# Patient Record
Sex: Female | Born: 1940 | Race: Black or African American | Hispanic: No | Marital: Married | Smoking: Never smoker
Health system: Southern US, Community
[De-identification: ages and names within clinical notes are randomized; demographics above are authoritative.]

## PROBLEM LIST (undated history)

## (undated) DIAGNOSIS — E782 Mixed hyperlipidemia: Secondary | ICD-10-CM

## (undated) DIAGNOSIS — I10 Essential (primary) hypertension: Secondary | ICD-10-CM

## (undated) DIAGNOSIS — I2510 Atherosclerotic heart disease of native coronary artery without angina pectoris: Secondary | ICD-10-CM

## (undated) DIAGNOSIS — I4719 Other supraventricular tachycardia: Secondary | ICD-10-CM

## (undated) DIAGNOSIS — I071 Rheumatic tricuspid insufficiency: Secondary | ICD-10-CM

## (undated) DIAGNOSIS — N184 Chronic kidney disease, stage 4 (severe): Secondary | ICD-10-CM

## (undated) DIAGNOSIS — M899 Disorder of bone, unspecified: Secondary | ICD-10-CM

## (undated) DIAGNOSIS — H409 Unspecified glaucoma: Secondary | ICD-10-CM

## (undated) DIAGNOSIS — M48061 Spinal stenosis, lumbar region without neurogenic claudication: Secondary | ICD-10-CM

## (undated) DIAGNOSIS — D649 Anemia, unspecified: Secondary | ICD-10-CM

## (undated) DIAGNOSIS — I340 Nonrheumatic mitral (valve) insufficiency: Secondary | ICD-10-CM

## (undated) DIAGNOSIS — I480 Paroxysmal atrial fibrillation: Secondary | ICD-10-CM

## (undated) DIAGNOSIS — D631 Anemia in chronic kidney disease: Secondary | ICD-10-CM

## (undated) DIAGNOSIS — J189 Pneumonia, unspecified organism: Secondary | ICD-10-CM

## (undated) DIAGNOSIS — K635 Polyp of colon: Secondary | ICD-10-CM

## (undated) DIAGNOSIS — M949 Disorder of cartilage, unspecified: Secondary | ICD-10-CM

## (undated) DIAGNOSIS — C50311 Malignant neoplasm of lower-inner quadrant of right female breast: Secondary | ICD-10-CM

## (undated) DIAGNOSIS — I5032 Chronic diastolic (congestive) heart failure: Secondary | ICD-10-CM

## (undated) DIAGNOSIS — I38 Endocarditis, valve unspecified: Secondary | ICD-10-CM

## (undated) DIAGNOSIS — I351 Nonrheumatic aortic (valve) insufficiency: Secondary | ICD-10-CM

## (undated) DIAGNOSIS — M069 Rheumatoid arthritis, unspecified: Secondary | ICD-10-CM

## (undated) DIAGNOSIS — I495 Sick sinus syndrome: Secondary | ICD-10-CM

## (undated) DIAGNOSIS — I471 Supraventricular tachycardia: Secondary | ICD-10-CM

## (undated) DIAGNOSIS — N2581 Secondary hyperparathyroidism of renal origin: Secondary | ICD-10-CM

## (undated) DIAGNOSIS — N9489 Other specified conditions associated with female genital organs and menstrual cycle: Secondary | ICD-10-CM

## (undated) DIAGNOSIS — Z7901 Long term (current) use of anticoagulants: Secondary | ICD-10-CM

## (undated) DIAGNOSIS — Z8679 Personal history of other diseases of the circulatory system: Secondary | ICD-10-CM

## (undated) DIAGNOSIS — D696 Thrombocytopenia, unspecified: Secondary | ICD-10-CM

## (undated) DIAGNOSIS — M5416 Radiculopathy, lumbar region: Secondary | ICD-10-CM

## (undated) DIAGNOSIS — I6523 Occlusion and stenosis of bilateral carotid arteries: Secondary | ICD-10-CM

## (undated) HISTORY — DX: Rheumatic tricuspid insufficiency: I07.1

## (undated) HISTORY — PX: CARDIAC CATHETERIZATION: SHX172

## (undated) HISTORY — DX: Unspecified glaucoma: H40.9

## (undated) HISTORY — DX: Other supraventricular tachycardia: I47.19

## (undated) HISTORY — DX: Endocarditis, valve unspecified: I38

## (undated) HISTORY — DX: Nonrheumatic mitral (valve) insufficiency: I34.0

## (undated) HISTORY — DX: Pneumonia, unspecified organism: J18.9

## (undated) HISTORY — DX: Mixed hyperlipidemia: E78.2

## (undated) HISTORY — DX: Sick sinus syndrome: I49.5

## (undated) HISTORY — DX: Disorder of bone, unspecified: M89.9

## (undated) HISTORY — DX: Rheumatoid arthritis, unspecified: M06.9

## (undated) HISTORY — DX: Supraventricular tachycardia: I47.1

## (undated) HISTORY — DX: Atherosclerotic heart disease of native coronary artery without angina pectoris: I25.10

## (undated) HISTORY — PX: OTHER SURGICAL HISTORY: SHX169

## (undated) HISTORY — DX: Nonrheumatic aortic (valve) insufficiency: I35.1

## (undated) HISTORY — DX: Paroxysmal atrial fibrillation: I48.0

## (undated) HISTORY — DX: Essential (primary) hypertension: I10

## (undated) HISTORY — DX: Anemia, unspecified: D64.9

## (undated) HISTORY — DX: Disorder of cartilage, unspecified: M94.9

## (undated) HISTORY — PX: ABLATION OF DYSRHYTHMIC FOCUS: SHX254

## (undated) MED FILL — Ferumoxytol Inj 510 MG/17ML (30 MG/ML) (Elemental Fe): INTRAVENOUS | Qty: 17 | Status: AC

---

## 1991-11-27 ENCOUNTER — Encounter (INDEPENDENT_AMBULATORY_CARE_PROVIDER_SITE_OTHER): Payer: Self-pay | Admitting: Internal Medicine

## 2005-01-28 ENCOUNTER — Ambulatory Visit: Payer: Self-pay | Admitting: Family Medicine

## 2005-02-03 ENCOUNTER — Ambulatory Visit: Payer: Self-pay | Admitting: Family Medicine

## 2005-02-10 ENCOUNTER — Ambulatory Visit: Payer: Self-pay | Admitting: Family Medicine

## 2005-02-10 ENCOUNTER — Other Ambulatory Visit: Admission: RE | Admit: 2005-02-10 | Discharge: 2005-02-10 | Payer: Self-pay | Admitting: Internal Medicine

## 2005-03-04 ENCOUNTER — Ambulatory Visit: Payer: Self-pay | Admitting: Family Medicine

## 2005-03-09 ENCOUNTER — Ambulatory Visit: Payer: Self-pay | Admitting: Family Medicine

## 2005-04-08 ENCOUNTER — Ambulatory Visit: Payer: Self-pay | Admitting: Family Medicine

## 2005-07-11 ENCOUNTER — Ambulatory Visit: Payer: Self-pay | Admitting: Family Medicine

## 2005-08-10 ENCOUNTER — Ambulatory Visit: Payer: Self-pay | Admitting: Family Medicine

## 2005-08-12 ENCOUNTER — Encounter (INDEPENDENT_AMBULATORY_CARE_PROVIDER_SITE_OTHER): Payer: Self-pay | Admitting: Internal Medicine

## 2005-11-24 ENCOUNTER — Ambulatory Visit: Payer: Self-pay | Admitting: Family Medicine

## 2005-12-08 ENCOUNTER — Ambulatory Visit: Payer: Self-pay | Admitting: Family Medicine

## 2006-02-16 ENCOUNTER — Ambulatory Visit: Payer: Self-pay | Admitting: Family Medicine

## 2006-06-30 ENCOUNTER — Ambulatory Visit: Payer: Self-pay | Admitting: Family Medicine

## 2006-06-30 ENCOUNTER — Other Ambulatory Visit: Admission: RE | Admit: 2006-06-30 | Discharge: 2006-06-30 | Payer: Self-pay | Admitting: Family Medicine

## 2006-07-05 ENCOUNTER — Ambulatory Visit: Payer: Self-pay | Admitting: Family Medicine

## 2006-09-12 ENCOUNTER — Ambulatory Visit: Payer: Self-pay

## 2006-10-10 DIAGNOSIS — H409 Unspecified glaucoma: Secondary | ICD-10-CM | POA: Insufficient documentation

## 2007-01-08 ENCOUNTER — Ambulatory Visit: Payer: Self-pay | Admitting: Family Medicine

## 2007-08-23 ENCOUNTER — Ambulatory Visit: Payer: Self-pay | Admitting: Family Medicine

## 2007-08-23 ENCOUNTER — Encounter (INDEPENDENT_AMBULATORY_CARE_PROVIDER_SITE_OTHER): Payer: Self-pay | Admitting: Internal Medicine

## 2007-08-23 DIAGNOSIS — E782 Mixed hyperlipidemia: Secondary | ICD-10-CM | POA: Insufficient documentation

## 2007-08-28 LAB — CONVERTED CEMR LAB
ALT: 15 units/L (ref 0–35)
BUN: 16 mg/dL (ref 6–23)
Bilirubin, Direct: 0.1 mg/dL (ref 0.0–0.3)
CO2: 28 meq/L (ref 19–32)
Calcium: 10 mg/dL (ref 8.4–10.5)
Direct LDL: 147.5 mg/dL
Eosinophils Absolute: 0.1 10*3/uL (ref 0.0–0.6)
Eosinophils Relative: 2.3 % (ref 0.0–5.0)
GFR calc Af Amer: 93 mL/min
GFR calc non Af Amer: 77 mL/min
Glucose, Bld: 96 mg/dL (ref 70–99)
Hemoglobin: 11.5 g/dL — ABNORMAL LOW (ref 12.0–15.0)
Lymphocytes Relative: 35.6 % (ref 12.0–46.0)
MCV: 91.2 fL (ref 78.0–100.0)
Monocytes Absolute: 0.4 10*3/uL (ref 0.2–0.7)
Monocytes Relative: 6.5 % (ref 3.0–11.0)
Neutro Abs: 3.6 10*3/uL (ref 1.4–7.7)
Platelets: 177 10*3/uL (ref 150–400)
Potassium: 4 meq/L (ref 3.5–5.1)
TSH: 1.32 microintl units/mL (ref 0.35–5.50)
Total CHOL/HDL Ratio: 3.5
Total Protein: 7.8 g/dL (ref 6.0–8.3)
Triglycerides: 70 mg/dL (ref 0–149)
VLDL: 14 mg/dL (ref 0–40)
Vit D, 1,25-Dihydroxy: 27 — ABNORMAL LOW (ref 30–89)
WBC: 6.3 10*3/uL (ref 4.5–10.5)

## 2007-09-14 ENCOUNTER — Ambulatory Visit: Payer: Self-pay | Admitting: Family Medicine

## 2007-10-11 DIAGNOSIS — J189 Pneumonia, unspecified organism: Secondary | ICD-10-CM

## 2007-10-11 HISTORY — DX: Pneumonia, unspecified organism: J18.9

## 2007-10-25 ENCOUNTER — Ambulatory Visit: Payer: Self-pay | Admitting: Family Medicine

## 2007-10-25 ENCOUNTER — Encounter (INDEPENDENT_AMBULATORY_CARE_PROVIDER_SITE_OTHER): Payer: Self-pay | Admitting: Internal Medicine

## 2007-10-29 ENCOUNTER — Encounter (INDEPENDENT_AMBULATORY_CARE_PROVIDER_SITE_OTHER): Payer: Self-pay | Admitting: Internal Medicine

## 2007-10-29 ENCOUNTER — Ambulatory Visit: Payer: Self-pay | Admitting: Family Medicine

## 2007-10-30 ENCOUNTER — Encounter (INDEPENDENT_AMBULATORY_CARE_PROVIDER_SITE_OTHER): Payer: Self-pay | Admitting: *Deleted

## 2007-11-01 ENCOUNTER — Encounter (INDEPENDENT_AMBULATORY_CARE_PROVIDER_SITE_OTHER): Payer: Self-pay | Admitting: *Deleted

## 2007-11-16 ENCOUNTER — Telehealth (INDEPENDENT_AMBULATORY_CARE_PROVIDER_SITE_OTHER): Payer: Self-pay | Admitting: Internal Medicine

## 2007-11-20 ENCOUNTER — Ambulatory Visit: Payer: Self-pay | Admitting: Cardiovascular Disease

## 2007-11-20 ENCOUNTER — Inpatient Hospital Stay (HOSPITAL_COMMUNITY): Admission: EM | Admit: 2007-11-20 | Discharge: 2007-11-23 | Payer: Self-pay | Admitting: Emergency Medicine

## 2007-11-20 IMAGING — CR DG CERVICAL SPINE FLEX&EXT ONLY
3 series · 3 of 3 positions shown · non-contrast
Comparison: none

CLINICAL DATA: Neck pain. Evaluate for subluxation at C1-2. 
 CERVICAL SPINE FLEXION AND EXTENSION ? 3 VIEW:

[w c-spine oblique (1 of 2)]
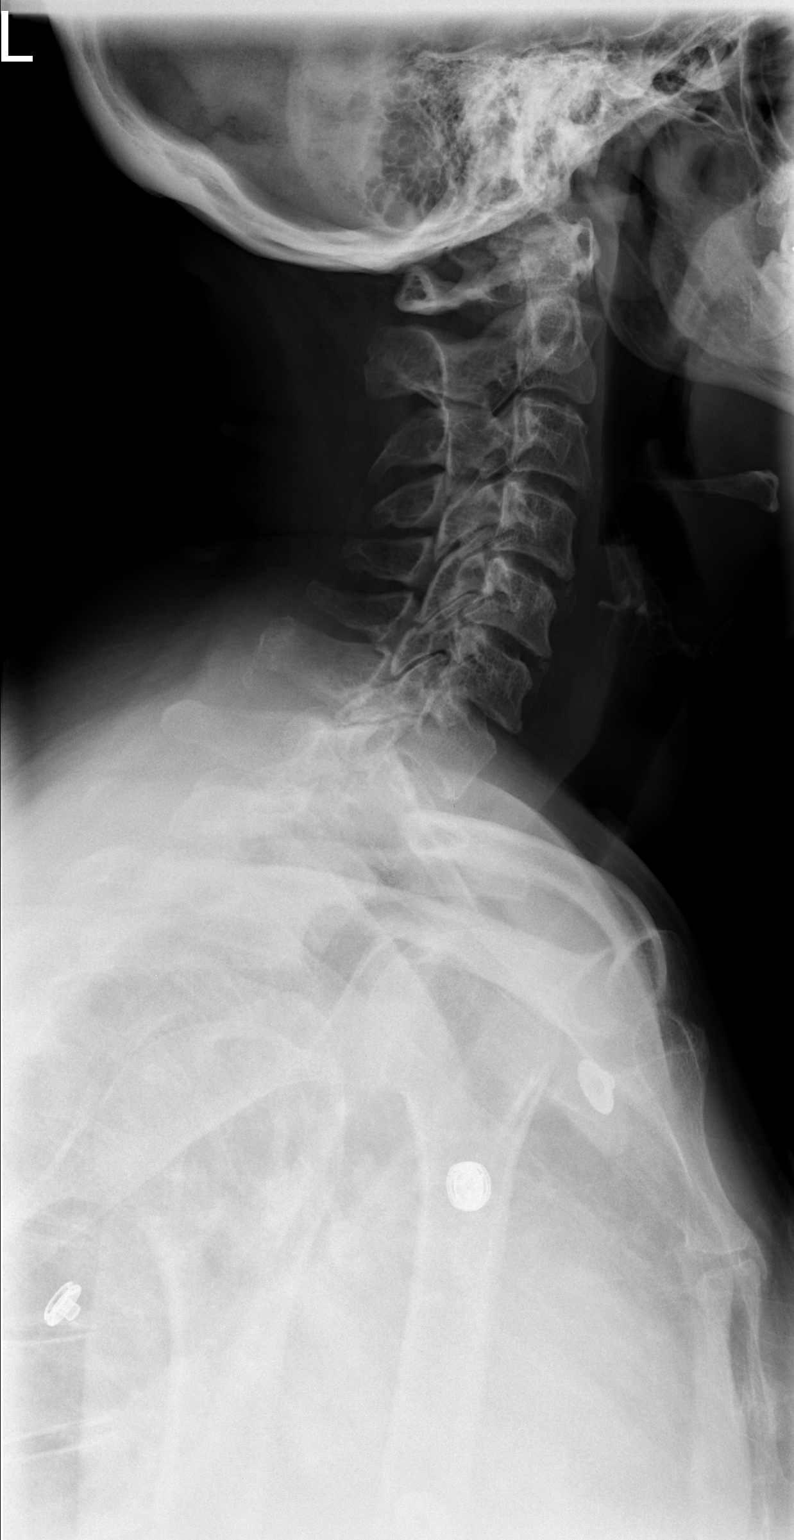

[w c-spine oblique (2 of 2)]
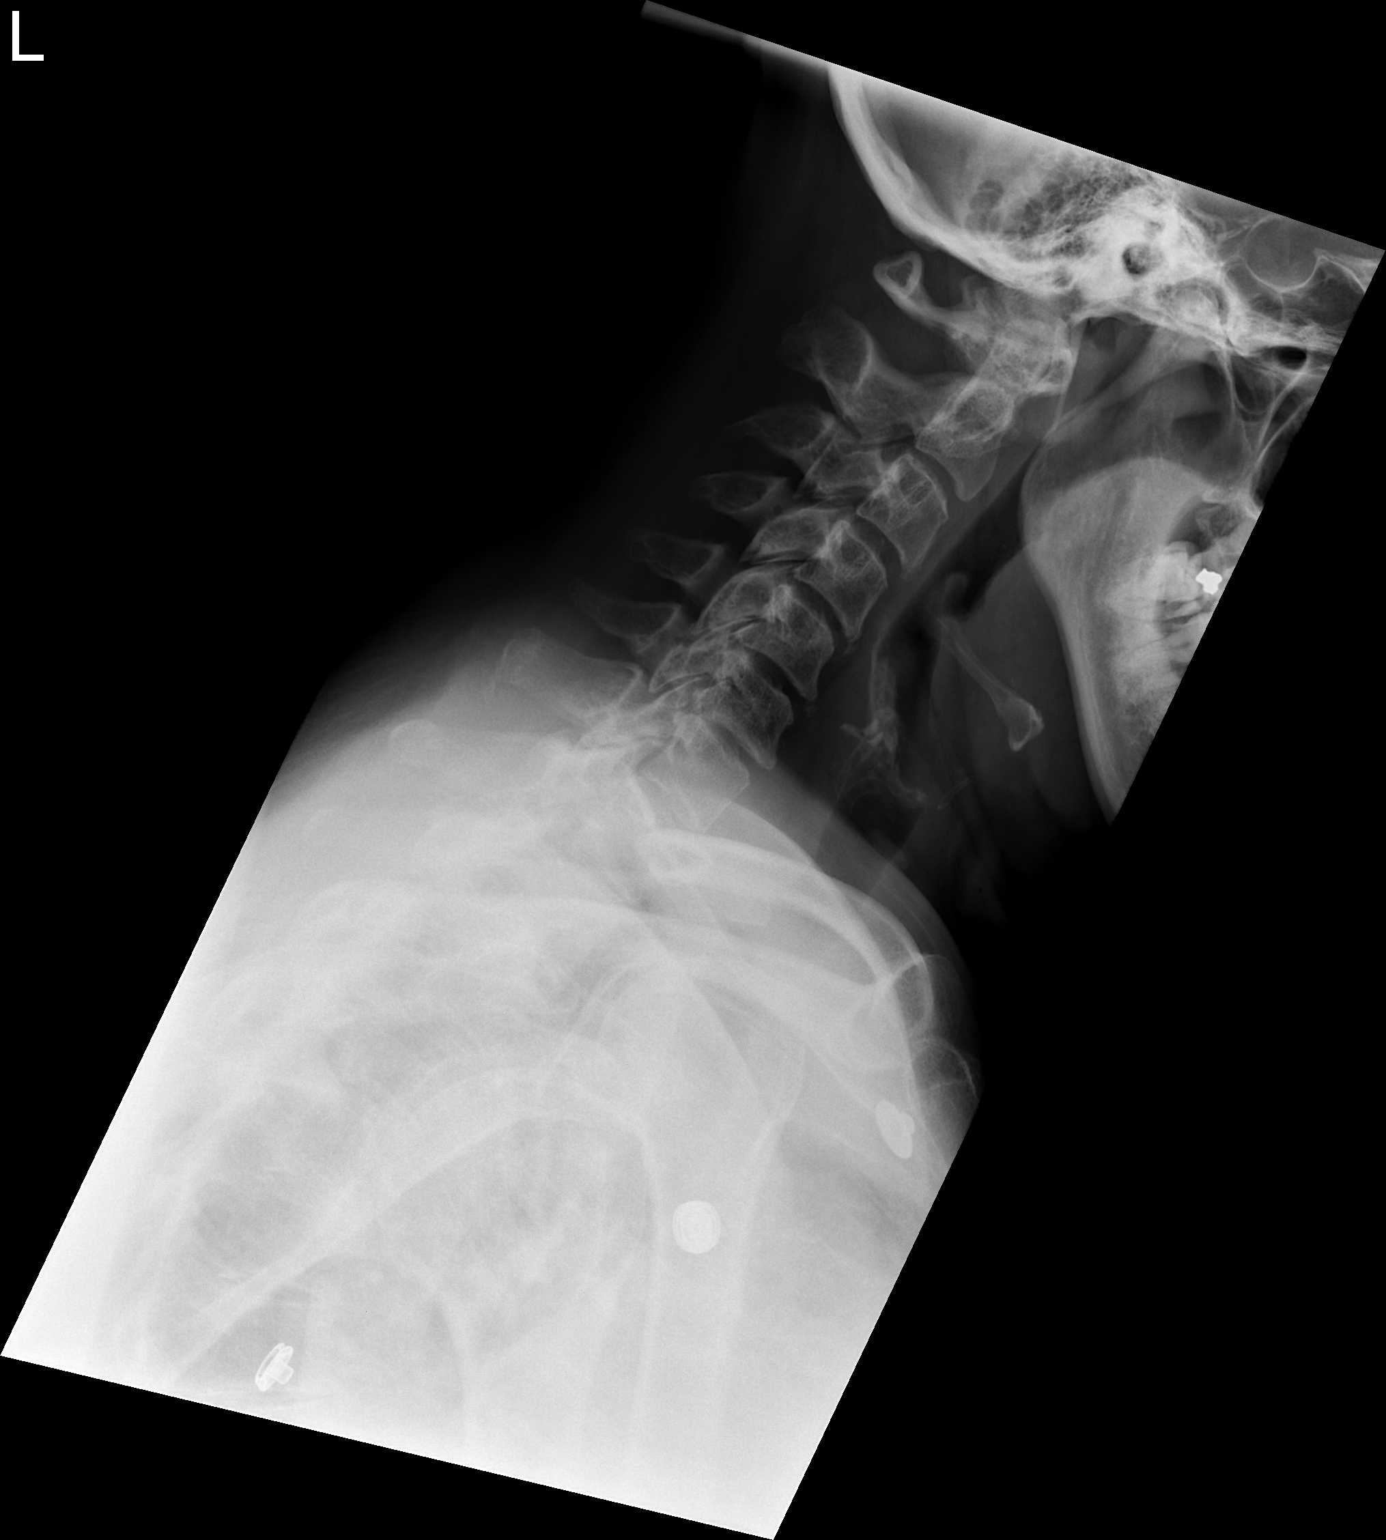

[w c-spine a.p.]
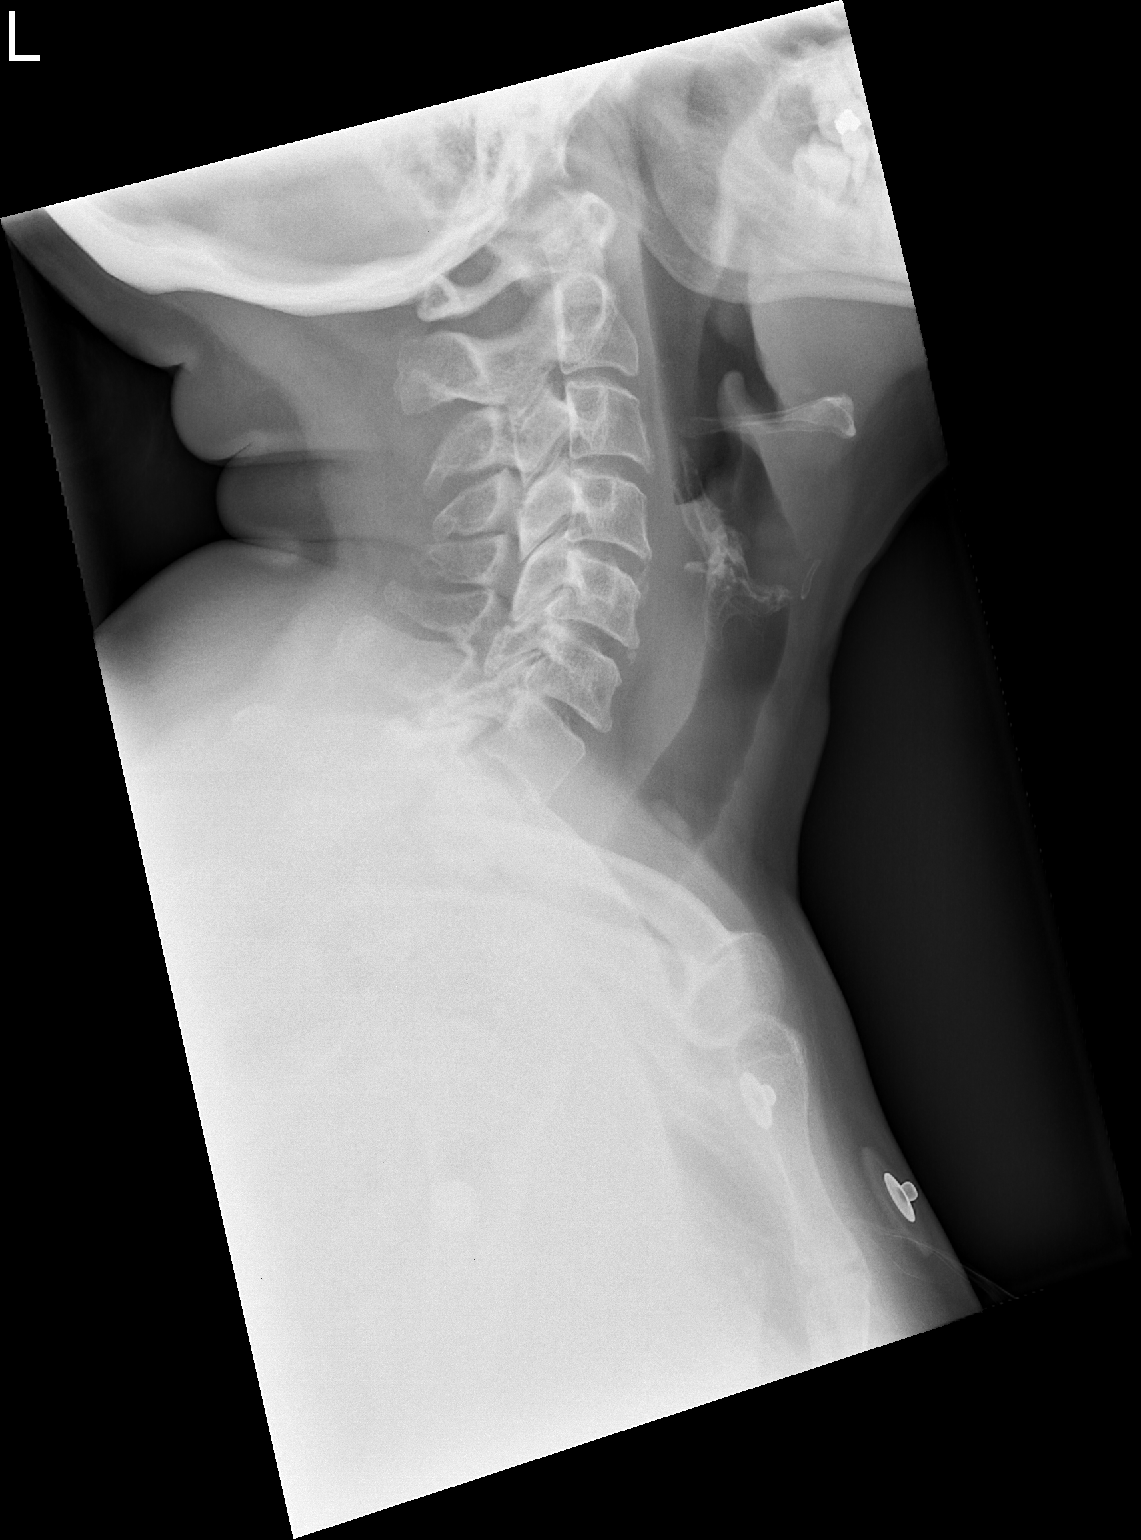

[3 of 3 positions shown; findings below may reference images not displayed]

FINDINGS: There is no subluxation at any level.  There is decreased range of motion. Mild degenerative osteophytic formation.
IMPRESSION: No subluxation. Decreased range of motion.

## 2007-11-20 IMAGING — CR DG CHEST 1V PORT
1 series · 1 of 1 positions shown · non-contrast
Comparison: None

CLINICAL DATA: Chest pain. Dyspnea.

CHEST - 1 VIEW

[AP]
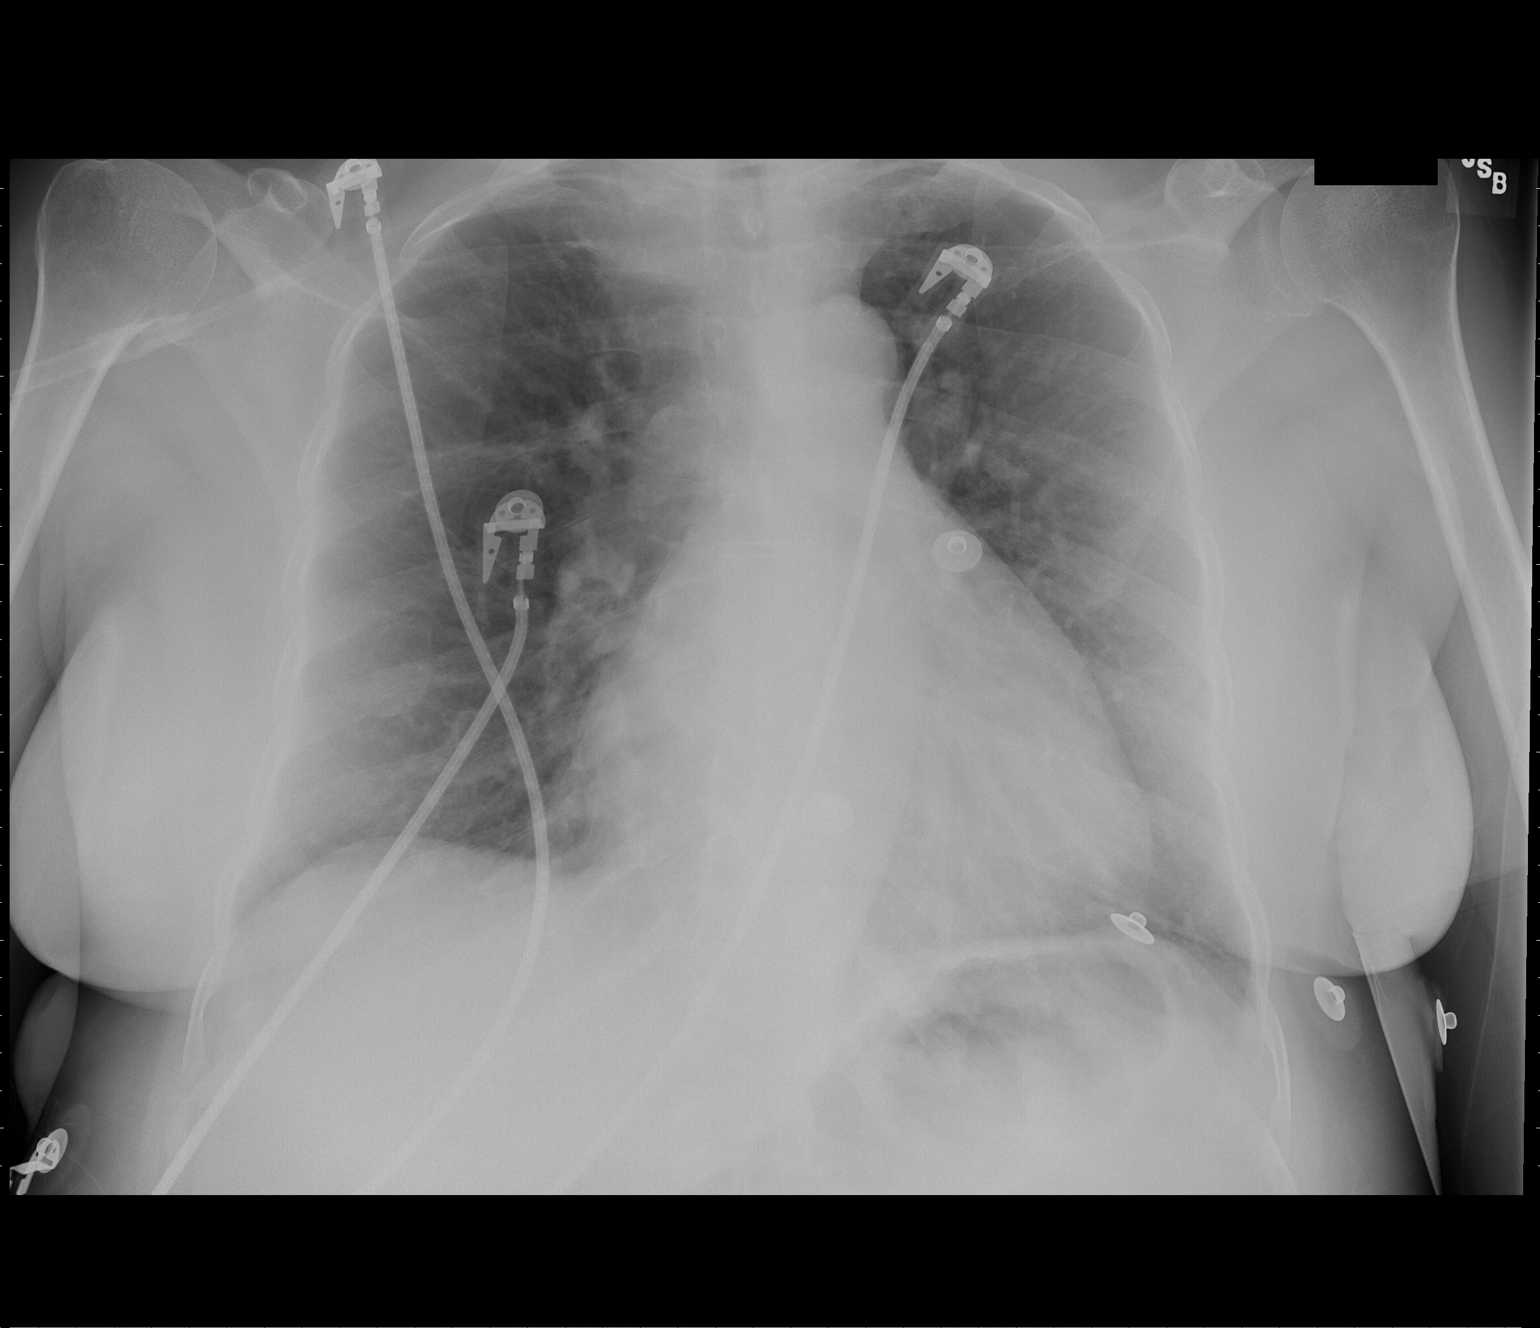

[1 of 1 positions shown; findings below may reference images not displayed]

FINDINGS: Midline trachea. Mild cardiomegaly. No pleural effusion or
pneumothorax. Pulmonary interstitial prominence felt to be due to AP portable
technique. Linear opacity radiates from the right hilum. Left lung clear.

IMPRESSION

1. Mild cardiomegaly but no acute cardiopulmonary disease. 
2. linear opacity in the right upper lobe. Likely scar or atelectasis. If there
are prior radiographs for comparison, these would be useful. If not, consider
further evaluation with PA and lateral radiographs.

## 2007-11-20 IMAGING — CR DG CHEST 2V
2 series · 2 of 2 positions shown · non-contrast
Comparison: none

CLINICAL DATA: Followup linear density noted on portable exam today.
CHEST - 2 VIEW:

[w chest pa]
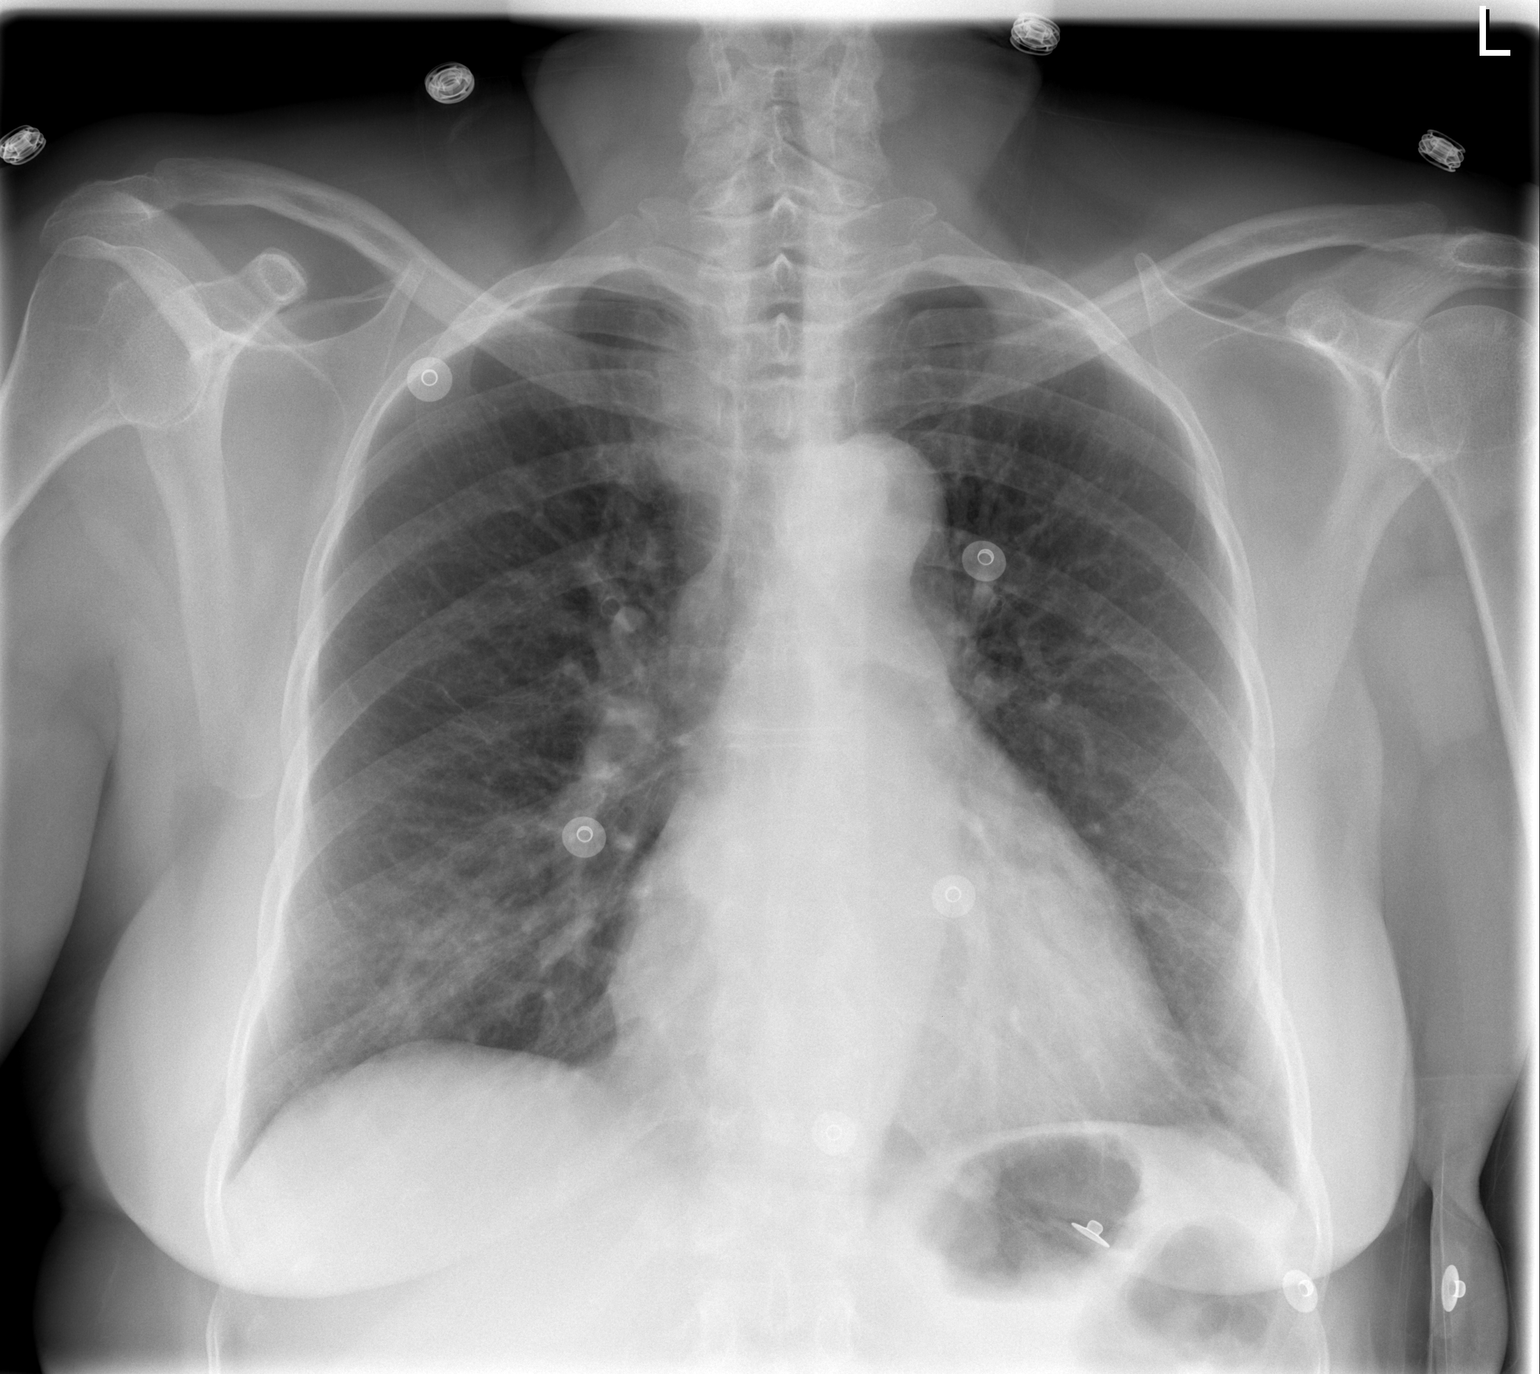

[w chest lat]
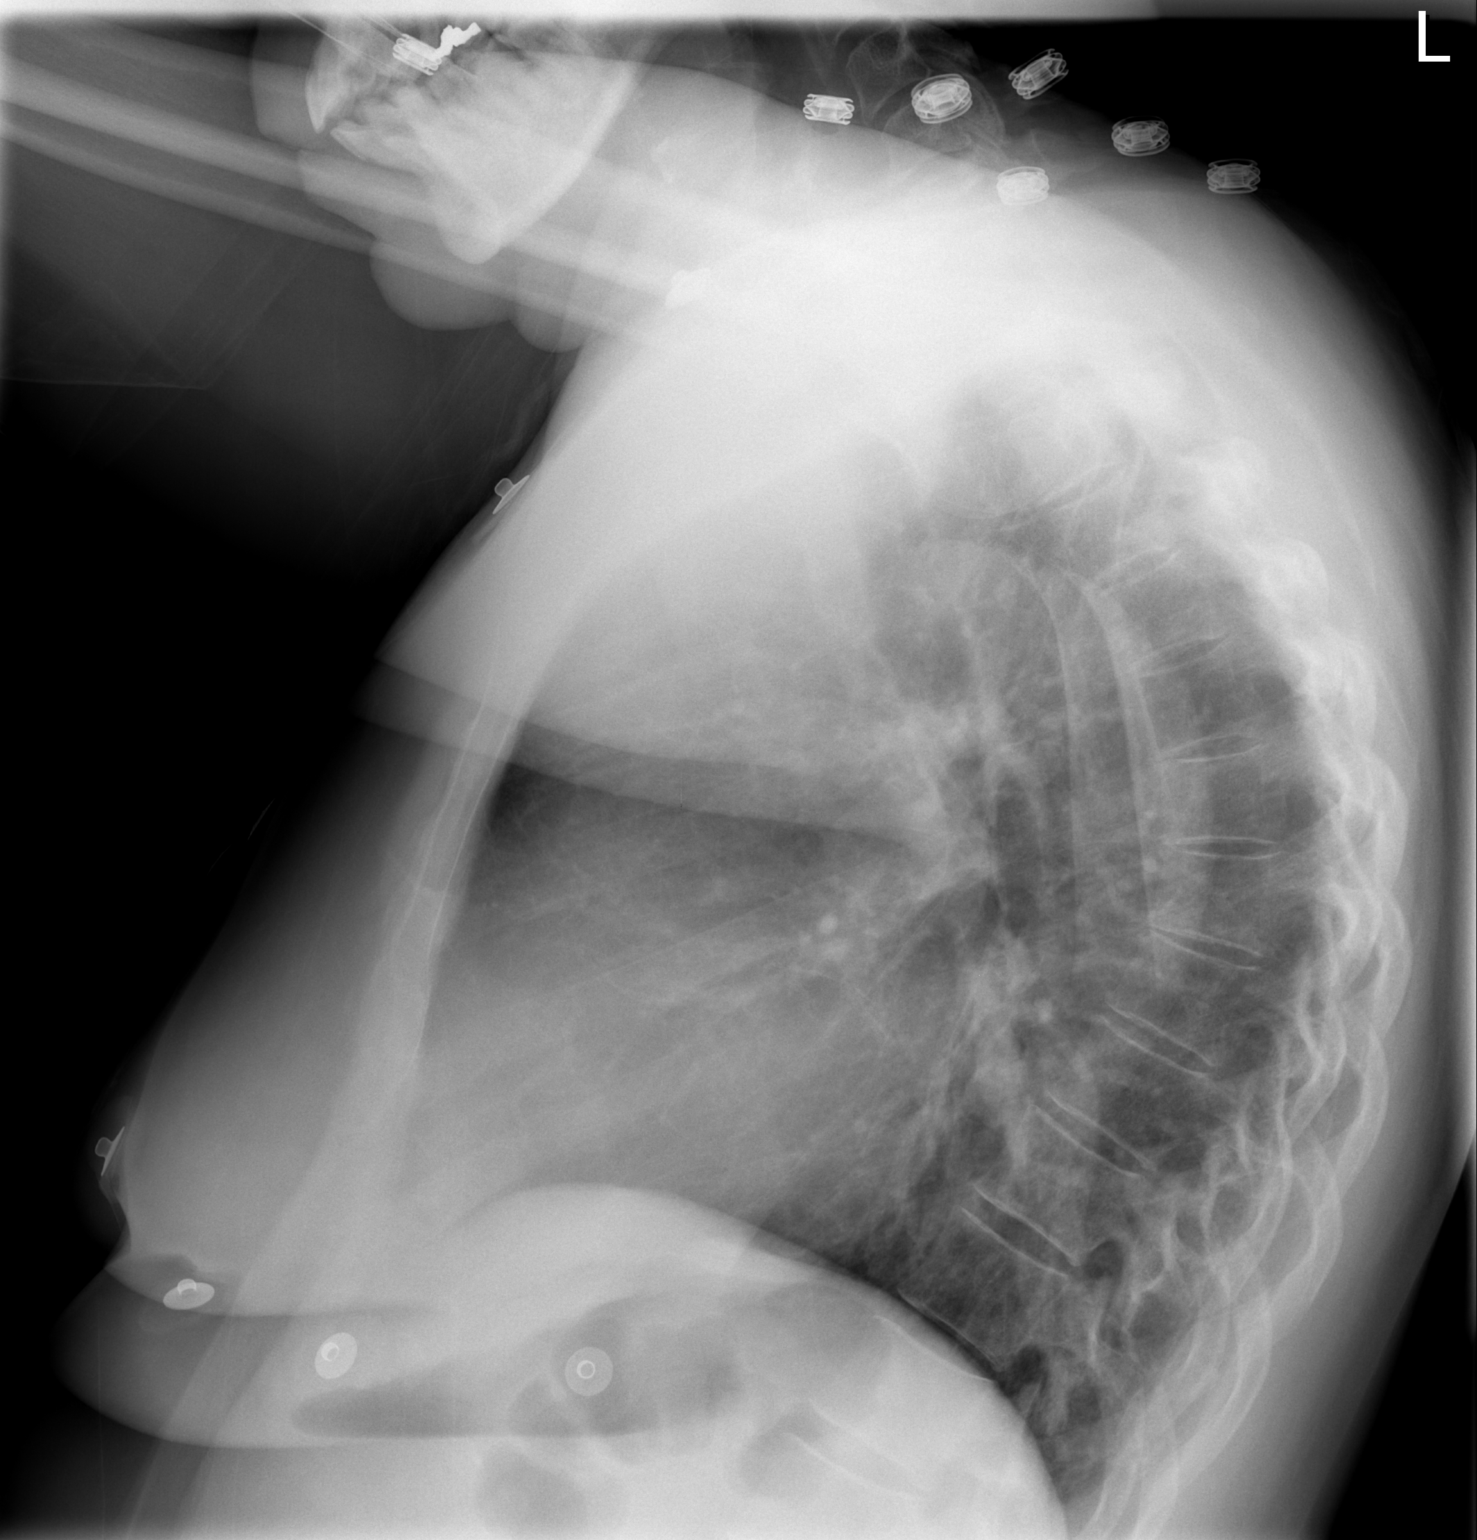

[2 of 2 positions shown; findings below may reference images not displayed]

FINDINGS: Specifically, there is no evidence of a linear opacity in the right upper lung zone as questioned on earlier portable view.  There is cardiomegaly and pulmonary vascular congestion without frank edema.  No pleural effusions.
IMPRESSION: Cardiomegaly and moderate vascular congestion/pulmonary venous hypertension.  No evidence of an abnormal linear opacity in the right upper lung zone.

## 2007-11-22 ENCOUNTER — Encounter: Payer: Self-pay | Admitting: Cardiovascular Disease

## 2007-11-23 ENCOUNTER — Encounter (INDEPENDENT_AMBULATORY_CARE_PROVIDER_SITE_OTHER): Payer: Self-pay | Admitting: Internal Medicine

## 2007-11-30 ENCOUNTER — Ambulatory Visit: Payer: Self-pay | Admitting: Internal Medicine

## 2007-11-30 LAB — CONVERTED CEMR LAB
Chloride: 109 meq/L (ref 96–112)
GFR calc Af Amer: 64 mL/min
GFR calc non Af Amer: 53 mL/min
Glucose, Bld: 97 mg/dL (ref 70–99)
Potassium: 4.2 meq/L (ref 3.5–5.1)
Sodium: 144 meq/L (ref 135–145)

## 2007-12-03 ENCOUNTER — Ambulatory Visit: Payer: Self-pay

## 2007-12-06 ENCOUNTER — Ambulatory Visit: Payer: Self-pay | Admitting: Internal Medicine

## 2007-12-09 ENCOUNTER — Ambulatory Visit: Payer: Self-pay | Admitting: Cardiology

## 2007-12-09 ENCOUNTER — Inpatient Hospital Stay (HOSPITAL_COMMUNITY): Admission: EM | Admit: 2007-12-09 | Discharge: 2007-12-12 | Payer: Self-pay | Admitting: Emergency Medicine

## 2007-12-09 IMAGING — CR DG CHEST 1V PORT
1 series · 1 of 1 positions shown · non-contrast
Comparison: [DATE].

CLINICAL DATA: Chest pain.
 PORTABLE CHEST - 1 VIEW:

[AP]
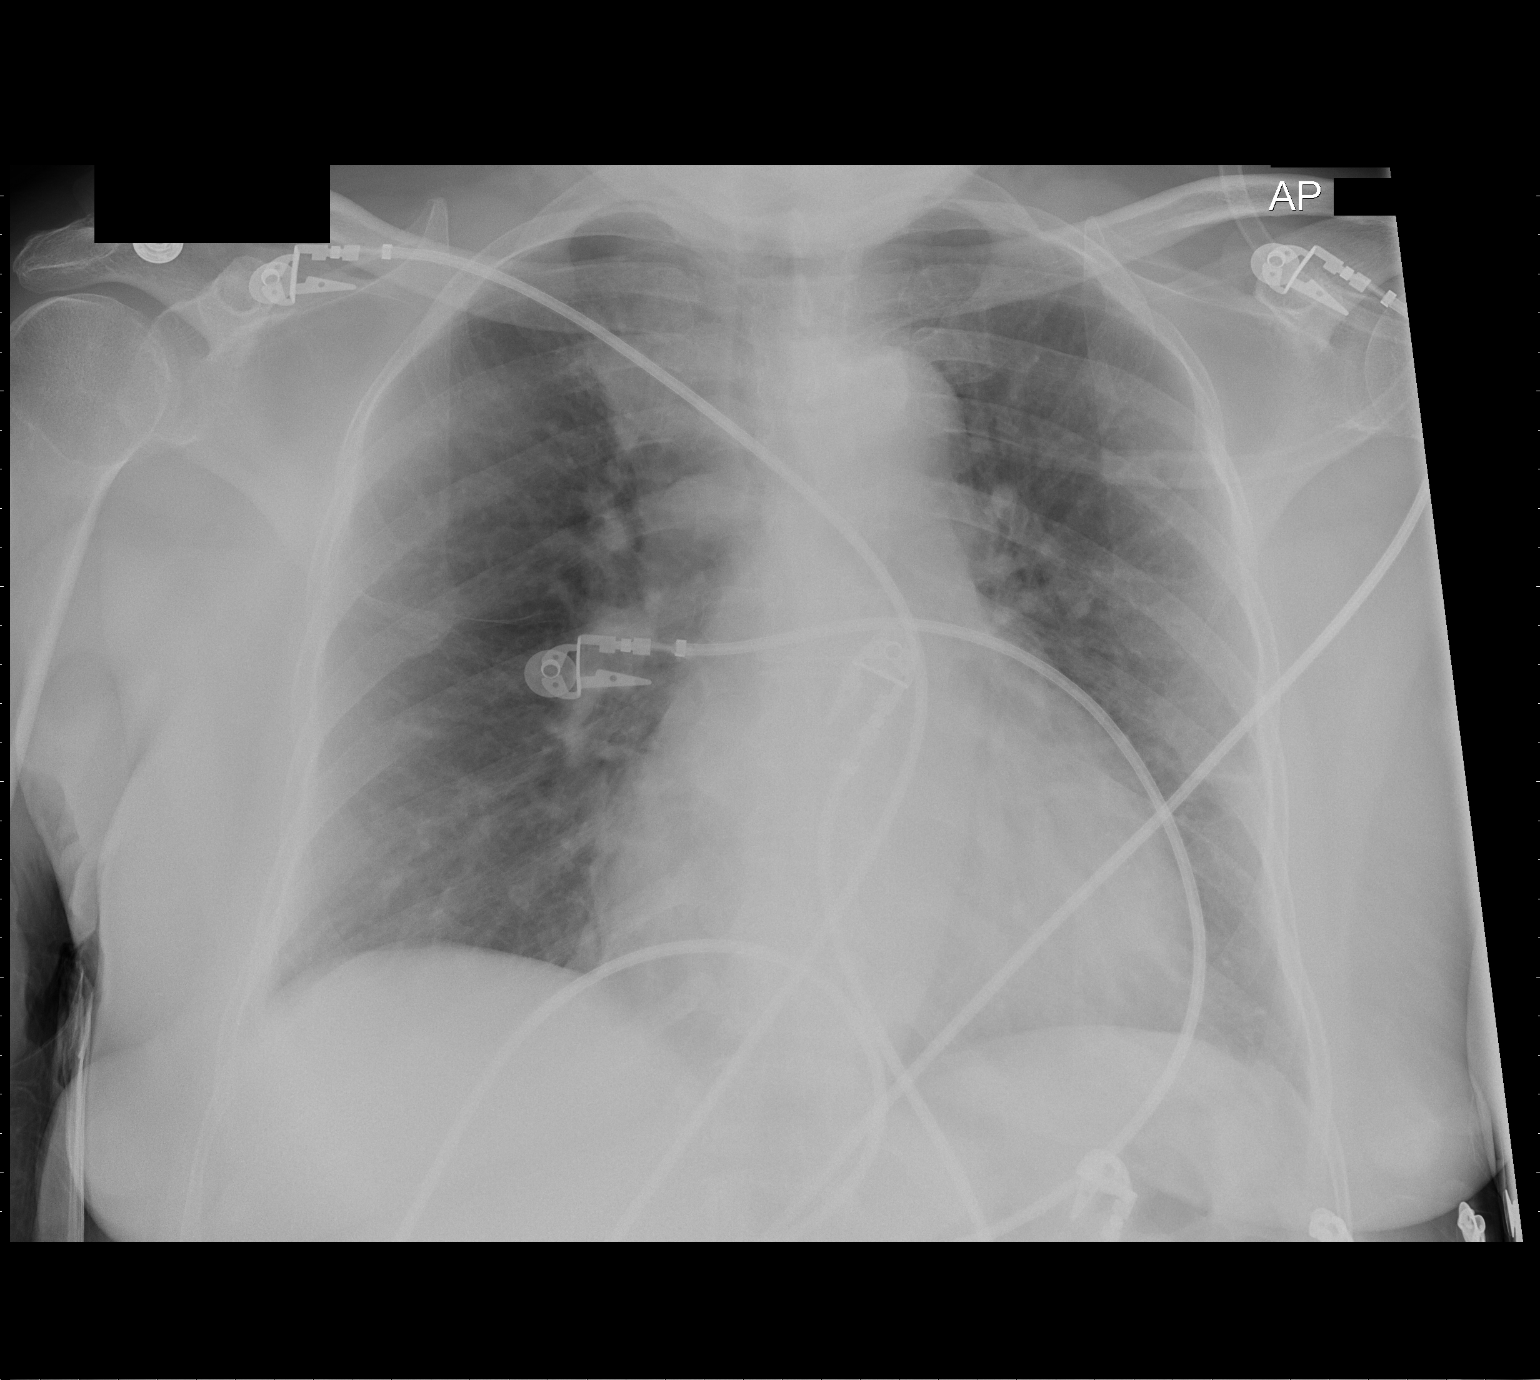

[1 of 1 positions shown; findings below may reference images not displayed]

FINDINGS: Portable upright view of the chest demonstrates mild prominence of the interstitial markings, probably related to chronic changes.  No focal disease.  Soft tissue along the right paratracheal region appears stable and probably vascular in nature.  Heart size is stable.
IMPRESSION: Minimal change from the prior exams.  No significant pulmonary edema.

## 2007-12-13 ENCOUNTER — Ambulatory Visit: Payer: Self-pay | Admitting: Family Medicine

## 2007-12-13 ENCOUNTER — Ambulatory Visit: Payer: Self-pay | Admitting: Internal Medicine

## 2007-12-13 ENCOUNTER — Inpatient Hospital Stay (HOSPITAL_COMMUNITY): Admission: EM | Admit: 2007-12-13 | Discharge: 2007-12-21 | Payer: Self-pay | Admitting: Emergency Medicine

## 2007-12-13 ENCOUNTER — Encounter: Payer: Self-pay | Admitting: Family Medicine

## 2007-12-13 ENCOUNTER — Ambulatory Visit: Payer: Self-pay | Admitting: Cardiology

## 2007-12-13 DIAGNOSIS — D638 Anemia in other chronic diseases classified elsewhere: Secondary | ICD-10-CM | POA: Insufficient documentation

## 2007-12-13 DIAGNOSIS — D509 Iron deficiency anemia, unspecified: Secondary | ICD-10-CM

## 2007-12-13 IMAGING — CT CT ANGIO CHEST
2 of 6 series · 19 of 36 positions shown · IV contrast (APPLIED)
Comparison: none

CLINICAL DATA: Chest pain and dyspnea.  
CT ANGIOGRAPHY OF CHEST WITH CONTRAST ? [DATE]:
TECHNIQUE: Multidetector CT imaging of the chest was performed during bolus injection of intravenous contrast.  Multiplanar CT angiographic image reconstructions were generated to evaluate the vascular anatomy. 
Contrast:  100 cc Omnipaque 300 IV. 
No comparison films available.

[Series 5: pulm embolism 2.0 b31f st · axial · 0.64mm/px · z∈[-212,+22]mm · 18 of 127 slices shown]
[im 5/127  lung]
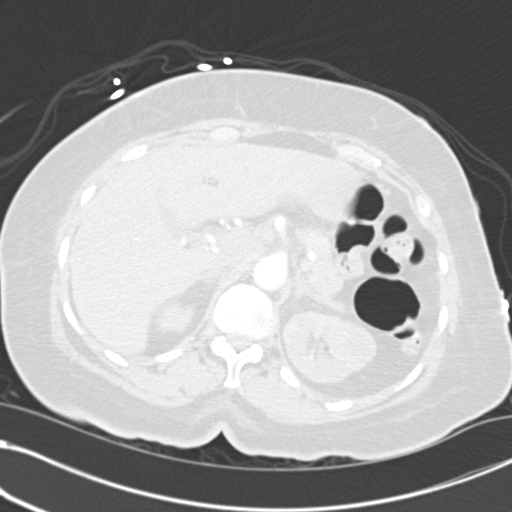
[im 15/127  mediastinal]
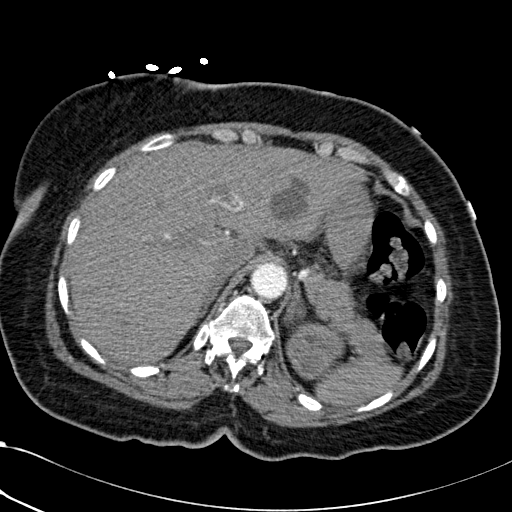
[im 20/127  lung]
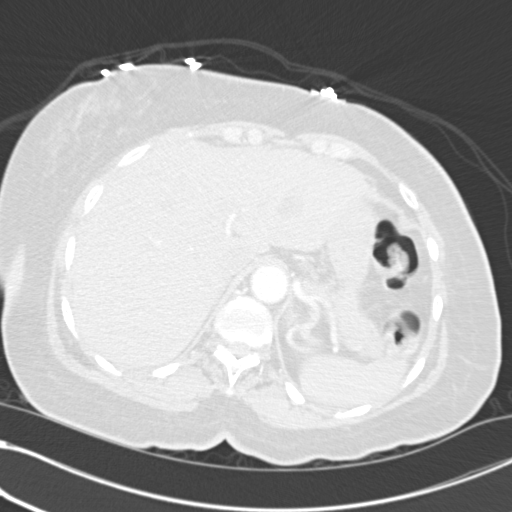
[im 25/127  mediastinal]
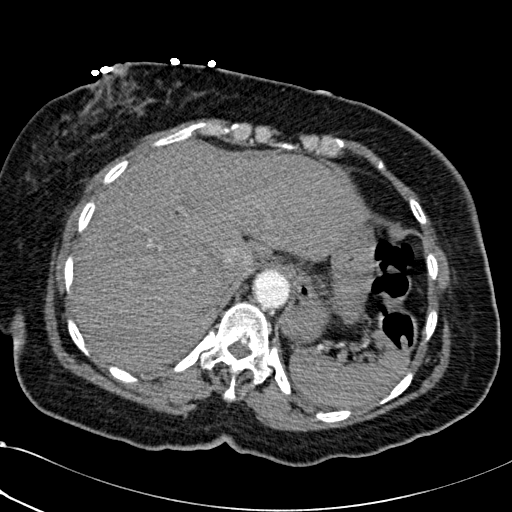
[im 34/127  lung]
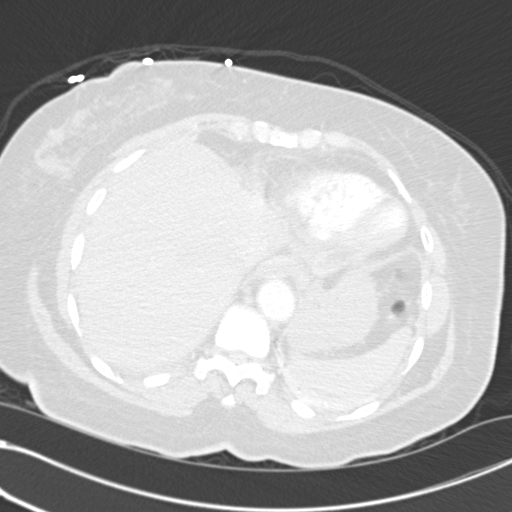
[im 39/127  mediastinal]
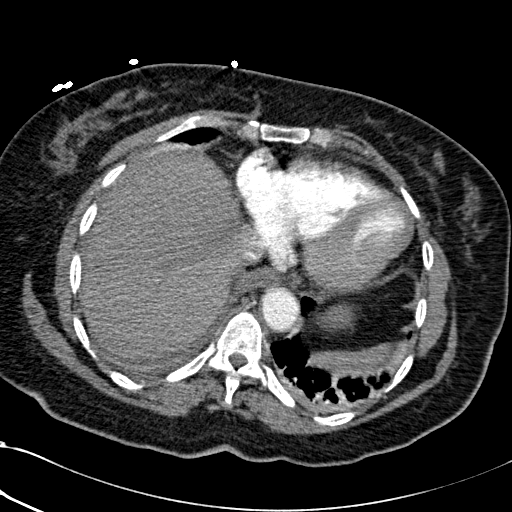
[im 49/127  lung]
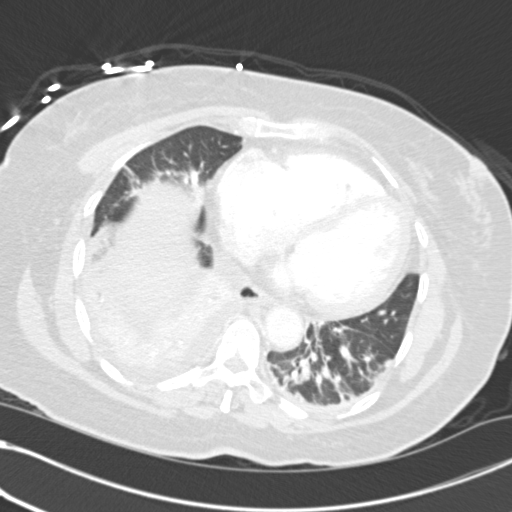
[im 54/127  mediastinal]
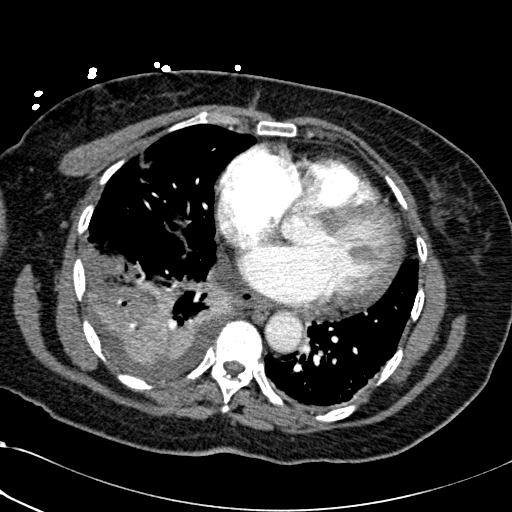
[im 59/127  lung]
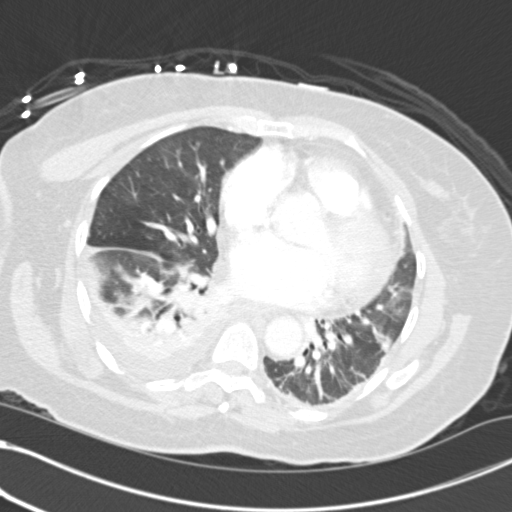
[im 68/127  mediastinal]
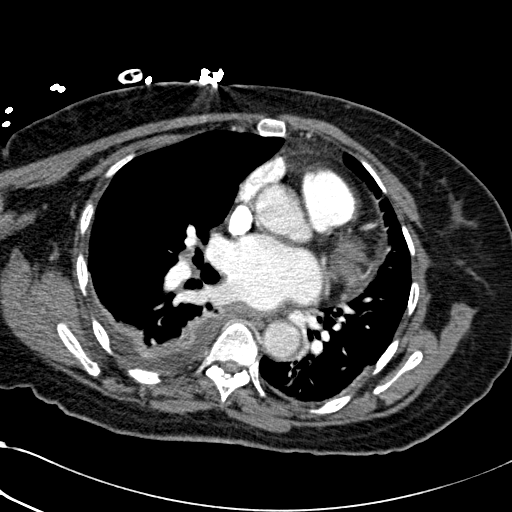
[im 73/127  lung]
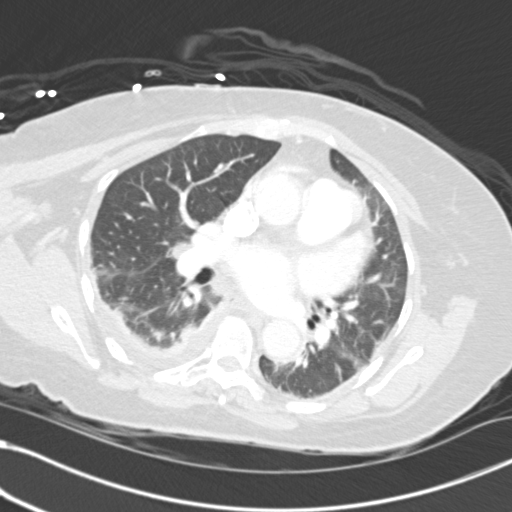
[im 78/127  mediastinal]
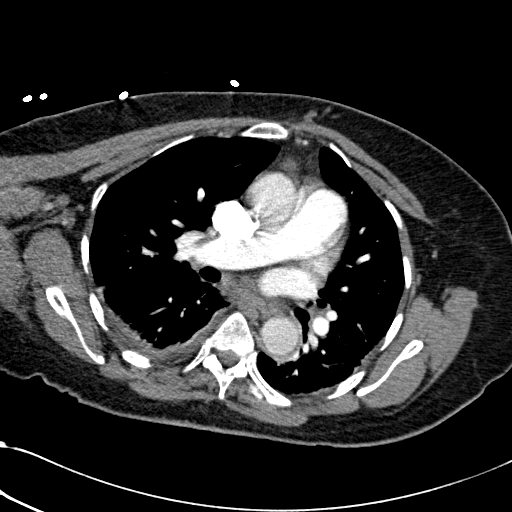
[im 88/127  lung]
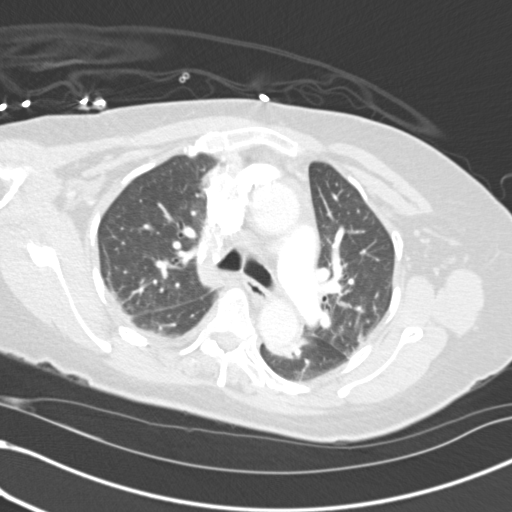
[im 93/127  mediastinal]
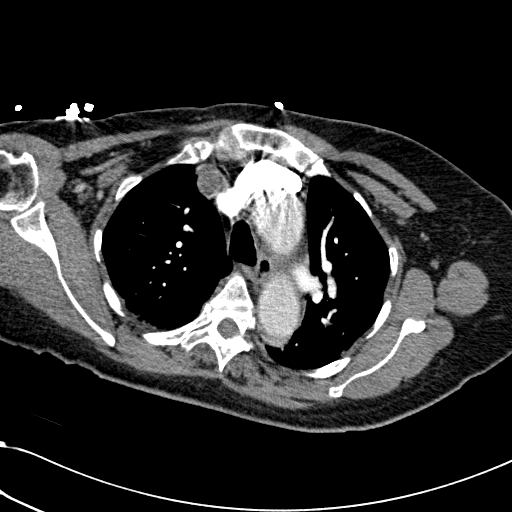
[im 102/127  lung]
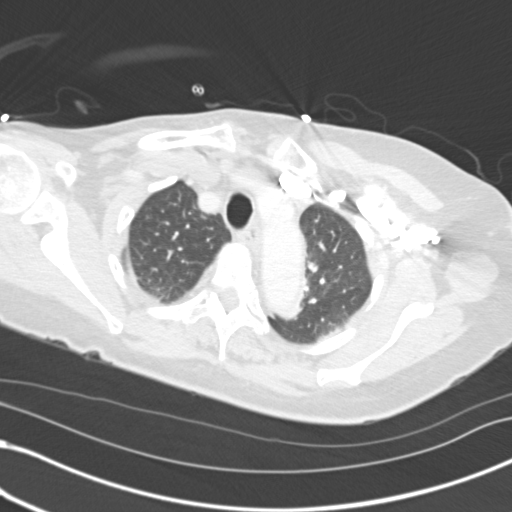
[im 107/127  mediastinal]
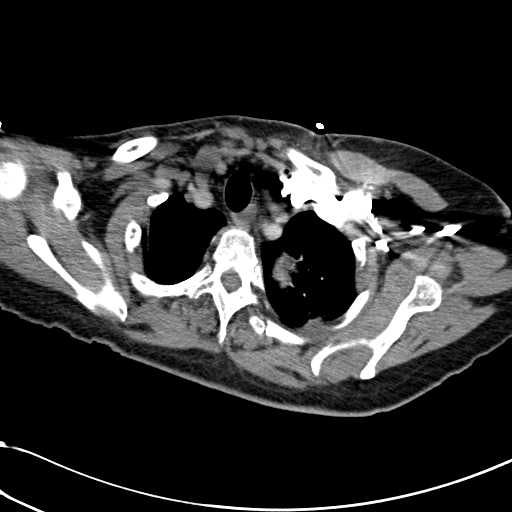
[im 112/127  lung]
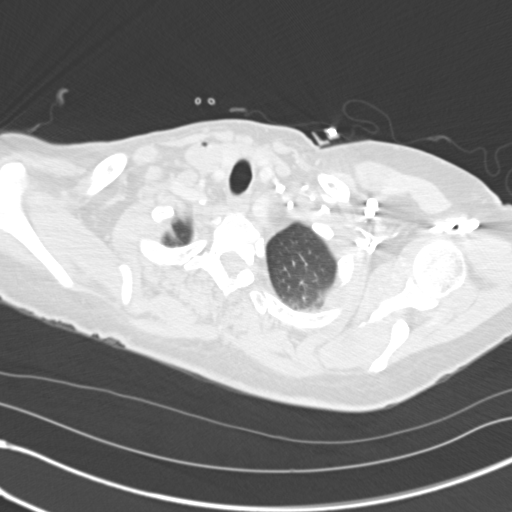
[im 122/127  mediastinal]
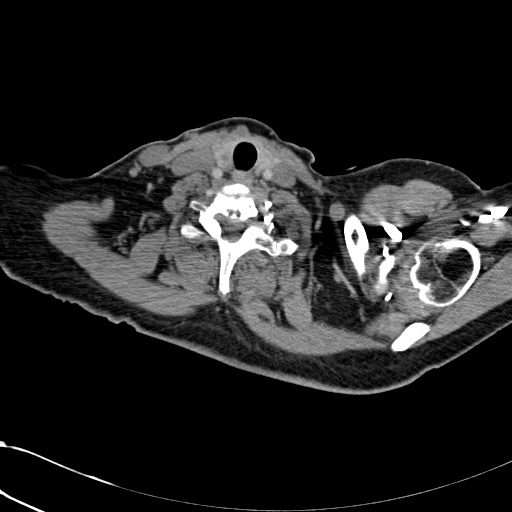

[Series 602: coronals · coronal · 0.64mm/px · 1 of 53 slices shown]
[im 27/53  mediastinal]
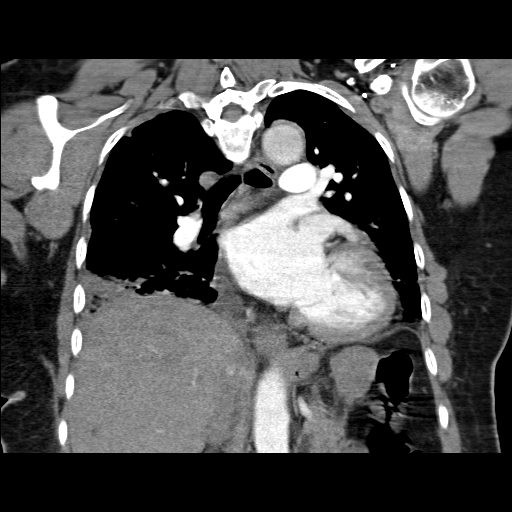

[19 of 36 positions shown; findings below may reference images not displayed]

FINDINGS: This is a technically satisfactory study.
There are no filling defects identified in the pulmonary arterial system to suggest pulmonary emboli.  Cardiomegaly is identified without thoracic aortic aneurysm.  Moderate right lower lobe atelectasis and possibly consolidation are noted.  Mild to moderate left basilar atelectasis is present.  
Probable cysts within the kidneys are noted.  Renal cortical thinning is identified.
IMPRESSION: 1.  No evidence of pulmonary emboli or thoracic aortic aneurysm.
2.  Moderate right lower lobe atelectasis/consolidation and mild to moderate left basilar atelectasis.

## 2007-12-16 IMAGING — CR DG CHEST 2V
2 series · 2 of 2 positions shown · non-contrast
Comparison: [DATE].

CLINICAL DATA: 66 year old female; pneumonia, chest pain.
CHEST - 2 VIEW - [DATE]:

[w chest lat]
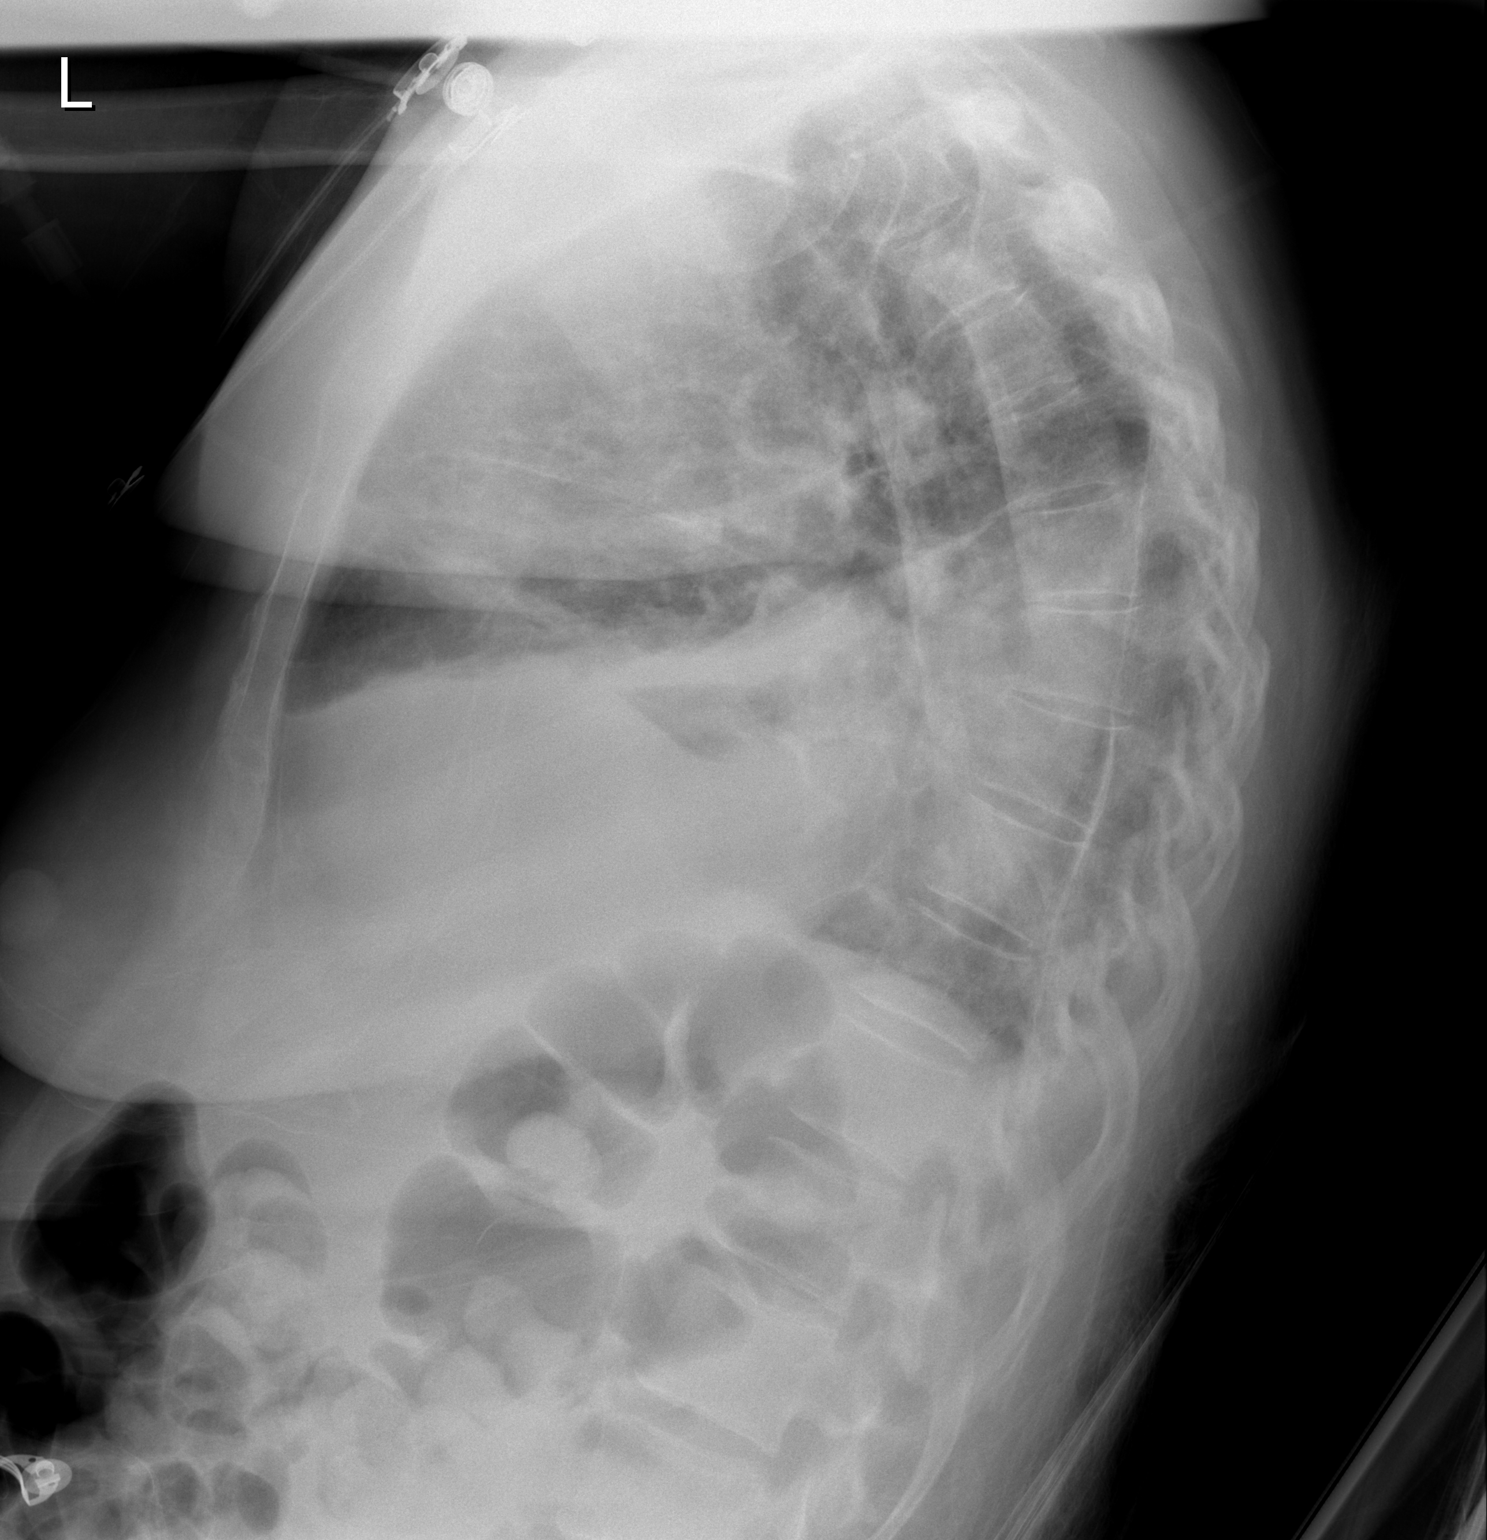

[view not recorded]
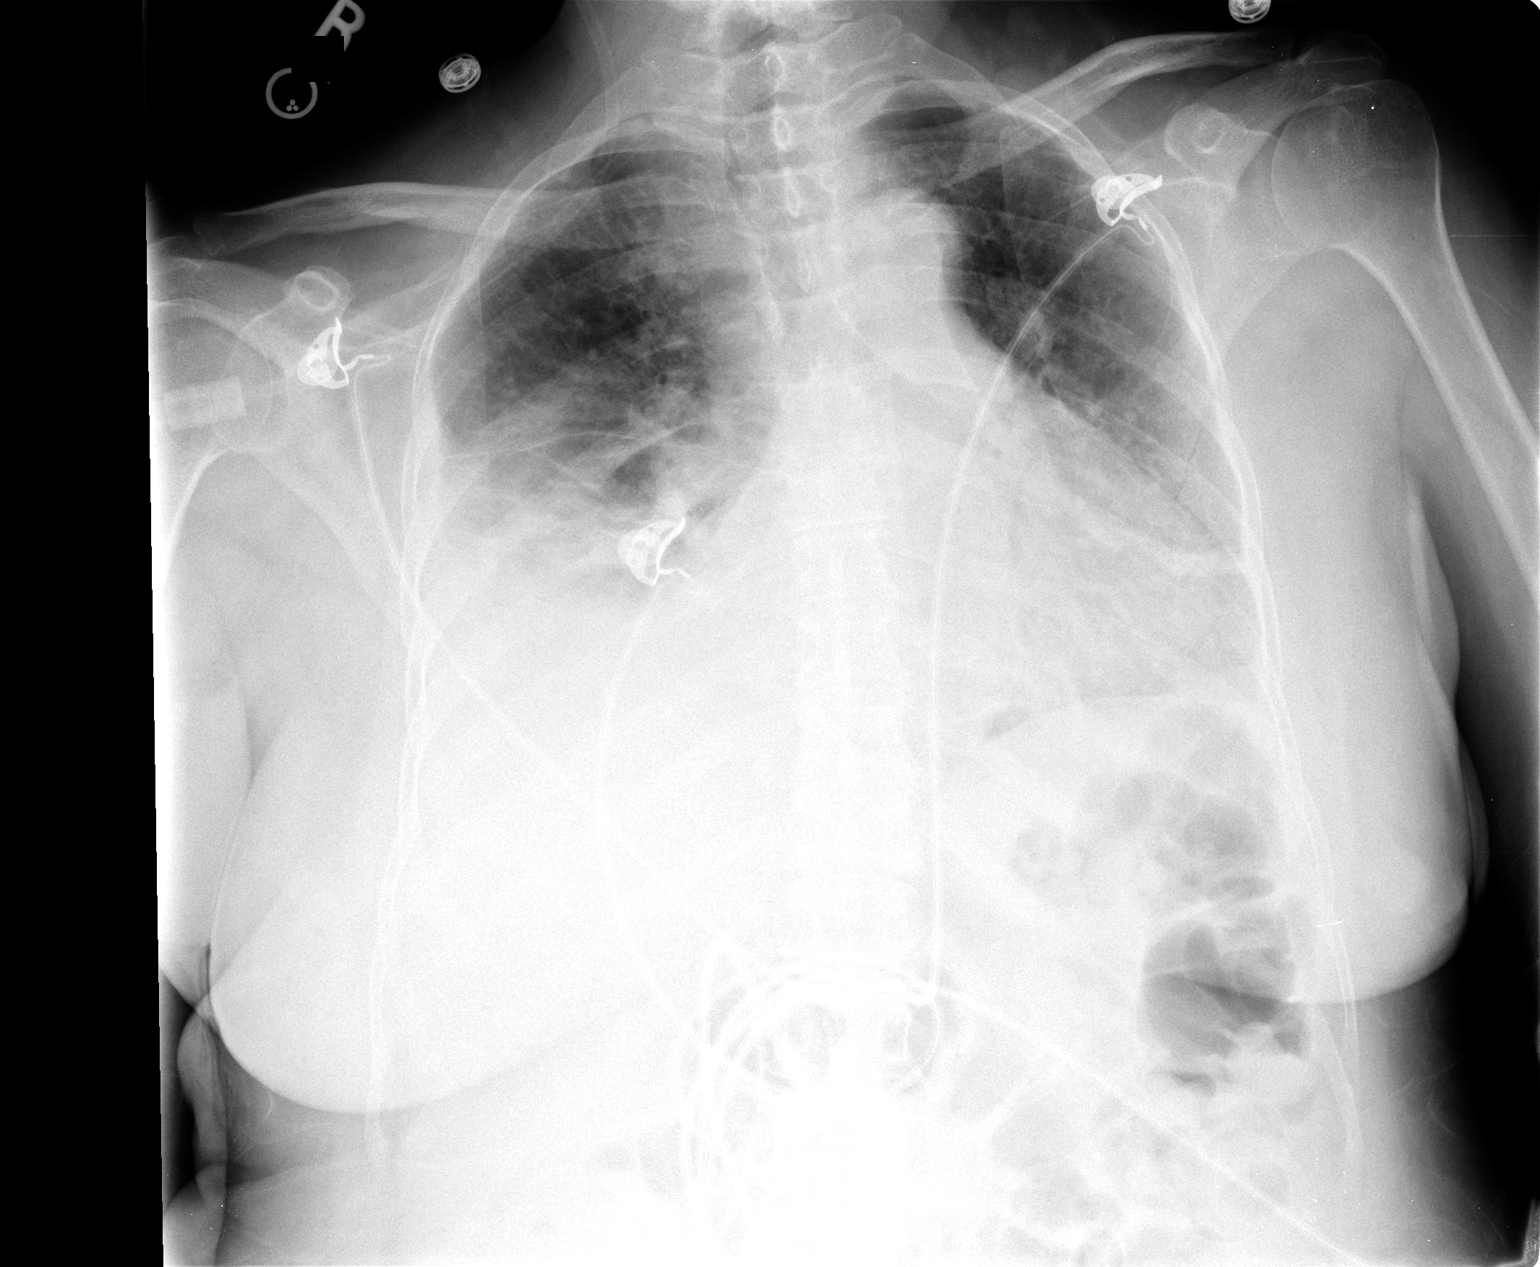

[2 of 2 positions shown; findings below may reference images not displayed]

FINDINGS: There is slight worsening in bibasilar atelectasis versus air space disease.  The right effusion is also slightly enlarged.  No pneumothorax.  The trachea is midline.   Stable cardiomegaly.
IMPRESSION: Slight worsening in bibasilar atelectasis versus air space disease and increase in right effusion.  Basilar pneumonia not excluded.

## 2007-12-17 ENCOUNTER — Encounter: Payer: Self-pay | Admitting: Family Medicine

## 2007-12-17 IMAGING — CR DG CHEST 2V
2 series · 2 of 2 positions shown · non-contrast
Comparison: [DATE].

CLINICAL DATA: Follow-up pneumonia. 
 CHEST - 2 VIEW:

[view not recorded (1 of 2)]
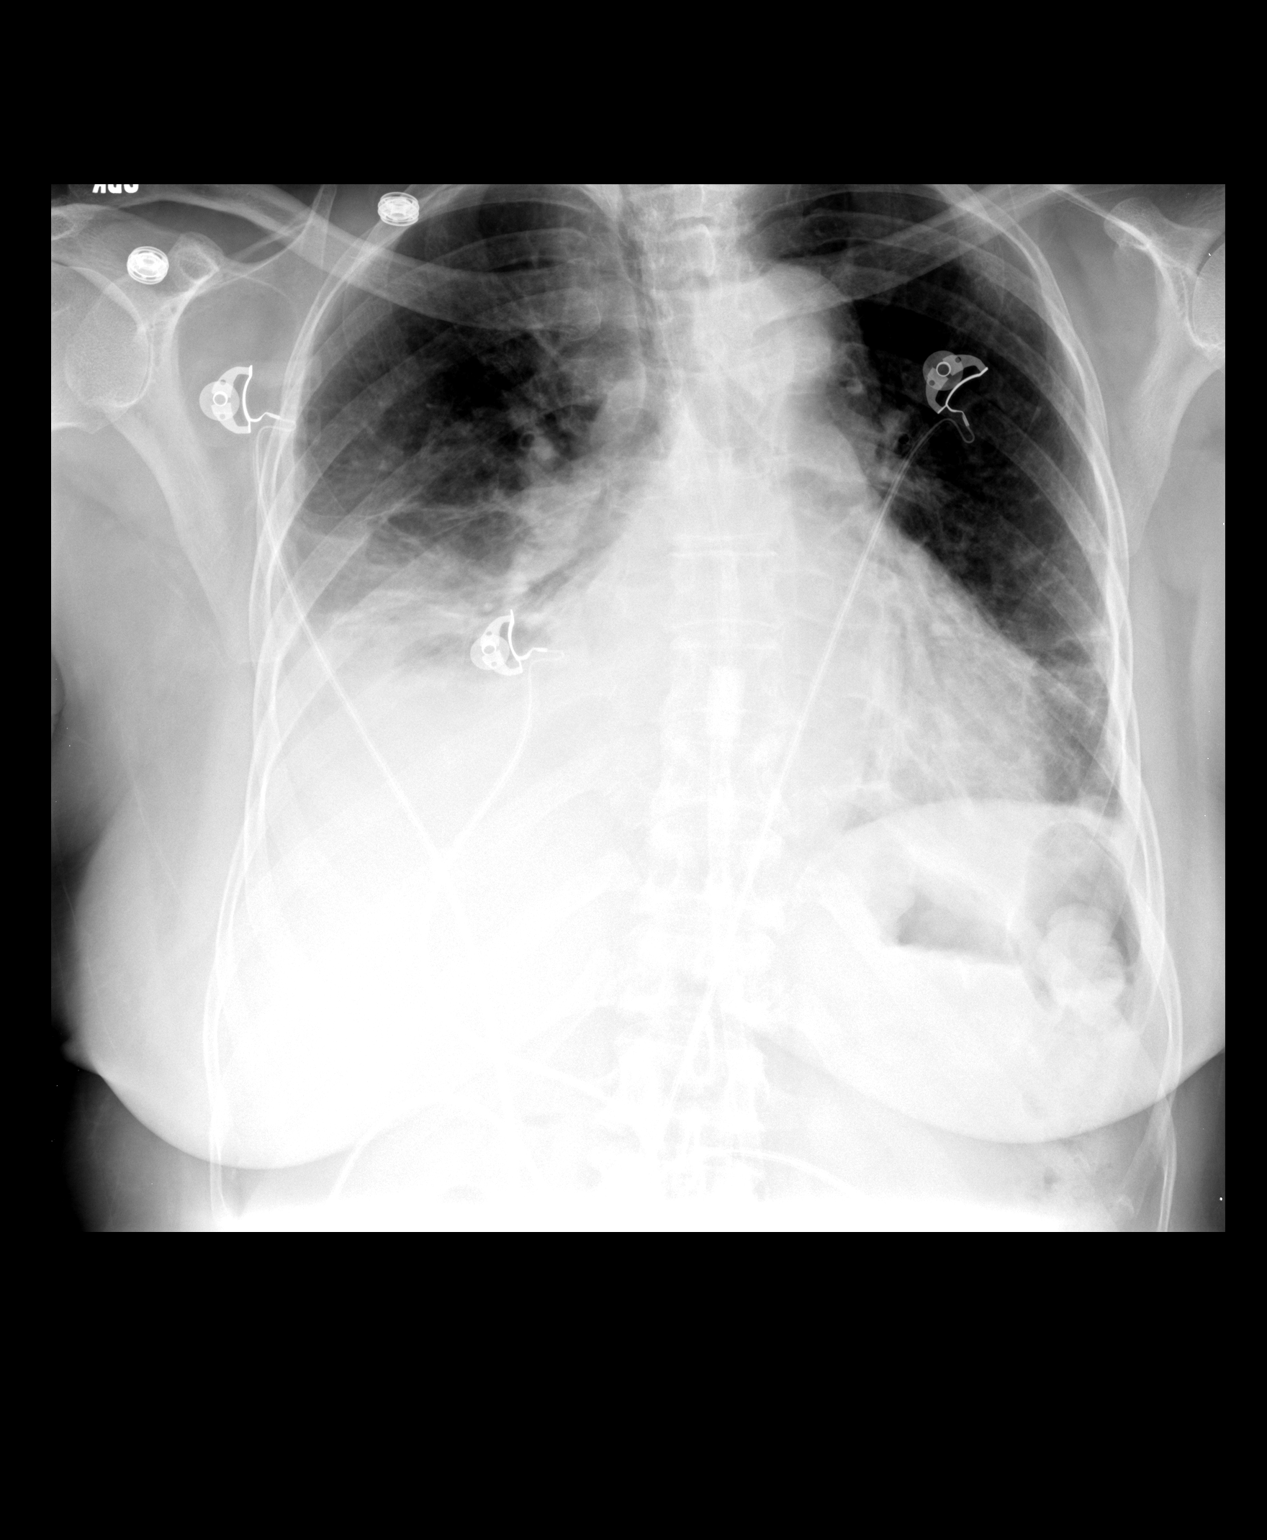

[view not recorded (2 of 2)]
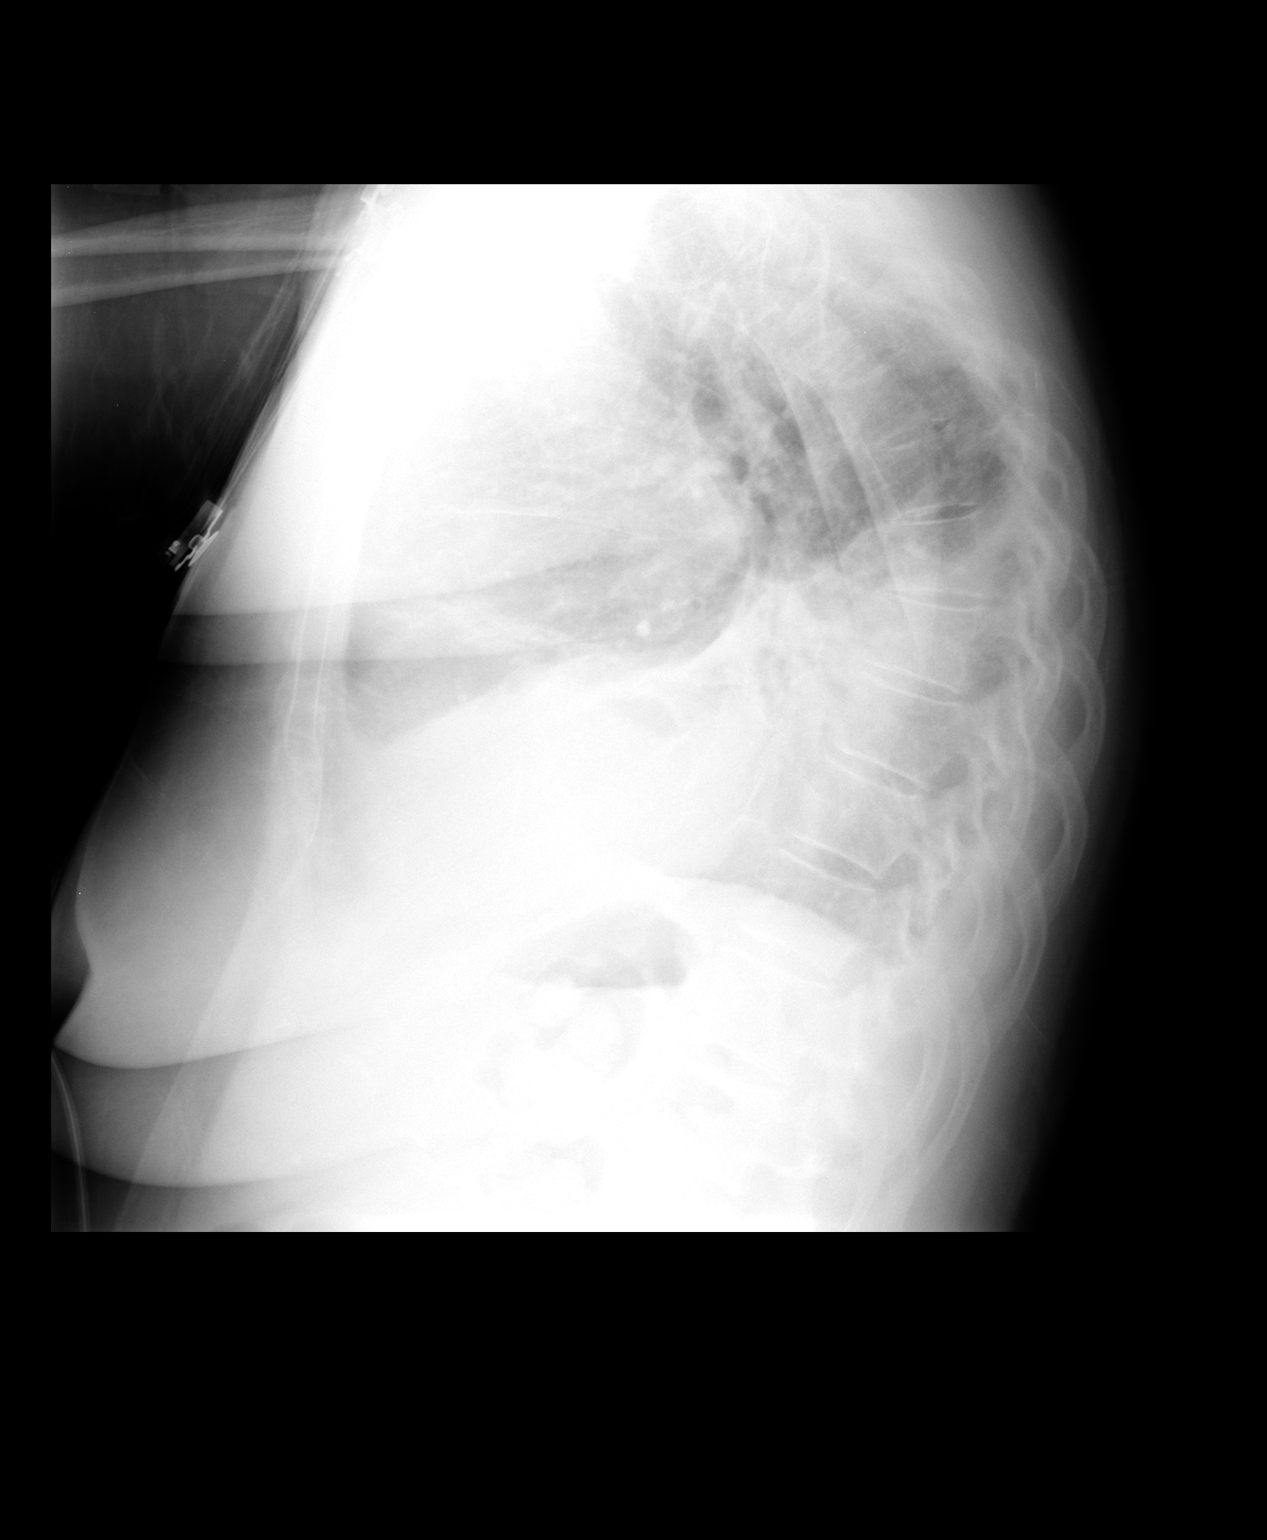

[2 of 2 positions shown; findings below may reference images not displayed]

FINDINGS: There is improved aeration in the right lung base.   There remains right lower lobe atelectasis or infiltrate and a right effusion.  There is mild left lower lobe airspace disease which is unchanged. There is cardiac enlargement and vascular congestion which appears slightly improved.
IMPRESSION: 1.  Improved aeration in the right lung base.  There is persistent right lower lobe consolidation and right effusion.  Mild left lower lobe airspace disease is unchanged.  
 2.  Cardiac enlargement and vascular congestion have improved in the interval.

## 2007-12-19 ENCOUNTER — Encounter: Payer: Self-pay | Admitting: Family Medicine

## 2007-12-19 ENCOUNTER — Encounter: Payer: Self-pay | Admitting: Internal Medicine

## 2007-12-19 ENCOUNTER — Encounter (INDEPENDENT_AMBULATORY_CARE_PROVIDER_SITE_OTHER): Payer: Self-pay | Admitting: Internal Medicine

## 2007-12-19 IMAGING — CR DG CHEST 2V
2 series · 2 of 2 positions shown · non-contrast
Comparison: [DATE].

CLINICAL DATA: Pneumonia, cough.  
CHEST - 2 VIEW:

[w chest pa]
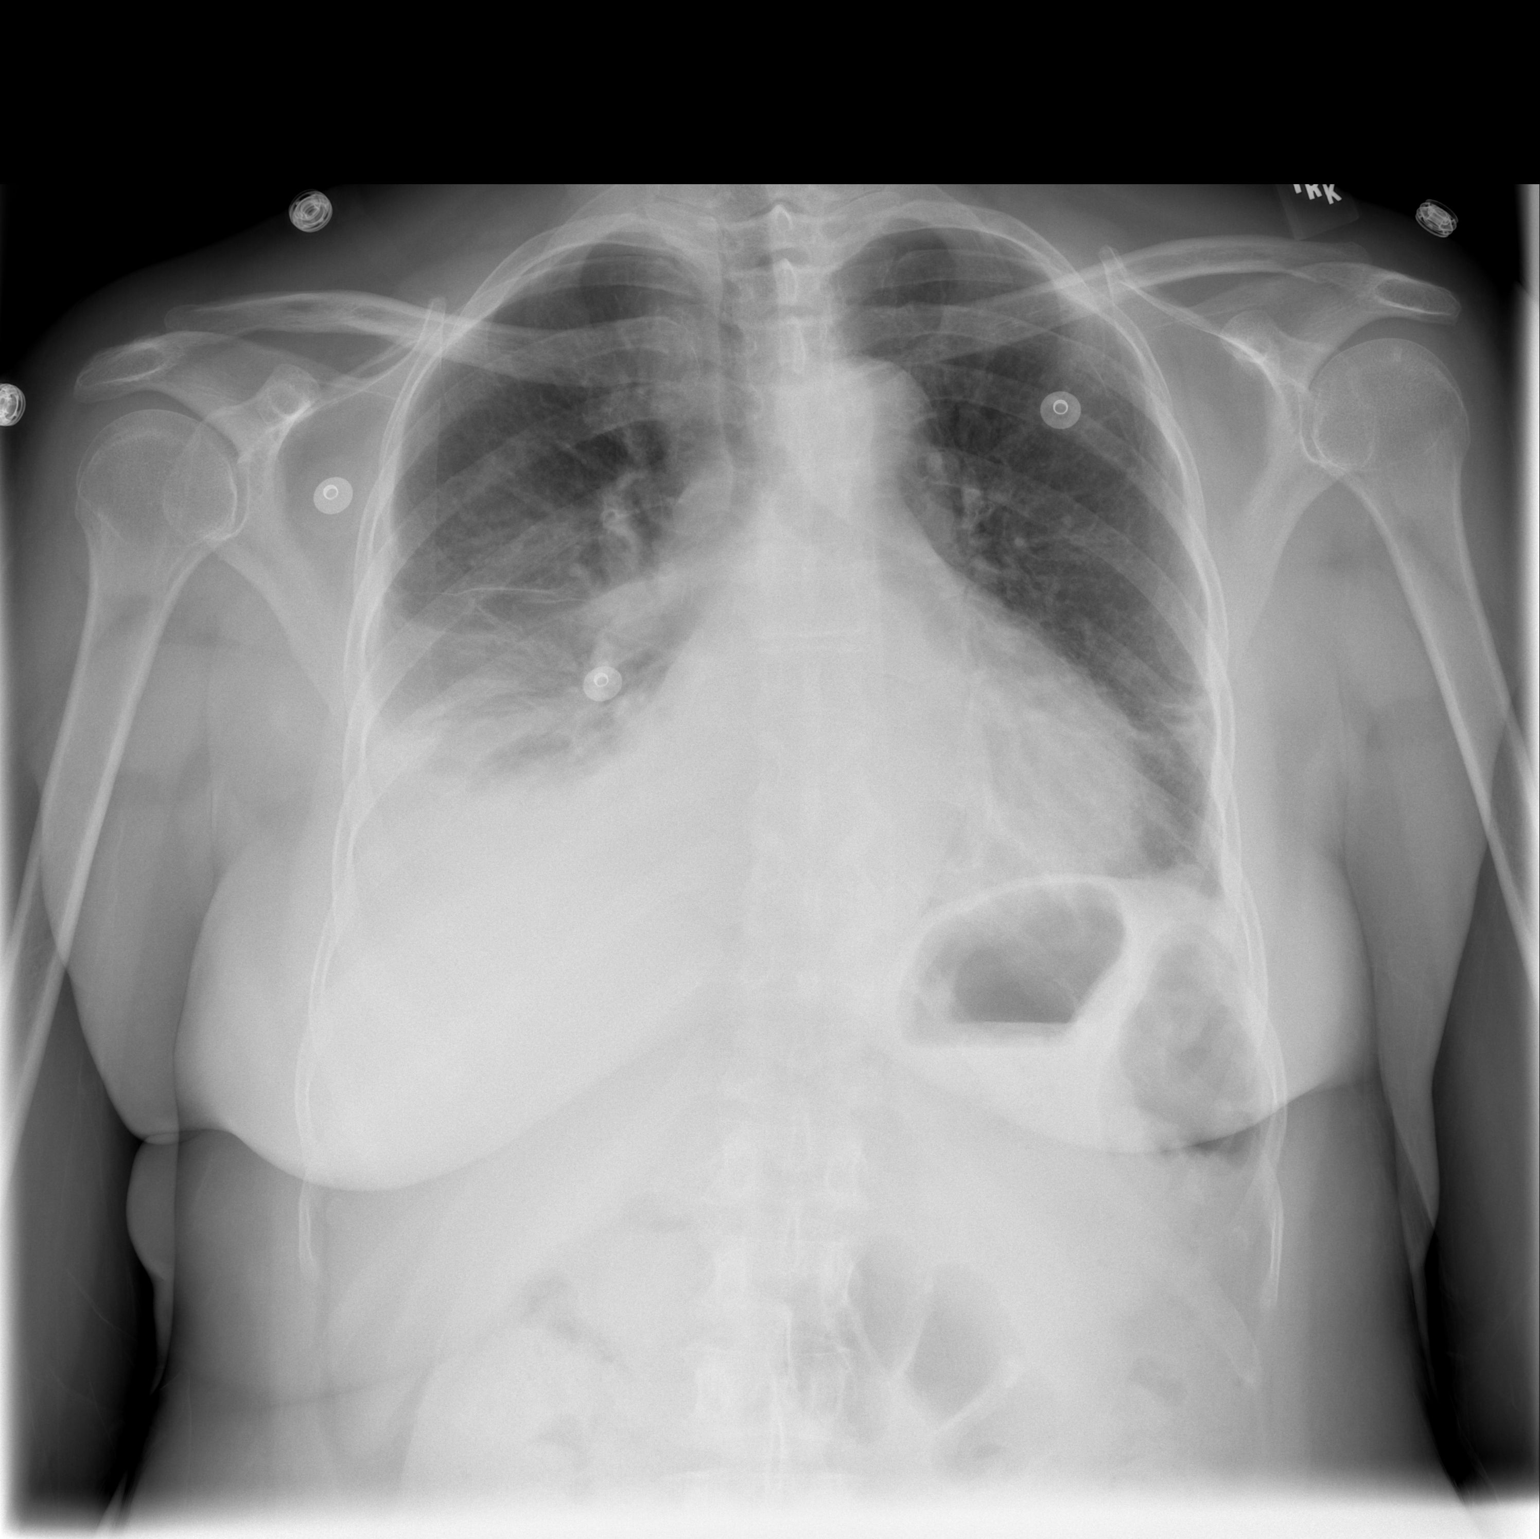

[w chest lat]
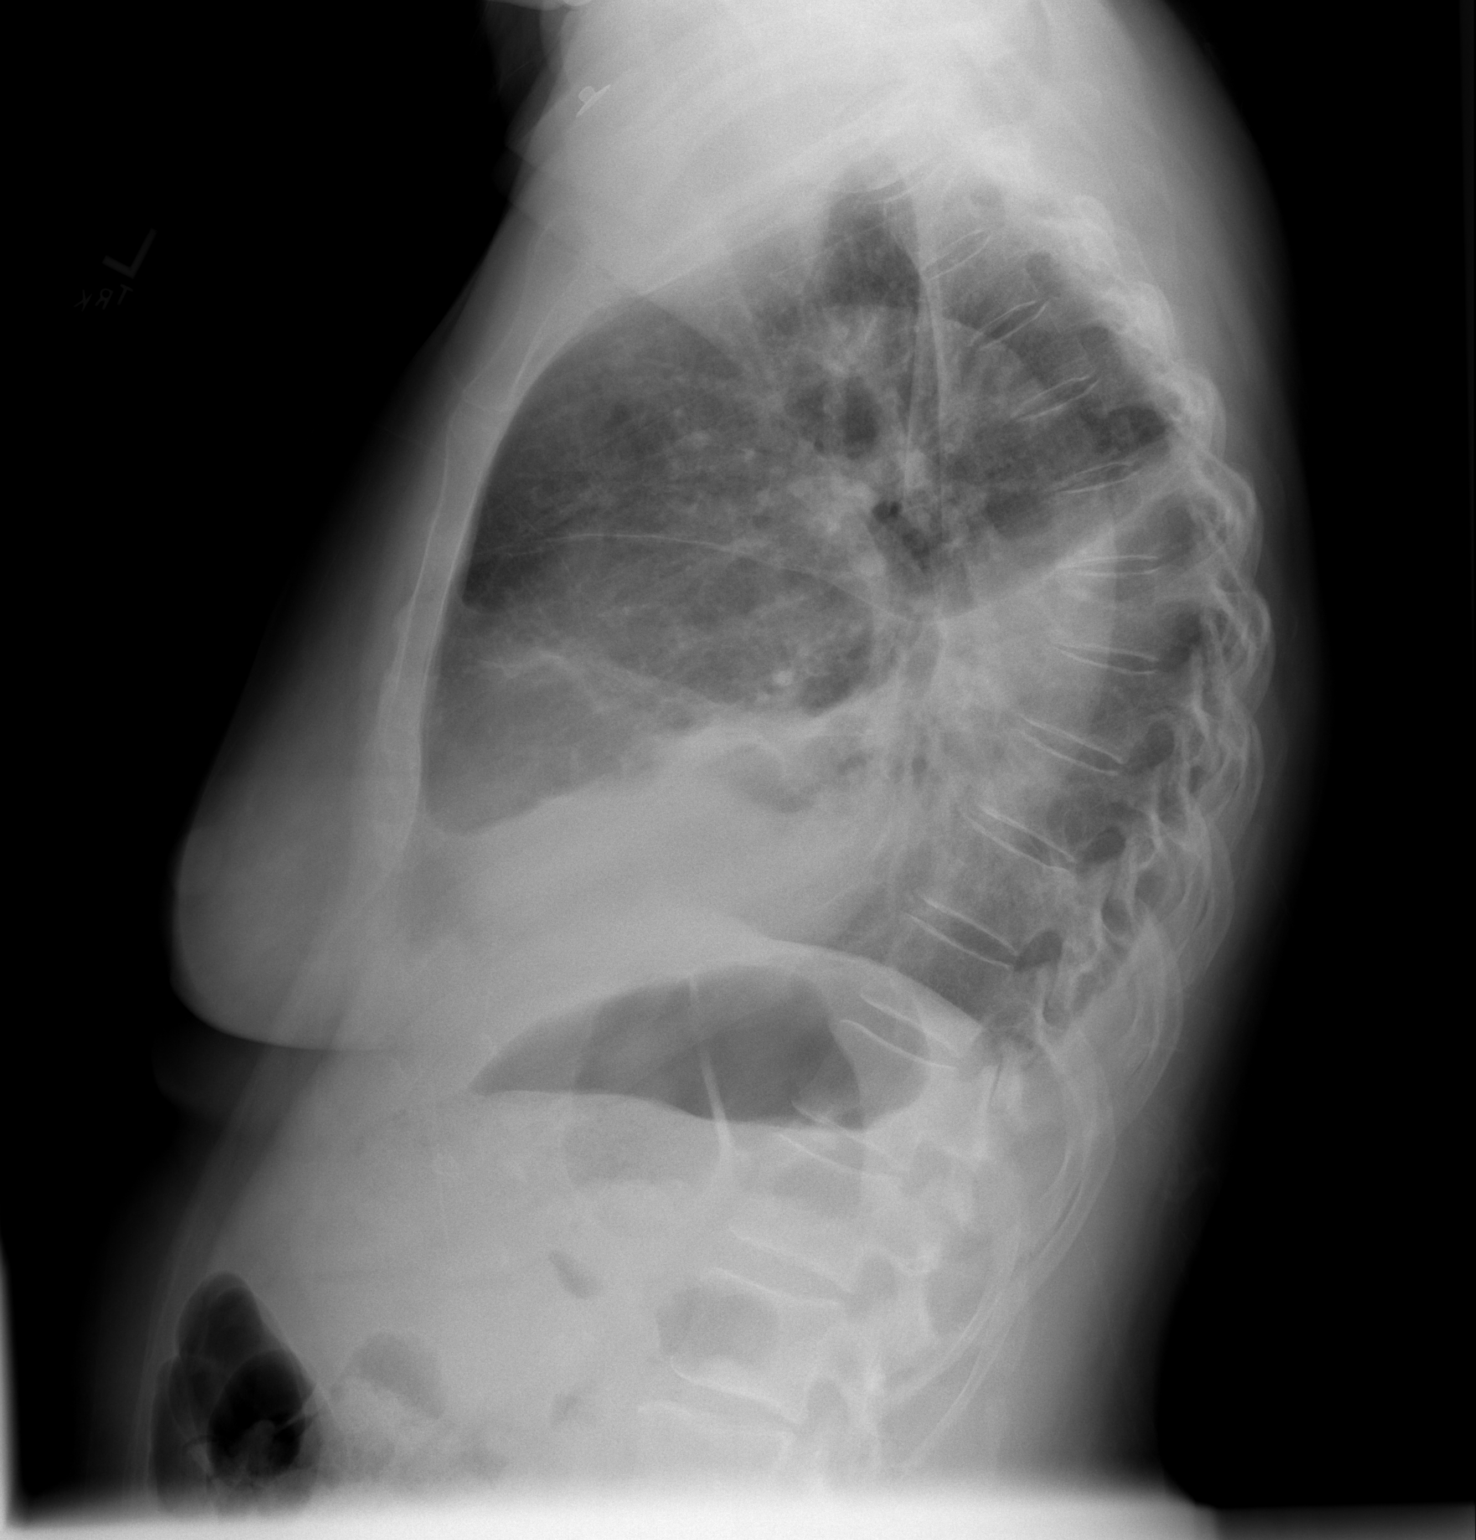

[2 of 2 positions shown; findings below may reference images not displayed]

FINDINGS: Trachea is midline.  Heart size stable.  Right pleural effusion and right middle and lower lobe airspace disease persist.  Minimal lingular and left lower lobe airspace disease.  Tiny left pleural effusion.
IMPRESSION: Bilateral pleural effusions and bibasilar airspace disease, right greater than left.

## 2007-12-20 ENCOUNTER — Encounter (INDEPENDENT_AMBULATORY_CARE_PROVIDER_SITE_OTHER): Payer: Self-pay | Admitting: Internal Medicine

## 2007-12-20 ENCOUNTER — Encounter: Payer: Self-pay | Admitting: Family Medicine

## 2007-12-20 ENCOUNTER — Encounter (INDEPENDENT_AMBULATORY_CARE_PROVIDER_SITE_OTHER): Payer: Self-pay | Admitting: Interventional Radiology

## 2007-12-20 IMAGING — CR DG CHEST 1V
1 series · 1 of 1 positions shown · non-contrast
Comparison: [DATE].

CLINICAL DATA: Pneumonia status post right thoracentesis. 
 CHEST ? 1 VIEW:

[w chest pa]
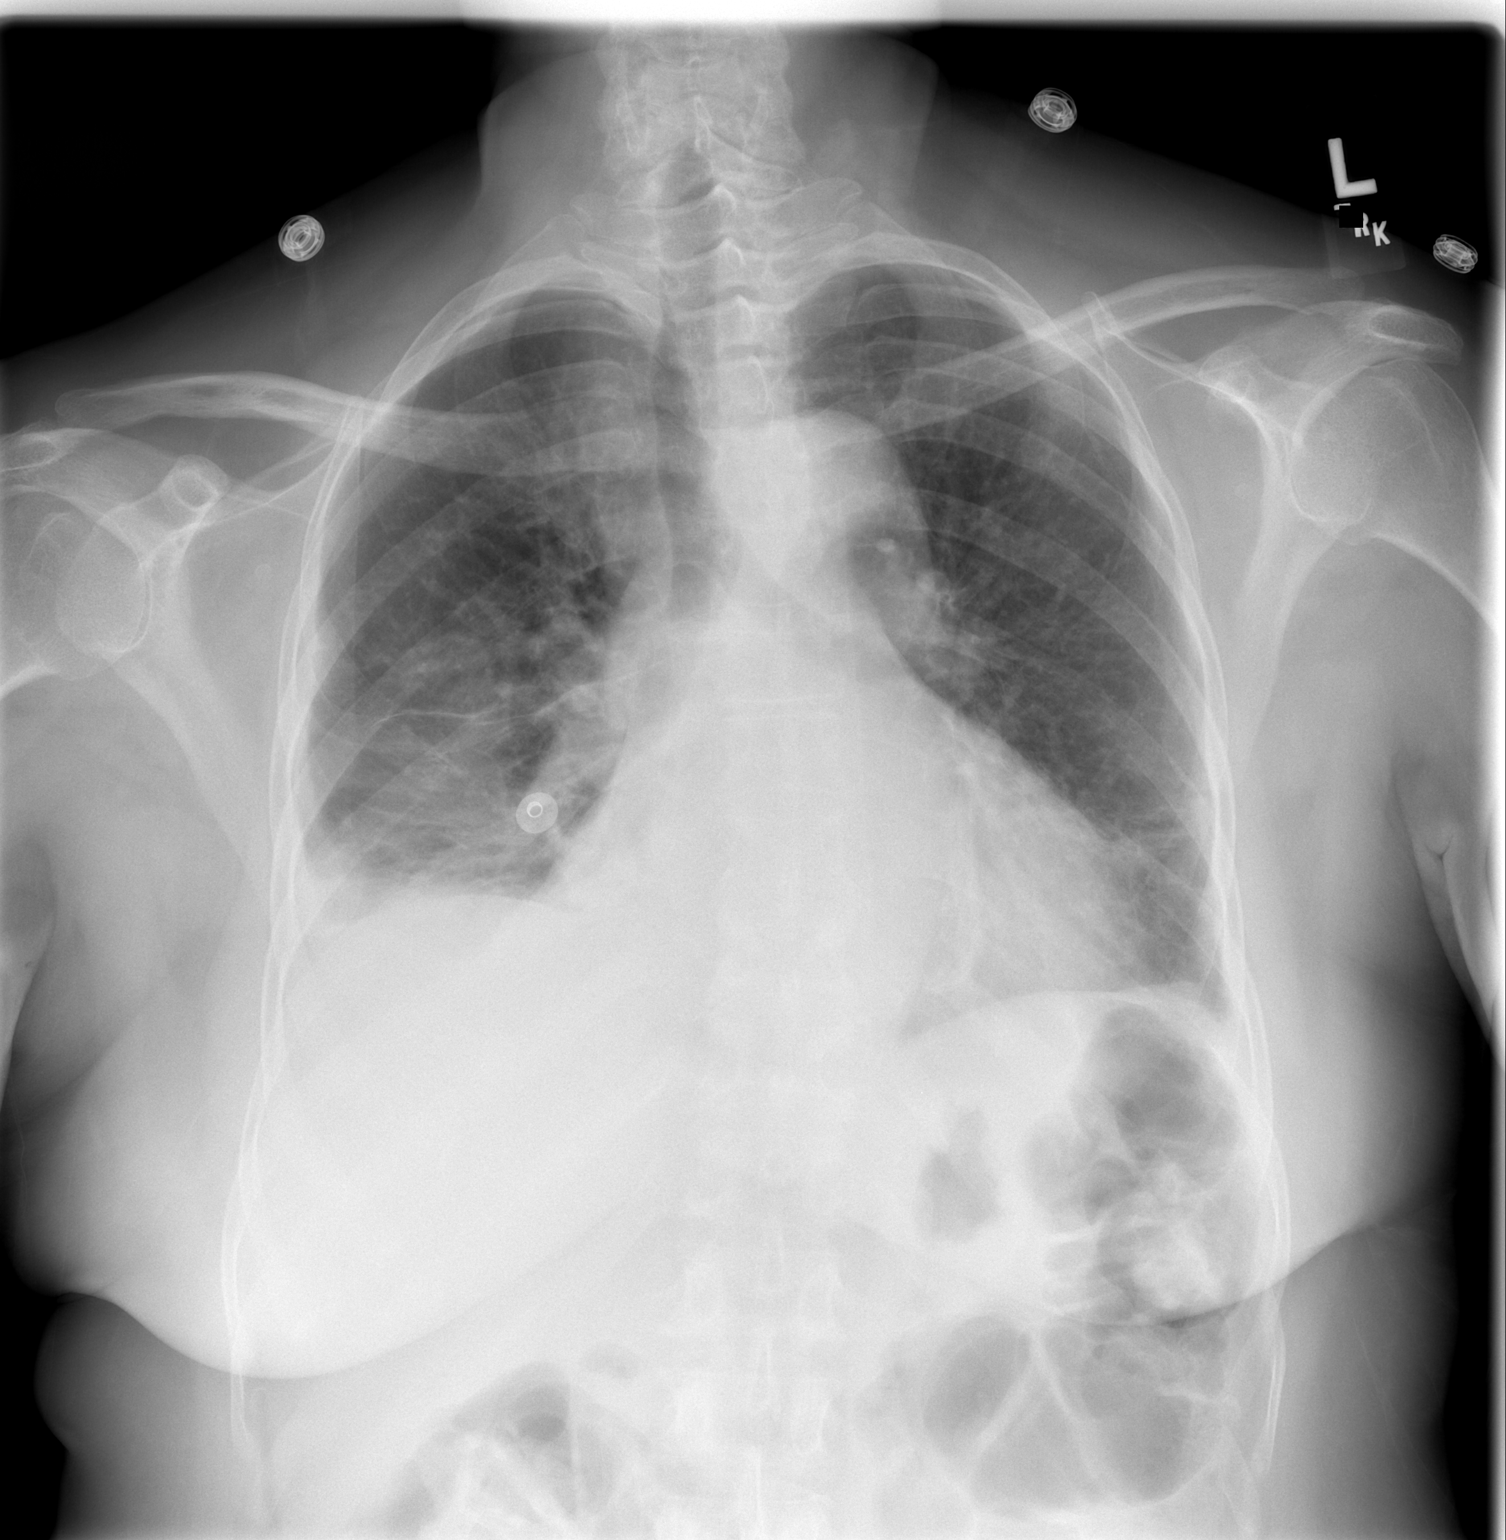

[1 of 1 positions shown; findings below may reference images not displayed]

FINDINGS: Some decrease in right pleural effusion post right thoracentesis.  No pneumothorax.  Right base atelectasis.
IMPRESSION: Specifically no right pneumothorax post thoracentesis.

## 2007-12-20 IMAGING — US US PARACENTESIS
1 series · 10 of 10 positions shown · non-contrast
Comparison: none

CLINICAL DATA: Pneumonia and right pleural effusion.  Request to performed diagnostic and therapeutic right thoracentesis.
 ULTRASOUND-GUIDED DIAGNOSTIC AND THERAPEUTIC THORACENTESIS:

[Series 1: unknown · 0.30mm/px · 10 of 10 slices shown]
[im 1/10]
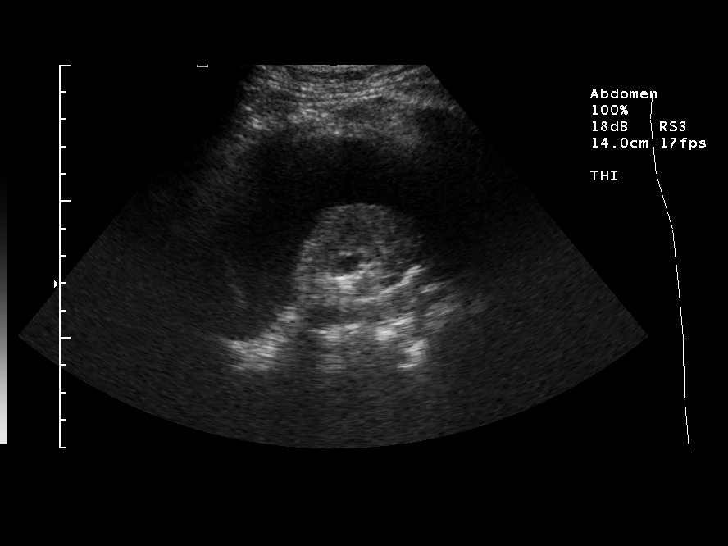
[im 2/10]
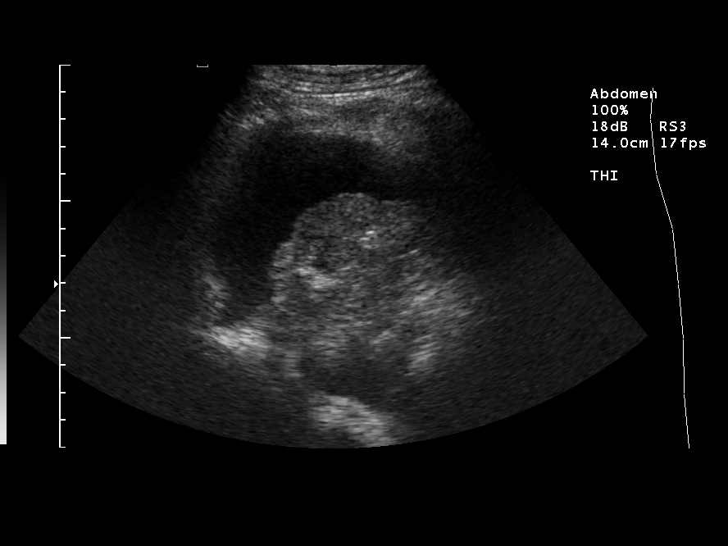
[im 3/10]
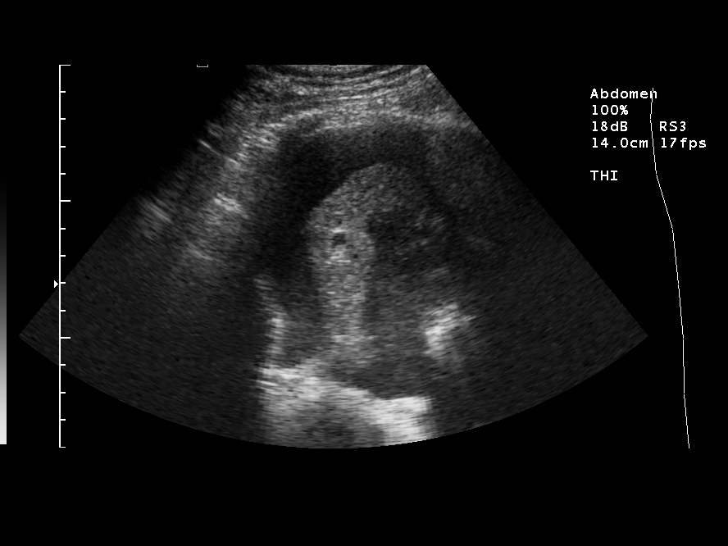
[im 4/10]
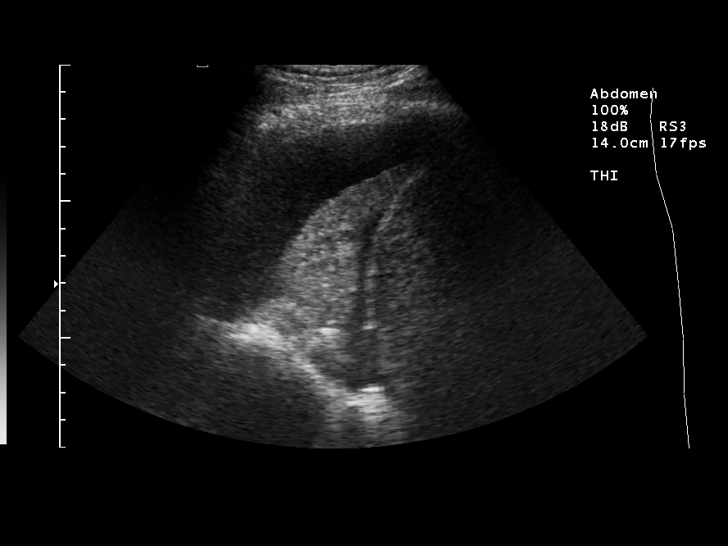
[im 5/10]
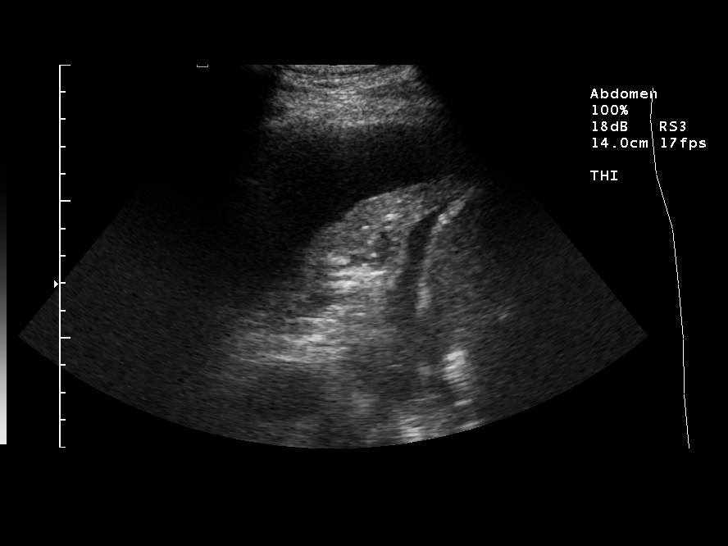
[im 6/10]
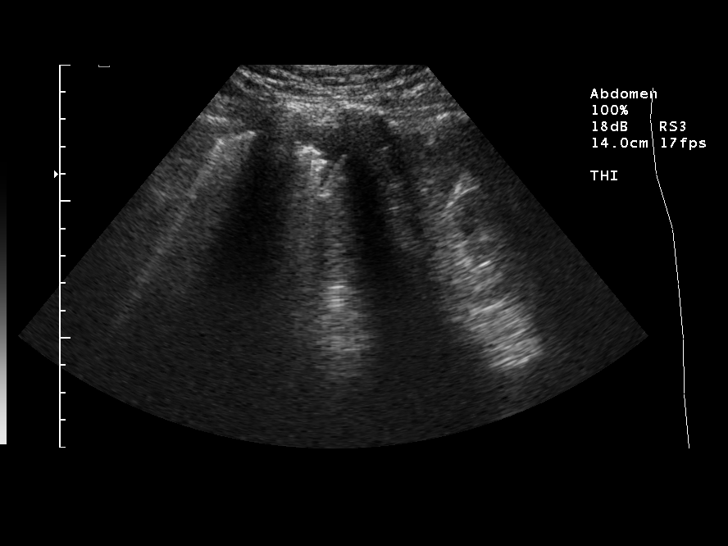
[im 7/10]
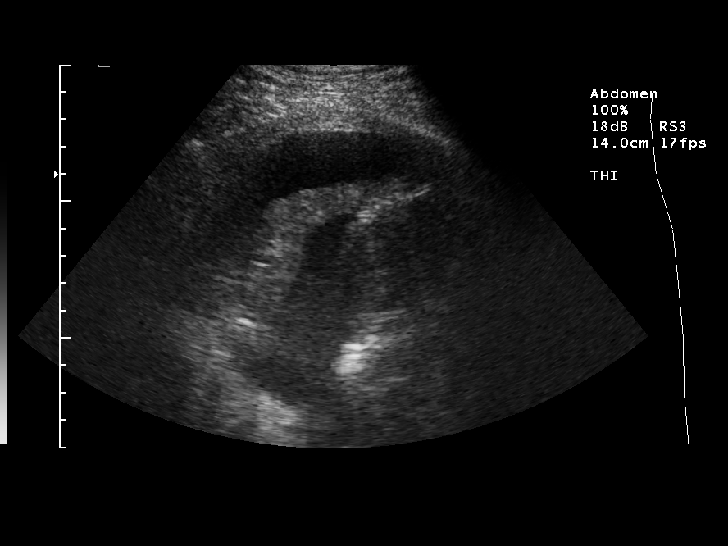
[im 8/10]
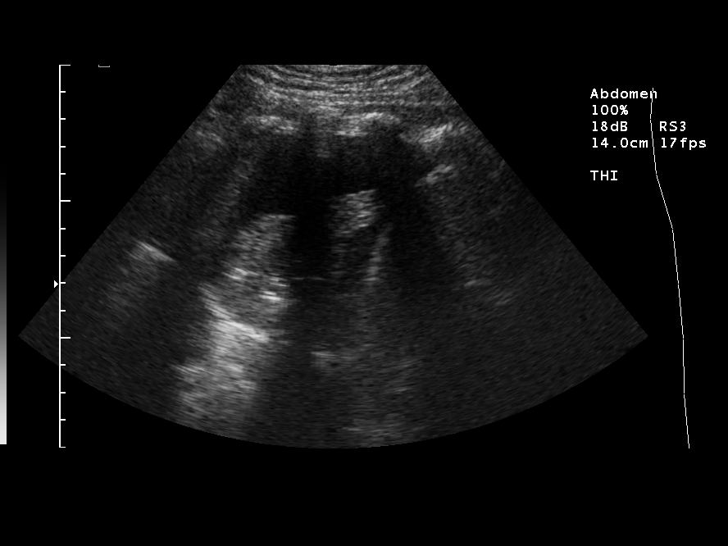
[im 9/10]
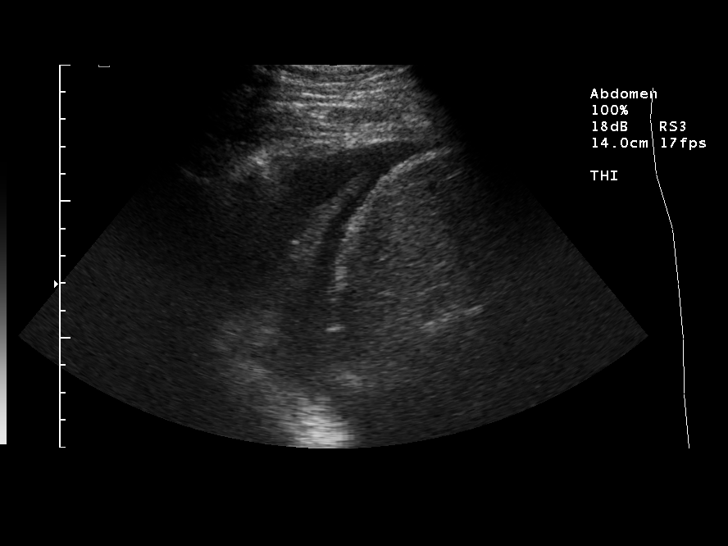
[im 10/10]
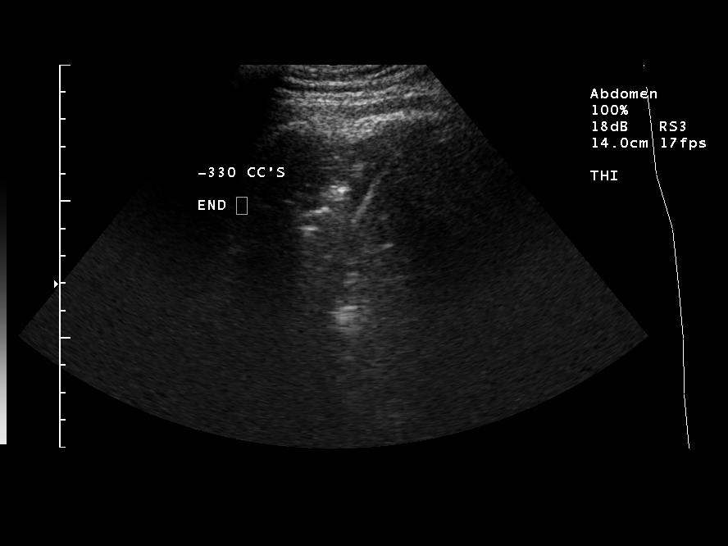

[10 of 10 positions shown; findings below may reference images not displayed]

FINDINGS: An ultrasound-guided thoracentesis was thoroughly discussed with the patient and questions answered.  The benefits, risks, alternatives, and complications were also discussed.  The patient understands and wishes to proceed with the procedure.  A verbal as well as written consent was obtained.
 Ultrasound was performed to localize and mark an adequate pocket of fluid for thoracentesis.  The right posterior chest   was prepped and draped in the normal sterile fashion.  1% Lidocaine was used for local anesthesia.  Under ultrasound guidance, a 19-gauge Yueh catheter was introduced yielding approximately 330 cc of serosanguineous type fluid.  An appropriate amount of fluid was sent to the laboratory for further analysis.  The patient tolerated the procedure well, and there were no immediate complications.
 Post procedure chest x-ray is pending.
 IMPRESSION 
 Successful ultrasound-guided diagnostic and therapeutic right thoracentesis yielding 330 cc of serosanguineous type fluid.

## 2007-12-21 ENCOUNTER — Encounter: Payer: Self-pay | Admitting: Family Medicine

## 2007-12-25 ENCOUNTER — Ambulatory Visit: Payer: Self-pay | Admitting: Cardiology

## 2007-12-25 ENCOUNTER — Inpatient Hospital Stay (HOSPITAL_COMMUNITY): Admission: EM | Admit: 2007-12-25 | Discharge: 2007-12-27 | Payer: Self-pay | Admitting: Emergency Medicine

## 2007-12-25 ENCOUNTER — Encounter (INDEPENDENT_AMBULATORY_CARE_PROVIDER_SITE_OTHER): Payer: Self-pay | Admitting: Internal Medicine

## 2007-12-25 IMAGING — CR DG CHEST 2V
2 series · 2 of 2 positions shown · non-contrast
Comparison: [DATE]

CLINICAL DATA: Irregular heart beat.
 CHEST ? 2 VIEW:

[w chest pa]
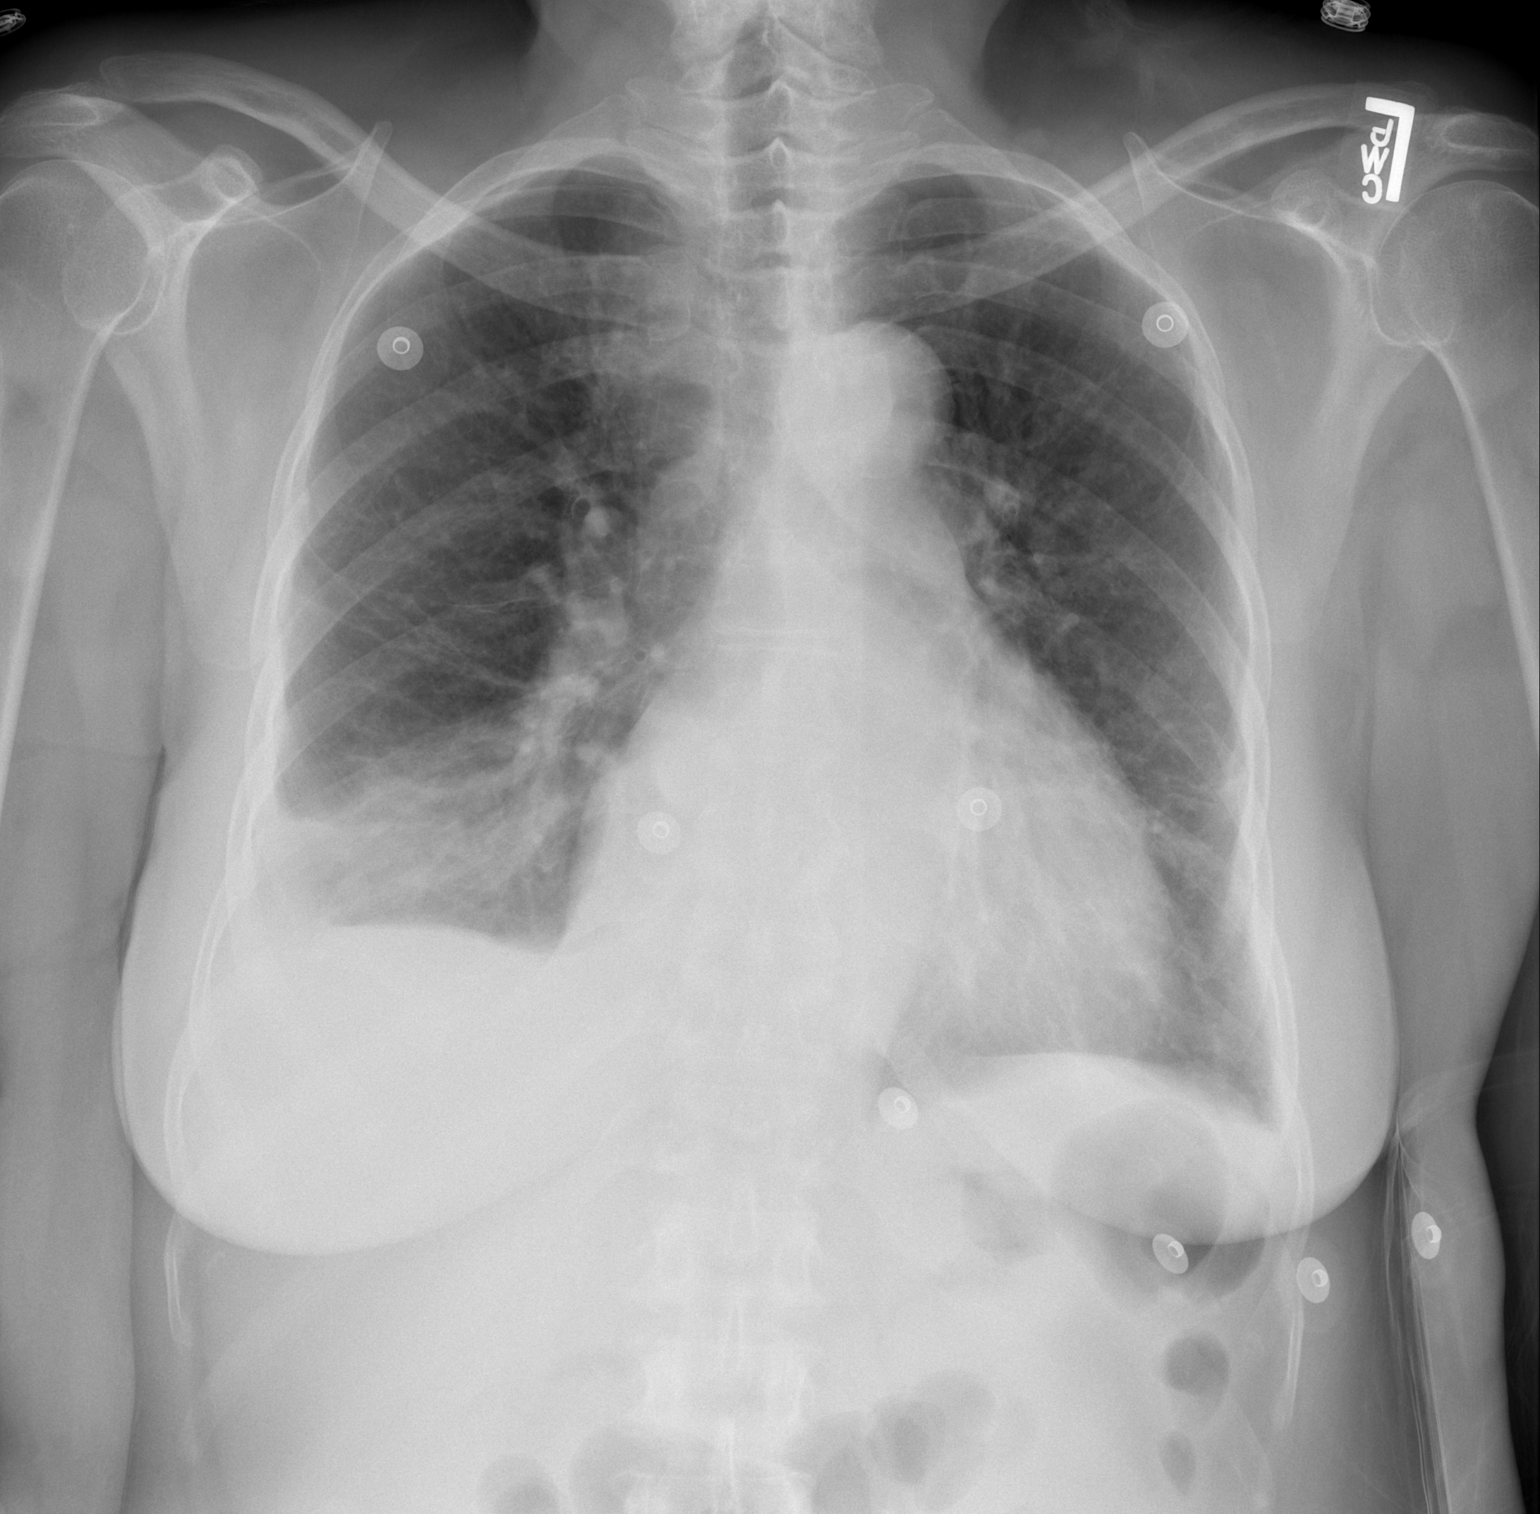

[w chest lat]
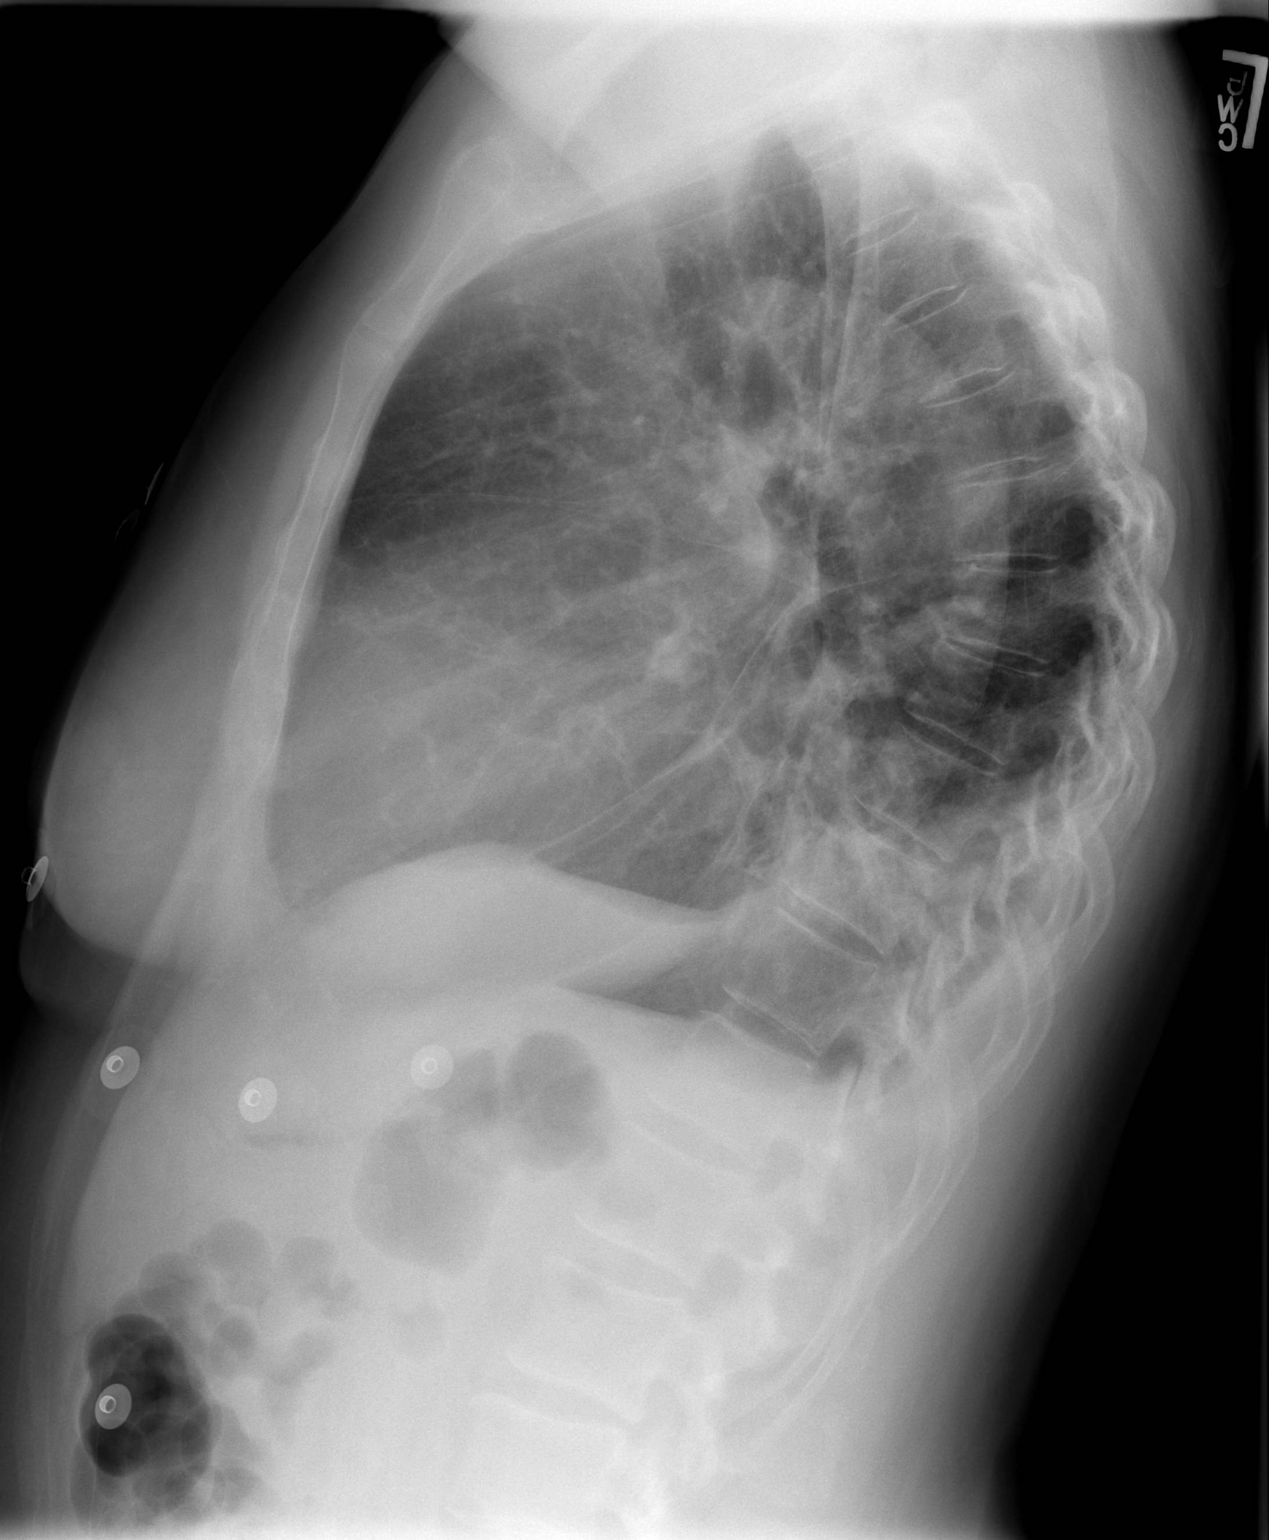

[2 of 2 positions shown; findings below may reference images not displayed]

FINDINGS: There is persistent borderline cardiomegaly.  There is no pulmonary edema.  There is small right pleural effusion with right basilar atelectasis or infiltrate.
IMPRESSION: Borderline cardiomegaly.  No pulmonary edema.  Small right pleural effusion with right basilar atelectasis or infiltrate.

## 2007-12-26 ENCOUNTER — Encounter: Payer: Self-pay | Admitting: Family Medicine

## 2007-12-27 ENCOUNTER — Encounter (INDEPENDENT_AMBULATORY_CARE_PROVIDER_SITE_OTHER): Payer: Self-pay | Admitting: Internal Medicine

## 2007-12-27 ENCOUNTER — Ambulatory Visit: Payer: Self-pay | Admitting: Internal Medicine

## 2007-12-28 ENCOUNTER — Ambulatory Visit: Payer: Self-pay | Admitting: Family Medicine

## 2008-01-01 ENCOUNTER — Encounter (INDEPENDENT_AMBULATORY_CARE_PROVIDER_SITE_OTHER): Payer: Self-pay | Admitting: Internal Medicine

## 2008-01-09 ENCOUNTER — Ambulatory Visit: Payer: Self-pay | Admitting: Internal Medicine

## 2008-01-09 LAB — CONVERTED CEMR LAB
Calcium: 9.7 mg/dL (ref 8.4–10.5)
Creatinine, Ser: 1.1 mg/dL (ref 0.4–1.2)
GFR calc Af Amer: 64 mL/min
GFR calc non Af Amer: 53 mL/min
Sodium: 142 meq/L (ref 135–145)

## 2008-01-31 ENCOUNTER — Ambulatory Visit: Payer: Self-pay | Admitting: Family Medicine

## 2008-02-07 ENCOUNTER — Ambulatory Visit: Payer: Self-pay | Admitting: Internal Medicine

## 2008-02-07 IMAGING — CR DG CHEST 2V
2 series · 2 of 2 positions shown · non-contrast
Comparison: [DATE]

CLINICAL DATA: Cough/follow-up pleural effusion

CHEST - 2 VIEW

[view not recorded (1 of 2)]
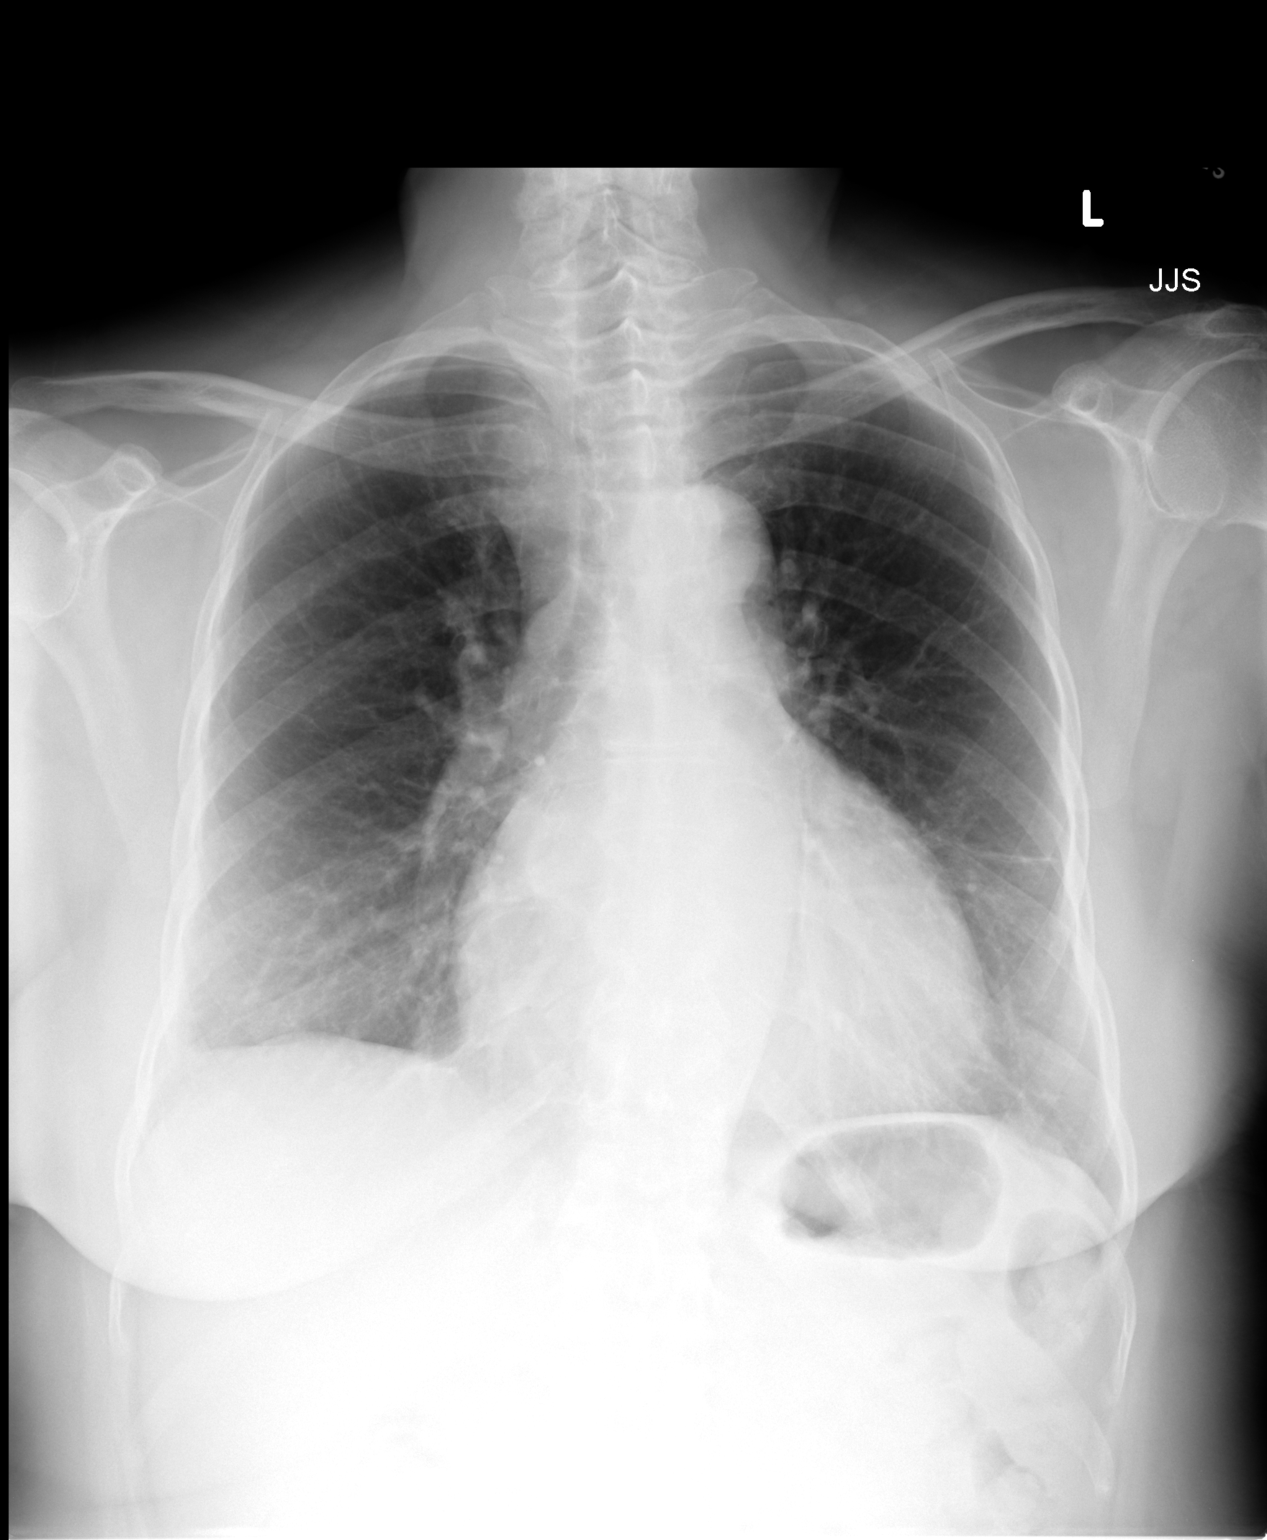

[view not recorded (2 of 2)]
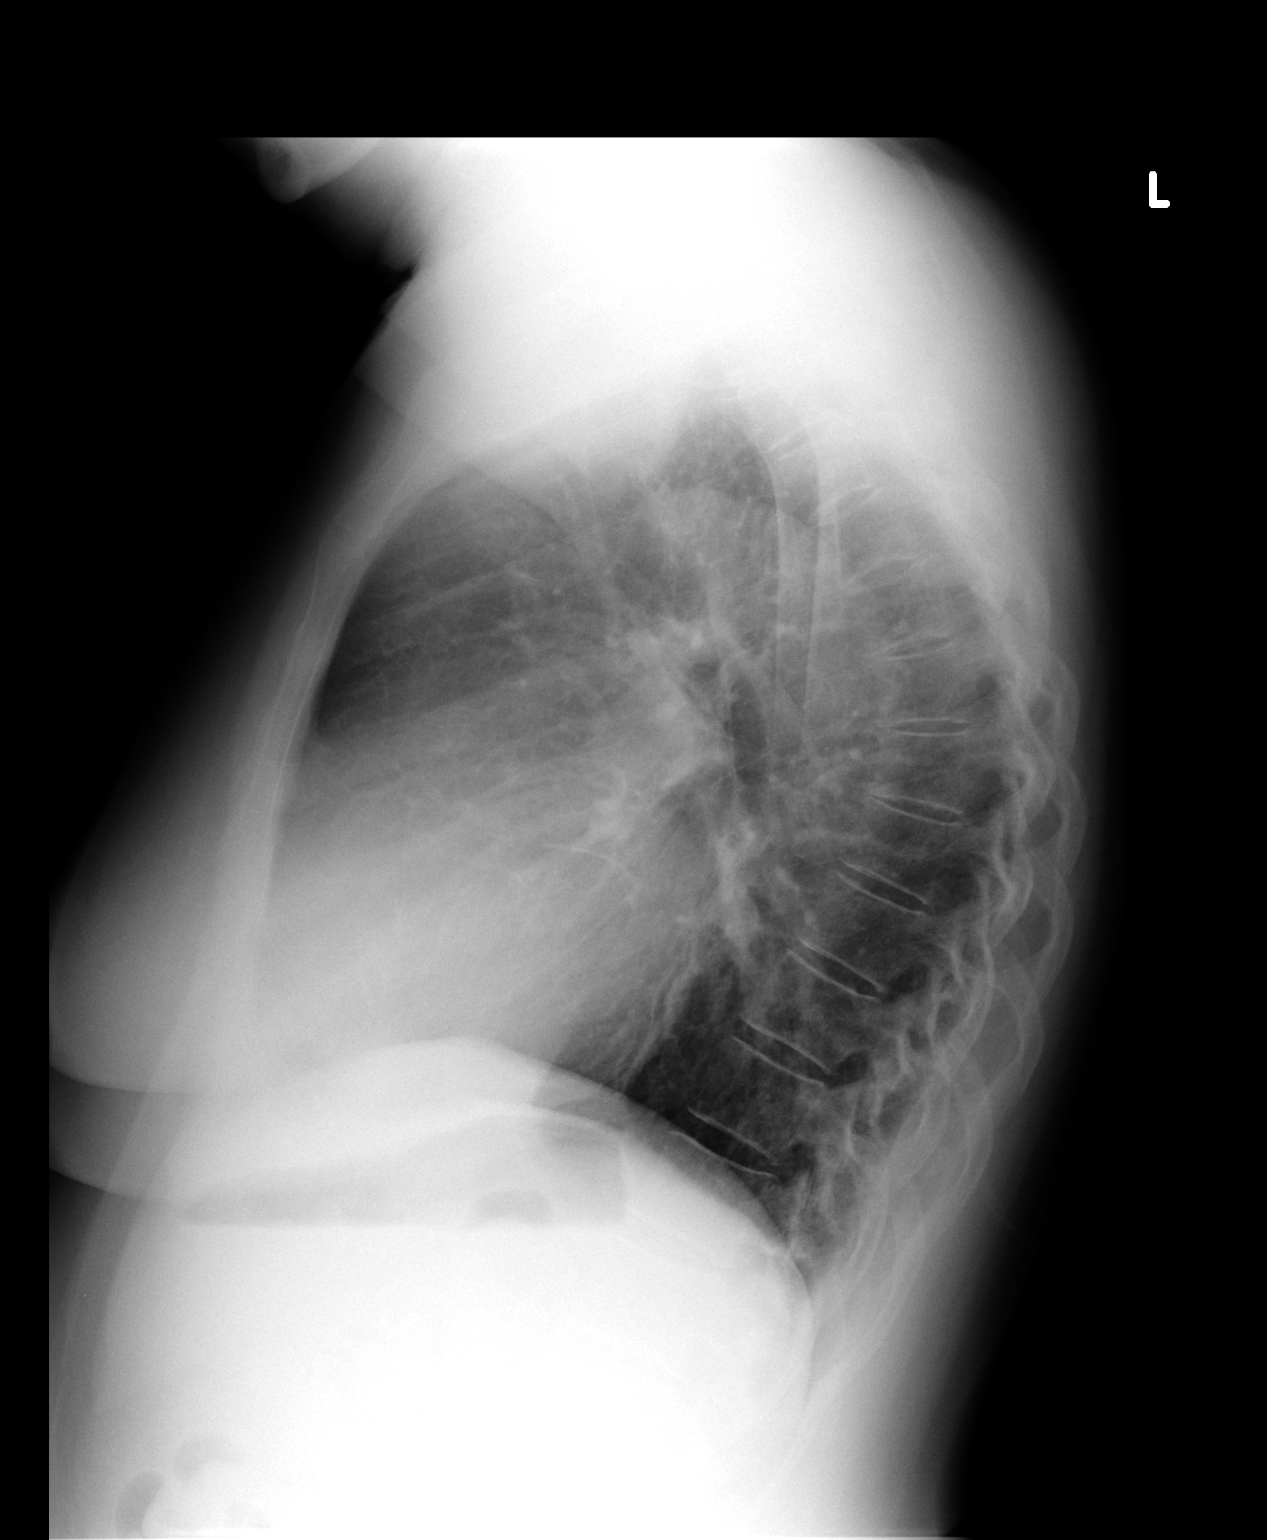

[2 of 2 positions shown; findings below may reference images not displayed]

FINDINGS: Heart mildly enlarged.  No congestive heart failure.  The
right pleural effusion and right lower lobe airspace process have
essentially resolved. There is some minimal residual pleural
reaction.  No new findings.
IMPRESSION: 1.  Right lower lobe airspace density and right effusion
essentially resolved.
2.  Mild cardiomegaly without failure.
3.  No new findings.

## 2008-02-15 ENCOUNTER — Encounter: Payer: Self-pay | Admitting: Gastroenterology

## 2008-02-15 ENCOUNTER — Ambulatory Visit: Payer: Self-pay | Admitting: Gastroenterology

## 2008-03-17 ENCOUNTER — Telehealth: Payer: Self-pay | Admitting: Gastroenterology

## 2008-03-18 ENCOUNTER — Encounter: Payer: Self-pay | Admitting: Gastroenterology

## 2008-03-25 ENCOUNTER — Ambulatory Visit: Payer: Self-pay | Admitting: Internal Medicine

## 2008-04-18 ENCOUNTER — Ambulatory Visit: Payer: Self-pay | Admitting: Gastroenterology

## 2008-04-18 HISTORY — PX: COLONOSCOPY: SHX174

## 2008-04-20 ENCOUNTER — Encounter (INDEPENDENT_AMBULATORY_CARE_PROVIDER_SITE_OTHER): Payer: Self-pay | Admitting: Internal Medicine

## 2008-08-22 ENCOUNTER — Ambulatory Visit: Payer: Self-pay | Admitting: Internal Medicine

## 2008-12-02 ENCOUNTER — Ambulatory Visit: Payer: Self-pay | Admitting: Internal Medicine

## 2008-12-24 ENCOUNTER — Ambulatory Visit: Payer: Self-pay | Admitting: Family Medicine

## 2008-12-30 ENCOUNTER — Ambulatory Visit: Payer: Self-pay | Admitting: Family Medicine

## 2008-12-31 LAB — CONVERTED CEMR LAB
Basophils Absolute: 0.1 10*3/uL (ref 0.0–0.1)
Calcium: 9.6 mg/dL (ref 8.4–10.5)
Cholesterol: 224 mg/dL — ABNORMAL HIGH (ref 0–200)
Eosinophils Relative: 3.5 % (ref 0.0–5.0)
GFR calc non Af Amer: 52.62 mL/min (ref >60)
Glucose, Bld: 88 mg/dL (ref 70–99)
HCT: 35.1 % — ABNORMAL LOW (ref 36.0–46.0)
HDL: 76.6 mg/dL (ref >39.00)
Hemoglobin: 11.9 g/dL — ABNORMAL LOW (ref 12.0–15.0)
Lymphocytes Relative: 29.6 % (ref 12.0–46.0)
Monocytes Relative: 8 % (ref 3.0–12.0)
Neutro Abs: 5 10*3/uL (ref 1.4–7.7)
Potassium: 3.8 meq/L (ref 3.5–5.1)
RBC: 3.82 M/uL — ABNORMAL LOW (ref 3.87–5.11)
RDW: 13.3 % (ref 11.5–14.6)
Sodium: 146 meq/L — ABNORMAL HIGH (ref 135–145)
Total CHOL/HDL Ratio: 3
VLDL: 14.8 mg/dL (ref 0.0–40.0)
WBC: 8.6 10*3/uL (ref 4.5–10.5)

## 2009-01-07 ENCOUNTER — Encounter: Payer: Self-pay | Admitting: Family Medicine

## 2009-01-07 ENCOUNTER — Ambulatory Visit: Payer: Self-pay | Admitting: Family Medicine

## 2009-01-12 ENCOUNTER — Encounter (INDEPENDENT_AMBULATORY_CARE_PROVIDER_SITE_OTHER): Payer: Self-pay | Admitting: *Deleted

## 2009-01-12 ENCOUNTER — Ambulatory Visit: Payer: Self-pay | Admitting: Family Medicine

## 2009-01-14 ENCOUNTER — Encounter (INDEPENDENT_AMBULATORY_CARE_PROVIDER_SITE_OTHER): Payer: Self-pay | Admitting: *Deleted

## 2009-05-18 IMAGING — CR DG CHEST 1V PORT
1 series · 1 of 1 positions shown · non-contrast
Comparison: [DATE].

CLINICAL DATA: Short of breath/chest pain.  
 PORTABLE CHEST - 1 VIEW [DATE]:

[view not recorded]
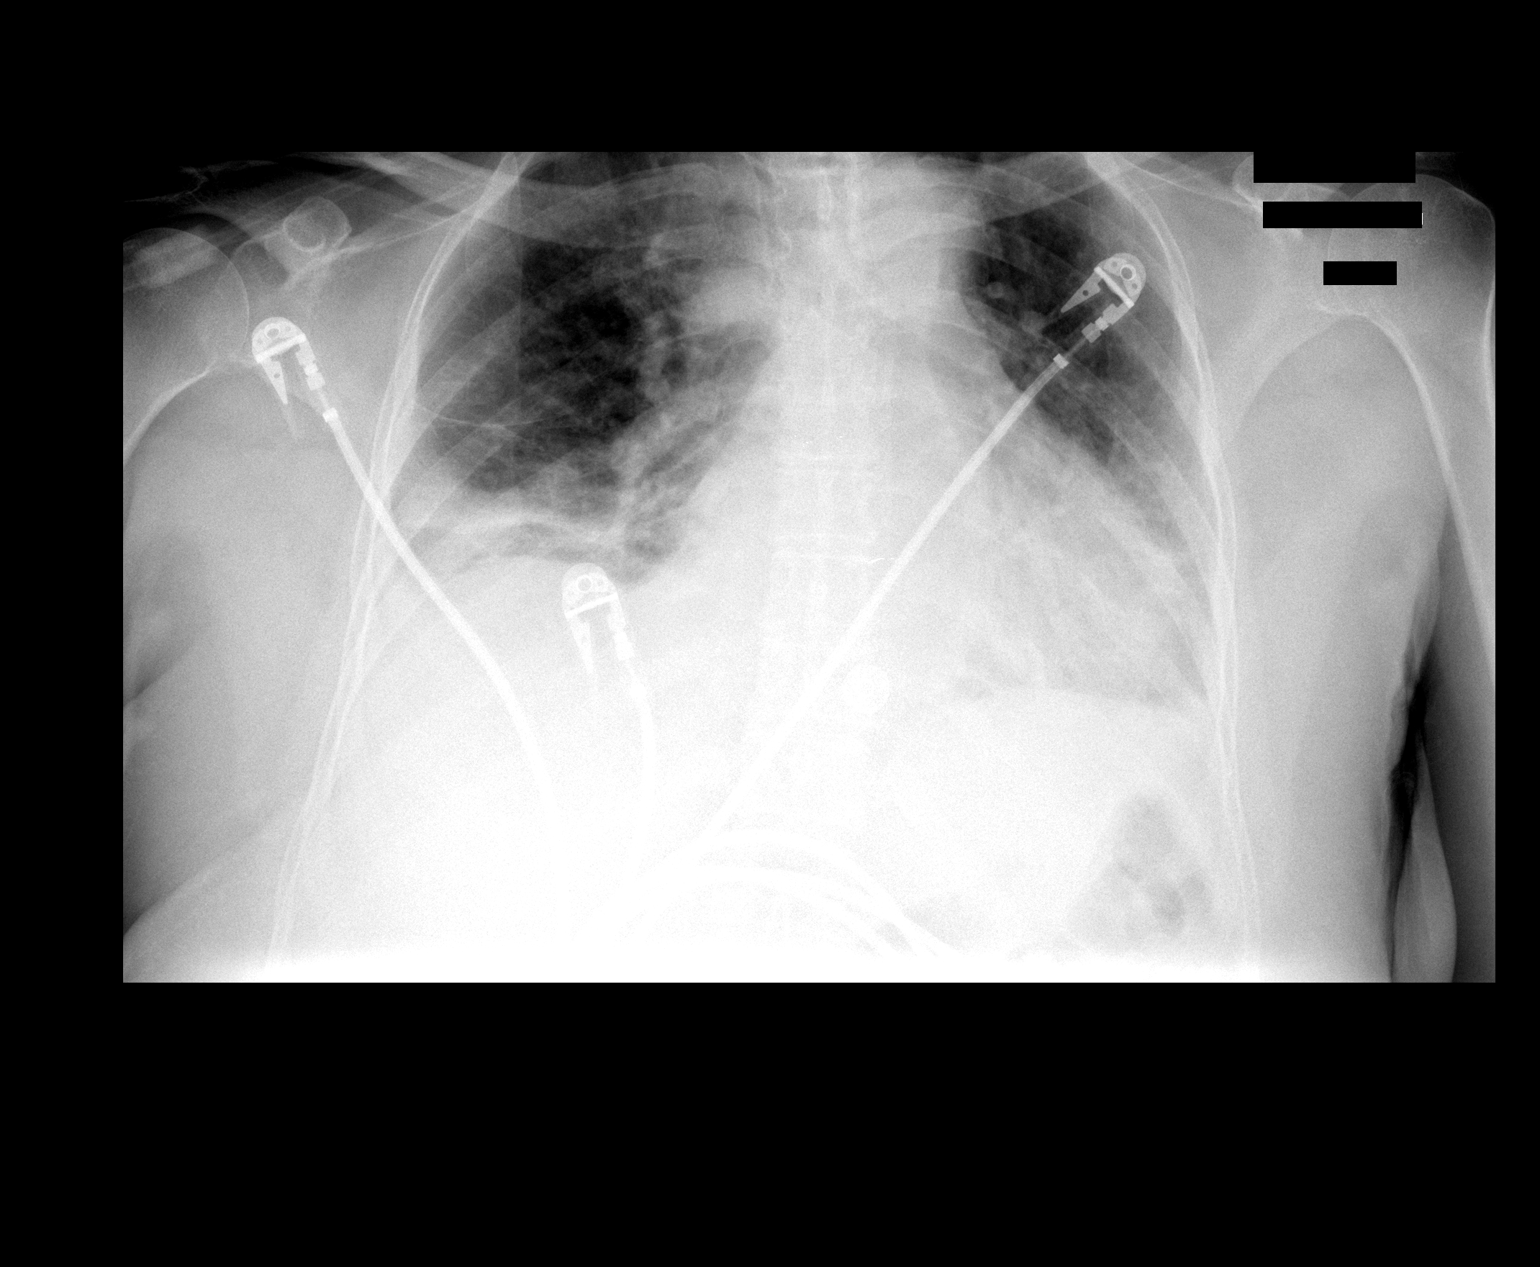

[1 of 1 positions shown; findings below may reference images not displayed]

FINDINGS: There is new right lower lobe subsegmental atelectasis or atelectatic pneumonia.  There is no definite heart failure.  
 Left lung clear.
IMPRESSION: Right lower lobe subsegmental atelectasis or atelectatic pneumonia ? new finding.

## 2009-08-05 ENCOUNTER — Ambulatory Visit: Payer: Self-pay | Admitting: Family Medicine

## 2009-08-14 ENCOUNTER — Ambulatory Visit: Payer: Self-pay | Admitting: Family Medicine

## 2009-08-17 LAB — CONVERTED CEMR LAB
Basophils Relative: 0.6 % (ref 0.0–3.0)
CO2: 29 meq/L (ref 19–32)
Chloride: 110 meq/L (ref 96–112)
Cholesterol: 215 mg/dL — ABNORMAL HIGH (ref 0–200)
Eosinophils Absolute: 0.2 10*3/uL (ref 0.0–0.7)
Glucose, Bld: 92 mg/dL (ref 70–99)
Hemoglobin: 11.9 g/dL — ABNORMAL LOW (ref 12.0–15.0)
Lymphs Abs: 2.3 10*3/uL (ref 0.7–4.0)
MCHC: 33.7 g/dL (ref 30.0–36.0)
MCV: 94.7 fL (ref 78.0–100.0)
Monocytes Absolute: 0.5 10*3/uL (ref 0.1–1.0)
Neutro Abs: 3 10*3/uL (ref 1.4–7.7)
RBC: 3.74 M/uL — ABNORMAL LOW (ref 3.87–5.11)
RDW: 12.8 % (ref 11.5–14.6)
Sodium: 147 meq/L — ABNORMAL HIGH (ref 135–145)
Total CHOL/HDL Ratio: 3

## 2009-08-19 ENCOUNTER — Ambulatory Visit: Payer: Self-pay | Admitting: Internal Medicine

## 2009-08-19 DIAGNOSIS — I495 Sick sinus syndrome: Secondary | ICD-10-CM | POA: Insufficient documentation

## 2009-08-19 DIAGNOSIS — E876 Hypokalemia: Secondary | ICD-10-CM | POA: Insufficient documentation

## 2009-09-09 ENCOUNTER — Telehealth: Payer: Self-pay | Admitting: Internal Medicine

## 2009-09-09 ENCOUNTER — Telehealth (INDEPENDENT_AMBULATORY_CARE_PROVIDER_SITE_OTHER): Payer: Self-pay | Admitting: Physician Assistant

## 2009-11-18 ENCOUNTER — Ambulatory Visit: Payer: Self-pay | Admitting: Family Medicine

## 2009-12-16 ENCOUNTER — Telehealth: Payer: Self-pay | Admitting: Family Medicine

## 2009-12-30 ENCOUNTER — Ambulatory Visit: Payer: Self-pay | Admitting: Family Medicine

## 2010-01-01 ENCOUNTER — Encounter (INDEPENDENT_AMBULATORY_CARE_PROVIDER_SITE_OTHER): Payer: Self-pay | Admitting: *Deleted

## 2010-02-05 ENCOUNTER — Ambulatory Visit: Payer: Self-pay | Admitting: Family Medicine

## 2010-02-05 ENCOUNTER — Encounter (INDEPENDENT_AMBULATORY_CARE_PROVIDER_SITE_OTHER): Payer: Self-pay | Admitting: *Deleted

## 2010-02-05 DIAGNOSIS — L989 Disorder of the skin and subcutaneous tissue, unspecified: Secondary | ICD-10-CM | POA: Insufficient documentation

## 2010-04-22 ENCOUNTER — Telehealth (INDEPENDENT_AMBULATORY_CARE_PROVIDER_SITE_OTHER): Payer: Self-pay | Admitting: *Deleted

## 2010-04-26 ENCOUNTER — Ambulatory Visit: Payer: Self-pay | Admitting: Family Medicine

## 2010-04-26 LAB — CONVERTED CEMR LAB
ALT: 17 units/L (ref 0–35)
AST: 21 units/L (ref 0–37)
Alkaline Phosphatase: 68 units/L (ref 39–117)
Basophils Absolute: 0.1 10*3/uL (ref 0.0–0.1)
Bilirubin, Direct: 0.1 mg/dL (ref 0.0–0.3)
CO2: 27 meq/L (ref 19–32)
Calcium: 9.7 mg/dL (ref 8.4–10.5)
Chloride: 113 meq/L — ABNORMAL HIGH (ref 96–112)
Eosinophils Absolute: 0.2 10*3/uL (ref 0.0–0.7)
Hemoglobin: 11.6 g/dL — ABNORMAL LOW (ref 12.0–15.0)
Lymphocytes Relative: 37.7 % (ref 12.0–46.0)
MCHC: 33.8 g/dL (ref 30.0–36.0)
Neutro Abs: 2.7 10*3/uL (ref 1.4–7.7)
Potassium: 4.6 meq/L (ref 3.5–5.1)
RDW: 13.3 % (ref 11.5–14.6)
Sodium: 144 meq/L (ref 135–145)
Total CHOL/HDL Ratio: 3
Total Protein: 7.1 g/dL (ref 6.0–8.3)

## 2010-05-11 ENCOUNTER — Ambulatory Visit: Payer: Self-pay | Admitting: Family Medicine

## 2010-05-11 DIAGNOSIS — R142 Eructation: Secondary | ICD-10-CM

## 2010-05-11 DIAGNOSIS — R141 Gas pain: Secondary | ICD-10-CM | POA: Insufficient documentation

## 2010-05-11 DIAGNOSIS — M8580 Other specified disorders of bone density and structure, unspecified site: Secondary | ICD-10-CM | POA: Insufficient documentation

## 2010-05-11 DIAGNOSIS — R143 Flatulence: Secondary | ICD-10-CM

## 2010-06-09 ENCOUNTER — Encounter: Payer: Self-pay | Admitting: Family Medicine

## 2010-06-09 ENCOUNTER — Ambulatory Visit: Payer: Self-pay | Admitting: Family Medicine

## 2010-06-15 ENCOUNTER — Encounter (INDEPENDENT_AMBULATORY_CARE_PROVIDER_SITE_OTHER): Payer: Self-pay | Admitting: *Deleted

## 2010-06-22 ENCOUNTER — Ambulatory Visit: Payer: Self-pay | Admitting: Family Medicine

## 2010-06-23 LAB — CONVERTED CEMR LAB: Vit D, 25-Hydroxy: 17 ng/mL — ABNORMAL LOW (ref 30–89)

## 2010-08-19 ENCOUNTER — Encounter: Payer: Self-pay | Admitting: Physician Assistant

## 2010-08-19 ENCOUNTER — Ambulatory Visit: Payer: Self-pay | Admitting: Internal Medicine

## 2010-08-19 DIAGNOSIS — I358 Other nonrheumatic aortic valve disorders: Secondary | ICD-10-CM

## 2010-08-19 DIAGNOSIS — I350 Nonrheumatic aortic (valve) stenosis: Secondary | ICD-10-CM

## 2010-08-19 HISTORY — DX: Other nonrheumatic aortic valve disorders: I35.8

## 2010-08-23 LAB — CONVERTED CEMR LAB
CO2: 25 meq/L (ref 19–32)
Calcium: 10 mg/dL (ref 8.4–10.5)
Creatinine, Ser: 1.3 mg/dL — ABNORMAL HIGH (ref 0.4–1.2)
Glucose, Bld: 84 mg/dL (ref 70–99)

## 2010-08-30 ENCOUNTER — Ambulatory Visit: Payer: Self-pay | Admitting: Cardiovascular Disease

## 2010-08-30 ENCOUNTER — Encounter: Payer: Self-pay | Admitting: Internal Medicine

## 2010-08-30 ENCOUNTER — Ambulatory Visit: Payer: Self-pay

## 2010-08-30 ENCOUNTER — Ambulatory Visit (HOSPITAL_COMMUNITY): Admission: RE | Admit: 2010-08-30 | Discharge: 2010-08-30 | Payer: Self-pay | Admitting: Internal Medicine

## 2010-11-11 NOTE — Letter (Signed)
Summary: Out of Work  [employer] [facility]  [address] [place], [place] [address]   Phone: [phone]  Fax: [phone]    February 05, 2010   Employee:  [name] [facility]    To Whom It May Concern:   For Medical reasons, please excuse the above named employee from work for the following dates:  Start:  February 05, 2010 3:47 PM   End:   Until Next Appointment appoximately in 1 week  If you need additional information, please feel free to contact our office.         Sincerely,  Eliezer Lofts MD  Appended Document: Out of Work This letter was wrote in error wrong patient.[name]

## 2010-11-11 NOTE — Consult Note (Signed)
Summary: [facility]/Dr.Rowe  [facility]/Dr.Rowe   Imported By: Lanice Shirts 11/05/2007 14:15:04  _____________________________________________________________________  External Attachment:    Type:   Image     Comment:   External Document

## 2010-11-11 NOTE — Progress Notes (Signed)
Summary: rash on feet and arms   Phone Note Call from Patient Call back at Home Phone [phone]   Caller: Patient Call For: Eliezer Lofts MD Summary of Call: Patient has a rash all over arm and on feet. She says that it started out about 3 days ago with just a blister on her foot, and she thought she was bitten by something. She says that now it has been 3 days and she has a rash that is all over her arms and feet. She wants to know if you would do a referral to a dermatologist. Prefers [place]. Patient is aware that you are out until tomorrow morning.   Please advise.  Initial call taken by: Lacretia Nicks,  April 22, 2010 11:42 AM  Follow-up for Phone Call        This patient should be seen in our office. I do not think any dermatologist would seen her for an acute visit in short order. The majority have several week waiting lists.  This type of call should be put on an available MD schedule. Follow-up by: Owens Loffler MD,  April 22, 2010 4:42 PM  Additional Follow-up for Phone Call Additional follow up Details #1::        Tried calling patient. LMOM for patient to return call.  Lacretia Nicks  April 23, 2010 8:39 AM   [facility] for patient to return my call.  Lacretia Nicks  April 23, 2010 10:45 AM   Spoke with patient, she says that she does not need an appt now. The rash is doing much better.   Additional Follow-up by: Lacretia Nicks,  April 23, 2010 1:45 PM

## 2010-11-11 NOTE — Letter (Signed)
Summary: Out of Work  [employer] [facility]  [address] [place], [place] [address]   Phone: [phone]  Fax: [phone]    November 18, 2009   Employee:  KERIANNE STOKE [facility]    To Whom It May Concern:   For Medical reasons, please excuse the above named employee from work for the following dates:  Start:   11/18/2009  End:   11/20/2009  If you need additional information, please feel free to contact our office.         Sincerely,    Arnette Norris MD

## 2010-11-11 NOTE — Miscellaneous (Signed)
Summary: Consent to Special Procedure/[facility]  Consent to Special Procedure/[place] By: Edmonia James 02/10/2010 12:42:14  _____________________________________________________________________  External Attachment:    Type:   Image     Comment:   External Document

## 2010-11-11 NOTE — Progress Notes (Signed)
Summary: notice of possible drug interaction  Phone Note From Pharmacy   Caller: [facility] Summary of Call: Notice of possible drug interaction between potassium and triamterene/ hctz.  Form is on your desk. Initial call taken by: Marty Heck CMA,  December 16, 2009 4:48 PM

## 2010-11-11 NOTE — Assessment & Plan Note (Signed)
Summary: TB SKIN TEST  CYD   Nurse Visit   Allergies: 1)  ! Celebrex  Immunizations Administered:  PPD Skin Test:    Vaccine Type: PPD    Site: left forearm    Mfr: Sanofi Pasteur    Dose: 0.1 ml    Route: ID    Given by: Marty Heck CMA    Exp. Date: 03/06/2012    Lot #: [id number]  Orders Added: 1)  TB Skin Test [86580] 2)  Admin 1st Vaccine [phone]

## 2010-11-11 NOTE — Assessment & Plan Note (Signed)
Summary: per check out/saf  Medications Added TIMOPTIC-XE 0.5 % SOLG (TIMOLOL MALEATE) use as directed      Allergies Added:   Visit Type:  Follow-up Primary Kaytee Taliercio:  schaller, MD  CC:  no complaints.  History of Present Illness: [name] is a 70 yo female with a h/o parox AFib maintaining NSR on Tikosyn therapy.  She was last seen in 08/2009.  Her cardizem was stopped and she was placed on maxzide for blood pressure.  She had a h/o bradycardia.  Since last being seen, she denies chest pain, dyspnea, orhtopnea, PND, edema, palpitations or syncope.  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 of Rheumatoid Arthritis  (ICD-714.0) 13)  Hyperlipidemia  (ICD-272.2) 14)  Health Screening  (ICD-V70.0)  Current Medications (verified): 1)  Tikosyn 500 Mcg  Caps (Dofetilide) .... Take 1 Tablet By Mouth Two Times A Day 2)  Pindolol 5 Mg Tabs (Pindolol) .... Take 1 Tab Two Times A Day 3)  Bayer Aspirin 325 Mg  Tabs (Aspirin) .... Take 1 Tablet By Mouth Once A Day 4)  Klor-Con M20 20 Meq  Tbcr (Potassium Chloride Crys Cr) .... 2 Tablets Daily By Mouth 5)  Vitamin B Complex-C   Caps (B Complex-C) .... Otc As Directed. 6)  Timoptic-Xe 0.5 % Solg (Timolol Maleate) .... Use As Directed 7)  Maxzide-25 37.5-25 Mg Tabs (Triamterene-Hctz) .... Take 1/2 Once Daily 8)  Vitamin D (Ergocalciferol) 50000 Unit Caps (Ergocalciferol) .Marland Kitchen.. 1 Cap By Mouth Weekly X 12 Weeks  Allergies (verified): 1)  ! 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 Caryl Comes Pneumonia 2009 Propensity towards bradycardia Thromboembolic risk factors notable for,       a.     Age.       b.     Hypertension.       c.     Gender for CHADS-VAS score of 3 Anemia  Past Surgical History: Last updated: 04/20/2008 Partial hysterectomy--1979 CATH nml 2/09 Thoracentesis   12/20/07 HOSP R Pneumonia/Pleural Effusion w/ pain Afib w/ rapid response Anemia 3/5-3/13/09 HOSP Afib, recurr 3/17-3/19/09 colonoscopy--04/18/08--jacobs--neg  Family History: Last updated: 02/15/2008 No FH of Colon Cancer: Family History of Pancreatic Cancer: Family History of Diabetes:  Family History of Heart Disease:   Social History: Last updated: 08/05/2009 Marital Status: widow x 1 yr Children: 70, grandchildren 92, numerous great grand children 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 [place] nonsmoker, nondrinker --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 [employer]--working 5d/wk--1-2 visits qd  Risk Factors: Alcohol Use: 0 (12/24/2008) Caffeine Use: 0 (12/24/2008) Diet: low fat, low salt (12/24/2008) Exercise: yes (12/24/2008)  Risk Factors: Smoking Status: never (12/24/2008) Passive Smoke Exposure: no (12/24/2008)  Vital Signs:  Patient profile:   70 year old female Height:      63.25 inches Weight:  187.50 pounds BMI:     33.07 Pulse rate:   58 / minute BP sitting:   144 / 74  (left arm) Cuff size:   regular  Vitals Entered By: Hansel Feinstein CMA (August 19, 2010 2:17 PM)  Physical Exam  General:  Well nourished, well developed, in no acute distress HEENT: normal Neck: no JVD Cardiac:  normal S1, S2; RRR; 2/6 systolic murmur RUSB Lungs:   clear to auscultation bilaterally, no wheezing, rhonchi or rales Abd: soft, nontender, no hepatomegaly Ext: no edema Skin: warm and dry Neuro:  CNs 2-12 intact, no focal abnormalities noted    EKG  Procedure date:  08/19/2010  Findings:      Normal sinus rhythm with rate of:  57 normal axis NSSTTW changes QTc 397  Impression & Recommendations:  Problem # 1:  ATRIAL FIBRILLATION (ICD-427.31)  Maintaining NSR on Tikosyn. Check elytes. QTc is stable.   Orders: EKG w/ Interpretation (93000) TLB-BMP (Basic Metabolic Panel-BMET) (99991111) TLB-Magnesium (Mg) (83735-MG) Echocardiogram (Echo)  Problem # 2:  SINUS BRADYCARDIA (ICD-427.81)  Asymptomatic.  Problem # 3:  HYPERTENSION (ICD-401.9)  Optimal when last seen at PCP for physical exam. Continue present medicines.  Problem # 4:  AORTIC STENOSIS (ICD-424.1)  Mean gradient in 11/2007 was 9 mmHg. Repeat echo to f/u on mild AS.  Patient Instructions: 1)  Your physician recommends that you have TMP and Mag checked today.  2)  Your physician recommends that you continue on your current medications as directed. Please refer to the Current Medication list given to you today. 3)  Your physician has requested that you have an echocardiogram.  Echocardiography is a painless test that uses sound waves to create images of your heart. It provides your doctor with information about the size and shape of your heart and how well your heart's chambers and valves are working.  This procedure takes approximately one hour. There are no restrictions for this procedure. 4)  Your physician wants you to follow-up in: 1 year   You will receive a reminder letter in the mail two months in advance. If you don't receive a letter, please call our office to schedule the follow-up appointment.

## 2010-11-11 NOTE — Assessment & Plan Note (Signed)
Summary: [name] FOLLOW UP / LFW   Vital Signs:  Patient profile:   70 year old female Height:      63.25 inches Weight:      190.2 pounds BMI:     33.55 Temp:     98.7 degrees F oral Pulse rate:   88 / minute Pulse rhythm:   regular BP sitting:   130 / 88  (left arm) Cuff size:   large  Vitals Entered By: Zenda Alpers CMA Deborra Medina) (February 05, 2010 3:27 PM)  History of Present Illness: Chief complaint 6 month follow up Feeling well overall  Afib well controlled on tikosyn. Recently stopped cardiazem and change to maxide to be potassium sparing.   ? RA..taken off prednisone buy rheumatologist. Doing well off meds. Minimal joint pain.   6 years ago had mass removed left foot..benign.  In last 2 months, grown back. No path report in EMR or old chart per review.   Hypertension History:      She denies headache, chest pain, palpitations, dyspnea with exertion, orthopnea, peripheral edema, neurologic problems, syncope, and side effects from treatment.  well controlled 130/80.        Positive major cardiovascular risk factors include female age 8 years old or older, hyperlipidemia, and hypertension.  Negative major cardiovascular risk factors include negative family history for ischemic heart disease and non-tobacco-user status.     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 Take 1 Tablet By Mouth Two Times A Day 2)  Pindolol 5 Mg Tabs (Pindolol) .... Take 1 Tab Two Times A Day 3)  Bayer Aspirin 325 Mg  Tabs (Aspirin) .... Take 1 Tablet By Mouth Once A Day 4)  Klor-Con M20 20 Meq  Tbcr (Potassium Chloride Crys Cr) .... 2 Tablets Daily By Mouth 5)  Vitamin B Complex-C   Caps (B Complex-C) .... Otc As Directed. 6)  Timolol Maleate 0.25 % Soln (Timolol Maleate) .... Use 1 Gtt Ou Once Daily 7)  Maxzide-25 37.5-25 Mg Tabs (Triamterene-Hctz) .... Take 1/2 Once Daily  Allergies: 1)  ! Celebrex  Past History:  Past medical, surgical, family and social histories (including risk factors) reviewed, and no changes noted (except as noted below).  Past Medical History: Reviewed history from 12/01/2008 and no changes required. 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 Caryl Comes Pneumonia 2009 Propensity towards bradycardia Thromboembolic risk factors notable for,       a.     Age.       b.     Hypertension.       c.     Gender for CHADS-VAS score of 3 Anemia  Past Surgical History: Reviewed history from 04/20/2008 and no changes required. Partial hysterectomy--1979 CATH nml 2/09 Thoracentesis   12/20/07 HOSP R Pneumonia/Pleural Effusion w/ pain Afib  w/ rapid response Anemia 3/5-3/13/09 HOSP Afib, recurr 3/17-3/19/09 colonoscopy--04/18/08--jacobs--neg  Family History: Reviewed history from 02/15/2008 and no changes required. No FH of Colon Cancer: Family History of Pancreatic Cancer: Family History of Diabetes:  Family History of Heart Disease:   Social History: Reviewed history from 08/05/2009 and no changes required. 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 CV:  Denies chest pain or discomfort. Resp:  Denies shortness of breath, sputum productive, and wheezing. GI:  Denies abdominal pain. GU:  Denies dysuria.  Physical Exam  General:  overweight female in NAD Nose:  External nasal examination shows no deformity or inflammation. Nasal mucosa are pink and moist without lesions or exudates. Mouth:  Oral mucosa and oropharynx without lesions or exudates.  Teeth in good repair. Neck:  no carotid bruit or thyromegaly no cervical or supraclavicular lymphadenopathy  Lungs:  Normal respiratory effort, chest expands symmetrically. Lungs are clear to auscultation, no crackles or wheezes. Heart:  Normal rate and regular rhythm. S1 and S2 normal without gallop, murmur, click, rub or other extra sounds. Abdomen:  Bowel sounds positive,abdomen soft and non-tender without masses, organomegaly or hernias noted. Pulses:  R and L posterior tibial pulses are full and equal bilaterally  Extremities:  trace left pedal edema and trace right pedal edema.  B varicosities  Skin:  left medial foot..irritaed nevu vs skin tag, pstalk patrially lacerated scabbed, uniform color and borders.    Impression & Recommendations:  Problem # 1:  HYPERTENSION (ICD-401.9)  Well controlled. Continue current medication.  Her updated medication list for this problem includes:    Pindolol 5 Mg Tabs (Pindolol) .Marland Kitchen... Take 1 tab two times a day    Maxzide-25 37.5-25 Mg Tabs (Triamterene-hctz) .Marland Kitchen... Take 1/2 once daily  BP today: 130/88 Prior BP: 146/80 (11/18/2009)  10 Yr Risk Heart Disease: Not enough information  Labs Reviewed: K+: 3.7 (08/14/2009) Creat: : 1.3 (08/14/2009)   Chol: 215  (08/14/2009)   HDL: 81.20 (08/14/2009)   LDL: DEL (08/23/2007)   TG: 51.0 (08/14/2009)  Problem # 2:  ATRIAL FIBRILLATION (ICD-427.31) Stable per cards.  Her updated medication list for this problem includes:    Tikosyn 500 Mcg Caps (Dofetilide) .Marland Kitchen... Take 1 tablet by mouth two times a day    Pindolol 5 Mg Tabs (Pindolol) .Marland Kitchen... Take 1 tab two times a day    Bayer Aspirin 325 Mg Tabs (Aspirin) .Marland Kitchen... Take 1 tablet by mouth once a day  Problem # 3:  HYPOKALEMIA, MILD (ICD-276.8) Due for reeval on maxide.   Problem # 4:  HYPERLIPIDEMIA (P102836.2)  Due for reeval.   Labs Reviewed: SGOT: 19 (08/23/2007)   SGPT: 15 (08/23/2007)  10 Yr Risk Heart Disease: Not enough information   HDL:81.20 (08/14/2009), 76.60 (12/30/2008)  LDL:DEL (08/23/2007)  Chol:215 (08/14/2009), 224 (12/30/2008)  Trig:51.0 (08/14/2009), 74.0 (12/30/2008)  Complete Medication List: 1)  Tikosyn 500 Mcg Caps (Dofetilide) .... Take 1 tablet by mouth two times a day 2)  Pindolol 5 Mg Tabs (Pindolol) .... Take 1 tab two times a day 3)  Bayer Aspirin 325 Mg Tabs (Aspirin) .... Take 1 tablet by mouth once  a day 4)  Klor-con M20 20 Meq Tbcr (Potassium chloride crys cr) .... 2 tablets daily by mouth 5)  Vitamin B Complex-c Caps (B complex-c) .... Otc as directed. 6)  Timolol Maleate 0.25 % Soln (Timolol maleate) .... Use 1 gtt ou once daily 7)  Maxzide-25 37.5-25 Mg Tabs (Triamterene-hctz) .... Take 1/2 once daily  Other Orders: Shave Skin Lesion <0.5cm Scalp/neck/hands/feet/genitalia (11305)  Hypertension Assessment/Plan:      The patient's hypertensive risk group is category B: At least one risk factor (excluding diabetes) with no target organ damage.  Today's blood pressure is 130/88.  Her blood pressure goal is < 140/90.  Patient Instructions: 1)  Fasting lipids, CMET, cbc Dx 272.0, 401.1 2)  Scheduled CPX, DVE no pap.  in next few months.  3)  Antibiotic ointment and bandaid. Wash with warm water daily.   Current  Allergies (reviewed today): ! CELEBREX

## 2010-11-11 NOTE — Assessment & Plan Note (Signed)
Summary: CPX DVE NO PAP/RBH   Vital Signs:  Patient profile:   70 year old female Height:      63.25 inches Weight:      189.4 pounds BMI:     33.41 Temp:     98.7 degrees F oral Pulse rate:   80 / minute Pulse rhythm:   regular BP sitting:   116 / 70  (left arm) Cuff size:   regular  Vitals Entered By: Zenda Alpers CMA Deborra Medina) (May 11, 2010 3:26 PM)  History of Present Illness: Chief complaint cpx with out pap  Afib stable per cards...on tikosyn.  ? RA, stable off prednisone... not seeing rheumatologist.She was told only to return if having issues.   Hypertension History:      She denies headache, chest pain, palpitations, dyspnea with exertion, peripheral edema, neurologic problems, syncope, and side effects from treatment.  Well controlled. Marland Kitchen        Positive major cardiovascular risk factors include female age 69 years old or older or older, hyperlipidemia, and hypertension.  Negative major cardiovascular risk factors include negative family history for ischemic heart disease and non-tobacco-user status.     Problems Prior to Update: 1)  Skin Lesion, Benign  (ICD-709.9) 2)  Sinus Bradycardia  (ICD-427.81) 3)  Hypokalemia, Mild  (ICD-276.8) 4)  Hypertension  (ICD-401.9) 5)  Atrial Fibrillation  (ICD-427.31) 6)  Other Screening Mammogram  (ICD-V76.12) 7)  Anemia, Iron Deficiency  (ICD-280.9) 8)  Special Screening Malig Neoplasms Other Sites  (ICD-V76.49) 9)  Unspecified Glaucoma  (ICD-365.9) 10)  ? of Rheumatoid Arthritis  (ICD-714.0) 11)  Hyperlipidemia  (ICD-272.2) 12)  Health Screening  (ICD-V70.0)  Current Medications (verified): 1)  Tikosyn 500 Mcg  Caps (Dofetilide) .... Take 1 Tablet By Mouth Two Times A Day 2)  Pindolol 5 Mg Tabs (Pindolol) .... Take 1 Tab Two Times A Day 3)  Bayer Aspirin 325 Mg  Tabs (Aspirin) .... Take 1 Tablet By Mouth Once A Day 4)  Klor-Con M20 20 Meq  Tbcr (Potassium Chloride Crys Cr) .... 2 Tablets Daily By Mouth 5)  Vitamin B Complex-C    Caps (B Complex-C) .... Otc As Directed. 6)  Timolol Maleate 0.25 % Soln (Timolol Maleate) .... Use 1 Gtt Ou Once Daily 7)  Maxzide-25 37.5-25 Mg Tabs (Triamterene-Hctz) .... Take 1/2 Once Daily  Allergies: 1)  ! Celebrex  Past History:  Past medical, surgical, family and social histories (including risk factors) reviewed, and no changes noted (except as noted below).  Past Medical History: Reviewed history from 12/01/2008 and no changes required. 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 Caryl Comes Pneumonia 2009 Propensity towards bradycardia Thromboembolic risk factors notable for,       a.     Age.       b.     Hypertension.       c.     Gender for CHADS-VAS score of 3 Anemia  Past Surgical History: Reviewed history from 04/20/2008 and no changes required. Partial hysterectomy--1979 CATH nml 2/09 Thoracentesis   12/20/07 HOSP R Pneumonia/Pleural Effusion w/ pain Afib w/ rapid response Anemia 3/5-3/13/09 HOSP Afib, recurr 3/17-3/19/09 colonoscopy--04/18/08--jacobs--neg  Family History: Reviewed history from 02/15/2008 and no changes required. No FH of Colon Cancer: Family History of Pancreatic Cancer: Family History of Diabetes:  Family History of Heart Disease:   Social History: Reviewed history from 08/05/2009 and no changes required. 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 CV:  Denies chest pain or discomfort. Resp:  Denies shortness of breath. GI:   Complains of diarrhea; denies abdominal pain, bloody stools, constipation, nausea, and vomiting; Looser bowel movements in last 2 months.. GU:  Denies abnormal vaginal bleeding and dysuria.  Physical Exam  General:  overweight appearing female in NAD  Eyes:  No corneal or conjunctival inflammation noted. EOMI. Perrla. Funduscopic exam benign, without hemorrhages, exudates or papilledema. Vision grossly normal. Ears:  External ear exam shows no significant lesions or deformities.  Otoscopic examination reveals clear canals, tympanic membranes are intact bilaterally without bulging, retraction, inflammation or discharge. Hearing is grossly normal bilaterally. Nose:  External nasal examination shows no deformity or inflammation. Nasal mucosa are pink and moist without lesions or exudates. Mouth:  Oral mucosa and oropharynx without lesions or exudates.  Teeth in good repair. Neck:  no carotid bruit or thyromegaly no cervical or supraclavicular lymphadenopathy  Lungs:  Normal respiratory effort, chest expands symmetrically. Lungs are clear to auscultation, no crackles or wheezes. Heart:  Normal rate and regular rhythm. S1 and S2 normal without gallop, murmur, click, rub or other extra sounds. Abdomen:  Bowel sounds positive,abdomen soft and non-tender without masses, organomegaly or hernias noted. Msk:  No deformity or scoliosis noted of thoracic or lumbar spine.   Pulses:  R and L posterior tibial pulses are full and equal bilaterally  Extremities:  no edema Skin:  Intact without suspicious lesions or rashes   Impression & Recommendations:  Problem # 1:  HYPERTENSION (ICD-401.9)  Well controlled. Continue current medication.  Her updated medication list for this problem includes:    Pindolol 5 Mg Tabs (Pindolol) .Marland Kitchen... Take 1 tab two times a day    Maxzide-25 37.5-25 Mg Tabs (Triamterene-hctz) .Marland Kitchen... Take 1/2 once daily  BP today: 116/70 Prior BP: 130/88 (02/05/2010)  Prior 10 Yr Risk Heart  Disease: Not enough information (02/05/2010)  Labs Reviewed: K+: 4.6 (04/26/2010) Creat: : 1.4 (04/26/2010)   Chol: 226 (04/26/2010)   HDL: 70.10 (04/26/2010)   LDL: DEL (08/23/2007)   TG: 70.0 (04/26/2010)  Problem # 2:  ATRIAL FIBRILLATION (ICD-427.31) Stable per cards.  Her updated medication list for this problem includes:    Tikosyn 500 Mcg Caps (Dofetilide) .Marland Kitchen... Take 1 tablet by mouth two times a day    Pindolol 5 Mg Tabs (Pindolol) .Marland Kitchen... Take 1 tab two times a day    Bayer Aspirin 325 Mg Tabs (Aspirin) .Marland Kitchen... Take 1 tablet by mouth once a day  Problem # 3:  HYPERLIPIDEMIA (ICD-272.2) Reviewed labs.Well controlled. Continue current medication. tri and HDl, LDL trending back up. Work on lifestyle change.   Problem # 4:  ABDOMINAL BLOATING (ICD-787.3) Keep food diary to determine food assocaitions. Avoid greasy foods. Start align.   Problem # 5:  Preventive Health Care (ICD-V70.0) Assessment: Comment Only The patient's preventative maintenance and recommended screening tests for an annual wellness exam were reviewed in full today. Brought up to date unless services declined.  Counselled on the importance of diet, exercise, and its role in overall health and mortality. The patient's FH and SH was reviewed, including their home life, tobacco  status, and drug and alcohol status.     Complete Medication List: 1)  Tikosyn 500 Mcg Caps (Dofetilide) .... Take 1 tablet by mouth two times a day 2)  Pindolol 5 Mg Tabs (Pindolol) .... Take 1 tab two times a day 3)  Bayer Aspirin 325 Mg Tabs (Aspirin) .... Take 1 tablet by mouth once a day 4)  Klor-con M20 20 Meq Tbcr (Potassium chloride crys cr) .... 2 tablets daily by mouth 5)  Vitamin B Complex-c Caps (B complex-c) .... Otc as directed. 6)  Timolol Maleate 0.25 % Soln (Timolol maleate) .... Use 1 gtt ou once daily 7)  Maxzide-25 37.5-25 Mg Tabs (Triamterene-hctz) .... Take 1/2 once daily  Other Orders: Radiology Referral  (Radiology)  Hypertension Assessment/Plan:      The patient's hypertensive risk group is category B: At least one risk factor (excluding diabetes) with no target organ damage.  Today's blood pressure is 116/70.  Her blood pressure goal is < 140/90.  Patient Instructions: 1)  Referral Appointment Information 2)  Day/Date: 3)  Time: 4)  Place/MD: 5)  Address: 6)  Phone/Fax: 7)  Patient given appointment information. Information/Orders faxed/mailed.  8)  Call insurance if interested in shingles vaccine: Zostavax. 9)  Work on low cholesterol diet and exercsie, weight loss. Recheck chol in 1 year. 10)  May try align for irritable bowel symptoms. 11)   Keep food diary. 12)  Follow up if not improving.   Current Allergies (reviewed today): ! 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 Caryl Comes       Pneumonia 2009       Propensity towards bradycardia       Thromboembolic risk factors notable for,             a.     Age.             b.     Hypertension.             c.     Gender for CHADS-VAS score of 3       Anemia  Past Surgical History:    Reviewed history from 04/20/2008 and no changes required:       Partial hysterectomy--1979       CATH nml 2/09       Thoracentesis   12/20/07       HOSP R Pneumonia/Pleural Effusion w/ pain Afib w/ rapid response Anemia 3/5-3/13/09       HOSP Afib, recurr 3/17-3/19/09       colonoscopy--04/18/08--jacobs--neg

## 2010-11-11 NOTE — Letter (Signed)
Summary: Results Follow up Letter  [place] [facility]  [address] [place], [place] [address]   Phone: [phone]  Fax: [phone]    06/15/2010 MRN: [id number]  [facility] [address] [place], [place]  [address]  Dear Ms. [facility],  The following are the results of your recent test(s):  Test         Result    Pap Smear:        Normal _____  Not Normal _____ Comments: ______________________________________________________ Cholesterol: LDL(Bad cholesterol):         Your goal is less than:         HDL (Good cholesterol):       Your goal is more than: Comments:  ______________________________________________________ Mammogram:        Normal __X___  Not Normal _____ Comments: Repeat in 1 year  ___________________________________________________________________ Hemoccult:        Normal _____  Not normal _______ Comments:    _____________________________________________________________________ Other Tests:    We routinely do not discuss normal results over the telephone.  If you desire a copy of the results, or you have any questions about this information we can discuss them at your next office visit.   Sincerely,       Arnetha Courser, [facility]) for Dr. Eliezer Lofts

## 2010-11-11 NOTE — Assessment & Plan Note (Signed)
Summary: has not been feeling well/alc   Vital Signs:  Patient profile:   70 year old female Height:      63.25 inches Weight:      191.38 pounds BMI:     33.76 Temp:     98.4 degrees F oral Pulse rate:   88 / minute Pulse rhythm:   regular BP sitting:   146 / 80  (left arm) Cuff size:   large  Vitals Entered By: Christena Deem CMA Deborra Medina) (November 18, 2009 3:34 PM) CC: Has not been feeling well   History of Present Illness: 70 yo female with 2 days of not feeling well.  Started with sinus congestion, sneezing, sinus headache. When first started, she had an episode of afib with RVR, no CP or SOB. Resolved on its own a few hours later.  No fevers, chills, cough, sore throat, or ear pain. No n/v/d. No abdominal pain. No body aches. Did have her flu shot this year. Not taking anything OTC because she is afraid it will worsen her afib.  Current Medications (verified): 1)  Tikosyn 500 Mcg  Caps (Dofetilide) .... Take 1 Tablet By Mouth Two Times A Day 2)  Pindolol 5 Mg Tabs (Pindolol) .... Take 1 Tab Two Times A Day 3)  Bayer Aspirin 325 Mg  Tabs (Aspirin) .... Take 1 Tablet By Mouth Once A Day 4)  Klor-Con M20 20 Meq  Tbcr (Potassium Chloride Crys Cr) .... 2 Tablets Daily By Mouth 5)  Prednisone 1 Mg Tabs (Prednisone) .... Take 3  Tablets By Mouth Once Daily 6)  Vitamin B Complex-C   Caps (B Complex-C) .... Otc As Directed. 7)  Vitamin C 500 Mg  Tabs (Ascorbic Acid) .... Otc As Directed. 8)  Timolol Maleate 0.25 % Soln (Timolol Maleate) .... Use 1 Gtt Ou Once Daily 9)  Maxzide-25 37.5-25 Mg Tabs (Triamterene-Hctz) .... Take 1/2 Once Daily  Allergies: 1)  ! Celebrex  Review of Systems      See HPI General:  Denies chills and fever. ENT:  Complains of nasal congestion, postnasal drainage, and sinus pressure; denies earache and sore throat. CV:  Denies chest pain or discomfort, palpitations, shortness of breath with exertion, swelling of feet, and swelling of hands. Resp:   Denies cough, shortness of breath, sputum productive, and wheezing. GI:  Denies abdominal pain, diarrhea, nausea, and vomiting.  Physical Exam  General:  alert, well-developed, well-nourished, and well-hydrated. non toxic, afebrile, normotensive Ears:  TMs retracted bilaterally. Nose:  mucosal erythema Mouth:  Oral mucosa and oropharynx without lesions or exudates.  Teeth in good repair. Lungs:  Normal respiratory effort, chest expands symmetrically. Lungs are clear to auscultation, no crackles or wheezes. Heart:  Normal rate and regular rhythm. S1 and S2 normal without gallop, murmur, click, rub or other extra sounds. Extremities:  no edema Psych:  normally interactive and good eye contact.     Impression & Recommendations:  Problem # 1:  UPPER RESPIRATORY INFECTION, ACUTE (ICD-465.9) Assessment New Advised supportive care with Tylenol for frontal headache. If symptoms worsen or change, advised to call me. If she feels herself going into afib with RVR, which lasts for longer than an hour, advised to call me or cardiolgoy. Her updated medication list for this problem includes:    Bayer Aspirin 325 Mg Tabs (Aspirin) .Marland Kitchen... Take 1 tablet by mouth once a day  Complete Medication List: 1)  Tikosyn 500 Mcg Caps (Dofetilide) .... Take 1 tablet by mouth two times a day  2)  Pindolol 5 Mg Tabs (Pindolol) .... Take 1 tab two times a day 3)  Bayer Aspirin 325 Mg Tabs (Aspirin) .... Take 1 tablet by mouth once a day 4)  Klor-con M20 20 Meq Tbcr (Potassium chloride crys cr) .... 2 tablets daily by mouth 5)  Prednisone 1 Mg Tabs (Prednisone) .... Take 3  tablets by mouth once daily 6)  Vitamin B Complex-c Caps (B complex-c) .... Otc as directed. 7)  Vitamin C 500 Mg Tabs (Ascorbic acid) .... Otc as directed. 8)  Timolol Maleate 0.25 % Soln (Timolol maleate) .... Use 1 gtt ou once daily 9)  Maxzide-25 37.5-25 Mg Tabs (Triamterene-hctz) .... Take 1/2 once daily  Current Allergies (reviewed  today): ! CELEBREX

## 2010-11-11 NOTE — Miscellaneous (Signed)
   Clinical Lists Changes  Observations: Added new observation of TB PPDRESULT: negative (01/01/2010 16:12) Added new observation of PPD RESULT: < 64mm (01/01/2010 16:12) Added new observation of TB-PPD RDDTE: 01/01/2010 (01/01/2010 16:12)      PPD Results    Date of reading: 01/01/2010    Results: < 50mm    Interpretation: negative

## 2010-11-11 NOTE — Letter (Signed)
Summary: TB Skin Test  All     ,     Phone:   Fax:           TB Skin Test    [facility]    Date TB Test Placed: 12-30-2009  Left forearm  TB Test Placed by:  Marty Heck CMA  Lot #: [id number]    Expiration Date:03-06-2012  Date TB Test Read:  01-01-2010  Result 0 MM  TB Test Read by: Zenda Alpers CMA

## 2010-11-17 ENCOUNTER — Ambulatory Visit (INDEPENDENT_AMBULATORY_CARE_PROVIDER_SITE_OTHER): Payer: MEDICARE | Admitting: Family Medicine

## 2010-11-17 ENCOUNTER — Encounter: Payer: Self-pay | Admitting: Family Medicine

## 2010-11-17 DIAGNOSIS — M6530 Trigger finger, unspecified finger: Secondary | ICD-10-CM

## 2010-11-17 DIAGNOSIS — M79609 Pain in unspecified limb: Secondary | ICD-10-CM

## 2010-11-25 NOTE — Assessment & Plan Note (Signed)
Summary: PAIN IN HANDS/CLE  [facility]   Vital Signs:  Patient profile:   70 year old female Height:      63.25 inches Weight:      189.50 pounds BMI:     33.42 Temp:     97.6 degrees F oral Pulse rate:   62 / minute Pulse rhythm:   regular BP sitting:   130 / 76  (left arm) Cuff size:   large  Vitals Entered By: Zenda Alpers CMA Deborra Medina) (November 17, 2010 3:45 PM)  History of Present Illness: Chief complaint pain in right hand mostly thumb  70 year old female:  in the morning, will pop and feels swollen and pops.   Right trigger finger:  pleasant 70 year old female presents with RIGHT side thumb pain,, with a subjective feeling of swelling, and mechanical locking up  at the RIGHT 1st A1 pulley. She also has a nodule but palpable on the volar side of her hand.  REVIEW OF SYSTEMS  GEN: No systemic complaints, no fevers, chills, sweats, or other acute illnesses MSK: Detailed in the HPI GI: tolerating PO intake without difficulty Neuro: No numbness, parasthesias, or tingling associated. Otherwise the pertinent positives of the ROS are noted above.    GEN: Well-developed,well-nourished,in no acute distress; alert,appropriate and cooperative throughout examination HEENT: Normocephalic and atraumatic without obvious abnormalities. No apparent alopecia or balding. Ears, externally no deformities PULM: Breathing comfortably in no respiratory distress EXT: No clubbing, cyanosis, or edema PSYCH: Normally interactive. Cooperative during the interview. Pleasant. Friendly and conversant. Not anxious or depressed appearing. Normal, full affect.   R hand Ecchymosis or edema: neg ROM wrist/hand/digits: full  Carpals, MCP's, digits: NT Distal Ulna and Radius: NT Ecchymosis or edema: neg No instability Cysts/nodules: r1st Digit triggering: 1st Snuffbox tenderness: neg Scaphoid tubercle: NT Resisted supination: NT Full composite fist, no malrotation Grip, all digits: 5/5 str DIPJT:  NT PIP JT: NT MCP JT: NT  Hand sensation: intact   Allergies: 1)  ! Celebrex  Past History:  Past medical, surgical, family and social histories (including risk factors) reviewed, and no changes noted (except as noted below).  Past Medical History: Reviewed history from 12/01/2008 and no changes required. 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 Caryl Comes Pneumonia 2009 Propensity towards bradycardia Thromboembolic risk factors notable for,       a.     Age.       b.     Hypertension.       c.     Gender for CHADS-VAS score of 3 Anemia  Past Surgical History: Reviewed history from 04/20/2008 and no changes required. Partial hysterectomy--1979 CATH nml 2/09 Thoracentesis   12/20/07 HOSP R Pneumonia/Pleural Effusion w/ pain Afib w/ rapid response Anemia 3/5-3/13/09 HOSP Afib, recurr 3/17-3/19/09 colonoscopy--04/18/08--jacobs--neg  Family History: Reviewed history from 02/15/2008 and no changes required. No FH of Colon Cancer: Family History of Pancreatic Cancer: Family History of Diabetes:  Family History of Heart Disease:   Social History: Reviewed history from 08/05/2009 and no changes required. 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  Trigger Finger Injection, R 1st Verbal consent was obtained. Risks, benefits and alternatives were discussed. Prepped with  Betadine and Ethyl Chloride used for anesthesia. Under sterile conditions, patient injected at palmar crease aiming distally with 45 degree angle towards nodule; injected directly into tendon sheath. Medication flowed freely without resistance.  Needle size: 22 gauge 1 1/2 inch Injection: 1/2 cc of Marcaine 0.5% and 1/2 cc of Kenalog 40 mg   Orders: Injection, Tendon / Ligament (20550) Kenalog 10mg  (up to 2 units) (J3301)  Complete Medication List: 1)  Tikosyn 500 Mcg Caps (Dofetilide) .... Take 1 tablet by mouth two times a day 2)  Pindolol 5 Mg Tabs (Pindolol) .... Take 1 tab two times a day 3)  Bayer Aspirin 325 Mg Tabs (Aspirin) .... Take 1 tablet by mouth once a day 4)  Klor-con M20 20 Meq Tbcr (Potassium chloride crys cr) .... 2 tablets daily by mouth 5)  Vitamin B Complex-c Caps (B complex-c) .... Otc as directed. 6)  Timoptic-xe 0.5 % Solg (Timolol maleate) .... Use as directed 7)  Maxzide-25 37.5-25 Mg Tabs (Triamterene-hctz) .... Take 1/2 once daily 8)  Vitamin D (ergocalciferol) 50000 Unit Caps (Ergocalciferol) .Marland Kitchen.. 1 cap by mouth weekly x 12 weeks   Orders Added: 1)  Est. Patient Level III [id number] 2)  Injection, Tendon / Ligament [20550] 3)  Kenalog 10mg  (up to 2 units) [J3301]    Current Allergies (reviewed today): ! CELEBREX

## 2010-12-14 ENCOUNTER — Ambulatory Visit (INDEPENDENT_AMBULATORY_CARE_PROVIDER_SITE_OTHER): Payer: MEDICARE

## 2010-12-14 ENCOUNTER — Encounter: Payer: Self-pay | Admitting: Family Medicine

## 2010-12-14 DIAGNOSIS — Z111 Encounter for screening for respiratory tuberculosis: Secondary | ICD-10-CM

## 2010-12-16 ENCOUNTER — Encounter: Payer: Self-pay | Admitting: Family Medicine

## 2010-12-16 ENCOUNTER — Ambulatory Visit: Payer: MEDICARE

## 2010-12-21 NOTE — Assessment & Plan Note (Signed)
Summary: PPD for work   Nurse Visit   Allergies: 1)  ! Celebrex  Immunizations Administered:  PPD Skin Test:    Vaccine Type: PPD    Site: left forearm    Mfr: Sanofi Pasteur    Dose: 0.1 ml    Route: ID    Given by: Sherrian Divers CMA ([place])    Exp. Date: 12/22/2012    Lot #: [id number]  Orders Added: 1)  TB Skin Test [86580] 2)  Admin 1st Vaccine [phone]

## 2010-12-21 NOTE — Assessment & Plan Note (Signed)
Summary: ppd reading jrt   Nurse Visit   Allergies: 1)  ! Celebrex  PPD Results    Date of reading: 12/16/2010    Results: < 53mm    Interpretation: negative  The patient presented after 48 hours to check the injection site for positive or negative reaction.  Injection site examination: Nof firm bump forms at the test site. Slightly reddish appearance and diameter was smaller than 68mm.  assessment & Plan: Negative TB skin test. Patient was counseled to call if experience any irritation of site.Ozzie Hoyle LPN  March  8, [id number] PM

## 2011-01-08 ENCOUNTER — Emergency Department (HOSPITAL_COMMUNITY)
Admission: EM | Admit: 2011-01-08 | Discharge: 2011-01-08 | Disposition: A | Discharge status: Home / Self Care | Payer: MEDICARE | Attending: Emergency Medicine | Admitting: Emergency Medicine

## 2011-01-08 DIAGNOSIS — I4891 Unspecified atrial fibrillation: Secondary | ICD-10-CM | POA: Insufficient documentation

## 2011-01-08 DIAGNOSIS — H409 Unspecified glaucoma: Secondary | ICD-10-CM | POA: Insufficient documentation

## 2011-01-08 DIAGNOSIS — N390 Urinary tract infection, site not specified: Secondary | ICD-10-CM | POA: Insufficient documentation

## 2011-01-08 DIAGNOSIS — E785 Hyperlipidemia, unspecified: Secondary | ICD-10-CM | POA: Insufficient documentation

## 2011-01-08 DIAGNOSIS — R350 Frequency of micturition: Secondary | ICD-10-CM | POA: Insufficient documentation

## 2011-01-08 DIAGNOSIS — R509 Fever, unspecified: Secondary | ICD-10-CM | POA: Insufficient documentation

## 2011-01-08 DIAGNOSIS — R110 Nausea: Secondary | ICD-10-CM | POA: Insufficient documentation

## 2011-01-08 DIAGNOSIS — I10 Essential (primary) hypertension: Secondary | ICD-10-CM | POA: Insufficient documentation

## 2011-01-08 DIAGNOSIS — R300 Dysuria: Secondary | ICD-10-CM | POA: Insufficient documentation

## 2011-01-08 DIAGNOSIS — M069 Rheumatoid arthritis, unspecified: Secondary | ICD-10-CM | POA: Insufficient documentation

## 2011-01-08 LAB — URINALYSIS, ROUTINE W REFLEX MICROSCOPIC
Glucose, UA: NEGATIVE mg/dL
Protein, ur: NEGATIVE mg/dL

## 2011-01-08 LAB — URINE MICROSCOPIC-ADD ON

## 2011-01-09 ENCOUNTER — Other Ambulatory Visit: Payer: Self-pay | Admitting: Family Medicine

## 2011-01-10 LAB — URINE CULTURE

## 2011-02-22 NOTE — Consult Note (Signed)
NAMEMACKENSI, IMIG              ACCOUNT NO.:  [id number]   MEDICAL RECORD NO.:  [id number]          PATIENT TYPE:  INP   LOCATION:  4708                         FACILITY:  [facility]   PHYSICIAN:  Legrand Como B. Melvyn Novas, MD, FCCPDATE OF BIRTH:  Aug 15, 1941   DATE OF CONSULTATION:  12/19/2007  DATE OF DISCHARGE:                                 CONSULTATION   REASON FOR CONSULTATION:  Pleural effusion.   HISTORY:  The patient was admitted on December 13, 2007 with severe right  pleuritic pain, low-grade fever, and cough for several days.  She has  been treated empirically for a pneumonia, with a chest x-ray that shows  evolving atelectasis and effusion involving the right lung base.  There  is no evidence of pulmonary embolism by CT scan.   Her most recent other hospitalizations have all related to rapid atrial  fibrillation, with a negative left heart catheterization in February  2009.  She has had mild generalized swelling, but no other evidence of  heart failure clinically.  She denies any flare-up of her chronic  rheumatoid arthritis, asymmetric leg swelling, joint swelling,  stiffness, dysphagia, purulent sputum, or hemoptysis, or significant  dyspnea.  She is feeling much better since admission after having  lidocaine patch placed.   PAST MEDICAL HISTORY:  1. Rapid atrial fibrillation.  2. Anemia.  3. Hypertension.  4. Rheumatoid arthritis, followed by Dr. Justine Null.  5. Glaucoma.   ALLERGIES:  BEXTRA causes palpitations.   MEDICATIONS:  1. Avapro.  2. Avelox.  3. Colace.  4. Ecotrin.  5. K-Dur.  6. Lidoderm.  7. Lovenox.  8. Nu-Iron.  9. Prednisone at 7.5 mg daily.  10.Protonix.  11.Tiazac.  12.Tikosyn.  13.Timoptic.  14.Visken.   SOCIAL HISTORY:  This patient had never smoked.   FAMILY HISTORY:  Negative for respiratory diseases, ectopy, or  rheumatologic disease.   REVIEW OF SYSTEMS:  Taken in detail and negative, except as outlined  above   PHYSICAL EXAMINATION:   GENERAL:  This is a pleasant black female, able  to sit back in a recliner at 45 degrees with no difficulty.  She denies  pain on deep inspiration, has had no fever, and has normal vital signs.  HEENT:  Unremarkable.  Oropharynx clear.  Dentition intact.  NECK:  Supple, without cervical adenopathy or tenderness.  Trachea is  midline.  There no thyromegaly.  LUNG FIELDS:  Completely clear on the left, but on the right she had  decreased breath sounds, with dullness at the right base.  There is no  definite rub.  HEART:  There is a regular rhythm, with a 1/6 systolic ejection murmur.  ABDOMEN:  Soft, benign with no palpable organomegaly, masses, or  tenderness.  EXTREMITIES:  Warm, without calf tenderness, cyanosis, or clubbing.  There was trace edema bilaterally.   LABORATORY DATA:  Urine Streptococcus was negative.  Her original CBC on  December 13, 2007 showed no significant eosinophilia.  Sed rate, repeat BNP  and TSH are pending.   IMPRESSION:  Right pleural effusion of undetermined etiology, with  secondary atelectasis evolving in the setting of  clinical diagnosis of  recent pneumonia.  She did have fever and chills for several days, but  no definite purulent sputum to suggest that this was all a pneumonia.  However, the most likely diagnosis is that this is this is a late  parapneumonic effusion.  Differential includes a rheumatoid effusion,  although these do not usually cause such pain, and a traumatic effusion,  although she denies any injury (I suppose it is possible she coughed so  hard the injury occurred from this, although this is extremely unlikely,  given the clinical scenario).   The first step in the workup would be with diagnostic and therapeutic  thoracenteses.  I did warn the patient that this may result in recurrent  pleurisy, as the two pleural surfaces may come together after the  procedure, but she understands this and is willing to go forward with a  definitive  diagnostic procedure after full discussion of the risks,  benefits, and alternatives with her family present.   I have written for this by IR for am December 20, 2007 , but  I would hold  aspirin and Lovenox preoperatively, and have sent all the studies that  would be appropriate to evaluate the effusion to be obtained at the time  the effusion is removed.      Christena Deem. Melvyn Novas, MD, [facility]  Electronically Signed     MBW/MEDQ  D:  12/19/2007  T:  12/21/2007  Job:  [id number]   cc:   Modesto Charon, MD  Fax: [phone]

## 2011-02-22 NOTE — Discharge Summary (Signed)
[name]              ACCOUNT NO.:  [id number]   MEDICAL RECORD NO.:  [id number]          PATIENT TYPE:  INP   LOCATION:  2029                         FACILITY:  [facility]   PHYSICIAN:  Sueanne Margarita, PA   DATE OF BIRTH:  1941/06/20   DATE OF ADMISSION:  11/20/2007  DATE OF DISCHARGE:  11/23/2007                               DISCHARGE SUMMARY   ALLERGIES:  BEXTRA which causes palpitations.   However, this dictation greater than 45 minutes.   FINAL DIAGNOSIS:  1. Admitted with chest pain and palpitation.      a.     New onset atrial fibrillation this admission.      b.     Conversion to sinus rhythm after IV Cardizem bolus and drip.      c.     Recurrence of Cardizem and before pindolol could be started.      d.     Sinus rhythm on pindolol overnight.  2. Status post left heart catheterization study showed      angiographically normal coronaries, ejection fraction 70%.  3. The patient had elevated troponin studies 0.12, then 0.36 which      combined with chest pain set the patient up for catheterization.  4. Precipitous drop in hemoglobin from 12 to 9 after initiation of      heparin and Integrilin, stable at 10 for the last 36 hours.  5. Gastrointestinal consultation.      a.     Their conclusion was no evidence of gastrointestinal bleed.      b.     The recommendation outpatient colonoscopy as referred by Dr.       Council Mechanic.  6. 2-D echocardiogram November 22, 2007, ejection fraction 60%.  No      left ventricular wall motion abnormalities.   PROCEDURES:  1. November 21, 2007.  Left heart catheterization as dictated above.  2. Echocardiogram November 22, 2007.  Echocardiogram as dictated      above.   BRIEF HISTORY:  [name] is a 70 year old female.  She has a history  of hypertension.  She has a recent diagnosis of a rheumatological  condition, likely rheumatoid arthritis.  The patient developed acute  chest pain and palpitations about 4 o'clock on February  10.  The pain  was 5 out of 10 at its worst.  It is more like of burning pain than an  indigestion.  She felt palpitations especially with the pain.  She  developed some diaphoresis.  She denies nausea, vomiting or  lightheadedness.  Aspirin seemed to resolve some of her symptoms.   She arrived in the emergency room.  She was diagnosed with atrial  fibrillation and rapid ventricular rate.  This resolved with IV Cardizem  bolus and drip.  Her troponin-I studies, however, were elevated.  She  was started on IV heparin, IV Integrilin. She will continue aspirin,  continue ACE inhibitor, placed on oxygen, and because of elevated  cardiac enzymes, scheduled for left heart catheterization.  This was  done February 11 and the coronaries are angiographically normal. In  addition, the patient  exhibited a precipitous drop from 12.5 to 9.8 in  her hemoglobin.  This stabilized at 10 in the last 48 hours and her IV  heparin and Integrilin have been discontinued after the catheterization.  She had a GI consult with regard to her anemia.  They thought that  perhaps the anticoagulants were contributory to her anemia and  recommended no further inpatient workup.  The patient has never had a  colonoscopy so they recommended Dr. Council Mechanic set one up for her.  The  patient had been taken off her Cardizem after the catheterization, but  she developed recurrent atrial fibrillation with rapid ventricular rate.  The Cardizem bolus and drip were reinitiated and she converted to sinus  rhythm.  She was then started on pindolol and has remained in sinus  rhythm for the next 36 hours.  Of note, the patient's Quinapril  hydrochlorothiazide has been changed to Avapro 75 mg daily.   DISCHARGE MEDICATIONS:  1. Pindolol 2.5 mg twice daily.  2. Nu-Iron 150 mg daily for 1 month.  3. Colace 100 mg daily.  4. Avapro 75 mg daily.  5. Timolol 0.5% ophthalmic solution as before this admission.  6. Aspirin 325 mg daily.    She is to stop Quinapril hydrochlorothiazide, to stop Klor-Con.   FOLLOW UP:  1. She follows up at [facility] at [address]      Friday, February 20, 10:30 a.m. for blood work.  2. To see Dr. Caryl Comes Wednesday, March 4 at 9:20.   Her complete blood count on February 13, the day of discharge, white  cells 8.7, hemoglobin 10.3, hematocrit 30.4, platelets of 172.  Her  hemoglobin on the morning of February 12 was 10.2, troponin-I 0.12 and  0.36.  Serum electrolytes day of discharge, February 13:  140 is the  sodium, 3.5 is the potassium.  Chloride 109, bicarbonate 25, BUN is 10,  creatinine 1.14, glucose 94.  TSH this admission 0.899, hemoglobin A1c  5.9.      Sueanne Margarita, PA     GM/MEDQ  D:  11/23/2007  T:  11/25/2007  Job:  [id number]   cc:   Modesto Charon, MD  Deboraha Sprang, MD, Pollyann Kennedy, M.D.

## 2011-02-22 NOTE — Discharge Summary (Signed)
NAMEELLIOTTE, CELAYA              ACCOUNT NO.:  [id number]   MEDICAL RECORD NO.:  [id number]          PATIENT TYPE:  INP   LOCATION:  2029                         FACILITY:  [facility]   PHYSICIAN:  Sueanne Margarita, PA   DATE OF BIRTH:  07-20-1941   DATE OF ADMISSION:  11/20/2007  DATE OF DISCHARGE:  11/23/2007                               DISCHARGE SUMMARY   PRIMARY CARE PHYSICIAN:  Teresa Pelton, M.D.   ALLERGIES:  BEXTRA (causes palpitations.)   DURATION OF DISCHARGE SUMMARY:  Greater than 40 minutes.   FINAL DIAGNOSES:  1. Admitted with chest pain and palpitations.      a.     New onset diagnosis atrial fibrillation, rapid ventricular       rate.      b.     Conversion to sinus rhythm on IV Cardizem.      c.     Recurrence off Cardizem and before Pindolol could be added.      d.     Sinus rhythm, now on Pindolol last 16 hours.  2. Elevated troponin I study 0.12 and then 0.36.  3. Discharging day 2 status post left heart catheterization. The      patient had angiographically normal coronary arteries with ejection      fraction of 70%.  4. Drop in hemoglobin. The patient was on heparin and Integrilin.      a.     Stable hemoglobin of 10.2/10.3 the last 48 hours.  5. Gastrointestinal consult this admission.      a.     No evidence of gastrointestinal bleed.      b.     RECOMMEND OUTPATIENT COLONOSCOPY AS REFERRED BY DR.       Council Mechanic.  6. Echocardiogram November 22, 2007 with ejection fraction of 60%. No      left ventricular wall motion abnormalities.   SECONDARY DIAGNOSES:  1. Hypertension.  2. History of questionable rheumatoid arthritis.  3. Glaucoma.   PROCEDURE:  1. November 21, 2007, left heart catheterization. Study shows      angiographically normal coronaries. Ejection fraction of 70%. The      study was done for chest pain with elevated troponin I studies.  2. Echocardiogram November 22, 2007. Ejection fraction of 60%. No left      ventricular wall motion  abnormalities.   CONSULTATIONS:  Gastroenterology for sudden drop in hemoglobin. no  evidence of gastrointestinal bleed. Recommend outpatient colonoscopy. Is  referred by Dr. Council Mechanic. The patient has never had colonoscopy. The  patient will go home with low dose iron for 1 month.   HISTORY OF PRESENT ILLNESS:  [name] is a 70 year old female. She has  a history of hypertension and a recent diagnosis of a rheumatological  condition. Possibly, it is rheumatoid arthritis. She was recently  initiated with prednisone for 1 month. She developed acute chest pain  and palpitations about 4:00 o'clock on November 20, 2007. Chest pain  occurred after grocery shopping and after she had put her groceries  away. Her pain felt similar to indigestion. It was located at mid chest  with some radiation down the left arm. It was 5/10 at its worst. It was  described as more of a burning pain than like indigestion. She felt  palpitations and developed some diaphoresis. She denies nausea,  vomiting, light headedness. She took an aspirin at the onset of the pain  with some resolution of her symptoms. On arrival to the emergency room,  she was found to be in atrial fibrillation, rapid ventricular rate. She  was started on Cardizem IV bolus and drip and her pain has resolved. As  well, her atrial fibrillation has converted to sinus rhythm. Her atrial  fibrillation is new onset. It is well controlled currently with IV  Diltiazem. Her drip will be decreased and she will be transitioned to an  oral dose. She will continue on her ace inhibitor and on aspirin. In  addition, her prednisone will be continued. She will be started on  heparin, Integrilin and oxygen and be set for cardiac catheterization in  the face of elevated troponin I studies.   HOSPITAL COURSE:  The patient presents with chest pain and palpitations.  She was found to have atrial fibrillation, rapid ventricular rate in the  emergency room. She  converted to sinus rhythm with IV Cardizem bolus and  drip. She was started on IV heparin and Integrilin and set for left  heart catheterization. She had a decline in her hemoglobin from 12.5 to  9.8 after the first 24 hours of IV drip. She did have catheterization  November 21, 2007. The study showed angiographically normal coronaries  with a preserved and even hyperdynamic ejection fraction of 70%. Her  hemoglobin stabilized at 10 for the last 48 hours without transfusion.  She was seen for her anemia by gastroenterology. They determined that it  was probably caused by IV anticoagulation as well as 3B-2A inhibition.  The patient needs no further workup. She will be on low dose iron. She  follows up with Dr. Caryl Comes in 3 weeks. Her Quinapril hydrochlorothiazide  has been changed to Avapro 75 mg daily. She will be taken off Quinapril  hydrochlorothiazide and taken off Klor-Con as well. The patient's  Cardizem was stopped on November 22, 2007 after her catheterization. She  reverted to atrial fibrillation, rapid ventricular rate. Once again, her  IV Cardizem was started bolus and drip and she converted after 3 or 4  hours. Pindolol was started the evening of November 23, 2007 and her IV  Cardizem was disco9ntiued 2 hours after her first dose. The patient has  been in sinus rhythm overnight and through the day today, November 23, 2007. She will be discharged on the following medications.   DISCHARGE MEDICATIONS:  1. Pindolol 2.5 mg twice daily. This is new, to control her fast heart      rate.  2. Nu-Iron 150 mg daily for 1 months. This is also new.  3. Colace 100 mg daily. Stool softener, over-the-counter.  4. Avapro 75 mg daily. This is new for blood pressure control.  5. She is to STOP Quinapril hydrochlorothiazide.  6. She is to STOP Klor-Con.  7. Timolol 0.5% ophthalmic solution as before this admission.  8. Aspirin 325 mg daily.   FOLLOWUP:  With [facility] at [address]  1. On Friday, November 30, 2007 at 10:30 for blood work, to check      potassium and creatinine.  2. To see Dr. Caryl Comes on Wednesday, December 12, 2007 at 9:20.   SPECIAL INSTRUCTIONS:  If patient has recurrent of palpitations and  chest discomfort, she is to call [phone].      Sueanne Margarita, PA     GM/MEDQ  D:  11/23/2007  T:  11/25/2007  Job:  [id number]   cc:   Modesto Charon, MD  Michael Litter, M.D.  Deboraha Sprang, MD, [facility]

## 2011-02-22 NOTE — Discharge Summary (Signed)
NAMEJISELL, ARRUE              ACCOUNT NO.:  [id number]   MEDICAL RECORD NO.:  [id number]          PATIENT TYPE:  INP   LOCATION:  4708                         FACILITY:  [facility]   PHYSICIAN:  Valerie A. Asa Lente, MDDATE OF BIRTH:  11-04-1940   DATE OF ADMISSION:  12/13/2007  DATE OF DISCHARGE:  12/21/2007                               DISCHARGE SUMMARY   DISCHARGE DIAGNOSES:  1. Right-sided pneumonia/pleural effusion with intractable thoracic      pain status post thoracentesis December 20, 2007.  2. Atrial fibrillation with rapid ventricular response status post      cardiology evaluation during this admission, initially by Dr.      Jenell Milliner.  Rate is currently controlled on Tikosyn, Cardizem and      pindolol.  3. Anemia of chronic disease with iron deficiency.  4. Rheumatoid arthritis.  5. Glaucoma.  6. Hypertension.   HISTORY OF PRESENT ILLNESS:  [name] is a 70 year old female who was  admitted on December 13, 2007, with chief complaint of right rib pain x2  days which was worse with inspiration or movement.  She had recently  been hospitalized and discharged 1 day prior to this admission by  cardiology for treatment of her atrial fibrillation and possible  bronchitis.  She reported right rib pain which started on March 4 as  intermittent and sharp and was constant at time of admission.  The  patient's daughters reported recent fever and chills but none in the 24  hours prior.  Admitted for further evaluation and treatment.   PAST MEDICAL HISTORY:  1. Atrial fibrillation.  2. Anemia.  3. Hypertension.  4. Rheumatoid arthritis on chronic prednisone.  5. Glaucoma.  6. Normal cardiac catheterization February 2009.   COURSE OF HOSPITALIZATION:  1. Right-sided pneumonia/effusion with intractable pain.  The patient      was admitted and was placed on Avelox.  Although she continued to      improve clinically, her right pleural effusion slowly enlarged.  A      pulmonary  consult was requested on December 09, 2007, and the patient      was seen by Dr. Christinia Gully.  He recommended that the patient have      an ultrasound-guided thoracentesis and that this be tapped until      dry.  The patient underwent thoracentesis on December 20, 2007, which      yielded 330 mL of serosanguineous fluid.  The fluid was noted to      have increased white blood cell count and culture data is pending      at the time of this dictation.  This will need follow-up at her      visit on March 19 with [facility].  She has currently      received a 10-day treatment of Avelox.  We will continue this      treatment for an additional 4 days to complete a 14-day course.      Her oxygen saturation is stable at 94% on room air and her pain has      improved  post thoracentesis.  We will continue a Lidoderm patch and      p.r.n. Vicodin for pain.  2. Atrial fibrillation with rapid ventricular response.  The patient      was admitted and was seen by [facility].  She was started      on Cardizem during this admission which has been transitioned over      to Cardizem CD.  Her rate is currently stable.  Plan to continue      current medications at time of discharge.   MEDICATIONS AT TIME OF DISCHARGE:  1. Tikosyn 500 mcg p.o. b.i.d.  2. Avapro 75 mg p.o. daily.  3. Pindolol 2.5 mg p.o. daily in the evening and 5 mg p.o. daily in      the morning.  4. Aspirin 325 mg p.o. daily.  5. Prednisone 7.5 mg p.o. daily.  6. Iron 150 mg p.o. daily.  7. Colace 100 mg daily.  8. Protonix 40 mg p.o. daily.  9. K-Dur 20 mEq p.o. daily.  10.Timolol eyedrops one drop each eye daily.  11.Cardizem CD 120 mg p.o. daily.  12.Avelox 400 mg p.o. daily x4 days.  13.Lidocaine patch to be applied to chest wall once daily for 12 hours      per day, one week's supply prescribed.  14.Vicodin 5/325 one tablet p.o. q.4h. as needed.   PERTINENT LABORATORY AT TIME OF DISCHARGE:  TSH 2.847.  Hemoglobin  10.8,  hematocrit 32.1, BUN 14, creatinine 1.2.   DISPOSITION:  The patient will be discharged to home.   FOLLOWUP:  The patient is to follow up with Dr. Council Mechanic on Friday,  March 20, at 11:30 a.m. and with Rexene Edison, nurse practitioner, at  [facility], on December 27, 2007, for followup chest x-ray and  followup visit at 12 p.m.      Debbrah Alar, NP      Jannifer Rodney. Asa Lente, MD  Electronically Signed    MO/MEDQ  D:  12/21/2007  T:  12/22/2007  Job:  [id number]   cc:   Modesto Charon, MD  Rexene Edison, NP

## 2011-02-22 NOTE — Assessment & Plan Note (Signed)
[place] OFFICE NOTE   NAME:[name], [name]                     MRN:          [id number]  DATE:03/25/2008                            DOB:          December 28, 1940    [name] is seen in followup for atrial fibrillation.  We met earlier  this year at the request of Dr. Lia Foyer.  She is on Tikosyn.  She was  hospitalized in March for a 5-hour episode.  She has had 1 episode  since.  This occurred last week.  It lasted about 20 minutes.  It is not  clear from talking to her whether she sees this as a potentially serious  burden or a less than significant burden.  My inclination is that she  sees it as more, rather than less problematic.   MEDICATIONS:  Include Tikosyn 500, Cardizem 120, potassium, prednisone  and pindolol.   EXAMINATION:  On examination today her blood pressure was noted to be  very elevated at 173/80 and on repeat about an hour later, it was still  about 170.  Her last blood pressure in April was 106/86.  The pulse was  57.  LUNGS:  Clear.  NECK:  Veins were flat.  HEART:  Sounds were regular.  EXTREMITIES:  No edema.   Electrocardiogram dated today demonstrated sinus rhythm at 58 with  intervals of 0.11/0.07/0.42.  The axis was 55 degrees.  There was minor  ST-T flattening.   IMPRESSION:  1. Paroxysmal atrial fibrillation.  2. Tikosyn therapy for number 1.  3. Hypertension evident today, question new.  4. Prednisone therapy.  5. Anemia, previously treated with iron supplementation.   [name] has a couple of issues.  The first is going to be the atrial  fibrillation and its frequency, and whether this amount of atrial  fibrillation is going to be problematic.  Will wait and see and will  plan to see her again 3 months.  I have chosen to see her in 3 months  because her blood pressure is also poorly controlled.  I do not know to  what degree it may be aggravated by her prednisone,  but I have asked her  to check a series of outpatient blood pressure measurements and will  review these over the next 3 months or so.  I have suggested that if she  finds that her blood pressure is consistently above 150, she should call  Billie Bean in anticipation, and if therapy needs to be started, I would  recommend that we began with an ACE inhibitor or an ARB, as both these  may have some palliative effect, as it relates to her atrial  fibrillation.   As it relates to her anemia, we will plan to discontinue her iron  supplementation and just keep an eye on this.     Deboraha Sprang, MD, [facility]  Electronically Signed    SCK/MedQ  DD: 03/25/2008  DT: 03/25/2008  Job #: [id number]   cc:   Billie D. Maxie Better, [place]

## 2011-02-22 NOTE — Assessment & Plan Note (Signed)
[place] OFFICE NOTE   NAME:ENOCHHaly, Bunda                     MRN:          [id number]  DATE:08/22/2008                            DOB:          09-03-1941    [name] is fine in followup for atrial fibrillation.  She was started  on Tikosyn for this in the hospital in the spring.  She was seen 3  months ago when she has had 1 intercurrent episode that occurred when  she laid down to go to sleep.  It was gone by morning.  These episodes  were notable for palpitations, shortness of breath, some  lightheadedness, and chest discomfort.   She has also had problems with bradycardia in the past.   Her medications resultantly are pindolol 2.5 b.i.d., prednisone,  potassium, Cardizem 120, and aspirin.   On examination today, her blood pressure was 164/88, her pulse was 57.  Her lungs were clear.  Heart sounds were regular.  There was no neck  vein distention.  Extremities were without edema.   Electrocardiogram today demonstrated sinus rhythm at 57 with intervals  of 0.12/0.08/0.44.  This was measured in leads aVL and lead I.  There  are undulations in the inferolateral leads, which are present back to  April.  I assume that these represent Q-waves, they are clearly distinct  in lead aVR as a separate inscription.   IMPRESSION:  1. Paroxysmal atrial fibrillation on Tikosyn.  2. Propensity towards bradycardia.  3. Thromboembolic risk factors notable for,      a.     Age.      b.     Hypertension.      c.     Gender for CHADS-VAS score of 3.   I had a discussion with [name] that I think based on CHADS-VAS that  it will be appropriate to consider Coumadin therapy.  This will be truly  with a CHADS score now if the age were reduced to 101.  In addition, we  discussed alternatives to Tikosyn therapy, and the potential benefits of  catheter ablation versus alternative drug therapy, which might require  pacer implantation.  As related to the latter, she would like to see how  things go and we plan to sit down again in 3 months' time.   In the interim, she is to let us know over the next week what her  thoughts are about our recommendation as she began Coumadin.  We  discussed the potential benefits as well as potential risks including  but not limited to 0.5% incidence of hemorrhagic stroke.  She  understands this and will let us know next week whether she would like  to continue aspirin, which I have told her is inadequate or begin  Coumadin.       Deboraha Sprang, MD, [facility]  Electronically Signed    SCK/MedQ  DD: 08/22/2008  DT: 08/23/2008  Job #: [id number]

## 2011-02-22 NOTE — Consult Note (Signed)
[name], SENNA              ACCOUNT NO.:  [id number]   MEDICAL RECORD NO.:  [id number]          PATIENT TYPE:  INP   LOCATION:  4708                         FACILITY:  [facility]   PHYSICIAN:  Thomas C. Wall, MD, FACCDATE OF BIRTH:  [birth year]   DATE OF CONSULTATION:  DATE OF DISCHARGE:                                 CONSULTATION   CHIEF COMPLAINT:  My heart is racing.   HISTORY OF PRESENT ILLNESS:  [name] is a 70 year old lady  who was recently discharged from the Hospital on December 12, 2007 after  presenting with paroxysmal atrial fibrillation diagnosed originally on  November 20, 2007.  She converted with IV Cardizem and was started on  Pindolol and then readmitted again with atrial fib with a rapid rate and  spontaneously converted now on Tikosyn with a final QT interval of 430  milliseconds.   She was admitted by Dr. Asa Lente on December 13, 2007 with right rib pain  for 2 days, worse on inspiration.   A CT scan showed no evidence of pulmonary embolus but a question of some  right lower lobe atelectasis, consolidation, possible pneumonia.  She is  being treated for such.   This morning I was making rounds and the nurses asked me to take a look  at an EKG with her having the above complaint.  She is in atrial fib  with a narrow complex and a rapid rate.  EKG on admission showed sinus  rhythm with a QTC of about 480 milliseconds.  Her potassium is normal.   She is very symptomatic.  She is in no acute distress, however.  She  does still have her right-sided chest pain.  CT scan shows some  cardiomegaly but no definite effusion.   Her past medical history is significant for anemia, hypertension,  rheumatoid arthritis on chronic prednisone, glaucoma and a negative  cardiac cath for coronary disease 11/2007.   Her medicines on admission were Tikosyn 500 mcg b.i.d., Avapro 75 mg a  day, Pindolol 5 mg the morning, 2.5 at night, enteric-coated aspirin 325  mg a day,  prednisone 7.5 mg daily, iron 150 mg a day, Colace, Protonix  40 mg, Timolol eye drops, potassium 20 meq a day, Celoxib, azithromycin.  She is currently on Avelox.   ALLERGIES:  Bextra causes palpitations.   Her family history is really not remarkable. Look at the old chart  please.   SOCIAL HISTORY:  Lives with her daughter.  She is retired.  She does not  use tobacco or alcohol.  She regularly exercises.   REVIEW OF SYSTEMS:  Other than the HPI, all 15 points of care are  negative.   PHYSICAL EXAMINATION:  She is sitting very quiet and concerned about  taking a deep breath.  She she looks anxious intensive.  Her skin is  warm and dry.  She is really in no acute distress, otherwise.  Respiratory rate 18.  Blood pressure 121/78, pulse is 180, respiration  rate 20 and unlabored.  Her temperature today is 99.9 which is her T-  max.  O2 sats 96%  on 2 liters nasal cannula.  HEENT:  Normocephalic, atraumatic.  PERRL.  Extraocular movements  intact.  Sclera nonicteric.  Facial symmetry is normal.  Neck is supple.  Carotids were very brisk and fast, no obvious bruit.  Thyroid is not  enlarged.  Trachea is midline.  LUNGS:  Reveal decreased breath sounds because of her difficulty taking  a deep breath with pain.  HEART:  Reveals a rapid rate and rhythm.  There is no obvious rub.  ABDOMEN:  Soft, good bowel sounds, nondistended.  EXTREMITIES:  No cyanosis, clubbing or edema.  Pulses are intact.  NEUROLOGIC:  Exam is intact.  SKIN:  Unremarkable.   LABORATORY DATA:  Potassium 4.2, hemoglobin 11.3.   ASSESSMENT:  1. Recurrent paroxysmal atrial fibrillation with rapid ventricular      rate.  She has converted to IV Diltiazem in the past which we will      initiate.  She is already on Tikosyn with a QTC of at least 460-480      milliseconds.  Will need EP input to see if we can increase this      further.  2. Normal coronary arteries.  I would not check cardiac enzymes for      that  reason.  3. No evidence of pulmonary embolus.  4. Question right lower lobe pneumonia.  5. Aspirin versus Coumadin.   PLAN:  1. IV Diltiazem 20 mg then titrate to a rate less 100.  Hope she will      convert.  2. If she stays in atrial fib for greater than 24 hours we will put      her on systemic heparin.  She is on DVT Lovenox at present.  3. Continue aspirin.   Thank you for the consultation.      Thomas C. Verl Blalock, MD, [facility]  Electronically Signed     TCW/MEDQ  D:  12/15/2007  T:  12/17/2007  Job:  [id number]

## 2011-02-22 NOTE — Assessment & Plan Note (Signed)
[place]                                 ON-CALL NOTE   NAME:[name], [name]                       MRN:          [id number]  DATE:12/09/2007                            DOB:          1941-08-27    PRIMARY CARE PHYSICIAN:  Dr. Caryl Comes.   I was called by her daughter, Livingston Diones.  Ms. Tyler Deis called tonight  stating that her mother's heart rate was up this evening.  She was not  at her mother's house but another daughter was there and said that she  could not get it to go down quickly.  [name] was a history of atrial  fibrillation with RVR.  She was admitted to the hospital in February,  discharged on November 23, 2007, on Pindolol for her rate control.  This  daughter did not have information about the patient's medications nor  her heart rate or blood pressure.  She stated that her mother had  already called 911 and they would be probably going to the hospital once  the EMS team had arrived.  I stated that that was fine and they should  call me if they had any further questions and I would see her when she  came to the emergency room if she came to [facility] or [facility].  This  ended our telephone conversation.    ______________________________  Guerry Minors, MD    CGF/MedQ  DD: 12/09/2007  DT: 12/09/2007  Job #: [id number]

## 2011-02-22 NOTE — Assessment & Plan Note (Signed)
[facility]                                 ON-CALL NOTE   NAME:ENOCHJara, Mohler                       MRN:          [id number]  DATE:12/13/2007                            DOB:          [birth year]    HISTORY:  [name]  a 70 year old female who was recently hospitalized  from December 09, 2007, to December 12, 2007, by Dr. Johnsie Cancel and Dr. Caryl Comes for  atrial fibrillation and initiation of Tikosyn.  According to the  patient, she also had some problems with bronchitis and was having  pleuritic chest discomfort at the time of discharge yesterday.  She  stated that her discomfort improved throughout the day as she got up and  moved around, however, yesterday evening when she went to bed, her  discomfort became much worse.  She feels it is localized to the lower  right rib cage and is worse when she breathes.  She did not call anybody  last night nor did she try to take any Tylenol or talk to her family  about her increase in discomfort.  She was unable to sleep all night due  to the discomfort.  She calls this morning concerned.   While talking to [name], she sounds like she is in a considerable  amount of discomfort.  I had her granddaughter check her blood pressure  which was 0000000 systolically and she had a pulse of 63.  The patient felt  that it was not similar to the discomfort that prompted her admission  and represented atrial fibrillation.  I encouraged [name] to return  to the emergency room for further evaluation of her chest discomfort and  possible bronchitis exacerbation.  However, the patient was very  reluctant to do so.  She wanted to know if she could take some Tylenol  for the discomfort.  I asked her check her temperature before she tried  to take the Tylenol, but gave her permission to take two Tylenol.  I  also advised her if her discomfort was not better within an hour, she  should call us back or return to the emergency room for further  evaluation.  I also suggested that she could contact Dr. Council Mechanic for  evaluation today for exacerbation of possible bronchitis.   She stated that she does not want to come to the emergency room at this  time.  She will try taking the Tylenol and she will either contact Dr.  Melanie Crazier office when they open or call us back.      Jacqueline Nimrod, PA-C  Electronically Signed      [name]. Johnsie Cancel, MD, [facility]  Electronically Signed   EW/MedQ  DD: 12/13/2007  DT: 12/13/2007  Job #: [id number]

## 2011-02-22 NOTE — H&P (Signed)
[name], [name]              ACCOUNT NO.:  [id number]   MEDICAL RECORD NO.:  [id number]          PATIENT TYPE:  INP   LOCATION:  4705                         FACILITY:  [facility]   PHYSICIAN:  Loretha Brasil. Lia Foyer, MD, FACCDATE OF BIRTH:  [birth year]   DATE OF ADMISSION:  12/25/2007  DATE OF DISCHARGE:                              HISTORY & PHYSICAL   PRIMARY CARE PHYSICIAN:  Dr. Council Mechanic.   PRIMARY CARDIOLOGIST:  Dr. Mar Daring and Dr. Virl Axe.   HISTORY OF PRESENT ILLNESS:  This is a 70 year old African American  female with recent admission for pneumonia, A-Fib, RVR, at that time was  placed on Tikosyn, Cardizem, pindolol and has returned to normal sinus  rhythm on last admission.  She was discharged approximately 5 days ago  and noted this morning after eating her breakfast about 8:30 a.m. that  her heart rate begin to race and she felt herself go back in atrial  fibrillation.  This lasted for 2 to 3 hours and she called Dr. Olin Pia  office to report this who advised that she come to the emergency room.  On arrival to the emergency room, the patient's EKG was completed and  she was found to be in atrial fibrillation with a ventricular rate of  141 beats per minute.  The patient subsequently converted to normal  sinus rhythm on her own without any intervention.  The patient's main  complaint now is that she just feels tired and she is a little  frustrated that she has returned to atrial fibrillation despite use of  medications.  The patient states that she took her medications this a.m.  She had been complaining of some mild nausea yesterday and today and she  also had some diarrhea today.  The patient is now in normal sinus rhythm  without any complaints.  The patient does have a history of right-sided  pneumonia with a pleural effusion and intractable pain following a  thoracentesis.  The patient was sent home on antibiotics which she  completed yesterday.   REVIEW OF  SYSTEMS:  Positive for palpitations, racing heart, nausea, and  diarrhea without any further complaints.  No chest pain.  No shortness  of breath.  No dizziness.  No presyncope.  All other systems are  reviewed and found to be negative.   PAST MEDICAL HISTORY:  1. The patient had recent admission for right-sided pneumonia with      pleural effusion and intractable pain following thoracentesis.  2. Atrial fibrillation with RVR, returned to normal sinus rhythm on      that admission on Tikosyn and Cardizem.  3. Iron-deficiency anemia.  4. Rheumatoid arthritis.  5. Glaucoma.  6. Hypertension.   RECENT CARDIAC WORKUP:  The patient did have a cardiac catheterization  in November of 2009 per Dr. Johnsie Cancel which was found to be normal.  The  patient also had an echocardiogram showing 60% EF with no wall motion  abnormalities.   SOCIAL:  The patient lives in [place].  Her daughters live with her.  She is retired.  She does not smoke.  She does  not drink alcohol.   FAMILY HISTORY:  Nonsignificant secondary to age and diagnoses.   CURRENT MEDICATIONS AT HOME:  1. Tikosyn 500 mg b.i.d.  2. Pindolol 2.5 mg daily, 5 mg q.p.m.  3. Aspirin 325 mg daily.  4. Prednisone 7.5 mg daily.  5. Iron 150 mg daily.  6. Colace 100 mg daily.  7. Protonix 40 mg daily.  8. K-Dur 20 mEq daily.  9. Timolol eye drops, both eyes, once a day.  10.Cardizem CD 120 mg once a day.  11.Lidocaine patch for 12 hours during the day and then remove.  12.Vicodin 5/325 mg every 4 hours p.r.n.   ALLERGIES:  BEXTRA CAUSED SOME PALPITATIONS.   CURRENT LABS:  Hemoglobin 10.6, hematocrit 31.6, white blood cells 8.9,  platelets 272, sodium 144, potassium 3.7, chloride 112, CO2 of 34, BUN  10, creatinine 1.2, glucose 103.  EKG revealing normal sinus rhythm with  ventricular rate of 56 beats per minute with concavity in T-waves and  inferolateral leads.  Patient's QT interval was 433, QTC 418.  Chest x-  ray revealed  cardiomegaly with no edema, small right effusion, and right  basilar atelectasis versus infiltrate.   PHYSICAL EXAM:  Blood pressure 150/91.  Pulse 55.  Respirations 16.  O2  sat 97% on room air.  HEENT:  Head is normocephalic and atraumatic.  Eyes, PERRLA.  Mucous  membranes, mouth pink and moist.  Tongue is midline.  NECK:  Supple.  There is no JVD or carotid bruits appreciated.  CARDIOVASCULAR:  Regular rate and rhythm without murmurs, rubs, or  gallops.  Pulses are 1+ and equal bilaterally without bruits.  LUNGS:  Clear to auscultation essentially with some mild crackles in the  lower right base.  ABDOMEN:  Soft and nontender, 2+ bowel sounds.  EXTREMITIES:  Without clubbing, cyanosis, or edema.  NEURO:  Cranial nerves II-XII are grossly intact.   IMPRESSION:  1. Paroxysmal atrial fibrillation.  2. Hypertension.  3. History of pneumonia with right-sided pleural effusion status post      thoracentesis.  4. Rheumatoid arthritis.  5. Iron-deficiency anemia.   PLAN:  The patient is seen and examined by myself and Dr. Bing Quarry  in the emergency room.  The patient arrived with A-Fib RVR but currently  in normal sinus rhythm.  She is on moderate doses of Tikosyn, will need  readmission and reevaluation with EP service to discuss with them.  Patient has yet to have a GI workup or evaluation secondary to iron-  deficiency anemia.  This may need to be addressed during hospitalization  or as an outpatient.  We will follow making further recommendations  throughout hospital course.      Phill Myron. Purcell Nails, NP      Loretha Brasil. Lia Foyer, MD, [facility]  Electronically Signed    KML/MEDQ  D:  12/25/2007  T:  12/26/2007  Job:  [id number]   cc:   Modesto Charon, MD

## 2011-02-22 NOTE — Discharge Summary (Signed)
NAMEBRITTNEE, [name]              ACCOUNT NO.:  [id number]   MEDICAL RECORD NO.:  [id number]          PATIENT TYPE:  INP   LOCATION:  4734                         FACILITY:  [place]   PHYSICIAN:  Loretha Brasil. Lia Foyer, MD, FACCDATE OF BIRTH:  1941/03/06   DATE OF ADMISSION:  12/25/2007  DATE OF DISCHARGE:  12/27/2007                               DISCHARGE SUMMARY   ALLERGIES:  This patient has an allergy to [place] which causes  palpitations.   TIME FOR THIS DISCHARGE:  Greater than 35 minutes.   FINAL DIAGNOSES:  1. Admitted with recurrent atrial fibrillation.      a.     A five-hour episode lasting from 8:30 to 1:30 p.m. on December 25, 2007.      b.     Spontaneously reconversion to sinus rhythm in the emergency       room at 1:30 on December 25, 2007.      c.     Sinus bradycardia on the telemetry floor at [employer]. [facility].      d.     Tikosyn 500 mcg b.i.d. started last hospitalization.      e.     Also on pindolol 5 mg in the evening 2.5 mg in the morning.  2. Potassium was less than 4 on admission, though the patient is on      potassium chloride 20 mEq daily.  Note:  The patient had diarrhea      December 24, 2007.  3. In discussions with Dr. Caryl Comes, there is a question that this      patient may have failed Tikosyn.  The patient thought that maybe      her slowly resolving pneumonia could be a trigger to this of      recurrent atrial fibrillation and so one more month of Tikosyn will      be attempted with increase in pindolol to 5 mg twice daily and      increase in potassium to 40 mEq daily.   The next phase of therapy per Dr. Caryl Comes might involve rhythm or rate  control but most certainly will require backup pacing.   BRIEF HISTORY:  [name] is a 70 year old female.  She had a recent  admission for pneumonia.  At that time she was diagnosed with atrial  fibrillation with rapid ventricular rate.  She was started on Tikosyn,  Cardizem and pindolol.   She was in normal sinus rhythm on discharge.  She also has a history of rheumatoid arthritis, glaucoma, hypertension.  She had an echocardiogram which showed ejection fraction 60% with no  left ventricular wall motion abnormalities.   The patient awoke on the morning of December 25, 2007, ate breakfast and  found that she had palpitations which caused her to come to the  emergency room.  At that time, she had waited until about 1:30.  On  admission to the emergency room, her palpitations had stopped.  Her  atrial fibrillation which had recurred spontaneously reconverted to  sinus bradycardia.  With her atrial fibrillation, she feels palpitation.  She does not have dizziness.  She is not short of breath and she is not  exhibiting chest pain.  She says that the palpitations on this admission  were not as fast as on her previous admission when they were very fast  and caused chest tightness.  The patient will be monitored on the  telemetry floor.  She will be maintained on Tikosyn 500 mcg twice daily.  She will be seen by Dr. Virl Axe, [place]:  The patient presents with breakthrough atrial  fibrillation while on Tikosyn therapy.  She did reconvert after five  hours on her own.  She has been a sinus bradycardia this admission.  She  was seen by Dr. Caryl Comes who discussed the future with possible failure of  Tikosyn.  The discussion considered that the patient might be slowly  resolving right-sided pneumonia which could be perhaps a trigger for her  atrial fibrillation return.  Also the fact that she was having diarrhea  and could be mildly hypokalemic.  Her potassium has been replenished  this admission and on the day of discharge her potassium is 4.2.  Her  magnesium is 2.  The patient and Dr. Caryl Comes have discussed therapy for  atrial fibrillation if Tikosyn does not maintain sinus rhythm.  It might  involve rhythm control, it might involve further rate control  with  another antiarrhythmic, but either way, the patient might be a candidate  for permanent pacemaker.  The patient discharges December 27, 2007, on the  following medications.   DISCHARGE MEDICATIONS:  1. Tikosyn 500 mcg twice daily.  2. Pindolol 5 mg twice daily.  This is a new dose.  3. Potassium chloride 40 mEq daily.  This is a new dose.  4. Enteric-coated aspirin 325 mg daily.  5. Prednisone 7.5 mg daily.  6. Iron tablets 150 mg daily.  7. Colace 100 mg daily.  8. Protonix 40 mg daily.  9. Cardizem 120 mg daily.  10.Vicodin 5/325 one to two tablets every 4-6 hours as needed.   FOLLOW UP:  1. She has office visit with pulmonary at noon on December 27, 2007, we      are trying quickly to get her out for that.  2. She will see Dr. Johnsie Cancel Tuesday, January 01, 2008, at 4 o'clock.  3. Dr. Teresa Pelton on Friday, December 28, 2007, at 11:30.  A basic      anabolic panel will be drawn to check her potassium.  Her potassium      does need above 4.  4. She will see Dr. Caryl Comes Wednesday, January 09, 2008, at 11:45.   LABORATORY DATA:  Hemoglobin 10.6, hematocrit 31.6, white cells 8.9,  platelets of 272.  Sodium is 139, potassium 4.2, chloride 108, carbonate  23, BUN is 12, creatinine 1.35, glucose 103.  Her protime this admission  31, INR is 1.  Troponin I studies is 0.02, then 0.02, the 0.01.  Magnesium is 2.      Sueanne Margarita, PA      Loretha Brasil. Lia Foyer, MD, [facility]  Electronically Signed    GM/MEDQ  D:  12/27/2007  T:  12/27/2007  Job:  [id number]   cc:   Deboraha Sprang, MD, [place]. Johnsie Cancel, MD, [facility]  Modesto Charon, MD

## 2011-02-22 NOTE — Discharge Summary (Signed)
NAMESHEYANNA, [name]              ACCOUNT NO.:  [id number]   MEDICAL RECORD NO.:  [id number]          PATIENT TYPE:  OBV   LOCATION:  2008                         FACILITY:  [place]   PHYSICIAN:  Dyanne Carrel, MD DATE OF BIRTH:  24-Feb-1941   DATE OF ADMISSION:  12/09/2007  DATE OF DISCHARGE:                               DISCHARGE SUMMARY   PRIMARY CARDIOLOGIST:  Deboraha Sprang, MD, [facility].   CHIEF COMPLAINT:  Chest pain and palpitations.   HISTORY OF PRESENT ILLNESS:  The patient is a 70 year old female with  recently diagnosed AFib with RVR.  She was admitted on February 10th  through November 23, 2007 with AFib RVR chest discomfort.  Her troponin  was mildly elevated at 0.38 at that time.  For her AFib her heart rate  went up to 150s.  She converted with Cardizem and then was started on  pindolol.  Due to a positive troponin, she underwent cardiac  catheterization at that time which revealed normal coronaries.  She was  discharged home on the 13th and did well up until this evening around  midnight.  She was awakened with chest pain and palpitations.  The  patient reports that prior to going to bed she felt a slight twinge in  her chest, however, did not pay much attention to it and then when she  woke up this morning at midnight she had severe chest pain, some mild  nausea, and palpitations.  She denied any shortness of breath or syncope  or near syncopal episode.  She took her pindolol last evening at 7 p.m.  The family called 911 as her heart rate remained elevated and she  continued to feel unwell.  She was brought to the emergency room.  In the ER she had an EKG done which showed AFib with RVR with a heart  rate of 143.  She converted to a sinus rhythm shortly thereafter, which  revealed sinus rhythm with a rate of 66 and some nonspecific ST changes.  This is consistent with her prior EKG following conversion during her  last hospitalization.   PAST MEDICAL HISTORY:  1. Atrial fibrillation, diagnosed November 20, 2007.  She had AFib      with RVR.  She converted to sinus rhythm after Cardizem and then      pindolol.      a.     She underwent a cath at that time due to positive troponin       on November 21, 2007, which revealed left main normal, LAD normal,       left circumflex normal, RCA normal.  Her EF was 70%.  2. She had anemia with a hemoglobin of 13.9 when she was admitted      during her last hospitalization which reached a nadir of 9.5, and      then returned to 10.  GI was consulted and recommended an      outpatient colonoscopy as she had not had one done previously.  The      heparin and Integrilin were stopped and her crit remained stable  for 36 hours when she was discharged.  3. Hypertension.  4. Recent diagnosis of rheumatoid arthritis for which she has been on      prednisone 10 mg daily for the past approximately 6 weeks.  She was      just tapered down to 7.5 mg two days ago.  5. Glaucoma.   ALLERGIES:  BEXTRA which caused palpitations.   MEDICATIONS:  1. Pindolol 2.5 mg p.o. b.i.d.  2. Nu-Iron 150 mg p.o. daily.  3. Colace 100 mg p.o. daily.  4. Avapro 75 mg p.o. daily.  5. Oxybutynin 25 mg p.o. daily.  6. Timolol 0.5% to her eyes daily.  7. Prednisone 7.5 mg p.o. daily.   She lives with her granddaughter.  She is retired.  She denies any  tobacco, alcohol, or drugs.  No herbal medication use.  She stopped all  coffee or caffeine intake for which she used to drink a couple of cups  of coffee per day.  She does not take any soda in daily.  [facility] but has not returned since her  discharge from the hospital.   FAMILY HISTORY:  Her mother died of an MI at the age of 38.  She has  hypertension and dyslipidemia.  Father died from pancreatic cancer.  He  also had hypertension and CAD.  She has four sisters, one with angina at  the age of 41, a brother who had heart  problems at the age 70.   REVIEW OF SYSTEMS:  She denies fever but reports chills and some sweats  over the past couple of days.  She denies any headache or visual  changes.  No skin rashes or lesions.  CARDIOPULMONARY:  Remarkable for  the chest pain as stated in the HPI and palpitations.  She denies any  shortness of breath, dyspnea on exertion, orthopnea, no PND, no syncope.  She reports some pedal edema affecting her ankles, mostly associated  with effusions secondary to her rheumatoid arthritis.  No wheezing but  she has had coughing and some sputum production.  GU:  She denies any  dysuria or hematuria. NEURO/PSYCH:  No focal weakness.  No numbness.  She reports ankle arthralgias and some joint swelling.  GI:  She denies  any nausea, vomiting, diarrhea, no bright red blood per rectum, no  melena, no hematemesis.  All other systems are negative.   PHYSICAL EXAMINATION:  VITAL SIGNS:  Temperature 98.3, pulse 64,  respirations 14, blood pressure 136/77.  She is sating 100% on room air.  GENERAL:  She appears younger than stated age female in no acute  distress.  HEENT:  Normocephalic atraumatic.  NECK:  JVP is approximately 6-cm.  She has 2 +upstroke .  No bruits.  LUNGS:  Clear throughout without any wheezes or rhonchi.  CARDIOVASCULAR:  Normal S1 S2.  Regular rhythm.  ABDOMEN:  Soft, nontender.  No organomegaly.  EXTREMITIES:  She has left pedal edema greater than right with evidence  of an effusion around her ankle joint.  NEUROLOGIC:  Grossly nonfocal.   Chest x-ray shows no acute airspace disease.  EKG:  Rate 66, sinus,  arrhythmia, normal axis, PR interval 128, QRS 68, QTc 377 milliseconds.  Initially after converting at 1:56, she had some minimal ST depression  in the inferior leads which on repeat EKG at 2:56 has essentially  resolved.  Her EKG went to sinus rhythm.  Her EKG at 1:40 a.m. revealed  atrial fibrillation with rapid ventricular response.  She has ST   depression in the inferior and lateral leads of approximately 2-mm.  These EKGs were similar to her prior EKGs from November 20, 2007.   LABORATORY DATA:  Hematocrit 32, hemoglobin 10.9.  Potassium is 4.3,  glucose 88.  CK-MB less than 1, troponin I less than 0.05, myoglobin  69.6.   ASSESSMENT/PLAN:  The patient is a 70 year old female with recent onset  of atrial fibrillation with rapid ventricular response with normal  coronaries who now presents with recurrence of her atrial fibrillation  with rapid ventricular response.  She has since converted to sinus  rhythm.  1. Atrial fibrillation with rapid ventricular response.  It is      resolved currently.  I would try to increase her dose of pindolol      to 5 mg in the morning and 2.5 mg in the evening.  We will start      her with her first dose of 5 mg now.  Her atrial fibrillation is      transient, however, she is extremely symptomatic with it.  She has      spontaneously converted without intervention.  We will observe her      overnight on the monitor to watch her rhythm and rate.  We could      consider an antiarrhythmic drug if she continues to have      breakthrough.  Her CHAD-2 score is 1 due to hypertension, so      aspirin therapy is acceptable for now.  She has anemia and a      gastroenterology workup is pending, so she is not a candidate for      Coumadin.  2. Anemia.  Her hematocrit is stable with a hemoglobin of 10.9.  We      will check a ferritin.  We will start her on Protonix for      gastrointestinal protection.  She is scheduled for an outpatient      colonoscopy.  3. Possible bronchitis.  The patient has had cough and chills      recently.  She has no focal infiltrate on her chest x-ray.  She may      have bronchitis which also may have precipitated her atrial      fibrillation with rapid ventricular response.  We will treat her      with a Z-Pak and see how she responds.  She has not had a fever,       however, she is on prednisone which may limit this.  4. Rheumatoid arthritis.  Would highly recommend tapering her      prednisone.  She has begun a slow taper by her rheumatologist at      7.5 mg down fro 10 mg daily.  We will continue her on a 7.5 mg with      hopes of tapering her over the next couple of weeks if this is      approved by rheumatology.      Dyanne Carrel, MD  Electronically Signed     CGF/MEDQ  D:  12/09/2007  T:  12/09/2007  Job:  [id number]

## 2011-02-22 NOTE — Discharge Summary (Signed)
NAMESABEL, [name]              ACCOUNT NO.:  [id number]   MEDICAL RECORD NO.:  [id number]          PATIENT TYPE:  INP   LOCATION:  2008                         FACILITY:  [place]   PHYSICIAN:  [name], MD, FACCDATE OF BIRTH:  [birth year]   DATE OF ADMISSION:  12/09/2007  DATE OF DISCHARGE:  12/12/2007                               DISCHARGE SUMMARY   PRIMARY CARDIOLOGIST:  Deboraha Sprang, MD, [facility]. Johnsie Cancel, MD,  [facility].   PROCEDURE PERFORMED DURING HOSPITALIZATION:  None.   FINAL DISCHARGE DIAGNOSES:  1. Atrial fibrillation diagnosed on November 20, 2007.  Converted to      normal sinus rhythm after Cardizem and started on pindolol in      February.      a.     Readmission with atrial fibrillation rapid ventricular       response.  Spontaneously converted.  Now on Tikosyn with final Q-T       interval of 430.  2. Anemia.  3. Hypertension.  4. Bronchitis.  5. Rheumatoid arthritis.  6. Glaucoma.  7. Right coronary artery normal.   HOSPITAL COURSE:  This is a 70 year old Caucasian female who was  recently diagnosed with A fib with RVR in February, 2009 and did undergo  cardiac catheterization in February revealing normal coronary arteries  with an LVEF of 70%.  The patient was placed on Cardizem during that  hospitalization and sent home on Pindolol 2.5 mg b.i.d.  The patient was  in her usual state of health until experiencing severe chest discomfort,  which awakened her on the evening of December 09, 2007.  She took an extra  dose of pindolol.  She had complaints of mild nausea and palpitations  without associated shortness of breath and syncope.  Secondary to  continuation of symptoms, her family called 911 and was found to have an  elevated heart rate in the field with a rate of 143 beats per minute.  She was seen in the emergency room.  EKG was confirmed atrial  fibrillation with RVR.  She converted back to normal sinus rhythm  shortly thereafter on her own  without any medical intervention.  Her EKG  post conversion revealed no changes since prior EKG in February, 2009  with some nonspecific ST-T wave changes.  The patient was seen and  examined by Dr. Fenton Malling, cardiologist, for Dr. Glori Bickers.  The patient was admitted to evaluate further.  The patient was kept  overnight and monitored for any further recurrence of atrial  fibrillation.   The following morning, the patient was seen by EP, Dr. Caryl Comes and Marcellus [name], physician's assistant, for further evaluation and to be started  on Tikosyn loading in the setting of recurrent atrial fib with RVR.  The  patient was started on this with loading and monitoring of EKG with Q-T  interval.  The patient also had some mild hypokalemia and was given an  initial dose of 40 of potassium once and then started on 20 mEq daily.  Patient remained in normal sinus rhythm throughout the hospitalization.  The patient  had no further episodes of recurrence of A fib with RVR.  Tikosyn loading was completed during hospitalization and to be continued  as an outpatient.  The patient will go home on Tikosyn 500 mcg b.i.d.  with adjustment per Dr. Johnsie Cancel on follow-up appointment within two  weeks.  She will also have her EKG in one week to evaluate Q-T interval  as well.   On the day of discharge, the patient was seen and examined by Dr. Jenkins Rouge and found to be stable.  Blood pressure was 148/78, heart rate  56, respirations 18, temperature 98.3, O2 sat 96% on room air.  The  patient's Q-T interval was 430.  The patient was also found to have some  mild bronchitis on admission and was started on a Z pack, which will  continue for another five days post discharge.  The patient's pindolol  was also increased to 5 mg daily in the a.m. and 2.5 mg in the evening  prior to bedtime.  It was noted that the patient was not a candidate for  Coumadin.  Her CHADS score was 2 but now 1 due to  hypertension.   LABS ON DISCHARGE:  Sodium 140, potassium 3.8, chloride 109, CO2 25,  glucose 88, BUN 17, creatinine 1.16, magnesium 2.1.  Cardiac enzymes are  found to be negative.  Hemoglobin 9.8, hematocrit 29.2, white blood  cells 8.1, platelets 219.   Chest x-ray revealing no significant pulmonary edema.   EKG completed on the day of discharge.  Normal sinus rhythm with an ST-T  wave abnormality with a Q-T interval of 430.   DISCHARGE MEDICATIONS:  1. Tikosyn 500 mcg twice daily.  2. Pindolol 5 mg in the a.m. and 2.5 mg at bedtime.  3. Aspirin 325 mg daily.  4. Prednisone 7.5 mg daily.  5. Avapro 75 mg daily.  6. Iron complex 150 mg daily.  7. Colace 100 mg daily.  8. Protonix 40 mg daily.  9. Timolol eye drops to both eyes daily.  10.Zithromax 250 mg daily x5 days only.  11.Potassium 20 mEq.  12.Celoxib and valdecoxib.   FOLLOW-UP PLANS/APPOINTMENTS:  1. Patient will have an EKG completed on March 10 at 9:00 a.m.  Dr.      Johnsie Cancel will be in the office for him to review this and make      adjustments if necessary.  2. Patient is to follow up with Dr. Jenkins Rouge as an outpatient      appointment on March 24 at 4 p.m.  3. Patient is to continue to follow up with her primary care physician      for continued medical management.   Time spent with the patient, to include physician time, 40 minutes.      Phill Myron. Purcell Nails, NP      [name], MD, [facility]  Electronically Signed    KML/MEDQ  D:  12/12/2007  T:  12/12/2007  Job:  [id number]

## 2011-02-22 NOTE — Cardiovascular Report (Signed)
NAMEAUBREEANA, WAHID              ACCOUNT NO.:  [id number]   MEDICAL RECORD NO.:  [id number]          PATIENT TYPE:  INP   LOCATION:  2029                         FACILITY:  [place]   PHYSICIAN:  [name], MD, FACCDATE OF BIRTH:  04-24-1941   DATE OF PROCEDURE:  11/21/2007  DATE OF DISCHARGE:                            CARDIAC CATHETERIZATION   CORONARY ARTERIOGRAPHY:   INDICATIONS:  Chest pain, positive markers.   Cine catheterization done with 6-French catheter from the right femoral  artery.   Left main coronary was normal.   Left anterior descending artery was normal.  The first diagonal branch  was normal.   The patient had a large, left-dominant circumflex.  It was normal.   The right coronary artery was somewhat small and nondominant.  It had a  fairly high takeoff.  It was normal.   RAO ventriculography:  RAO  ventriculography showed hyperdynamic LV  function.  The EF was 70%.  There was no gradient across the aortic  valve and no MR.  Aortic pressure is 160/73, LV pressure is 160/70.   IMPRESSION:  The patient does not have significant coronary artery  disease.  She has had paroxysmal atrial fibrillation.  She is not a  candidate for Coumadin at this time due to her significant anemia.  We  will keep her in the hospital to work this up and have GI see her.  For  the time being she will not be placed on heparin or Coumadin.      [name], MD, [facility]  Electronically Signed     PCN/MEDQ  D:  11/21/2007  T:  11/22/2007  Job:  [id number]

## 2011-02-22 NOTE — Letter (Signed)
January 09, 2008    Modesto Charon, MD  [address] [place], [place] [address]   RE:  [name], [name]  MRN:  [id number]  /  DOB:  [birth year]   Dear Mikki Santee:   [name] is seen following hospitalization for atrial fibrillation  that was recurring.  She also was found to have pneumonia. She is  feeling much better and has actually held sinus rhythm now for almost 10  days.  She saw a pulmonary after she left the hospital and they felt  that her pneumonia was improving.  They are to see her again one months'  time.   She is on iron and she is supposed to have a colonoscopy, which is  apparently left for you to coordinate.   In the in-between she will be kept on her Ferrex 150 daily.  She is also  on Tikosyn 500 b.i.d., pindolol 5 b.i.d., aspirin, Protonix, and  Timoptic and Cardizem 120.   EXAMINATION:  Her blood pressure today was 106/86 with a pulse of 60.  Her lungs were clear.  Her heart sounds were regular.  EXTREMITIES:  Without edema.  The skin was warm and dry.   Electrocardiogram demonstrated sinus rhythm at 55 with an interval of  0.12/0.77/0.44.  The axis was 55 degrees.   IMPRESSION:  1. Paroxysmal atrial fibrillation on Tikosyn relative bradycardia.  2. GI bleeding.  3. She is supposed to see GI, for which she is also a colonoscopy as      an outpatient.  4. Ongoing pneumonia - improved.   Her followup has been scheduled above.  We will see her in about 2  months time to see how her atrial fibrillation is doing.  Will check a  BMET today to make sure potassium is okay.    Sincerely,      Deboraha Sprang, MD, [facility]  Electronically Signed    SCK/MedQ  DD: 01/09/2008  DT: 01/09/2008  Job #: [id number]

## 2011-02-22 NOTE — Assessment & Plan Note (Signed)
[place] OFFICE NOTE   NAME:ENOCHYoshiko, Zuno                     MRN:          [id number]  DATE:12/02/2008                            DOB:          08-27-1941    [name] is seen in followup for hospitalization recently for atrial  fibrillation for which she was started on Tikosyn.  She is doing pretty  well.  She had some complaints of shortness of breath and flutters,  which occur mostly at night.  When this happens, she takes an extra  Cardizem and goes right to sleep.   MEDICATIONS:  1. Pindolol 2.5 b.i.d.  2. Cardizem 120.  3. Tikosyn 500.  4. Calcium.  5. Prednisone.   PHYSICAL EXAMINATION:  VITAL SIGNS:  Her blood pressure is 130/80 with a  pulse of 56 and her weight is 187.  LUNGS:  Clear.  HEART:  Sounds were regular.  ABDOMEN:  Soft.  EXTREMITIES:  Without edema.   Electrocardiogram dated today demonstrated sinus rhythm at 52 with  intervals of 0.15/0.07/0.42.  The axis was 45 degrees, minor ST-T  changes.   ECG changes were unchanged.   IMPRESSION:  1. Atrial fibrillation, paroxysmal, with nocturnal palpitations      question mechanism.  2. Hypertension.  3. Bradycardia.  4. Abnormal EKG.   [name]'s nocturnal palpitations are particularly bothersome and are  quite responsive to her Cardizem.  I am going to have her take her  Cardizem at night instead of in the morning and see how that does.  Hopefully, this will avoid problems with bradycardia.   We will plan to refill her Tikosyn, pindolol, and Cardizem today.   We will see her again in 6 months' time.     Deboraha Sprang, MD, [name]  Electronically Signed    SCK/MedQ  DD: 12/02/2008  DT: 12/03/2008  Job #: [id number]

## 2011-02-22 NOTE — Consult Note (Signed)
NAMEKARYSS, [name]              ACCOUNT NO.:  [id number]   MEDICAL RECORD NO.:  [id number]          PATIENT TYPE:  INP   LOCATION:  [id number]                         FACILITY:  [facility]   PHYSICIAN:  Jacqueline Sprang, MD, FACCDATE OF BIRTH:  08-26-1941   DATE OF CONSULTATION:  12/26/2007  DATE OF DISCHARGE:                                 CONSULTATION   We were asked to see [name] by Dr. Bing [name] for recurrent  atrial fibrillation.   She is a 70 year old [place] American woman with a history of atrial  fibrillation that initially presented in February in the context of  pneumonia.  She convert spontaneously and developed recurrent atrial  fibrillation with a rapid ventricular response for which Tikosyn was  then initiated because of problems with concomitant bradycardia, the  concern being alternative antiarrhythmic drugs would require more AV  nodal blocking agents.  Her initial hospitalization was also complicated  by an elevated troponin, for which she underwent catheterization which  was normal and anemia, for which GI outpatient referral was scheduled.   She was admitted again yesterday because of another episode of rapid  atrial fibrillation.  This was identified on arrival in the emergency  room.  It is associated with significant fatigue, mild chest discomfort  and a sense of weakness.  It is not accompanied by significant shortness  of breath.   The related medical history includes:  1. This pneumonia for which she underwent pleurodesis, which has now      been followed by problems with pain; chest x-ray yesterday      demonstrated some residual infiltration apparently and she is      scheduled to see pulmonary tomorrow as an outpatient.  2. Anemia.  3. Rheumatoid arthritis.  4. Glaucoma.  5. Hypertension.   PAST SURGICAL HISTORY:  Not related.   SOCIAL HISTORY:  She is retired.  She lives with her daughter.  She has  4 children.   MEDICATIONS:  Currently  include Tikosyn 500 b.i.d., pindolol 2.5/5,  aspirin 325, prednisone 7.5, iron, Colace, Protonix 40, potassium 20,  timolol eye drops, Cardizem 120, lidocaine and Vicodin.   ALLERGIES:  SHE IS ALLERGIC TO BEXTRA.   REVIEW OF SYSTEMS:  As noted on the intake sheet and is notable for  fatigue, nausea which accompanies her atrial fibrillation, as well as  some diarrhea recently occurring in the context of her antibiotic  therapy.   PHYSICAL EXAMINATION:  On examination she is an elderly African American  female appearing her stated age of 16.  Her blood pressure today was  133/72 with a pulse of 53, respirations were 18.  HEENT:  Exam demonstrated no icterus or xanthoma.  The neck veins were  flat.  Carotids are brisk and full bilaterally without bruits.  BACK:  Without kyphosis or scoliosis.  LUNGS:  The lungs were mostly clear on the left.  There are some  crackles still on the right.  CARDIOVASCULAR:  Heart sounds were regular without murmurs or gallops.  ABDOMEN:  Soft with active bowel sounds.  EXTREMITIES:  Femoral pulses not examined.  Distal pulses were intact.  There was no clubbing, cyanosis or edema.  NEUROLOGICAL:  Exam was grossly normal.  SKIN:  Warm and dry.   Electrocardiogram dated December 25, 2007 demonstrated atrial fibrillation,  rate of 141 with interval of -0.08/0.30 with a QTC of 0.45.  Electrocardiogram following conversion to sinus rhythm demonstrated a  rate of 56 with intervals of 0.11/0.08/0.44 with a QTC of 0.42.   Laboratories on arrival were notable for potassium of 3.7, which remains  a little bit low at 3.6 today.  Her troponins were negative.  Review of  her previous labs demonstrates a normal TSH and improving hemoglobin.   IMPRESSION:  1. Atrial fibrillation - recurrent with a rapid ventricular response.  2. Bradycardia, probably close to her limit of AV nodal blocking      agents with a resting heart rate in the low 50s.  3. Pneumonia - resolving  on antibiotics.  4. Hypokalemia.  5. Diarrhea, question related to #3.  6. Anemia with GI evaluation pending.   DISCUSSION:  [name] is having recurrent paroxysms of atrial  fibrillation with a rapid ventricular response that is very symptomatic.  It is clear to me that the Tikosyn is not working; however, the family  raises the question as to whether the resolving pneumonia is not partly  responsible for the failure of the drug.  Because of that they would  like to continue the drug for another month or so, which I think it is  not unreasonable and we will wait and see how the pneumonic process  resolves, with pulmonary consultation anticipated tomorrow as an  outpatient.   Her bradycardia is obviously another part of the problem, which means  that alternative antiarrhythmic drug therapy either with a 1C requiring  significantly more  AV nodal blocking drugs and/or amiodarone will  likely require concomitant backup brady pacing.  In the context of the  AFFIRM trial, augmented rate control is not an unreasonable strategy.   The other issues are being careful about her potassium and magnesium  status given her Tikosyn therapy.  She has persistent problems with  hypokalemia and I wonder whether this may not be related to diarrhea;  any case, it needs adequate repletion.   The related issue is whether her diarrhea is related to antibiotics and  we will send a stool specimen today for Clostridium difficile.   We anticipate discharge in the morning.     Jacqueline Sprang, MD, [facility]  Electronically Signed    SCK/MEDQ  D:  12/26/2007  T:  12/26/2007  Job:  [id number]   cc:   Jacqueline Charon, MD

## 2011-02-22 NOTE — H&P (Signed)
[name], CRAUN              ACCOUNT NO.:  [id number]   MEDICAL RECORD NO.:  [id number]          PATIENT TYPE:  EMS   LOCATION:  MAJO                         FACILITY:  [facility]   PHYSICIAN:  Peter C. Johnsie Cancel, MD, FACCDATE OF BIRTH:  07/06/1941   DATE OF ADMISSION:  11/20/2007  DATE OF DISCHARGE:                              HISTORY & PHYSICAL   CHIEF COMPLAINT:  Chest pain and palpitations.  11/20/07   HISTORY OF PRESENT ILLNESS:  A 70 year old female with a history of  hypertension and recent diagnosis of a rheumatological condition, likely  rheumatoid arthritis, and recent initiation of prednisone one month  prior with development of acute severe chest pain and palpitations at  approximately 1600 on the day of admission.  Her chest pain occurred  following her grocery shopping after she had put her groceries away.  She felt that the pain was similar to indigestion, and it was located in  the middle of her chest with some radiation down her left arm.  The pain  was 5/10 at its worst.  It was described as more of a burning pain than  like an indigestion.  She felt palpitations, especially with the pain  and also developed some diaphoresis.  She denied nausea, vomiting, or  lightheadedness.  She took aspirin at the onset of the pain with some  resolution of the symptoms.  Upon arrival to the emergency room, she was  started on a Cardizem drip with a loading dose of 20 and then 20 mg/hr,  and since then, her pain has resolved.   PAST MEDICAL HISTORY:  She recently had a full physical with a negative  workup for her cholesterol and diabetes but with a positive RF because  of pain that she was complaining about in her ankles, and that is when  she was referred to Dr. Justine Null.  She has a history of hypertension and a  history of low potassium and glaucoma.   MEDICATIONS:  1. Prednisone 10 mg once daily x1 month.  2. Quinapril/HCTZ 20/25 p.o. once daily.  3. Klor-Con 20 mEq once  daily.  4. Timolol 0.5%.  5. She took an aspirin on the day of admission.  She is also on      calcium and vitamin D.   Of note, she ran out of her blood pressure medication the day before  admission.   SOCIAL HISTORY:  Lives in [place] with her daughter and granddaughters.  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  She has no history of smoking or alcohol  use.  No drug use.  She takes cod liver oil pills once daily.  [facility] any [facility] pain, shortness of  breath, lightheadedness, or any other problems while she is  participating in the exercise plan.   FAMILY HISTORY:  Mother with hypertension, high cholesterol.  Died of a  heart attack at 43.  Father deceased of pancreatic cancer.  He also had  hypertension and had angina, but she is unsure at what age the angina  started.  She has four sisters.  One has angina at age 78.  She has one  brother with heart problems that developed at age 64.   REVIEW OF SYSTEMS:  Pertinent for diffuse muscle pain, mostly in her  ankles, some pain in her right shoulder, and one week ago, she developed  neck pain that is worse with movement but has gone away.   ALLERGIES:  She has an allergy to BEXTRA, which caused palpitations in  the past.   PHYSICAL EXAMINATION:  Temperature 97.7, pulse 169, respiratory rate 18,  blood pressure 144/70.  O2 sat 100% on room air.  GENERAL APPEARANCE:  No apparent distress.  HEENT:  Within normal limits.  NECK:  Supple with no JVD or carotid bruits.  She did have a small,  linear left-sided neck mass.  Appeared to be soft tissue, mobile.  No  pain on lateral motion of her neck.  VASCULAR:  S1 and S2.  Irregular.  No carotid bruits.  LUNGS:  Clear to auscultation bilaterally.  No wheezes or crackles.  SKIN:  Benign.  ABDOMEN:  Soft, nontender, nondistended.  Normal bowel sounds.  EXTREMITIES:  She had bilateral nonpitting edema.  MUSCULOSKELETAL:  She had mild ulnar deviation of  some of her fingers.  NEURO:  Alert and oriented.   Chest x-ray showed mild cardiomegaly with a question of linear  atelectasis, with suggestion of a follow-up chest x-ray, PA and lateral.   EKG was in A fib with a heart rate of 169, ST depression in leads V4-6.  No old EKGs to compare it to.  No hypertrophy.   LABS:  Hemoglobin 12.5, hematocrit 37, white blood cell count 7.2,  platelets 189.  Sodium 140, potassium 3.7, chloride 111, bicarb 20, BUN  15, creatinine 1.2, glucose 157.  MB 1.2, troponin I less than 0.05.   ASSESSMENT/PLAN:  A 70 year old female with a history of hypertension,  rheumatoid arthritis with acute onset of severe chest pain with  radiation to her left arm, recent use of aspirin, chronic inflammatory  condition.  Atrial fibrillation is new onset.  Well controlled with  Diltiazem.  We will decrease her drip and transition her to a low dose  oral.  Continue her ACE inhibitor.  Continue her on aspirin.  Continue  her prednisone to prevent the possibility of adrenal insufficiency.  Will start her on heparin, O2, and will obtain a cardiac cath tomorrow  to rule out an myocardial infarction as the underlying etiology of her  new-onset atrial fibrillation and continue with risk stratification.      [name], M.D.  Electronically Signed      [name], MD, [facility]  Electronically Signed    JC/MEDQ  D:  11/20/2007  T:  11/20/2007  Job:  [id number]

## 2011-02-25 ENCOUNTER — Encounter: Payer: Self-pay | Admitting: *Deleted

## 2011-02-25 ENCOUNTER — Encounter: Payer: Self-pay | Admitting: Internal Medicine

## 2011-03-02 ENCOUNTER — Ambulatory Visit (INDEPENDENT_AMBULATORY_CARE_PROVIDER_SITE_OTHER): Payer: Medicare Other | Admitting: Physician Assistant

## 2011-03-02 ENCOUNTER — Encounter: Payer: Self-pay | Admitting: Physician Assistant

## 2011-03-02 VITALS — BP 124/77 | HR 64 | Ht 66.0 in | Wt 185.0 lb

## 2011-03-02 DIAGNOSIS — R079 Chest pain, unspecified: Secondary | ICD-10-CM

## 2011-03-02 DIAGNOSIS — I4891 Unspecified atrial fibrillation: Secondary | ICD-10-CM

## 2011-03-02 DIAGNOSIS — R0789 Other chest pain: Secondary | ICD-10-CM | POA: Insufficient documentation

## 2011-03-02 DIAGNOSIS — R0602 Shortness of breath: Secondary | ICD-10-CM

## 2011-03-02 LAB — BASIC METABOLIC PANEL
GFR: 55.72 mL/min — ABNORMAL LOW (ref >60.00)
Glucose, Bld: 86 mg/dL (ref 70–99)
Potassium: 4.7 mEq/L (ref 3.5–5.1)
Sodium: 142 mEq/L (ref 135–145)

## 2011-03-02 LAB — BRAIN NATRIURETIC PEPTIDE: Pro B Natriuretic peptide (BNP): 92 pg/mL (ref 0.0–100.0)

## 2011-03-02 LAB — MAGNESIUM: Magnesium: 2.1 mg/dL (ref 1.5–2.5)

## 2011-03-02 NOTE — Assessment & Plan Note (Signed)
I believe her chest pain is related to recurrent atrial fibrillation.  Her symptoms seem to be similar to what she's had in the past.  The likelihood that she has developed obstructive coronary disease in 3 years is quite low.  However, she may require adjustments in her antiarrhythmics should she be having recurrent atrial fibrillation.  She may be a candidate for class IC agents.  She does have some difficulty with shortness of breath as well.  Therefore, I will set her up for a Myoview.  I will also check a BNP.

## 2011-03-02 NOTE — Patient Instructions (Signed)
Your physician recommends that you schedule a follow-up appointment in: [place] Caryl Comes AS PER [name] WEAVER, PA-C  Your physician has recommended that you wear an event monitor 786.50, 427.31 CP AND AFIB. Event monitors are medical devices that record the heart's electrical activity. Doctors most often [place] these monitors to diagnose arrhythmias. Arrhythmias are problems with the speed or rhythm of the heartbeat. The monitor is a small, portable device. You can wear one while you do your normal daily activities. This is usually used to diagnose what is causing palpitations/syncope (passing out).  Your physician has requested that you have a lexiscan Myoview 786.50 CP. For further information please visit [email or web address]. Please follow instruction sheet, as given.   Your physician recommends that you return for lab work in: TODAY BMET, MAGNESIUM, TSH, BNP 427.31, 786.50

## 2011-03-02 NOTE — Assessment & Plan Note (Signed)
I believe she is having recurrent atrial fibrillation with rapid ventricular rate.  I will set her up for a 21 day event monitor.  We will also check a basic metabolic panel, magnesium and TSH.  She will return for follow up with Dr. Caryl Comes in the next 4-6 weeks after her stress test and monitor are complete.

## 2011-03-02 NOTE — Progress Notes (Signed)
History of Present Illness: Primary Electrophysiologist:  Dr. Virl Axe  [name] is a 70 y.o. female with a h/o parox AFib maintaining NSR on Tikosyn therapy.  She presents today with complaints of chest pain and shortness of breath occurring about one week ago.  She awoke from sleep.  She felt her heart rate was irregular.  She did take her blood pressure and it was 140s/80s with a pulse in the low 100s.  She feels that her symptoms were similar to what she's had in the past with atrial fibrillation.  However, she previously perceived seeing her heart beat through her chest.  It was a substernal pressure.  She denies radiating symptoms.  She did have associated shortness of breath.  She felt as though her shortness of breath worsened when she performed any type of activity.  She had associated nausea and diaphoresis.  She did not take any antacids but did take some mustard without relief.  Her symptoms lasted about 11 hours and then subsided on their own.  She sleeps on 4 pillows for the last several weeks (2 chronically).  She denies PND or significant weight gain.  She denies edema.  She denies syncope.  She has felt well since this episode.  She does note some dyspnea with exertion.  She describes NYHA class IIb symptoms.  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  She denies exertional chest pain.  Past Medical History  Diagnosis Date  . Unspecified glaucoma   . HYPERLIPIDEMIA   . HYPERTENSION   . AORTIC STENOSIS     a. echo 11/11: EF 55-60%, mod LVH, mild AS (mean 12), mild AI, mild MR, mild LAE;  b. cath 2/09: no CAD, EF 70%  . Atrial fibrillation   . SINUS BRADYCARDIA   . OSTEOPENIA   . RA (rheumatoid arthritis)     Current Outpatient Prescriptions  Medication Sig Dispense Refill  . aspirin 325 MG tablet Take 325 mg by mouth daily.        . B Complex Vitamins (VITAMIN B COMPLEX) TABS 1 tab po qd       . Calcium Carbonate (CALCARB 600) 1500 MG TABS Take by mouth.        .  dofetilide (TIKOSYN) 500 MCG capsule Take 500 mcg by mouth 2 (two) times daily.        Marland Kitchen KLOR-CON M20 20 MEQ tablet TAKE 2 TABLETS BY MOUTH EVERY DAY  60 tablet  5  . pindolol (VISKEN) 5 MG tablet Take 5 mg by mouth 2 (two) times daily.        . timolol (TIMOPTIC) 0.5 % ophthalmic solution 1 drop as directed.        . triamterene-hydrochlorothiazide (MAXZIDE-25) 37.5-25 MG per tablet 1/2 tab po qd       . DISCONTD: aspirin 81 MG tablet Take 81 mg by mouth daily.        Marland Kitchen DISCONTD: ergocalciferol (VITAMIN D2) 50000 UNITS capsule Take 50,000 Units by mouth once a week.          Allergies: Allergies  Allergen Reactions  . Celecoxib    History  Substance Use Topics  . Smoking status: Never Smoker   . Smokeless tobacco: Never Used  . Alcohol Use: No    ROS:  Please see the history of present illness.  She denies fevers, chills, cough, melena, hematochezia.  She did have a urinary tract infection about 2 months ago.  All other systems reviewed and negative.  Vital Signs: BP 124/77  Pulse 64  Ht 5\' 6"  (1.676 m)  Wt 185 lb (83.915 kg)  BMI 29.86 kg/m2  PHYSICAL EXAM: Well nourished, well developed, in no acute distress HEENT: normal Neck: no JVD Endocrine: no thyromegaly Cardiac:  normal S1, S2; RRR; no murmur Lungs:  clear to auscultation bilaterally, no wheezing, rhonchi or rales Abd: soft, nontender, no hepatomegaly Ext: no edema Skin: warm and dry Neuro:  CNs 2-12 intact, no focal abnormalities noted Psych: normal affect  EKG:  Sinus rhythm, heart rate 62, normal axis, nonspecific ST-T wave changes, QTC 440 ms, no significant change since previous tracing  ASSESSMENT AND PLAN:

## 2011-03-08 ENCOUNTER — Encounter (INDEPENDENT_AMBULATORY_CARE_PROVIDER_SITE_OTHER): Payer: Medicare Other

## 2011-03-08 ENCOUNTER — Ambulatory Visit (HOSPITAL_COMMUNITY): Payer: Medicare Other | Attending: Internal Medicine | Admitting: Radiology

## 2011-03-08 VITALS — Ht 66.0 in | Wt 186.0 lb

## 2011-03-08 DIAGNOSIS — R0602 Shortness of breath: Secondary | ICD-10-CM

## 2011-03-08 DIAGNOSIS — R079 Chest pain, unspecified: Secondary | ICD-10-CM

## 2011-03-08 DIAGNOSIS — I4891 Unspecified atrial fibrillation: Secondary | ICD-10-CM

## 2011-03-08 DIAGNOSIS — R0789 Other chest pain: Secondary | ICD-10-CM

## 2011-03-08 MED ORDER — TECHNETIUM TC 99M TETROFOSMIN IV KIT
11.0000 | PACK | Freq: Once | INTRAVENOUS | Status: AC | PRN
  Administered 2011-03-08: 11 via INTRAVENOUS

## 2011-03-08 MED ORDER — TECHNETIUM TC 99M TETROFOSMIN IV KIT
33.0000 | PACK | Freq: Once | INTRAVENOUS | Status: AC | PRN
  Administered 2011-03-08: 33 via INTRAVENOUS

## 2011-03-08 MED ORDER — REGADENOSON 0.4 MG/5ML IV SOLN
0.4000 mg | Freq: Once | INTRAVENOUS | Status: AC
  Administered 2011-03-08: 0.4 mg via INTRAVENOUS

## 2011-03-08 NOTE — Progress Notes (Signed)
[place] [address] [phone]  Cardiology Nuclear Med Study  ANE METHOD is a 70 y.o. female [id number] [birth year]   Nuclear Med Background Indication for Stress Test:  Evaluation for Ischemia History: 11/11 Echo:EF=55-60% with moderate LVH, mild MR and LAE and mild Aortic stenosis,09 Heart Catheterization: no  CAD and EF=70%and 09 Myocardial Infarction per patient  Cardiac Risk Factors: Hypertension and Lipids  Symptoms:  Chest Pressure.  (last date of chest discomfort 2 weeks ago), Diaphoresis, Dizziness, DOE, Fatigue, Light-Headedness, Nausea, Palpitations and Rapid HR   Nuclear Pre-Procedure Caffeine/Decaff Intake:  None NPO After: 7:00pm   Lungs:  clear IV 0.9% NS with Angio Cath:  20g  IV Site: R Antecubital  IV Started by:  Eliezer Lofts, EMT-P  Chest Size (in):  36 Cup Size: B  Height: 5\' 6"  (1.676 m)  Weight:  186 lb (84.369 kg)  BMI:  Body mass index is 30.02 kg/(m^2). Tech Comments:  Pindolol was taken 6 am this am, per patient.    Nuclear Med Study 1 or 2 day study: 1 day  Stress Test Type:  Treadmill/Lexiscan  Reading MD: Loralie Champagne, MD  Order Authorizing Provider:  Berlinda Last  Resting Radionuclide: Technetium 14m Tetrofosmin  Resting Radionuclide Dose: 11 mCi   Stress Radionuclide:  Technetium 63m Tetrofosmin  Stress Radionuclide Dose: 33 mCi           Stress Protocol Rest HR: 52 Stress HR: 83  Rest BP: 138/85 Stress BP: 153/85  Exercise Time (min): 2:00 METS: 1.9   Predicted Max HR: 152 bpm % Max HR: 54.61 bpm Rate Pressure Product: 12699   Dose of Adenosine (mg):  n/a Dose of Lexiscan: 0.4 mg  Dose of Atropine (mg): n/a Dose of Dobutamine: n/a mcg/kg/min (at max HR)  Stress Test Technologist: Matilde Haymaker, RN  Nuclear Technologist:  Charlton Amor, CNMT     Rest Procedure:  Myocardial perfusion imaging was performed at rest 45 minutes following the intravenous  administration of Technetium 28m Tetrofosmin. Rest ECG: Sinus Bradycardia  Stress Procedure:  The patient received IV Lexiscan 0.4 mg over 15-seconds with concurrent low level exercise and then Technetium 37m Tetrofosmin was injected at 30-seconds while the patient continued walking one more minute. Patient had occasional PVC. There were no significant changes with Lexiscan.  Quantitative spect images were obtained after a 45-minute delay. Stress ECG: No significant change from baseline ECG  QPS Raw Data Images:  Normal; no motion artifact; normal heart/lung ratio. Stress Images:  Normal homogeneous uptake in all areas of the myocardium. Rest Images:  Normal homogeneous uptake in all areas of the myocardium. Subtraction (SDS):  There is no evidence of scar or ischemia. Transient Ischemic Dilatation (Normal <1.22):  1.02 Lung/Heart Ratio (Normal <0.45):  .35  Quantitative Gated Spect Images QGS EDV:  77 ml QGS ESV:  23 ml QGS cine images:  Normal Wall Motion QGS EF: 70%  Impression Exercise Capacity:  Lexiscan with low level exercise.  BP Response:  Normal blood pressure response. Clinical Symptoms:  Short of breath.  ECG Impression:  No significant ST segment change suggestive of ischemia. Comparison with Prior Nuclear Study: No images to compare  Overall Impression:  Normal stress nuclear study.  Willona Phariss [employer]

## 2011-03-09 NOTE — Progress Notes (Signed)
COPY ROUTED TO DR. KLEIN.Baxter Flattery

## 2011-03-11 ENCOUNTER — Other Ambulatory Visit: Payer: Self-pay | Admitting: Internal Medicine

## 2011-03-18 ENCOUNTER — Other Ambulatory Visit: Payer: Self-pay

## 2011-03-20 NOTE — Progress Notes (Signed)
Please Inform Patient  Thanks

## 2011-03-29 NOTE — Progress Notes (Signed)
Left message for pt of normal results and to call back if further questions

## 2011-04-10 ENCOUNTER — Telehealth: Payer: Self-pay | Admitting: Internal Medicine

## 2011-04-11 ENCOUNTER — Ambulatory Visit: Payer: Medicare Other | Admitting: Internal Medicine

## 2011-04-15 ENCOUNTER — Telehealth: Payer: Self-pay | Admitting: *Deleted

## 2011-04-15 NOTE — Telephone Encounter (Signed)
[facility] regarding event results.

## 2011-04-18 NOTE — Telephone Encounter (Signed)
LMTC

## 2011-04-19 NOTE — Telephone Encounter (Signed)
LMTC

## 2011-04-19 NOTE — Telephone Encounter (Signed)
Pt is returning your call

## 2011-04-19 NOTE — Telephone Encounter (Signed)
I spoke with the patient about her monitor results.

## 2011-04-22 ENCOUNTER — Other Ambulatory Visit: Payer: Self-pay | Admitting: *Deleted

## 2011-04-22 MED ORDER — DOFETILIDE 500 MCG PO CAPS: 500.0000 ug | ORAL_CAPSULE | Freq: Two times a day (BID) | ORAL | Status: DC

## 2011-04-22 NOTE — Telephone Encounter (Signed)
tikosyn 500 mg. [facility] in [place] [id number].

## 2011-05-05 ENCOUNTER — Encounter: Payer: Self-pay | Admitting: Internal Medicine

## 2011-05-11 ENCOUNTER — Encounter: Payer: Self-pay | Admitting: Internal Medicine

## 2011-05-11 ENCOUNTER — Ambulatory Visit (INDEPENDENT_AMBULATORY_CARE_PROVIDER_SITE_OTHER): Payer: Medicare Other | Admitting: Internal Medicine

## 2011-05-11 DIAGNOSIS — R079 Chest pain, unspecified: Secondary | ICD-10-CM

## 2011-05-11 DIAGNOSIS — I4891 Unspecified atrial fibrillation: Secondary | ICD-10-CM

## 2011-05-11 DIAGNOSIS — I495 Sick sinus syndrome: Secondary | ICD-10-CM

## 2011-05-11 DIAGNOSIS — I359 Nonrheumatic aortic valve disorder, unspecified: Secondary | ICD-10-CM

## 2011-05-11 NOTE — Assessment & Plan Note (Signed)
Stable

## 2011-05-11 NOTE — Assessment & Plan Note (Signed)
Very few symptoms. The event recorder demonstrated no intercurrent atrial fibrillation and will continue her on Tikosyn. Her surveillance laboratories were normal last month.  Her CHADS VASC score is 2. It would be appropriate to put her on oral anticoagulation. With her reflux like symptoms, we will hold on Pradaxa and await the release of apixoban in the fall

## 2011-05-11 NOTE — Assessment & Plan Note (Signed)
Stable. She'll need an echo next year.

## 2011-05-11 NOTE — Assessment & Plan Note (Signed)
This seemed to be the primary component of her symptom complex for which he saw Richardson Dopp. The Myoview scan was negative. The onset of these symptoms are primarily in the morning upon awakening. I suspect that this represents reflux. We will ask her to take an over-the-counter PPI. She is scheduled to see her PCP in early September

## 2011-05-11 NOTE — Patient Instructions (Signed)
Your physician has recommended you make the following change in your medication:  1) Decrease Aspirin to 81mg  once daily. 2) Start Omeprazole OTC as directed on the box.  Your physician recommends that you schedule a follow-up appointment in: 4 months.

## 2011-05-11 NOTE — Progress Notes (Signed)
History of Present Illness:   [name] is a 70 y.o. 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   She saw Richardson Dopp in June because of recurrent palpitations. She is given an event monitor to try to elucidate the cause of her symptoms.  Echo 2011 demonstrated normal left ventricular function and mild aortic stenosis   Past Medical History  Diagnosis Date  . Unspecified glaucoma   . HYPERLIPIDEMIA   . HYPERTENSION   . AORTIC STENOSIS     a. echo 11/11: EF 55-60%, mod LVH, mild AS (mean 12), mild AI, mild MR, mild LAE;  b. cath 2/09: no CAD, EF 70%  . Atrial fibrillation   . SINUS BRADYCARDIA   . OSTEOPENIA   . RA (rheumatoid arthritis)   . Pneumonia 2009  . Anemia     Current Outpatient Prescriptions  Medication Sig Dispense Refill  . aspirin 325 MG tablet Take 325 mg by mouth daily.        . B Complex Vitamins (VITAMIN B COMPLEX) TABS 1 tab po qd       . Calcium Carbonate (CALCARB 600) 1500 MG TABS Take by mouth.        . dofetilide (TIKOSYN) 500 MCG capsule Take 1 capsule (500 mcg total) by mouth 2 (two) times daily.  180 capsule  1  . KLOR-CON M20 20 MEQ tablet TAKE 2 TABLETS BY MOUTH EVERY DAY  60 tablet  5  . pindolol (VISKEN) 5 MG tablet TAKE 1 TAB TWO TIMES A DAY  60 tablet  2  . timolol (TIMOPTIC) 0.5 % ophthalmic solution 1 drop as directed.        . triamterene-hydrochlorothiazide (MAXZIDE-25) 37.5-25 MG per tablet TAKE 1/2 TABLET BY MOUTH ONCE DAILY  15 tablet  5    Allergies: Allergies  Allergen Reactions  . Celebrex (Celecoxib)    History  Substance Use Topics  . Smoking status: Never Smoker   . Smokeless tobacco: Never Used  . Alcohol Use: No    ROS:  Please see the history of present illness.  She denies fevers, chills, cough, melena, hematochezia.  She did have a urinary tract infection about 2 months ago.  All other systems reviewed and negative.  Vital Signs: Ht 5' 5.5" (1.664 m)  Wt 185 lb 6.4 oz  (84.097 kg)  BMI 30.38 kg/m2  PHYSICAL EXAM: Well nourished, well developed, in no acute distress HEENT: normal Neck: no JVD Endocrine: no thyromegaly Cardiac:  normal S1, S2; RRR; no murmur Lungs:  clear to auscultation bilaterally, no wheezing, rhonchi or rales Abd: soft, nontender, no hepatomegaly Ext: no edema Skin: warm and dry Neuro:  CNs 2-12 intact, no focal abnormalities noted Psych: normal affect  EKG:  Sinus rhythm, heart rate 62, normal axis, nonspecific ST-T wave changes, QTC 440 ms, no significant change since previous tracing  ASSESSMENT AND PLAN:

## 2011-06-08 ENCOUNTER — Other Ambulatory Visit: Payer: Self-pay | Admitting: *Deleted

## 2011-06-08 MED ORDER — PINDOLOL 5 MG PO TABS: 5.0000 mg | ORAL_TABLET | Freq: Two times a day (BID) | ORAL | Status: DC

## 2011-06-16 ENCOUNTER — Telehealth: Payer: Self-pay | Admitting: Family Medicine

## 2011-06-16 ENCOUNTER — Other Ambulatory Visit (INDEPENDENT_AMBULATORY_CARE_PROVIDER_SITE_OTHER): Payer: Medicare Other

## 2011-06-16 DIAGNOSIS — I10 Essential (primary) hypertension: Secondary | ICD-10-CM

## 2011-06-16 DIAGNOSIS — D509 Iron deficiency anemia, unspecified: Secondary | ICD-10-CM

## 2011-06-16 DIAGNOSIS — E876 Hypokalemia: Secondary | ICD-10-CM

## 2011-06-16 DIAGNOSIS — E782 Mixed hyperlipidemia: Secondary | ICD-10-CM

## 2011-06-16 LAB — CBC WITH DIFFERENTIAL/PLATELET
Eosinophils Relative: 4 % (ref 0.0–5.0)
MCV: 91.1 fl (ref 78.0–100.0)
Monocytes Absolute: 0.4 10*3/uL (ref 0.1–1.0)
Neutrophils Relative %: 46.4 % (ref 43.0–77.0)
Platelets: 172 10*3/uL (ref 150.0–400.0)
WBC: 5 10*3/uL (ref 4.5–10.5)

## 2011-06-16 LAB — LIPID PANEL
HDL: 68.1 mg/dL (ref >39.00)
LDL Cholesterol: 120 mg/dL — ABNORMAL HIGH (ref 0–99)
Total CHOL/HDL Ratio: 3
VLDL: 12 mg/dL (ref 0.0–40.0)

## 2011-06-16 LAB — COMPREHENSIVE METABOLIC PANEL
ALT: 14 U/L (ref 0–35)
AST: 20 U/L (ref 0–37)
Alkaline Phosphatase: 53 U/L (ref 39–117)
Creatinine, Ser: 1.4 mg/dL — ABNORMAL HIGH (ref 0.4–1.2)
GFR: 46.42 mL/min — ABNORMAL LOW (ref >60.00)
Total Bilirubin: 0.8 mg/dL (ref 0.3–1.2)

## 2011-06-16 NOTE — Telephone Encounter (Signed)
Message copied by [name] on Thu Jun 16, 2011  8:24 AM ------      Message from: Ellamae Sia      Created: Fri Jun 10, 2011  3:50 PM       Patient is scheduled for CPX labs,Thursday,  please order future labs, Thanks , Karna Christmas

## 2011-06-16 NOTE — Telephone Encounter (Signed)
Message copied by [name] on Thu Jun 16, 2011  8:25 AM ------      Message from: Ellamae Sia      Created: Fri Jun 10, 2011  3:50 PM       Patient is scheduled for CPX labs,Thursday,  please order future labs, Thanks , Karna Christmas

## 2011-06-23 ENCOUNTER — Encounter: Payer: Self-pay | Admitting: Family Medicine

## 2011-06-23 ENCOUNTER — Ambulatory Visit (INDEPENDENT_AMBULATORY_CARE_PROVIDER_SITE_OTHER): Payer: Medicare Other | Admitting: Family Medicine

## 2011-06-23 DIAGNOSIS — I10 Essential (primary) hypertension: Secondary | ICD-10-CM

## 2011-06-23 DIAGNOSIS — E782 Mixed hyperlipidemia: Secondary | ICD-10-CM

## 2011-06-23 DIAGNOSIS — Z0000 Encounter for general adult medical examination without abnormal findings: Secondary | ICD-10-CM

## 2011-06-23 DIAGNOSIS — I4891 Unspecified atrial fibrillation: Secondary | ICD-10-CM

## 2011-06-23 DIAGNOSIS — M899 Disorder of bone, unspecified: Secondary | ICD-10-CM

## 2011-06-23 DIAGNOSIS — Z1231 Encounter for screening mammogram for malignant neoplasm of breast: Secondary | ICD-10-CM

## 2011-06-23 DIAGNOSIS — M949 Disorder of cartilage, unspecified: Secondary | ICD-10-CM

## 2011-06-23 NOTE — Progress Notes (Signed)
Subjective:    Patient ID: [name], female    DOB: 1941/08/10, 70 y.o.   MRN: [id number]  HPI I have personally reviewed the Medicare Annual Wellness questionnaire and have noted 1. The patient's medical and social history 2. Their use of alcohol, tobacco or illicit drugs 3. Their current medications and supplements 4. The patient's functional ability including ADL's, fall risks, home safety risks and hearing or visual             impairment. 5. Diet and physical activities 6. Evidence for depression or mood disorders The patients weight, height, BMI and visual acuity have been recorded in the chart I have made referrals, counseling and provided education to the patient based review of the above and I have provided the pt with a written personalized care plan for preventive services.  See the attached quetionairre.  Hypertension:   Well controlled Using medication without problems or lightheadedness:  Chest pain with exertion: none Edema:None Short of breath:None Average home BPs:120/70s Other issues:  Elevated Cholesterol: Improved from 142 to 120. Goal <130. Diet control, no medication. Other complaints: She has been staying more busy, no exercsie, watching what she eats.  Sees Dr. Kathlen Mody and Dr. Caryl Comes. Parox AFib maintaining NSR on Tikosyn therapy,  sinus bradycardia, aortic stenosis She saw Richardson Dopp in June because of recurrent palpitations. She is given an event monitor to try to elucidate the cause of her symptoms.  Echo 2011 demonstrated normal left ventricular function and mild aortic stenosis  Felt recent chest pain due to GERD.. Started on prilosec.Marland Kitchen Resolved symptoms completely , not on now.  ? RA, stable off prednisone... not seeing rheumatologist.She was told only to return if having issues.        Review of Systems  Constitutional: Positive for fatigue. Negative for fever.       Has recently moved.  HENT: Negative for ear pain.   Eyes:  Negative for pain.  Respiratory: Negative for chest tightness and shortness of breath.   Cardiovascular: Negative for chest pain, palpitations and leg swelling.  Gastrointestinal: Negative for abdominal pain.  Genitourinary: Negative for dysuria.  Skin: Negative for rash.  Psychiatric/Behavioral: Negative for dysphoric mood. The patient is not nervous/anxious.        Objective:   Physical Exam  Constitutional: Vital signs are normal. She appears well-developed and well-nourished. She is cooperative.  Non-toxic appearance. She does not appear ill. No distress.  HENT:  Head: Normocephalic.  Right Ear: Hearing, tympanic membrane, external ear and ear canal normal.  Left Ear: Hearing, tympanic membrane, external ear and ear canal normal.  Nose: Nose normal.  Eyes: Conjunctivae, EOM and lids are normal. Pupils are equal, round, and reactive to light. No foreign bodies found.  Neck: Trachea normal and normal range of motion. Neck supple. Carotid bruit is not present. No mass and no thyromegaly present.  Cardiovascular: Normal rate, regular rhythm, S1 normal, S2 normal and intact distal pulses.  Exam reveals no gallop.   Murmur heard.  Systolic murmur is present with a grade of 2/6       No peripheral swelling  B varicosisties in legs.  Pulmonary/Chest: Effort normal and breath sounds normal. No respiratory distress. She has no wheezes. She has no rhonchi. She has no rales.  Abdominal: Soft. Normal appearance and bowel sounds are normal. She exhibits no distension, no fluid wave, no abdominal bruit and no mass. There is no hepatosplenomegaly. There is no tenderness. There is no rebound, no guarding  and no CVA tenderness. No hernia.  Genitourinary: No breast swelling, tenderness, discharge or bleeding. Pelvic exam was performed with patient prone.       Pt deferred DVE, no pap indicated.  Lymphadenopathy:    She has no cervical adenopathy.    She has no axillary adenopathy.  Neurological: She  is alert. She has normal strength. No cranial nerve deficit or sensory deficit.  Skin: Skin is warm, dry and intact. No rash noted.  Psychiatric: Her speech is normal and behavior is normal. Judgment normal. Her mood appears not anxious. Cognition and memory are normal. She does not exhibit a depressed mood.          Assessment & Plan:  Annual Medicare Exam: The patient's preventative maintenance and recommended screening tests for an annual wellness exam were reviewed in full today. Brought up to date unless services declined.  Counselled on the importance of diet, exercise, and its role in overall health and mortality. The patient's FH and SH was reviewed, including their home life, tobacco status, and drug and alcohol status.   Osteopenia: last DEXA 2011, stable Colonoscopy: Partial Hysterectomy: no pap indicated, DVE, pt not interested... No family or personal history of ovarian cancer, asymptomatic. Vaccines: Uptodate, consider zostavax.  Mammogram: to be scheduled.

## 2011-06-23 NOTE — Patient Instructions (Addendum)
Stop at front desk to schedule mammogram with Rosaria Ferries. Consider flu vaccine and shingles vaccine.. Call insurance regarding coverage of shingles vaccine.

## 2011-07-01 LAB — COMPREHENSIVE METABOLIC PANEL
BUN: 13
CO2: 22
Calcium: 9.4
Chloride: 107
Creatinine, Ser: 0.97
GFR calc non Af Amer: 58 — ABNORMAL LOW
Glucose, Bld: 96
Total Bilirubin: 0.9

## 2011-07-01 LAB — CBC
HCT: 28.2 — ABNORMAL LOW
HCT: 30.2 — ABNORMAL LOW
HCT: 30.4 — ABNORMAL LOW
HCT: 31.1 — ABNORMAL LOW
Hemoglobin: 10.2 — ABNORMAL LOW
Hemoglobin: 10.3 — ABNORMAL LOW
Hemoglobin: 11.1 — ABNORMAL LOW
MCHC: 33.9
MCV: 90.2
MCV: 90.8
Platelets: 168
Platelets: 185
Platelets: 190
RBC: 3.11 — ABNORMAL LOW
RBC: 3.32 — ABNORMAL LOW
RBC: 4.11
RDW: 13.6
RDW: 13.7
RDW: 13.8
WBC: 7.2
WBC: 7.4
WBC: 7.7

## 2011-07-01 LAB — BASIC METABOLIC PANEL
BUN: 12
CO2: 25
Chloride: 111
GFR calc non Af Amer: 48 — ABNORMAL LOW
Glucose, Bld: 100 — ABNORMAL HIGH
Potassium: 3.5
Potassium: 3.6
Sodium: 140

## 2011-07-01 LAB — TSH: TSH: 0.899

## 2011-07-01 LAB — PROTIME-INR
INR: 0.9
Prothrombin Time: 12.3

## 2011-07-01 LAB — I-STAT 8, (EC8 V) (CONVERTED LAB)
BUN: 15
Chloride: 111
HCT: 41
Hemoglobin: 13.9
Operator id: 295021
Sodium: 140
pCO2, Ven: 30.1 — ABNORMAL LOW

## 2011-07-01 LAB — HEMOGLOBIN A1C
Hgb A1c MFr Bld: 5.9
Mean Plasma Glucose: 133

## 2011-07-01 LAB — LIPID PANEL
HDL: 75
Total CHOL/HDL Ratio: 2.7
Triglycerides: 25

## 2011-07-01 LAB — POCT CARDIAC MARKERS
CKMB, poc: 1.2
Operator id: 295021
Troponin i, poc: <0.05

## 2011-07-01 LAB — CARDIAC PANEL(CRET KIN+CKTOT+MB+TROPI)
CK, MB: 4.7 — ABNORMAL HIGH
Troponin I: 0.36 — ABNORMAL HIGH

## 2011-07-01 LAB — CK TOTAL AND CKMB (NOT AT ARMC): Relative Index: INVALID

## 2011-07-01 LAB — HEPARIN LEVEL (UNFRACTIONATED): Heparin Unfractionated: 1.22 — ABNORMAL HIGH

## 2011-07-01 LAB — POCT I-STAT CREATININE
Creatinine, Ser: 1.2
Operator id: 295021

## 2011-07-04 LAB — MAGNESIUM: Magnesium: 2

## 2011-07-04 LAB — DIFFERENTIAL
Basophils Absolute: 0.1
Basophils Absolute: 0.1
Basophils Relative: 0
Basophils Relative: 1
Basophils Relative: 1
Eosinophils Absolute: 0.1
Eosinophils Absolute: 0.4
Eosinophils Absolute: 0.4
Eosinophils Absolute: 0.2
Eosinophils Relative: 4
Lymphs Abs: 2.4
Monocytes Absolute: 0.7
Monocytes Absolute: 1.6 — ABNORMAL HIGH
Monocytes Relative: 16 — ABNORMAL HIGH
Monocytes Relative: 7
Monocytes Relative: 9
Monocytes Relative: 6
Neutro Abs: 5.7
Neutrophils Relative %: 65
Neutrophils Relative %: 64

## 2011-07-04 LAB — PROTIME-INR: INR: 1

## 2011-07-04 LAB — BASIC METABOLIC PANEL
BUN: 10
BUN: 12
BUN: 14
BUN: 14
BUN: 16
BUN: 17
BUN: 12
CO2: 22
CO2: 24
Calcium: 9.1
Calcium: 9.1
Calcium: 9.3
Calcium: 9.3
Calcium: 9.5
Calcium: 9.6
Chloride: 106
Chloride: 110
Chloride: 113 — ABNORMAL HIGH
Creatinine, Ser: 1.16
Creatinine, Ser: 1.22 — ABNORMAL HIGH
Creatinine, Ser: 1.22 — ABNORMAL HIGH
Creatinine, Ser: 1.28 — ABNORMAL HIGH
Creatinine, Ser: 1.08
Creatinine, Ser: 1.35 — ABNORMAL HIGH
GFR calc Af Amer: 54 — ABNORMAL LOW
GFR calc Af Amer: 54 — ABNORMAL LOW
GFR calc Af Amer: >60
GFR calc non Af Amer: 47 — ABNORMAL LOW
GFR calc non Af Amer: 50 — ABNORMAL LOW
GFR calc non Af Amer: 53 — ABNORMAL LOW
GFR calc non Af Amer: 39 — ABNORMAL LOW
Glucose, Bld: 121 — ABNORMAL HIGH
Glucose, Bld: 88
Glucose, Bld: 94
Glucose, Bld: 95
Glucose, Bld: 103 — ABNORMAL HIGH
Potassium: 3.9
Potassium: 4.2
Potassium: 4.3
Sodium: 138
Sodium: 140
Sodium: 142

## 2011-07-04 LAB — URINALYSIS, ROUTINE W REFLEX MICROSCOPIC
Glucose, UA: NEGATIVE
Hgb urine dipstick: NEGATIVE
Ketones, ur: NEGATIVE
Protein, ur: NEGATIVE
pH: 5.5

## 2011-07-04 LAB — I-STAT 8, (EC8 V) (CONVERTED LAB)
Acid-Base Excess: 1
BUN: 15
Chloride: 110
HCT: 32 — ABNORMAL LOW
Hemoglobin: 11.6 — ABNORMAL LOW
Operator id: 222501
Potassium: 3.7
Sodium: 144
TCO2: 26
pCO2, Ven: 30.9 — ABNORMAL LOW
pH, Ven: 7.501 — ABNORMAL HIGH

## 2011-07-04 LAB — CBC
HCT: 30.9 — ABNORMAL LOW
HCT: 32.1 — ABNORMAL LOW
Hemoglobin: 10.5 — ABNORMAL LOW
Hemoglobin: 10.6 — ABNORMAL LOW
Hemoglobin: 11.3 — ABNORMAL LOW
Hemoglobin: 10.6 — ABNORMAL LOW
MCHC: 34.1
MCV: 91
MCV: 91
MCV: 92.1
MCV: 91.3
Platelets: 195
Platelets: 200
Platelets: 208
Platelets: 219
Platelets: 255
RBC: 3.46 — ABNORMAL LOW
RDW: 14
RDW: 14.1
RDW: 14.3
RDW: 14.5
WBC: 11.7 — ABNORMAL HIGH
WBC: 8.1
WBC: 9.6
WBC: 8.9

## 2011-07-04 LAB — CK TOTAL AND CKMB (NOT AT ARMC)
CK, MB: 1.4
CK, MB: 1.1
Relative Index: 2.1
Relative Index: INVALID
Relative Index: INVALID
Relative Index: INVALID
Total CK: 55
Total CK: 51

## 2011-07-04 LAB — COMPREHENSIVE METABOLIC PANEL
ALT: 29
ALT: 58 — ABNORMAL HIGH
Albumin: 3 — ABNORMAL LOW
Albumin: 3.8
Alkaline Phosphatase: 50
Alkaline Phosphatase: 93
Chloride: 108
GFR calc Af Amer: 58 — ABNORMAL LOW
Potassium: 3.9
Potassium: 4
Sodium: 138
Sodium: 142
Total Bilirubin: 0.6
Total Protein: 7
Total Protein: 7.6

## 2011-07-04 LAB — LACTATE DEHYDROGENASE, PLEURAL OR PERITONEAL FLUID: LD, Fluid: 162 — ABNORMAL HIGH

## 2011-07-04 LAB — BODY FLUID CELL COUNT WITH DIFFERENTIAL
Eos, Fluid: 1
Lymphs, Fluid: 46
Monocyte-Macrophage-Serous Fluid: 40 — ABNORMAL LOW
Other Cells, Fluid: 1

## 2011-07-04 LAB — BODY FLUID CULTURE: Culture: NO GROWTH

## 2011-07-04 LAB — POCT CARDIAC MARKERS
Myoglobin, poc: 69.6
Operator id: 222501

## 2011-07-04 LAB — RETICULOCYTES
RBC.: 3.47 — ABNORMAL LOW
Retic Ct Pct: 1.3

## 2011-07-04 LAB — FERRITIN
Ferritin: 156 (ref 10–291)
Ferritin: 249 (ref 10–291)

## 2011-07-04 LAB — TROPONIN I
Troponin I: 0.02
Troponin I: 0.02

## 2011-07-04 LAB — CARDIAC PANEL(CRET KIN+CKTOT+MB+TROPI)
Relative Index: INVALID
Relative Index: INVALID
Troponin I: 0.02

## 2011-07-04 LAB — IRON AND TIBC
Saturation Ratios: 4 — ABNORMAL LOW
UIBC: 249

## 2011-07-04 LAB — PROTEIN, BODY FLUID

## 2011-07-04 LAB — FOLATE: Folate: >20

## 2011-07-04 LAB — VITAMIN B12: Vitamin B-12: 695 (ref 211–911)

## 2011-07-04 LAB — B-NATRIURETIC PEPTIDE (CONVERTED LAB): Pro B Natriuretic peptide (BNP): 125 — ABNORMAL HIGH

## 2011-07-04 LAB — APTT: aPTT: 28

## 2011-07-24 ENCOUNTER — Other Ambulatory Visit: Payer: Self-pay | Admitting: Family Medicine

## 2011-08-23 ENCOUNTER — Ambulatory Visit: Payer: Self-pay | Admitting: Family Medicine

## 2011-08-25 ENCOUNTER — Encounter: Payer: Self-pay | Admitting: Family Medicine

## 2011-08-26 ENCOUNTER — Encounter: Payer: Self-pay | Admitting: *Deleted

## 2011-09-20 ENCOUNTER — Encounter: Payer: Self-pay | Admitting: Internal Medicine

## 2011-09-20 ENCOUNTER — Ambulatory Visit (INDEPENDENT_AMBULATORY_CARE_PROVIDER_SITE_OTHER): Payer: Medicare Other | Admitting: Internal Medicine

## 2011-09-20 DIAGNOSIS — I495 Sick sinus syndrome: Secondary | ICD-10-CM

## 2011-09-20 DIAGNOSIS — I359 Nonrheumatic aortic valve disorder, unspecified: Secondary | ICD-10-CM

## 2011-09-20 DIAGNOSIS — I4891 Unspecified atrial fibrillation: Secondary | ICD-10-CM

## 2011-09-20 DIAGNOSIS — I10 Essential (primary) hypertension: Secondary | ICD-10-CM

## 2011-09-20 MED ORDER — SPIRONOLACTONE 25 MG PO TABS: ORAL_TABLET | ORAL | Status: DC

## 2011-09-20 NOTE — Progress Notes (Signed)
  HPI  [name] is a 70 y.o. female seen in followup for parox AFib maintaining NSR on Tikosyn therapy.  She saw Richardson Dopp in June because of recurrent palpitations. Present recorded at that time failed to identify arrhythmia Echo 2011 demonstrated normal left ventricular function and mild aortic stenosis   Past Medical History  Diagnosis Date  . Unspecified glaucoma   . HYPERLIPIDEMIA   . HYPERTENSION   . AORTIC STENOSIS     a. echo 11/11: EF 55-60%, mod LVH, mild AS (mean 12), mild AI, mild MR, mild LAE;  b. cath 2/09: no CAD, EF 70%  . Atrial fibrillation     On Tikosyn  . SINUS BRADYCARDIA   . OSTEOPENIA   . RA (rheumatoid arthritis)   . Pneumonia 2009  . Anemia   . Chest pain     Past Surgical History  Procedure Date  . Partial hysterectomy--1979   . Cardiac catheterization   . Thoracentesis   12/20/07   . Colonoscopy 04/18/08    Current Outpatient Prescriptions  Medication Sig Dispense Refill  . aspirin EC 81 MG tablet Take 1 tablet (81 mg total) by mouth daily.  150 tablet  2  . B Complex Vitamins (VITAMIN B COMPLEX) TABS 1 tab po qd       . Calcium Carbonate (CALCARB 600) 1500 MG TABS Take by mouth.        . dofetilide (TIKOSYN) 500 MCG capsule Take 1 capsule (500 mcg total) by mouth 2 (two) times daily.  180 capsule  1  . KLOR-CON M20 20 MEQ tablet TAKE 2 TABLETS BY MOUTH EVERY DAY  60 tablet  5  . pindolol (VISKEN) 5 MG tablet Take 1 tablet (5 mg total) by mouth 2 (two) times daily.  60 tablet  6  . timolol (TIMOPTIC) 0.5 % ophthalmic solution 1 drop as directed.        . triamterene-hydrochlorothiazide (MAXZIDE-25) 37.5-25 MG per tablet TAKE 1/2 TABLET BY MOUTH ONCE DAILY  15 tablet  5    Allergies  Allergen Reactions  . Celebrex (Celecoxib)     Review of Systems negative except from HPI and PMH  Physical Exam Well developed and well nourished in no acute distress HENT normal E scleral and icterus clear Neck Supple JVP flat; carotids brisk  and full Clear to ausculation Regular rate and rhythm, 2/6 murmur no  gallops or rub Soft with active bowel sounds No clubbing cyanosis none Edema Alert and oriented, grossly normal motor and sensory function Skin Warm and Dry  ECG demonstrated sinus rhythm at 57 Intervals 0.15/0.08/0.40 Axis is 30 Nonspecific ST T changes  Assessment and  Plan

## 2011-09-20 NOTE — Patient Instructions (Addendum)
Your physician has recommended you make the following change in your medication:  1) Stop Maxide 2) Start Aldactone 25mg  1/2 tablet by mouth once daily.  Your physician wants you to follow-up in: 6 months. You will receive a reminder letter in the mail two months in advance. If you don't receive a letter, please call our office to schedule the follow-up appointment.

## 2011-09-20 NOTE — Assessment & Plan Note (Signed)
Aortic stenosis was mild by echo 2011; will follow

## 2011-09-20 NOTE — Assessment & Plan Note (Signed)
Recently well controlled

## 2011-09-20 NOTE — Assessment & Plan Note (Signed)
She is holding sinus rhythm on Tikosyn. We'll need to check her potassium and magnesium levels today. Is also noted to be on Maxzide as a potassium sparing diuretic. This will be discontinued. She'll begun on Aldactone 12.5 mg daily.

## 2011-09-20 NOTE — Assessment & Plan Note (Signed)
As above.

## 2011-09-20 NOTE — Assessment & Plan Note (Signed)
Stable on her isa beta blocker

## 2011-10-07 ENCOUNTER — Other Ambulatory Visit: Payer: Self-pay | Admitting: Internal Medicine

## 2011-11-09 ENCOUNTER — Other Ambulatory Visit: Payer: Self-pay | Admitting: Internal Medicine

## 2011-12-30 ENCOUNTER — Telehealth: Payer: Self-pay | Admitting: Internal Medicine

## 2011-12-30 NOTE — Telephone Encounter (Signed)
Faxed EKG and ECHO to [facility] @ [phone] [facility]

## 2012-01-03 ENCOUNTER — Other Ambulatory Visit: Payer: Self-pay | Admitting: *Deleted

## 2012-01-03 MED ORDER — PINDOLOL 5 MG PO TABS: 5.0000 mg | ORAL_TABLET | Freq: Two times a day (BID) | ORAL | Status: DC

## 2012-02-10 ENCOUNTER — Encounter: Payer: Self-pay | Admitting: Family Medicine

## 2012-02-10 ENCOUNTER — Ambulatory Visit (INDEPENDENT_AMBULATORY_CARE_PROVIDER_SITE_OTHER): Payer: Medicare Other | Admitting: Family Medicine

## 2012-02-10 VITALS — BP 130/70 | HR 60 | Temp 98.7°F | Ht 65.0 in | Wt 175.8 lb

## 2012-02-10 DIAGNOSIS — L0390 Cellulitis, unspecified: Secondary | ICD-10-CM | POA: Insufficient documentation

## 2012-02-10 DIAGNOSIS — L0291 Cutaneous abscess, unspecified: Secondary | ICD-10-CM

## 2012-02-10 DIAGNOSIS — L989 Disorder of the skin and subcutaneous tissue, unspecified: Secondary | ICD-10-CM

## 2012-02-10 MED ORDER — CEPHALEXIN 500 MG PO CAPS: 500.0000 mg | ORAL_CAPSULE | Freq: Three times a day (TID) | ORAL | Status: AC

## 2012-02-10 NOTE — Assessment & Plan Note (Signed)
Possible skin infection in lesion.. Treat with keflex x 7 days. Call if not improving.

## 2012-02-10 NOTE — Progress Notes (Signed)
  Subjective:    Patient ID: [name], female    DOB: 1941-02-27, 71 y.o.   MRN: [id number]  HPI 71 year old female presents with recurrence of skin lesion on left inner foot. Had been taken off by Derm in 2009, then by [place] here in 2011.  Path showed eccrine poroma.. No atypia.  Gradually returned in last few months, this time more firm and , with black spots. No pain, except when rubbing.    Recently had ablation done for aortic stenosis, now on pradaxa.   Review of Systems  Constitutional: Negative for fever and fatigue.  HENT: Negative for ear pain.   Eyes: Negative for pain.  Respiratory: Negative for cough and shortness of breath.   Cardiovascular: Negative for chest pain and leg swelling.  Gastrointestinal: Negative for abdominal pain.       Objective:   Physical Exam  Constitutional: She is oriented to person, place, and time. She appears well-developed and well-nourished.  Cardiovascular: Normal rate and regular rhythm.   Pulmonary/Chest: Effort normal and breath sounds normal.  Abdominal: Soft.  Neurological: She is alert and oriented to person, place, and time.  Skin: Skin is warm and dry.       Large pea size protruding nodule , firm left innner foot on arch. Disolored with dark red and black areas, no surrounding eryhtema.  Pus discharge noted upon pressing.          Assessment & Plan:

## 2012-02-10 NOTE — Patient Instructions (Signed)
Stop at front for Derm referral.  Take Keflex 7 days, call if redness spreading, warmth increasing in area or fever.  We will call with culture results.

## 2012-02-10 NOTE — Assessment & Plan Note (Signed)
Recurrrent benign skin lesion.. Refer To derm for possible larger excision  to prevent recurrence.

## 2012-02-13 ENCOUNTER — Telehealth: Payer: Self-pay | Admitting: Family Medicine

## 2012-02-13 ENCOUNTER — Other Ambulatory Visit: Payer: Self-pay | Admitting: *Deleted

## 2012-02-13 LAB — WOUND CULTURE

## 2012-02-13 MED ORDER — DOXYCYCLINE HYCLATE 100 MG PO TABS: 100.0000 mg | ORAL_TABLET | Freq: Two times a day (BID) | ORAL | Status: AC

## 2012-02-13 MED ORDER — POTASSIUM CHLORIDE CRYS ER 20 MEQ PO TBCR: 20.0000 meq | EXTENDED_RELEASE_TABLET | Freq: Two times a day (BID) | ORAL | Status: DC

## 2012-02-13 NOTE — Telephone Encounter (Signed)
Received faxed refill request from pharmacy. Refill sent to pharmacy electronically. 

## 2012-02-13 NOTE — Telephone Encounter (Signed)
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 Stop keflex... Change to doxycycline x 10 days. Sent rx in to her pharmacy.

## 2012-02-14 NOTE — Telephone Encounter (Signed)
Patient advised.

## 2012-02-15 ENCOUNTER — Telehealth: Payer: Self-pay

## 2012-02-15 NOTE — Telephone Encounter (Signed)
Keep area covered. Complete antibiotic course. Consider contagious until med completed. Given she may have colonization on her skin from bacteria: Wash all clothes in hot water and personal items (razor,etc.) in hot water/bleach if able. Use antibacterial soap like dial or lever 2000 as daily skin cleansant.

## 2012-02-15 NOTE — Telephone Encounter (Signed)
Pt had questions about skin lesion; pt wanted to know if skin lesion with MRSA is contagious and I explained it is and needs to wear gloves with direct contact with skin lesion and use good handwashing technique. Pt wants to know how long will lesion be contagious and should it be covered at all times. Pt can be reached at [phone].

## 2012-02-16 NOTE — Telephone Encounter (Signed)
Patient advised.

## 2012-03-02 ENCOUNTER — Other Ambulatory Visit: Payer: Self-pay | Admitting: Dermatology

## 2012-05-01 ENCOUNTER — Encounter: Payer: Self-pay | Admitting: Family Medicine

## 2012-05-01 ENCOUNTER — Ambulatory Visit (INDEPENDENT_AMBULATORY_CARE_PROVIDER_SITE_OTHER): Payer: Medicare Other | Admitting: Family Medicine

## 2012-05-01 VITALS — BP 122/80 | HR 64 | Temp 98.0°F | Wt 172.0 lb

## 2012-05-01 DIAGNOSIS — R1032 Left lower quadrant pain: Secondary | ICD-10-CM | POA: Insufficient documentation

## 2012-05-01 DIAGNOSIS — Z139 Encounter for screening, unspecified: Secondary | ICD-10-CM

## 2012-05-01 LAB — POCT URINALYSIS DIPSTICK
Bilirubin, UA: NEGATIVE
Blood, UA: NEGATIVE
Glucose, UA: NEGATIVE
Nitrite, UA: NEGATIVE
Spec Grav, UA: 1.01
Urobilinogen, UA: NEGATIVE

## 2012-05-01 NOTE — Assessment & Plan Note (Signed)
No diverticulosis on  colonoscopy in 2009. Urine today clear. Symptoms may be relieved with BM.. ? Constipation/IBS causing pain. ? Irritation following recent antibiotics?  Will treat with probiotic, increase fiber and water , miralax daily prn constipation.  No clear skin rash. Also given adnexal location consider evaluation with US pelvic if not improving with above treatment.

## 2012-05-01 NOTE — Patient Instructions (Signed)
Will treat with probiotic such as Align, increase fiber ( metamucil) and water, miralax daily prn constipation.  Call or follow up if not improving in 1-2 weeks for ultrasound evaluation.

## 2012-05-01 NOTE — Progress Notes (Signed)
  Subjective:    Patient ID: [name], female    DOB: [birth year], 71 y.o.   MRN: [id number]  HPI  71 year old female with history of HTN, Afib presents with 1 month history of left lower abdominal pain. Describes as a stinging sensation, intermittent, notes after eating.  No change with particular foods. Occuring several times during the day (2-3ties a day), last minutes at a time No dysuria, no hematuria.  Some nausea, no vomiting or diarrhea. No bood stool. No vaginal bleeding. Occ mild constipation, has BMs every other day, but strains occasionally. She does have some relief of abdominal pain after BM.  No rash on skin noted.  Colonoscopy: 2009 Dr. Eugenia Pancoast normal. Has ovaries.. No family history of ovarian cancer.  Was on keflex, gentamycin ointment and for MRSA infected benign tumor.   Had heart ablation on 01/02/12 for atrial fibrillation... Has some residual stinging pain ever since in left flank. Not clearly related to issue she is here with. Has been able to come off medication for afib. Had catheter during this procedure.  Review of Systems  Constitutional: Negative for fever and fatigue.  HENT: Negative for ear pain.   Eyes: Negative for pain.  Respiratory: Negative for chest tightness and shortness of breath.   Cardiovascular: Negative for chest pain, palpitations and leg swelling.  Gastrointestinal: Negative for abdominal pain.  Genitourinary: Negative for dysuria.       Objective:   Physical Exam  Constitutional: Vital signs are normal. She appears well-developed and well-nourished. She is cooperative.  Non-toxic appearance. She does not appear ill. No distress.  HENT:  Head: Normocephalic.  Right Ear: Hearing, tympanic membrane, external ear and ear canal normal. Tympanic membrane is not erythematous, not retracted and not bulging.  Left Ear: Hearing, tympanic membrane, external ear and ear canal normal. Tympanic membrane is not erythematous, not retracted  and not bulging.  Nose: No mucosal edema or rhinorrhea. Right sinus exhibits no maxillary sinus tenderness and no frontal sinus tenderness. Left sinus exhibits no maxillary sinus tenderness and no frontal sinus tenderness.  Mouth/Throat: Uvula is midline, oropharynx is clear and moist and mucous membranes are normal.  Eyes: Conjunctivae, EOM and lids are normal. Pupils are equal, round, and reactive to light. No foreign bodies found.  Neck: Trachea normal and normal range of motion. Neck supple. Carotid bruit is not present. No mass and no thyromegaly present.  Cardiovascular: Normal rate, regular rhythm, S1 normal, S2 normal, normal heart sounds, intact distal pulses and normal pulses.  Exam reveals no gallop and no friction rub.   No murmur heard. Pulmonary/Chest: Effort normal and breath sounds normal. Not tachypneic. No respiratory distress. She has no decreased breath sounds. She has no wheezes. She has no rhonchi. She has no rales.  Abdominal: Soft. Normal appearance and bowel sounds are normal. There is no hepatosplenomegaly. There is no tenderness. There is no rebound and no CVA tenderness.  Neurological: She is alert.  Skin: Skin is warm, dry and intact. No rash noted.  Psychiatric: Her speech is normal and behavior is normal. Judgment and thought content normal. Her mood appears not anxious. Cognition and memory are normal. She does not exhibit a depressed mood.          Assessment & Plan:

## 2012-05-01 NOTE — Addendum Note (Signed)
Addended by: Ricki Miller on: 05/01/2012 04:02 PM   Modules accepted: Orders

## 2012-05-04 LAB — TB SKIN TEST: TB Skin Test: NEGATIVE

## 2012-06-18 ENCOUNTER — Other Ambulatory Visit: Payer: Self-pay | Admitting: Family Medicine

## 2012-07-28 ENCOUNTER — Other Ambulatory Visit: Payer: Self-pay | Admitting: Family Medicine

## 2012-08-19 ENCOUNTER — Emergency Department: Payer: Self-pay | Admitting: Emergency Medicine

## 2012-08-19 LAB — URINALYSIS, COMPLETE
Bilirubin,UR: NEGATIVE
Glucose,UR: NEGATIVE mg/dL (ref 0–75)
Ketone: NEGATIVE
Protein: NEGATIVE
RBC,UR: 400 /HPF (ref 0–5)
Specific Gravity: 1.009 (ref 1.003–1.030)
WBC UR: 21 /HPF (ref 0–5)

## 2012-08-19 LAB — CBC
HCT: 36.3 % (ref 35.0–47.0)
HGB: 12.2 g/dL (ref 12.0–16.0)
MCHC: 33.5 g/dL (ref 32.0–36.0)
MCV: 92 fL (ref 80–100)
RDW: 13.2 % (ref 11.5–14.5)

## 2012-08-19 LAB — COMPREHENSIVE METABOLIC PANEL
Alkaline Phosphatase: 73 U/L (ref 50–136)
Bilirubin,Total: 0.4 mg/dL (ref 0.2–1.0)
Calcium, Total: 9 mg/dL (ref 8.5–10.1)
Chloride: 109 mmol/L — ABNORMAL HIGH (ref 98–107)
Co2: 24 mmol/L (ref 21–32)
Creatinine: 1.36 mg/dL — ABNORMAL HIGH (ref 0.60–1.30)
Osmolality: 288 (ref 275–301)
Sodium: 142 mmol/L (ref 136–145)

## 2012-08-19 LAB — APTT: Activated PTT: 58.1 secs — ABNORMAL HIGH (ref 23.6–35.9)

## 2012-08-23 LAB — URINE CULTURE

## 2012-08-28 ENCOUNTER — Ambulatory Visit (INDEPENDENT_AMBULATORY_CARE_PROVIDER_SITE_OTHER): Payer: Medicare Other | Admitting: Family Medicine

## 2012-08-28 ENCOUNTER — Encounter: Payer: Self-pay | Admitting: Family Medicine

## 2012-08-28 VITALS — BP 140/86 | HR 61 | Temp 98.6°F | Ht 66.0 in | Wt 171.5 lb

## 2012-08-28 DIAGNOSIS — G8929 Other chronic pain: Secondary | ICD-10-CM | POA: Insufficient documentation

## 2012-08-28 DIAGNOSIS — N390 Urinary tract infection, site not specified: Secondary | ICD-10-CM | POA: Insufficient documentation

## 2012-08-28 DIAGNOSIS — M549 Dorsalgia, unspecified: Secondary | ICD-10-CM

## 2012-08-28 DIAGNOSIS — M546 Pain in thoracic spine: Secondary | ICD-10-CM

## 2012-08-28 DIAGNOSIS — M5442 Lumbago with sciatica, left side: Secondary | ICD-10-CM | POA: Insufficient documentation

## 2012-08-28 LAB — POCT URINALYSIS DIPSTICK
Bilirubin, UA: NEGATIVE
Blood, UA: NEGATIVE
Glucose, UA: NEGATIVE
Leukocytes, UA: NEGATIVE
Nitrite, UA: NEGATIVE
Urobilinogen, UA: NEGATIVE
pH, UA: 7

## 2012-08-28 NOTE — Assessment & Plan Note (Signed)
Resolved. Hematuria resolved as well.

## 2012-08-28 NOTE — Assessment & Plan Note (Addendum)
Likely trapezius muscle strain. Massage, heat, tylenol, stretch info given.  Let us know if blistering rash appears.. Shingles.

## 2012-08-28 NOTE — Progress Notes (Signed)
  Subjective:    Patient ID: [name], female    DOB: 08-17-1941, 71 y.o.   MRN: [id number]  HPI  71year old female with history of afib presents following ER visit to [facility] 1 week ago for blood in urine, frequency, urgency, no dysuria. No flank pain. No fever. Given IV fluids. Treated with Cipro x 7 days. Finished antibiotics yesterday.   No further blood in uirne, no frequency and urgency resolved. No fever. Only has upper mid back pain. Moderate No known injury. No activity change.  No cough, no SOB.   Review of Systems  Constitutional: Negative for fever and fatigue.  HENT: Negative for ear pain.   Eyes: Negative for pain.  Respiratory: Negative for chest tightness and shortness of breath.   Cardiovascular: Negative for chest pain, palpitations and leg swelling.  Gastrointestinal: Negative for abdominal pain.  Genitourinary: Negative for dysuria.  Musculoskeletal: Positive for back pain. Negative for myalgias and joint swelling.  Skin: Negative for rash.       Objective:   Physical Exam  Constitutional: Vital signs are normal. She appears well-developed and well-nourished. She is cooperative.  Non-toxic appearance. She does not appear ill. No distress.  HENT:  Head: Normocephalic.  Right Ear: Hearing, tympanic membrane, external ear and ear canal normal. Tympanic membrane is not erythematous, not retracted and not bulging.  Left Ear: Hearing, tympanic membrane, external ear and ear canal normal. Tympanic membrane is not erythematous, not retracted and not bulging.  Nose: No mucosal edema or rhinorrhea. Right sinus exhibits no maxillary sinus tenderness and no frontal sinus tenderness. Left sinus exhibits no maxillary sinus tenderness and no frontal sinus tenderness.  Mouth/Throat: Uvula is midline, oropharynx is clear and moist and mucous membranes are normal.  Eyes: Conjunctivae normal, EOM and lids are normal. Pupils are equal, round, and reactive to light. No foreign  bodies found.  Neck: Trachea normal and normal range of motion. Neck supple. Carotid bruit is not present. No mass and no thyromegaly present.  Cardiovascular: Normal rate, regular rhythm, S1 normal, S2 normal, normal heart sounds, intact distal pulses and normal pulses.  Exam reveals no gallop and no friction rub.   No murmur heard. Pulmonary/Chest: Effort normal and breath sounds normal. Not tachypneic. No respiratory distress. She has no decreased breath sounds. She has no wheezes. She has no rhonchi. She has no rales.  Abdominal: Soft. Normal appearance and bowel sounds are normal. There is no tenderness.  Musculoskeletal:       ttp over right trapezius under scapula, increase pain with movement  Neurological: She is alert.  Skin: Skin is warm, dry and intact. No rash noted.  Psychiatric: Her speech is normal and behavior is normal. Judgment and thought content normal. Her mood appears not anxious. Cognition and memory are normal. She does not exhibit a depressed mood.          Assessment & Plan:

## 2012-08-28 NOTE — Patient Instructions (Addendum)
For upper back pain stretching, heat. massage and tylenol for pain. Urinary symptoms resolved.

## 2012-09-02 ENCOUNTER — Other Ambulatory Visit: Payer: Self-pay | Admitting: Family Medicine

## 2012-10-04 ENCOUNTER — Other Ambulatory Visit: Payer: Self-pay | Admitting: Family Medicine

## 2012-11-12 ENCOUNTER — Other Ambulatory Visit: Payer: Self-pay

## 2012-11-12 MED ORDER — POTASSIUM CHLORIDE CRYS ER 20 MEQ PO TBCR: EXTENDED_RELEASE_TABLET | ORAL | Status: DC

## 2012-11-12 NOTE — Telephone Encounter (Signed)
Lattie Haw with [facility] wanted to verify potassium ok because pt is on spironolactone a potassium sparing diuretic and since potassium is from different doctor that Dr Diona Browner is aware. Had been refilled last 2 months and pt is out of med and scheduled cpx 11/30/12.Please advise.

## 2012-11-12 NOTE — Telephone Encounter (Signed)
Pt left v/m requesting refill [place] con to [facility]; pt out of med.pt already scheduled CPX 11/30/12. Klor con # 60 x 0. Left v/m for pt to call back. Unable to reach by phone.

## 2012-11-13 NOTE — Telephone Encounter (Signed)
Left message for patient on voice mail with recommendation

## 2012-11-13 NOTE — Telephone Encounter (Signed)
Have pt come in for her CPX labs prior to continuing potassiuma nd spirnolactone.

## 2012-11-14 NOTE — Telephone Encounter (Signed)
Lisa with [facility] request call back on drug interaction between potassium and spironolactone. Pt request call back today.

## 2012-11-14 NOTE — Telephone Encounter (Signed)
Pharmacy advised patient advised to hold medication (potassium) until physical appt

## 2012-11-23 ENCOUNTER — Other Ambulatory Visit: Payer: Medicare Other

## 2012-11-25 ENCOUNTER — Telehealth: Payer: Self-pay | Admitting: Family Medicine

## 2012-11-25 DIAGNOSIS — M949 Disorder of cartilage, unspecified: Secondary | ICD-10-CM

## 2012-11-25 DIAGNOSIS — D509 Iron deficiency anemia, unspecified: Secondary | ICD-10-CM

## 2012-11-25 DIAGNOSIS — E782 Mixed hyperlipidemia: Secondary | ICD-10-CM

## 2012-11-25 DIAGNOSIS — M899 Disorder of bone, unspecified: Secondary | ICD-10-CM

## 2012-11-25 DIAGNOSIS — E876 Hypokalemia: Secondary | ICD-10-CM

## 2012-11-25 DIAGNOSIS — I10 Essential (primary) hypertension: Secondary | ICD-10-CM

## 2012-11-25 NOTE — Telephone Encounter (Signed)
Message copied by [name] on Sun Nov 25, 2012 12:36 AM ------      Message from: Ellamae Sia      Created: Tue Nov 20, 2012  3:14 PM      Regarding: Lab orders for Monday, 2.17.14       Patient is scheduled for CPX labs, please order future labs, Thanks , Terri       ------

## 2012-11-26 ENCOUNTER — Other Ambulatory Visit (INDEPENDENT_AMBULATORY_CARE_PROVIDER_SITE_OTHER): Payer: Medicare Other

## 2012-11-26 DIAGNOSIS — E782 Mixed hyperlipidemia: Secondary | ICD-10-CM

## 2012-11-26 DIAGNOSIS — D509 Iron deficiency anemia, unspecified: Secondary | ICD-10-CM

## 2012-11-26 DIAGNOSIS — M899 Disorder of bone, unspecified: Secondary | ICD-10-CM

## 2012-11-26 DIAGNOSIS — E876 Hypokalemia: Secondary | ICD-10-CM

## 2012-11-26 LAB — LIPID PANEL
Cholesterol: 204 mg/dL — ABNORMAL HIGH (ref 0–200)
HDL: 76.8 mg/dL (ref >39.00)
Total CHOL/HDL Ratio: 3
Triglycerides: 59 mg/dL (ref 0.0–149.0)
VLDL: 11.8 mg/dL (ref 0.0–40.0)

## 2012-11-26 LAB — COMPREHENSIVE METABOLIC PANEL
AST: 46 U/L — ABNORMAL HIGH (ref 0–37)
Alkaline Phosphatase: 53 U/L (ref 39–117)
BUN: 21 mg/dL (ref 6–23)
Glucose, Bld: 94 mg/dL (ref 70–99)
Potassium: 4.3 mEq/L (ref 3.5–5.1)
Total Bilirubin: 0.7 mg/dL (ref 0.3–1.2)

## 2012-11-26 LAB — CBC WITH DIFFERENTIAL/PLATELET
Basophils Absolute: 0 10*3/uL (ref 0.0–0.1)
Eosinophils Relative: 4.5 % (ref 0.0–5.0)
HCT: 34.4 % — ABNORMAL LOW (ref 36.0–46.0)
Lymphocytes Relative: 36.8 % (ref 12.0–46.0)
Lymphs Abs: 1.8 10*3/uL (ref 0.7–4.0)
Monocytes Relative: 8.4 % (ref 3.0–12.0)
Neutrophils Relative %: 49.5 % (ref 43.0–77.0)
Platelets: 213 10*3/uL (ref 150.0–400.0)
RDW: 13.7 % (ref 11.5–14.6)
WBC: 4.9 10*3/uL (ref 4.5–10.5)

## 2012-11-26 LAB — LDL CHOLESTEROL, DIRECT: Direct LDL: 104.4 mg/dL

## 2012-11-30 ENCOUNTER — Encounter: Payer: Self-pay | Admitting: Family Medicine

## 2012-11-30 ENCOUNTER — Ambulatory Visit (INDEPENDENT_AMBULATORY_CARE_PROVIDER_SITE_OTHER): Payer: Medicare Other | Admitting: Family Medicine

## 2012-11-30 VITALS — BP 120/82 | HR 58 | Temp 98.5°F | Ht 66.0 in

## 2012-11-30 DIAGNOSIS — R7989 Other specified abnormal findings of blood chemistry: Secondary | ICD-10-CM

## 2012-11-30 DIAGNOSIS — E876 Hypokalemia: Secondary | ICD-10-CM

## 2012-11-30 DIAGNOSIS — E782 Mixed hyperlipidemia: Secondary | ICD-10-CM

## 2012-11-30 DIAGNOSIS — D509 Iron deficiency anemia, unspecified: Secondary | ICD-10-CM

## 2012-11-30 DIAGNOSIS — I10 Essential (primary) hypertension: Secondary | ICD-10-CM

## 2012-11-30 DIAGNOSIS — Z0000 Encounter for general adult medical examination without abnormal findings: Secondary | ICD-10-CM

## 2012-11-30 NOTE — Progress Notes (Signed)
HPI  I have personally reviewed the Medicare Annual Wellness questionnaire and have noted  1. The patient's medical and social history  2. Their use of alcohol, tobacco or illicit drugs  3. Their current medications and supplements  4. The patient's functional ability including ADL's, fall risks, home safety risks and hearing or visual  impairment.  5. Diet and physical activities  6. Evidence for depression or mood disorders  The patients weight, height, BMI and visual acuity have been recorded in the chart  I have made referrals, counseling and provided education to the patient based review of the above and I have provided the pt with a written personalized care plan for preventive services.  See the attached [place].   Hypertension: Well controlled  Using medication without problems or lightheadedness: None Chest pain with exertion: none  Edema:None  Short of breath:None  Average home BPs:120/70s  Other issues:   Elevated Cholesterol: Improved from 142 to 104. Goal <130.  Great job! Lab Results  Component Value Date   CHOL 204* 11/26/2012   HDL 76.80 11/26/2012   LDLCALC 120* 06/16/2011   LDLDIRECT 104.4 11/26/2012   TRIG 59.0 11/26/2012   CHOLHDL 3 11/26/2012  Diet control, no medication.  Other complaints: She has been staying more busy,  exercise walking few times a week, watching what she eats.   Sees Dr. Kathlen Mody and Dr. Caryl Comes.  Parox AFib maintaining NSR no longer on Tikosyn therapy, now maintained on pindolol, sinus bradycardia, aortic stenosis. Echo 2011 demonstrated normal left ventricular function and mild aortic stenosis  Felt recent chest pain due to GERD.. Started on prilosec.Marland Kitchen Resolved symptoms completely , not on now.   ? RA, stable off prednisone... not seeing rheumatologist.She was told only to return if having issues.   Elevated LFTS:  Unclear cause.  No history of transfusions. No drug use. NO exposure to hepatitis. Only rarely using tyelnol. No ETOH use.   She has started pradaxa in last year... This may e cause of LFT increase.   Review of Systems  Constitutional:  No fatigue, no fever. HENT: Negative for ear pain.  Eyes: Negative for pain.  Respiratory: Negative for chest tightness and shortness of breath.  Cardiovascular: Negative for chest pain, palpitations and leg swelling.  Gastrointestinal: Negative for abdominal pain.  Genitourinary: Negative for dysuria.  Skin: Negative for rash.  Psychiatric/Behavioral: Negative for dysphoric mood. The patient is not nervous/anxious.  No longer with back pain at this time... coc recurs but treated with tylenol. Objective:   Physical Exam  Constitutional: Vital signs are normal. She appears well-developed and well-nourished. She is cooperative. Non-toxic appearance. She does not appear ill. No distress.  HENT:  Head: Normocephalic.  Right Ear: Hearing, tympanic membrane, external ear and ear canal normal.  Left Ear: Hearing, tympanic membrane, external ear and ear canal normal.  Nose: Nose normal.  Eyes: Conjunctivae, EOM and lids are normal. Pupils are equal, round, and reactive to light. No foreign bodies found.  Neck: Trachea normal and normal range of motion. Neck supple. Carotid bruit is not present. No mass and no thyromegaly present.  Cardiovascular: Normal rate, regular rhythm, S1 normal, S2 normal and intact distal pulses. Exam reveals no gallop.  Murmur heard.  Systolic murmur is present with a grade of 2/6  No peripheral swelling B varicosisties in legs.  Pulmonary/Chest: Effort normal and breath sounds normal. No respiratory distress. She has no wheezes. She has no rhonchi. She has no rales.  Abdominal: Soft. Normal appearance and  bowel sounds are normal. She exhibits no distension, no fluid wave, no abdominal bruit and no mass. There is no hepatosplenomegaly. There is no tenderness. There is no rebound, no guarding and no CVA tenderness. No hernia.  Genitourinary: No breast swelling,  tenderness, discharge or bleeding. Pelvic exam was performed with patient prone.  Pt deferred DVE, no pap indicated.  Lymphadenopathy:  She has no cervical adenopathy.  She has no axillary adenopathy.  Neurological: She is alert. She has normal strength. No cranial nerve deficit or sensory deficit.  Skin: Skin is warm, dry and intact. No rash noted.  Psychiatric: Her speech is normal and behavior is normal. Judgment normal. Her mood appears not anxious. Cognition and memory are normal. She does not exhibit a depressed mood.  Assessment & Plan:   Annual Medicare Exam: The patient's preventative maintenance and recommended screening tests for an annual wellness exam were reviewed in full today.  Brought up to date unless services declined.  Counselled on the importance of diet, exercise, and its role in overall health and mortality.  The patient's FH and SH was reviewed, including their home life, tobacco status, and drug and alcohol status.   Osteopenia: last DEXA 2011, stable, repeat planned in 5 years Colonoscopy: 2009 Jacob's, repeat in 2019 Partial Hysterectomy: no pap indicated, no DVEindicated. No family or personal history of ovarian cancer, asymptomatic.  Vaccines: Uptodate, consider zostavax.  Mammogram: to be scheduled.

## 2012-11-30 NOTE — Assessment & Plan Note (Signed)
Avoid tylenol.  Will monitor.  Check hepatitis panel.

## 2012-11-30 NOTE — Assessment & Plan Note (Signed)
Stable control. 

## 2012-11-30 NOTE — Assessment & Plan Note (Signed)
At goal with lifestyle 

## 2012-11-30 NOTE — Assessment & Plan Note (Signed)
Hold potassium in next month. We will check K level, off potassium supplement to determine if this is needed to counterbalance spirnolactone.

## 2012-11-30 NOTE — Patient Instructions (Addendum)
Increase Vit D3  To 400 IU TWICE daily. Avoid tyelnol and alcohol. Return in 1 month for recehck of liver tests and hepatitis check. Hold potassium in next month. We will check K level off [otassium supplement to determine if this is needed to counterbalance spirnolactone. Schedule mammogram on your own.

## 2012-11-30 NOTE — Assessment & Plan Note (Signed)
Well controlled. Continue current medication.  

## 2012-12-28 ENCOUNTER — Other Ambulatory Visit (INDEPENDENT_AMBULATORY_CARE_PROVIDER_SITE_OTHER): Payer: Medicare Other

## 2012-12-28 DIAGNOSIS — R7989 Other specified abnormal findings of blood chemistry: Secondary | ICD-10-CM

## 2012-12-28 LAB — COMPREHENSIVE METABOLIC PANEL
ALT: 17 U/L (ref 0–35)
AST: 22 U/L (ref 0–37)
Calcium: 9.3 mg/dL (ref 8.4–10.5)
Chloride: 108 mEq/L (ref 96–112)
Creatinine, Ser: 1.3 mg/dL — ABNORMAL HIGH (ref 0.4–1.2)
Sodium: 140 mEq/L (ref 135–145)
Total Protein: 7.2 g/dL (ref 6.0–8.3)

## 2012-12-31 LAB — HEPATITIS PANEL, ACUTE
HCV Ab: NEGATIVE
Hep A IgM: NEGATIVE

## 2013-01-21 ENCOUNTER — Telehealth: Payer: Self-pay | Admitting: Family Medicine

## 2013-01-21 ENCOUNTER — Ambulatory Visit (INDEPENDENT_AMBULATORY_CARE_PROVIDER_SITE_OTHER): Payer: Medicare Other | Admitting: Family Medicine

## 2013-01-21 ENCOUNTER — Encounter: Payer: Self-pay | Admitting: Family Medicine

## 2013-01-21 VITALS — BP 122/74 | HR 65 | Temp 98.7°F | Wt 175.0 lb

## 2013-01-21 DIAGNOSIS — R0609 Other forms of dyspnea: Secondary | ICD-10-CM

## 2013-01-21 DIAGNOSIS — R0600 Dyspnea, unspecified: Secondary | ICD-10-CM

## 2013-01-21 DIAGNOSIS — R609 Edema, unspecified: Secondary | ICD-10-CM

## 2013-01-21 NOTE — Progress Notes (Signed)
Therapist, music at [facility] [place] [address] Phone: [phone] Fax: [phone]  Date:  01/21/2013   Name:  [name]   DOB:  12/03/1940   MRN:  [id number] Gender: female Age: 72 y.o.  Primary Physician:  Eliezer Lofts, MD  Evaluating MD: Owens Loffler, MD   Chief Complaint: bilateral lower extremity edema   History of Present Illness:  [name] is a 72 y.o. pleasant patient who presents with the following:  B LE Edema:  Both legs are swelling. Much more than normal. R > L  Really shiny yesterday.  Going on for over a week.  Mild AS in 2011 with normal EF Renal function recently normal A little more short of breath Has AF, s/p ablation, but she is on pradaxa  Comprehensive Metabolic Panel:    Component Value Date/Time   NA 140 12/28/2012 0825   K 3.9 12/28/2012 0825   CL 108 12/28/2012 0825   CO2 26 12/28/2012 0825   BUN 18 12/28/2012 0825   CREATININE 1.3* 12/28/2012 0825   GLUCOSE 97 12/28/2012 0825   CALCIUM 9.3 12/28/2012 0825   AST 22 12/28/2012 0825   ALT 17 12/28/2012 0825   ALKPHOS 55 12/28/2012 0825   BILITOT 0.8 12/28/2012 0825   PROT 7.2 12/28/2012 0825   ALBUMIN 3.9 12/28/2012 0825     CBC:    Component Value Date/Time   WBC 4.9 11/26/2012 0746   HGB 11.3* 11/26/2012 0746   HCT 34.4* 11/26/2012 0746   PLT 213.0 11/26/2012 0746   MCV 90.9 11/26/2012 0746   NEUTROABS 2.4 11/26/2012 0746   LYMPHSABS 1.8 11/26/2012 0746   MONOABS 0.4 11/26/2012 0746   EOSABS 0.2 11/26/2012 0746   BASOSABS 0.0 11/26/2012 0746      Patient Active Problem List  Diagnosis  . HYPERLIPIDEMIA  . HYPOKALEMIA, MILD  . ANEMIA, IRON DEFICIENCY  . UNSPECIFIED GLAUCOMA  . HYPERTENSION  . AORTIC STENOSIS  . ATRIAL FIBRILLATION  . SINUS BRADYCARDIA  . OSTEOPENIA  . Elevated LFTs    Past Medical History  Diagnosis Date  . Unspecified glaucoma(365.9)   . HYPERLIPIDEMIA   . HYPERTENSION   . AORTIC STENOSIS     a. echo 11/11: EF 55-60%,  mod LVH, mild AS (mean 12), mild AI, mild MR, mild LAE;  b. cath 2/09: no CAD, EF 70%  . Atrial fibrillation     On Tikosyn  . SINUS BRADYCARDIA   . OSTEOPENIA   . RA (rheumatoid arthritis)   . Pneumonia 2009  . Anemia   . Chest pain     Past Surgical History  Procedure Laterality Date  . Partial hysterectomy--1979    . Cardiac catheterization    . Thoracentesis   12/20/07    . Colonoscopy  04/18/08    History   Social History  . Marital Status: Widowed    Spouse Name: N/A    Number of Children: N/A  . Years of Education: N/A   Occupational History  . Not on file.   Social History Main Topics  . Smoking status: Never Smoker   . Smokeless tobacco: Never Used  . Alcohol Use: No  . Drug Use: No  . Sexually Active: Not on file   Other Topics Concern  . 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 Full Code.    Occasional exercise.   Diet: fruits and veggies, lean meats.    Family History  Problem Relation Age of Onset  . Pancreatic cancer    . Diabetes    . Heart disease      Allergies  Allergen Reactions  . Celebrex (Celecoxib)     Medication list has been reviewed and updated.  Outpatient Prescriptions Prior to Visit  Medication Sig Dispense Refill  . B Complex Vitamins (VITAMIN B COMPLEX) TABS 1 tab po qd       . Calcium Carbonate (CALCARB 600) 1500 MG TABS Take by mouth.        . dabigatran (PRADAXA) 150 MG CAPS Take 150 mg by mouth every 12 (twelve) hours.      . pindolol (VISKEN) 5 MG tablet Take 1 tablet (5 mg total) by mouth 2 (two) times daily.  60  tablet  6  . spironolactone (ALDACTONE) 25 MG tablet Take 1/2 tablet by mouth once daily  30 tablet  6  . timolol (TIMOPTIC) 0.5 % ophthalmic solution 1 drop as directed.         No facility-administered medications prior to visit.    Review of Systems:   GEN: No acute illnesses, no fevers, chills. GI: No n/v/d, eating normally Interactive and getting along well at home.  Otherwise, ROS is as per the HPI.   Physical Examination: BP 122/74  Pulse 65  Temp(Src) 98.7 F (37.1 C) (Oral)  Wt 175 lb (79.379 kg)  BMI 28.26 kg/m2  SpO2 98%  Ideal Body Weight:     GEN: WDWN, NAD, Non-toxic, A & O x 3 HEENT: Atraumatic, Normocephalic. Neck supple. No masses, No LAD. Ears and Nose: No external deformity. CV: RRR, No M/G/R. No JVD. No thrill. No extra heart sounds. PULM: CTA B, no wheezes, crackles, rhonchi. No retractions. No resp. distress. No accessory muscle use. EXTR: 1+ B LE edema. Negative Homan's sign. NEURO Normal gait.  PSYCH: Normally interactive. Conversant. Not depressed or anxious appearing.  Calm demeanor.    Assessment and Plan:  Edema - Plan: Basic metabolic panel, Hepatic function panel  Dyspnea - Plan: Brain natriuretic peptide  Seems most likely mild issue with venous return, but check below. Decrease salt, elevate feet, trial of compression stockings. If BP can tolerate it, could be low dose diuretic.  Orders Today:  Orders Placed This Encounter  Procedures  . Basic metabolic panel  . Hepatic function panel  . Brain natriuretic peptide    Updated Medication List: (Includes new medications, updates to list, dose adjustments) No orders of the defined types were placed in this encounter.    Medications Discontinued: There are no discontinued medications.    Signed, Maud Deed. Delona Clasby, MD 01/21/2013 4:17 PM

## 2013-01-21 NOTE — Telephone Encounter (Signed)
Patient Information:  Caller Name: Shamar  Phone: [phone]  Patient: [name], [name]  Gender: Female  DOB: May 22, 1941  Age: 72 Years  PCP: Eliezer Lofts (Family Practice)  Office Follow Up:  Does the office need to follow up with this patient?: No  Instructions For The Office: N/A  RN Note:  Right leg is more swollen than left leg.  Bruising present behind right ankle.  Mild shortness of breath.   Symptoms  Reason For Call & Symptoms: Increasing edema; both legs are swollen and shiny, and mild shortness of breath.  Reviewed Health History In EMR: Yes  Reviewed Medications In EMR: Yes  Reviewed Allergies In EMR: Yes  Reviewed Surgeries / Procedures: Yes  Date of Onset of Symptoms: 01/15/2013  Treatments Tried: elevate legs when sitting, avoiding sodium  Treatments Tried Worked: Yes  Guideline(s) Used:  Leg Swelling and Edema  Disposition Per Guideline:   Go to ED Now (or to Office with PCP Approval)  Reason For Disposition Reached:   Thigh, calf, or ankle swelling in both legs, but one side is definitely more swollen  Advice Given:  Call Back If:  Swelling becomes red or painful to the touch  Calf pain occurs and becomes constant  You become worse.  RN Overrode Recommendation:  Make Appointment  Office is open  Appointment Scheduled:  01/21/2013 16:00:00 Appointment Scheduled Provider:  Owens Loffler [facility])

## 2013-01-22 LAB — BASIC METABOLIC PANEL
BUN: 28 mg/dL — ABNORMAL HIGH (ref 6–23)
CO2: 22 mEq/L (ref 19–32)
Chloride: 108 mEq/L (ref 96–112)
Potassium: 4.6 mEq/L (ref 3.5–5.1)

## 2013-01-22 LAB — HEPATIC FUNCTION PANEL
ALT: 25 U/L (ref 0–35)
AST: 27 U/L (ref 0–37)
Bilirubin, Direct: 0.1 mg/dL (ref 0.0–0.3)
Total Bilirubin: 0.5 mg/dL (ref 0.3–1.2)

## 2013-02-19 ENCOUNTER — Telehealth: Payer: Self-pay

## 2013-02-19 NOTE — Telephone Encounter (Signed)
Pt to see cardiologist at [facility] 02/20/13 and pt request copy of recent lab results. Advised pt copy of recent lab results at front desk for pick up. Pt will pick up today.

## 2013-04-17 ENCOUNTER — Ambulatory Visit (INDEPENDENT_AMBULATORY_CARE_PROVIDER_SITE_OTHER): Payer: Medicare Other | Admitting: *Deleted

## 2013-04-17 DIAGNOSIS — Z111 Encounter for screening for respiratory tuberculosis: Secondary | ICD-10-CM

## 2013-04-19 ENCOUNTER — Encounter: Payer: Self-pay | Admitting: *Deleted

## 2013-09-09 ENCOUNTER — Ambulatory Visit (INDEPENDENT_AMBULATORY_CARE_PROVIDER_SITE_OTHER)
Admission: RE | Admit: 2013-09-09 | Discharge: 2013-09-09 | Disposition: A | Discharge status: Home / Self Care | Payer: Medicare Other | Source: Ambulatory Visit | Attending: Family Medicine | Admitting: Family Medicine

## 2013-09-09 ENCOUNTER — Ambulatory Visit (INDEPENDENT_AMBULATORY_CARE_PROVIDER_SITE_OTHER): Payer: Medicare Other | Admitting: Family Medicine

## 2013-09-09 ENCOUNTER — Telehealth: Payer: Self-pay | Admitting: Family Medicine

## 2013-09-09 ENCOUNTER — Encounter: Payer: Self-pay | Admitting: Family Medicine

## 2013-09-09 VITALS — BP 148/80 | HR 58 | Temp 98.1°F | Ht 66.0 in | Wt 172.2 lb

## 2013-09-09 DIAGNOSIS — M545 Low back pain, unspecified: Secondary | ICD-10-CM

## 2013-09-09 DIAGNOSIS — M25559 Pain in unspecified hip: Secondary | ICD-10-CM

## 2013-09-09 DIAGNOSIS — M25552 Pain in left hip: Secondary | ICD-10-CM

## 2013-09-09 IMAGING — CR DG LUMBAR SPINE COMPLETE 4+V
5 series · 5 of 5 positions shown · non-contrast
Comparison: None.

CLINICAL DATA: Low back pain

EXAM:
LUMBAR SPINE - COMPLETE 4+ VIEW

[view not recorded (1 of 5)]
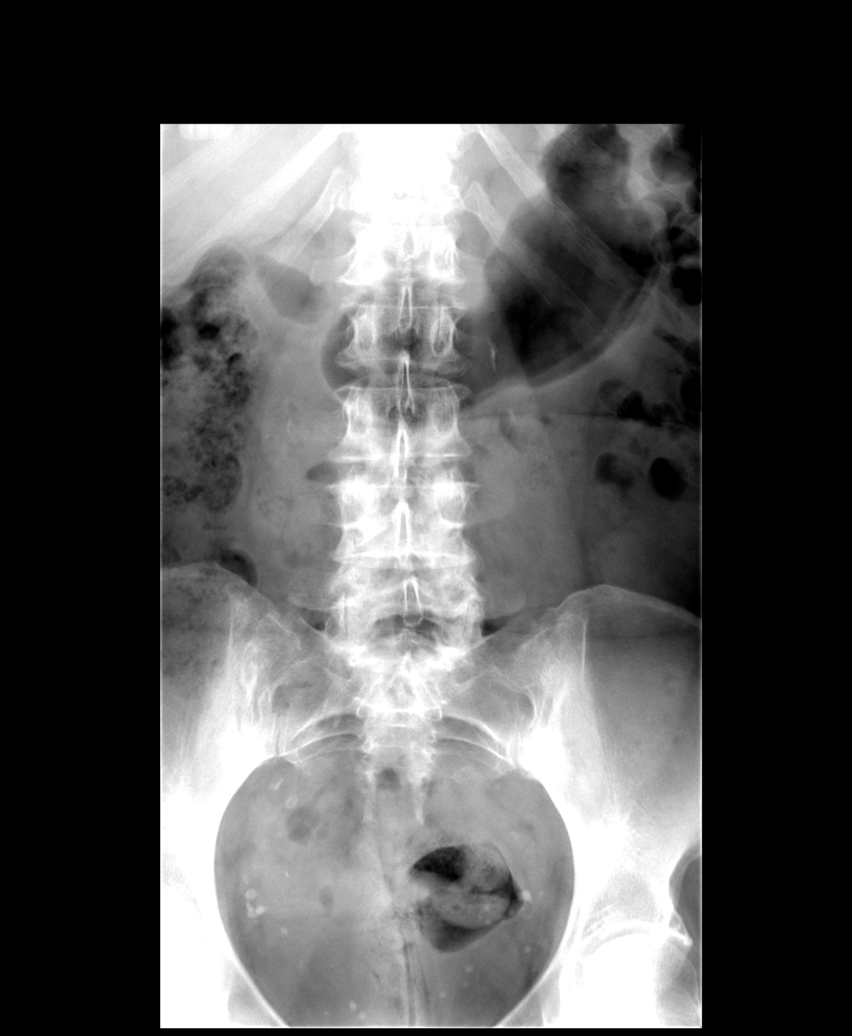

[view not recorded (2 of 5)]
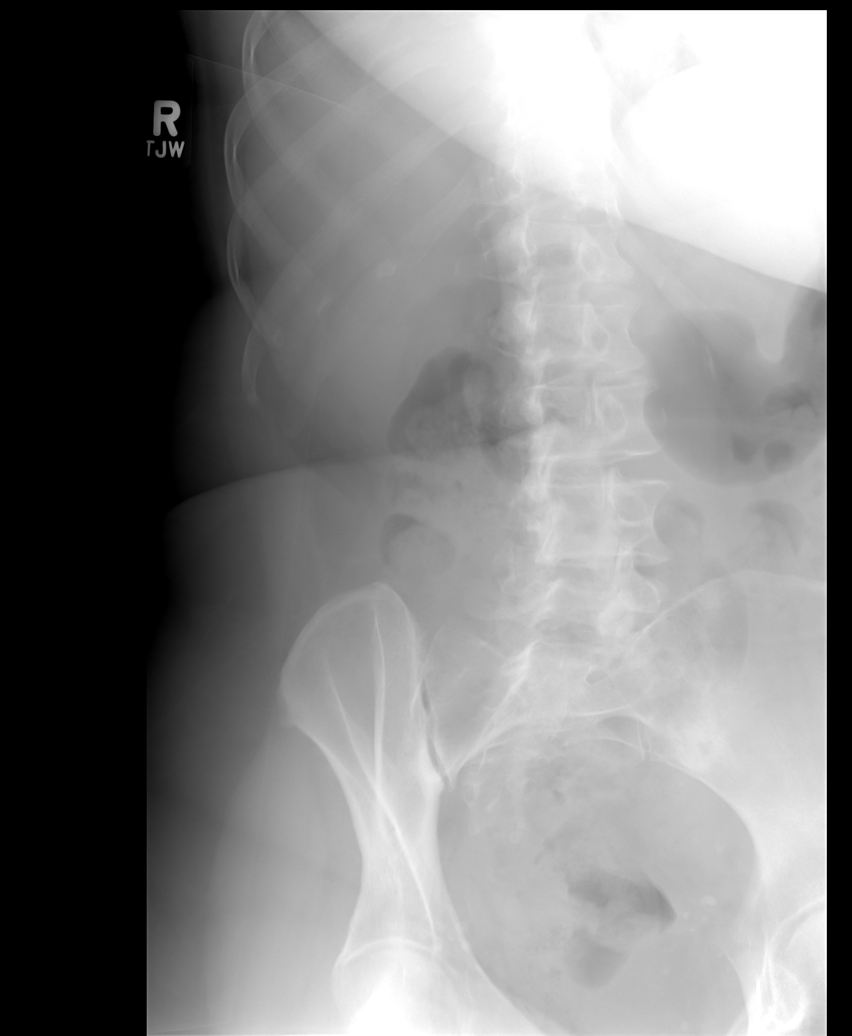

[view not recorded (3 of 5)]
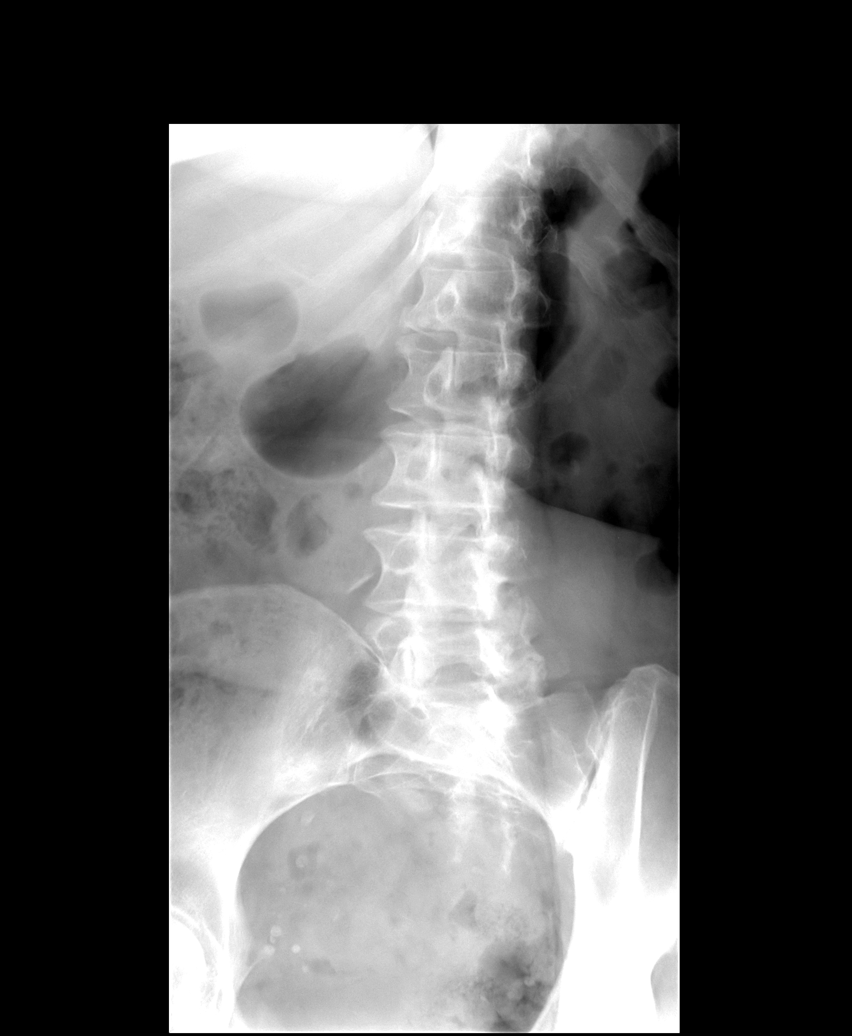

[view not recorded (4 of 5)]
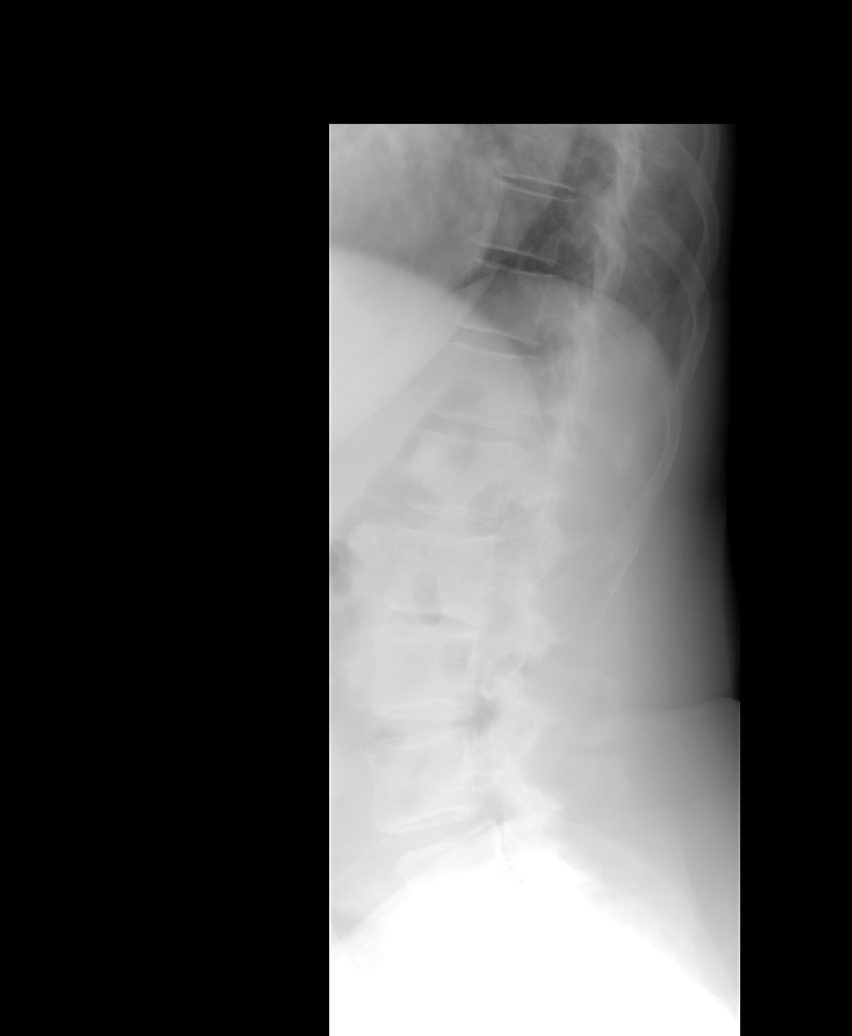

[view not recorded (5 of 5)]
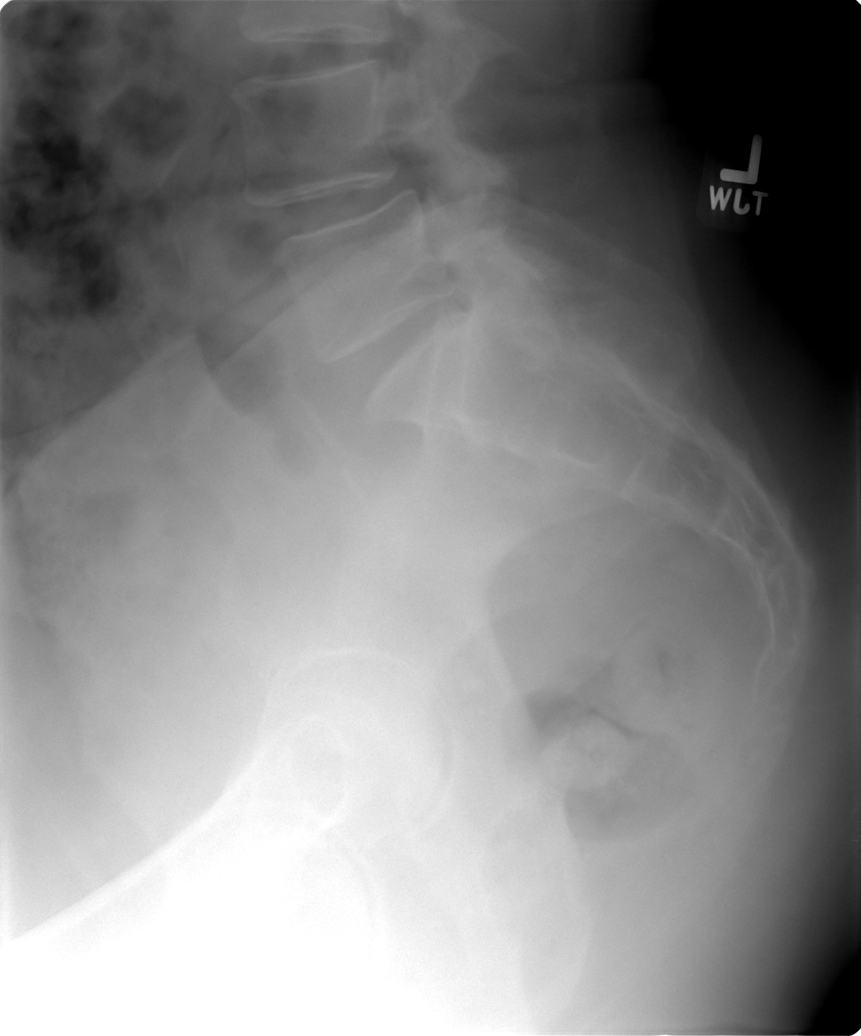

[5 of 5 positions shown; findings below may reference images not displayed]

FINDINGS: Grade 1 anterior slip L4-5. Remainder of the alignment is normal.
Negative for fracture. Disc spaces are intact. Negative for pars
defect.
IMPRESSION: Grade 1 slip L4-5.  No acute abnormality.

## 2013-09-09 MED ORDER — TRAMADOL HCL 50 MG PO TABS: 50.0000 mg | ORAL_TABLET | Freq: Four times a day (QID) | ORAL | Status: DC | PRN

## 2013-09-09 MED ORDER — PREDNISONE 20 MG PO TABS: ORAL_TABLET | ORAL | Status: DC

## 2013-09-09 MED ORDER — CYCLOBENZAPRINE HCL 5 MG PO TABS: 5.0000 mg | ORAL_TABLET | Freq: Three times a day (TID) | ORAL | Status: DC | PRN

## 2013-09-09 NOTE — Progress Notes (Signed)
Pre-visit discussion using our clinic review tool. No additional management support is needed unless otherwise documented below in the visit note.  

## 2013-09-09 NOTE — Progress Notes (Signed)
Patient Name: [name] Date of Birth: October 08, 1941 Medical Record Number: [id number] Gender: female  PCP: Eliezer Lofts, MD  History of Present Illness:  [name] is a 72 y.o. very pleasant female patient who presents with the following: Back Pain  ongoing for approximately: 5-6 days The patient has had back pain before. But it is not a recurring problem. The back pain is localized into the lumbar spine area. They also describe no radiculopathy. She is also having quite a bit of pain on the LEFT lateral and posterior hip. Along the pelvic rim. Friday, she started to get up and felt like she could hardly move. Could hardly get in the shower and had some lateral hip pain and back pain. At time she will have some muscle spasm and some hip pain.   No routine back problems and left side pain.   No numbness or tingling. No bowel or bladder incontinence. No focal weakness. Physical therapy: No Chiropractic manipulations: No Acupuncture: No Osteopathic manipulation: No  Past Medical History, Surgical History, Family History, Medications, Allergies have been reviewed and updated if relevant.  Review of Systems  GEN: No fevers, chills. Nontoxic. Primarily MSK c/o today. MSK: Detailed in the HPI GI: tolerating PO intake without difficulty Neuro: As above  Otherwise the pertinent positives of the ROS are noted above.    Physical Exam  Filed Vitals:   09/09/13 1444  BP: 148/80  Pulse: 58  Temp: 98.1 F (36.7 C)  TempSrc: Oral  Height: 5\' 6"  (1.676 m)  Weight: 172 lb 4 oz (78.132 kg)    Gen: Well-developed,well-nourished,in no acute distress; alert,appropriate and cooperative throughout examination HEENT: Normocephalic and atraumatic without obvious abnormalities.  Ears, externally no deformities Pulm: Breathing comfortably in no respiratory distress Range of motion at  the waist: Flexion, rotation and lateral bending: relatively preserved with mild restriction of  motion.  No echymosis or edema Rises to examination table with no difficulty Gait: minimally antalgic  Inspection/Deformity: No abnormality Paraspinus T:  Mild tenderness on the LEFT L4-S1 region, but more tender on the posterior pelvic rim on the LEFT side.  B Ankle Dorsiflexion (L5,4): 5/5 B Great Toe Dorsiflexion (L5,4): 5/5 Heel Walk (L5): WNL Toe Walk (S1): WNL Rise/Squat (L4): WNL, mild pain  SENSORY B Medial Foot (L4): WNL B Dorsum (L5): WNL B Lateral (S1): WNL Light Touch: WNL Pinprick: WNL  REFLEXES Knee (L4): 2+ Ankle (S1): 2+  B SLR, seated: neg B SLR, supine: neg B FABER: neg B Reverse FABER: neg B Greater Troch: NT B Log Roll: neg B Stork: NT B Sciatic Notch: NT  Low back pain - Plan: DG Lumbar Spine Complete  Hip pain, acute, left   Posterior pelvic rim pain with likely insertional tendinopathy with some bursitis. She also has some parasite spinous musculature aggravation.  Conservative algorithms for acute back pain generally begin with the following: NSAIDS - avoid due to the patient being on Pradaxa Muscle Relaxants Mild pain medication Start with medications, core rehab, and progress from there following low back pain algorithm. No red flags are present.   Meds ordered this encounter  Medications  . Calcium Carb-Cholecalciferol (CALCIUM 1000 + D) 1000-800 MG-UNIT TABS    Sig: Take 1 tablet by mouth daily.  . predniSONE (DELTASONE) 20 MG tablet    Sig: 2 tabs po for 4 days, then 1 po for 3 days    Dispense:  11 tablet    Refill:  0  . cyclobenzaprine (FLEXERIL)  5 MG tablet    Sig: Take 1 tablet (5 mg total) by mouth 3 (three) times daily as needed for muscle spasms.    Dispense:  30 tablet    Refill:  1  . traMADol (ULTRAM) 50 MG tablet    Sig: Take 1 tablet (50 mg total) by mouth every 6 (six) hours as needed.    Dispense:  50 tablet    Refill:  1

## 2013-09-09 NOTE — Patient Instructions (Signed)
Back pain:  Suggest moist heat: like a low level heating pad You can also use a water bottle Or a warm moist towel  A warm bath can often help Or a warm shower.  Massage is very helpful with back pain associated with muscle pain. If you have someone who lives with you who could give you a back massage, it would be a good idea.

## 2013-09-09 NOTE — Telephone Encounter (Signed)
Patient Information:  Caller Name: [name]  Phone: [phone]  Patient: [name], [name]  Gender: Female  DOB: 1940/11/10  Age: 72 Years  PCP: Eliezer Lofts (Family Practice)  Office Follow Up:  Does the office need to follow up with this patient?: No  Instructions For The Office: N/A  RN Note:  Appt scheduled for today(09/09/13) @ 1500 with Dr Lorelei Pont. Home care advice given.  Symptoms  Reason For Call & Symptoms: Pt calling about low back and left hip pain, hard to get out of bed and painful to walk. Rated pain "7".  Reviewed Health History In EMR: Yes  Reviewed Medications In EMR: Yes  Reviewed Allergies In EMR: Yes  Reviewed Surgeries / Procedures: Yes  Date of Onset of Symptoms: 09/07/2013  Treatments Tried: ice,Tylenol  Treatments Tried Worked: No  Guideline(s) Used:  Back Pain  Disposition Per Guideline:   See Today in Office  Reason For Disposition Reached:   Can't walk or can barely walk  Advice Given:  Cold or Heat:  Cold Pack: For pain or swelling, use a cold pack or ice wrapped in a wet cloth. Put it on the sore area for 20 minutes. Repeat 4 times on the first day, then as needed.  Activity  Keep doing your day-to-day activities if it is not too painful. Staying active is better than resting.  Avoid anything that makes your pain worse. Avoid heavy lifting, twisting, and too much exercise until your back heals.  Pain Medicines:  For pain relief, take acetaminophen, ibuprofen, or naproxen.  Call Back If:  Numbness or weakness occur  Bowel/bladder problems occur  You become worse.  Patient Will Follow Care Advice:  YES  Appointment Scheduled:  09/09/2013 15:00:00 Appointment Scheduled Provider:  Owens Loffler [facility])

## 2013-09-11 ENCOUNTER — Telehealth: Payer: Self-pay

## 2013-09-11 NOTE — Telephone Encounter (Signed)
She can try a 1/2 a tablet - sometimes it can cause nausea. At lower dose, risk is less.

## 2013-09-11 NOTE — Telephone Encounter (Signed)
Patient notified as instructed by telephone.  She will try taking 1/2 tablet to see how that does.

## 2013-09-11 NOTE — Telephone Encounter (Signed)
Left message for patient to return my call.

## 2013-09-11 NOTE — Telephone Encounter (Signed)
Pt has taken Tramadol 2 different times and both times pt was extremely nauseated, did not vomit. Pt still having hip pain but pt is continuing to take prednisone and does not want a different pain medication at this time. Pt wanted noted on her chart she cannot take Tramadol. Advised pt done.

## 2013-10-22 ENCOUNTER — Other Ambulatory Visit: Payer: Self-pay

## 2013-11-21 ENCOUNTER — Ambulatory Visit (INDEPENDENT_AMBULATORY_CARE_PROVIDER_SITE_OTHER): Payer: Medicare Other | Admitting: Family Medicine

## 2013-11-21 ENCOUNTER — Encounter: Payer: Self-pay | Admitting: Family Medicine

## 2013-11-21 VITALS — BP 130/80 | HR 56 | Temp 97.8°F | Ht 66.0 in | Wt 172.2 lb

## 2013-11-21 DIAGNOSIS — M654 Radial styloid tenosynovitis [de Quervain]: Secondary | ICD-10-CM

## 2013-11-21 DIAGNOSIS — M79609 Pain in unspecified limb: Secondary | ICD-10-CM

## 2013-11-21 DIAGNOSIS — M79622 Pain in left upper arm: Secondary | ICD-10-CM

## 2013-11-21 MED ORDER — HYDROCODONE-ACETAMINOPHEN 5-325 MG PO TABS: 1.0000 | ORAL_TABLET | Freq: Four times a day (QID) | ORAL | Status: DC | PRN

## 2013-11-21 NOTE — Patient Instructions (Signed)
Start wearing thumb spica splint daily. Start home PT exercises for both wrist and biceps.  Use tylenol for pain.  If not  improving in 2 weeks, follow up with Dr. Lorelei Pont.

## 2013-11-21 NOTE — Assessment & Plan Note (Signed)
NSAIDs contraindicated on pradaxa.  Start thumbs pica splint. Tylenol for pain. If break thorough pain... Can try vicodin.  if not improivng in 2 weeks follow up with Dr. Lorelei Pont.

## 2013-11-21 NOTE — Progress Notes (Signed)
Pre-visit discussion using our clinic review tool. No additional management support is needed unless otherwise documented below in the visit note.  

## 2013-11-21 NOTE — Progress Notes (Signed)
Subjective:    Patient ID: [name], female    DOB: 07-Mar-1941, 73 y.o.   MRN: [id number]  Muscle Pain Pertinent negatives include no abdominal pain, chest pain, dysuria, eye pain, fatigue, fever or shortness of breath.  Wrist Pain  Pertinent negatives include no fever.     73 year old female with history of  Afib on pradaxa, AS and high chol presents with new onset  right  Radial wrist pain x 1 month. Pain with using wrist, bending wrist. Pain 8/10 on pain scale.   Left upper arm has also been tender over bicep anteriorly.  Pain with lifting objects.  Ongoing 1 month as well.  Pain 8/10 on pain scale as well.  She was recently on prednisone , tramadol and muscle relaxant for hip pain now resolved. Did not tolerate tramadol for pain.   No injury.  She has been on pradaxa x 1 year after ablation.  NSAIDs contraindicated.   Review of Systems  Constitutional: Negative for fever and fatigue.  HENT: Negative for ear pain.   Eyes: Negative for pain.  Respiratory: Negative for chest tightness and shortness of breath.   Cardiovascular: Negative for chest pain, palpitations and leg swelling.  Gastrointestinal: Negative for abdominal pain.  Genitourinary: Negative for dysuria.       Objective:   Physical Exam  Constitutional: Vital signs are normal. She appears well-developed and well-nourished. She is cooperative.  Non-toxic appearance. She does not appear ill. No distress.  HENT:  Head: Normocephalic.  Right Ear: Hearing, tympanic membrane, external ear and ear canal normal. Tympanic membrane is not erythematous, not retracted and not bulging.  Left Ear: Hearing, tympanic membrane, external ear and ear canal normal. Tympanic membrane is not erythematous, not retracted and not bulging.  Nose: No mucosal edema or rhinorrhea. Right sinus exhibits no maxillary sinus tenderness and no frontal sinus tenderness. Left sinus exhibits no maxillary sinus tenderness and no frontal  sinus tenderness.  Mouth/Throat: Uvula is midline, oropharynx is clear and moist and mucous membranes are normal.  Eyes: Conjunctivae, EOM and lids are normal. Pupils are equal, round, and reactive to light. Lids are everted and swept, no foreign bodies found.  Neck: Trachea normal and normal range of motion. Neck supple. Carotid bruit is not present. No mass and no thyromegaly present.  Cardiovascular: Normal rate, regular rhythm, S1 normal, S2 normal, normal heart sounds, intact distal pulses and normal pulses.  Exam reveals no gallop and no friction rub.   No murmur heard. Pulmonary/Chest: Effort normal and breath sounds normal. Not tachypneic. No respiratory distress. She has no decreased breath sounds. She has no wheezes. She has no rhonchi. She has no rales.  Abdominal: Soft. Normal appearance and bowel sounds are normal. There is no tenderness.  Musculoskeletal:       Left shoulder: Normal.       Right wrist: She exhibits decreased range of motion, tenderness, bony tenderness and swelling.       Cervical back: Normal.       Right upper arm: She exhibits tenderness. She exhibits no bony tenderness.  No ttp over biceps tendon, neg drop arm, neg neers, full rom of shoulder B. ttp over belly of biceps.  Positive Finklestein test.  Neurological: She is alert.  Skin: Skin is warm, dry and intact. No rash noted.  Psychiatric: Her speech is normal and behavior is normal. Judgment and thought content normal. Her mood appears not anxious. Cognition and memory are normal. She does  not exhibit a depressed mood.          Assessment & Plan:

## 2013-11-21 NOTE — Assessment & Plan Note (Signed)
No sign of cervical radiculopathy, no sign of shoulder pathology or biceps tendonitis. Pain over belly of bicep.  Start home exercises.  If not improving follow up with Dr. Lorelei Pont, SM.

## 2014-01-30 ENCOUNTER — Telehealth: Payer: Self-pay | Admitting: Family Medicine

## 2014-01-30 DIAGNOSIS — D509 Iron deficiency anemia, unspecified: Secondary | ICD-10-CM

## 2014-01-30 DIAGNOSIS — E782 Mixed hyperlipidemia: Secondary | ICD-10-CM

## 2014-01-30 DIAGNOSIS — M949 Disorder of cartilage, unspecified: Secondary | ICD-10-CM

## 2014-01-30 DIAGNOSIS — E876 Hypokalemia: Secondary | ICD-10-CM

## 2014-01-30 DIAGNOSIS — M899 Disorder of bone, unspecified: Secondary | ICD-10-CM

## 2014-01-30 NOTE — Telephone Encounter (Signed)
Message copied by [name] on Thu Jan 30, 2014  5:29 PM ------      Message from: Ellamae Sia      Created: Thu Jan 23, 2014 10:40 AM      Regarding: Lab orders for Friday, 4.24.15       Patient is scheduled for CPX labs, please order future labs, Thanks , Terri       ------

## 2014-01-31 ENCOUNTER — Other Ambulatory Visit (INDEPENDENT_AMBULATORY_CARE_PROVIDER_SITE_OTHER): Payer: Medicare Other

## 2014-01-31 DIAGNOSIS — M899 Disorder of bone, unspecified: Secondary | ICD-10-CM

## 2014-01-31 DIAGNOSIS — M949 Disorder of cartilage, unspecified: Secondary | ICD-10-CM

## 2014-01-31 DIAGNOSIS — E782 Mixed hyperlipidemia: Secondary | ICD-10-CM

## 2014-01-31 DIAGNOSIS — D509 Iron deficiency anemia, unspecified: Secondary | ICD-10-CM

## 2014-01-31 DIAGNOSIS — E876 Hypokalemia: Secondary | ICD-10-CM

## 2014-01-31 LAB — COMPREHENSIVE METABOLIC PANEL
ALT: 12 U/L (ref 0–35)
AST: 23 U/L (ref 0–37)
Albumin: 3.9 g/dL (ref 3.5–5.2)
Alkaline Phosphatase: 48 U/L (ref 39–117)
BILIRUBIN TOTAL: 0.8 mg/dL (ref 0.3–1.2)
BUN: 15 mg/dL (ref 6–23)
CHLORIDE: 110 meq/L (ref 96–112)
CO2: 27 meq/L (ref 19–32)
Calcium: 9.5 mg/dL (ref 8.4–10.5)
Creatinine, Ser: 1.2 mg/dL (ref 0.4–1.2)
GFR: 56.31 mL/min — AB (ref >60.00)
GLUCOSE: 86 mg/dL (ref 70–99)
Potassium: 4.1 mEq/L (ref 3.5–5.1)
SODIUM: 142 meq/L (ref 135–145)
Total Protein: 7 g/dL (ref 6.0–8.3)

## 2014-01-31 LAB — CBC WITH DIFFERENTIAL/PLATELET
Basophils Absolute: 0 10*3/uL (ref 0.0–0.1)
Basophils Relative: 0.9 % (ref 0.0–3.0)
EOS PCT: 3.9 % (ref 0.0–5.0)
Eosinophils Absolute: 0.2 10*3/uL (ref 0.0–0.7)
HCT: 34.3 % — ABNORMAL LOW (ref 36.0–46.0)
Hemoglobin: 11.3 g/dL — ABNORMAL LOW (ref 12.0–15.0)
LYMPHS ABS: 2.1 10*3/uL (ref 0.7–4.0)
Lymphocytes Relative: 43.9 % (ref 12.0–46.0)
MCHC: 32.8 g/dL (ref 30.0–36.0)
MCV: 91.2 fl (ref 78.0–100.0)
Monocytes Absolute: 0.4 10*3/uL (ref 0.1–1.0)
Monocytes Relative: 7.3 % (ref 3.0–12.0)
Neutro Abs: 2.1 10*3/uL (ref 1.4–7.7)
Neutrophils Relative %: 44 % (ref 43.0–77.0)
PLATELETS: 188 10*3/uL (ref 150.0–400.0)
RBC: 3.76 Mil/uL — AB (ref 3.87–5.11)
RDW: 13.6 % (ref 11.5–14.6)
WBC: 4.8 10*3/uL (ref 4.5–10.5)

## 2014-01-31 LAB — LIPID PANEL
CHOLESTEROL: 194 mg/dL (ref 0–200)
HDL: 73.4 mg/dL (ref >39.00)
LDL Cholesterol: 113 mg/dL — ABNORMAL HIGH (ref 0–99)
Total CHOL/HDL Ratio: 3
Triglycerides: 36 mg/dL (ref 0.0–149.0)
VLDL: 7.2 mg/dL (ref 0.0–40.0)

## 2014-02-01 LAB — VITAMIN D 25 HYDROXY (VIT D DEFICIENCY, FRACTURES): Vit D, 25-Hydroxy: 31 ng/mL (ref 30–89)

## 2014-02-07 ENCOUNTER — Ambulatory Visit (INDEPENDENT_AMBULATORY_CARE_PROVIDER_SITE_OTHER): Payer: Medicare Other | Admitting: Family Medicine

## 2014-02-07 ENCOUNTER — Encounter: Payer: Self-pay | Admitting: Family Medicine

## 2014-02-07 VITALS — BP 130/80 | HR 54 | Temp 98.0°F | Ht 63.0 in | Wt 173.2 lb

## 2014-02-07 DIAGNOSIS — Z0000 Encounter for general adult medical examination without abnormal findings: Secondary | ICD-10-CM

## 2014-02-07 DIAGNOSIS — I10 Essential (primary) hypertension: Secondary | ICD-10-CM

## 2014-02-07 DIAGNOSIS — E782 Mixed hyperlipidemia: Secondary | ICD-10-CM

## 2014-02-07 NOTE — Assessment & Plan Note (Signed)
Well controlled. Continue current medication.  

## 2014-02-07 NOTE — Patient Instructions (Addendum)
Consider returning to  Rheumatologist for RA eval at [facility]. Consider shingles vaccine and prevnar. Call schedule mammogram on your own. Work on [employer], regular exercise and weight loss.

## 2014-02-07 NOTE — Progress Notes (Signed)
HPI  I have personally reviewed the Medicare Annual Wellness questionnaire and have noted  1. The patient's medical and social history  2. Their use of alcohol, tobacco or illicit drugs  3. Their current medications and supplements  4. The patient's functional ability including ADL's, fall risks, home safety risks and hearing or visual  impairment.  5. Diet and physical activities  6. Evidence for depression or mood disorders  The patients weight, height, BMI and visual acuity have been recorded in the chart  I have made referrals, counseling and provided education to the patient based review of the above and I have provided the pt with a written personalized care plan for preventive services.  See the attached [place].   Hypertension: Well controlled  BP Readings from Last 3 Encounters:  02/07/14 130/80  11/21/13 130/80  09/09/13 148/80  Using medication without problems or lightheadedness: None  Chest pain with exertion: none  Edema:None  Short of breath:None  Average home BPs:120/70s  Other issues:   Elevated Cholesterol:LDL at goal <130.   Lab Results  Component Value Date   CHOL 194 01/31/2014   HDL 73.40 01/31/2014   LDLCALC 113* 01/31/2014   LDLDIRECT 104.4 11/26/2012   TRIG 36.0 01/31/2014   CHOLHDL 3 01/31/2014   Diet control, no medication.   Other complaints: She has been staying more busy, exercise walking few times a week, watching what she eats.   Potassium is normal  Vit D is nml.   Parox AFib maintaining NSR no longer on Tikosyn therapy, now maintained on pindolol, sinus bradycardia, aortic stenosis. Echo 2011 demonstrated normal left ventricular function and mild aortic stenosis   Followed by [facility]  ? RA, stable off prednisone... not seeing rheumatologist.She may return given recent joint issues.  Elevated LFTS: resolved. Review of Systems  Constitutional: No fatigue, no fever.  HENT: Negative for ear pain.  Eyes: Negative for pain.  Respiratory:  Negative for chest tightness and shortness of breath.  Cardiovascular: Negative for chest pain, palpitations and leg swelling.  Gastrointestinal: Negative for abdominal pain.  Genitourinary: Negative for dysuria.  Skin: Negative for rash.  Psychiatric/Behavioral: Negative for dysphoric mood. The patient is not nervous/anxious.  No longer with back pain at this time... coc recurs but treated with tylenol.  Objective:   Physical Exam  Constitutional: Vital signs are normal. She appears well-developed and well-nourished. She is cooperative. Non-toxic appearance. She does not appear ill. No distress.  HENT:  Head: Normocephalic.  Right Ear: Hearing, tympanic membrane, external ear and ear canal normal.  Left Ear: Hearing, tympanic membrane, external ear and ear canal normal.  Nose: Nose normal.  Eyes: Conjunctivae, EOM and lids are normal. Pupils are equal, round, and reactive to light. No foreign bodies found.  Neck: Trachea normal and normal range of motion. Neck supple. Carotid bruit is not present. No mass and no thyromegaly present.  Cardiovascular: Normal rate, regular rhythm, S1 normal, S2 normal and intact distal pulses. Exam reveals no gallop.  Murmur heard.  Systolic murmur is present with a grade of 2/6  No peripheral swelling B varicosisties in legs.  Pulmonary/Chest: Effort normal and breath sounds normal. No respiratory distress. She has no wheezes. She has no rhonchi. She has no rales.  Abdominal: Soft. Normal appearance and bowel sounds are normal. She exhibits no distension, no fluid wave, no abdominal bruit and no mass. There is no hepatosplenomegaly. There is no tenderness. There is no rebound, no guarding and no CVA tenderness. No hernia.  Genitourinary: No breast swelling, tenderness, discharge or bleeding. Pelvic exam was performed with patient prone.   no pap/DVE indicated.  Lymphadenopathy:  She has no cervical adenopathy.  She has no axillary adenopathy.   Neurological: She is alert. She has normal strength. No cranial nerve deficit or sensory deficit.  Skin: Skin is warm, dry and intact. No rash noted.  Psychiatric: Her speech is normal and behavior is normal. Judgment normal. Her mood appears not anxious. Cognition and memory are normal. She does not exhibit a depressed mood.  Assessment & Plan:   Annual Medicare Exam: The patient's preventative maintenance and recommended screening tests for an annual wellness exam were reviewed in full today.  Brought up to date unless services declined.  Counselled on the importance of diet, exercise, and its role in overall health and mortality.  The patient's FH and SH was reviewed, including their home life, tobacco status, and drug and alcohol status.   Osteopenia: last DEXA 2011, stable, repeat planned in 5 years  Colonoscopy: 2009  Dr.Jacob, repeat in 2019  Partial Hysterectomy: no pap indicated, no DVE indicated. No family or personal history of ovarian cancer, asymptomatic.  Vaccines: Uptodate, consider zostavax, prevnar.  Mammogram: to be scheduled.

## 2014-02-07 NOTE — Assessment & Plan Note (Signed)
Well controlled 

## 2014-02-07 NOTE — Progress Notes (Signed)
Pre visit review using our clinic review tool, if applicable. No additional management support is needed unless otherwise documented below in the visit note. 

## 2014-02-10 ENCOUNTER — Telehealth: Payer: Self-pay | Admitting: Family Medicine

## 2014-02-10 NOTE — Telephone Encounter (Signed)
Relevant patient education assigned to patient using Emmi. ° °

## 2014-02-18 ENCOUNTER — Encounter: Payer: Self-pay | Admitting: Internal Medicine

## 2014-03-05 ENCOUNTER — Encounter: Payer: Self-pay | Admitting: Internal Medicine

## 2014-06-12 ENCOUNTER — Ambulatory Visit (INDEPENDENT_AMBULATORY_CARE_PROVIDER_SITE_OTHER): Payer: Medicare Other | Admitting: Family Medicine

## 2014-06-12 ENCOUNTER — Encounter: Payer: Self-pay | Admitting: Family Medicine

## 2014-06-12 VITALS — BP 140/70 | HR 60 | Temp 98.3°F | Ht 63.0 in | Wt 174.8 lb

## 2014-06-12 DIAGNOSIS — M654 Radial styloid tenosynovitis [de Quervain]: Secondary | ICD-10-CM

## 2014-06-12 DIAGNOSIS — M25539 Pain in unspecified wrist: Secondary | ICD-10-CM

## 2014-06-12 DIAGNOSIS — M25531 Pain in right wrist: Secondary | ICD-10-CM

## 2014-06-12 MED ORDER — METHYLPREDNISOLONE ACETATE 40 MG/ML IJ SUSP
20.0000 mg | Freq: Once | INTRAMUSCULAR | Status: AC
  Administered 2014-06-12: 20 mg via INTRA_ARTICULAR

## 2014-06-12 NOTE — Progress Notes (Signed)
Pre visit review using our clinic review tool, if applicable. No additional management support is needed unless otherwise documented below in the visit note. 

## 2014-06-12 NOTE — Progress Notes (Signed)
Dr. Frederico Hamman T. Leston Schueller, MD, [facility] [place], [address] Phone: [phone] Fax: [phone]  06/12/2014  Patient: [name], MRN: [id number], DOB: 1941/07/19, 73 y.o.  Primary Physician:  Eliezer Lofts, MD  Chief Complaint: Hand Pain  Subjective:   [name] is a 73 y.o. very pleasant female patient who presents with the following:  For the last 4 or 5 months the patient has had some pain and some swelling in the dorsum of her wrist. She saw my partner 11/2013, and she was given a thumb spica splint. She is on Pradaxa and cannot tolerate antiinflammatories.   She has not had any specific injury or incident. 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  She has not had any trauma or fall. She is not really having any bruising. She is having some swelling, mostly dorsally around the carpal region as well as in the distal radius.  Past Medical History, Surgical History, Social History, Family History, Problem List, Medications, and Allergies have been reviewed and updated if relevant.  GEN: No fevers, chills. Nontoxic. Primarily MSK c/o today. MSK: Detailed in the HPI GI: tolerating PO intake without difficulty Neuro: No numbness, parasthesias, or tingling associated. Otherwise the pertinent positives of the ROS are noted above.   Objective:   BP 140/70  Pulse 60  Temp(Src) 98.3 F (36.8 C) (Oral)  Ht 5\' 3"  (1.6 m)  Wt 174 lb 12 oz (79.266 kg)  BMI 30.96 kg/m2   GEN: WDWN, NAD, Non-toxic, Alert & Oriented x 3 HEENT: Atraumatic, Normocephalic.  Ears and Nose: No external deformity. EXTR: No clubbing/cyanosis/edema NEURO: Normal gait.  PSYCH: Normally interactive. Conversant. Not depressed or anxious appearing.  Calm demeanor.   Hand: R Ecchymosis or edema: neg ROM wrist/hand/digits/elbow:  mild terminal motion restriction Carpals, MCP's, digits: NT Distal Ulna and Radius: mildly full and tender to palpation Supination lift test: neg Ecchymosis or edema: neg Cysts/nodules: neg Finkelstein's test: pos Snuffbox tenderness: neg Scaphoid tubercle: NT Hook of Hamate: NT Resisted supination: NT Full composite fist Grip, all digits: 5/5 str No tenosynovitis Axial load test: mildly tttp Phalen's: neg Tinel's: neg Atrophy: mild thenar atrophy  Hand sensation: intact   Radiology: Diagnostic Ultrasound Evaluation Terason t3000, MSK ultrasound, MSK probe Anatomy scanned: limited wrist, RIGHT Indication: Pain Findings: The first and second dorsal compartments were scanned. Mild amount of increased area of hypoechoic space in the first dorsal compartment, and comparatively in the second dorsal compartment, tendons are visualized with notable increased in hypoechogenicity surrounding the tendon. This would correspond clinically the tenosynovitis. Area marked for injection site.   Assessment and Plan:   De Quervain's tenosynovitis, right  Right wrist pain - Plan: methylPREDNISolone acetate (DEPO-MEDROL) injection 20 mg  More properly, I think this is 2nd dorsal compartmental tenosynovitis, and this was injected after locating with u/s.  2nd dorsal compartmental Tenosynovitis Injection Verbal consent was obtained. Risks (including rare risk of infection, risk of skin atrophy, risk of skin bleaching), benefits, and alternatives were discussed. Prepped with Chloraprep and Ethyl Chloride used for anesthesia. Under sterile conditions, after tendons identified with u/s probe and space. Aspiration yields no blood. Decreased pain post injection. No complications. Needle size: 22 gauge Injection: 1/2 cc of Lidocaine 1% and Depo-Medrol 20 mg   Follow-up: No Follow-up on file.  Signed,  Frederico Hamman  Celedonio Savage, MD   Patient's Medications  New Prescriptions   No medications on file    Previous Medications   B COMPLEX VITAMINS (VITAMIN B COMPLEX) TABS    1 tab po qd    CALCIUM CARB-CHOLECALCIFEROL (CALCIUM 1000 + D) 1000-800 MG-UNIT TABS    Take 1 tablet by mouth daily.   DABIGATRAN (PRADAXA) 150 MG CAPS    Take 150 mg by mouth every 12 (twelve) hours.   HYDROCODONE-ACETAMINOPHEN (NORCO/VICODIN) 5-325 MG PER TABLET    Take 1 tablet by mouth every 6 (six) hours as needed for moderate pain.   PINDOLOL (VISKEN) 5 MG TABLET    Take 1 tablet (5 mg total) by mouth 2 (two) times daily.   SPIRONOLACTONE (ALDACTONE) 25 MG TABLET    Take 1/2 tablet by mouth once daily  Modified Medications   No medications on file  Discontinued Medications   No medications on file

## 2014-06-18 ENCOUNTER — Encounter: Payer: Self-pay | Admitting: Gastroenterology

## 2014-09-23 ENCOUNTER — Ambulatory Visit: Payer: Self-pay | Admitting: Ophthalmology

## 2014-10-08 ENCOUNTER — Ambulatory Visit: Payer: Self-pay | Admitting: Ophthalmology

## 2014-11-14 DIAGNOSIS — H35341 Macular cyst, hole, or pseudohole, right eye: Secondary | ICD-10-CM | POA: Diagnosis not present

## 2014-12-10 DIAGNOSIS — H40003 Preglaucoma, unspecified, bilateral: Secondary | ICD-10-CM | POA: Diagnosis not present

## 2014-12-29 ENCOUNTER — Telehealth: Payer: Self-pay

## 2014-12-29 NOTE — Telephone Encounter (Signed)
Left message for pt to call back if she still wants flu vaccine 

## 2015-01-05 ENCOUNTER — Telehealth: Payer: Self-pay

## 2015-01-05 NOTE — Telephone Encounter (Signed)
Left message for pt to call back if she still wants flu vaccine 

## 2015-01-31 DIAGNOSIS — J309 Allergic rhinitis, unspecified: Secondary | ICD-10-CM | POA: Diagnosis not present

## 2015-01-31 DIAGNOSIS — H1013 Acute atopic conjunctivitis, bilateral: Secondary | ICD-10-CM | POA: Diagnosis not present

## 2015-02-03 ENCOUNTER — Ambulatory Visit (INDEPENDENT_AMBULATORY_CARE_PROVIDER_SITE_OTHER): Payer: Medicare Other | Admitting: Family Medicine

## 2015-02-03 ENCOUNTER — Encounter: Payer: Self-pay | Admitting: Family Medicine

## 2015-02-03 VITALS — BP 140/86 | HR 60 | Temp 98.4°F | Ht 63.0 in | Wt 178.5 lb

## 2015-02-03 DIAGNOSIS — J301 Allergic rhinitis due to pollen: Secondary | ICD-10-CM | POA: Diagnosis not present

## 2015-02-03 DIAGNOSIS — J309 Allergic rhinitis, unspecified: Secondary | ICD-10-CM | POA: Insufficient documentation

## 2015-02-03 MED ORDER — GUAIFENESIN-CODEINE 100-10 MG/5ML PO SOLN: 5.0000 mL | Freq: Every evening | ORAL | Status: DC | PRN

## 2015-02-03 NOTE — Patient Instructions (Signed)
Continue using fluticasone nasal spray and pataday eye drops Start taking an OTC allergy medicine (like claritin or zyrtec) Take the cough suppressant at bedtime as needed.  Call if shortness of breath develops, symptoms get worse, or you continue to cough up bloody mucus

## 2015-02-03 NOTE — Progress Notes (Signed)
Subjective:     Patient ID: [name], female   DOB: May 23, 1941, 74 y.o.   MRN: [id number]  HPI  [name] is a 74 y/o F presenting for follow up from urgent care. Scratchy throat on Monday, tried chloraseptic. Developed coughing and sneezing later in the week and went to urgent care on Saturday. At urgent care on Saturday, was given fluticasone nasal spray and pataday eye drops for allergies. Has had fall allergies before, denies significant allergy sx this spring. Thinks the eye drops and nasal spray are not helping much. Coughed up bloody mucus on Friday, has only been noticing blood in mucus in the mornings, no bloody mucus with coughing this morning. Cough is worse at night, keeping her up at night. Warm tea helps with cough. Denies fevers, chills. Denies chronic cough. Never smoker. Denies sick contacts. No hx of asthma.     Review of Systems  Constitutional: Positive for fatigue. Negative for fever and chills.  HENT: Positive for congestion, ear pain, rhinorrhea, sinus pressure, sneezing and sore throat.   Eyes: Positive for itching.  Respiratory: Positive for cough. Negative for shortness of breath.        Objective:   Physical Exam  Constitutional: She appears well-developed and well-nourished.  HENT:  Head: Normocephalic and atraumatic.  Right Ear: Tympanic membrane and external ear normal.  Left Ear: Tympanic membrane and external ear normal.  Nose: Rhinorrhea present.  Mouth/Throat: Mucous membranes are normal. Posterior oropharyngeal erythema present. No oropharyngeal exudate.  Cardiovascular: Normal rate, regular rhythm, S1 normal and S2 normal.  Exam reveals no gallop and no friction rub.   No murmur heard. Pulmonary/Chest: Effort normal and breath sounds normal. She has no wheezes.  Psychiatric: She has a normal mood and affect. Her behavior is normal.       Assessment:     1. Cough: Likely due to allergies or viral URI. Bloody mucus likely due to irritation  from coughing, sore throat. Less likely due to a more serious cause d/t lack of smoking hx, no chronic cough, short duration of sx, bloody mucus only in the mornings.    Plan:     1. Cough: Add an oral allergy medication (loratidine or cetirizine) to current regimen of fluticasone nasal spray and pataday eye drops. Will give a cough suppressant to help with cough at night. Return if sx worsen, SOB develops, or continue to cough up bloody mucus.       Dante Gang served as Education administrator for Dr. Diona Browner 02/03/15 9:30 am  Patient seen and examined. Med student acted as Education administrator only.  HPI/ROS/PE and assessment/plan created  By MD and scribed by med student.  Eliezer Lofts MD

## 2015-02-03 NOTE — Progress Notes (Signed)
Pre visit review using our clinic review tool, if applicable. No additional management support is needed unless otherwise documented below in the visit note. 

## 2015-02-04 NOTE — Op Note (Signed)
PATIENT NAME:  [name], [name] MR#:  [id number] DATE OF BIRTH:  28-Dec-1940  DATE OF PROCEDURE:  10/08/2014  PREOPERATIVE DIAGNOSIS: Cataract and macular hole, right eye.   POSTOPERATIVE DIAGNOSIS: Cataract and macular hole, right eye.   PROCEDURE: Phacoemulsification with posterior chamber intraocular lens, right eye, as well at 25-gauge pars plana vitrectomy with ICG membrane peel, endolaser, air-fluid exchange and 28% SF6 in the right eye.   ANESTHESIA: General.   COMPLICATIONS: None.   BLOOD LOSS: Minimal.   DESCRIPTION OF PROCEDURE: The patient was examined in the clinic for a macular hole in the right eye. After a long discussion with the patient including risks, benefits, alternatives, and complications it was decided to proceed with pars plana vitrectomy. Of note, the patient also had a dense nuclear sclerosis and cortical cataract which needed to be addressed prior to membrane peeling to ensure visibility during this procedure and so the decision was made to proceed with a combination pars plana vitrectomy with phacoemulsification in the right eye. On the day of surgery, the patient was greeted in the preoperative holding area. The consents were reviewed. The right eye was marked. The patient was brought into the Operating Room in supine position and placed under general anesthesia. The phacoemulsification part of the procedure was first. A peribulbar block was placed, 8 mL in the inferonasal fornix using a conjunctival cutdown. A side-port blade was used to make a paracentesis. An air bubble was placed in the eye, followed by VisionBlue dye which was then rinsed with BSS and then Viscoat was injected into the anterior chamber. The 3 trocars were then placed in the usual position. The inferotemporal trocar was placed to ensure it was in the vitreous cavity before starting the infusion. The infusion was then clamped. The keratome was then used to make a corneal incision at about the level of  the 10 o'clock position. A cystotome was used to start a capsulorrhexis, which was finished with Utrata forceps for 360 degrees in a continuous curvilinear fashion. BSS on a cannula was used to hydrodissect the lens. The phacoemulsification handpiece was used then to remove the nucleus with phaco-chop maneuver. The irrigation-aspiration handpiece was then used to remove any residual cortex then the lens ZCB00, 21 diopter lens, serial [id number] was injected into the capsule after filling it with Viscoat. The lens was then adjusted into its proper position using a Sinskey hook. Irrigation-aspiration handpiece was then used to remove the Viscoat from around the lens in the bag and in the anterior chamber. One 10-0 nylon suture was then placed at the wound and the wounds were hydrated and felt to be watertight. Attention was then turned to the posterior chamber. A core vitrectomy was performed. 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 Attention was then drawn to the macula. ICG was again placed on the macular surface, followed by removal of the ICG. A macular contact lens and ILM forceps were then used to visualize the ILM and peel 360 degrees around the large macular hole which was very large for a radius of about 2 disk diameters at least. Attention was then drawn to the periphery and again there was 360 degrees of scleral depression. 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 An air-fluid exchange was then performed and the laser was completed. A complete air-fluid exchange was performed followed by 4 times the vitreous volume infusion of 28% SF6. The trocars  were then removed and the wounds were felt to be watertight. The eye had a good pressure by palpation. The remainder of the block was given along with subconjunctival cefuroxime and dexamethasone. The eye was then patched and shielded and taken to the recovery area in stable condition after bringing the patient out of general anesthesia.    ____________________________ Laban Emperor. Oval Linsey, MD jdr:at D: 10/08/2014 09:48:52 ET T: 10/08/2014 15:46:48 ET JOB#: [id number]  cc: Janett Billow D. Oval Linsey, MD, <Dictator> Laban Emperor [facility] MD ELECTRONICALLY SIGNED 10/15/2014 11:04

## 2015-04-09 ENCOUNTER — Telehealth: Payer: Self-pay | Admitting: Family Medicine

## 2015-04-09 DIAGNOSIS — D509 Iron deficiency anemia, unspecified: Secondary | ICD-10-CM

## 2015-04-09 DIAGNOSIS — E782 Mixed hyperlipidemia: Secondary | ICD-10-CM

## 2015-04-09 DIAGNOSIS — M8580 Other specified disorders of bone density and structure, unspecified site: Secondary | ICD-10-CM

## 2015-04-09 NOTE — Telephone Encounter (Signed)
-----   Message from Ellamae Sia sent at 04/03/2015  3:16 PM EDT ----- Regarding: Lab orders for Friday, 7.1.16 Patient is scheduled for CPX labs, please order future labs, Thanks , Karna Christmas

## 2015-04-10 ENCOUNTER — Other Ambulatory Visit (INDEPENDENT_AMBULATORY_CARE_PROVIDER_SITE_OTHER): Payer: Medicare Other

## 2015-04-10 DIAGNOSIS — E782 Mixed hyperlipidemia: Secondary | ICD-10-CM

## 2015-04-10 DIAGNOSIS — M8580 Other specified disorders of bone density and structure, unspecified site: Secondary | ICD-10-CM | POA: Diagnosis not present

## 2015-04-10 DIAGNOSIS — E559 Vitamin D deficiency, unspecified: Secondary | ICD-10-CM | POA: Diagnosis not present

## 2015-04-10 DIAGNOSIS — D509 Iron deficiency anemia, unspecified: Secondary | ICD-10-CM

## 2015-04-10 LAB — COMPREHENSIVE METABOLIC PANEL
ALT: 14 U/L (ref 0–35)
AST: 24 U/L (ref 0–37)
Albumin: 4 g/dL (ref 3.5–5.2)
Alkaline Phosphatase: 59 U/L (ref 39–117)
BILIRUBIN TOTAL: 0.7 mg/dL (ref 0.2–1.2)
BUN: 22 mg/dL (ref 6–23)
CALCIUM: 9.8 mg/dL (ref 8.4–10.5)
CO2: 29 meq/L (ref 19–32)
Chloride: 104 mEq/L (ref 96–112)
Creatinine, Ser: 1.34 mg/dL — ABNORMAL HIGH (ref 0.40–1.20)
GFR: 49.89 mL/min — ABNORMAL LOW (ref >60.00)
Glucose, Bld: 97 mg/dL (ref 70–99)
Potassium: 4.1 mEq/L (ref 3.5–5.1)
SODIUM: 140 meq/L (ref 135–145)
Total Protein: 7.5 g/dL (ref 6.0–8.3)

## 2015-04-10 LAB — CBC WITH DIFFERENTIAL/PLATELET
Basophils Absolute: 0 10*3/uL (ref 0.0–0.1)
Basophils Relative: 0.7 % (ref 0.0–3.0)
EOS PCT: 3.7 % (ref 0.0–5.0)
Eosinophils Absolute: 0.2 10*3/uL (ref 0.0–0.7)
HEMATOCRIT: 35.4 % — AB (ref 36.0–46.0)
HEMOGLOBIN: 11.9 g/dL — AB (ref 12.0–15.0)
LYMPHS ABS: 2.1 10*3/uL (ref 0.7–4.0)
Lymphocytes Relative: 38.4 % (ref 12.0–46.0)
MCHC: 33.7 g/dL (ref 30.0–36.0)
MCV: 90.7 fl (ref 78.0–100.0)
MONO ABS: 0.4 10*3/uL (ref 0.1–1.0)
MONOS PCT: 6.9 % (ref 3.0–12.0)
NEUTROS ABS: 2.8 10*3/uL (ref 1.4–7.7)
Neutrophils Relative %: 50.3 % (ref 43.0–77.0)
Platelets: 211 10*3/uL (ref 150.0–400.0)
RBC: 3.9 Mil/uL (ref 3.87–5.11)
RDW: 13.9 % (ref 11.5–15.5)
WBC: 5.5 10*3/uL (ref 4.0–10.5)

## 2015-04-10 LAB — LIPID PANEL
CHOL/HDL RATIO: 3
CHOLESTEROL: 196 mg/dL (ref 0–200)
HDL: 67 mg/dL (ref >39.00)
LDL Cholesterol: 118 mg/dL — ABNORMAL HIGH (ref 0–99)
NonHDL: 129
Triglycerides: 55 mg/dL (ref 0.0–149.0)
VLDL: 11 mg/dL (ref 0.0–40.0)

## 2015-04-10 LAB — VITAMIN D 25 HYDROXY (VIT D DEFICIENCY, FRACTURES): VITD: 20.94 ng/mL — ABNORMAL LOW (ref 30.00–100.00)

## 2015-04-17 ENCOUNTER — Encounter: Payer: Self-pay | Admitting: Family Medicine

## 2015-04-17 ENCOUNTER — Ambulatory Visit (INDEPENDENT_AMBULATORY_CARE_PROVIDER_SITE_OTHER): Payer: Medicare Other | Admitting: Family Medicine

## 2015-04-17 VITALS — BP 138/82 | HR 67 | Temp 98.2°F | Ht 63.0 in | Wt 177.8 lb

## 2015-04-17 DIAGNOSIS — E559 Vitamin D deficiency, unspecified: Secondary | ICD-10-CM | POA: Diagnosis not present

## 2015-04-17 DIAGNOSIS — Z23 Encounter for immunization: Secondary | ICD-10-CM | POA: Diagnosis not present

## 2015-04-17 DIAGNOSIS — Z1231 Encounter for screening mammogram for malignant neoplasm of breast: Secondary | ICD-10-CM

## 2015-04-17 DIAGNOSIS — I10 Essential (primary) hypertension: Secondary | ICD-10-CM

## 2015-04-17 DIAGNOSIS — Z0000 Encounter for general adult medical examination without abnormal findings: Secondary | ICD-10-CM | POA: Diagnosis not present

## 2015-04-17 DIAGNOSIS — E782 Mixed hyperlipidemia: Secondary | ICD-10-CM | POA: Diagnosis not present

## 2015-04-17 DIAGNOSIS — I480 Paroxysmal atrial fibrillation: Secondary | ICD-10-CM

## 2015-04-17 DIAGNOSIS — Z7189 Other specified counseling: Secondary | ICD-10-CM | POA: Insufficient documentation

## 2015-04-17 DIAGNOSIS — E669 Obesity, unspecified: Secondary | ICD-10-CM

## 2015-04-17 DIAGNOSIS — M8580 Other specified disorders of bone density and structure, unspecified site: Secondary | ICD-10-CM

## 2015-04-17 MED ORDER — VITAMIN D (ERGOCALCIFEROL) 1.25 MG (50000 UNIT) PO CAPS: 50000.0000 [IU] | ORAL_CAPSULE | ORAL | Status: DC

## 2015-04-17 NOTE — Addendum Note (Signed)
Addended byCloyd Stagers B on: 04/17/2015 12:19 PM   Modules accepted: Orders

## 2015-04-17 NOTE — Progress Notes (Signed)
I have personally reviewed the Medicare Annual Wellness questionnaire and have noted 1. The patient's medical and social history 2. Their use of alcohol, tobacco or illicit drugs 3. Their current medications and supplements 4. The patient's functional ability including ADL's, fall risks, home safety risks and hearing or visual             impairment. 5. Diet and physical activities 6. Evidence for depression or mood disorders 7.         Updated provider list Cognitive evaluation was performed and recorded on pt medicare questionnaire form. The patients weight, height, BMI and visual acuity have been recorded in the chart  I have made referrals, counseling and provided education to the patient based review of the above and I have provided the pt with a written personalized care plan for preventive services.   Hypertension: Well controlled on pindolol and spirnolactone BP Readings from Last 3 Encounters:  04/17/15 138/82  02/03/15 140/86  06/12/14 140/70  Using medication without problems or lightheadedness: None  Chest pain with exertion: none  Edema:None  Short of breath:None  Average home BPs:140/70 Other issues:   Elevated Cholesterol: LDL at goal <130 on no med. Lab Results  Component Value Date   CHOL 196 04/10/2015   HDL 67.00 04/10/2015   LDLCALC 118* 04/10/2015   LDLDIRECT 104.4 11/26/2012   TRIG 55.0 04/10/2015   CHOLHDL 3 04/10/2015  Diet control, no medication.    exercise  None, joined a gym but not getting time to go.  Body mass index is 31.5 kg/(m^2).  Potassium is normal Vit D is low.  Parox Afib maintaining NSR no longer on Tikosyn therapy, now maintained on pindolol, sinus bradycardia, aortic stenosis. Echo 2011 demonstrated normal left ventricular function and mild aortic stenosis  Follow[facility] by [facility] On Pradaxa for anticogaulant  RA, stable off prednisone... not seeing rheumatologist.  Elevated LFTS: resolved.   Review of Systems   Constitutional: No fatigue, no fever.  HENT: Negative for ear pain.  Eyes: Negative for pain.  Respiratory: Negative for chest tightness and shortness of breath.  Cardiovascular: Negative for chest pain, palpitations and leg swelling.  Gastrointestinal: Negative for abdominal pain.  Genitourinary: Negative for dysuria.  Skin: Negative for rash.  Psychiatric/Behavioral: Negative for dysphoric mood. The patient is not nervous/anxious.  No longer with back pain at this time... coc recurs but treated with tylenol.  Objective:   Physical Exam  Constitutional: Vital signs are normal. She appears well-developed and well-nourished. She is cooperative. Non-toxic appearance. She does not appear ill. No distress.  HENT:  Head: Normocephalic.  Right Ear: Hearing, tympanic membrane, external ear and ear canal normal.  Left Ear: Hearing, tympanic membrane, external ear and ear canal normal.  Nose: Nose normal.  Eyes: Conjunctivae, EOM and lids are normal. Pupils are equal, round, and reactive to light. No foreign bodies found.  Neck: Trachea normal and normal range of motion. Neck supple. Carotid bruit is not present. No mass and no thyromegaly present.  Cardiovascular: Normal rate, regular rhythm, S1 normal, S2 normal and intact distal pulses. Exam reveals no gallop.  Murmur heard.  Systolic murmur is present with a grade of 2/6  No peripheral swelling B varicosisties in legs.  Pulmonary/Chest: Effort normal and breath sounds normal. No respiratory distress. She has no wheezes. She has no rhonchi. She has no rales.  Abdominal: Soft. Normal appearance and bowel sounds are normal. She exhibits no distension, no fluid wave, no abdominal bruit and no mass. There  is no hepatosplenomegaly. There is no tenderness. There is no rebound, no guarding and no CVA tenderness. No hernia.  Genitourinary: No breast swelling, tenderness, discharge or bleeding. Pelvic exam was performed with  patient prone.  no pap/DVE indicated.  Lymphadenopathy:  She has no cervical adenopathy.  She has no axillary adenopathy.  Neurological: She is alert. She has normal strength. No cranial nerve deficit or sensory deficit.  Skin: Skin is warm, dry and intact. No rash noted.  Psychiatric: Her speech is normal and behavior is normal. Judgment normal. Her mood appears not anxious. Cognition and memory are normal. She does not exhibit a depressed mood.  Assessment & Plan:   Annual Medicare Exam: The patient's preventative maintenance and recommended screening tests for an annual wellness exam were reviewed in full today.  Brought up to date unless services declined.  Counselled on the importance of diet, exercise, and its role in overall health and mortality.  The patient's FH and SH was reviewed, including their home life, tobacco status, and drug and alcohol status.   Osteopenia: last DEXA 2011, stable, repeat planned in 5 years  Colonoscopy: 2009 Dr.Jacob, repeat in 2019  Partial Hysterectomy: no pap indicated, no DVE indicated. No family or personal history of ovarian cancer, asymptomatic.  Vaccines: Uptodate,  Given prevnar today, consider zostavax, tdap. Mammogram: to be scheduled.

## 2015-04-17 NOTE — Patient Instructions (Addendum)
Get back on track with healthy eating and regular exercise.  Stop at front desk to set up nutrition referral. Replete with vit D 50, 000 units weekly x 12 weeks then return to daily OTC vit D 3.  Follow BP at home if not improving with weight loss, exercise and healthy eating, call.  Stop at front desk to set up bone density and mammogram.  Look into coverage of tetanus ( Tdap) and shingles ( zostavax) vaccines.

## 2015-04-17 NOTE — Assessment & Plan Note (Signed)
Well controlled on no med. 

## 2015-04-17 NOTE — Assessment & Plan Note (Signed)
Replete with vit D 50, 000 units weekly x 12 weeks then return to daily OTC vit D 3.

## 2015-04-17 NOTE — Assessment & Plan Note (Signed)
Well controlled. Continue current medication.  

## 2015-04-28 ENCOUNTER — Ambulatory Visit
Admission: RE | Admit: 2015-04-28 | Discharge: 2015-04-28 | Disposition: A | Discharge status: Home / Self Care | Payer: Medicare Other | Source: Ambulatory Visit | Attending: Family Medicine | Admitting: Family Medicine

## 2015-04-28 ENCOUNTER — Telehealth: Payer: Self-pay | Admitting: Family Medicine

## 2015-04-28 ENCOUNTER — Other Ambulatory Visit: Payer: Self-pay | Admitting: Family Medicine

## 2015-04-28 DIAGNOSIS — M8580 Other specified disorders of bone density and structure, unspecified site: Secondary | ICD-10-CM

## 2015-04-28 DIAGNOSIS — Z1231 Encounter for screening mammogram for malignant neoplasm of breast: Secondary | ICD-10-CM | POA: Insufficient documentation

## 2015-04-28 DIAGNOSIS — M85861 Other specified disorders of bone density and structure, right lower leg: Secondary | ICD-10-CM | POA: Insufficient documentation

## 2015-04-28 DIAGNOSIS — Z780 Asymptomatic menopausal state: Secondary | ICD-10-CM | POA: Diagnosis not present

## 2015-04-28 DIAGNOSIS — M8588 Other specified disorders of bone density and structure, other site: Secondary | ICD-10-CM | POA: Diagnosis not present

## 2015-04-28 LAB — HM MAMMOGRAPHY

## 2015-04-28 NOTE — Telephone Encounter (Signed)
[name] notified as instructed by telephone.

## 2015-04-28 NOTE — Telephone Encounter (Signed)
Notify pt bone density  Shows stable osteopenia. Continue ca , vit D and weight bearing exercsie. recehck in 2-5 years.

## 2015-05-11 ENCOUNTER — Encounter: Payer: Medicare Other | Attending: Family Medicine | Admitting: Dietician

## 2015-05-11 VITALS — Ht 63.0 in | Wt 176.3 lb

## 2015-05-11 DIAGNOSIS — I10 Essential (primary) hypertension: Secondary | ICD-10-CM | POA: Diagnosis not present

## 2015-05-11 NOTE — Progress Notes (Signed)
Medical Nutrition Therapy: Visit start time: 0930  end time: 1030  Assessment:  Diagnosis: Hyertension Past medical history: atrial fibrillation per patient Psychosocial issues/ stress concerns: none per patient Preferred learning method:  . Visual  Current weight: 176.3lbs  Height: 5'3" Medications, supplements: reviewed list in chart Progress and evaluation: Patient reports struggle to control blood pressure recently and also would like to lose weight in effort to control BP.   Physical activity: none; provides care for elderly patient  Dietary Intake:  Usual eating pattern includes 2 meals and 2-3 snacks per day. Dining out frequency: 2-3 meals per week.  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 Snack: fruit, or variety of other snacks. Likes sweets Supper: skips if she has had lunch out, otherwise meat, maybe veg., dessert Snack: usually no bedtime snack Beverages: water, coffee in am.  Nutrition Care Education: Topics covered: low sodium diet Basic nutrition: appropriate nutrient balance, general nutrition guidelines    Weight control: behavioral changes for weight loss Hypertension: identifying high sodium foods, identifying food sources of Calcium, potassium, magnesium   Nutritional Diagnosis:  NI-5.5 Imbalance of nutrients As related to excessive sugar intake and sodium intake.  As evidenced by patient report.  Intervention: Discussion as noted above.    Encouraged pt to keep a food diary to track food intake.    Discussed healthier options for desserts/sweet snack foods.  Education Materials given:  . General diet guidelines for Hypertension . Food record . Food lists/ Planning A Heart Healthy Meal . Sample meal pattern/ menus: Quick and Healthy Meal Ideas . Goals/ instructions   Learner/ who was taught:  . Patient    Level of understanding: Marland Kitchen Verbalizes/ demonstrates competency  Demonstrated degree of understanding via:   Teach back Learning barriers: . None  Willingness to learn/ readiness for change: . Eager, change in progress  Monitoring and Evaluation:   follow up: prn        Patient declined scheduling a follow-up visit at this time.

## 2015-05-11 NOTE — Patient Instructions (Signed)
   Increase fruit and vegetable intake by having one or two of each with your meals.  Aim for a small serving of a sweet daily or less. Can use fruit for dessert and dress it up with yogurt, cool whip, sugar free jello, or small amount of chocolate syrup.  Keep chicking sodium on foods -- aim for an average of 500mg  with each meal.  Keep a food diary -- write down what you eat as well as how much (portions).

## 2015-05-14 ENCOUNTER — Encounter: Payer: Self-pay | Admitting: *Deleted

## 2015-06-01 ENCOUNTER — Encounter: Payer: Medicare Other | Admitting: Dietician

## 2015-06-01 VITALS — Ht 63.0 in | Wt 177.0 lb

## 2015-06-01 DIAGNOSIS — I10 Essential (primary) hypertension: Secondary | ICD-10-CM

## 2015-06-01 NOTE — Progress Notes (Signed)
Medical Nutrition Therapy: Visit start time: 0930  end time: 1000  Assessment:  Diagnosis: HTN Medical history changes: no changes per patient Psychosocial issues/ stress concerns: none   Current weight: 177.0lbs  Height: 5'3" Medications, supplement changes: no changes since 8/1 Progress and evaluation: Patient's food diary reflects averaging 8 servings of vegetables and fruits daily.  Physical activity: caring for elderly patient. Walked on [employer] while on vacation.        Has investigated internet exercise programs  Dietary Intake:  Usual eating pattern includes 3 meals and 3 fruit snacks per day. Dining out frequency: 2-3 meals per week.  Breakfast: small portion starch, protein, fruit Snack: fruit Lunch: salad with chicken, salmon, veg., cornbread Snack: fruit Supper: whole grain starch, 1cup vegetables, chicken or other protein Snack: fruit, occasionally sherbet Beverages: coffee, water  Nutrition Care Education: Topics covered:  Hypertension:  Reviewed patient's food diary, and commended her for positive changes made and nutritional quality of current eating pattern. Exercise:  Discussed role of exercise in improving blood pressure, and in weight management. Discussed options for patient to increase exercise.  Nutritional Diagnosis:  [place]-3.3 Overweight/obesity As related to history of excess caloric intake, low level of activity.  As evidenced by patient report.  Intervention: Discussion as noted above.   Updated goals with options for increasing exercise.   Did not schedule further follow-up at this time, patient to call if needed.   Education Materials given:  Marland Kitchen Goals/ instructions . Chair/ desk exercises for building strength  Learner/ who was taught:  . Patient   Level of understanding: Marland Kitchen Verbalizes/ demonstrates competency   Demonstrated degree of understanding via:   Teach back Learning barriers: . None  Willingness to learn/ readiness for change: . Eager,  change in progress   Monitoring and Evaluation:   follow up: prn

## 2015-06-01 NOTE — Patient Instructions (Signed)
   Gradually increase exercise, eventual goal is 30 minutes most days of the week. Try [email or web address].com for some exercise options to build strength, start light and build up as able.   Another option would be using a pedometer, step counter wristband, or step counter app on cell phone. Ideal goal is 10,000 steps daily.   Continue with great healthy food choices!  OK to leave off snack(s) if you are not hungry.

## 2015-06-19 DIAGNOSIS — H40003 Preglaucoma, unspecified, bilateral: Secondary | ICD-10-CM | POA: Diagnosis not present

## 2015-08-12 DIAGNOSIS — I4581 Long QT syndrome: Secondary | ICD-10-CM | POA: Diagnosis not present

## 2015-08-12 DIAGNOSIS — I480 Paroxysmal atrial fibrillation: Secondary | ICD-10-CM | POA: Diagnosis not present

## 2015-08-12 DIAGNOSIS — Z7901 Long term (current) use of anticoagulants: Secondary | ICD-10-CM | POA: Diagnosis not present

## 2015-08-12 DIAGNOSIS — Z79899 Other long term (current) drug therapy: Secondary | ICD-10-CM | POA: Diagnosis not present

## 2015-08-12 DIAGNOSIS — Z8249 Family history of ischemic heart disease and other diseases of the circulatory system: Secondary | ICD-10-CM | POA: Diagnosis not present

## 2015-08-12 DIAGNOSIS — Z885 Allergy status to narcotic agent status: Secondary | ICD-10-CM | POA: Diagnosis not present

## 2015-08-12 DIAGNOSIS — I499 Cardiac arrhythmia, unspecified: Secondary | ICD-10-CM | POA: Diagnosis not present

## 2015-08-12 DIAGNOSIS — Z888 Allergy status to other drugs, medicaments and biological substances status: Secondary | ICD-10-CM | POA: Diagnosis not present

## 2015-08-12 DIAGNOSIS — I10 Essential (primary) hypertension: Secondary | ICD-10-CM | POA: Diagnosis not present

## 2015-08-12 DIAGNOSIS — Z9889 Other specified postprocedural states: Secondary | ICD-10-CM | POA: Diagnosis not present

## 2015-10-06 ENCOUNTER — Ambulatory Visit (INDEPENDENT_AMBULATORY_CARE_PROVIDER_SITE_OTHER): Payer: Medicare Other | Admitting: Family Medicine

## 2015-10-06 ENCOUNTER — Encounter: Payer: Self-pay | Admitting: Family Medicine

## 2015-10-06 VITALS — BP 138/80 | HR 78 | Temp 98.2°F | Ht 63.0 in | Wt 169.5 lb

## 2015-10-06 DIAGNOSIS — M6789 Other specified disorders of synovium and tendon, multiple sites: Secondary | ICD-10-CM | POA: Diagnosis not present

## 2015-10-06 DIAGNOSIS — M67431 Ganglion, right wrist: Secondary | ICD-10-CM | POA: Diagnosis not present

## 2015-10-06 DIAGNOSIS — M6790 Unspecified disorder of synovium and tendon, unspecified site: Secondary | ICD-10-CM

## 2015-10-06 NOTE — Progress Notes (Signed)
Dr. Frederico Hamman T. Derwin Reddy, MD, [facility] [place], [address] Phone: [phone] Fax: [phone]  10/06/2015  Patient: [name], MRN: [id number], DOB: May 14, 1941, 74 y.o.  Primary Physician:  Eliezer Lofts, MD   Chief Complaint  Patient presents with  . Knot on Right Wrist   Subjective:   [name] is a 74 y.o. very pleasant female patient who presents with the following:  Cyst volar - R medial. Not tender, movable, it has been there about a month.  Also a NT nodule on the 4th MCP joint, R  Past Medical History, Surgical History, Social History, Family History, Problem List, Medications, and Allergies have been reviewed and updated if relevant.  Patient Active Problem List   Diagnosis Date Noted  . Vitamin D deficiency 04/17/2015  . Counseling regarding end of life decision making 04/17/2015  . Allergic rhinitis 02/03/2015  . De Quervain's tenosynovitis, right 11/21/2013  . AORTIC STENOSIS 08/19/2010  . Osteopenia 05/11/2010  . SINUS BRADYCARDIA 08/19/2009  . Atrial fibrillation ([place]) 02/15/2008  . Anemia, iron deficiency 12/13/2007  . HYPERLIPIDEMIA 08/23/2007  . Essential hypertension, benign 08/23/2007  . UNSPECIFIED GLAUCOMA 10/10/2006    Past Medical History  Diagnosis Date  . Unspecified glaucoma   . HYPERLIPIDEMIA   . HYPERTENSION   . AORTIC STENOSIS     a. echo 11/11: EF 55-60%, mod LVH, mild AS (mean 12), mild AI, mild MR, mild LAE;  b. cath 2/09: no CAD, EF 70%  . Atrial fibrillation ([place])     On Tikosyn  . SINUS BRADYCARDIA   . OSTEOPENIA   . RA (rheumatoid arthritis) ([place])   . Pneumonia 2009  . Anemia   . Chest pain     Past Surgical History  Procedure Laterality Date  . Partial hysterectomy--1979    . Cardiac catheterization    . Thoracentesis   12/20/07    . Colonoscopy  04/18/08    Social History   Social History  . Marital Status: Widowed    Spouse Name: N/A    . Number of Children: N/A  . Years of Education: N/A   Occupational History  . Not on file.   Social History Main Topics  . Smoking status: Never Smoker   . Smokeless tobacco: Never Used  . Alcohol Use: No  . Drug Use: No  . Sexual Activity: Not on file   Other Topics Concern  . 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 Full Code ( reviewed 2015)       Occasional exercise.   Diet: fruits and veggies, lean meats.    Family History  Problem Relation Age of Onset  . Pancreatic cancer    . Diabetes    . Heart disease    . Breast cancer Paternal Aunt 60    Allergies  Allergen Reactions  . Celebrex [Celecoxib] Other (See Comments)    Tachycardia/palpitations  . Tramadol Nausea Only    Medication list reviewed and updated in full in [place].  GEN: No fevers, chills. Nontoxic. Primarily MSK c/o today.  MSK: Detailed in the HPI GI: tolerating PO intake without difficulty Neuro: No numbness, parasthesias, or tingling associated. Otherwise the pertinent positives of the ROS are noted above.   Objective:   BP 138/80 mmHg  Pulse 78  Temp(Src) 98.2 F (36.8 C) (Oral)  Ht 5\' 3"  (1.6 m)  Wt 169 lb 8 oz (76.885 kg)  BMI 30.03 kg/m2   GEN: WDWN, NAD, Non-toxic, Alert & Oriented x 3 HEENT: Atraumatic, Normocephalic.  Ears and Nose: No external deformity. EXTR: No clubbing/cyanosis/edema NEURO: Normal gait.  PSYCH: Normally interactive. Conversant. Not depressed or anxious appearing.  Calm demeanor.   Hand: R Ecchymosis or edema: neg ROM  wrist/hand/digits/elbow: full  Carpals, MCP's, digits: NT Distal Ulna and Radius: NT Supination lift test: neg Ecchymosis or edema: neg Cysts/nodules: Cyst, small and movable medial volar aspect of wrist. 4th digit near MCP, more dense nodule attached to tendon Finkelstein's test: neg Snuffbox tenderness: neg Scaphoid tubercle: NT Hook of Hamate: NT Resisted supination: NT Full composite fist Grip, all digits: 5/5 str No tenosynovitis Axial load test: neg Phalen's: neg Tinel's: neg Atrophy: neg  Hand sensation: intact   Radiology: No results found.  Assessment and Plan:   Ganglion cyst of wrist, right  Nodule of flexor tendon sheath  Reassurance. Asx.  Follow-up: No Follow-up on file.  Signed,  Maud Deed. Kerie Badger, MD   Patient's Medications  New Prescriptions   No medications on file  Previous Medications   B COMPLEX VITAMINS (VITAMIN B COMPLEX) TABS    1 tab po qd    CALCIUM CARB-CHOLECALCIFEROL (CALCIUM 1000 + D) 1000-800 MG-UNIT TABS    Take 1 tablet by mouth daily.   DABIGATRAN (PRADAXA) 150 MG CAPS    Take 150 mg by mouth every 12 (twelve) hours.   PINDOLOL (VISKEN) 5 MG TABLET    Take 1 tablet (5 mg total) by mouth 2 (two) times daily.   SPIRONOLACTONE (ALDACTONE) 25 MG TABLET    Take 1/2 tablet by mouth once daily  Modified Medications   No medications on file  Discontinued Medications   FLUTICASONE (FLONASE) 50 MCG/ACT NASAL SPRAY    Place 2 sprays into both nostrils daily.   GUAIFENESIN-CODEINE 100-10 MG/5ML SYRUP    Take 5-10 mLs by mouth at bedtime as needed for cough.   HYDROCODONE-ACETAMINOPHEN (NORCO/VICODIN) 5-325 MG PER TABLET    Take 1 tablet by mouth every 6 (six) hours as needed for moderate pain.   OLOPATADINE HCL 0.2 % SOLN    Apply to eye daily.   VITAMIN D, ERGOCALCIFEROL, (DRISDOL) 50000 UNITS CAPS CAPSULE    Take 1 capsule (50,000 Units total) by mouth every 7 (seven) days.

## 2015-10-06 NOTE — Patient Instructions (Signed)
Ganglion Cyst  A ganglion cyst is a noncancerous, fluid-filled lump that occurs near joints or tendons. The ganglion cyst grows out of a joint or the lining of a tendon. It most often develops in the hand or wrist, but it can also develop in the shoulder, elbow, hip, knee, ankle, or foot. The round or oval ganglion cyst can be the size of a pea or larger than a grape. Increased activity may enlarge the size of the cyst because more fluid starts to build up.   CAUSES  It is not known what causes a ganglion cyst to grow. However, it may be related to:  · Inflammation or irritation around the joint.  · An injury.  · Repetitive movements or overuse.  · Arthritis.  RISK FACTORS  Risk factors include:  · Being a woman.  · Being age 20-50.  SIGNS AND SYMPTOMS  Symptoms may include:   · A lump. This most often appears on the hand or wrist, but it can occur in other areas of the body.  · Tingling.  · Pain.  · Numbness.  · Muscle weakness.  · Weak grip.  · Less movement in a joint.  DIAGNOSIS  Ganglion cysts are most often diagnosed based on a physical exam. Your health care provider will feel the lump and may shine a light alongside it. If it is a ganglion cyst, a light often shines through it. Your health care provider may order an X-ray, ultrasound, or [name] to rule out other conditions.  TREATMENT  Ganglion cysts usually go away on their own without treatment. If pain or other symptoms are involved, treatment may be needed. Treatment is also needed if the ganglion cyst limits your movement or if it gets infected. Treatment may include:  · Wearing a brace or splint on your wrist or finger.  · Taking anti-inflammatory medicine.  · Draining fluid from the lump with a needle (aspiration).  · Injecting a steroid into the joint.  · Surgery to remove the ganglion cyst.  HOME CARE INSTRUCTIONS  · Do not press on the ganglion cyst, poke it with a needle, or hit it.  · Take medicines only as directed by your health care  provider.  · Wear your brace or splint as directed by your health care provider.  · Watch your ganglion cyst for any changes.  · Keep all follow-up visits as directed by your health care provider. This is important.  SEEK MEDICAL CARE IF:  · Your ganglion cyst becomes larger or more painful.  · You have increased redness, red streaks, or swelling.  · You have pus coming from the lump.  · You have weakness or numbness in the affected area.  · You have a fever or chills.     This information is not intended to replace advice given to you by your health care provider. Make sure you discuss any questions you have with your health care provider.     Document Released: 09/23/2000 Document Revised: 10/17/2014 Document Reviewed: 03/11/2014  Elsevier Interactive Patient Education ©2016 [employer] Inc[employer]

## 2015-10-06 NOTE — Progress Notes (Signed)
Pre visit review using our clinic review tool, if applicable. No additional management support is needed unless otherwise documented below in the visit note. 

## 2015-11-16 ENCOUNTER — Telehealth: Payer: Self-pay | Admitting: Family Medicine

## 2015-11-16 DIAGNOSIS — I480 Paroxysmal atrial fibrillation: Secondary | ICD-10-CM

## 2015-11-16 NOTE — Telephone Encounter (Signed)
Pt would like referral for cardiology cb number is [phone] Thank you

## 2015-11-16 NOTE — Telephone Encounter (Signed)
Order complete. 

## 2015-11-26 ENCOUNTER — Emergency Department: Payer: Medicare Other

## 2015-11-26 ENCOUNTER — Emergency Department
Admission: EM | Admit: 2015-11-26 | Discharge: 2015-11-26 | Disposition: A | Discharge status: Home / Self Care | Payer: Medicare Other | Attending: Emergency Medicine | Admitting: Emergency Medicine

## 2015-11-26 ENCOUNTER — Telehealth: Payer: Self-pay | Admitting: Family Medicine

## 2015-11-26 ENCOUNTER — Encounter: Payer: Self-pay | Admitting: Emergency Medicine

## 2015-11-26 DIAGNOSIS — Z79899 Other long term (current) drug therapy: Secondary | ICD-10-CM | POA: Insufficient documentation

## 2015-11-26 DIAGNOSIS — R079 Chest pain, unspecified: Secondary | ICD-10-CM | POA: Diagnosis not present

## 2015-11-26 DIAGNOSIS — I4891 Unspecified atrial fibrillation: Secondary | ICD-10-CM | POA: Insufficient documentation

## 2015-11-26 DIAGNOSIS — R51 Headache: Secondary | ICD-10-CM | POA: Diagnosis not present

## 2015-11-26 DIAGNOSIS — I10 Essential (primary) hypertension: Secondary | ICD-10-CM | POA: Diagnosis not present

## 2015-11-26 LAB — CBC
HCT: 36.9 % (ref 35.0–47.0)
HEMOGLOBIN: 12.4 g/dL (ref 12.0–16.0)
MCH: 30.2 pg (ref 26.0–34.0)
MCHC: 33.6 g/dL (ref 32.0–36.0)
MCV: 89.9 fL (ref 80.0–100.0)
Platelets: 205 10*3/uL (ref 150–440)
RBC: 4.1 MIL/uL (ref 3.80–5.20)
RDW: 13.6 % (ref 11.5–14.5)
WBC: 5.4 10*3/uL (ref 3.6–11.0)

## 2015-11-26 LAB — BASIC METABOLIC PANEL
ANION GAP: 6 (ref 5–15)
BUN: 19 mg/dL (ref 6–20)
CALCIUM: 9.6 mg/dL (ref 8.9–10.3)
CHLORIDE: 107 mmol/L (ref 101–111)
CO2: 27 mmol/L (ref 22–32)
Creatinine, Ser: 1.09 mg/dL — ABNORMAL HIGH (ref 0.44–1.00)
GFR calc non Af Amer: 49 mL/min — ABNORMAL LOW (ref >60)
GFR, EST AFRICAN AMERICAN: 57 mL/min — AB (ref >60)
Glucose, Bld: 102 mg/dL — ABNORMAL HIGH (ref 65–99)
POTASSIUM: 4.2 mmol/L (ref 3.5–5.1)
Sodium: 140 mmol/L (ref 135–145)

## 2015-11-26 LAB — TROPONIN I

## 2015-11-26 IMAGING — CT CT HEAD W/O CM
1 series · 16 of 30 positions shown, 20 images · non-contrast
Comparison: None.

CLINICAL DATA: Headaches.

EXAM:
CT HEAD WITHOUT CONTRAST
TECHNIQUE: Contiguous axial images were obtained from the base of the skull
through the vertex without intravenous contrast.

[Series 2: head wo · axial · 0.41mm/px · z∈[+545,+689]mm · 16 of 36 slices shown, 20 images]
[im 2/36  brain]
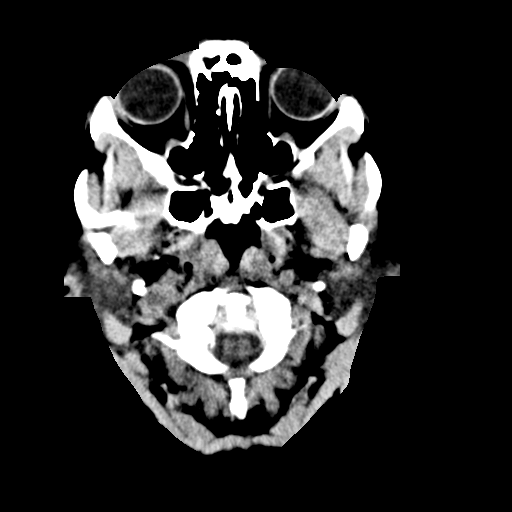
[im 2/36  bone]
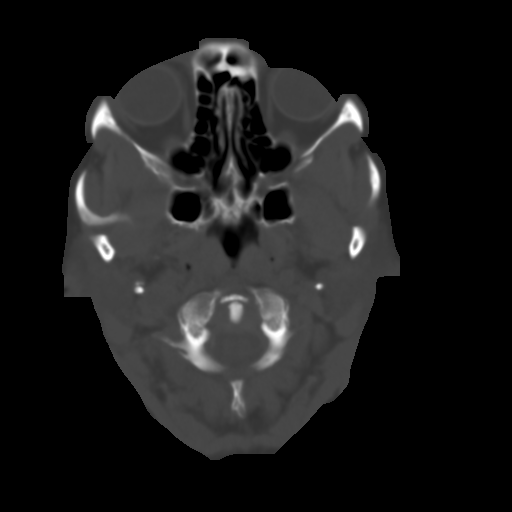
[im 4/36  brain]
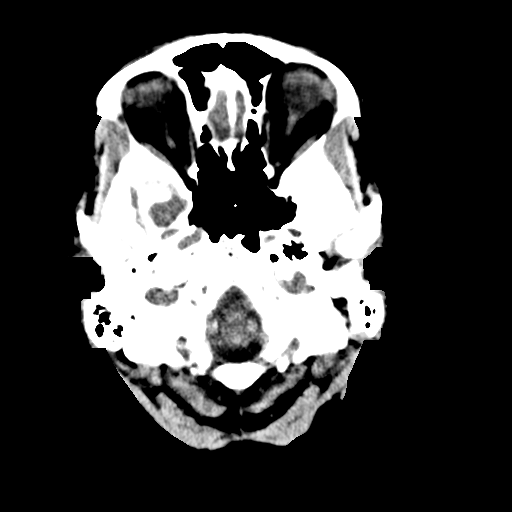
[im 7/36  brain]
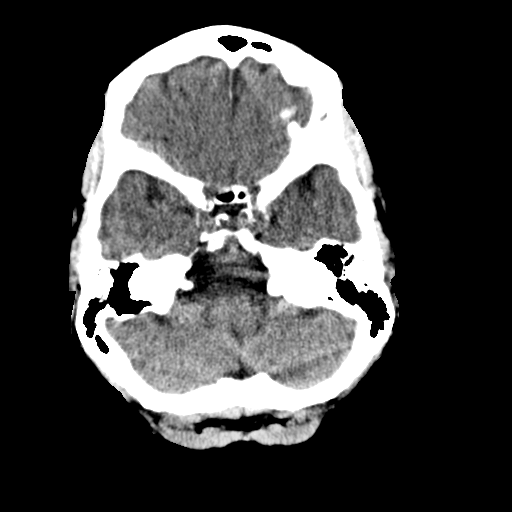
[im 9/36  brain]
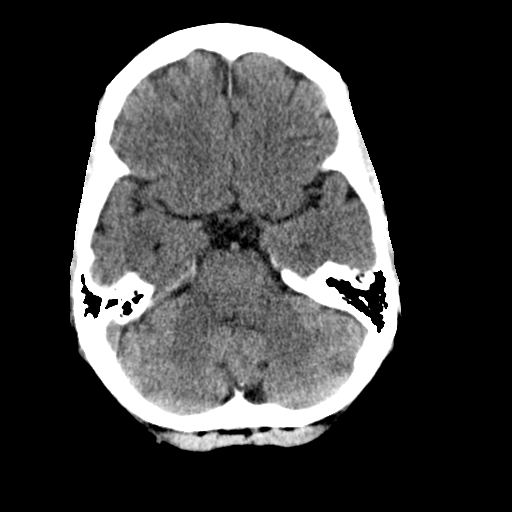
[im 10/36  brain]
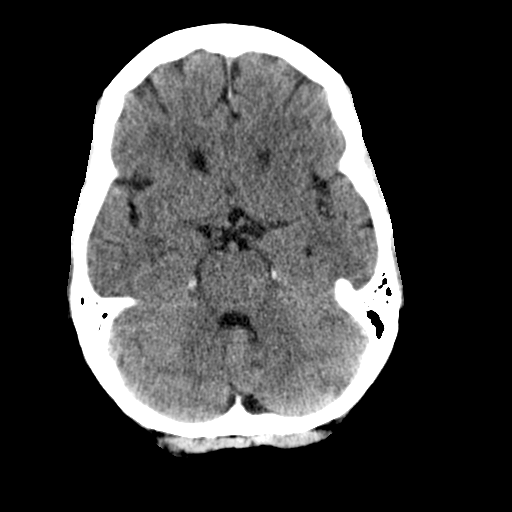
[im 10/36  bone]
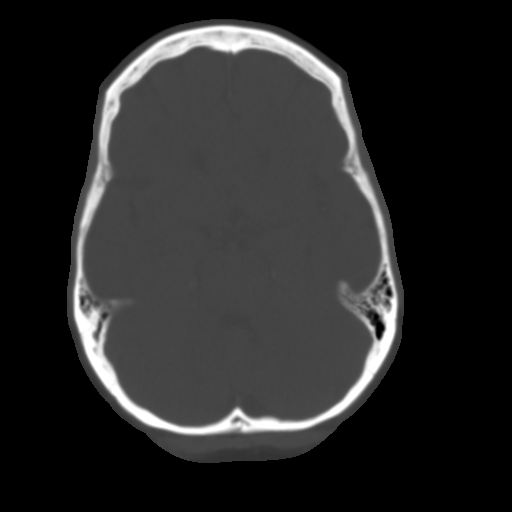
[im 13/36  brain]
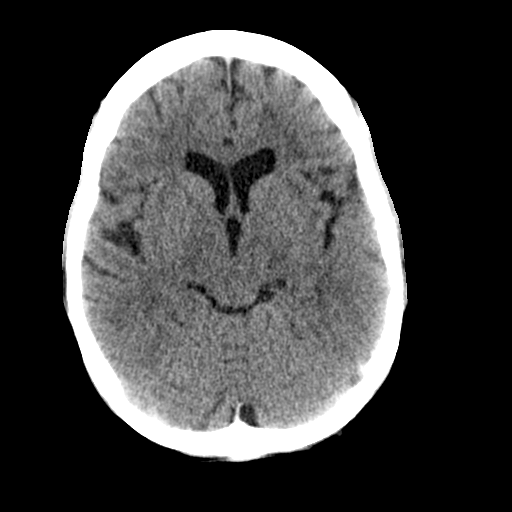
[im 15/36  brain]
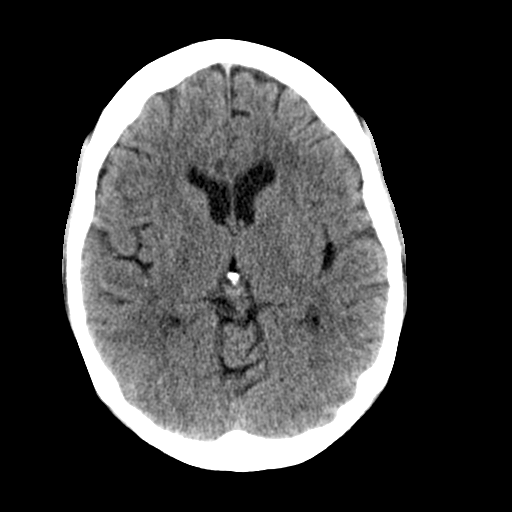
[im 17/36  brain]
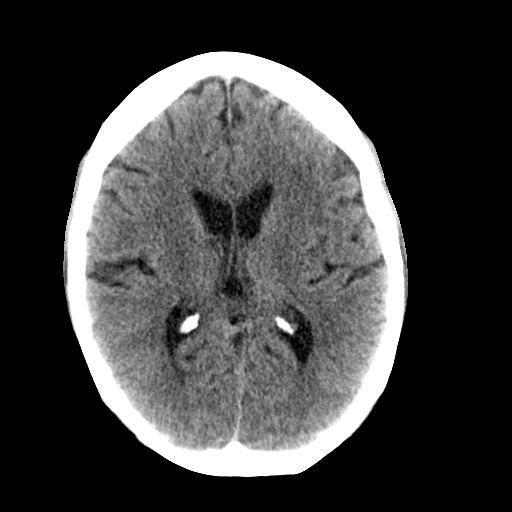
[im 19/36  brain]
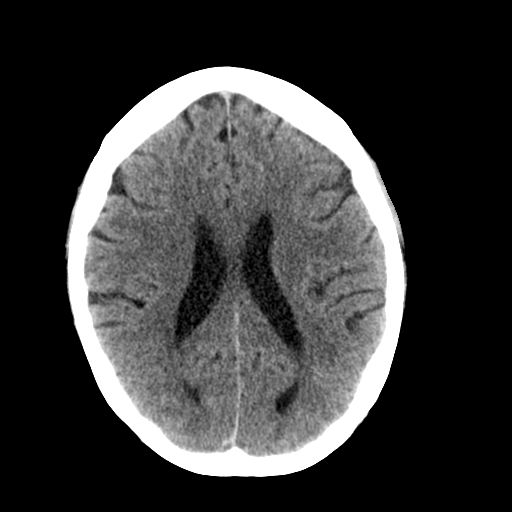
[im 19/36  bone]
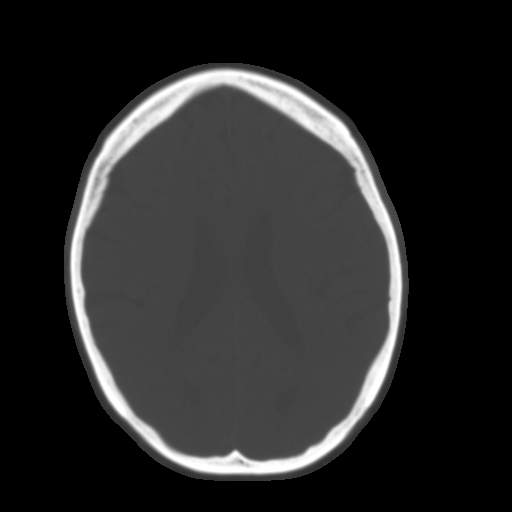
[im 21/36  brain]
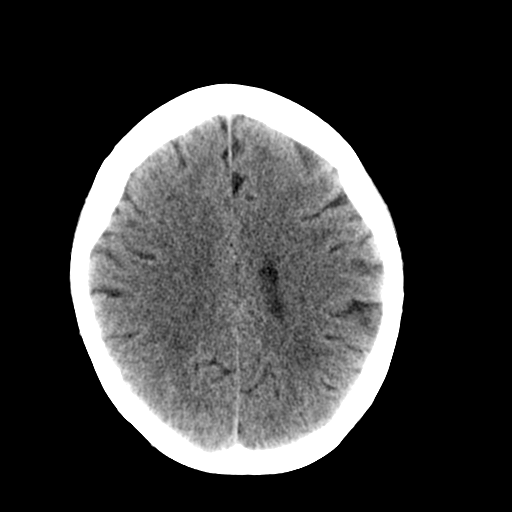
[im 23/36  brain]
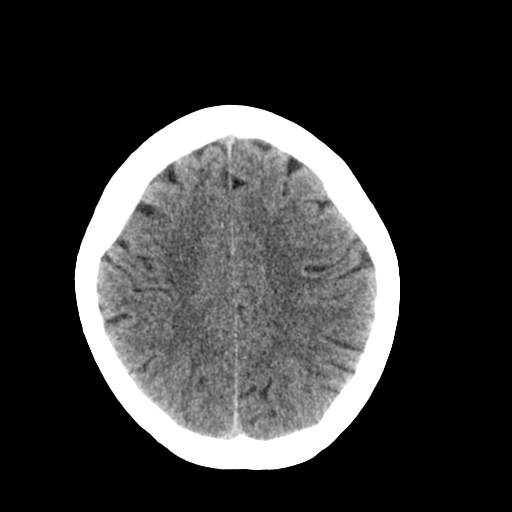
[im 26/36  brain]
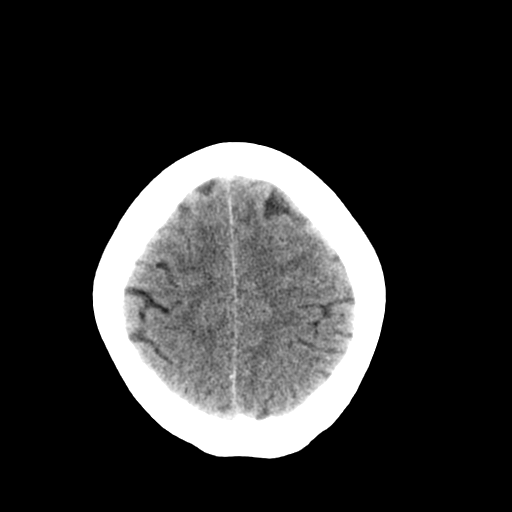
[im 27/36  brain]
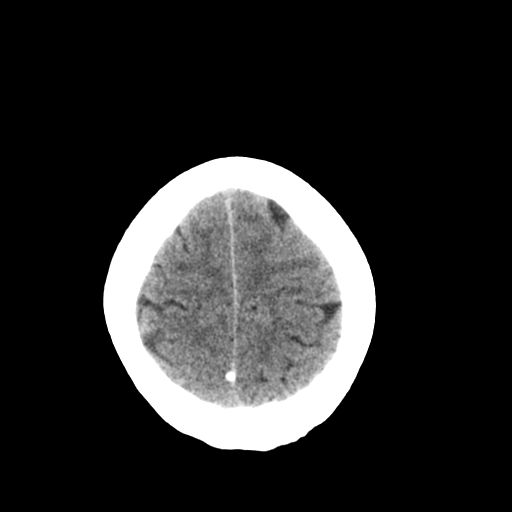
[im 27/36  bone]
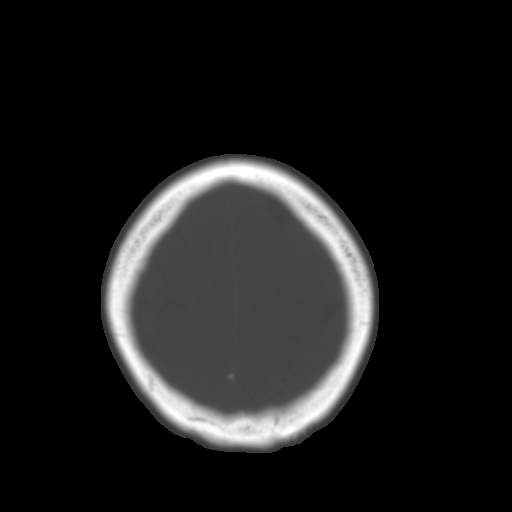
[im 29/36  brain]
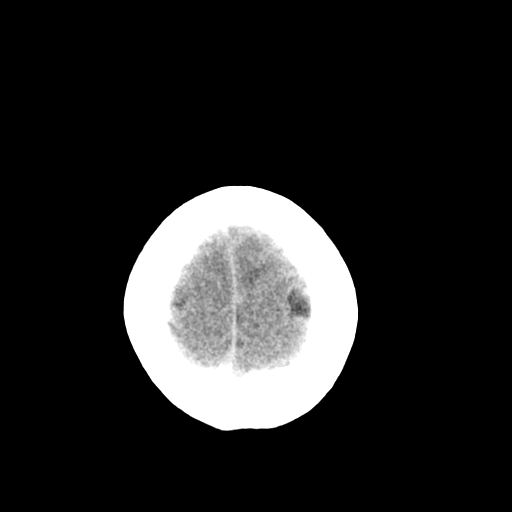
[im 32/36  brain]
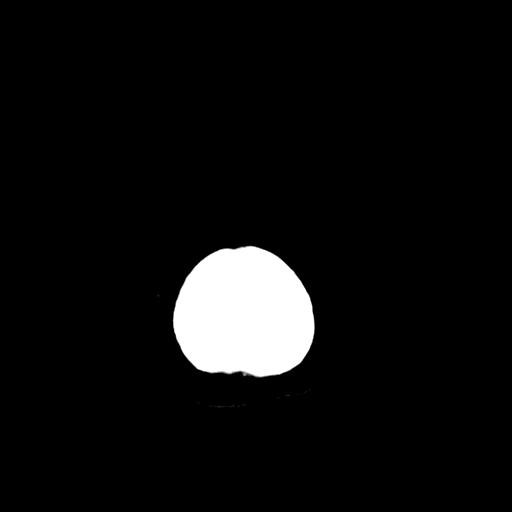
[im 34/36  brain]
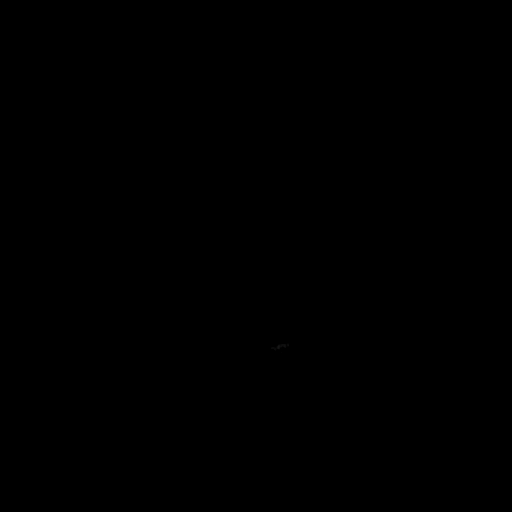

[16 of 30 positions shown; findings below may reference images not displayed]

FINDINGS: Bony calvarium appears intact. Minimal chronic ischemic white matter
disease is noted. No mass effect or midline shift is noted.
Ventricular size is within normal limits. There is no evidence of
mass lesion, hemorrhage or acute infarction.
IMPRESSION: Minimal chronic ischemic white matter disease. No acute intracranial
abnormality seen.

## 2015-11-26 IMAGING — CR DG CHEST 2V
2 series · 2 of 2 positions shown · non-contrast
Comparison: [DATE]

CLINICAL DATA: Chest pain since last night. Focuses on left side of
chest. Pt off a-fib meds x 2 months. Hx pneumonia [M3]. Nonsmoker.

EXAM:
CHEST  2 VIEW

[chest pa]
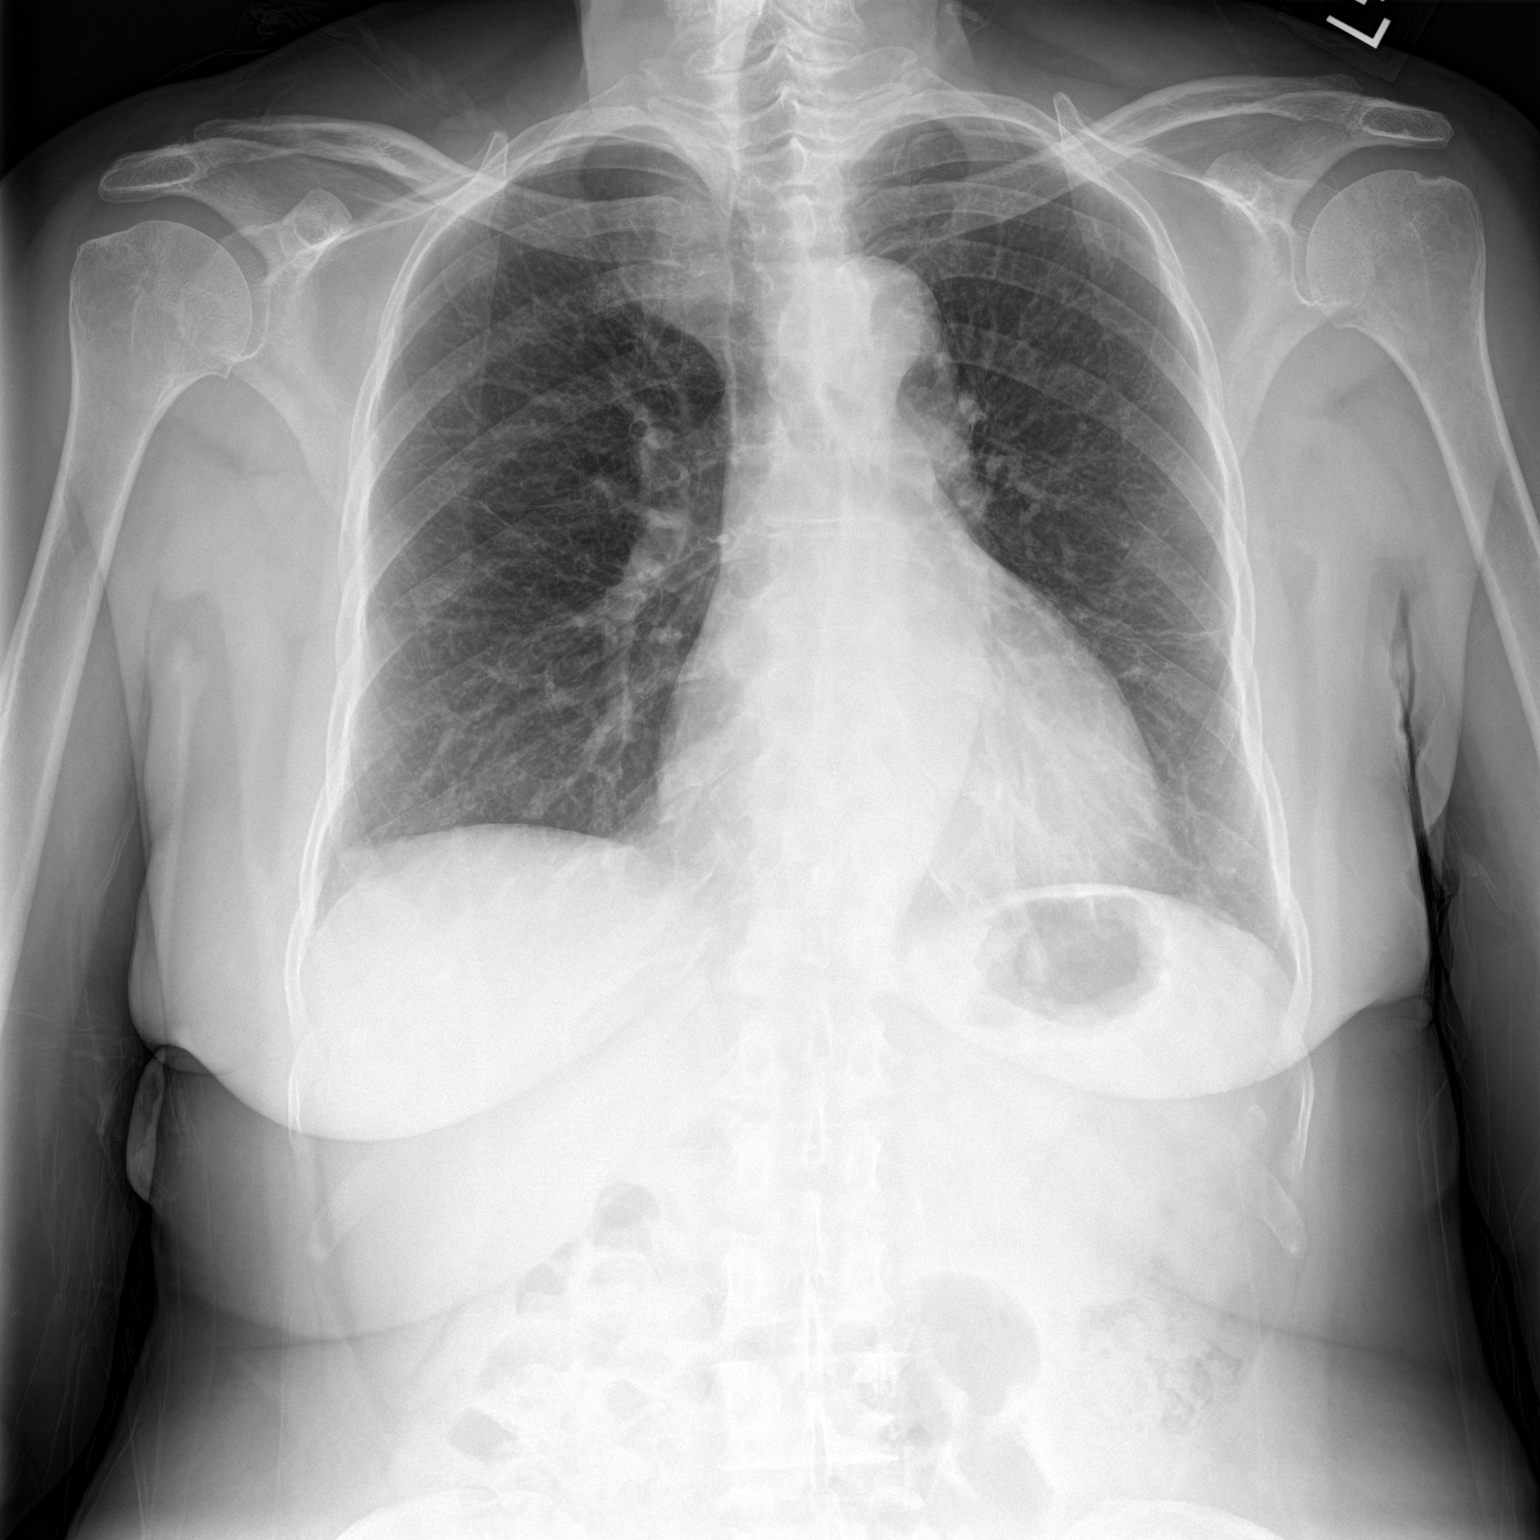

[chest lat]
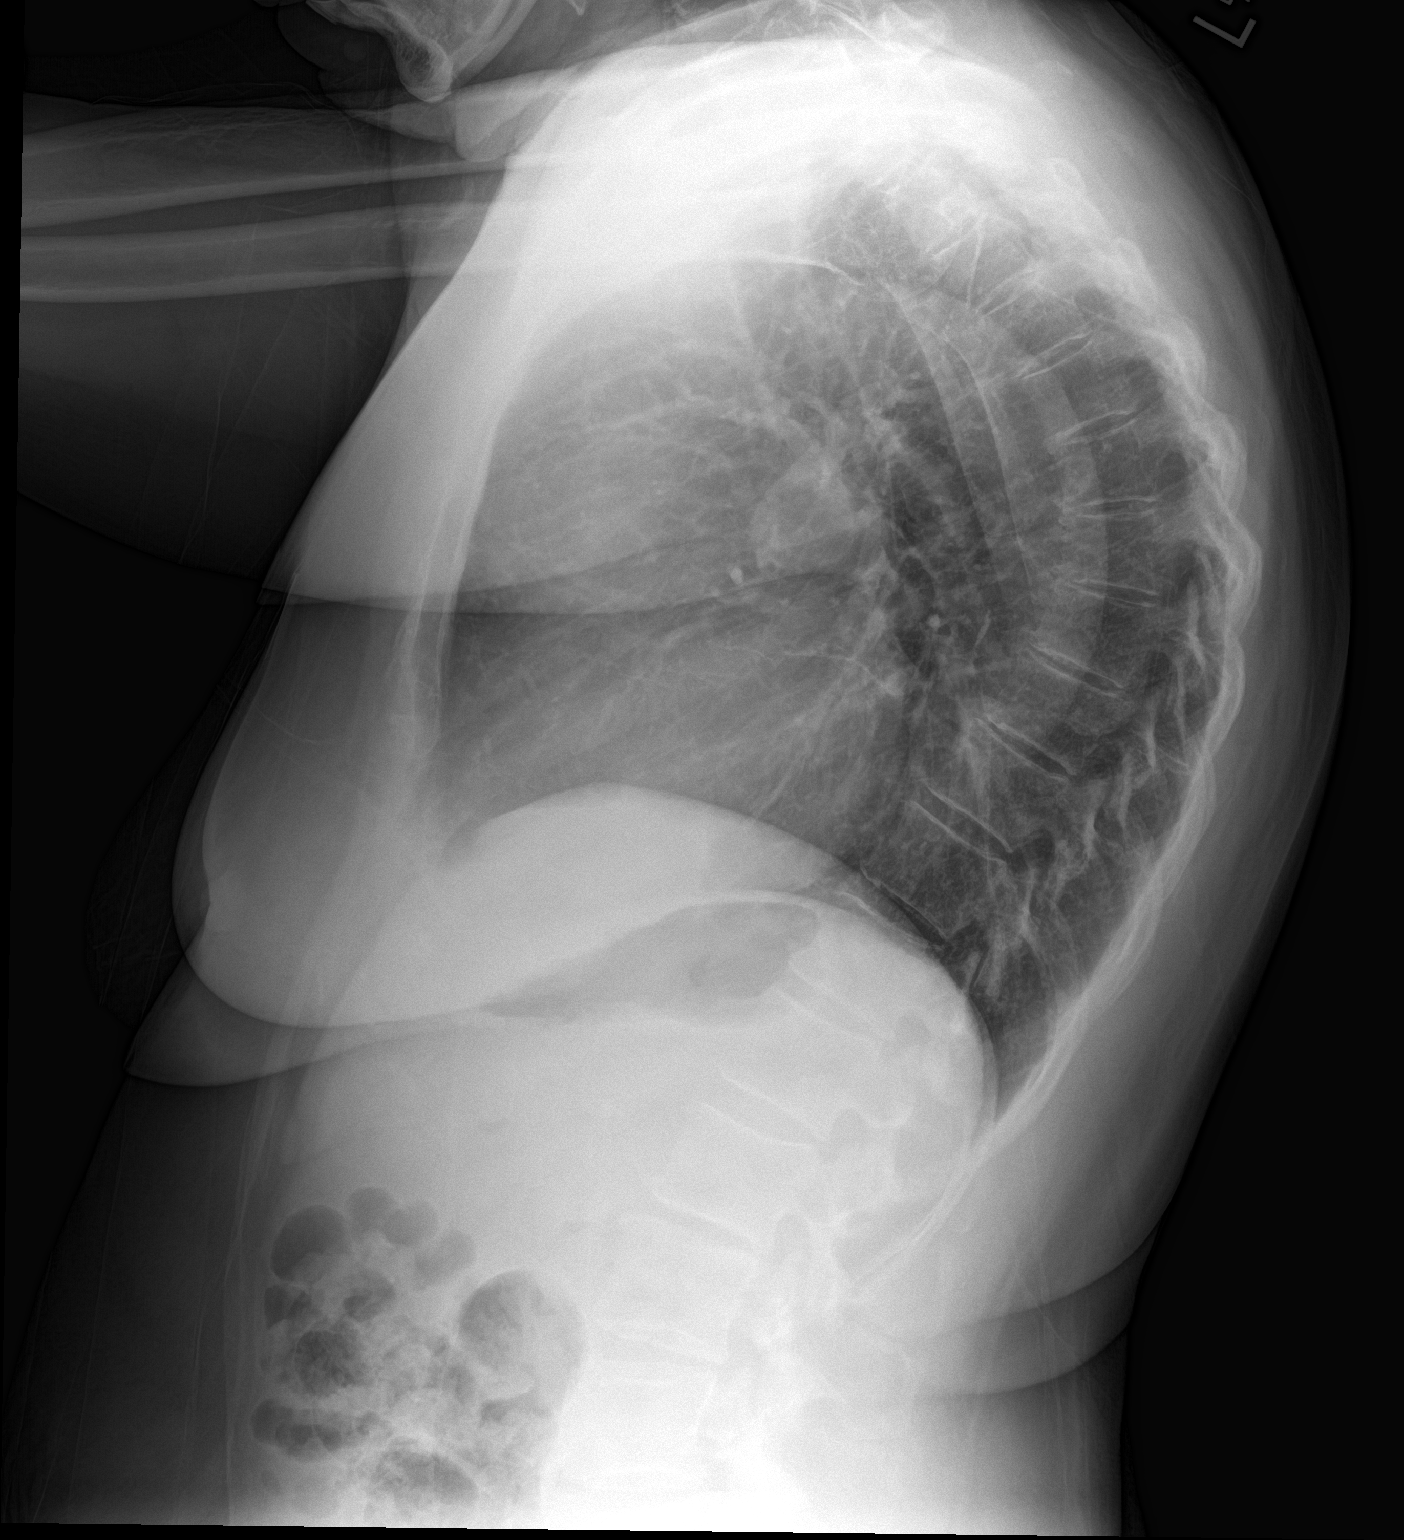

[2 of 2 positions shown; findings below may reference images not displayed]

FINDINGS: Cardiac silhouette is mildly enlarged. The aorta is uncoiled. No
mediastinal or hilar masses or evidence of adenopathy.

Minor linear scarring in the left upper lobe. Lungs otherwise clear.
No pleural effusion or pneumothorax.

Bony thorax is intact.
IMPRESSION: No acute cardiopulmonary disease.

## 2015-11-26 MED ORDER — PINDOLOL 5 MG PO TABS: 5.0000 mg | ORAL_TABLET | Freq: Two times a day (BID) | ORAL | Status: DC

## 2015-11-26 MED ORDER — SPIRONOLACTONE 25 MG PO TABS: 25.0000 mg | ORAL_TABLET | Freq: Every day | ORAL | Status: DC

## 2015-11-26 MED ORDER — CLONIDINE HCL 0.1 MG PO TABS
0.1000 mg | ORAL_TABLET | Freq: Once | ORAL | Status: AC
  Administered 2015-11-26: 0.1 mg via ORAL
  Filled 2015-11-26: qty 1

## 2015-11-26 NOTE — ED Provider Notes (Signed)
Time Seen: Approximately 3:30  I have reviewed the triage notes  Chief Complaint: Chest Pain   History of Present Illness: [name] is a 75 y.o. female *who presents with some feelings of generalized malaise. She states she has a headache describes it as being frontal and radiates to the back of her head. She is also noticed some left-sided chest discomfort and points to have "" sore spot "" just above her left breast. She states she has had mammography which is been negative. Her headache does not seem to cause any nausea, vomiting, weakness, neck pain. She denies any history of similar headaches but states it's only a 3 out of 10 in its severity. She denies any fever at home.*She denies any photophobia or focal weakness. She denies any arm, jaw pain, shortness of breath. Patient states that she's been off her blood pressure medications now approximately a month. She has had similar symptoms in the past with her hypertension. Past Medical History  Diagnosis Date  . Unspecified glaucoma   . HYPERLIPIDEMIA   . HYPERTENSION   . AORTIC STENOSIS     a. echo 11/11: EF 55-60%, mod LVH, mild AS (mean 12), mild AI, mild MR, mild LAE;  b. cath 2/09: no CAD, EF 70%  . Atrial fibrillation ([place])     On Tikosyn  . SINUS BRADYCARDIA   . OSTEOPENIA   . RA (rheumatoid arthritis) ([place])   . Pneumonia 2009  . Anemia   . Chest pain     Patient Active Problem List   Diagnosis Date Noted  . Vitamin D deficiency 04/17/2015  . Counseling regarding end of life decision making 04/17/2015  . Allergic rhinitis 02/03/2015  . De Quervain's tenosynovitis, right 11/21/2013  . AORTIC STENOSIS 08/19/2010  . Osteopenia 05/11/2010  . SINUS BRADYCARDIA 08/19/2009  . Atrial fibrillation ([place]) 02/15/2008  . Anemia, iron deficiency 12/13/2007  . HYPERLIPIDEMIA 08/23/2007  . Essential hypertension, benign 08/23/2007  . UNSPECIFIED GLAUCOMA 10/10/2006    Past Surgical History  Procedure Laterality Date   . Partial hysterectomy--1979    . Cardiac catheterization    . Thoracentesis   12/20/07    . Colonoscopy  04/18/08    Past Surgical History  Procedure Laterality Date  . Partial hysterectomy--1979    . Cardiac catheterization    . Thoracentesis   12/20/07    . Colonoscopy  04/18/08    Current Outpatient Rx  Name  Route  Sig  Dispense  Refill  . B Complex Vitamins (VITAMIN B COMPLEX) TABS      1 tab po qd          . Calcium Carb-Cholecalciferol (CALCIUM 1000 + D) 1000-800 MG-UNIT TABS   Oral   Take 1 tablet by mouth daily.         . dabigatran (PRADAXA) 150 MG CAPS   Oral   Take 150 mg by mouth every 12 (twelve) hours.         . pindolol (VISKEN) 5 MG tablet   Oral   Take 1 tablet (5 mg total) by mouth 2 (two) times daily.   30 tablet   0   . spironolactone (ALDACTONE) 25 MG tablet   Oral   Take 1 tablet (25 mg total) by mouth daily.   30 tablet   0     Allergies:  Celebrex and Tramadol  Family History: Family History  Problem Relation Age of Onset  . Pancreatic cancer    . Diabetes    .  Heart disease    . Breast cancer Paternal Aunt 45    Social History: Social History  Substance Use Topics  . Smoking status: Never Smoker   . Smokeless tobacco: Never Used  . Alcohol Use: No     Review of Systems:   10 point review of systems was performed and was otherwise negative:  Constitutional: No fever Eyes: No visual disturbances ENT: No sore throat, ear pain Cardiac: No chest pain Respiratory: No shortness of breath, wheezing, or stridor Abdomen: No abdominal pain, no vomiting, No diarrhea Endocrine: No weight loss, No night sweats Extremities: No peripheral edema, cyanosis Skin: No rashes, easy bruising Neurologic: No focal weakness, trouble with speech or swollowing Urologic: No dysuria, Hematuria, or urinary frequency   Physical Exam:  ED Triage Vitals  Enc Vitals Group     BP 11/26/15 1259 217/79 mmHg     Pulse Rate 11/26/15 1259 68      Resp 11/26/15 1259 20     Temp 11/26/15 1259 98.1 F (36.7 C)     Temp Source 11/26/15 1259 Oral     SpO2 11/26/15 1259 98 %     Weight 11/26/15 1259 167 lb (75.751 kg)     Height 11/26/15 1259 5\' 3"  (1.6 m)     Head Cir --      Peak Flow --      Pain Score 11/26/15 1300 3     Pain Loc --      Pain Edu? --      Excl. in [place]? --     General: Awake , Alert , and Oriented times 3; GCS 15 Head: Normal cephalic , atraumatic Eyes: Pupils equal , round, reactive to light Nose/Throat: No nasal drainage, patent upper airway without erythema or exudate.  Neck: Supple, Full range of motion, No anterior adenopathy or palpable thyroid masses Lungs: Clear to ascultation without wheezes , rhonchi, or rales Heart: Regular rate, regular rhythm without murmurs , gallops , or rubs Abdomen: Soft, non tender without rebound, guarding , or rigidity; bowel sounds positive and symmetric in all 4 quadrants. No organomegaly .        Extremities: 2 plus symmetric pulses. No edema, clubbing or cyanosis Neurologic: normal ambulation, Motor symmetric without deficits, sensory intact Skin: warm, dry, no rashes The patient has reproducible chest wall pain with palpation above the left breast with no palpable masses or lesions.  Labs:   All laboratory work was reviewed including any pertinent negatives or positives listed below:  Labs Reviewed  BASIC METABOLIC PANEL - Abnormal; Notable for the following:    Glucose, Bld 102 (*)    Creatinine, Ser 1.09 (*)    GFR calc non Af Amer 49 (*)    GFR calc Af Amer 57 (*)    All other components within normal limits  CBC  TROPONIN I   Review of laboratory work shows no significant findings EKG:  ED ECG REPORT I, Daymon Larsen, the attending physician, personally viewed and interpreted this ECG.  Date: 11/26/2015 EKG Time: 1252 Rate: 62 Rhythm: normal sinus rhythm QRS Axis: normal Intervals: normal ST/T Wave abnormalities: Nonspecific ST and T wave  abnormality Conduction Disturbances: none Narrative Interpretation: unremarkable    Radiology:   Narrative:    CLINICAL DATA: Headaches.  EXAM: CT HEAD WITHOUT CONTRAST  TECHNIQUE: Contiguous axial images were obtained from the base of the skull through the vertex without intravenous contrast.  COMPARISON: None.  FINDINGS: Bony calvarium appears intact. Minimal chronic  ischemic white matter disease is noted. No mass effect or midline shift is noted. Ventricular size is within normal limits. There is no evidence of mass lesion, hemorrhage or acute infarction.  IMPRESSION: Minimal chronic ischemic white matter disease. No acute intracranial abnormality seen.        I personally reviewed the radiologic studies chest. Pt off a-fib meds x 2 months. Hx pneumonia 2009. Nonsmoker.  EXAM: CHEST 2 VIEW  COMPARISON: 02/07/2008  FINDINGS: Cardiac silhouette is mildly enlarged. The aorta is uncoiled. No mediastinal or hilar masses or evidence of adenopathy.  Minor linear scarring in the left upper lobe. Lungs otherwise clear. No pleural effusion or pneumothorax.  Bony thorax is intact.  IMPRESSION: No acute cardiopulmonary disease.   ED Course Patient's stay here was uneventful and her blood pressure seems to be somewhat labile in nature. Patient was given a clonidine here in emergency department. She states this headache is somewhat unusual and I was concerned about intracranial masses, not so much intracerebral hemorrhage or subarachnoid hemorrhage. I felt at this time the patient does not require lumbar puncture. The patient again just has a mild headache and her left-sided chest pain seems to be very reproducible on physical exam. I felt this was unlikely to be a life-threatening cause for headache such as acute coronary syndrome. The patient states that she feels she will do well with a prescription for her blood pressure medication.    Assessment:   Essential hypertension Musculoskeletal chest pain   Final Clinical Impression:  Final diagnoses:  Essential hypertension     Plan: Outpatient management Patient was advised to return immediately if condition worsens. Patient was advised to follow up with their primary care physician or other specialized physicians involved in their outpatient care             Daymon Larsen, MD 11/26/15 1556

## 2015-11-26 NOTE — Telephone Encounter (Signed)
Patient Name: LEELOO [facility]  DOB: [birth year]    Initial Comment caller states mother has A fib   Nurse Assessment  Nurse: Raphael Gibney, RN, [place] Date/Time (Eastern Time): 11/26/2015 11:56:58 AM  Confirm and document reason for call. If symptomatic, describe symptoms. You must click the next button to save text entered. ---Caller states mother was seeing a cardiologist. Has had racing heart the past 2 nights. She has run out of her heart medication. History of atrial fib. Heart is not racing right now. the racing heart is waking her up. No chest pain or SOB. She has a bad headache. Not dizzy. She has been having chest pain since last night.  Has the patient traveled out of the country within the last 30 days? ---Not Applicable  Does the patient have any new or worsening symptoms? ---Yes  Will a triage be completed? ---Yes  Related visit to physician within the last 2 weeks? ---No  Does the PT have any chronic conditions? (i.e. diabetes, asthma, etc.) ---Yes  List chronic conditions. ---atrial fib; HTN  Is this a behavioral health or substance abuse call? ---No     Guidelines    Guideline Title Affirmed Question Affirmed Notes  Chest Pain [1] Chest pain lasts > 5 minutes AND [2] age > 69    Final Disposition User   Call EMS 911 Now Raphael Gibney, RN, [place] states she is not going to call 911 but her daughter states she will take her to the ER.   Referrals  [facility] - ED   Disagree/Comply: Disagree  Disagree/Comply Reason: Disagree with instructions

## 2015-11-26 NOTE — ED Notes (Signed)
Patient transported to CT 

## 2015-11-26 NOTE — ED Notes (Signed)
Patient presents to the ED with left sided chest pain that patient describes as tenderness.  Patient reports intermittent chest pain for about 2 weeks.  Patient states her pcp refused to refill her blood pressure medications about 1 month ago and patient states for the past 2-3 weeks patient has been having headaches and during the night feeling a "whooshing sound in her ears" and breaking out in a sweat.

## 2015-12-02 ENCOUNTER — Encounter: Payer: Self-pay | Admitting: Cardiology

## 2015-12-02 ENCOUNTER — Ambulatory Visit (INDEPENDENT_AMBULATORY_CARE_PROVIDER_SITE_OTHER): Payer: Medicare Other | Admitting: Cardiology

## 2015-12-02 VITALS — BP 126/100 | HR 56 | Resp 16 | Ht 63.0 in | Wt 165.8 lb

## 2015-12-02 DIAGNOSIS — I350 Nonrheumatic aortic (valve) stenosis: Secondary | ICD-10-CM

## 2015-12-02 DIAGNOSIS — I4891 Unspecified atrial fibrillation: Secondary | ICD-10-CM

## 2015-12-02 DIAGNOSIS — E782 Mixed hyperlipidemia: Secondary | ICD-10-CM | POA: Diagnosis not present

## 2015-12-02 DIAGNOSIS — I340 Nonrheumatic mitral (valve) insufficiency: Secondary | ICD-10-CM

## 2015-12-02 DIAGNOSIS — I10 Essential (primary) hypertension: Secondary | ICD-10-CM

## 2015-12-02 DIAGNOSIS — E669 Obesity, unspecified: Secondary | ICD-10-CM | POA: Insufficient documentation

## 2015-12-02 DIAGNOSIS — I351 Nonrheumatic aortic (valve) insufficiency: Secondary | ICD-10-CM | POA: Insufficient documentation

## 2015-12-02 NOTE — Patient Instructions (Signed)
Medication Instructions:  Your physician recommends that you continue on your current medications as directed. Please refer to the Current Medication list given to you today.   Labwork: None ordered  Testing/Procedures: Your physician has requested that you have an echocardiogram. Echocardiography is a painless test that uses sound waves to create images of your heart. It provides your doctor with information about the size and shape of your heart and how well your heart's chambers and valves are working. This procedure takes approximately one hour. There are no restrictions for this procedure.    Follow-Up: Your physician recommends that you schedule a follow-up appointment in: 1 month with Dr. Yvone Neu   Any Other Special Instructions Will Be Listed Below (If Applicable).  Your physician has requested that you regularly monitor and record your blood pressure readings at home. Please use the same machine at the same time of day to check your readings and record them to bring to your follow-up visit.

## 2015-12-02 NOTE — Progress Notes (Signed)
Cardiology Office Note   Date:  12/02/2015   ID:  [name], DOB 09/10/1941, MRN [id number]  Referring Doctor:  Eliezer Lofts, MD   Cardiologist:   Wende Bushy, MD   Reason for consultation:  Chief Complaint  Patient presents with  . Hypertension    referred to establish care with general cardiologist, for HTN      History of Present Illness: [name] is a 75 y.o. female who presents for management of hypertension.  She was last seen by Dr. Caryl Comes for atrial fibrillation in 2012. Since then, she moved to [place],  [place] and started seeing Dr. Lenord Fellers, [facility]. She had symptomatic atrial fibrillation, despite being on an antiarrhythmic medication. She then underwent atrial fibrillation ablation in 2013.   Since then she had been doing fairly well. She denies chest pain, shortness of breath, palpitations, loss of consciousness, dizziness, bleeding.   ROS:  Please see the history of present illness. Aside from mentioned under HPI, all other systems are reviewed and negative.      Past Medical History  Diagnosis Date  . Unspecified glaucoma   . HYPERLIPIDEMIA   . HYPERTENSION   . AORTIC STENOSIS     a. echo 11/11: EF 55-60%, mod LVH, mild AS (mean 12), mild AI, mild MR, mild LAE;  b. cath 2/09: no CAD, EF 70%  . Atrial fibrillation ([place])     On Tikosyn  . SINUS BRADYCARDIA   . OSTEOPENIA   . RA (rheumatoid arthritis) ([place])   . Pneumonia 2009  . Anemia   . Chest pain     Past Surgical History  Procedure Laterality Date  . Partial hysterectomy--1979    . Cardiac catheterization    . Thoracentesis   12/20/07    . Colonoscopy  04/18/08  . Ablation of dysrhythmic focus     Atrial fibrillation ablation 2013   reports that she has never smoked. She has never used smokeless tobacco. She reports that she does not drink alcohol or use illicit drugs.   family history includes Atrial fibrillation in her brother; Breast cancer (age  of onset: 30) in her paternal aunt; Heart disease in her mother; Pancreatic cancer in her father.   Current Outpatient Prescriptions  Medication Sig Dispense Refill  . B Complex Vitamins (VITAMIN B COMPLEX) TABS Take 1 tablet by mouth daily.     . Calcium Carb-Cholecalciferol (CALCIUM 1000 + D) 1000-800 MG-UNIT TABS Take 1 tablet by mouth daily.    . dabigatran (PRADAXA) 150 MG CAPS Take 150 mg by mouth every 12 (twelve) hours.    . pindolol (VISKEN) 5 MG tablet Take 1 tablet (5 mg total) by mouth 2 (two) times daily. 30 tablet 0  . spironolactone (ALDACTONE) 25 MG tablet Take 1 tablet (25 mg total) by mouth daily. 30 tablet 0   No current facility-administered medications for this visit.    Allergies: Celebrex and Tramadol    PHYSICAL EXAM: VS:  BP 126/100 mmHg  Pulse 56  Resp 16  Ht 5\' 3"  (1.6 m)  Wt 165 lb 12.8 oz (75.206 kg)  BMI 29.38 kg/m2  SpO2 99% , Body mass index is 29.38 kg/(m^2). patient has not taken her antihypertensive medications yet  Wt Readings from Last 3 Encounters:  12/02/15 165 lb 12.8 oz (75.206 kg)  11/26/15 167 lb (75.751 kg)  10/06/15 169 lb 8 oz (76.885 kg)    GENERAL:  well developed, well nourished, obese, not in acute  distress HEENT: normocephalic, pink conjunctivae, anicteric sclerae, no xanthelasma, normal dentition, oropharynx clear NECK:  no neck vein engorgement, JVP normal, no hepatojugular reflux, carotid upstroke brisk and symmetric, no bruit, no thyromegaly, no lymphadenopathy LUNGS:  good respiratory effort, clear to auscultation bilaterally CV:  PMI not displaced, no thrills, no lifts, S1 and S2 within normal limits, no palpable S3 or S4, no rubs, no gallops, 3 out of 6 soft systolic ejection murmur consistent with aortic stenosis, nonradiating, no delay in carotid upstroke or radial pulse ABD:  Soft, nontender, nondistended, normoactive bowel sounds, no abdominal aortic bruit, no hepatomegaly, no splenomegaly MS: nontender back, no  kyphosis, no scoliosis, no joint deformities EXT:  2+ DP/PT pulses, no edema, no varicosities, no cyanosis, no clubbing, spider veins noted SKIN: warm, nondiaphoretic, normal turgor, no ulcers NEUROPSYCH: alert, oriented to person, place, and time, sensory/motor grossly intact, normal mood, appropriate affect  Recent Labs: 04/10/2015: ALT 14 11/26/2015: BUN 19; Creatinine, Ser 1.09*; Hemoglobin 12.4; Platelets 205; Potassium 4.2; Sodium 140   Lipid Panel    Component Value Date/Time   CHOL 196 04/10/2015 0906   TRIG 55.0 04/10/2015 0906   HDL 67.00 04/10/2015 0906   CHOLHDL 3 04/10/2015 0906   VLDL 11.0 04/10/2015 0906   LDLCALC 118* 04/10/2015 0906   LDLDIRECT 104.4 11/26/2012 0746     Other studies Reviewed:  EKG:  EKG is ordered today. The ekg ordered today was personally reviewed by me and it reveals sinus bradycardia 56 BPM,minimal ST depression  inferior and lateral leads. Findings are similar to EKG from 09/23/2014.  EKG from 11/26/2015 12:52 pm showed sinus rhythm 62 bpm, nonspecific ST and T-wave abnormality.  Additional studies/ records that were reviewed personally by me today include:  Echo: 08/30/2010: Moderate LVH, EF 55-60%. Mild aortic stenosis. Mild aortic regurgitation. PHT 586 ms. Velocity ratio VTI of 0.62. Mean gradient 12. Peak gradient 25.Mild MR. Mild LA dilatation. Mild TR. Cath: February 2009 no CAD EF 70%  ASSESSMENT AND PLAN:  Assessment & Plan: 1. Hypertension - diastolic blood pressure mildly elevated. Patient has not taken her antihypertensive medications as of yet. Continue current medical therapy for now. Continue lifestyle changes. Recommended blood pressure log and follow up in one month.  2. Paroxysmal AF s/p ablation March 2013 She follows Dr. Lenord Fellers in [place] annually. Denies palpitations. She continues to take NOAC. No bleedig issues.ECG 12 Lead today shows SR.    3. Aortic stenosis, mild -. patient denies chest pain, shortness of  breath, loss of consciousness.  recommend repeat echocardiogram for monitoring  4. Aortic regurgitation, mild  5. Mitral regurgitation, mild  4. Hyperlipidedemia - per patient's PCP is following up on her labs. PCP has recommended lifestyle changes. Agree with continuing lifestyle changes and monitoring of lipid levels.  5. Obesity - BMI is Body mass index is 29.38 kg/(m^2).Marland Kitchen Recommend aggressive weight loss through diet and increased physical activity.    Current medicines are reviewed at length with the patient today.  The patient does not have concerns regarding medicines.  Labs/ tests ordered today include:  Orders Placed This Encounter  Procedures  . EKG 12-Lead  . Echocardiogram  BP log  I had a lengthy and detailed discussion with the patient regarding diagnoses, prognosis, diagnostic options, treatment options, and side effects of medications.   I counseled the patient on importance of lifestyle modification including heart healthy diet, regular physical activity.   Disposition:   FU with undersigned in 1 month    Signed,  Wende Bushy, MD  12/02/2015 9:42 AM    [facility]

## 2015-12-02 NOTE — Addendum Note (Signed)
Addended by: Valora Corporal on: 12/02/2015 05:45 PM   Modules accepted: Miquel Dunn

## 2015-12-02 NOTE — Addendum Note (Signed)
Addended by: Valora Corporal on: 12/02/2015 04:11 PM   Modules accepted: Miquel Dunn

## 2015-12-02 NOTE — Addendum Note (Signed)
Addended by: Valora Corporal on: 12/02/2015 04:15 PM   Modules accepted: Miquel Dunn

## 2015-12-17 ENCOUNTER — Ambulatory Visit: Payer: Medicare Other | Admitting: Cardiology

## 2015-12-18 DIAGNOSIS — H40003 Preglaucoma, unspecified, bilateral: Secondary | ICD-10-CM | POA: Diagnosis not present

## 2015-12-21 ENCOUNTER — Other Ambulatory Visit: Payer: Self-pay

## 2015-12-21 ENCOUNTER — Ambulatory Visit (INDEPENDENT_AMBULATORY_CARE_PROVIDER_SITE_OTHER): Payer: Medicare Other

## 2015-12-21 DIAGNOSIS — E782 Mixed hyperlipidemia: Secondary | ICD-10-CM | POA: Diagnosis not present

## 2015-12-21 DIAGNOSIS — I350 Nonrheumatic aortic (valve) stenosis: Secondary | ICD-10-CM | POA: Diagnosis not present

## 2015-12-21 DIAGNOSIS — I10 Essential (primary) hypertension: Secondary | ICD-10-CM | POA: Diagnosis not present

## 2015-12-21 DIAGNOSIS — Z961 Presence of intraocular lens: Secondary | ICD-10-CM | POA: Diagnosis not present

## 2015-12-21 DIAGNOSIS — I4891 Unspecified atrial fibrillation: Secondary | ICD-10-CM

## 2015-12-22 LAB — ECHOCARDIOGRAM COMPLETE
CHL CUP AORTIC ROOT 2D: 33 mm
CHL CUP LA VOL 2D INDEX: 26.1 mL/m2
E decel time: 222 msec
EERAT: 5.61
FS: 46 % — AB (ref 28–44)
IVS/LV PW RATIO, ED: 1.24
LA SIZE INDEX: 2.16 mm/m2
LA VOL 2D: 48.3 mL
LA vol index: 27 mL/m2
LA vol: 50.1 mL
LASIZE: 40 mm
LDCA: 2.84 cm2
LEFT ATRIUM END SYS DIAM: 40 mm
LV PW d: 8.36 mm — AB (ref 0.6–1.1)
LV TDI E'LATERAL: 8.92 cm/s
LV TDI E'MEDIAL: 7.29 cm/s
LVIDD: 48.8 mm — AB (ref 3.5–6.0)
LVIDS: 26.2 mm — AB (ref 2.1–4.0)
LVOT MEAN VEL: 56.5 cm/s
LVOT SV INDEX: 35 mL/m2
LVOT VTI: 22.4 cm
LVOT diameter: 19 mm
LVOTPV: 99.8 cm/s
LVOTSV: 64 mL
MV Dec: 222 ms
MV pk A vel: 50 cm/s
MVPKEVEL: 50 cm/s
P 1/2 time: 918 ms
PWSYS: 8.36 mm
TAPSE: 21.6 mm

## 2015-12-30 ENCOUNTER — Encounter: Payer: Self-pay | Admitting: Cardiology

## 2015-12-30 ENCOUNTER — Ambulatory Visit (INDEPENDENT_AMBULATORY_CARE_PROVIDER_SITE_OTHER): Payer: Medicare Other | Admitting: Cardiology

## 2015-12-30 ENCOUNTER — Encounter (INDEPENDENT_AMBULATORY_CARE_PROVIDER_SITE_OTHER): Payer: Self-pay

## 2015-12-30 VITALS — BP 130/80 | HR 50 | Ht 63.0 in | Wt 166.0 lb

## 2015-12-30 DIAGNOSIS — I10 Essential (primary) hypertension: Secondary | ICD-10-CM | POA: Diagnosis not present

## 2015-12-30 DIAGNOSIS — I340 Nonrheumatic mitral (valve) insufficiency: Secondary | ICD-10-CM | POA: Insufficient documentation

## 2015-12-30 DIAGNOSIS — I351 Nonrheumatic aortic (valve) insufficiency: Secondary | ICD-10-CM | POA: Diagnosis not present

## 2015-12-30 DIAGNOSIS — I480 Paroxysmal atrial fibrillation: Secondary | ICD-10-CM | POA: Diagnosis not present

## 2015-12-30 DIAGNOSIS — E782 Mixed hyperlipidemia: Secondary | ICD-10-CM

## 2015-12-30 NOTE — Addendum Note (Signed)
Addended by: Wende Bushy on: 12/30/2015 10:03 AM   Modules accepted: Level of Service

## 2015-12-30 NOTE — Progress Notes (Signed)
Cardiology Office Note   Date:  12/30/2015   ID:  [name], DOB 1941/05/02, MRN [id number]  Referring Doctor:  Eliezer Lofts, MD   Cardiologist:   Wende Bushy, MD   Reason for consultation:  Chief Complaint  Patient presents with  . other    F/u echo no complaints. Meds reviewed verbally with pt.      History of Present Illness: [name] is a 75 y.o. female who presents for Follow-up for hypertension, atrial fibrillation.   In terms of her hypertension, blood pressure log shows that her blood pressure is within the normal range 130s over 80s most of the time. Rarely she would have elevated blood pressure. Heart rate ranged in the upper 50s to mid 60s.  In terms of her atrial fibrillation, she continues to feel asymptomatic. She continues to take her Pradaxa. No palpitations.  In terms of her hyperlipidemia, she is continuing on with her lifestyle changes as recommended by her PCP. She follows up with her PCP for this.  She denies chest pain, shortness of breath, palpitations, loss of consciousness, dizziness, bleeding.   ROS:  Please see the history of present illness. Aside from mentioned under HPI, all other systems are reviewed and negative.     Past Medical History  Diagnosis Date  . Unspecified glaucoma   . HYPERLIPIDEMIA   . HYPERTENSION   . AORTIC STENOSIS     a. echo 11/11: EF 55-60%, mod LVH, mild AS (mean 12), mild AI, mild MR, mild LAE;  b. cath 2/09: no CAD, EF 70%  . Atrial fibrillation ([place])     On Tikosyn  . SINUS BRADYCARDIA   . OSTEOPENIA   . RA (rheumatoid arthritis) ([place])   . Pneumonia 2009  . Anemia   . Chest pain     Past Surgical History  Procedure Laterality Date  . Partial hysterectomy--1979    . Cardiac catheterization    . Thoracentesis   12/20/07    . Colonoscopy  04/18/08  . Ablation of dysrhythmic focus     Atrial fibrillation ablation 2013   reports that she has never smoked. She has never used smokeless  tobacco. She reports that she does not drink alcohol or use illicit drugs.   family history includes Atrial fibrillation in her brother; Breast cancer (age of onset: 50) in her paternal aunt; Heart disease in her mother; Pancreatic cancer in her father.   Current Outpatient Prescriptions  Medication Sig Dispense Refill  . B Complex Vitamins (VITAMIN B COMPLEX) TABS Take 1 tablet by mouth daily.     . Calcium Carb-Cholecalciferol (CALCIUM 1000 + D) 1000-800 MG-UNIT TABS Take 1 tablet by mouth daily.    . dabigatran (PRADAXA) 150 MG CAPS Take 150 mg by mouth every 12 (twelve) hours.    . pindolol (VISKEN) 5 MG tablet Take 1 tablet (5 mg total) by mouth 2 (two) times daily. 30 tablet 0  . spironolactone (ALDACTONE) 25 MG tablet Take 1 tablet (25 mg total) by mouth daily. 30 tablet 0   No current facility-administered medications for this visit.    Allergies: Celebrex and Tramadol    PHYSICAL EXAM: VS:  BP 130/80 mmHg  Pulse 50  Ht 5\' 3"  (1.6 m)  Wt 166 lb (75.297 kg)  BMI 29.41 kg/m2 , Body mass index is 29.41 kg/(m^2). Patient just took her pindolol.  Wt Readings from Last 3 Encounters:  12/30/15 166 lb (75.297 kg)  12/02/15 165 lb 12.8 oz (  75.206 kg)  11/26/15 167 lb (75.751 kg)    GENERAL:  well developed, well nourished,Overweight,  not in acute distress HEENT: normocephalic, pink conjunctivae, anicteric sclerae, no xanthelasma, normal dentition, oropharynx clear NECK:  no neck vein engorgement, JVP normal, no hepatojugular reflux, carotid upstroke brisk and symmetric, no bruit, no thyromegaly, no lymphadenopathy LUNGS:  good respiratory effort, clear to auscultation bilaterally CV:  PMI not displaced, no thrills, no lifts, S1 and S2 within normal limits, no palpable S3 or S4, no rubs, no gallops, 3 out of 6 soft systolic ejection murmur consistent with aortic stenosis, nonradiating, no delay in carotid upstroke or radial pulse - not appreciated today ABD:  Soft, nontender,  nondistended, normoactive bowel sounds, no abdominal aortic bruit, no hepatomegaly, no splenomegaly MS: nontender back, no kyphosis, no scoliosis, no joint deformities EXT:  2+ DP/PT pulses, no edema, no varicosities, no cyanosis, no clubbing, spider veins noted SKIN: warm, nondiaphoretic, normal turgor, no ulcers NEUROPSYCH: alert, oriented to person, place, and time, sensory/motor grossly intact, normal mood, appropriate affect  Recent Labs: 04/10/2015: ALT 14 11/26/2015: BUN 19; Creatinine, Ser 1.09*; Hemoglobin 12.4; Platelets 205; Potassium 4.2; Sodium 140   Lipid Panel    Component Value Date/Time   CHOL 196 04/10/2015 0906   TRIG 55.0 04/10/2015 0906   HDL 67.00 04/10/2015 0906   CHOLHDL 3 04/10/2015 0906   VLDL 11.0 04/10/2015 0906   LDLCALC 118* 04/10/2015 0906   LDLDIRECT 104.4 11/26/2012 0746     Other studies Reviewed:  EKG:  EKG is ordered today, 12/30/2015. Perspnally reviewed by me and it showed sinus bradycardia with sinus arrhythmia, 50 BPM. ST depression inferior and lateral leads. These ST changes are similar to EKG from 12/02/2015 and 09/23/2014.  The ekg 12/02/2015 was personally reviewed by me and it reveals sinus bradycardia 56 BPM,minimal ST depression  inferior and lateral leads. Findings are similar to EKG from 09/23/2014.  EKG from 11/26/2015 12:52 pm showed sinus rhythm 62 bpm, nonspecific ST and T-wave abnormality.  Additional studies/ records that were reviewed personally by me today include:  Echo: 08/30/2010: Moderate LVH, EF 55-60%. Mild aortic stenosis. Mild aortic regurgitation. PHT 586 ms. Velocity ratio VTI of 0.62. Mean gradient 12. Peak gradient 25.Mild MR. Mild LA dilatation. Mild TR. Cath: February 2009 no CAD EF 70%  Echocardiogram 12/21/2015: Left ventricle: The cavity size was normal. There was mild focal  basal hypertrophy of the septum. Systolic function was normal.  The estimated ejection fraction was in the range of 55% to 60%.  Wall  motion was normal; there were no regional wall motion  abnormalities. Left ventricular diastolic function parameters  were normal. - Aortic valve: There was mild regurgitation. - Mitral valve: There was mild to moderate regurgitation. - Left atrium: The atrium was mildly dilated. - Pulmonary arteries: Systolic pressure was within the normal  range.   ASSESSMENT AND PLAN:   Hypertension Blood pressure within normal limits. Blood pressure log over all looks good. Continue current medical therapy for now. Continue lifestyle changes. Recommended to continue blood pressure log.  Paroxysmal AF s/p ablation March 2013 She follows Dr. Lenord Fellers in [place] annually. Denies palpitations. She continues to take NOAC. No bleedig issues.ECG 12 Lead today shows SR. although heart rate is 50 bpm, reviewing her blood pressure log reveals that her heart rate has been in the 60s generally. She has no symptoms of dizziness or lightheadedness.   Aortic stenosis, mild -. patient denies chest pain, shortness of breath, loss of consciousness.  Echocardiogram from 12/21/2015 revealed no evidence of aortic stenosis. We'll continue to monitor.   Aortic regurgitation, mild - continue serial evaluation  Mitral regurgitation, mild to moderate - continue serial evaluation. MR murmur not appreciated today. Recommend good blood pressure control.  Hyperlipidemia - per patient's PCP is following up on her labs. PCP has recommended lifestyle changes. Agree with continuing lifestyle changes and monitoring of lipid levels.  5. Obesity - BMI is Body mass index is 29.41 kg/(m^2).Marland Kitchen Recommend aggressive weight loss through diet and increased physical activity.    Current medicines are reviewed at length with the patient today.  The patient does not have concerns regarding medicines.  Labs/ tests ordered today include:  Orders Placed This Encounter  Procedures  . EKG 12-Lead    I had a lengthy and detailed  discussion with the patient regarding diagnoses, prognosis, diagnostic options, treatment options, and side effects of medications.   I counseled the patient on importance of lifestyle modification including heart healthy diet, regular physical activity.   Disposition:   FU with undersigned in 6 months   Signed, Wende Bushy, MD  12/30/2015 10:00 AM    [place]

## 2015-12-30 NOTE — Patient Instructions (Signed)
Medication Instructions:  Your physician recommends that you continue on your current medications as directed. Please refer to the Current Medication list given to you today.   Labwork: None Ordered  Testing/Procedures: None Ordered  Follow-Up: Your physician wants you to follow-up in: 6 months with Dr. Yvone Neu. You will receive a reminder letter in the mail two months in advance. If you don't receive a letter, please call our office to schedule the follow-up appointment.   Any Other Special Instructions Will Be Listed Below (If Applicable).     If you need a refill on your cardiac medications before your next appointment, please call your pharmacy.

## 2016-01-11 ENCOUNTER — Other Ambulatory Visit: Payer: Self-pay | Admitting: Cardiology

## 2016-03-10 ENCOUNTER — Ambulatory Visit (INDEPENDENT_AMBULATORY_CARE_PROVIDER_SITE_OTHER): Payer: Medicare Other

## 2016-03-10 ENCOUNTER — Telehealth: Payer: Self-pay | Admitting: Family Medicine

## 2016-03-10 ENCOUNTER — Other Ambulatory Visit (INDEPENDENT_AMBULATORY_CARE_PROVIDER_SITE_OTHER): Payer: Medicare Other

## 2016-03-10 VITALS — BP 122/82 | HR 61 | Temp 97.6°F | Ht 63.0 in | Wt 161.5 lb

## 2016-03-10 DIAGNOSIS — E559 Vitamin D deficiency, unspecified: Secondary | ICD-10-CM | POA: Diagnosis not present

## 2016-03-10 DIAGNOSIS — D509 Iron deficiency anemia, unspecified: Secondary | ICD-10-CM | POA: Diagnosis not present

## 2016-03-10 DIAGNOSIS — Z0000 Encounter for general adult medical examination without abnormal findings: Secondary | ICD-10-CM

## 2016-03-10 DIAGNOSIS — E782 Mixed hyperlipidemia: Secondary | ICD-10-CM

## 2016-03-10 DIAGNOSIS — M8580 Other specified disorders of bone density and structure, unspecified site: Secondary | ICD-10-CM

## 2016-03-10 LAB — CBC WITH DIFFERENTIAL/PLATELET
BASOS PCT: 0.7 % (ref 0.0–3.0)
Basophils Absolute: 0 10*3/uL (ref 0.0–0.1)
Eosinophils Absolute: 0.2 10*3/uL (ref 0.0–0.7)
Eosinophils Relative: 3.3 % (ref 0.0–5.0)
HEMATOCRIT: 34.8 % — AB (ref 36.0–46.0)
Hemoglobin: 11.5 g/dL — ABNORMAL LOW (ref 12.0–15.0)
LYMPHS ABS: 2 10*3/uL (ref 0.7–4.0)
LYMPHS PCT: 30.9 % (ref 12.0–46.0)
MCHC: 33 g/dL (ref 30.0–36.0)
MCV: 90.9 fl (ref 78.0–100.0)
MONOS PCT: 5.9 % (ref 3.0–12.0)
Monocytes Absolute: 0.4 10*3/uL (ref 0.1–1.0)
NEUTROS ABS: 3.8 10*3/uL (ref 1.4–7.7)
NEUTROS PCT: 59.2 % (ref 43.0–77.0)
PLATELETS: 202 10*3/uL (ref 150.0–400.0)
RBC: 3.83 Mil/uL — ABNORMAL LOW (ref 3.87–5.11)
RDW: 13.5 % (ref 11.5–15.5)
WBC: 6.4 10*3/uL (ref 4.0–10.5)

## 2016-03-10 LAB — VITAMIN D 25 HYDROXY (VIT D DEFICIENCY, FRACTURES): VITD: 31.62 ng/mL (ref 30.00–100.00)

## 2016-03-10 LAB — COMPREHENSIVE METABOLIC PANEL
ALBUMIN: 4.3 g/dL (ref 3.5–5.2)
ALK PHOS: 55 U/L (ref 39–117)
ALT: 10 U/L (ref 0–35)
AST: 19 U/L (ref 0–37)
BILIRUBIN TOTAL: 0.7 mg/dL (ref 0.2–1.2)
BUN: 28 mg/dL — ABNORMAL HIGH (ref 6–23)
CALCIUM: 9.9 mg/dL (ref 8.4–10.5)
CO2: 28 mEq/L (ref 19–32)
Chloride: 106 mEq/L (ref 96–112)
Creatinine, Ser: 1.3 mg/dL — ABNORMAL HIGH (ref 0.40–1.20)
GFR: 51.53 mL/min — AB (ref >60.00)
GLUCOSE: 91 mg/dL (ref 70–99)
Potassium: 4.4 mEq/L (ref 3.5–5.1)
Sodium: 140 mEq/L (ref 135–145)
TOTAL PROTEIN: 7.6 g/dL (ref 6.0–8.3)

## 2016-03-10 LAB — LIPID PANEL
Cholesterol: 199 mg/dL (ref 0–200)
HDL: 69.5 mg/dL (ref >39.00)
LDL Cholesterol: 120 mg/dL — ABNORMAL HIGH (ref 0–99)
NONHDL: 129.16
Total CHOL/HDL Ratio: 3
Triglycerides: 45 mg/dL (ref 0.0–149.0)
VLDL: 9 mg/dL (ref 0.0–40.0)

## 2016-03-10 NOTE — Progress Notes (Signed)
I reviewed health advisor's note, was available for consultation, and agree with documentation and plan.  Messiyah Waterson, MD [facility] [place]  

## 2016-03-10 NOTE — Telephone Encounter (Signed)
-----   Message from Ellamae Sia sent at 03/03/2016 10:48 AM EDT ----- Regarding: Lab orders for 6.1.17 Patient is scheduled for CPX labs, please order future labs, Thanks , Karna Christmas

## 2016-03-10 NOTE — Progress Notes (Signed)
Subjective:   [name] is a 75 y.o. female who presents for Medicare Annual (Subsequent) preventive examination.  Review of Systems:  N/A Cardiac Risk Factors include: advanced age (>4men, >92 women);dyslipidemia;hypertension     Objective:     Vitals: BP 122/82 mmHg  Pulse 61  Temp(Src) 97.6 F (36.4 C) (Oral)  Ht 5\' 3"  (1.6 m)  Wt 161 lb 8 oz (73.256 kg)  BMI 28.62 kg/m2  SpO2 100%  Body mass index is 28.62 kg/(m^2).   Tobacco History  Smoking status  . Never Smoker   Smokeless tobacco  . Never Used     Counseling given: No   Past Medical History  Diagnosis Date  . Unspecified glaucoma   . HYPERLIPIDEMIA   . HYPERTENSION   . AORTIC STENOSIS     a. echo 11/11: EF 55-60%, mod LVH, mild AS (mean 12), mild AI, mild MR, mild LAE;  b. cath 2/09: no CAD, EF 70%  . Atrial fibrillation ([place])     On Tikosyn  . SINUS BRADYCARDIA   . OSTEOPENIA   . RA (rheumatoid arthritis) ([place])   . Pneumonia 2009  . Anemia   . Chest pain    Past Surgical History  Procedure Laterality Date  . Partial hysterectomy--1979    . Cardiac catheterization    . Thoracentesis   12/20/07    . Colonoscopy  04/18/08  . Ablation of dysrhythmic focus     Family History  Problem Relation Age of Onset  . Diabetes    . Breast cancer Paternal Aunt 11  . Heart disease Mother   . Pancreatic cancer Father   . Atrial fibrillation Brother    History  Sexual Activity  . Sexual Activity: No    Outpatient Encounter Prescriptions as of 03/10/2016  Medication Sig  . B Complex Vitamins (VITAMIN B COMPLEX) TABS Take 1 tablet by mouth Jacqueline.   . Calcium Carb-Cholecalciferol (CALCIUM 1000 + D) 1000-800 MG-UNIT TABS Take 1 tablet by mouth Jacqueline.  . dabigatran (PRADAXA) 150 MG CAPS Take 150 mg by mouth every 12 (twelve) hours.  . pindolol (VISKEN) 5 MG tablet Take 1 tablet (5 mg total) by mouth 2 (two) times Jacqueline.  Marland Kitchen spironolactone (ALDACTONE) 25 MG tablet TAKE 1 TABLET BY MOUTH Jacqueline   No  facility-administered encounter medications on file as of 03/10/2016.    Activities of Jacqueline Living In your present state of health, do you have any difficulty performing the following activities: 03/10/2016  Hearing? N  Vision? N  Difficulty concentrating or making decisions? N  Walking or climbing stairs? N  Dressing or bathing? N  Doing errands, shopping? N  Preparing Food and eating ? N  Using the Toilet? N  In the past six months, have you accidently leaked urine? N  Do you have problems with loss of bowel control? N  Managing your Medications? N  Managing your Finances? N  Housekeeping or managing your Housekeeping? N    Patient Care Team: [name], MD as PCP - General Birder Robson, MD as Referring Physician (Ophthalmology)    Assessment:     Hearing Screening   125Hz  250Hz  500Hz  1000Hz  2000Hz  4000Hz  8000Hz   Right ear:   0 0 40 40   Left ear:   40 0 40 40   Vision Screening Comments: Last eye exam in Feb 2017   Exercise Activities and Dietary recommendations Current Exercise Habits: Home exercise routine, Type of exercise: strength training/weights, Time (Minutes): 30, Frequency (  Times/Week): 3, Weekly Exercise (Minutes/Week): 90, Intensity: Moderate, Exercise limited by: None identified  Goals    . Weight < 148 lb (67.132 kg)     Target weight is 147lbs. Starting 03/10/2016, I will continue to exercise for at least 30 min 3 days per week.       Fall Risk Fall Risk  03/10/2016 05/11/2015 04/17/2015 02/07/2014 11/30/2012  Falls in the past year? No No No No No   Depression Screen PHQ 2/9 Scores 03/10/2016 05/11/2015 04/17/2015 02/03/2015  PHQ - 2 Score 0 0 0 0     Cognitive Testing MMSE - Mini Mental State Exam 03/10/2016  Orientation to time 5  Orientation to Place 5  Registration 3  Attention/ Calculation 0  Recall 3  Language- name 2 objects 0  Language- repeat 1  Language- follow 3 step command 0  Language- follow 3 step command-comments pt was unable to follow  3 steps to 3 step command  Language- read & follow direction 0  Write a sentence 0  Copy design 0  Total score 17   PLEASE NOTE: A Mini-Cog screen was completed. Maximum score is 20. A value of 0 denotes this part of [place] MMSE was not completed or the patient failed this part of the Mini-Cog screening.   Mini-Cog Screening Orientation to Time - Max 5 pts Orientation to Place - Max 5 pts Registration - Max 3 pts Recall - Max 3 pts Language Repeat - Max 1 pts Language Follow 3 Step Command - Max 3 pts  Immunization History  Administered Date(s) Administered  . Influenza Whole 08/05/2009  . Influenza, High Dose Seasonal PF 07/14/2015  . PPD Test 05/01/2012, 04/17/2013  . Pneumococcal Conjugate-13 04/17/2015  . Pneumococcal Polysaccharide-23 10/11/2007  . Td 10/11/2003   Screening Tests Health Maintenance  Topic Date Due  . ZOSTAVAX  03/10/2017 (Originally 06/19/2002)  . TETANUS/TDAP  03/10/2017 (Originally 10/10/2013)  . INFLUENZA VACCINE  05/10/2016  . MAMMOGRAM  04/27/2017  . COLONOSCOPY  04/21/2018  . DEXA SCAN  Completed  . PNA vac Low Risk Adult  Completed      Plan:     I have personally reviewed and addressed the Medicare Annual Wellness questionnaire and have noted the following in the patient's chart:  A. Medical and social history B. Use of alcohol, tobacco or illicit drugs  C. Current medications and supplements D. Functional ability and status E.  Nutritional status F.  Physical activity G. Advance directives H. List of other physicians I.  Hospitalizations, surgeries, and ER visits in previous 12 months J.  [place] to include hearing, vision, cognitive, depression L. Referrals and appointments - none  In addition, I have reviewed and discussed with patient certain preventive protocols, quality metrics, and best practice recommendations. A written personalized care plan for preventive services as well as general preventive health  recommendations were provided to patient.  See attached scanned questionnaire for additional information.   Signed,   Lindell Noe, MHA, BS, LPN Health Advisor

## 2016-03-10 NOTE — Patient Instructions (Signed)
[name] , Thank you for taking time to come for your Medicare Wellness Visit. I appreciate your ongoing commitment to your health goals. Please review the following plan we discussed and let me know if I can assist you in the future.   These are the goals we discussed: Goals    . Weight < 148 lb (67.132 kg)     Target weight is 147lbs. Starting 03/10/2016, I will continue to exercise for at least 30 min 3 days per week.        This is a list of the screening recommended for you and due dates:  Health Maintenance  Topic Date Due  . Shingles Vaccine  03/10/2017*  . Tetanus Vaccine  03/10/2017*  . Flu Shot  05/10/2016  . Mammogram  04/27/2017  . Colon Cancer Screening  04/21/2018  . DEXA scan (bone density measurement)  Completed  . Pneumonia vaccines  Completed  *Topic was postponed. The date shown is not the original due date.    Preventive Care for Adults  A healthy lifestyle and preventive care can promote health and wellness. Preventive health guidelines for adults include the following key practices.  . A routine yearly physical is a good way to check with your health care provider about your health and preventive screening. It is a chance to share any concerns and updates on your health and to receive a thorough exam.  . Visit your dentist for a routine exam and preventive care every 6 months. Brush your teeth twice a day and floss once a day. Good oral hygiene prevents tooth decay and gum disease.  . The frequency of eye exams is based on your age, health, family medical history, use  of contact lenses, and other factors. Follow your health care provider's ecommendations for frequency of eye exams.  . Eat a healthy diet. Foods like vegetables, fruits, whole grains, low-fat dairy products, and lean protein foods contain the nutrients you need without too many calories. Decrease your intake of foods high in solid fats, added sugars, and salt. Eat the right amount of calories  for you. Get information about a proper diet from your health care provider, if necessary.  . Regular physical exercise is one of the most important things you can do for your health. Most adults should get at least 150 minutes of moderate-intensity exercise (any activity that increases your heart rate and causes you to sweat) each week. In addition, most adults need muscle-strengthening exercises on 2 or more days a week.  Silver Sneakers may be a benefit available to you. To determine eligibility, you may visit the website: www.silversneakers.com or contact program at [phone] Mon-Fri between 8AM-8PM.   . Maintain a healthy weight. The body mass index (BMI) is a screening tool to identify possible weight problems. It provides an estimate of body fat based on height and weight. Your health care provider can find your BMI and can help you achieve or maintain a healthy weight.   For adults 20 years and older: ? A BMI below 18.5 is considered underweight. ? A BMI of 18.5 to 24.9 is normal. ? A BMI of 25 to 29.9 is considered overweight. ? A BMI of 30 and above is considered obese.   . Maintain normal blood lipids and cholesterol levels by exercising and minimizing your intake of saturated fat. Eat a balanced diet with plenty of fruit and vegetables. Blood tests for lipids and cholesterol should begin at age 67 and be repeated every 5  years. If your lipid or cholesterol levels are high, you are over 50, or you are at high risk for heart disease, you may need your cholesterol levels checked more frequently. Ongoing high lipid and cholesterol levels should be treated with medicines if diet and exercise are not working.  . If you smoke, find out from your health care provider how to quit. If you do not use tobacco, please do not start.  . If you choose to drink alcohol, please do not consume more than 2 drinks per day. One drink is considered to be 12 ounces (355 mL) of beer, 5 ounces (148 mL) of  wine, or 1.5 ounces (44 mL) of liquor.  . If you are 58-25 years old, ask your health care provider if you should take aspirin to prevent strokes.  . Use sunscreen. Apply sunscreen liberally and repeatedly throughout the day. You should seek shade when your shadow is shorter than you. Protect yourself by wearing long sleeves, pants, a wide-brimmed hat, and sunglasses year round, whenever you are outdoors.  . Once a month, do a whole body skin exam, using a mirror to look at the skin on your back. Tell your health care provider of new moles, moles that have irregular borders, moles that are larger than a pencil eraser, or moles that have changed in shape or color.

## 2016-03-10 NOTE — Progress Notes (Signed)
PCP notes:  Health maintenance:  Shingles - postponed/insurance Tetanus - postponed/insurance  Abnormal screenings:  Hearing - failed Cognitive - Mini-Cog score 17/20  Patient concerns: Co-pay for Pradaxa; went to drug manufacturer website and ordered pt a savings card for medication; also provided pt with several prescription savings cards that were available in office  Nurse concerns: None  Next PCP appt: 03/14/16 @ 1115

## 2016-03-10 NOTE — Progress Notes (Signed)
Pre visit review using our clinic review tool, if applicable. No additional management support is needed unless otherwise documented below in the visit note. 

## 2016-03-14 ENCOUNTER — Encounter: Payer: Self-pay | Admitting: Family Medicine

## 2016-03-14 ENCOUNTER — Ambulatory Visit (INDEPENDENT_AMBULATORY_CARE_PROVIDER_SITE_OTHER): Payer: Medicare Other | Admitting: Family Medicine

## 2016-03-14 VITALS — BP 164/79 | HR 59 | Temp 98.2°F | Ht 63.0 in | Wt 163.0 lb

## 2016-03-14 DIAGNOSIS — E559 Vitamin D deficiency, unspecified: Secondary | ICD-10-CM | POA: Diagnosis not present

## 2016-03-14 DIAGNOSIS — D509 Iron deficiency anemia, unspecified: Secondary | ICD-10-CM

## 2016-03-14 DIAGNOSIS — I480 Paroxysmal atrial fibrillation: Secondary | ICD-10-CM | POA: Diagnosis not present

## 2016-03-14 DIAGNOSIS — N1832 Chronic kidney disease, stage 3b: Secondary | ICD-10-CM | POA: Insufficient documentation

## 2016-03-14 DIAGNOSIS — N183 Chronic kidney disease, stage 3 unspecified: Secondary | ICD-10-CM

## 2016-03-14 DIAGNOSIS — E782 Mixed hyperlipidemia: Secondary | ICD-10-CM

## 2016-03-14 DIAGNOSIS — I10 Essential (primary) hypertension: Secondary | ICD-10-CM | POA: Diagnosis not present

## 2016-03-14 DIAGNOSIS — M069 Rheumatoid arthritis, unspecified: Secondary | ICD-10-CM

## 2016-03-14 DIAGNOSIS — Z8739 Personal history of other diseases of the musculoskeletal system and connective tissue: Secondary | ICD-10-CM | POA: Insufficient documentation

## 2016-03-14 NOTE — Assessment & Plan Note (Signed)
Previously controlled on current meds. Will follow at home to make sure good control remians.

## 2016-03-14 NOTE — Patient Instructions (Addendum)
Follow BP at home.. Every few days to make controlled. Goal < 140/90. Stop at front desk on way out for referral to rheum. Get Tdap and shingles vaccine at pharmacy.

## 2016-03-14 NOTE — Assessment & Plan Note (Signed)
Recommend return to rheum for re-eval given worsening deformity in hands.

## 2016-03-14 NOTE — Assessment & Plan Note (Signed)
Resolved on supplement 

## 2016-03-14 NOTE — Assessment & Plan Note (Signed)
Stable on pradaxa for anticoag, rate controlled.

## 2016-03-14 NOTE — Assessment & Plan Note (Signed)
Stable , discussed avoiding nephrotoxic meds and staying hydrated.  Likely secondary to chronic hypertension.

## 2016-03-14 NOTE — Progress Notes (Signed)
Pre visit review using our clinic review tool, if applicable. No additional management support is needed unless otherwise documented below in the visit note. 

## 2016-03-14 NOTE — Progress Notes (Signed)
Earlier on 03/10/2016 she saw Candis Musa, LPN for medicare wellness. Note was be reviewed in detail.  Pt returns today for PART 2 review of chronic health issues.  Hypertension: Well controlled on pindolol and spirolactone previously and at cardiology [place] in 12/2015. BP Readings from Last 3 Encounters:  03/14/16 164/79  03/10/16 122/82  12/30/15 130/80  Using medication without problems or lightheadedness: None  Chest pain with exertion: none  Edema:off and on  Short of breath:None  Average home BPs: 130/70 Other issues:   Elevated Cholesterol: LDL at goal <130 on no med. Lab Results  Component Value Date   CHOL 199 03/10/2016   HDL 69.50 03/10/2016   LDLCALC 120* 03/10/2016   LDLDIRECT 104.4 11/26/2012   TRIG 45.0 03/10/2016   CHOLHDL 3 03/10/2016  Diet control, healthy low chol diet, no medication.  Exercise None, joined a gym but not getting time to go.  Body mass index is 28.88 kg/(m^2). Wt Readings from Last 3 Encounters:  03/14/16 163 lb (73.936 kg)  03/10/16 161 lb 8 oz (73.256 kg)  12/30/15 166 lb (75.297 kg)   Parox Afib maintaining NSR no longer on Tikosyn therapy, now maintained on pindolol, sinus bradycardia, aortic stenosis. Echo 2011 demonstrated normal left ventricular function and mild aortic stenosis  Followed by Dr. Yvone Neu Last OV 12/2015 On Pradaxa for anticogaulant  RA, off prednisone... not seeing rheumatologist. Minimal pain, she does have progressive deformity in hands that she is concerned about.  Vit d def resolved on supplement.  Anemia, chronic: stable.  Social History /Family History/Past Medical History reviewed and updated if needed.  Review of Systems  Constitutional: No fatigue, no fever.  HENT: Negative for ear pain.  Eyes: Negative for pain.  Respiratory: Negative for chest tightness and shortness of breath.  Cardiovascular: Negative for chest pain, palpitations and leg swelling.  Gastrointestinal: Negative for  abdominal pain.  Genitourinary: Negative for dysuria.  Skin: Negative for rash.  Psychiatric/Behavioral: Negative for dysphoric mood. The patient is not nervous/anxious.  No longer with back pain at this time... coc recurs but treated with tylenol.  Objective:   Physical Exam  Constitutional: Vital signs are normal. She appears well-developed and well-nourished. She is cooperative. Non-toxic appearance. She does not appear ill. No distress.  HENT:  Head: Normocephalic.  Right Ear: Hearing, tympanic membrane, external ear and ear canal normal.  Left Ear: Hearing, tympanic membrane, external ear and ear canal normal.  Nose: Nose normal.  Eyes: Conjunctivae, EOM and lids are normal. Pupils are equal, round, and reactive to light. No foreign bodies found.  Neck: Trachea normal and normal range of motion. Neck supple. Carotid bruit is not present. No mass and no thyromegaly present.  Cardiovascular: Normal rate, regular rhythm, S1 normal, S2 normal and intact distal pulses. Exam reveals no gallop.  Murmur heard.  Systolic murmur is present with a grade of 2/6  No peripheral swelling B varicosisties in legs.  Pulmonary/Chest: Effort normal and breath sounds normal. No respiratory distress. She has no wheezes. She has no rhonchi. She has no rales.  Abdominal: Soft. Normal appearance and bowel sounds are normal. She exhibits no distension, no fluid wave, no abdominal bruit and no mass. There is no hepatosplenomegaly. There is no tenderness. There is no rebound, no guarding and no CVA tenderness. No hernia.  Genitourinary: No breast swelling, tenderness, discharge or bleeding. Pelvic exam was performed with patient prone.  no pap/DVE indicated.  Lymphadenopathy:  She has no cervical adenopathy.  She has no  axillary adenopathy.  Neurological: She is alert. She has normal strength. No cranial nerve deficit or sensory deficit.  Skin: Skin is warm, dry and intact. No rash noted.   Psychiatric: Her speech is normal and behavior is normal. Judgment normal. Her mood appears not anxious. Cognition and memory are normal. She does not exhibit a depressed mood.  Assessment & Plan:    AMW Uptodate: Osteopenia: last DEXA 2016, stable, osteopenia  repeat planned in 5 years  Colonoscopy: 2009 Dr.Jacob, repeat in 2019  Partial Hysterectomy: no pap indicated, no DVE indicated. No family or personal history of ovarian cancer, asymptomatic.  Vaccines: Uptodate,Postopned zostavax, tdap. Mammogram: to be scheduled.  Failed hearing screen. Denied hearing issues except mild. Minicog score 17/20. She denies memory issues. Repeated test.. Pt performed normally.

## 2016-03-14 NOTE — Assessment & Plan Note (Signed)
Stable mild.

## 2016-03-14 NOTE — Assessment & Plan Note (Signed)
Stable on diet control.

## 2016-04-15 DIAGNOSIS — M19042 Primary osteoarthritis, left hand: Secondary | ICD-10-CM | POA: Insufficient documentation

## 2016-04-15 DIAGNOSIS — M79642 Pain in left hand: Secondary | ICD-10-CM | POA: Diagnosis not present

## 2016-04-15 DIAGNOSIS — M79641 Pain in right hand: Secondary | ICD-10-CM | POA: Diagnosis not present

## 2016-04-15 DIAGNOSIS — M8580 Other specified disorders of bone density and structure, unspecified site: Secondary | ICD-10-CM | POA: Diagnosis not present

## 2016-04-15 DIAGNOSIS — I10 Essential (primary) hypertension: Secondary | ICD-10-CM | POA: Diagnosis not present

## 2016-04-15 DIAGNOSIS — M19041 Primary osteoarthritis, right hand: Secondary | ICD-10-CM | POA: Insufficient documentation

## 2016-08-09 ENCOUNTER — Ambulatory Visit (INDEPENDENT_AMBULATORY_CARE_PROVIDER_SITE_OTHER): Payer: Medicare Other | Admitting: Cardiology

## 2016-08-09 ENCOUNTER — Encounter: Payer: Self-pay | Admitting: Cardiology

## 2016-08-09 VITALS — BP 140/78 | HR 58 | Ht 63.0 in | Wt 156.0 lb

## 2016-08-09 DIAGNOSIS — I340 Nonrheumatic mitral (valve) insufficiency: Secondary | ICD-10-CM | POA: Diagnosis not present

## 2016-08-09 DIAGNOSIS — I10 Essential (primary) hypertension: Secondary | ICD-10-CM | POA: Diagnosis not present

## 2016-08-09 DIAGNOSIS — I495 Sick sinus syndrome: Secondary | ICD-10-CM | POA: Diagnosis not present

## 2016-08-09 DIAGNOSIS — I351 Nonrheumatic aortic (valve) insufficiency: Secondary | ICD-10-CM

## 2016-08-09 DIAGNOSIS — I480 Paroxysmal atrial fibrillation: Secondary | ICD-10-CM

## 2016-08-09 NOTE — Progress Notes (Signed)
Cardiology Office Note   Date:  08/09/2016   ID:  [name], DOB 01-01-1941, MRN [id number]  Referring Doctor:  Eliezer Lofts, MD   Cardiologist:   Wende Bushy, MD   Reason for consultation:  Chief Complaint  Patient presents with  . Other    6 month f/u. Swelling in her ankles.      History of Present Illness: [name] is a 75 y.o. female who presents for Follow-up for hypertension, atrial fibrillation.    Since last visit, patient continues to do well. Blood pressure has been within normal limits at home. She has lost roughly around 10 pounds since last visit and she feels much better. She continues to watch her diet. Next  In terms of age of fibrillation she continues to take her Pradaxa. No episodes of rapid heart rates.  In terms of her hyperlipidemia, she is continuing on with her lifestyle changes as recommended by her PCP. She follows up with her PCP for this.  She denies chest pain, shortness of breath, palpitations, loss of consciousness, dizziness, bleeding.   ROS:  Please see the history of present illness. Aside from mentioned under HPI, all other systems are reviewed and negative.     Past Medical History:  Diagnosis Date  . Anemia   . AORTIC STENOSIS    a. echo 11/11: EF 55-60%, mod LVH, mild AS (mean 12), mild AI, mild MR, mild LAE;  b. cath 2/09: no CAD, EF 70%  . Atrial fibrillation ([place])    On Tikosyn  . Chest pain   . HYPERLIPIDEMIA   . HYPERTENSION   . OSTEOPENIA   . Pneumonia 2009  . RA (rheumatoid arthritis) ([place])   . SINUS BRADYCARDIA   . Unspecified glaucoma(365.9)     Past Surgical History:  Procedure Laterality Date  . ABLATION OF DYSRHYTHMIC FOCUS    . CARDIAC CATHETERIZATION    . COLONOSCOPY  04/18/08  . Partial hysterectomy--1979    . Thoracentesis   12/20/07     Atrial fibrillation ablation 2013   reports that she has never smoked. She has never used smokeless tobacco. She reports that she does not drink  alcohol or use drugs.   family history includes Atrial fibrillation in her brother; Breast cancer (age of onset: 61) in her paternal aunt; Heart disease in her mother; Pancreatic cancer in her father.   Current Outpatient Prescriptions  Medication Sig Dispense Refill  . B Complex Vitamins (VITAMIN B COMPLEX) TABS Take 1 tablet by mouth daily.     . Calcium Carb-Cholecalciferol (CALCIUM 1000 + D) 1000-800 MG-UNIT TABS Take 1 tablet by mouth daily.    . dabigatran (PRADAXA) 150 MG CAPS Take 150 mg by mouth every 12 (twelve) hours.    . pindolol (VISKEN) 5 MG tablet Take 1 tablet (5 mg total) by mouth 2 (two) times daily. 30 tablet 0  . spironolactone (ALDACTONE) 25 MG tablet TAKE 1 TABLET BY MOUTH DAILY 30 tablet 6   No current facility-administered medications for this visit.     Allergies: Celebrex [celecoxib] and Tramadol    PHYSICAL EXAM: VS:  BP 140/78   Pulse (!) 58   Ht 5\' 3"  (1.6 m)   Wt 156 lb (70.8 kg)   BMI 27.63 kg/m  , Body mass index is 27.63 kg/m. Patient just took her pindolol.  Wt Readings from Last 3 Encounters:  08/09/16 156 lb (70.8 kg)  03/14/16 163 lb (73.9 kg)  03/10/16 161 lb  8 oz (73.3 kg)    GENERAL:  well developed, well nourished,Overweight,  not in acute distress HEENT: normocephalic, pink conjunctivae, anicteric sclerae, no xanthelasma, normal dentition, oropharynx clear NECK:  no neck vein engorgement, JVP normal, no hepatojugular reflux, carotid upstroke brisk and symmetric, no bruit, no thyromegaly, no lymphadenopathy LUNGS:  good respiratory effort, clear to auscultation bilaterally CV:  PMI not displaced, no thrills, no lifts, S1 and S2 within normal limits, no palpable S3 or S4, no rubs, no gallops, 3 out of 6 soft systolic ejection murmur consistent with aortic stenosis, nonradiating, no delay in carotid upstroke or radial pulse - not appreciated today ABD:  Soft, nontender, nondistended, normoactive bowel sounds, no abdominal aortic bruit, no  hepatomegaly, no splenomegaly MS: nontender back, no kyphosis, no scoliosis, no joint deformities EXT:  2+ DP/PT pulses, no edema, no varicosities, no cyanosis, no clubbing, spider veins noted SKIN: warm, nondiaphoretic, normal turgor, no ulcers NEUROPSYCH: alert, oriented to person, place, and time, sensory/motor grossly intact, normal mood, appropriate affect  Recent Labs: 03/10/2016: ALT 10; BUN 28; Creatinine, Ser 1.30; Hemoglobin 11.5; Platelets 202.0; Potassium 4.4; Sodium 140   Lipid Panel    Component Value Date/Time   CHOL 199 03/10/2016 1128   TRIG 45.0 03/10/2016 1128   HDL 69.50 03/10/2016 1128   CHOLHDL 3 03/10/2016 1128   VLDL 9.0 03/10/2016 1128   LDLCALC 120 (H) 03/10/2016 1128   LDLDIRECT 104.4 11/26/2012 0746     Other studies Reviewed:  EKG:    EKG from 08/09/2016 was personally reviewed by me. Showed sinus bradycardic, 58 BPM. Nonspecific ST-T wave changes. No significant change from previous EKG    EKG is ordered today, 12/30/2015. Perspnally reviewed by me and it showed sinus bradycardia with sinus arrhythmia, 50 BPM. ST depression inferior and lateral leads. These ST changes are similar to EKG from 12/02/2015 and 09/23/2014.  The ekg 12/02/2015 was personally reviewed by me and it reveals sinus bradycardia 56 BPM,minimal ST depression  inferior and lateral leads. Findings are similar to EKG from 09/23/2014  EKG from 11/26/2015 12:52 pm showed sinus rhythm 62 bpm, nonspecific ST and T-wave abnormality.  Additional studies/ records that were reviewed personally by me today include:  Echo: 08/30/2010: Moderate LVH, EF 55-60%. Mild aortic stenosis. Mild aortic regurgitation. PHT 586 ms. Velocity ratio VTI of 0.62. Mean gradient 12. Peak gradient 25.Mild MR. Mild LA dilatation. Mild TR. Cath: February 2009 no CAD EF 70%  Echocardiogram 12/21/2015: Left ventricle: The cavity size was normal. There was mild focal  basal hypertrophy of the septum. Systolic function  was normal.  The estimated ejection fraction was in the range of 55% to 60%.  Wall motion was normal; there were no regional wall motion  abnormalities. Left ventricular diastolic function parameters  were normal. - Aortic valve: There was mild regurgitation. - Mitral valve: There was mild to moderate regurgitation. - Left atrium: The atrium was mildly dilated. - Pulmonary arteries: Systolic pressure was within the normal  range.   ASSESSMENT AND PLAN:   Hypertension BP is well controlled. Continue monitoring BP. Continue current medical therapy and lifestyle changes.  Paroxysmal AF s/p ablation March 2013 She follows Dr. Lenord Fellers in [place] annually. Denies palpitations. She continues to take NOAC. No bleedig issues.ECG 12 Lead today shows SR. although heart rate is 50 bpm, reviewing her blood pressure log reveals that her heart rate has been in the 60s generally. She has no symptoms of dizziness or lightheadedness.   Aortic stenosis, mild -.  patient denies chest pain, shortness of breath, loss of consciousness.  Echocardiogram from 12/21/2015 revealed no evidence of aortic stenosis. We'll continue to monitor.   Aortic regurgitation, mild - continue serial evaluation  Mitral regurgitation, mild to moderate - continue serial evaluation. MR murmur not appreciated today. Recommend good blood pressure control.  Hyperlipidemia - per patient's PCP is following up on her labs. PCP has recommended lifestyle changes. Agree with continuing lifestyle changes and monitoring of lipid levels.   Current medicines are reviewed at length with the patient today.  The patient does not have concerns regarding medicines.  Labs/ tests ordered today include:  No orders of the defined types were placed in this encounter.   I had a lengthy and detailed discussion with the patient regarding diagnoses, prognosis, diagnostic options, treatment options, and side effects of medications.   I  counseled the patient on importance of lifestyle modification including heart healthy diet, regular physical activity.   Disposition:   FU with undersigned in 6 months   Signed, Wende Bushy, MD  08/09/2016 4:10 PM    [facility]

## 2016-08-09 NOTE — Patient Instructions (Signed)
Follow-Up: Your physician wants you to follow-up in: 6 months with Dr. Ingal. You will receive a reminder letter in the mail two months in advance. If you don't receive a letter, please call our office to schedule the follow-up appointment.  It was a pleasure seeing you today here in the office. Please do not hesitate to give us a call back if you have any further questions. [phone]  Salim Forero A. RN, BSN    

## 2016-08-19 ENCOUNTER — Other Ambulatory Visit: Payer: Self-pay | Admitting: Cardiology

## 2016-08-25 DIAGNOSIS — I482 Chronic atrial fibrillation: Secondary | ICD-10-CM | POA: Diagnosis not present

## 2016-08-25 DIAGNOSIS — I10 Essential (primary) hypertension: Secondary | ICD-10-CM | POA: Diagnosis not present

## 2016-08-25 DIAGNOSIS — R001 Bradycardia, unspecified: Secondary | ICD-10-CM | POA: Diagnosis not present

## 2016-08-25 DIAGNOSIS — I480 Paroxysmal atrial fibrillation: Secondary | ICD-10-CM | POA: Diagnosis not present

## 2016-08-25 DIAGNOSIS — Z23 Encounter for immunization: Secondary | ICD-10-CM | POA: Diagnosis not present

## 2016-09-13 ENCOUNTER — Other Ambulatory Visit: Payer: Self-pay | Admitting: Family Medicine

## 2016-12-21 ENCOUNTER — Other Ambulatory Visit: Payer: Self-pay | Admitting: Cardiology

## 2016-12-21 ENCOUNTER — Other Ambulatory Visit: Payer: Self-pay | Admitting: Family Medicine

## 2016-12-21 DIAGNOSIS — Z1231 Encounter for screening mammogram for malignant neoplasm of breast: Secondary | ICD-10-CM

## 2017-01-19 ENCOUNTER — Ambulatory Visit
Admission: RE | Admit: 2017-01-19 | Discharge: 2017-01-19 | Disposition: A | Discharge status: Home / Self Care | Payer: Medicare Other | Source: Ambulatory Visit | Attending: Family Medicine | Admitting: Family Medicine

## 2017-01-19 DIAGNOSIS — R928 Other abnormal and inconclusive findings on diagnostic imaging of breast: Secondary | ICD-10-CM | POA: Diagnosis not present

## 2017-01-19 DIAGNOSIS — Z1231 Encounter for screening mammogram for malignant neoplasm of breast: Secondary | ICD-10-CM | POA: Insufficient documentation

## 2017-01-20 ENCOUNTER — Other Ambulatory Visit: Payer: Self-pay | Admitting: Family Medicine

## 2017-01-20 DIAGNOSIS — R928 Other abnormal and inconclusive findings on diagnostic imaging of breast: Secondary | ICD-10-CM

## 2017-01-20 DIAGNOSIS — N6489 Other specified disorders of breast: Secondary | ICD-10-CM

## 2017-01-30 ENCOUNTER — Other Ambulatory Visit: Payer: Self-pay | Admitting: Cardiology

## 2017-02-02 DIAGNOSIS — H40002 Preglaucoma, unspecified, left eye: Secondary | ICD-10-CM | POA: Diagnosis not present

## 2017-02-07 ENCOUNTER — Ambulatory Visit: Admission: RE | Admit: 2017-02-07 | Payer: Medicare Other | Source: Ambulatory Visit

## 2017-02-07 ENCOUNTER — Other Ambulatory Visit: Payer: Medicare Other

## 2017-02-16 ENCOUNTER — Ambulatory Visit (INDEPENDENT_AMBULATORY_CARE_PROVIDER_SITE_OTHER): Payer: Medicare Other | Admitting: Cardiology

## 2017-02-16 ENCOUNTER — Encounter: Payer: Self-pay | Admitting: Cardiology

## 2017-02-16 VITALS — BP 126/78 | HR 54 | Ht 63.0 in | Wt 147.5 lb

## 2017-02-16 DIAGNOSIS — I340 Nonrheumatic mitral (valve) insufficiency: Secondary | ICD-10-CM | POA: Diagnosis not present

## 2017-02-16 DIAGNOSIS — I10 Essential (primary) hypertension: Secondary | ICD-10-CM | POA: Diagnosis not present

## 2017-02-16 DIAGNOSIS — I480 Paroxysmal atrial fibrillation: Secondary | ICD-10-CM

## 2017-02-16 DIAGNOSIS — I351 Nonrheumatic aortic (valve) insufficiency: Secondary | ICD-10-CM

## 2017-02-16 NOTE — Patient Instructions (Signed)
Testing/Procedures: Your physician has requested that you have an echocardiogram in 6 months. Echocardiography is a painless test that uses sound waves to create images of your heart. It provides your doctor with information about the size and shape of your heart and how well your heart's chambers and valves are working. This procedure takes approximately one hour. There are no restrictions for this procedure.    Follow-Up: Your physician wants you to follow-up in: 6 months after echo. You will receive a reminder letter in the mail two months in advance. If you don't receive a letter, please call our office to schedule the follow-up appointment.  It was a pleasure seeing you today here in the office. Please do not hesitate to give [place] a call back if you have any further questions. [place], BSN

## 2017-02-16 NOTE — Progress Notes (Signed)
Cardiology Office Note   Date:  02/16/2017   ID:  [name], DOB 1941-01-28, MRN [id number]  Referring Doctor:  [name], MD   Cardiologist:   Wende Bushy, MD   Reason for consultation:  Chief Complaint  Patient presents with  . OTHER    6 month f/u c/o chest pain. Meds reviewed verbally with pt.      History of Present Illness: [name] is a 76 y.o. female who presents for Follow-up for hypertension, atrial fibrillation  Since last visit, she has had one episode of palpitation at night. They were not intense, and she was able to go back to sleep shortly. No recurrence since then. This was roughly a month ago. She continues to take her Pradaxa. No recurrence of palpitations.  In terms of hypertension, she keeps a close eye on it and has been within the normal limits. She continues to try to stay active. 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  She is due to see her PCP sometime in June for regular visit and blood work.  ROS:  Please see the history of present illness. Aside from mentioned under HPI, all other systems are reviewed and negative.    Past Medical History:  Diagnosis Date  . Anemia   . AORTIC STENOSIS    a. echo 11/11: EF 55-60%, mod LVH, mild AS (mean 12), mild AI, mild MR, mild LAE;  b. cath 2/09: no CAD, EF 70%  . Atrial fibrillation ([place])    On Tikosyn  . Chest pain   . HYPERLIPIDEMIA   . HYPERTENSION   . OSTEOPENIA   . Pneumonia 2009  . RA (rheumatoid arthritis) ([place])   . SINUS BRADYCARDIA   . Unspecified glaucoma(365.9)     Past Surgical History:  Procedure Laterality Date  . ABLATION OF DYSRHYTHMIC FOCUS    . CARDIAC CATHETERIZATION    . COLONOSCOPY  04/18/08  . Partial hysterectomy--1979    . Thoracentesis   12/20/07     Atrial fibrillation ablation 2013   reports that she has never smoked. She has never used smokeless tobacco. She reports that she does not drink alcohol or use drugs.   family history includes  Atrial fibrillation in her brother; Breast cancer (age of onset: 84) in her paternal aunt; Heart disease in her mother; Pancreatic cancer in her father.   Current Outpatient Prescriptions  Medication Sig Dispense Refill  . B Complex Vitamins (VITAMIN B COMPLEX) TABS Take 1 tablet by mouth daily.     . Calcium Carb-Cholecalciferol (CALCIUM 1000 + D) 1000-800 MG-UNIT TABS Take 1 tablet by mouth daily.    . dabigatran (PRADAXA) 150 MG CAPS Take 150 mg by mouth every 12 (twelve) hours.    . pindolol (VISKEN) 5 MG tablet Take 1 tablet (5 mg total) by mouth 2 (two) times daily. 30 tablet 0  . spironolactone (ALDACTONE) 25 MG tablet TAKE 1 TABLET BY MOUTH DAILY 90 tablet 3   No current facility-administered medications for this visit.     Allergies: Celebrex [celecoxib] and Tramadol    PHYSICAL EXAM: VS:  BP 126/78 (BP Location: Left Arm, Patient Position: Sitting, Cuff Size: Normal)   Pulse (!) 54   Ht 5\' 3"  (1.6 m)   Wt 147 lb 8 oz (66.9 kg)   BMI 26.13 kg/m  , Body mass index is 26.13 kg/m. Patient just took her pindolol.  Wt Readings from Last 3 Encounters:  02/16/17 147 lb 8 oz (  66.9 kg)  08/09/16 156 lb (70.8 kg)  03/14/16 163 lb (73.9 kg)  GENERAL:  well developed, well nourished, obese, not in acute distress HEENT: normocephalic, pink conjunctivae, anicteric sclerae, no xanthelasma, normal dentition, oropharynx clear NECK:  no neck vein engorgement, JVP normal, no hepatojugular reflux, carotid upstroke brisk and symmetric, no bruit, no thyromegaly, no lymphadenopathy LUNGS:  good respiratory effort, clear to auscultation bilaterally CV:  PMI not displaced, no thrills, no lifts, S1 and S2 within normal limits, no palpable S3 or S4, 3/6 systolic murmur, no rubs, no gallops ABD:  Soft, nontender, nondistended, normoactive bowel sounds, no abdominal aortic bruit, no hepatomegaly, no splenomegaly MS: nontender back, no kyphosis, no scoliosis, no joint deformities EXT:  2+ DP/PT  pulses, no edema, no varicosities, no cyanosis, no clubbing SKIN: warm, nondiaphoretic, normal turgor, no ulcers NEUROPSYCH: alert, oriented to person, place, and time, sensory/motor grossly intact, normal mood, appropriate affect    Recent Labs: 03/10/2016: ALT 10; BUN 28; Creatinine, Ser 1.30; Hemoglobin 11.5; Platelets 202.0; Potassium 4.4; Sodium 140   Lipid Panel    Component Value Date/Time   CHOL 199 03/10/2016 1128   TRIG 45.0 03/10/2016 1128   HDL 69.50 03/10/2016 1128   CHOLHDL 3 03/10/2016 1128   VLDL 9.0 03/10/2016 1128   LDLCALC 120 (H) 03/10/2016 1128   LDLDIRECT 104.4 11/26/2012 0746     Other studies Reviewed:  EKG:    EKG from 08/09/2016 was personally reviewed by me. Showed sinus bradycardic, 58 BPM. Nonspecific ST-T wave changes. No significant change from previous EKG    EKG is ordered today, 12/30/2015. Perspnally reviewed by me and it showed sinus bradycardia with sinus arrhythmia, 50 BPM. ST depression inferior and lateral leads. These ST changes are similar to EKG from 12/02/2015 and 09/23/2014.  The ekg 12/02/2015 was personally reviewed by me and it reveals sinus bradycardia 56 BPM,minimal ST depression  inferior and lateral leads. Findings are similar to EKG from 09/23/2014  EKG from 11/26/2015 12:52 pm showed sinus rhythm 62 bpm, nonspecific ST and T-wave abnormality.  Additional studies/ records that were reviewed personally by me today include:  Echo: 08/30/2010: Moderate LVH, EF 55-60%. Mild aortic stenosis. Mild aortic regurgitation. PHT 586 ms. Velocity ratio VTI of 0.62. Mean gradient 12. Peak gradient 25.Mild MR. Mild LA dilatation. Mild TR. Cath: February 2009 no CAD EF 70%  Echocardiogram 12/21/2015: Left ventricle: The cavity size was normal. There was mild focal  basal hypertrophy of the septum. Systolic function was normal.  The estimated ejection fraction was in the range of 55% to 60%.  Wall motion was normal; there were no regional  wall motion  abnormalities. Left ventricular diastolic function parameters  were normal. - Aortic valve: There was mild regurgitation. - Mitral valve: There was mild to moderate regurgitation. - Left atrium: The atrium was mildly dilated. - Pulmonary arteries: Systolic pressure was within the normal  range.   ASSESSMENT AND PLAN:   Hypertension BP is well controlled. Continue monitoring BP. Continue current medical therapy and lifestyle changes.   Paroxysmal AF s/p ablation March 2013 Patient has been compliant with products. No bleeding, no recent falls. Her heart rate has been on the lower side for a while but she has been asymptomatic. No lightheadedness or dizziness. No extreme fatigue.   Aortic regurgitation, mild  Continue to monitor with serial echo like the before next visit in 6 months.  Mitral regurgitation, mild to moderate  Blood pressure control recommended. Continue to serially evaluate, likely with  echo prior to next visit.  Hyperlipidemia  PCP following her for this. She is due for her blood work sometime in June.  Current medicines are reviewed at length with the patient today.  The patient does not have concerns regarding medicines.  Labs/ tests ordered today include:  Orders Placed This Encounter  Procedures  . EKG 12-Lead  . ECHOCARDIOGRAM COMPLETE    I had a lengthy and detailed discussion with the patient regarding diagnoses, prognosis, diagnostic options, treatment options, and side effects of medications.   I counseled the patient on importance of lifestyle modification including heart healthy diet, regular physical activity.   Disposition:   FU with Cardiology in 6 months.  I spent at least 25 minutes with the patient today and more than 50% of the time was spent counseling the patient and coordinating care.        Signed, Wende Bushy, MD  02/16/2017 3:23 PM    [place]

## 2017-03-27 ENCOUNTER — Ambulatory Visit
Admission: RE | Admit: 2017-03-27 | Discharge: 2017-03-27 | Disposition: A | Discharge status: Home / Self Care | Payer: Medicare Other | Source: Ambulatory Visit | Attending: Family Medicine | Admitting: Family Medicine

## 2017-03-27 DIAGNOSIS — N6489 Other specified disorders of breast: Secondary | ICD-10-CM | POA: Diagnosis not present

## 2017-03-27 DIAGNOSIS — R928 Other abnormal and inconclusive findings on diagnostic imaging of breast: Secondary | ICD-10-CM

## 2017-07-11 ENCOUNTER — Telehealth: Payer: Self-pay | Admitting: Family Medicine

## 2017-07-11 DIAGNOSIS — I10 Essential (primary) hypertension: Secondary | ICD-10-CM

## 2017-07-11 DIAGNOSIS — D508 Other iron deficiency anemias: Secondary | ICD-10-CM

## 2017-07-11 DIAGNOSIS — E782 Mixed hyperlipidemia: Secondary | ICD-10-CM

## 2017-07-11 NOTE — Telephone Encounter (Signed)
-----   Message from Eustace Pen, LPN sent at 91/11/2581  5:09 PM EDT ----- Regarding: Labs 10/3 Lab orders needed. Thank you.  [facility] Medicare

## 2017-07-12 ENCOUNTER — Ambulatory Visit (INDEPENDENT_AMBULATORY_CARE_PROVIDER_SITE_OTHER): Payer: Medicare Other

## 2017-07-12 ENCOUNTER — Encounter: Payer: Self-pay | Admitting: *Deleted

## 2017-07-12 VITALS — BP 130/82 | HR 55 | Temp 97.5°F | Ht 62.5 in | Wt 143.0 lb

## 2017-07-12 DIAGNOSIS — Z0000 Encounter for general adult medical examination without abnormal findings: Secondary | ICD-10-CM

## 2017-07-12 DIAGNOSIS — D508 Other iron deficiency anemias: Secondary | ICD-10-CM | POA: Diagnosis not present

## 2017-07-12 DIAGNOSIS — E782 Mixed hyperlipidemia: Secondary | ICD-10-CM

## 2017-07-12 LAB — CBC WITH DIFFERENTIAL/PLATELET
BASOS PCT: 0.6 % (ref 0.0–3.0)
Basophils Absolute: 0 10*3/uL (ref 0.0–0.1)
EOS PCT: 2 % (ref 0.0–5.0)
Eosinophils Absolute: 0.1 10*3/uL (ref 0.0–0.7)
HCT: 36.3 % (ref 36.0–46.0)
Hemoglobin: 11.8 g/dL — ABNORMAL LOW (ref 12.0–15.0)
LYMPHS ABS: 2 10*3/uL (ref 0.7–4.0)
Lymphocytes Relative: 30.6 % (ref 12.0–46.0)
MCHC: 32.5 g/dL (ref 30.0–36.0)
MCV: 94.9 fl (ref 78.0–100.0)
MONO ABS: 0.4 10*3/uL (ref 0.1–1.0)
MONOS PCT: 5.8 % (ref 3.0–12.0)
NEUTROS PCT: 61 % (ref 43.0–77.0)
Neutro Abs: 3.9 10*3/uL (ref 1.4–7.7)
Platelets: 222 10*3/uL (ref 150.0–400.0)
RBC: 3.83 Mil/uL — ABNORMAL LOW (ref 3.87–5.11)
RDW: 13.2 % (ref 11.5–15.5)
WBC: 6.5 10*3/uL (ref 4.0–10.5)

## 2017-07-12 LAB — COMPREHENSIVE METABOLIC PANEL
ALBUMIN: 4 g/dL (ref 3.5–5.2)
ALT: 9 U/L (ref 0–35)
AST: 15 U/L (ref 0–37)
Alkaline Phosphatase: 57 U/L (ref 39–117)
BUN: 21 mg/dL (ref 6–23)
CHLORIDE: 106 meq/L (ref 96–112)
CO2: 28 mEq/L (ref 19–32)
CREATININE: 1.35 mg/dL — AB (ref 0.40–1.20)
Calcium: 9.7 mg/dL (ref 8.4–10.5)
GFR: 49.16 mL/min — ABNORMAL LOW (ref >60.00)
GLUCOSE: 94 mg/dL (ref 70–99)
POTASSIUM: 4.2 meq/L (ref 3.5–5.1)
SODIUM: 141 meq/L (ref 135–145)
Total Bilirubin: 0.9 mg/dL (ref 0.2–1.2)
Total Protein: 7.6 g/dL (ref 6.0–8.3)

## 2017-07-12 LAB — LIPID PANEL
CHOLESTEROL: 203 mg/dL — AB (ref 0–200)
HDL: 66.8 mg/dL (ref >39.00)
LDL CALC: 125 mg/dL — AB (ref 0–99)
NONHDL: 136.13
Total CHOL/HDL Ratio: 3
Triglycerides: 58 mg/dL (ref 0.0–149.0)
VLDL: 11.6 mg/dL (ref 0.0–40.0)

## 2017-07-12 NOTE — Progress Notes (Signed)
Pre visit review using our clinic review tool, if applicable. No additional management support is needed unless otherwise documented below in the visit note. 

## 2017-07-12 NOTE — Progress Notes (Signed)
PCP notes:   Health maintenance:  Flu vaccine - addressed Tetanus vaccine - postponed/insurance  Abnormal screenings:   Mini-Cog score: 18/20  Depression score: 1 Depression screen [facility] 2/9 07/12/2017 03/10/2016 05/11/2015 04/17/2015 02/03/2015  Decreased Interest 0 0 0 0 0  Down, Depressed, Hopeless 0 0 0 0 0  PHQ - 2 Score 0 0 0 0 0  Altered sleeping 1 - - - -  Tired, decreased energy 0 - - - -  Change in appetite 0 - - - -  Feeling bad or failure about yourself  0 - - - -  Trouble concentrating 0 - - - -  Moving slowly or fidgety/restless 0 - - - -  Suicidal thoughts 0 - - - -  PHQ-9 Score 1 - - - -  Difficult doing work/chores Not difficult at all - - - -    Patient concerns:   Patient reports concern with a reddened area of skin on right foot. Skin is intact. Area is not raised or bumpy. Denies pain. Patient asked to monitor area and communicate any changes to PCP.   Nurse concerns:  None  Next PCP appt:   07/14/17 @ 1400

## 2017-07-12 NOTE — Progress Notes (Signed)
I reviewed health advisor's note, was available for consultation, and agree with documentation and plan.  

## 2017-07-12 NOTE — Progress Notes (Signed)
Subjective:   [name] is a 76 y.o. female who presents for Medicare Annual (Subsequent) preventive examination.  Review of Systems:  N/A Cardiac Risk Factors include: advanced age (>80men, >70 women);dyslipidemia;hypertension     Objective:     Vitals: BP 130/82 (BP Location: Right Arm, Patient Position: Sitting, Cuff Size: Normal)   Pulse (!) 55   Temp (!) 97.5 F (36.4 C) (Oral)   Ht 5' 2.5" (1.588 m) Comment: no shoes  Wt 143 lb (64.9 kg)   SpO2 99%   BMI 25.74 kg/m   Body mass index is 25.74 kg/m.   Tobacco History  Smoking Status  . Never Smoker  Smokeless Tobacco  . Never Used     Counseling given: No   Past Medical History:  Diagnosis Date  . Anemia   . AORTIC STENOSIS    a. echo 11/11: EF 55-60%, mod LVH, mild AS (mean 12), mild AI, mild MR, mild LAE;  b. cath 2/09: no CAD, EF 70%  . Atrial fibrillation ([place])    On Tikosyn  . Chest pain   . HYPERLIPIDEMIA   . HYPERTENSION   . OSTEOPENIA   . Pneumonia 2009  . RA (rheumatoid arthritis) ([place])   . SINUS BRADYCARDIA   . Unspecified glaucoma(365.9)    Past Surgical History:  Procedure Laterality Date  . ABLATION OF DYSRHYTHMIC FOCUS    . CARDIAC CATHETERIZATION    . COLONOSCOPY  04/18/08  . Partial hysterectomy--1979    . Thoracentesis   12/20/07     Family History  Problem Relation Age of Onset  . Diabetes Unknown   . Breast cancer Paternal Aunt 59  . Heart disease Mother   . Pancreatic cancer Father   . Atrial fibrillation Brother    History  Sexual Activity  . Sexual activity: No    Outpatient Encounter Prescriptions as of 07/12/2017  Medication Sig  . B Complex Vitamins (VITAMIN B COMPLEX) TABS Take 1 tablet by mouth daily.   . Calcium Carb-Cholecalciferol (CALCIUM 1000 + D) 1000-800 MG-UNIT TABS Take 1 tablet by mouth daily.  . dabigatran (PRADAXA) 150 MG CAPS Take 150 mg by mouth every 12 (twelve) hours.  . pindolol (VISKEN) 5 MG tablet Take 1 tablet (5 mg total) by mouth 2  (two) times daily.  Marland Kitchen spironolactone (ALDACTONE) 25 MG tablet TAKE 1 TABLET BY MOUTH DAILY   No facility-administered encounter medications on file as of 07/12/2017.     Activities of Daily Living In your present state of health, do you have any difficulty performing the following activities: 07/12/2017  Hearing? N  Vision? Y  Difficulty concentrating or making decisions? N  Walking or climbing stairs? N  Dressing or bathing? N  Doing errands, shopping? N  Preparing Food and eating ? N  Using the Toilet? N  In the past six months, have you accidently leaked urine? N  Do you have problems with loss of bowel control? N  Managing your Medications? N  Managing your Finances? N  Housekeeping or managing your Housekeeping? N  Some recent data might be hidden    Patient Care Team: [name], MD as PCP - Eusebio Friendly, MD as Referring Physician (Ophthalmology)    Assessment:     Hearing Screening   125Hz  250Hz  500Hz  1000Hz  2000Hz  3000Hz  4000Hz  6000Hz  8000Hz   Right ear:   40 40 40  40    Left ear:   40 40 40  40    Vision Screening  Comments: Last vision exam in Feb 2018 with Dr. George Ina   Exercise Activities and Dietary recommendations Current Exercise Habits: Home exercise routine, Type of exercise: stretching;strength training/weights;Other - see comments (cardio), Time (Minutes): 30, Frequency (Times/Week): 3, Weekly Exercise (Minutes/Week): 90, Intensity: Moderate, Exercise limited by: None identified  Goals    . Weight < 148 lb (67.132 kg)          Target weight is 147lbs. Starting 07/12/2017, I will continue to exercise for at least 30 min 3 days per week.       Fall Risk Fall Risk  07/12/2017 03/10/2016 05/11/2015 04/17/2015 02/07/2014  Falls in the past year? No No No No No   Depression Screen PHQ 2/9 Scores 07/12/2017 03/10/2016 05/11/2015 04/17/2015  PHQ - 2 Score 0 0 0 0  PHQ- 9 Score 1 - - -     Cognitive Function MMSE - Mini Mental State Exam 07/12/2017  03/10/2016  Orientation to time 5 5  Orientation to Place 5 5  Registration 3 3  Attention/ Calculation 0 0  Recall 1 3  Recall-comments unable to recall 2 of 3 words -  Language- name 2 objects 0 0  Language- repeat 1 1  Language- follow 3 step command 3 0  Language- follow 3 step command-comments - pt was unable to follow 3 steps to 3 step command  Language- read & follow direction 0 0  Write a sentence 0 0  Copy design 0 0  Total score 18 17     PLEASE NOTE: A Mini-Cog screen was completed. Maximum score is 20. A value of 0 denotes this part of [place] MMSE was not completed or the patient failed this part of the Mini-Cog screening.   Mini-Cog Screening Orientation to Time - Max 5 pts Orientation to Place - Max 5 pts Registration - Max 3 pts Recall - Max 3 pts Language Repeat - Max 1 pts Language Follow 3 Step Command - Max 3 pts     Immunization History  Administered Date(s) Administered  . Influenza Whole 08/05/2009  . Influenza, High Dose Seasonal PF 07/14/2015  . Influenza,inj,Quad PF,6+ Mos 08/25/2016  . PPD Test 05/01/2012, 04/17/2013  . Pneumococcal Conjugate-13 04/17/2015  . Pneumococcal Polysaccharide-23 10/11/2007  . Td 10/11/2003   Screening Tests Health Maintenance  Topic Date Due  . INFLUENZA VACCINE  01/07/2018 (Originally 05/10/2017)  . TETANUS/TDAP  10/10/2023 (Originally 10/10/2013)  . COLONOSCOPY  04/21/2018  . DEXA SCAN  Completed  . PNA vac Low Risk Adult  Completed      Plan:     I have personally reviewed and addressed the Medicare Annual Wellness questionnaire and have noted the following in the patient's chart:  A. Medical and social history B. Use of alcohol, tobacco or illicit drugs  C. Current medications and supplements D. Functional ability and status E.  Nutritional status F.  Physical activity G. Advance directives H. List of other physicians I.  Hospitalizations, surgeries, and ER visits in previous 12 months J.   [place] to include hearing, vision, cognitive, depression L. Referrals and appointments - none  In addition, I have reviewed and discussed with patient certain preventive protocols, quality metrics, and best practice recommendations. A written personalized care plan for preventive services as well as general preventive health recommendations were provided to patient.  See attached scanned questionnaire for additional information.   Signed,   Lindell Noe, MHA, BS, LPN Health Coach

## 2017-07-12 NOTE — Patient Instructions (Signed)
[name] , Thank you for taking time to come for your Medicare Wellness Visit. I appreciate your ongoing commitment to your health goals. Please review the following plan we discussed and let me know if I can assist you in the future.   These are the goals we discussed: Goals    . Weight < 148 lb (67.132 kg)          Target weight is 147lbs. Starting 07/12/2017, I will continue to exercise for at least 30 min 3 days per week.        This is a list of the screening recommended for you and due dates:  Health Maintenance  Topic Date Due  . Flu Shot  01/07/2018*  . Tetanus Vaccine  10/10/2023*  . Colon Cancer Screening  04/21/2018  . DEXA scan (bone density measurement)  Completed  . Pneumonia vaccines  Completed  *Topic was postponed. The date shown is not the original due date.   Preventive Care for Adults  A healthy lifestyle and preventive care can promote health and wellness. Preventive health guidelines for adults include the following key practices.  . A routine yearly physical is a good way to check with your health care provider about your health and preventive screening. It is a chance to share any concerns and updates on your health and to receive a thorough exam.  . Visit your dentist for a routine exam and preventive care every 6 months. Brush your teeth twice a day and floss once a day. Good oral hygiene prevents tooth decay and gum disease.  . The frequency of eye exams is based on your age, health, family medical history, use  of contact lenses, and other factors. Follow your health care provider's ecommendations for frequency of eye exams.  . Eat a healthy diet. Foods like vegetables, fruits, whole grains, low-fat dairy products, and lean protein foods contain the nutrients you need without too many calories. Decrease your intake of foods high in solid fats, added sugars, and salt. Eat the right amount of calories for you. Get information about a proper diet from your  health care provider, if necessary.  . Regular physical exercise is one of the most important things you can do for your health. Most adults should get at least 150 minutes of moderate-intensity exercise (any activity that increases your heart rate and causes you to sweat) each week. In addition, most adults need muscle-strengthening exercises on 2 or more days a week.  Silver Sneakers may be a benefit available to you. To determine eligibility, you may visit the website: www.silversneakers.com or contact program at [phone] Mon-Fri between 8AM-8PM.   . Maintain a healthy weight. The body mass index (BMI) is a screening tool to identify possible weight problems. It provides an estimate of body fat based on height and weight. Your health care provider can find your BMI and can help you achieve or maintain a healthy weight.   For adults 20 years and older: ? A BMI below 18.5 is considered underweight. ? A BMI of 18.5 to 24.9 is normal. ? A BMI of 25 to 29.9 is considered overweight. ? A BMI of 30 and above is considered obese.   . Maintain normal blood lipids and cholesterol levels by exercising and minimizing your intake of saturated fat. Eat a balanced diet with plenty of fruit and vegetables. Blood tests for lipids and cholesterol should begin at age 81 and be repeated every 5 years. If your lipid or cholesterol levels  are high, you are over 50, or you are at high risk for heart disease, you may need your cholesterol levels checked more frequently. Ongoing high lipid and cholesterol levels should be treated with medicines if diet and exercise are not working.  . If you smoke, find out from your health care provider how to quit. If you do not use tobacco, please do not start.  . If you choose to drink alcohol, please do not consume more than 2 drinks per day. One drink is considered to be 12 ounces (355 mL) of beer, 5 ounces (148 mL) of wine, or 1.5 ounces (44 mL) of liquor.  . If you are  59-64 years old, ask your health care provider if you should take aspirin to prevent strokes.  . Use sunscreen. Apply sunscreen liberally and repeatedly throughout the day. You should seek shade when your shadow is shorter than you. Protect yourself by wearing long sleeves, pants, a wide-brimmed hat, and sunglasses year round, whenever you are outdoors.  . Once a month, do a whole body skin exam, using a mirror to look at the skin on your back. Tell your health care provider of new moles, moles that have irregular borders, moles that are larger than a pencil eraser, or moles that have changed in shape or color.

## 2017-07-14 ENCOUNTER — Ambulatory Visit (INDEPENDENT_AMBULATORY_CARE_PROVIDER_SITE_OTHER): Payer: Medicare Other | Admitting: Family Medicine

## 2017-07-14 ENCOUNTER — Encounter: Payer: Self-pay | Admitting: Family Medicine

## 2017-07-14 VITALS — BP 130/72 | HR 53 | Temp 98.5°F | Ht 62.5 in | Wt 145.8 lb

## 2017-07-14 DIAGNOSIS — M069 Rheumatoid arthritis, unspecified: Secondary | ICD-10-CM

## 2017-07-14 DIAGNOSIS — B353 Tinea pedis: Secondary | ICD-10-CM | POA: Diagnosis not present

## 2017-07-14 DIAGNOSIS — Z0001 Encounter for general adult medical examination with abnormal findings: Secondary | ICD-10-CM

## 2017-07-14 DIAGNOSIS — I480 Paroxysmal atrial fibrillation: Secondary | ICD-10-CM | POA: Diagnosis not present

## 2017-07-14 DIAGNOSIS — Z0000 Encounter for general adult medical examination without abnormal findings: Secondary | ICD-10-CM

## 2017-07-14 DIAGNOSIS — E782 Mixed hyperlipidemia: Secondary | ICD-10-CM

## 2017-07-14 DIAGNOSIS — N183 Chronic kidney disease, stage 3 unspecified: Secondary | ICD-10-CM

## 2017-07-14 DIAGNOSIS — I10 Essential (primary) hypertension: Secondary | ICD-10-CM | POA: Diagnosis not present

## 2017-07-14 DIAGNOSIS — Z23 Encounter for immunization: Secondary | ICD-10-CM

## 2017-07-14 MED ORDER — CLOTRIMAZOLE 1 % EX CREA: 1.0000 "application " | TOPICAL_CREAM | Freq: Two times a day (BID) | CUTANEOUS | 0 refills | Status: DC

## 2017-07-14 NOTE — Patient Instructions (Addendum)
Start working on low cholesterol diet, increasing exercise, weight loss.  Treat fungal infection with topical cream..

## 2017-07-14 NOTE — Assessment & Plan Note (Signed)
Treat with topical antifungal.

## 2017-07-14 NOTE — Addendum Note (Signed)
Addended by: Carter Kitten on: 07/14/2017 03:02 PM   Modules accepted: Orders

## 2017-07-14 NOTE — Assessment & Plan Note (Signed)
Stable control, only slight worsening.

## 2017-07-14 NOTE — Progress Notes (Signed)
Subjective:    Patient ID: [name], female    DOB: 1940-11-25, 76 y.o.   MRN: [id number]  HPI  The patient presents for complete physical and review of chronic health problems. He/She also has the following acute concerns today: She does continue to have itchy red area,  Dry peeling skin On right medial .. Been presents in last week.  She has applied vaseline.  The patient saw Candis Musa, LPN for medicare wellness. Note reviewed in detail and important notes copied below. Health maintenance: Flu vaccine - addressed Tetanus vaccine - postponed/insurance Abnormal screenings:  Mini-Cog score: 18/20 Depression score: 1 Patient concerns:  Patient reports concern with a reddened area of skin on right foot. Skin is intact. Area is not raised or bumpy. Denies pain. Patient asked to monitor area and communicate any changes to PCP.   Today  07/14/17   RA:  She state she has good control.. Joints are not tender.. Does not wish to return to see rheumatologist.  Elevated Cholesterol:  Inadequate control .Marland Kitchen Statin indicated. Lab Results  Component Value Date   CHOL 203 (H) 07/12/2017   HDL 66.80 07/12/2017   LDLCALC 125 (H) 07/12/2017   LDLDIRECT 104.4 11/26/2012   TRIG 58.0 07/12/2017   CHOLHDL 3 07/12/2017  Using medications without problems: Muscle aches:  Diet compliance: good Exercise: three times a week and also walks Other complaints:  Hypertension: Good control of BP  On current regimen Using medication without problems or lightheadedness:  none Chest pain with exertion: none Edema:none Short of breath: none Average home BPs: Other issues:   Parosysmal afib: Pradaxa for anticoagulation, pindolol for rate control    CKD, stable: moderate amount of fluids. On no NSAIDs.  Social History /Family History/Past Medical History reviewed in detail and updated in EMR if needed. Height 5' 2.5" (1.588 m). Blood pressure 130/72, pulse (!) 53, temperature 98.5 F  (36.9 C), temperature source Oral, height 5' 2.5" (1.588 m), weight 145 lb 12 oz (66.1 kg). Body mass index is 26.23 kg/m.   Review of Systems  Constitutional: Negative for fatigue and fever.  HENT: Negative for ear pain.   Eyes: Negative for pain.  Respiratory: Negative for chest tightness and shortness of breath.   Cardiovascular: Negative for chest pain, palpitations and leg swelling.  Gastrointestinal: Negative for abdominal pain.  Genitourinary: Negative for dysuria.       Objective:   Physical Exam  Constitutional: Vital signs are normal. She appears well-developed and well-nourished. She is cooperative.  Non-toxic appearance. She does not appear ill. No distress.  HENT:  Head: Normocephalic.  Right Ear: Hearing, tympanic membrane, external ear and ear canal normal.  Left Ear: Hearing, tympanic membrane, external ear and ear canal normal.  Nose: Nose normal.  Eyes: Pupils are equal, round, and reactive to light. Conjunctivae, EOM and lids are normal. Lids are everted and swept, no foreign bodies found.  Neck: Trachea normal and normal range of motion. Neck supple. Carotid bruit is not present. No thyroid mass and no thyromegaly present.  Cardiovascular: Normal rate, regular rhythm, S1 normal, S2 normal, normal heart sounds and intact distal pulses.  Exam reveals no gallop.   No murmur heard. Pulmonary/Chest: Effort normal and breath sounds normal. No respiratory distress. She has no wheezes. She has no rhonchi. She has no rales.  Abdominal: Soft. Normal appearance and bowel sounds are normal. She exhibits no distension, no fluid wave, no abdominal bruit and no mass. There is no hepatosplenomegaly. There  is no tenderness. There is no rebound, no guarding and no CVA tenderness. No hernia.  Lymphadenopathy:    She has no cervical adenopathy.    She has no axillary adenopathy.  Neurological: She is alert. She has normal strength. No cranial nerve deficit or sensory deficit.    Skin: Skin is warm, dry and intact. No rash noted.  Itchy flaky skin on right foot, erythematous, leading edge.  Psychiatric: Her speech is normal and behavior is normal. Judgment normal. Her mood appears not anxious. Cognition and memory are normal. She does not exhibit a depressed mood.          Assessment & Plan:  The patient's preventative maintenance and recommended screening tests for an annual wellness exam were reviewed in full today. Brought up to date unless services declined.  Counselled on the importance of diet, exercise, and its role in overall health and mortality. The patient's FH and SH was reviewed, including their home life, tobacco status, and drug and alcohol status.   Osteopenia: last DEXA 2016, stable, osteopenia  repeat planned in 5 years  Colonoscopy: 2009 Dr.Jacobs, repeat in 2019  Partial Hysterectomy: no pap indicated, no DVE indicated. No family or personal history of ovarian cancer, asymptomatic.  Vaccines: Uptodate, flu given today,Postopned zostavax, tdap. Mammogram: 03/2017 nml Nonsmoker  No ETOH, no drugs

## 2017-07-14 NOTE — Assessment & Plan Note (Signed)
Refuses referral to rheum as very well controlle don no meds.

## 2017-07-14 NOTE — Assessment & Plan Note (Addendum)
LDL above goal and increased CVD risk.  Statin indicated. CVD 10 year risk 16%  Pt wants to work on lifestyle changes first.

## 2017-07-14 NOTE — Assessment & Plan Note (Signed)
Followed by cardiology 

## 2017-09-01 ENCOUNTER — Telehealth: Payer: Self-pay | Admitting: Physician Assistant

## 2017-09-01 ENCOUNTER — Other Ambulatory Visit: Payer: Self-pay | Admitting: Physician Assistant

## 2017-09-01 MED ORDER — PINDOLOL 5 MG PO TABS: 5.0000 mg | ORAL_TABLET | Freq: Two times a day (BID) | ORAL | 0 refills | Status: DC

## 2017-09-01 NOTE — Telephone Encounter (Signed)
     Patient called because she had taken her last pindolol tablet today. She has Pradaxa and states will not run out of it. Patient states that she has an appointment with [place] in [place], but I do not see that, looking at her appointments.  Previous pindolol prescription in our records was February 2017 and was a 2-week supply. Patient stated she had been followed at [facility] and had an ablation and she is now following up with cardiology in [place], closer to her home.  Gave her a 30-day prescription for pindolol. Requested that she call the [facility] office on Monday to make a follow-up appointment with MD or PA/NP. Patient and daughter agree.  Rosaria Ferries, PA-C 09/01/2017 4:08 PM Beeper [phone]

## 2017-09-13 ENCOUNTER — Encounter: Payer: Self-pay | Admitting: Physician Assistant

## 2017-09-13 NOTE — Progress Notes (Signed)
Cardiology Office Note Date:  09/14/2017  Patient ID:  [name], [name] 06-29-1941, MRN [id number] PCP:  [name], MD  Cardiologist:  Former [facility] patient    Chief Complaint: Follow up Afib  History of Present Illness: [name] is a 76 y.o. female with history of nonobstructive CAD by [place] in 11/2007, PAF s/p ablation in 12/2011 on Pradaxa and pindolol, asymptomatic bradycardia, mitral regurgitation, aortic insufficiency, CKD stage II, HTN, and HLD who presents for follow up of her Afib.   She was previously followed by [facility], though has since established [place] to be closer to home. She was previously followed by Dr. Yvone Neu and was last seen by [place] on 02/16/2017. Prior echo on 08/30/2010 showed EF 55-60%, moderate LVH, mild aortic stenosis, mild AI, mildly dilated left atrium, mild TR. Echo from 2014 showed EF 60-65%, DD, mild MR, dilated left atrium, aortic sclerosis with mild AI, mild pulmonary hypertension, moderte TR, mildly dilated right atrium. Most recent echo from 12/21/2015 showed EF 55-60%, normal wall motion, normal LV diastolic function, mild AI, mild to moderate MR, mildly dilated left atrium, PASP normal. She called the answering service on 11/23 needing a refill of her pindolol. This was refilled and an appointment was made.  She comes in doing well today. She is tolerating all medications without issues. BP at home runs in the 532D to 924Q systolic. She is asymptomatic from her bradycardia. PCP follows her lipids and CKD. She stays active by watching her two year old grandson. No chest pain, palpitations, SOB, diaphoresis, dizziness, presyncope, or syncope. No concerns today.    Past Medical History:  Diagnosis Date  . Anemia   . Aortic insufficiency    a. echo 11/11: EF 55-60%, mod LVH, mild AS (mean 12), mild AI, mild MR, mild LAE  . Coronary artery disease, non-occlusive    a. cath 2/09: no CAD, EF 70%  . HYPERLIPIDEMIA   . HYPERTENSION   .  OSTEOPENIA   . PAF (paroxysmal atrial fibrillation) ([place])    a. s/p ablation 2013; b. on pradaxa; c. CHADS2VASc => 4 (HTN, age x 2, female)  . Pneumonia 2009  . RA (rheumatoid arthritis) ([place])   . SINUS BRADYCARDIA    a. asymptomatic   . Unspecified glaucoma(365.9)     Past Surgical History:  Procedure Laterality Date  . ABLATION OF DYSRHYTHMIC FOCUS    . CARDIAC CATHETERIZATION    . COLONOSCOPY  04/18/08  . Partial hysterectomy--1979    . Thoracentesis   12/20/07      Current Meds  Medication Sig  . B Complex Vitamins (VITAMIN B COMPLEX) TABS Take 1 tablet by mouth daily.   . Calcium Carb-Cholecalciferol (CALCIUM 1000 + D) 1000-800 MG-UNIT TABS Take 1 tablet by mouth daily.  . clotrimazole (LOTRIMIN) 1 % cream Apply 1 application topically 2 (two) times daily.  . dabigatran (PRADAXA) 150 MG CAPS Take 150 mg by mouth every 12 (twelve) hours.  . pindolol (VISKEN) 5 MG tablet Take 1 tablet (5 mg total) by mouth 2 (two) times daily.  Marland Kitchen spironolactone (ALDACTONE) 25 MG tablet TAKE 1 TABLET BY MOUTH DAILY    Allergies:   Celecoxib and Tramadol   Social History:  The patient  reports that  has never smoked. she has never used smokeless tobacco. She reports that she does not drink alcohol or use drugs.   Family History:  The patient's family history includes Atrial fibrillation in her brother; Breast cancer (age  of onset: 47) in her paternal aunt; Diabetes in her unknown relative; Heart disease in her mother; Pancreatic cancer in her father.  ROS:   Review of Systems  Constitutional: Negative for chills, diaphoresis, fever, malaise/fatigue and weight loss.  HENT: Negative for congestion.   Eyes: Negative for discharge and redness.  Respiratory: Negative for cough, hemoptysis, sputum production, shortness of breath and wheezing.   Cardiovascular: Negative for chest pain, palpitations, orthopnea, claudication, leg swelling and PND.  Gastrointestinal: Negative for abdominal pain, blood  in stool, heartburn, melena, nausea and vomiting.  Genitourinary: Negative for hematuria.  Musculoskeletal: Negative for falls and myalgias.  Skin: Negative for rash.  Neurological: Negative for dizziness, tingling, tremors, sensory change, speech change, focal weakness, loss of consciousness and weakness.  Endo/Heme/Allergies: Does not bruise/bleed easily.  Psychiatric/Behavioral: Negative for substance abuse. The patient is not nervous/anxious.   All other systems reviewed and are negative.    PHYSICAL EXAM:  VS:  BP 110/70 (BP Location: Left Arm, Patient Position: Sitting, Cuff Size: Normal)   Pulse (!) 54   Ht 5\' 2"  (1.575 m)   Wt 143 lb 12 oz (65.2 kg)   BMI 26.29 kg/m  BMI: Body mass index is 26.29 kg/m.  Physical Exam  Constitutional: She is oriented to person, place, and time. She appears well-developed and well-nourished.  HENT:  Head: Normocephalic and atraumatic.  Eyes: Right eye exhibits no discharge. Left eye exhibits no discharge.  Neck: Normal range of motion. No JVD present.  Cardiovascular: Normal rate, regular rhythm, S1 normal and S2 normal. Exam reveals no distant heart sounds, no friction rub, no midsystolic click and no opening snap.  Murmur heard. High-pitched blowing holosystolic murmur is present with a grade of 2/6 at the apex. Pulses:      Posterior tibial pulses are 2+ on the right side, and 2+ on the left side.  Pulmonary/Chest: Effort normal and breath sounds normal. No respiratory distress. She has no decreased breath sounds. She has no wheezes. She has no rales. She exhibits no tenderness.  Abdominal: Soft. She exhibits no distension. There is no tenderness.  Musculoskeletal: She exhibits no edema.  Neurological: She is alert and oriented to person, place, and time.  Skin: Skin is warm and dry. No cyanosis. Nails show no clubbing.  Psychiatric: She has a normal mood and affect. Her speech is normal and behavior is normal. Judgment and thought content  normal.      EKG:  Was ordered and interpreted by me today. Shows sinus bradycardia, 54 bpm, nonspecific st/t changes   Recent Labs: 07/12/2017: ALT 9; BUN 21; Creatinine, Ser 1.35; Hemoglobin 11.8; Platelets 222.0; Potassium 4.2; Sodium 141  07/12/2017: Cholesterol 203; HDL 66.80; LDL Cholesterol 125; Total CHOL/HDL Ratio 3; Triglycerides 58.0; VLDL 11.6   CrCl cannot be calculated (Patient's most recent lab result is older than the maximum 21 days allowed.).   Wt Readings from Last 3 Encounters:  09/14/17 143 lb 12 oz (65.2 kg)  07/14/17 145 lb 12 oz (66.1 kg)  07/12/17 143 lb (64.9 kg)     Other studies reviewed: Additional studies/records reviewed today include: summarized above  ASSESSMENT AND PLAN:  1. Nonobstructive CAD: NO symptoms concerning for angina at this time. On Pradaxa in place of ASA. Aggressive risk factor modification. Discussed with patient a trial of statin given her elevated LDL, she declines at this time.   2. PAF s/p ablation: Remains in sinus rhythm with mildly bradycardic heart rate. Continue Pradaxa 150 mg bid. CHADS2VASc  at least 4 (HTN, age x 2, female). Labs up to date.  3. Asymptomatic bradycardia: Continue current dose of pindolol.   4. Valvular heart disease: Asymptomatic. No diastolic murmur heard. Check echo to evaluate for stability in mitral regurgitation.   5. HTN: Well controlled. Continue current medication.   6. HLD: LDL elevated at 125. Declines statin. Lifestyle modification recommended. Followed by PCP.  7. CKD stage II: Appears stable. Followed by PCP.  Disposition: F/u with [name] in 6 months.   Current medicines are reviewed at length with the patient today.  The patient did not have any concerns regarding medicines.  Melvern Banker PA-C 09/14/2017 1:29 PM     [address] [place], [place] [address] [phone]

## 2017-09-14 ENCOUNTER — Ambulatory Visit: Payer: Medicare Other | Admitting: Physician Assistant

## 2017-09-14 ENCOUNTER — Encounter: Payer: Self-pay | Admitting: Physician Assistant

## 2017-09-14 VITALS — BP 110/70 | HR 54 | Ht 62.0 in | Wt 143.8 lb

## 2017-09-14 DIAGNOSIS — N182 Chronic kidney disease, stage 2 (mild): Secondary | ICD-10-CM

## 2017-09-14 DIAGNOSIS — I10 Essential (primary) hypertension: Secondary | ICD-10-CM | POA: Diagnosis not present

## 2017-09-14 DIAGNOSIS — Z9889 Other specified postprocedural states: Secondary | ICD-10-CM | POA: Diagnosis not present

## 2017-09-14 DIAGNOSIS — I340 Nonrheumatic mitral (valve) insufficiency: Secondary | ICD-10-CM | POA: Diagnosis not present

## 2017-09-14 DIAGNOSIS — I2510 Atherosclerotic heart disease of native coronary artery without angina pectoris: Secondary | ICD-10-CM | POA: Diagnosis not present

## 2017-09-14 DIAGNOSIS — I480 Paroxysmal atrial fibrillation: Secondary | ICD-10-CM | POA: Insufficient documentation

## 2017-09-14 DIAGNOSIS — E782 Mixed hyperlipidemia: Secondary | ICD-10-CM

## 2017-09-14 DIAGNOSIS — Z8679 Personal history of other diseases of the circulatory system: Secondary | ICD-10-CM | POA: Insufficient documentation

## 2017-09-14 DIAGNOSIS — I495 Sick sinus syndrome: Secondary | ICD-10-CM

## 2017-09-14 MED ORDER — SPIRONOLACTONE 25 MG PO TABS: 25.0000 mg | ORAL_TABLET | Freq: Every day | ORAL | 3 refills | Status: DC

## 2017-09-14 NOTE — Patient Instructions (Signed)
Medication Instructions:  Your physician recommends that you continue on your current medications as directed. Please refer to the Current Medication list given to you today.   Labwork: none  Testing/Procedures: Your physician has requested that you have an echocardiogram. Echocardiography is a painless test that uses sound waves to create images of your heart. It provides your doctor with information about the size and shape of your heart and how well your heart's chambers and valves are working. This procedure takes approximately one hour. There are no restrictions for this procedure.    Follow-Up: Your physician wants you to follow-up in: 6 months with [name]. You will receive a reminder letter in the mail two months in advance. If you don't receive a letter, please call our office to schedule the follow-up appointment.   Any Other Special Instructions Will Be Listed Below (If Applicable).     If you need a refill on your cardiac medications before your next appointment, please call your pharmacy.

## 2017-09-27 ENCOUNTER — Telehealth: Payer: Self-pay | Admitting: Cardiology

## 2017-09-27 ENCOUNTER — Other Ambulatory Visit: Payer: Self-pay | Admitting: Cardiology

## 2017-09-27 MED ORDER — DABIGATRAN ETEXILATE MESYLATE 150 MG PO CAPS: 150.0000 mg | ORAL_CAPSULE | Freq: Two times a day (BID) | ORAL | 5 refills | Status: DC

## 2017-09-27 NOTE — Telephone Encounter (Signed)
I called [facility] to check on pt Rx for Pradaxa and they have received it.

## 2017-09-27 NOTE — Telephone Encounter (Signed)
Pt called answering service to report that she is out of her blood thinner. I called her and she says that she has been unable to get her Pradaxa as it was ordered from [facility] and she has recently moved her cardiology service to [place]. She has no more doses. Last note by Christell Faith on 09/16/17 says continue Pradaxa. I have sent a new prescription in to [facility] in [place]. I have told her that I leave at 8pm and to call me back if [facility] did not receive the prescription. She agrees.   Daune Perch, AGNP-C [facility] 09/27/2017  7:13 PM Pager: [phone]

## 2017-09-29 ENCOUNTER — Telehealth: Payer: Self-pay

## 2017-09-29 ENCOUNTER — Other Ambulatory Visit: Payer: Medicare Other

## 2017-09-29 NOTE — Telephone Encounter (Signed)
Spoke with Mia at [phone] regarding a a prior authorization on Pradaxa 150 mg twice a day.  This is actually a tier exemption request for a cheaper co-pay. The patient has already picked up the Rx for Pradaxa at the local pharmacy she paid $120.00.  We should receive an approval within the next 24-48 hours.   Pt. ID# [id number] Ref # for Tier exemption is [id number].

## 2017-10-02 NOTE — Telephone Encounter (Signed)
Fax received back from [facility] stating PA was incomplete.  Eliquis/ Xarelto are tier 3. Pradaxa is tier 4. Paperwork was asking for medical reasons why the patient needed to be on pradaxa and could not use the formulary alternatives. Pradaxa originally started by another cardiology practice- Dr. Barbaraann Barthel. I do not see that the patient has tried eliquis/ xarelto.  I also do not see that she has had any issues with Pradaxa. Paperwork completed and faxed back to [employer] stating such- [phone].  Confirmation received.

## 2017-10-05 ENCOUNTER — Ambulatory Visit (INDEPENDENT_AMBULATORY_CARE_PROVIDER_SITE_OTHER): Payer: Medicare Other

## 2017-10-05 ENCOUNTER — Other Ambulatory Visit: Payer: Self-pay

## 2017-10-05 DIAGNOSIS — I340 Nonrheumatic mitral (valve) insufficiency: Secondary | ICD-10-CM

## 2017-10-16 ENCOUNTER — Ambulatory Visit: Payer: Medicare Other | Admitting: Cardiovascular Disease

## 2017-10-26 ENCOUNTER — Other Ambulatory Visit: Payer: Self-pay | Admitting: Physician Assistant

## 2017-11-28 DIAGNOSIS — H26491 Other secondary cataract, right eye: Secondary | ICD-10-CM | POA: Diagnosis not present

## 2018-01-30 ENCOUNTER — Encounter: Payer: Self-pay | Admitting: Emergency Medicine

## 2018-01-30 ENCOUNTER — Other Ambulatory Visit: Payer: Self-pay

## 2018-01-30 DIAGNOSIS — I129 Hypertensive chronic kidney disease with stage 1 through stage 4 chronic kidney disease, or unspecified chronic kidney disease: Secondary | ICD-10-CM | POA: Insufficient documentation

## 2018-01-30 DIAGNOSIS — R51 Headache: Secondary | ICD-10-CM | POA: Insufficient documentation

## 2018-01-30 DIAGNOSIS — I2510 Atherosclerotic heart disease of native coronary artery without angina pectoris: Secondary | ICD-10-CM | POA: Insufficient documentation

## 2018-01-30 DIAGNOSIS — H538 Other visual disturbances: Secondary | ICD-10-CM | POA: Insufficient documentation

## 2018-01-30 DIAGNOSIS — Z79899 Other long term (current) drug therapy: Secondary | ICD-10-CM | POA: Diagnosis not present

## 2018-01-30 DIAGNOSIS — N183 Chronic kidney disease, stage 3 (moderate): Secondary | ICD-10-CM | POA: Diagnosis not present

## 2018-01-30 NOTE — ED Triage Notes (Addendum)
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

## 2018-01-31 ENCOUNTER — Emergency Department
Admission: EM | Admit: 2018-01-31 | Discharge: 2018-01-31 | Disposition: A | Discharge status: Home / Self Care | Payer: Medicare Other | Attending: Emergency Medicine | Admitting: Emergency Medicine

## 2018-01-31 ENCOUNTER — Emergency Department: Payer: Medicare Other

## 2018-01-31 DIAGNOSIS — R519 Headache, unspecified: Secondary | ICD-10-CM

## 2018-01-31 DIAGNOSIS — H5711 Ocular pain, right eye: Secondary | ICD-10-CM | POA: Diagnosis not present

## 2018-01-31 DIAGNOSIS — R51 Headache: Secondary | ICD-10-CM

## 2018-01-31 DIAGNOSIS — H538 Other visual disturbances: Secondary | ICD-10-CM

## 2018-01-31 IMAGING — CT CT HEAD W/O CM
3 series · 16 of 46 positions shown, 19 images · non-contrast
Comparison: CT HEAD [DATE]

CLINICAL DATA: LEFT headache, vision changes and eye irritation.
History of hypertension, hyperlipidemia.

EXAM:
CT HEAD WITHOUT CONTRAST
TECHNIQUE: Contiguous axial images were obtained from the base of the skull
through the vertex without intravenous contrast.

[Series 3: head wo · axial · 0.40mm/px · z∈[-77,+43]mm · 10 of 29 slices shown, 13 images]
[im 3/29  brain]
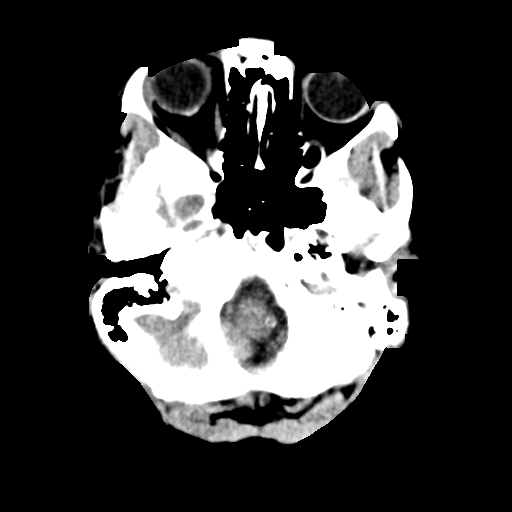
[im 3/29  bone]
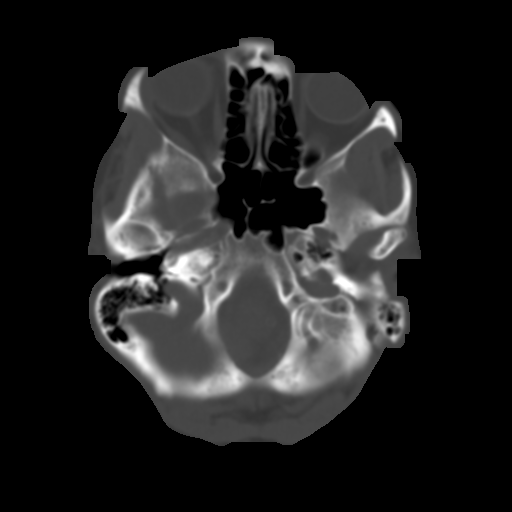
[im 6/29  brain]
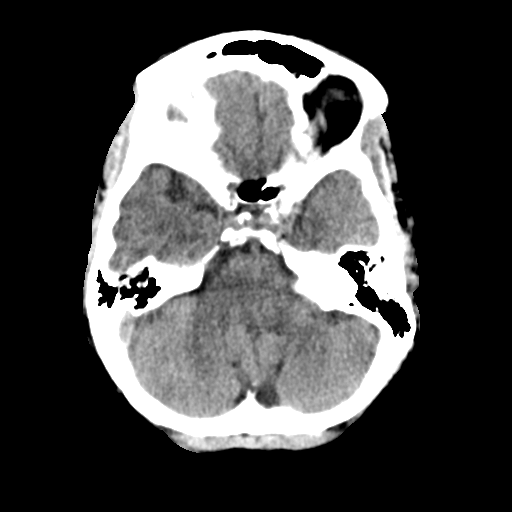
[im 8/29  brain]
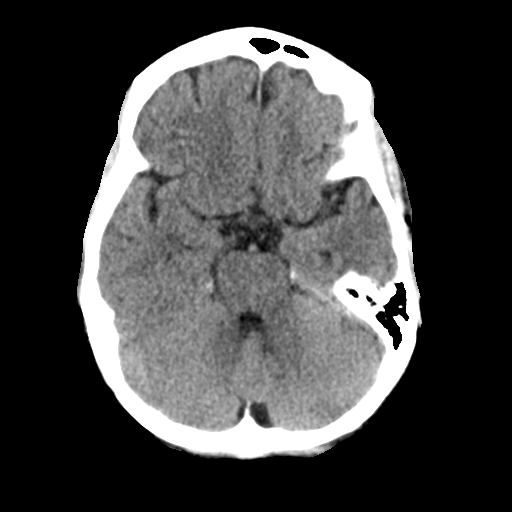
[im 11/29  brain]
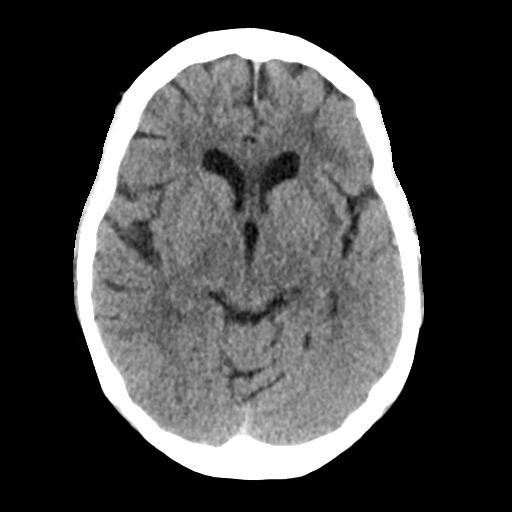
[im 14/29  brain]
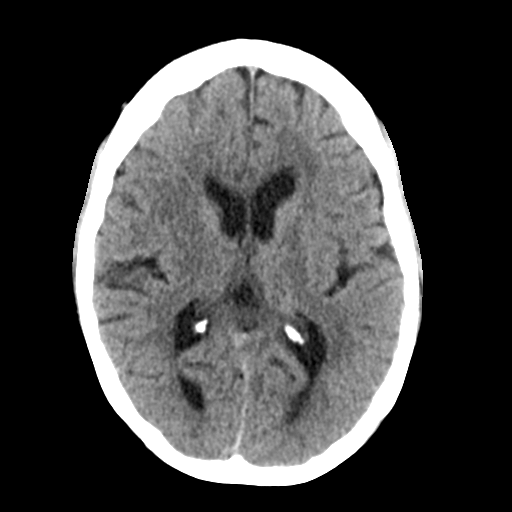
[im 14/29  bone]
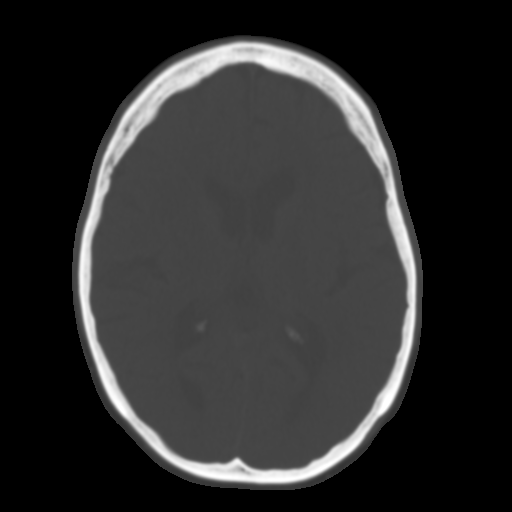
[im 16/29  brain]
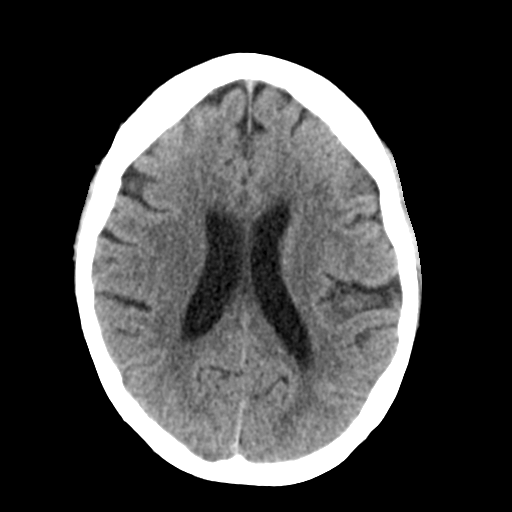
[im 19/29  brain]
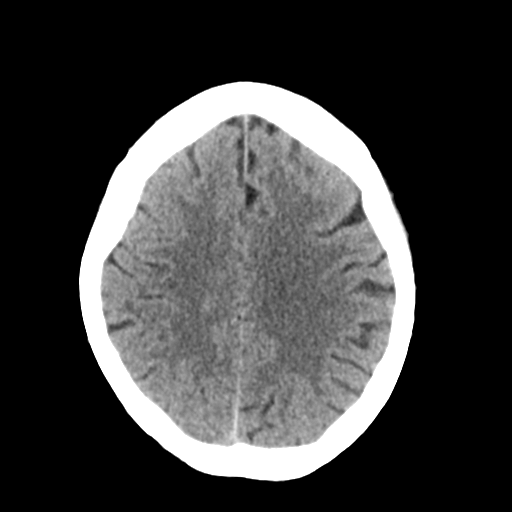
[im 22/29  brain]
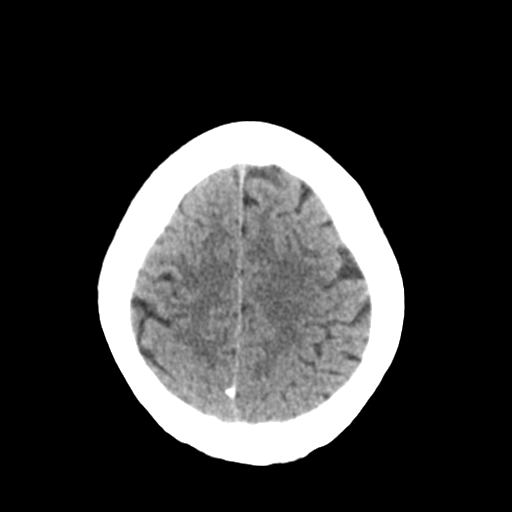
[im 24/29  brain]
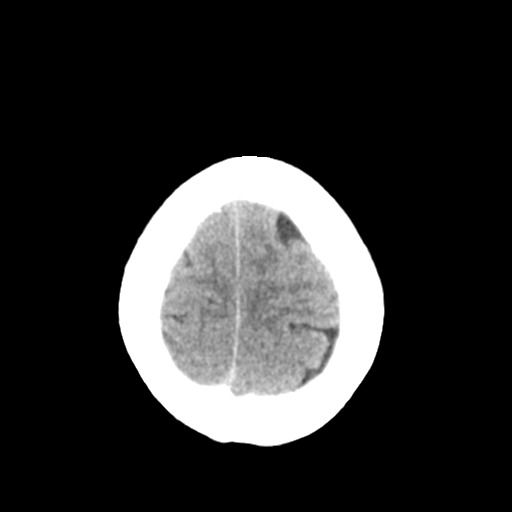
[im 24/29  bone]
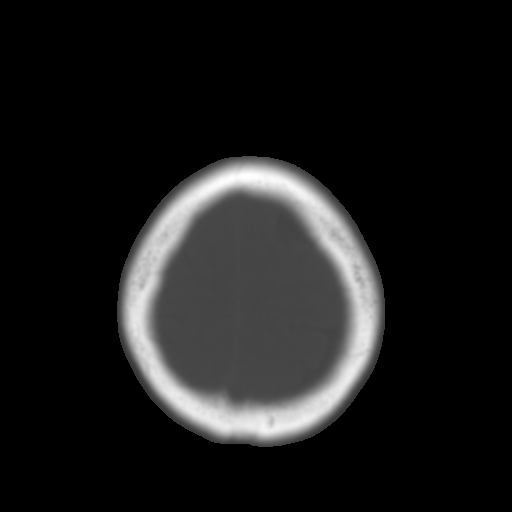
[im 27/29  brain]
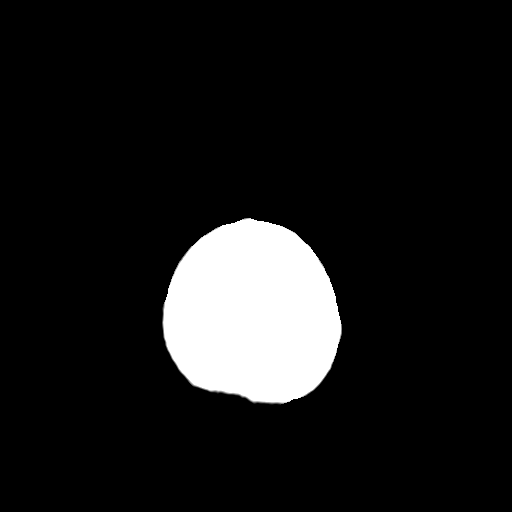

[Series 4: coronal soft tissue · coronal · 0.29mm/px · 3 of 62 slices shown]
[im 21/62  brain]
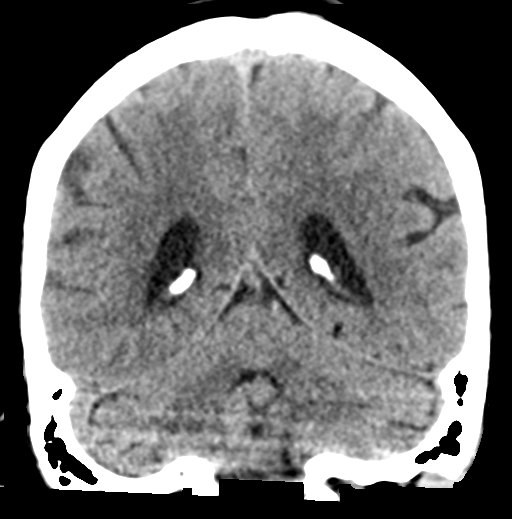
[im 28/62  brain]
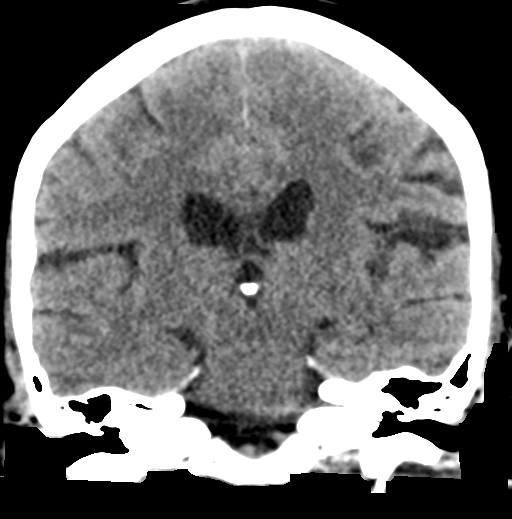
[im 34/62  brain]
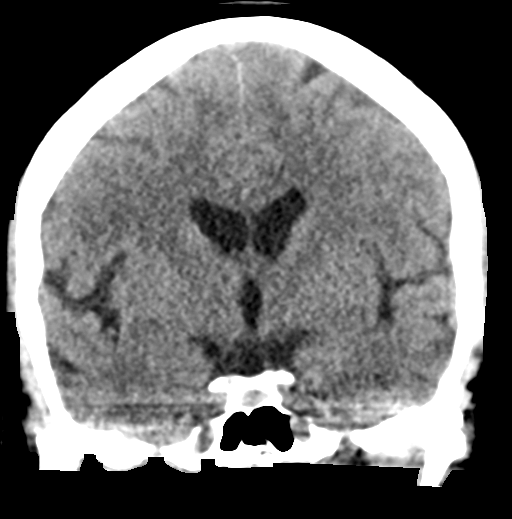

[Series 5: sagittal soft tissue · sagittal · 0.29mm/px · 3 of 49 slices shown]
[im 17/49  brain]
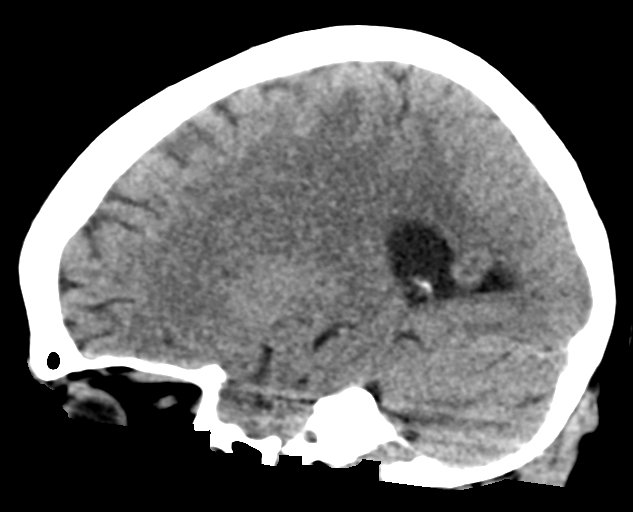
[im 25/49  brain]
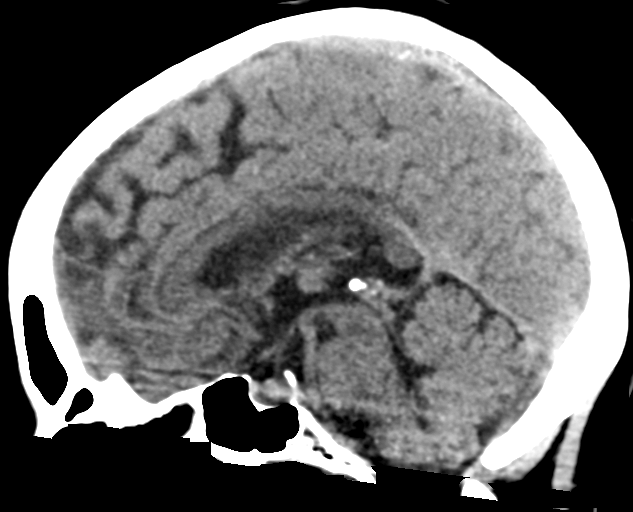
[im 33/49  brain]
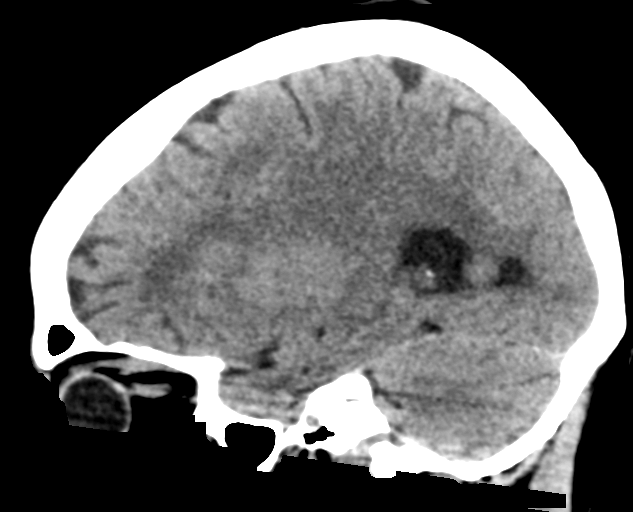

[16 of 46 positions shown; findings below may reference images not displayed]

FINDINGS: BRAIN: No intraparenchymal hemorrhage, mass effect nor midline
shift. The ventricles and sulci are normal for age. Patchy
supratentorial white matter hypodensities less than expected for
patient's age, though non-specific are most compatible with chronic
small vessel ischemic disease. No acute large vascular territory
infarcts. No abnormal extra-axial fluid collections. Basal cisterns
are patent.

VASCULAR: Mild calcific atherosclerosis of the carotid siphons.

SKULL: No skull fracture. No significant scalp soft tissue swelling.

SINUSES/ORBITS: The mastoid air-cells and included paranasal sinuses
are well-aerated.The included ocular globes and orbital contents are
non-suspicious. Status post RIGHT ocular lens implant.

OTHER: None.
IMPRESSION: Normal noncontrast CT HEAD for age.

## 2018-01-31 MED ORDER — ACETAMINOPHEN 500 MG PO TABS
1000.0000 mg | ORAL_TABLET | Freq: Once | ORAL | Status: AC
  Administered 2018-01-31: 1000 mg via ORAL
  Filled 2018-01-31: qty 2

## 2018-01-31 NOTE — Discharge Instructions (Signed)
You have been seen in the Emergency Department (ED) for a headache.  Please use Tylenol or Motrin as needed for symptoms, but only as written on the box.   As we have discussed, please follow up with your ophthalmologist and your primary care doctor as soon as possible regarding today?s Emergency Department (ED) visit and your headache symptoms.    Call your doctor or return to the ED if you have a worsening headache, sudden and severe headache, confusion, slurred speech, facial droop, weakness or numbness in any arm or leg, extreme fatigue, vision problems, or other symptoms that concern you.

## 2018-01-31 NOTE — ED Provider Notes (Signed)
[facility] Emergency Department Provider Note  ____________________________________________   First MD Initiated Contact with Patient 01/31/18 [phone]     (approximate)  I have reviewed the triage vital signs and the nursing notes.   HISTORY  Chief Complaint Headache and Eye Problem    HPI [name] is a 77 y.o. female with medical history as listed below who presents for evaluation of a combination symptoms.  She reports that she had acute onset of right-sided headache that was severe at its worst but is calm down to being mild at this time.  She is also concerned because there is a prominent vein on the right side of her forehead that she has not noticed before and it is sticking out.  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  She has not had any traumatic injury recently.  She has not had any excessive amount of discharge or purulence coming from the eye.  She denies fever/chills, chest pain, shortness of breath, nausea, vomiting, and abdominal pain.  Light does not make the pain worse.  She always has some blurry vision in the right eye which is baseline.  She saw Dr. George Ina within the last few weeks or months at the [facility] and she was told that he could take her for additional surgery but is unclear if this would be helpful.  Overall she describes the symptoms as moderate and she is in no acute distress at this time.  Past Medical History:  Diagnosis Date  . Anemia   . Aortic insufficiency    a. echo 11/11: EF 55-60%, mod LVH, mild AS (mean 12), mild AI, mild MR, mild LAE  . Coronary artery disease, non-occlusive    a. cath 2/09: no CAD, EF 70%  . HYPERLIPIDEMIA   . HYPERTENSION   . OSTEOPENIA   . PAF (paroxysmal atrial fibrillation) ([place])    a. s/p ablation 2013; b. on  pradaxa; c. CHADS2VASc => 4 (HTN, age x 2, female)  . Pneumonia 2009  . RA (rheumatoid arthritis) ([place])   . SINUS BRADYCARDIA    a. asymptomatic   . Unspecified glaucoma(365.9)     Patient Active Problem List   Diagnosis Date Noted  . Coronary artery disease, non-occlusive 09/14/2017  . PAF (paroxysmal atrial fibrillation) ([place]) 09/14/2017  . Status post ablation of atrial fibrillation 09/14/2017  . Essential hypertension 09/14/2017  . Tinea pedis 07/14/2017  . 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/15/2016  . CKD (chronic kidney disease) stage 3, GFR 30-59 ml/min (HCC) 03/14/2016  . Rheumatoid arthritis ([place]) 03/14/2016  . Mitral regurgitation 12/30/2015  . Obesity 12/02/2015  . Aortic valve insufficiency 12/02/2015  . Vitamin D deficiency 04/17/2015  . Counseling regarding end of life decision making 04/17/2015  . Allergic rhinitis 02/03/2015  . Aortic valve stenosis, mild 08/19/2010  . Osteopenia 05/11/2010  . SINUS BRADYCARDIA 08/19/2009  . Anemia, iron deficiency 12/13/2007  . HYPERLIPIDEMIA 08/23/2007  . UNSPECIFIED GLAUCOMA 10/10/2006    Past Surgical History:  Procedure Laterality Date  . ABLATION OF DYSRHYTHMIC FOCUS    . CARDIAC CATHETERIZATION    . COLONOSCOPY  04/18/08  . Partial hysterectomy--1979    . Thoracentesis   12/20/07      Prior to Admission medications   Medication  Sig Start Date End Date Taking? Authorizing Provider  B Complex Vitamins (VITAMIN B COMPLEX) TABS Take 1 tablet by mouth daily.     [provider]  Calcium Carb-Cholecalciferol (CALCIUM 1000 + D) 1000-800 MG-UNIT TABS Take 1 tablet by mouth daily.    [provider]  clotrimazole (LOTRIMIN) 1 % cream Apply 1 application topically 2 (two) times daily. 07/14/17   Bedsole, Amy E, MD  dabigatran (PRADAXA) 150 MG CAPS capsule Take 1 capsule (150 mg total) by mouth every 12 (twelve) hours. 09/27/17   Daune Perch, NP  pindolol (VISKEN) 5 MG tablet TAKE 1 TABLET(5 MG) BY  MOUTH TWICE DAILY 10/26/17   Rise Mu, PA-C  spironolactone (ALDACTONE) 25 MG tablet Take 1 tablet (25 mg total) by mouth daily. 09/14/17   Rise Mu, PA-C    Allergies Celecoxib and Tramadol  Family History  Problem Relation Age of Onset  . Diabetes Unknown   . Breast cancer Paternal Aunt 27  . Heart disease Mother   . Pancreatic cancer Father   . Atrial fibrillation Brother     Social History Social History   Tobacco Use  . Smoking status: Never Smoker  . Smokeless tobacco: Never Used  Substance Use Topics  . Alcohol use: No    Alcohol/week: 0.0 oz  . Drug use: No    Review of Systems Constitutional: No fever/chills Eyes: Chronic right-sided blurry vision.  Reported history of surgery on the right eye. ENT: No sore throat. Cardiovascular: Denies chest pain. Respiratory: Denies shortness of breath. Gastrointestinal: No abdominal pain.  No nausea, no vomiting.  No diarrhea.  No constipation. Genitourinary: Negative for dysuria. Musculoskeletal: Negative for neck pain.  Negative for back pain. Integumentary: Negative for rash. Neurological: Acute onset right sided headache.  No numbness or weakness in any of her extremities   ____________________________________________   PHYSICAL EXAM:  VITAL SIGNS: ED Triage Vitals [01/30/18 2110]  Enc Vitals Group     BP (!) 164/68     Pulse Rate (!) 54     Resp 18     Temp 97.9 F (36.6 C)     Temp Source Oral     SpO2 99 %     Weight 64.9 kg (143 lb)     Height 1.6 m (5\' 3" )     Head Circumference      Peak Flow      Pain Score 5     Pain Loc      Pain Edu?      Excl. in [place]?     Constitutional: Alert and oriented. Well appearing and in no acute distress. Eyes: Conjunctivae are normal. PERRL. EOMI. no discharge or purulence Head: Atraumatic.  No tenderness to palpation of either temporal region. Nose: No congestion/rhinnorhea. Mouth/Throat: Mucous membranes are moist. Neck: No stridor.  No meningeal  signs.   Cardiovascular: Normal rate, regular rhythm. Good peripheral circulation. Grossly normal heart sounds. Respiratory: Normal respiratory effort.  No retractions. Lungs CTAB. Gastrointestinal: Soft and nontender. No distention.  Musculoskeletal: No lower extremity tenderness nor edema. No gross deformities of extremities. Neurologic:  Normal speech and language. No gross focal neurologic deficits are appreciated.  Skin:  Skin is warm, dry and intact. No rash noted. Psychiatric: Mood and affect are normal. Speech and behavior are normal.  ____________________________________________   LABS (all labs ordered are listed, but only abnormal results are displayed)  Labs Reviewed - No data to display ____________________________________________  EKG  None - EKG not  ordered by ED physician ____________________________________________  RADIOLOGY   ED MD interpretation:  No acute/emergent abnormalities  Official radiology report(s): Ct Head Wo Contrast  Result Date: 01/31/2018 CLINICAL DATA:  LEFT headache, vision changes and eye irritation. History of hypertension, hyperlipidemia. EXAM: CT HEAD WITHOUT CONTRAST TECHNIQUE: Contiguous axial images were obtained from the base of the skull through the vertex without intravenous contrast. COMPARISON:  CT HEAD November 26, 2015 FINDINGS: BRAIN: No intraparenchymal hemorrhage, mass effect nor midline shift. The ventricles and sulci are normal for age. Patchy supratentorial white matter hypodensities less than expected for patient's age, though non-specific are most compatible with chronic small vessel ischemic disease. No acute large vascular territory infarcts. No abnormal extra-axial fluid collections. Basal cisterns are patent. VASCULAR: Mild calcific atherosclerosis of the carotid siphons. SKULL: No skull fracture. No significant scalp soft tissue swelling. SINUSES/ORBITS: The mastoid air-cells and included paranasal sinuses are  well-aerated.The included ocular globes and orbital contents are non-suspicious. Status post RIGHT ocular lens implant. OTHER: None. IMPRESSION: Normal noncontrast CT HEAD for age. Electronically Signed   By: Elon Alas M.D.   On: 01/31/2018 03:03    ____________________________________________   PROCEDURES  Critical Care performed: No   Procedure(s) performed:   Procedures   ____________________________________________   INITIAL IMPRESSION / ASSESSMENT AND PLAN / ED COURSE  As part of my medical decision making, I reviewed the following data within the [place] notes reviewed and incorporated    Differential diagnosis includes, but is not limited to, intracranial hemorrhage, meningitis/encephalitis, previous head trauma, cavernous venous thrombosis, tension headache, temporal arteritis, migraine or migraine equivalent, idiopathic intracranial hypertension, and non-specific headache.  she has no tenderness to palpation of her temples and I do not think this represents temporal arteritis.  The vein that she is talking about is on a more anterior part of her right forehead and is nontender and not arterial.  I think that she typically has this vein but she just has not noticed it before and now she is worried about the right side of her head.  She does not appear to be in any distress.  Her pupils Are equal and reactive and she has a normal external eye exam.  There may have been some miscommunication initially in triage, but she is not telling me that she has any sort of foreign body sensation, she is more concerned about the pain in the side of her head and her chronically blurry vision.  There is no evidence of an acute or emergent condition at this time.  We discussed that and given the acute onset headache when she has no history of headaches or migraines, we are going to obtain a noncontrast head CT to make sure there is no evidence of intracranial  bleeding or less likely a subacute CVA, but she has no focal neurological deficits.  There is no role for blood work at this time; her vital signs are all reassuring with no evidence of infection and none of the symptoms she describes sound infectious.  She is comfortable with plan for discharge and outpatient follow-up at the [facility] at the next available opportunity if her head CT is normal.     Clinical Course as of Jan 31 414  Wed Jan 31, 2018  0413 Head CT within normal limits with no evidence of any acute or emergent condition.  The patient seems to still be in no acute distress although she is still reporting some pain.  I  gave her 1 g of Tylenol and encourage close outpatient follow-up with her ophthalmologist.  She understands and agrees with the plan.  No indication that she requires further emergent work-up tonight.   [CF]    Clinical Course User Index [CF] Hinda Kehr, MD    ____________________________________________  FINAL CLINICAL IMPRESSION(S) / ED DIAGNOSES  Final diagnoses:  Right-sided headache  Blurry vision, right eye     MEDICATIONS GIVEN DURING THIS VISIT:  Medications  acetaminophen (TYLENOL) tablet 1,000 mg (1,000 mg Oral Given 01/31/18 0359)     ED Discharge Orders    None       Note:  This document was prepared using Dragon voice recognition software and may include unintentional dictation errors.    Hinda Kehr, MD 01/31/18 [phone]

## 2018-01-31 NOTE — ED Notes (Signed)
ED Provider at bedside. 

## 2018-02-02 ENCOUNTER — Other Ambulatory Visit: Payer: Self-pay

## 2018-02-02 ENCOUNTER — Encounter: Payer: Self-pay | Admitting: Family Medicine

## 2018-02-02 ENCOUNTER — Ambulatory Visit (INDEPENDENT_AMBULATORY_CARE_PROVIDER_SITE_OTHER): Payer: Medicare Other | Admitting: Family Medicine

## 2018-02-02 VITALS — BP 134/60 | HR 51 | Temp 97.7°F | Ht 62.5 in | Wt 147.0 lb

## 2018-02-02 DIAGNOSIS — R609 Edema, unspecified: Secondary | ICD-10-CM | POA: Insufficient documentation

## 2018-02-02 DIAGNOSIS — R519 Headache, unspecified: Secondary | ICD-10-CM | POA: Insufficient documentation

## 2018-02-02 DIAGNOSIS — R51 Headache: Secondary | ICD-10-CM

## 2018-02-02 LAB — T3, FREE: T3 FREE: 2.5 pg/mL (ref 2.3–4.2)

## 2018-02-02 LAB — T4, FREE: Free T4: 1.2 ng/dL (ref 0.8–1.8)

## 2018-02-02 LAB — TSH: TSH: 1.52 mIU/L (ref 0.40–4.50)

## 2018-02-02 NOTE — Assessment & Plan Note (Signed)
Eval with labs. Likely at least in part due to venous insufficiency.  Start elevation and compression hose.  May need trial of diuretic if not resolving.

## 2018-02-02 NOTE — Assessment & Plan Note (Signed)
Improved.  neg Head CT, no neuro changes or symptoms.  likel tension headache or due to chronic eye issue.

## 2018-02-02 NOTE — Progress Notes (Signed)
Subjective:    Patient ID: [name], female    DOB: [birth year], 77 y.o.   MRN: [id number]  HPI   77 year old female with HTN, PAF, RA, nonocclusive CAD presents for ER follow up.   She was seen on 4/24 for right sided headache.  Summary of ER HPI: She reports that she had acute onset of right-sided headache that was severe at its worst but is calm down to being mild at this time.  She is also concerned because there is a prominent vein on the right side of her forehead that she has not noticed before and it is sticking out.  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  She has not had any traumatic injury recently.  She has not had any excessive amount of discharge or purulence coming from the eye.  She denies fever/chills, chest pain, shortness of breath, nausea, vomiting, and abdominal pain.  Light does not make the pain worse.  She always has some blurry vision in the right eye which is baseline.  She saw Dr. George Ina within the last few weeks or months at the [facility] and she was told that he could take her for additional surgery but is unclear if this would be helpful.  Overall she describes the symptoms as moderate and she is in no acute distress at this time.  In ER: Head CT:  nml for age  Given tylenol.  Today she reports headache improved, only slight posterior headache. She has also noted some swelling in feet and  ankles in last several months, worse last few days. She has bilaterally varicose veins.   No chest pain, no change in breathing  BP well controlled at home  No change in salt. She is going to gym once a week.   She has follow up with eye MD next week for re-eval vision changes.    Blood pressure 134/60, pulse (!) 51, temperature 97.7 F (36.5 C), temperature source Oral, height 5' 2.5"  (1.588 m), weight 147 lb (66.7 kg). Social History /Family History/Past Medical History reviewed in detail and updated in EMR if needed.   Review of Systems  Constitutional: Negative for fatigue and fever.  HENT: Negative for congestion.   Eyes: Negative for pain.  Respiratory: Negative for cough and shortness of breath.   Cardiovascular: Negative for chest pain, palpitations and leg swelling.  Gastrointestinal: Negative for abdominal pain.  Genitourinary: Negative for dysuria and vaginal bleeding.  Musculoskeletal: Negative for back pain.  Neurological: Negative for syncope, light-headedness and headaches.  Psychiatric/Behavioral: Negative for dysphoric mood.       Objective:   Physical Exam  Constitutional: Vital signs are normal. She appears well-developed and well-nourished. She is cooperative.  Non-toxic appearance. She does not appear ill. No distress.  HENT:  Head: Normocephalic.  Right Ear: Hearing, tympanic membrane, external ear and ear canal normal. Tympanic membrane is not erythematous, not retracted and not bulging.  Left Ear: Hearing, tympanic membrane, external ear and ear canal normal. Tympanic membrane is not erythematous, not retracted and not bulging.  Nose: No mucosal edema or rhinorrhea. Right sinus exhibits no maxillary sinus tenderness and no frontal sinus tenderness. Left sinus exhibits no maxillary sinus tenderness and no frontal sinus tenderness.  Mouth/Throat: Uvula  is midline, oropharynx is clear and moist and mucous membranes are normal.  Eyes: Pupils are equal, round, and reactive to light. Conjunctivae, EOM and lids are normal. Lids are everted and swept, no foreign bodies found.  Neck: Trachea normal and normal range of motion. Neck supple. Carotid bruit is not present. No thyroid mass and no thyromegaly present.  Cardiovascular: Normal rate, regular rhythm, S1 normal, S2 normal, normal heart sounds, intact distal pulses and normal pulses. Exam reveals no  gallop and no friction rub.  No murmur heard.  Bilateral varicose veins, 1 plus pitting edema in bilatearl feet and ankle.  Pulmonary/Chest: Effort normal and breath sounds normal. No tachypnea. No respiratory distress. She has no decreased breath sounds. She has no wheezes. She has no rhonchi. She has no rales.  Abdominal: Soft. Normal appearance and bowel sounds are normal. There is no tenderness.  Neurological: She is alert.  Skin: Skin is warm, dry and intact. No rash noted.  Psychiatric: Her speech is normal and behavior is normal. Judgment and thought content normal. Her mood appears not anxious. Cognition and memory are normal. She does not exhibit a depressed mood.          Assessment & Plan:

## 2018-02-02 NOTE — Patient Instructions (Addendum)
Start compression hose and  elevation of legs above heart when sitting.  Keep up with regular exercise.  Make sure to follow up with eye doctor as it is likely contributing to head pain.  Please stop at the lab to have labs drawn.

## 2018-02-03 LAB — CBC WITH DIFFERENTIAL/PLATELET
BASOS ABS: 63 {cells}/uL (ref 0–200)
Basophils Relative: 1.1 %
EOS ABS: 148 {cells}/uL (ref 15–500)
EOS PCT: 2.6 %
HCT: 31.5 % — ABNORMAL LOW (ref 35.0–45.0)
Hemoglobin: 10.5 g/dL — ABNORMAL LOW (ref 11.7–15.5)
Lymphs Abs: 2058 cells/uL (ref 850–3900)
MCH: 30.4 pg (ref 27.0–33.0)
MCHC: 33.3 g/dL (ref 32.0–36.0)
MCV: 91.3 fL (ref 80.0–100.0)
MONOS PCT: 8.2 %
MPV: 12.2 fL (ref 7.5–12.5)
NEUTROS PCT: 52 %
Neutro Abs: 2964 cells/uL (ref 1500–7800)
PLATELETS: 195 10*3/uL (ref 140–400)
RBC: 3.45 10*6/uL — ABNORMAL LOW (ref 3.80–5.10)
RDW: 12.4 % (ref 11.0–15.0)
TOTAL LYMPHOCYTE: 36.1 %
WBC mixed population: 467 cells/uL (ref 200–950)
WBC: 5.7 10*3/uL (ref 3.8–10.8)

## 2018-02-03 LAB — COMPREHENSIVE METABOLIC PANEL
AG RATIO: 1.4 (calc) (ref 1.0–2.5)
ALT: 9 U/L (ref 6–29)
AST: 18 U/L (ref 10–35)
Albumin: 4 g/dL (ref 3.6–5.1)
Alkaline phosphatase (APISO): 51 U/L (ref 33–130)
BUN / CREAT RATIO: 16 (calc) (ref 6–22)
BUN: 21 mg/dL (ref 7–25)
CO2: 24 mmol/L (ref 20–32)
CREATININE: 1.34 mg/dL — AB (ref 0.60–0.93)
Calcium: 9.6 mg/dL (ref 8.6–10.4)
Chloride: 109 mmol/L (ref 98–110)
GLUCOSE: 86 mg/dL (ref 65–99)
Globulin: 2.8 g/dL (calc) (ref 1.9–3.7)
Potassium: 5.2 mmol/L (ref 3.5–5.3)
Sodium: 144 mmol/L (ref 135–146)
Total Bilirubin: 0.5 mg/dL (ref 0.2–1.2)
Total Protein: 6.8 g/dL (ref 6.1–8.1)

## 2018-02-06 ENCOUNTER — Encounter: Payer: Self-pay | Admitting: *Deleted

## 2018-02-09 ENCOUNTER — Other Ambulatory Visit: Payer: Self-pay | Admitting: *Deleted

## 2018-02-09 DIAGNOSIS — H26491 Other secondary cataract, right eye: Secondary | ICD-10-CM | POA: Diagnosis not present

## 2018-02-09 MED ORDER — SPIRONOLACTONE 25 MG PO TABS: 25.0000 mg | ORAL_TABLET | Freq: Every day | ORAL | 0 refills | Status: DC

## 2018-03-01 ENCOUNTER — Encounter: Payer: Self-pay | Admitting: Gastroenterology

## 2018-03-16 ENCOUNTER — Other Ambulatory Visit: Payer: Self-pay | Admitting: Physician Assistant

## 2018-03-27 ENCOUNTER — Other Ambulatory Visit: Payer: Self-pay | Admitting: Cardiology

## 2018-03-27 NOTE — Telephone Encounter (Signed)
Pradaxa 150mg  refill request received; pt is 77 yrs old, wt-66.7kg, Crea-1.34 on 02/02/18, last seen by Christell Faith on 09/14/17, CrCl-38.62ml/min; will send in refill to requested pharmacy.

## 2018-04-16 ENCOUNTER — Ambulatory Visit (INDEPENDENT_AMBULATORY_CARE_PROVIDER_SITE_OTHER)
Admission: RE | Admit: 2018-04-16 | Discharge: 2018-04-16 | Disposition: A | Discharge status: Home / Self Care | Payer: Medicare Other | Source: Ambulatory Visit | Attending: Family Medicine | Admitting: Family Medicine

## 2018-04-16 ENCOUNTER — Ambulatory Visit (INDEPENDENT_AMBULATORY_CARE_PROVIDER_SITE_OTHER): Payer: Medicare Other | Admitting: Family Medicine

## 2018-04-16 VITALS — BP 120/70 | HR 46 | Temp 98.0°F | Ht 62.5 in | Wt 146.2 lb

## 2018-04-16 DIAGNOSIS — T148XXA Other injury of unspecified body region, initial encounter: Secondary | ICD-10-CM | POA: Diagnosis not present

## 2018-04-16 DIAGNOSIS — M79671 Pain in right foot: Secondary | ICD-10-CM

## 2018-04-16 DIAGNOSIS — M19071 Primary osteoarthritis, right ankle and foot: Secondary | ICD-10-CM | POA: Diagnosis not present

## 2018-04-16 IMAGING — DX DG FOOT COMPLETE 3+V*R*
3 series · 3 of 3 positions shown · non-contrast
Comparison: None.

CLINICAL DATA: Trauma 2 months ago.  Pain.

EXAM:
RIGHT FOOT COMPLETE - 3+ VIEW

[foot ap]
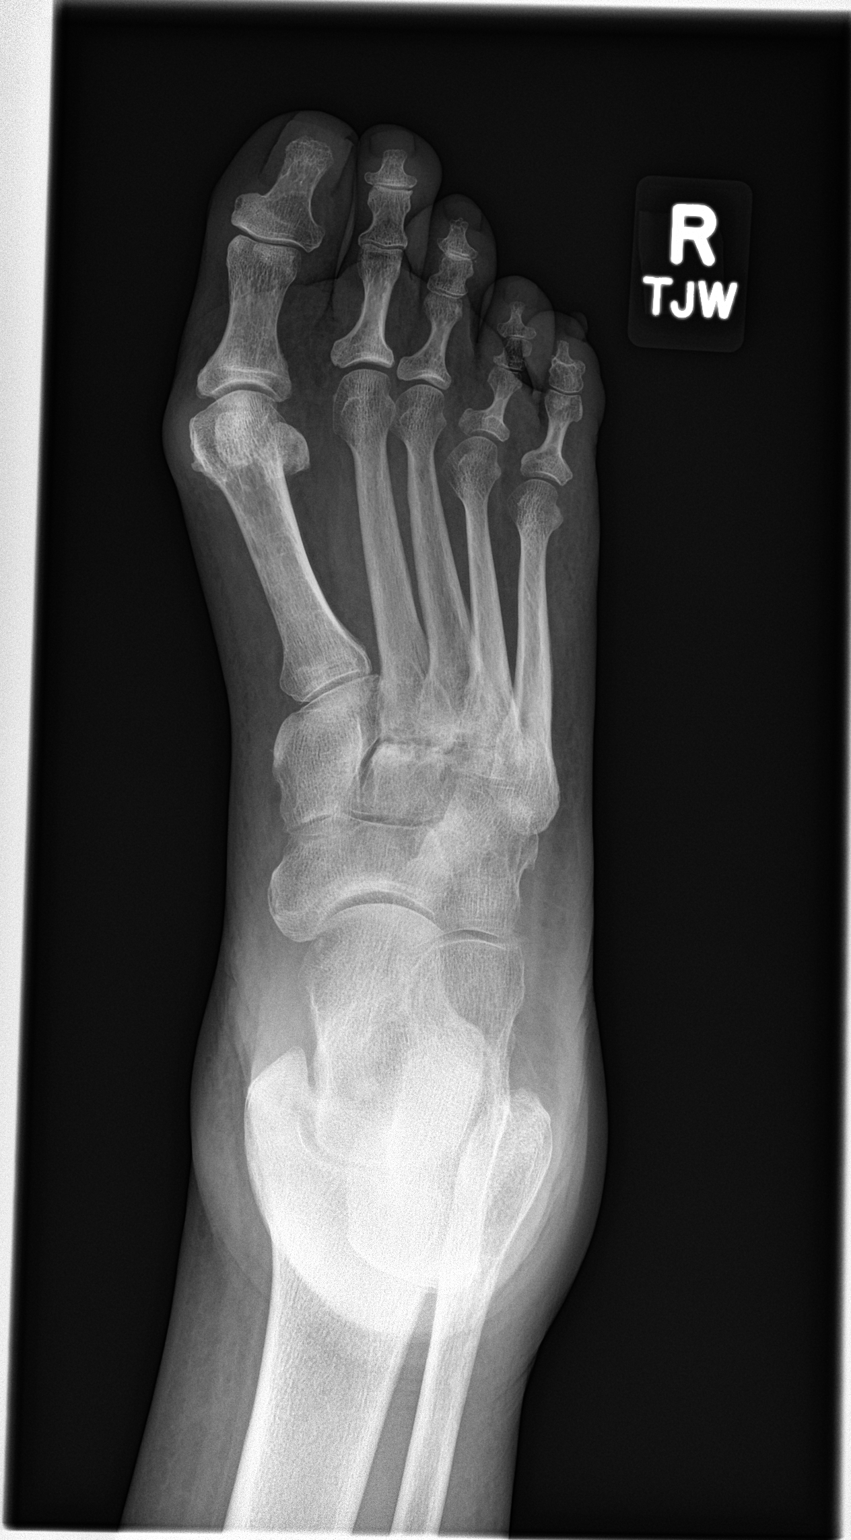

[foot obl]
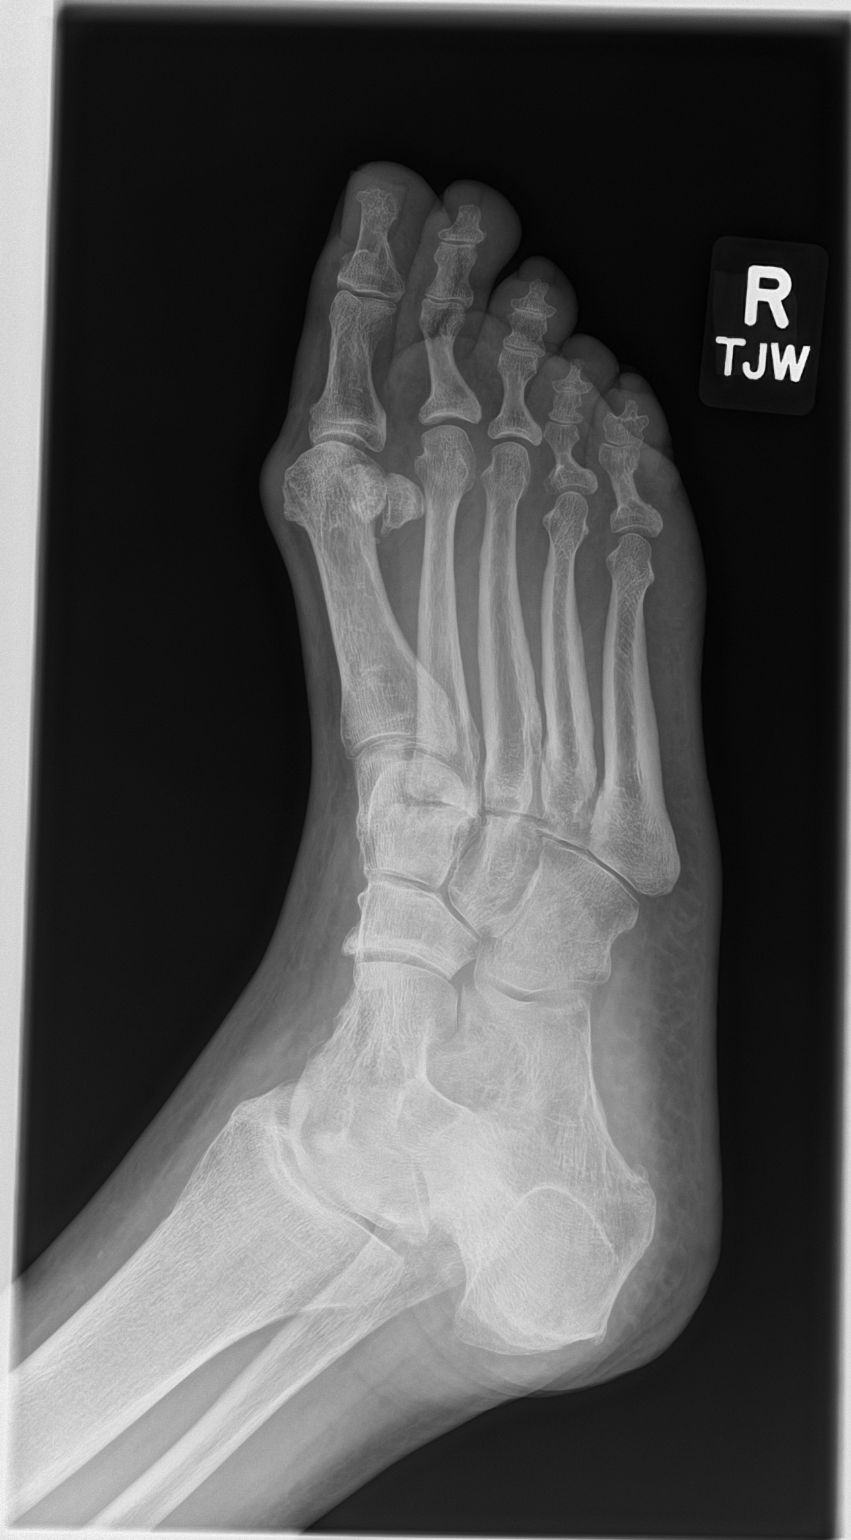

[foot lat]
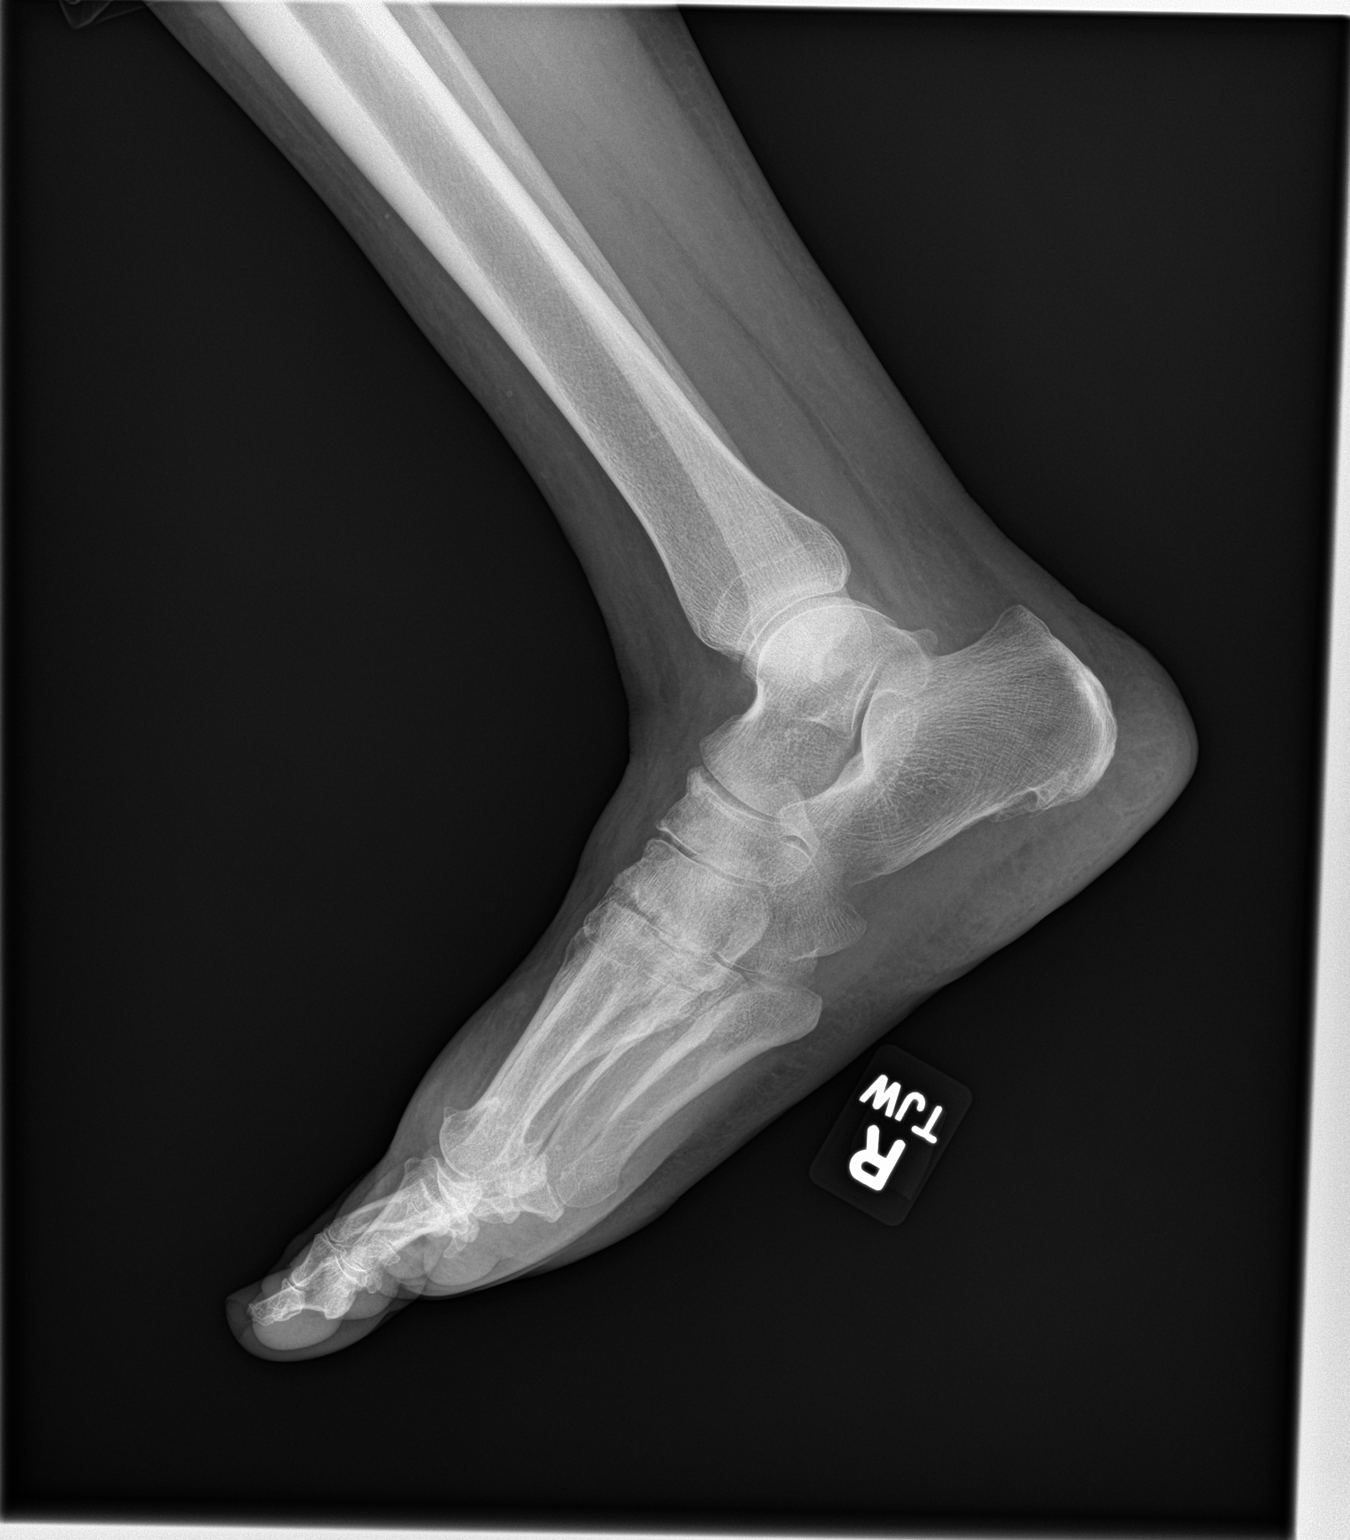

[3 of 3 positions shown; findings below may reference images not displayed]

FINDINGS: There is no evidence of fracture or dislocation. There is
degenerative change at the tarsometatarsal joints second through
fourth. Dorsal soft tissue swelling. No metatarsal neck fracture.
Digits appear unremarkable. Plantar spurs. Hallux valgus.
IMPRESSION: Degenerative change.  No visible fracture.

## 2018-04-16 NOTE — Progress Notes (Signed)
Dr. Frederico Hamman T. Ymani Porcher, MD, [facility] [place], [address] Phone: [phone] Fax: [phone]  04/16/2018  Patient: [name], MRN: [id number], DOB: July 20, 1941, 77 y.o.  Primary Physician:  Jacqueline Sanders, MD   Chief Complaint  Patient presents with  . Foot Pain    Right   Subjective:   [name] is a 77 y.o. very pleasant female patient who presents with the following:  R foot pain: She is a friendly lady who I remember from prior visits, and she presents with right-sided foot pain and swelling on the dorsum of her foot around the second and third metatarsals.  The only injury that she can recall is while taking care of her grandson, he dropped something on her foot a couple of months ago, and it is been persistently painful.  She also does carry a diagnosis of osteopenia and is 77 years old - with normalcy for age on 2016 DEXA.  She also carries a diagnosis in the chart of rheumatoid arthritis.  Grandson "broke a bone" - about 2 months ago or longer. Now having some swelling.   Past Medical History, Surgical History, Social History, Family History, Problem List, Medications, and Allergies have been reviewed and updated if relevant.  Patient Active Problem List   Diagnosis Date Noted  . Peripheral edema 02/02/2018  . Acute nonintractable headache 02/02/2018  . Coronary artery disease, non-occlusive 09/14/2017  . PAF (paroxysmal atrial fibrillation) ([place]) 09/14/2017  . Status post ablation of atrial fibrillation 09/14/2017  . Essential hypertension 09/14/2017  . Tinea pedis 07/14/2017  . 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/15/2016  . CKD (chronic kidney disease) stage 3, GFR 30-59 ml/min (HCC) 03/14/2016  . Rheumatoid arthritis ([place]) 03/14/2016  . Mitral regurgitation 12/30/2015  . Obesity 12/02/2015  . Aortic valve insufficiency 12/02/2015  . Vitamin D deficiency 04/17/2015  . Counseling  regarding end of life decision making 04/17/2015  . Allergic rhinitis 02/03/2015  . Aortic valve stenosis, mild 08/19/2010  . Osteopenia 05/11/2010  . SINUS BRADYCARDIA 08/19/2009  . Anemia, iron deficiency 12/13/2007  . HYPERLIPIDEMIA 08/23/2007  . UNSPECIFIED GLAUCOMA 10/10/2006    Past Medical History:  Diagnosis Date  . Anemia   . Aortic insufficiency    a. echo 11/11: EF 55-60%, mod LVH, mild AS (mean 12), mild AI, mild MR, mild LAE  . Coronary artery disease, non-occlusive    a. cath 2/09: no CAD, EF 70%  . HYPERLIPIDEMIA   . HYPERTENSION   . OSTEOPENIA   . PAF (paroxysmal atrial fibrillation) ([place])    a. s/p ablation 2013; b. on pradaxa; c. CHADS2VASc => 4 (HTN, age x 2, female)  . Pneumonia 2009  . RA (rheumatoid arthritis) ([place])   . SINUS BRADYCARDIA    a. asymptomatic   . Unspecified glaucoma(365.9)     Past Surgical History:  Procedure Laterality Date  . ABLATION OF DYSRHYTHMIC FOCUS    . CARDIAC CATHETERIZATION    . COLONOSCOPY  04/18/08  . Partial hysterectomy--1979    . Thoracentesis   12/20/07      Social History   Socioeconomic History  . Marital status: Widowed    Spouse name: Not on file  . Number of children: Not on file  . Years of education: Not on file  . Highest education level: Not on file  Occupational History  . Not on file  Social Needs  . Financial resource strain: Not on file  .  Food insecurity:    Worry: Not on file    Inability: Not on file  . Transportation needs:    Medical: Not on file    Non-medical: Not on file  Tobacco Use  . Smoking status: Never Smoker  . Smokeless tobacco: Never Used  Substance and Sexual Activity  . Alcohol use: No    Alcohol/week: 0.0 oz  . Drug use: No  . Sexual activity: Never  Lifestyle  . Physical activity:    Days per week: Not on file    Minutes per session: Not on file  . Stress: Not on file  Relationships  . Social connections:    Talks on phone: Not on file    Gets together: Not  on file    Attends religious service: Not on file    Active member of club or organization: Not on file    Attends meetings of clubs or organizations: Not on file    Relationship status: Not on file  . Intimate partner violence:    Fear of current or ex partner: Not on file    Emotionally abused: Not on file    Physically abused: Not on file    Forced sexual activity: Not on file  Other Topics Concern  . 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 Full Code ( reviewed 2015)       Occasional exercise.   Diet: fruits and veggies, lean meats.    Family History  Problem Relation Age of Onset  . Diabetes Unknown   . Breast cancer Paternal Aunt 44  . Heart disease Mother   . Pancreatic cancer Father   . Atrial fibrillation Brother     Allergies  Allergen Reactions  . Celecoxib Other (See Comments)    Tachycardia/palpitations  . Tramadol Nausea Only    Medication list reviewed and updated in full in [facility].  GEN: No fevers, chills. Nontoxic. Primarily MSK c/o today. MSK: Detailed in the HPI GI: tolerating PO intake without difficulty Neuro: No numbness, parasthesias, or tingling associated. Otherwise the pertinent positives of the ROS are noted above.   Objective:   BP 120/70   Pulse (!) 46   Temp 98 F (36.7 C) (Oral)   Ht 5' 2.5" (1.588 m)   Wt 146 lb 4 oz (66.3 kg)   BMI 26.32 kg/m    GEN: WDWN, NAD, Non-toxic, Alert & Oriented x 3 HEENT: Atraumatic, Normocephalic.  Ears and Nose: No external deformity. EXTR: No clubbing/cyanosis/edema NEURO: Normal  gait.  PSYCH: Normally interactive. Conversant. Not depressed or anxious appearing.  Calm demeanor.    Patient does have some swelling in the forefoot on the dorsum around primarily to and to a lesser extent distal third metatarsals.  Her great toe joint, first MTP moves very little.  She also has pain with compression at the midfoot throughout most of the midfoot bones.  Range of motion at the ankle is normal and nontender of all bony anatomy and ligamentous anatomy is nontender.  She does have pain with compression along the shaft of the second metatarsal and to a lesser extent the third.  Radiology: Dg Foot Complete Right  Result Date: 04/16/2018 CLINICAL DATA:  Trauma 2 months ago.  Pain. EXAM: RIGHT FOOT COMPLETE - 3+ VIEW COMPARISON:  None. FINDINGS: There is no evidence of fracture or dislocation. There is degenerative change at the tarsometatarsal joints second through fourth. Dorsal soft tissue swelling. No metatarsal neck fracture. Digits appear unremarkable. Plantar spurs. Hallux valgus. IMPRESSION: Degenerative change.  No visible fracture. Electronically Signed   By: Staci Righter M.D.   On: 04/16/2018 16:09     Assessment and Plan:   Right foot pain - Plan: DG Foot Complete Right  Contusion of bone  >25 minutes spent in face to face time with patient, >50% spent in counselling or coordination of care   Given extent of discomfort, I am in a place the patient in a short cam walker boot.  My suspicion is that the patient has a stress reaction/fracture equivalent at age 77.  Trauma may or may not have been relevant, but overuse or walking could have done this by itself.  Patient placed in a pneumatic compression CAM, which has been shown to decrease overall time of rehab, swelling, and faster return to full function.   If symptoms persist, [name] of the foot would be reasonable next diagnostic option.  Follow-up: Return in about 1 month (around 05/14/2018).  Orders Placed This  Encounter  Procedures  . DG Foot Complete Right    Signed,  Kirsten Mckone T. Tonique Mendonca, MD   Allergies as of 04/16/2018      Reactions   Celecoxib Other (See Comments)   Tachycardia/palpitations   Tramadol Nausea Only      Medication List        Accurate as of 04/16/18 11:59 PM. Always use your most recent med list.          CALCIUM 1000 + D 1000-800 MG-UNIT Tabs Generic drug:  Calcium Carb-Cholecalciferol Take 1 tablet by mouth daily.   clotrimazole 1 % cream Commonly known as:  LOTRIMIN Apply 1 application topically 2 (two) times daily.   pindolol 5 MG tablet Commonly known as:  VISKEN TAKE ONE TABLET BY MOUTH TWICE DAILY   PRADAXA 150 MG Caps capsule Generic drug:  dabigatran TAKE 1 CAPSULE BY MOUTH EVERY 12 HOURS   spironolactone 25 MG tablet Commonly known as:  ALDACTONE Take 1 tablet (25 mg total) by mouth daily.   Vitamin B Complex Tabs Take 1 tablet by mouth daily.

## 2018-04-17 ENCOUNTER — Encounter: Payer: Self-pay | Admitting: Family Medicine

## 2018-04-21 ENCOUNTER — Other Ambulatory Visit: Payer: Self-pay | Admitting: Physician Assistant

## 2018-05-21 ENCOUNTER — Other Ambulatory Visit: Payer: Self-pay | Admitting: Physician Assistant

## 2018-06-21 ENCOUNTER — Other Ambulatory Visit: Payer: Self-pay | Admitting: Physician Assistant

## 2018-07-16 ENCOUNTER — Ambulatory Visit (INDEPENDENT_AMBULATORY_CARE_PROVIDER_SITE_OTHER): Payer: Medicare Other

## 2018-07-16 ENCOUNTER — Other Ambulatory Visit: Payer: Self-pay

## 2018-07-16 VITALS — BP 136/64 | HR 52 | Temp 98.6°F | Ht 62.75 in | Wt 143.5 lb

## 2018-07-16 DIAGNOSIS — E782 Mixed hyperlipidemia: Secondary | ICD-10-CM | POA: Diagnosis not present

## 2018-07-16 DIAGNOSIS — E559 Vitamin D deficiency, unspecified: Secondary | ICD-10-CM | POA: Diagnosis not present

## 2018-07-16 DIAGNOSIS — Z0000 Encounter for general adult medical examination without abnormal findings: Secondary | ICD-10-CM | POA: Diagnosis not present

## 2018-07-16 DIAGNOSIS — Z23 Encounter for immunization: Secondary | ICD-10-CM | POA: Diagnosis not present

## 2018-07-16 LAB — LIPID PANEL
CHOLESTEROL: 191 mg/dL (ref 0–200)
HDL: 68 mg/dL (ref >39.00)
LDL CALC: 111 mg/dL — AB (ref 0–99)
NONHDL: 122.62
Total CHOL/HDL Ratio: 3
Triglycerides: 57 mg/dL (ref 0.0–149.0)
VLDL: 11.4 mg/dL (ref 0.0–40.0)

## 2018-07-16 LAB — VITAMIN D 25 HYDROXY (VIT D DEFICIENCY, FRACTURES): VITD: 32.56 ng/mL (ref 30.00–100.00)

## 2018-07-16 NOTE — Progress Notes (Signed)
Subjective:   [name] is a 77 y.o. female who presents for Medicare Annual (Subsequent) preventive examination.  Review of Systems:  N/A Cardiac Risk Factors include: advanced age (>23men, >17 women);dyslipidemia;hypertension     Objective:     Vitals: BP 136/64 (BP Location: Right Arm, Patient Position: Sitting, Cuff Size: Normal)   Pulse (!) 52   Temp 98.6 F (37 C) (Oral)   Ht 5' 2.75" (1.594 m) Comment: no shoes  Wt 143 lb 8 oz (65.1 kg)   SpO2 97%   BMI 25.62 kg/m   Body mass index is 25.62 kg/m.  Advanced Directives 07/16/2018 07/12/2017 03/10/2016 11/26/2015 05/11/2015  Does Patient Have a Medical Advance Directive? Yes Yes Yes No Yes  Type of Paramedic of [place];Living will [place];Living will [place];Living will - [place];Living will  Does patient want to make changes to medical advance directive? - - No - Patient declined - -  Copy of [place] in Chart? No - copy requested No - copy requested No - copy requested - No - copy requested  Would patient like information on creating a medical advance directive? - - - No - patient declined information -    Tobacco Social History   Tobacco Use  Smoking Status Never Smoker  Smokeless Tobacco Never Used     Counseling given: No   Clinical Intake:  Pre-visit preparation completed: Yes  Pain : No/denies pain Pain Score: 0-No pain     Nutritional Status: BMI 25 -29 Overweight Nutritional Risks: None Diabetes: No  How often do you need to have someone help you when you read instructions, pamphlets, or other written materials from your doctor or pharmacy?: 1 - Never What is the last grade level you completed in school?: 12th grade  Interpreter Needed?: No  Comments: pt lives with spouse Information entered by :: LPinson, LPN  Past Medical History:  Diagnosis Date  . Anemia   . Aortic  insufficiency    a. echo 11/11: EF 55-60%, mod LVH, mild AS (mean 12), mild AI, mild MR, mild LAE  . Coronary artery disease, non-occlusive    a. cath 2/09: no CAD, EF 70%  . HYPERLIPIDEMIA   . HYPERTENSION   . OSTEOPENIA   . PAF (paroxysmal atrial fibrillation) ([place])    a. s/p ablation 2013; b. on pradaxa; c. CHADS2VASc => 4 (HTN, age x 2, female)  . Pneumonia 2009  . RA (rheumatoid arthritis) ([place])   . SINUS BRADYCARDIA    a. asymptomatic   . Unspecified glaucoma(365.9)    Past Surgical History:  Procedure Laterality Date  . ABLATION OF DYSRHYTHMIC FOCUS    . CARDIAC CATHETERIZATION    . COLONOSCOPY  04/18/08  . Partial hysterectomy--1979    . Thoracentesis   12/20/07     Family History  Problem Relation Age of Onset  . Diabetes Unknown   . Breast cancer Paternal Aunt 45  . Heart disease Mother   . Pancreatic cancer Father   . Atrial fibrillation Brother    Social History   Socioeconomic History  . Marital status: Widowed    Spouse name: Not on file  . Number of children: Not on file  . Years of education: Not on file  . Highest education level: Not on file  Occupational History  . Not on file  Social Needs  . Financial resource strain: Not on file  . Food insecurity:  Worry: Not on file    Inability: Not on file  . Transportation needs:    Medical: Not on file    Non-medical: Not on file  Tobacco Use  . Smoking status: Never Smoker  . Smokeless tobacco: Never Used  Substance and Sexual Activity  . Alcohol use: No    Alcohol/week: 0.0 standard drinks  . Drug use: No  . Sexual activity: Never  Lifestyle  . Physical activity:    Days per week: Not on file    Minutes per session: Not on file  . Stress: Not on file  Relationships  . Social connections:    Talks on phone: Not on file    Gets together: Not on file    Attends religious service: Not on file    Active member of club or organization: Not on file    Attends meetings of clubs or  organizations: Not on file    Relationship status: Not on file  Other Topics Concern  . 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 Full Code ( reviewed 2015)       Occasional exercise.   Diet: fruits and veggies, lean meats.    Outpatient Encounter Medications as of 07/16/2018  Medication Sig  . B Complex Vitamins (VITAMIN B COMPLEX) TABS Take 1 tablet by mouth daily.   . Calcium Carb-Cholecalciferol (CALCIUM 1000 + D) 1000-800 MG-UNIT TABS Take 1 tablet by mouth daily.  . clotrimazole (LOTRIMIN) 1 % cream Apply 1 application topically 2 (two) times daily.  . pindolol (VISKEN) 5 MG tablet TAKE 1 TABLET BY MOUTH TWICE DAILY  . PRADAXA 150 MG CAPS capsule TAKE 1 CAPSULE BY MOUTH EVERY 12 HOURS  . spironolactone (ALDACTONE) 25 MG tablet Take 1 tablet (25 mg total) by mouth daily.   No facility-administered encounter medications on file as of 07/16/2018.     Activities of Daily Living In your present state of health, do you have any difficulty performing the following activities: 07/16/2018  Hearing? N  Vision? N  Difficulty concentrating or making decisions? N  Walking or climbing stairs? N  Dressing or bathing? N  Doing errands, shopping? N  Preparing Food and eating ? N  Using the Toilet? N  In the past six months, have you accidently leaked urine? N  Do you have problems with loss of bowel control? N  Managing your Medications? N  Managing your Finances? N  Housekeeping or managing your Housekeeping? N  Some recent data might be hidden    Patient Care Team: [name],  MD as PCP - General Birder Robson, MD as Referring Physician (Ophthalmology)    Assessment:   This is a routine wellness examination for [name].   Hearing Screening   125Hz  250Hz  500Hz  1000Hz  2000Hz  3000Hz  4000Hz  6000Hz  8000Hz   Right ear:   40 40 40  40    Left ear:   40 40 40  40    Vision Screening Comments: Vision exam in Jan 2019 with Dr. George Ina    Exercise Activities and Dietary recommendations Current Exercise Habits: Home exercise routine, Type of exercise: strength training/weights;treadmill, Time (Minutes):  30, Frequency (Times/Week): 3, Weekly Exercise (Minutes/Week): 90, Intensity: Moderate, Exercise limited by: None identified  Goals    . Weight < 148 lb (67.132 kg)     Target weight is 140lbs. Starting 07/16/2018, I will continue to exercise for at least 30 min 3 days per week.        Fall Risk Fall Risk  07/16/2018 07/12/2017 03/10/2016 05/11/2015 04/17/2015  Falls in the past year? No No No No No   Depression Screen PHQ 2/9 Scores 07/16/2018 07/12/2017 03/10/2016 05/11/2015  PHQ - 2 Score 0 0 0 0  PHQ- 9 Score 0 1 - -     Cognitive Function MMSE - Mini Mental State Exam 07/16/2018 07/12/2017 03/10/2016  Orientation to time 5 5 5   Orientation to Place 5 5 5   Registration 3 3 3   Attention/ Calculation 0 0 0  Recall 3 1 3   Recall-comments - unable to recall 2 of 3 words -  Language- name 2 objects 0 0 0  Language- repeat 1 1 1   Language- follow 3 step command 3 3 0  Language- follow 3 step command-comments - - pt was unable to follow 3 steps to 3 step command  Language- read & follow direction 0 0 0  Write a sentence 0 0 0  Copy design 0 0 0  Total score 20 18 17      PLEASE NOTE: A Mini-Cog screen was completed. Maximum score is 20. A value of 0 denotes this part of [place] MMSE was not completed or the patient failed this part of the Mini-Cog screening.   Mini-Cog Screening Orientation to Time - Max 5 pts Orientation to Place - Max 5 pts Registration - Max 3  pts Recall - Max 3 pts Language Repeat - Max 1 pts Language Follow 3 Step Command - Max 3 pts     Immunization History  Administered Date(s) Administered  . Influenza Whole 08/05/2009  . Influenza, High Dose Seasonal PF 07/14/2015  . Influenza,inj,Quad PF,6+ Mos 08/25/2016, 07/14/2017, 07/16/2018  . PPD Test 05/01/2012, 04/17/2013  . Pneumococcal Conjugate-13 04/17/2015  . Pneumococcal Polysaccharide-23 10/11/2007  . Td 10/11/2003    Screening Tests Health Maintenance  Topic Date Due  . TETANUS/TDAP  10/10/2023 (Originally 10/10/2013)  . INFLUENZA VACCINE  Completed  . DEXA SCAN  Completed  . PNA vac Low Risk Adult  Completed      Plan:     I have personally reviewed, addressed, and noted the following in the patient's chart:  A. Medical and social history B. Use of alcohol, tobacco or illicit drugs  C. Current medications and supplements D. Functional ability and status E.  Nutritional status F.  Physical activity G. Advance directives H. List of other physicians I.  Hospitalizations, surgeries, and ER visits in previous 12 months J.  [place] to include hearing, vision, cognitive, depression L. Referrals and appointments - none  In addition, I have reviewed and discussed with patient certain preventive protocols, quality metrics, and best practice recommendations. A written personalized care plan for preventive services as well as general preventive health recommendations were provided to patient.  See attached scanned questionnaire for additional information.   Signed,   Lindell Noe, MHA, BS, LPN Health Coach   Lindell Noe, [place]  73/01/1935

## 2018-07-16 NOTE — Progress Notes (Signed)
I reviewed health advisor's note, was available for consultation, and agree with documentation and plan.   Signed,  Delorean Knutzen T. Maddeline Roorda, MD  

## 2018-07-16 NOTE — Progress Notes (Signed)
PCP notes:   Health maintenance:  Flu vaccine - administered  Abnormal screenings:   None  Patient concerns:   Refill for Lotrimin cream requested to be sent to [facility] pharmacy  Nurse concerns:  None  Next PCP appt:   07/26/18 @ 1145

## 2018-07-16 NOTE — Telephone Encounter (Signed)
Patient in office today for AWV. Requested refill of Clotrimazole 1% cream to be sent to [facility] pharmacy.

## 2018-07-16 NOTE — Patient Instructions (Signed)
[name] , Thank you for taking time to come for your Medicare Wellness Visit. I appreciate your ongoing commitment to your health goals. Please review the following plan we discussed and let me know if I can assist you in the future.   These are the goals we discussed: Goals    . Weight < 148 lb (67.132 kg)     Target weight is 140lbs. Starting 07/16/2018, I will continue to exercise for at least 30 min 3 days per week.        This is a list of the screening recommended for you and due dates:  Health Maintenance  Topic Date Due  . Tetanus Vaccine  10/10/2023*  . Flu Shot  Completed  . DEXA scan (bone density measurement)  Completed  . Pneumonia vaccines  Completed  *Topic was postponed. The date shown is not the original due date.   Preventive Care for Adults  A healthy lifestyle and preventive care can promote health and wellness. Preventive health guidelines for adults include the following key practices.  . A routine yearly physical is a good way to check with your health care provider about your health and preventive screening. It is a chance to share any concerns and updates on your health and to receive a thorough exam.  . Visit your dentist for a routine exam and preventive care every 6 months. Brush your teeth twice a day and floss once a day. Good oral hygiene prevents tooth decay and gum disease.  . The frequency of eye exams is based on your age, health, family medical history, use  of contact lenses, and other factors. Follow your health care provider's recommendations for frequency of eye exams.  . Eat a healthy diet. Foods like vegetables, fruits, whole grains, low-fat dairy products, and lean protein foods contain the nutrients you need without too many calories. Decrease your intake of foods high in solid fats, added sugars, and salt. Eat the right amount of calories for you. Get information about a proper diet from your health care provider, if necessary.  .  Regular physical exercise is one of the most important things you can do for your health. Most adults should get at least 150 minutes of moderate-intensity exercise (any activity that increases your heart rate and causes you to sweat) each week. In addition, most adults need muscle-strengthening exercises on 2 or more days a week.  Silver Sneakers may be a benefit available to you. To determine eligibility, you may visit the website: www.silversneakers.com or contact program at 570 269 6582 Mon-Fri between 8AM-8PM.   . Maintain a healthy weight. The body mass index (BMI) is a screening tool to identify possible weight problems. It provides an estimate of body fat based on height and weight. Your health care provider can find your BMI and can help you achieve or maintain a healthy weight.   For adults 20 years and older: ? A BMI below 18.5 is considered underweight. ? A BMI of 18.5 to 24.9 is normal. ? A BMI of 25 to 29.9 is considered overweight. ? A BMI of 30 and above is considered obese.   . Maintain normal blood lipids and cholesterol levels by exercising and minimizing your intake of saturated fat. Eat a balanced diet with plenty of fruit and vegetables. Blood tests for lipids and cholesterol should begin at age 53 and be repeated every 5 years. If your lipid or cholesterol levels are high, you are over 50, or you are at high risk  for heart disease, you may need your cholesterol levels checked more frequently. Ongoing high lipid and cholesterol levels should be treated with medicines if diet and exercise are not working.  . If you smoke, find out from your health care provider how to quit. If you do not use tobacco, please do not start.  . If you choose to drink alcohol, please do not consume more than 2 drinks per day. One drink is considered to be 12 ounces (355 mL) of beer, 5 ounces (148 mL) of wine, or 1.5 ounces (44 mL) of liquor.  . If you are 34-42 years old, ask your health care  provider if you should take aspirin to prevent strokes.  . Use sunscreen. Apply sunscreen liberally and repeatedly throughout the day. You should seek shade when your shadow is shorter than you. Protect yourself by wearing long sleeves, pants, a wide-brimmed hat, and sunglasses year round, whenever you are outdoors.  . Once a month, do a whole body skin exam, using a mirror to look at the skin on your back. Tell your health care provider of new moles, moles that have irregular borders, moles that are larger than a pencil eraser, or moles that have changed in shape or color.

## 2018-07-17 MED ORDER — CLOTRIMAZOLE 1 % EX CREA: 1.0000 "application " | TOPICAL_CREAM | Freq: Two times a day (BID) | CUTANEOUS | 0 refills | Status: DC

## 2018-07-19 ENCOUNTER — Ambulatory Visit: Payer: Medicare Other | Admitting: Internal Medicine

## 2018-07-19 ENCOUNTER — Encounter: Payer: Self-pay | Admitting: Internal Medicine

## 2018-07-19 VITALS — BP 154/72 | HR 48 | Ht 62.5 in | Wt 146.5 lb

## 2018-07-19 DIAGNOSIS — I2510 Atherosclerotic heart disease of native coronary artery without angina pectoris: Secondary | ICD-10-CM | POA: Diagnosis not present

## 2018-07-19 DIAGNOSIS — I10 Essential (primary) hypertension: Secondary | ICD-10-CM | POA: Diagnosis not present

## 2018-07-19 DIAGNOSIS — I38 Endocarditis, valve unspecified: Secondary | ICD-10-CM | POA: Diagnosis not present

## 2018-07-19 DIAGNOSIS — I4819 Other persistent atrial fibrillation: Secondary | ICD-10-CM | POA: Diagnosis not present

## 2018-07-19 NOTE — Patient Instructions (Signed)
Medication Instructions:  Your physician recommends that you continue on your current medications as directed. Please refer to the Current Medication list given to you today.  If you need a refill on your cardiac medications before your next appointment, please call your pharmacy.   Lab work: Have you Primary Care Provider draw a CBC and CMET when you are there.  If you have labs (blood work) drawn today and your tests are completely normal, you will receive your results only by: Marland Kitchen MyChart Message (if you have MyChart) OR . A paper copy in the mail If you have any lab test that is abnormal or we need to change your treatment, we will call you to review the results.  Testing/Procedures: Your physician has requested that you have an echocardiogram IN 1 YEAR PRIOR TO appointment . Echocardiography is a painless test that uses sound waves to create images of your heart. It provides your doctor with information about the size and shape of your heart and how well your heart's chambers and valves are working. This procedure takes approximately one hour. There are no restrictions for this procedure. You may get an IV, if needed, to receive an ultrasound enhancing agent through to better visualize your heart.    Follow-Up: At [facility], you and your health needs are our priority.  As part of our continuing mission to provide you with exceptional heart care, we have created designated Provider Care Teams.  These Care Teams include your primary Cardiologist (physician) and Advanced Practice Providers (APPs -  Physician Assistants and Nurse Practitioners) who all work together to provide you with the care you need, when you need it. You will need a follow up appointment in 12 MONTH.  Please call our office 2 months in advance to schedule this appointment.  You may see  or one of the following Advanced Practice Providers on your designated Care Team:   Murray Hodgkins, NP Christell Faith, PA-C . Marrianne Mood, PA-C

## 2018-07-19 NOTE — Progress Notes (Signed)
Follow-up Outpatient Visit Date: 07/19/2018  Primary Care Provider: [name], MD [place] [address]  Chief Complaint: Follow-up atrial fibrillation, valvular heart disease, and nonobstructive CAD  HPI:  [name] is a 77 y.o. year-old female with history of nonobstructive coronary artery disease, paroxysmal atrial fibrillation status post ablation, asymptomatic bradycardia, mitral regurgitation, aortic insufficiency, chronic kidney disease stage II, hypertension, and hyperlipidemia, who presents for follow-up of atrial fibrillation.  She was previously followed in our office by Dr. Yvone [name] and was last seen by Jacqueline Faith, PA, in 09/2017.  She was doing well at that time without complaints.  She remained mildly bradycardic without symptoms.  No medication changes were made at that time.  Subsequent echo showed normal LVEF with mild LVH, mild aortic regurgitation, and moderate mitral regurgitation.  Overall, findings were similar to previous echocardiogram.  Today, [name] reports feeling well.  She denies chest pain, shortness of breath, palpitations, and lightheadedness.  She had some swelling in the right foot after an injury earlier this year.  Otherwise, she has not had any edema.  She also denies orthopnea and PND.  She has not had any significant bleeding, remaining on dabigatran.  She notes that she has been eating more salt recently and wonders if that is why her blood pressure is elevated today.  It is typically well controlled at home.  --------------------------------------------------------------------------------------------------  Past Medical History:  Diagnosis Date  . Anemia   . Aortic insufficiency    a. echo 11/11: EF 55-60%, mod LVH, mild AS (mean 12), mild AI, mild MR, mild LAE  . Coronary artery disease, non-occlusive    a. cath 2/09: no CAD, EF 70%  . HYPERLIPIDEMIA   . HYPERTENSION   . OSTEOPENIA   . PAF (paroxysmal atrial  fibrillation) ([place])    a. s/p ablation 2013; b. on pradaxa; c. CHADS2VASc => 4 (HTN, age x 2, female)  . Pneumonia 2009  . RA (rheumatoid arthritis) ([place])   . SINUS BRADYCARDIA    a. asymptomatic   . Unspecified glaucoma(365.9)    Past Surgical History:  Procedure Laterality Date  . ABLATION OF DYSRHYTHMIC FOCUS    . CARDIAC CATHETERIZATION    . COLONOSCOPY  04/18/08  . Partial hysterectomy--1979    . Thoracentesis   12/20/07      Current Meds  Medication Sig  . B Complex Vitamins (VITAMIN B COMPLEX) TABS Take 1 tablet by mouth daily.   . Calcium Carb-Cholecalciferol (CALCIUM 1000 + D) 1000-800 MG-UNIT TABS Take 1 tablet by mouth daily.  . clotrimazole (LOTRIMIN) 1 % cream Apply 1 application topically 2 (two) times daily.  . pindolol (VISKEN) 5 MG tablet TAKE 1 TABLET BY MOUTH TWICE DAILY  . PRADAXA 150 MG CAPS capsule TAKE 1 CAPSULE BY MOUTH EVERY 12 HOURS  . spironolactone (ALDACTONE) 25 MG tablet Take 1 tablet (25 mg total) by mouth daily.    Allergies: Celecoxib and Tramadol  Social History   Tobacco Use  . Smoking status: Never Smoker  . Smokeless tobacco: Never Used  Substance Use Topics  . Alcohol use: No    Alcohol/week: 0.0 standard drinks  . Drug use: No    Family History  Problem Relation Age of Onset  . Diabetes Unknown   . Breast cancer Paternal Aunt 64  . Heart disease Mother   . Pancreatic cancer Father   . Atrial fibrillation Brother     Review of Systems: A 12-system review of systems  was performed and was negative except as noted in the HPI.  --------------------------------------------------------------------------------------------------  Physical Exam: BP (!) 154/72 (BP Location: Left Arm, Patient Position: Sitting, Cuff Size: Normal)   Pulse (!) 48   Ht 5' 2.5" (1.588 m)   Wt 146 lb 8 oz (66.5 kg)   BMI 26.37 kg/m   General: NAD. HEENT: No conjunctival pallor or scleral icterus. Moist mucous membranes.  OP clear. Neck: Supple  without lymphadenopathy, thyromegaly, JVD, or HJR. No carotid bruit. Lungs: Normal work of breathing. Clear to auscultation bilaterally without wheezes or crackles. Heart: Bradycardic but regular with 2/6 holosystolic murmur loudest at the left lower sternal border.  No rubs or gallops.  Non-displaced PMI. Abd: Bowel sounds present. Soft, NT/ND without hepatosplenomegaly Ext: No lower extremity edema. Radial, PT, and DP pulses are 2+ bilaterally. Skin: Warm and dry without rash.  EKG: Sinus bradycardia with nonspecific T wave changes.  Lab Results  Component Value Date   WBC 5.7 02/02/2018   HGB 10.5 (L) 02/02/2018   HCT 31.5 (L) 02/02/2018   MCV 91.3 02/02/2018   PLT 195 02/02/2018    Lab Results  Component Value Date   NA 144 02/02/2018   K 5.2 02/02/2018   CL 109 02/02/2018   CO2 24 02/02/2018   BUN 21 02/02/2018   CREATININE 1.34 (H) 02/02/2018   GLUCOSE 86 02/02/2018   ALT 9 02/02/2018    Lab Results  Component Value Date   CHOL 191 07/16/2018   HDL 68.00 07/16/2018   LDLCALC 111 (H) 07/16/2018   LDLDIRECT 104.4 11/26/2012   TRIG 57.0 07/16/2018   CHOLHDL 3 07/16/2018    --------------------------------------------------------------------------------------------------  ASSESSMENT AND PLAN: Persistent atrial fibrillation No symptoms to suggest recurrence.  EKG today demonstrates sinus bradycardia.  We will continue indefinite anticoagulation with dabigatran and pindolol, as patient is asymptomatic with her long-standing sinus bradycardia.  Given long-standing anticoagulation and anemia noted in April, repeat CBC to ensure stable hemoglobin should be considered when [name] follows up with her PCP later this month.  Valvular heart disease Echo in 09/2017 showed mild aortic and moderate mitral regurgitation as well as aortic sclerosis.  [name] does not have any heart failure symptoms.  We will plan to repeat an echocardiogram shortly before she returns to see me  in a year.  Nonobstructive coronary artery disease No symptoms to suggest worsening coronary insufficiency.  Continue medical therapy.  LDL is a little bit above goal; I will defer starting a statin to her PCP to achieve an LDL less than 100.  Hypertension Blood pressure mildly elevated today.  I encouraged sodium restriction.  I will have Ms. he cannot continue current doses of pindolol and spironolactone.  I have recommended repeating a basic metabolic panel today, given chronic kidney disease and borderline hyperkalemia on last check in April.  [name] is scheduled to follow-up with her PCP, Dr. Diona Browner, in 1 week and wishes to have labs drawn at that time.  Follow-up: Return to clinic in 1 year.  Nelva Bush, MD 07/19/2018 9:44 AM

## 2018-07-26 ENCOUNTER — Ambulatory Visit (INDEPENDENT_AMBULATORY_CARE_PROVIDER_SITE_OTHER): Payer: Medicare Other | Admitting: Family Medicine

## 2018-07-26 ENCOUNTER — Encounter: Payer: Self-pay | Admitting: Family Medicine

## 2018-07-26 VITALS — BP 137/62 | HR 44 | Temp 97.6°F | Ht 62.75 in | Wt 145.8 lb

## 2018-07-26 DIAGNOSIS — D508 Other iron deficiency anemias: Secondary | ICD-10-CM | POA: Diagnosis not present

## 2018-07-26 DIAGNOSIS — N183 Chronic kidney disease, stage 3 unspecified: Secondary | ICD-10-CM

## 2018-07-26 DIAGNOSIS — Z8739 Personal history of other diseases of the musculoskeletal system and connective tissue: Secondary | ICD-10-CM | POA: Diagnosis not present

## 2018-07-26 DIAGNOSIS — I480 Paroxysmal atrial fibrillation: Secondary | ICD-10-CM | POA: Diagnosis not present

## 2018-07-26 DIAGNOSIS — E559 Vitamin D deficiency, unspecified: Secondary | ICD-10-CM

## 2018-07-26 DIAGNOSIS — E782 Mixed hyperlipidemia: Secondary | ICD-10-CM

## 2018-07-26 DIAGNOSIS — I10 Essential (primary) hypertension: Secondary | ICD-10-CM

## 2018-07-26 DIAGNOSIS — Z0000 Encounter for general adult medical examination without abnormal findings: Secondary | ICD-10-CM

## 2018-07-26 NOTE — Assessment & Plan Note (Signed)
Followed by Cards.  On anticoag and rate controlled ( asymptomatic bradycardia)

## 2018-07-26 NOTE — Assessment & Plan Note (Signed)
Continues to refuse DMARD given asymptomatic. Not clearly correct dx.

## 2018-07-26 NOTE — Assessment & Plan Note (Signed)
Resolved

## 2018-07-26 NOTE — Assessment & Plan Note (Signed)
Needs re-eval of GFR given on spironolactone. Euvolemic today.

## 2018-07-26 NOTE — Assessment & Plan Note (Signed)
Well controlled. Continue current medication.  

## 2018-07-26 NOTE — Patient Instructions (Addendum)
Call Dr. Eugenia Pancoast office to set up colonoscopy [phone]. Call to set up mammogram on your own.   Please stop at the lab to have labs drawn.

## 2018-07-26 NOTE — Addendum Note (Signed)
Addended by: Eliezer Lofts E on: 07/26/2018 02:10 PM   Modules accepted: Level of Service

## 2018-07-26 NOTE — Progress Notes (Signed)
Subjective:    Patient ID: [name], female    DOB: August 29, 1941, 77 y.o.   MRN: [id number]  HPI  The patient presents for  complete physical and review of chronic health problems. He/She also has the following acute concerns today:none  The patient saw Candis Musa, LPN for medicare wellness. Note reviewed in detail and important notes copied below.  Health maintenance:  Flu vaccine - administered  Abnormal screenings:   None  Patient concerns:   Refill for Lotrimin cream requested to be sent to [facility]  07/26/18 Today: Persistent Afib, valvular heart disease, CAD: followed by D.r End Reviewed last OV from 07/19/2018 Bradycardia.. Asymptomatic from medication.  Euvolemic today.   Vit D def nml.. On supplement.  CKD: due for labs on spironlactone  Hypertension:Good control Using medication without problems or lightheadedness: none Chest pain with exertion:none Edema:none Short of breath: off and on Average home BPs: Other issues:  Elevated Cholesterol:  Statin indicated, but improved cholesterol from last year with diet. Lab Results  Component Value Date   CHOL 191 07/16/2018   HDL 68.00 07/16/2018   LDLCALC 111 (H) 07/16/2018   LDLDIRECT 104.4 11/26/2012   TRIG 57.0 07/16/2018   CHOLHDL 3 07/16/2018  Using medications without problems: Muscle aches:  Diet compliance: Exercise: Other complaints:   Social History /Family History/Past Medical History reviewed in detail and updated in EMR if needed. Blood pressure 137/62, pulse (!) 44, temperature 97.6 F (36.4 C), temperature source Oral, height 5' 2.75" (1.594 m), weight 145 lb 12 oz (66.1 kg).  Review of Systems  Constitutional: Negative for fatigue and fever.  HENT: Negative for congestion.   Eyes: Negative for pain.  Respiratory: Negative for cough and shortness of breath.   Cardiovascular: Negative for chest pain, palpitations and leg swelling.  Gastrointestinal:  Negative for abdominal pain.  Genitourinary: Negative for dysuria and vaginal bleeding.  Musculoskeletal: Negative for back pain.  Neurological: Negative for syncope, light-headedness and headaches.  Psychiatric/Behavioral: Negative for dysphoric mood.       Objective:   Physical Exam  Constitutional: Vital signs are normal. She appears well-developed and well-nourished. She is cooperative.  Non-toxic appearance. She does not appear ill. No distress.  HENT:  Head: Normocephalic.  Right Ear: Hearing, tympanic membrane, external ear and ear canal normal.  Left Ear: Hearing, tympanic membrane, external ear and ear canal normal.  Nose: Nose normal.  Eyes: Pupils are equal, round, and reactive to light. Conjunctivae, EOM and lids are normal. Lids are everted and swept, no foreign bodies found.  Neck: Trachea normal and normal range of motion. Neck supple. Carotid bruit is not present. No thyroid mass and no thyromegaly present.  Cardiovascular: Normal rate, regular rhythm, S1 normal, S2 normal and intact distal pulses. Exam reveals no gallop.  Murmur heard. Pulmonary/Chest: Effort normal and breath sounds normal. No respiratory distress. She has no wheezes. She has no rhonchi. She has no rales.  Abdominal: Soft. Normal appearance and bowel sounds are normal. She exhibits no distension, no fluid wave, no abdominal bruit and no mass. There is no hepatosplenomegaly. There is no tenderness. There is no rebound, no guarding and no CVA tenderness. No hernia.  Lymphadenopathy:    She has no cervical adenopathy.    She has no axillary adenopathy.  Neurological: She is alert. She has normal strength. No cranial nerve deficit or sensory deficit.  Skin: Skin is warm, dry and intact. No rash noted.  Psychiatric: Her speech is normal and  behavior is normal. Judgment normal. Her mood appears not anxious. Cognition and memory are normal. She does not exhibit a depressed mood.          Assessment &  Plan:  The patient's preventative maintenance and recommended screening tests for an annual wellness exam were reviewed in full today. Brought up to date unless services declined.  Counselled on the importance of diet, exercise, and its role in overall health and mortality. The patient's FH and SH was reviewed, including their home life, tobacco status, and drug and alcohol status.   Osteopenia: last DEXA 2016, stable, osteopenia repeat planned in 5 years  Colonoscopy: 2009 Dr.Jacobs, repeat in 2019  Partial Hysterectomy: no pap indicated, no DVE indicated. No family or personal history of ovarian cancer, asymptomatic.  Vaccines: Uptodate, flu given  At [facility],Postopned zostavax, tdap. Mammogram: 03/2017 nml.. due Nonsmoker  No ETOH, no drugs

## 2018-07-26 NOTE — Assessment & Plan Note (Signed)
Due for re-eval. 

## 2018-07-26 NOTE — Assessment & Plan Note (Signed)
LDL above goal with CVD risk, but improved with lifestly ein last year. Encouraged exercise, weight loss, healthy eating habits.

## 2018-07-26 NOTE — Addendum Note (Signed)
Addended by: Carter Kitten on: 07/26/2018 03:28 PM   Modules accepted: Orders

## 2018-07-27 ENCOUNTER — Other Ambulatory Visit (INDEPENDENT_AMBULATORY_CARE_PROVIDER_SITE_OTHER): Payer: Medicare Other

## 2018-07-27 DIAGNOSIS — I10 Essential (primary) hypertension: Secondary | ICD-10-CM | POA: Diagnosis not present

## 2018-07-27 LAB — CBC WITH DIFFERENTIAL/PLATELET
BASOS PCT: 1.2 % (ref 0.0–3.0)
Basophils Absolute: 0.1 10*3/uL (ref 0.0–0.1)
EOS PCT: 3.2 % (ref 0.0–5.0)
Eosinophils Absolute: 0.2 10*3/uL (ref 0.0–0.7)
HEMATOCRIT: 34 % — AB (ref 36.0–46.0)
HEMOGLOBIN: 11.2 g/dL — AB (ref 12.0–15.0)
LYMPHS PCT: 30.6 % (ref 12.0–46.0)
Lymphs Abs: 1.7 10*3/uL (ref 0.7–4.0)
MCHC: 33 g/dL (ref 30.0–36.0)
MCV: 94 fl (ref 78.0–100.0)
MONOS PCT: 7.5 % (ref 3.0–12.0)
Monocytes Absolute: 0.4 10*3/uL (ref 0.1–1.0)
Neutro Abs: 3.2 10*3/uL (ref 1.4–7.7)
Neutrophils Relative %: 57.5 % (ref 43.0–77.0)
Platelets: 199 10*3/uL (ref 150.0–400.0)
RBC: 3.62 Mil/uL — ABNORMAL LOW (ref 3.87–5.11)
RDW: 13.3 % (ref 11.5–15.5)
WBC: 5.5 10*3/uL (ref 4.0–10.5)

## 2018-07-27 LAB — BASIC METABOLIC PANEL
BUN: 27 mg/dL — AB (ref 6–23)
CALCIUM: 9.8 mg/dL (ref 8.4–10.5)
CO2: 27 meq/L (ref 19–32)
Chloride: 108 mEq/L (ref 96–112)
Creatinine, Ser: 1.42 mg/dL — ABNORMAL HIGH (ref 0.40–1.20)
GFR: 46.24 mL/min — AB (ref >60.00)
GLUCOSE: 82 mg/dL (ref 70–99)
Potassium: 4.5 mEq/L (ref 3.5–5.1)
Sodium: 142 mEq/L (ref 135–145)

## 2018-08-30 ENCOUNTER — Other Ambulatory Visit: Payer: Self-pay | Admitting: Internal Medicine

## 2018-09-03 DIAGNOSIS — H35341 Macular cyst, hole, or pseudohole, right eye: Secondary | ICD-10-CM | POA: Diagnosis not present

## 2018-09-27 ENCOUNTER — Other Ambulatory Visit: Payer: Self-pay | Admitting: Physician Assistant

## 2018-10-04 ENCOUNTER — Other Ambulatory Visit: Payer: Self-pay | Admitting: Internal Medicine

## 2018-10-05 ENCOUNTER — Telehealth: Payer: Self-pay | Admitting: Internal Medicine

## 2018-10-05 ENCOUNTER — Other Ambulatory Visit: Payer: Self-pay | Admitting: *Deleted

## 2018-10-05 MED ORDER — DABIGATRAN ETEXILATE MESYLATE 150 MG PO CAPS: ORAL_CAPSULE | ORAL | 5 refills | Status: DC

## 2018-10-05 NOTE — Telephone Encounter (Signed)
Requested Prescriptions   Signed Prescriptions Disp Refills  . dabigatran (PRADAXA) 150 MG CAPS capsule 60 capsule 5    Sig: TAKE 1 CAPSULE BY MOUTH EVERY 12 HOURS    Authorizing Provider: END, CHRISTOPHER    Ordering User: Britt Bottom

## 2018-10-05 NOTE — Telephone Encounter (Signed)
Patient at Pharmacy  *STAT* If patient is at the pharmacy, call can be transferred to refill team.   1. Which medications need to be refilled? (please list name of each medication and dose if known) Pradaxa 150 MG - 1 capsule every 12 hours   2. Which pharmacy/location (including street and city if local pharmacy) is medication to be sent to? [facility]   3. Do they need a 30 day or 90 day supply? 30 day

## 2018-10-12 DIAGNOSIS — H401111 Primary open-angle glaucoma, right eye, mild stage: Secondary | ICD-10-CM | POA: Diagnosis not present

## 2018-10-18 ENCOUNTER — Telehealth: Payer: Self-pay | Admitting: Internal Medicine

## 2018-10-18 NOTE — Telephone Encounter (Signed)
Patient states her insurance will no longer cover Pradaxa.  Her insurance will approve Eliquis or Xarelto.  She has enough Pradaxa for this month but will need to change afterward if able. Routing to Dr End for review.

## 2018-10-18 NOTE — Telephone Encounter (Signed)
Pt c/o medication issue:  1. Name of Medication: Pradaxa  2. How are you currently taking this medication (dosage and times per day)? 150 MG 1 capsule every 12 hours  3. Are you having a reaction (difficulty breathing--STAT)? No   4. What is your medication issue? Patient states that medication is no longer covered by insurance and would like to know if there is something else that can be prescribed.  Please call to discuss.

## 2018-10-19 NOTE — Telephone Encounter (Signed)
When she has exhausted her current supply of Pradaxa, I recommend starting Eliquis 5 mg BID.  Nelva Bush, MD [facility] Pager: [phone]

## 2018-10-22 MED ORDER — APIXABAN 5 MG PO TABS: 5.0000 mg | ORAL_TABLET | Freq: Two times a day (BID) | ORAL | 2 refills | Status: DC

## 2018-10-22 NOTE — Telephone Encounter (Signed)
Patient verbalized understanding. States she has a week or two of Pradaxa left. Rx for Eliquis sent to pharmacy and she knows to start only once done with Pradaxa.

## 2018-10-31 ENCOUNTER — Other Ambulatory Visit: Payer: Self-pay

## 2018-10-31 MED ORDER — APIXABAN 5 MG PO TABS: 5.0000 mg | ORAL_TABLET | Freq: Two times a day (BID) | ORAL | 0 refills | Status: DC

## 2018-10-31 NOTE — Telephone Encounter (Signed)
Please review for refill, Thanks !  

## 2018-11-13 ENCOUNTER — Other Ambulatory Visit: Payer: Self-pay | Admitting: Physician Assistant

## 2018-11-14 ENCOUNTER — Other Ambulatory Visit: Payer: Self-pay | Admitting: Physician Assistant

## 2018-11-20 ENCOUNTER — Other Ambulatory Visit: Payer: Self-pay | Admitting: Physician Assistant

## 2018-11-20 NOTE — Telephone Encounter (Signed)
*  STAT* If patient is at the pharmacy, call can be transferred to refill team.   1. Which medications need to be refilled? (please list name of each medication and dose if known) Spironolactone  2. Which pharmacy/location (including street and city if local pharmacy) is medication to be sent to? [facility]  3. Do they need a 30 day or 90 day supply? 90 Pt is completely out

## 2018-12-28 ENCOUNTER — Telehealth: Payer: Self-pay | Admitting: Physician Assistant

## 2018-12-28 NOTE — Telephone Encounter (Signed)
Pt states her pharmacy is unable to get pindolol. Please call to discuss alternative.

## 2018-12-31 MED ORDER — PINDOLOL 5 MG PO TABS: ORAL_TABLET | ORAL | 3 refills | Status: DC

## 2018-12-31 NOTE — Telephone Encounter (Signed)
Returned the call to the patient. She would like for her pindolol to be sent to [facility]. She will call back with the one she would like for it to be sent to.

## 2018-12-31 NOTE — Telephone Encounter (Signed)
Patient called with [facility] information. The prescription has been sent in for her.

## 2019-01-08 ENCOUNTER — Telehealth: Payer: Self-pay

## 2019-01-08 MED ORDER — SPIRONOLACTONE 25 MG PO TABS: 25.0000 mg | ORAL_TABLET | Freq: Every day | ORAL | 3 refills | Status: DC

## 2019-01-08 NOTE — Telephone Encounter (Signed)
Changed RX to Mail order sent to [facility].

## 2019-01-12 ENCOUNTER — Telehealth: Payer: Self-pay | Admitting: Internal Medicine

## 2019-01-12 NOTE — Telephone Encounter (Signed)
Patient called    SHe has been out of pindolol 5 bid for about a week   Can't get refill on   Needs to go to something else  Overall feels OK   Does not take BP   Rare palpitaitons   REcomm   Will call in metoprolol 25 bid    (normal conversion for pindolol 5 bid is 50 bid metoprolol) I have asked her to take BP and HR over next week Someone will call her to review    Titrate depending on readings  [facility]  1 month  No refill

## 2019-01-14 ENCOUNTER — Telehealth: Payer: Self-pay | Admitting: Internal Medicine

## 2019-01-14 NOTE — Telephone Encounter (Signed)
Patient scheduled 4/20 with Dr [name]

## 2019-01-14 NOTE — Telephone Encounter (Signed)
Thank you for the update, Nevin Bloodgood.  Anderson Malta, can you set up a virtual visit for [name] in ~2 weeks to make sure that she is tolerating metoprolol well?  Thanks.  Gerald Stabs

## 2019-01-14 NOTE — Telephone Encounter (Signed)
Attempted to call patient. No answer and no voicemail picked up. Will try again later.

## 2019-01-14 NOTE — Telephone Encounter (Signed)
Virtual Visit Pre-Appointment Phone Call  Steps For Call:  1. Confirm consent - "In the setting of the current Covid19 crisis, you are scheduled for a (phone or video) visit with your provider on (date) at (time).  Just as we do with many in-office visits, in order for you to participate in this visit, we must obtain consent.  If you'd like, I can send this to your mychart (if signed up) or email for you to review.  Otherwise, I can obtain your verbal consent now.  All virtual visits are billed to your insurance company just like a normal visit would be.  By agreeing to a virtual visit, we'd like you to understand that the technology does not allow for your provider to perform an examination, and thus may limit your provider's ability to fully assess your condition.  Finally, though the technology is pretty good, we cannot assure that it will always work on either your or our end, and in the setting of a video visit, we may have to convert it to a phone-only visit.  In either situation, we cannot ensure that we have a secure connection.  Are you willing to proceed?"  2. Give patient instructions for WebEx download to smartphone as below if video visit  3. Advise patient to be prepared with any vital sign or heart rhythm information, their current medicines, and a piece of paper and pen handy for any instructions they may receive the day of their visit  4. Inform patient they will receive a phone call 15 minutes prior to their appointment time (may be from unknown caller ID) so they should be prepared to answer  5. Confirm that appointment type is correct in Epic appointment notes (video vs telephone)    TELEPHONE CALL NOTE  [name] has been deemed a candidate for a follow-up tele-health visit to limit community exposure during the Covid-19 pandemic. I spoke with the patient via phone to ensure availability of phone/video source, confirm preferred email & phone number, and discuss  instructions and expectations.  I reminded [name] to be prepared with any vital sign and/or heart rhythm information that could potentially be obtained via home monitoring, at the time of her visit. I reminded [name] to expect a phone call at the time of her visit if her visit.  Did the patient verbally acknowledge consent to treatment? Yes   Ace Gins 01/14/2019 10:11 AM   DOWNLOADING THE [place]  - If Apple, go to [employer] and type in WebEx in the search bar. [place] [employer], the blue/green circle. The app is free but as with any other app downloads, their phone may require them to verify saved payment information or Apple password. The patient does NOT have to create an account.  - If Android, ask patient to go to [employer] and type in WebEx in the search bar. [place] [employer], the blue/green circle. The app is free but as with any other app downloads, their phone may require them to verify saved payment information or Android password. The patient does NOT have to create an account.   CONSENT FOR TELE-HEALTH VISIT - PLEASE REVIEW  I hereby voluntarily request, consent and authorize [facility] and its employed or contracted physicians, physician assistants, nurse practitioners or other licensed health care professionals (the Practitioner), to provide me with telemedicine health care services (the Services") as deemed necessary by the treating Practitioner. I  acknowledge and consent to receive the Services by the Practitioner via telemedicine. I understand that the telemedicine visit will involve communicating with the Practitioner through live audiovisual communication technology and the disclosure of certain medical information by electronic transmission. I acknowledge that I have been given the opportunity to request an in-person assessment or other available alternative prior to the telemedicine visit and  am voluntarily participating in the telemedicine visit.  I understand that I have the right to withhold or withdraw my consent to the use of telemedicine in the course of my care at any time, without affecting my right to future care or treatment, and that the Practitioner or I may terminate the telemedicine visit at any time. I understand that I have the right to inspect all information obtained and/or recorded in the course of the telemedicine visit and may receive copies of available information for a reasonable fee.  I understand that some of the potential risks of receiving the Services via telemedicine include:   Delay or interruption in medical evaluation due to technological equipment failure or disruption;  Information transmitted may not be sufficient (e.g. poor resolution of images) to allow for appropriate medical decision making by the Practitioner; and/or   In rare instances, security protocols could fail, causing a breach of personal health information.  Furthermore, I acknowledge that it is my responsibility to provide information about my medical history, conditions and care that is complete and accurate to the best of my ability. I acknowledge that Practitioner's advice, recommendations, and/or decision may be based on factors not within their control, such as incomplete or inaccurate data provided by me or distortions of diagnostic images or specimens that may result from electronic transmissions. I understand that the practice of medicine is not an exact science and that Practitioner makes no warranties or guarantees regarding treatment outcomes. I acknowledge that I will receive a copy of this consent concurrently upon execution via email to the email address I last provided but may also request a printed copy by calling the office of [facility].    I understand that my insurance will be billed for this visit.   I have read or had this consent read to me.  I understand the  contents of this consent, which adequately explains the benefits and risks of the Services being provided via telemedicine.   I have been provided ample opportunity to ask questions regarding this consent and the Services and have had my questions answered to my satisfaction.  I give my informed consent for the services to be provided through the use of telemedicine in my medical care  By participating in this telemedicine visit I agree to the above.

## 2019-01-21 ENCOUNTER — Ambulatory Visit (INDEPENDENT_AMBULATORY_CARE_PROVIDER_SITE_OTHER)
Admission: RE | Admit: 2019-01-21 | Discharge: 2019-01-21 | Disposition: A | Discharge status: Home / Self Care | Payer: Medicare Other | Source: Ambulatory Visit | Attending: Family Medicine | Admitting: Family Medicine

## 2019-01-21 ENCOUNTER — Other Ambulatory Visit: Payer: Self-pay

## 2019-01-21 ENCOUNTER — Encounter: Payer: Self-pay | Admitting: Family Medicine

## 2019-01-21 ENCOUNTER — Ambulatory Visit (INDEPENDENT_AMBULATORY_CARE_PROVIDER_SITE_OTHER): Payer: Medicare Other | Admitting: Family Medicine

## 2019-01-21 VITALS — BP 158/88 | HR 48 | Temp 98.9°F | Ht 62.75 in | Wt 150.2 lb

## 2019-01-21 DIAGNOSIS — S76019A Strain of muscle, fascia and tendon of unspecified hip, initial encounter: Secondary | ICD-10-CM

## 2019-01-21 DIAGNOSIS — R29898 Other symptoms and signs involving the musculoskeletal system: Secondary | ICD-10-CM

## 2019-01-21 DIAGNOSIS — N183 Chronic kidney disease, stage 3 unspecified: Secondary | ICD-10-CM

## 2019-01-21 DIAGNOSIS — Z7902 Long term (current) use of antithrombotics/antiplatelets: Secondary | ICD-10-CM | POA: Diagnosis not present

## 2019-01-21 DIAGNOSIS — M25552 Pain in left hip: Secondary | ICD-10-CM

## 2019-01-21 DIAGNOSIS — M1612 Unilateral primary osteoarthritis, left hip: Secondary | ICD-10-CM | POA: Diagnosis not present

## 2019-01-21 IMAGING — DX DG HIP (WITH OR WITHOUT PELVIS) 2-3V LEFT
3 series · 3 of 3 positions shown · non-contrast
Comparison: None.

CLINICAL DATA: Assess for osteoarthritis.

EXAM:
DG HIP (WITH OR WITHOUT PELVIS) 2-3V LEFT

[pelvis ap]
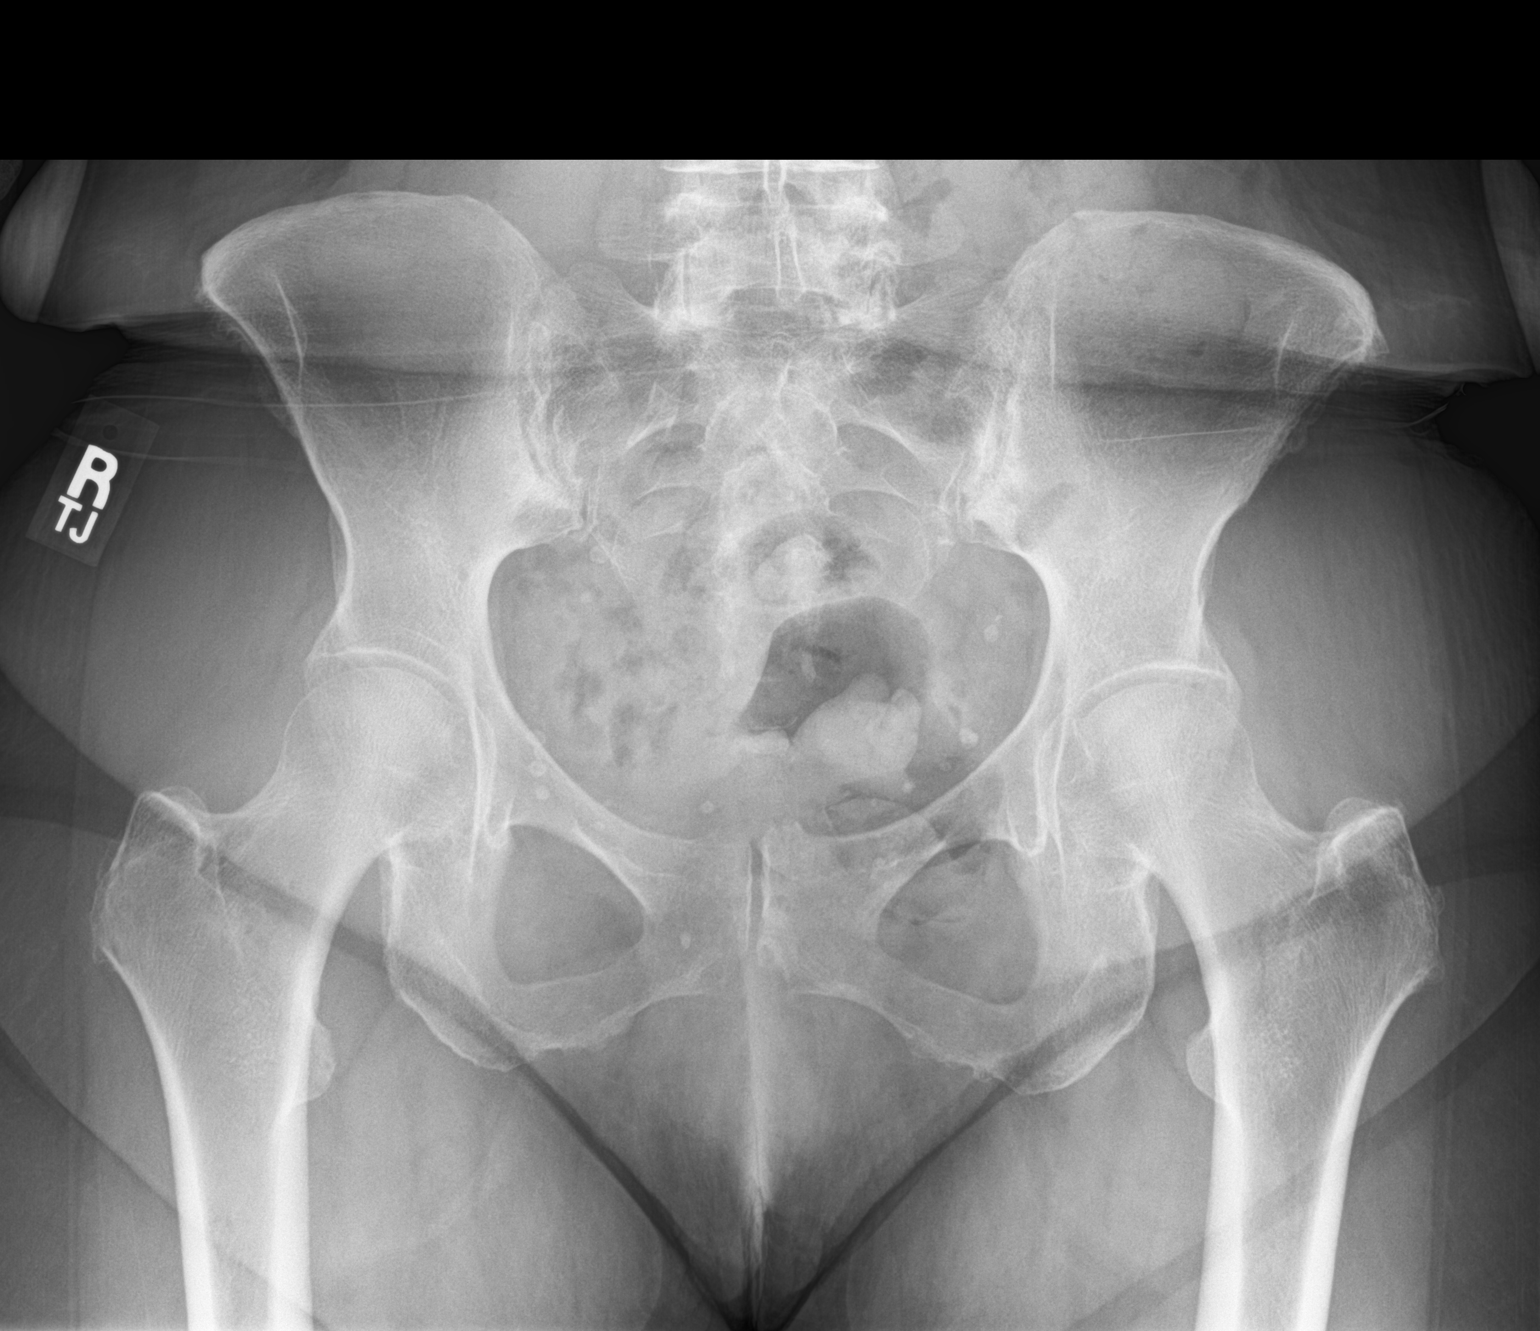

[hip ap]
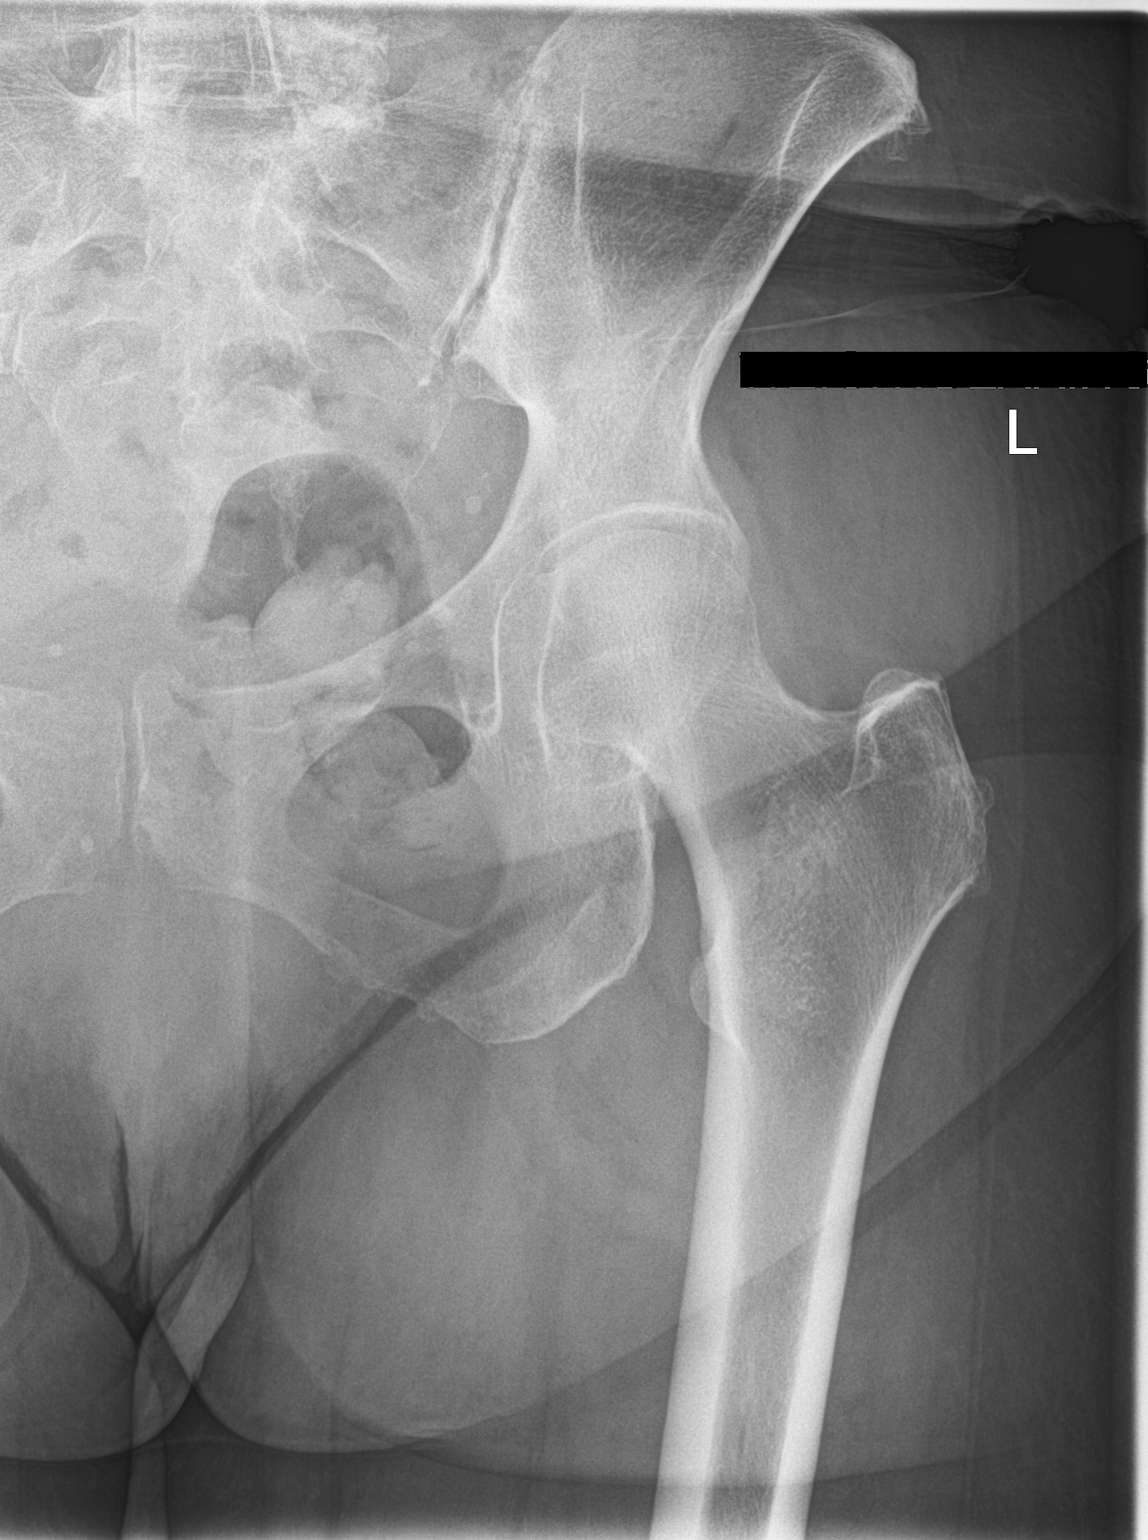

[hip lat]
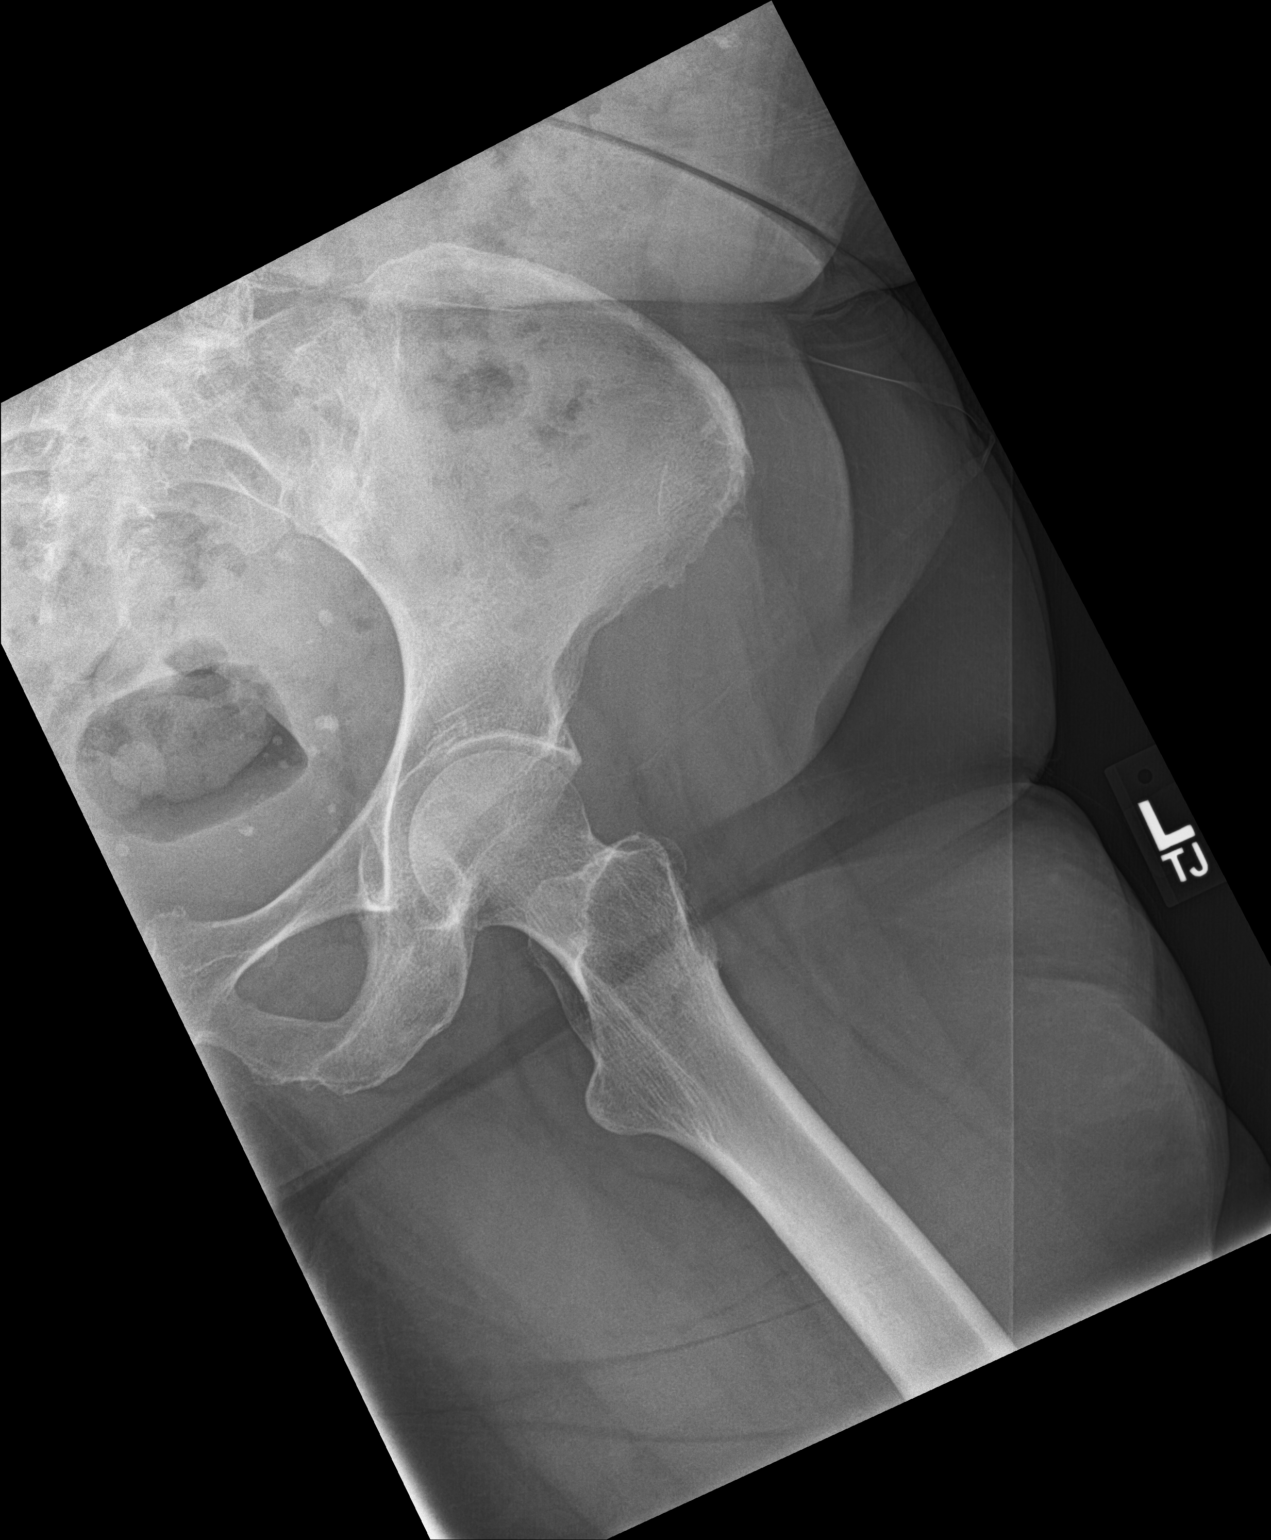

[3 of 3 positions shown; findings below may reference images not displayed]

FINDINGS: No acute fracture. No dislocation. Hip joint spaces are well
maintained. Minimal juxta-articular sclerosis in the superior left
acetabulum. There is moderate juxta-articular sclerosis about the SI
joints bilaterally left greater than right compatible with
osteoarthritic change. Bone mineral density is within normal limits.
No erosive changes.
IMPRESSION: Minimal degenerative change of the left hip joint. Moderate
degenerative change of the SI joints left greater than right. No
acute bony pathology.

## 2019-01-21 NOTE — Progress Notes (Signed)
Jacqueline Rossa T. [name], MD Primary Care and [place] at [facility] [place], [address] Phone: [phone]  FAX: [phone]  [name] - 78 y.o. female  MRN [id number]  Date of Birth: 1941/09/09  Visit Date: 01/21/2019  PCP: Jacqueline Sanders, MD  Referred by: Jacqueline Sanders, MD  Chief Complaint  Patient presents with  . Hip Pain    Left x 1 month   Subjective:   [name] is a 78 y.o. very pleasant female patient who presents with the following:  L hip pain: She thinks that this is been bothering her for about 1 month.  She did alter her lifting routine at the gym, and she routinely works out, she used to work out with a Clinical research associate.  She is having pain with walking, she was having some pain with exercise before they closed the gym stay.  Some pain with going up and down the stairs.  It is mostly posterior lateral.  She denies any groin pain.  She is not really having any current back pain.  No radicular symptoms.  She is having some mild medial knee pain.  L lateral hip pain.  Moving and getting up was bothering her a lot.  Maybe longer.  Painful with walking.  Maybe exercising.  With exercising, Was doing new leg ext.  3 - 3+  Past Medical History, Surgical History, Social History, Family History, Problem List, Medications, and Allergies have been reviewed and updated if relevant.  Patient Active Problem List   Diagnosis Date Noted  . Persistent atrial fibrillation 07/19/2018  . Peripheral edema 02/02/2018  . Acute nonintractable headache 02/02/2018  . Coronary artery disease, non-occlusive 09/14/2017  . PAF (paroxysmal atrial fibrillation) ([place]) 09/14/2017  . Status post ablation of atrial fibrillation 09/14/2017  . Essential hypertension 09/14/2017  . Tinea pedis 07/14/2017  . 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/15/2016  . CKD (chronic kidney disease) stage 3, GFR 30-59 ml/min (HCC)  03/14/2016  . History of rheumatoid arthritis 03/14/2016  . Mitral regurgitation 12/30/2015  . Obesity 12/02/2015  . Aortic valve insufficiency 12/02/2015  . Vitamin D deficiency 04/17/2015  . Counseling regarding end of life decision making 04/17/2015  . Allergic rhinitis 02/03/2015  . Aortic valve stenosis, mild 08/19/2010  . Osteopenia 05/11/2010  . SINUS BRADYCARDIA 08/19/2009  . Anemia, iron deficiency 12/13/2007  . HYPERLIPIDEMIA 08/23/2007  . UNSPECIFIED GLAUCOMA 10/10/2006    Past Medical History:  Diagnosis Date  . Anemia   . Aortic insufficiency    a. echo 11/11: EF 55-60%, mod LVH, mild AS (mean 12), mild AI, mild MR, mild LAE  . Coronary artery disease, non-occlusive    a. cath 2/09: no CAD, EF 70%  . HYPERLIPIDEMIA   . HYPERTENSION   . OSTEOPENIA   . PAF (paroxysmal atrial fibrillation) ([place])    a. s/p ablation 2013; b. on pradaxa; c. CHADS2VASc => 4 (HTN, age x 2, female)  . Pneumonia 2009  . RA (rheumatoid arthritis) ([place])   . SINUS BRADYCARDIA    a. asymptomatic   . Unspecified glaucoma(365.9)     Past Surgical History:  Procedure Laterality Date  . ABLATION OF DYSRHYTHMIC FOCUS    . CARDIAC CATHETERIZATION    . COLONOSCOPY  04/18/08  . Partial hysterectomy--1979    . Thoracentesis   12/20/07      Social History   Socioeconomic History  . Marital status: Widowed    Spouse  name: Not on file  . Number of children: Not on file  . Years of education: Not on file  . Highest education level: Not on file  Occupational History  . Not on file  Social Needs  . Financial resource strain: Not on file  . Food insecurity:    Worry: Not on file    Inability: Not on file  . Transportation needs:    Medical: Not on file    Non-medical: Not on file  Tobacco Use  . Smoking status: Never Smoker  . Smokeless tobacco: Never Used  Substance and Sexual Activity  . Alcohol use: No    Alcohol/week: 0.0 standard drinks  . Drug use: No  . Sexual activity: Never   Lifestyle  . Physical activity:    Days per week: Not on file    Minutes per session: Not on file  . Stress: Not on file  Relationships  . Social connections:    Talks on phone: Not on file    Gets together: Not on file    Attends religious service: Not on file    Active member of club or organization: Not on file    Attends meetings of clubs or organizations: Not on file    Relationship status: Not on file  . Intimate partner violence:    Fear of current or ex partner: Not on file    Emotionally abused: Not on file    Physically abused: Not on file    Forced sexual activity: Not on file  Other Topics Concern  . 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 Full Code ( reviewed 2015)       Occasional exercise.   Diet: fruits and veggies, lean meats.    Family History  Problem Relation Age of Onset  . Diabetes Unknown   . Breast cancer Paternal Aunt 46  . Heart disease Mother   . Pancreatic cancer Father   . Atrial fibrillation Brother     Allergies  Allergen Reactions  . Celecoxib Other (See Comments)    Tachycardia/palpitations  . Tramadol Nausea Only    Medication list reviewed and updated in full in [place].  GEN: No fevers, chills. Nontoxic. Primarily MSK c/o today. MSK: Detailed in the HPI GI: tolerating PO intake without difficulty Neuro: No numbness, parasthesias, or tingling associated. Otherwise the pertinent positives of the ROS are noted above.   Objective:   BP (!) 158/88   Pulse (!) 48   Temp 98.9 F (37.2 C) (Oral)   Ht 5' 2.75" (1.594  m)   Wt 150 lb 4 oz (68.2 kg)   BMI 26.83 kg/m    GEN: WDWN, NAD, Non-toxic, Alert & Oriented x 3 HEENT: Atraumatic, Normocephalic.  Ears and Nose: No external deformity. EXTR: No clubbing/cyanosis/edema NEURO: Normal gait.  PSYCH: Normally interactive. Conversant. Not depressed or anxious appearing.  Calm demeanor.   HIP EXAM: SIDE: B ROM: Abduction, Flexion, Internal and External range of motion: Grossly full range of motion on the right with approximately 20-30 degrees of rotational movement on the left with the hip flexed to 90 degrees Pain  with terminal IROM and EROM: modest GTB: NT SLR: NEG Knees: No effusion, mild medial joint line tenderness on the L FABER: NT REVERSE FABER: NT, neg Piriformis: NT at direct palpation, but tender along posterior pelvic rim on the L Str: flexion: 3/5 abduction: 3+/5 adduction: 3/5 Strength testing tender     Radiology: No results found.  Assessment and Plan:   Tear of gluteus medius tendon  Acute hip pain, left - Plan: DG Hip Unilat W OR W/O Pelvis 2-3 Views Left  Weakness of left hip  >25 minutes spent in face to face time with patient, >50% spent in counselling or coordination of care   Remarkable bilateral hip weakness.  I think that this is the primary problem, and she does have some secondary gluteus medius tendinopathy now and left knee is also inflamed currently.  Without correcting her dramatic hip weakness, I do not think that the biomechanical instigator of her pain will be corrected.  I gave her a fairly straightforward hip program from the [employer].  Follow-up: Return in about 2 months (around 03/23/2019).  Orders Placed This Encounter  Procedures  . DG Hip Unilat W OR W/O Pelvis 2-3 Views Left    Signed,  Jailey Booton T. Alessandria Henken, MD   Outpatient Encounter Medications as of 01/21/2019  Medication Sig  . apixaban (ELIQUIS) 5 MG TABS tablet Take 1 tablet (5 mg total) by mouth 2 (two)  times daily.  . B Complex Vitamins (VITAMIN B COMPLEX) TABS Take 1 tablet by mouth daily.   . Calcium Carb-Cholecalciferol (CALCIUM 1000 + D) 1000-800 MG-UNIT TABS Take 1 tablet by mouth daily.  . clotrimazole (LOTRIMIN) 1 % cream Apply 1 application topically 2 (two) times daily.  . metoprolol tartrate (LOPRESSOR) 25 MG tablet Take 25 mg by mouth 2 (two) times daily.  Marland Kitchen spironolactone (ALDACTONE) 25 MG tablet Take 1 tablet (25 mg total) by mouth daily.  . [DISCONTINUED] pindolol (VISKEN) 5 MG tablet TAKE ONE (1) TABLET BY MOUTH TWO TIMES PER DAY   No facility-administered encounter medications on file as of 01/21/2019.

## 2019-01-26 NOTE — Progress Notes (Signed)
Virtual Visit via Video Note   This visit type was conducted due to national recommendations for restrictions regarding the COVID-19 Pandemic (e.g. social distancing) in an effort to limit this patient's exposure and mitigate transmission in our community.  Due to her co-morbid illnesses, this patient is at least at moderate risk for complications without adequate follow up.  This format is felt to be most appropriate for this patient at this time.  All issues noted in this document were discussed and addressed.  A limited physical exam was performed with this format.  Please refer to the patient's chart for her consent to telehealth for [facility].   Evaluation Performed:  Follow-up visit  Date:  01/28/2019   ID:  [name], [name] 02/12/1941, MRN [id number]  Patient Location: Home Provider Location: Home  PCP:  [name], MD  Cardiologist:  Nelva Bush, MD Electrophysiologist:  None   Chief Complaint: Follow-up hypertension and atrial fibrillation  History of Present Illness:    [name] is a 78 y.o. female with history of nonobstructive coronary artery disease, PAF status post ablation, asymptomatic bradycardia, mitral and aortic regurgitation, CKD stage II, HTN, and HLD.  I last saw her in October, at which time she was feeling well.  Blood pressure was suboptimally controlled, which [name] attributed to dietary indiscretion.  We did not make any medication changes at that time.  She was previously on pindolol, which had been started prior to my first meeting [name].  She was unable to procure this from her pharmacy and was therefore switched to metoprolol 25 mg twice daily after contacting the on-call provider (Dr. Harrington Challenger).  Today, [name] reports that she is feeling well.  She has tolerated switch from pindolol to metoprolol without any side effects.  She specifically denies palpitations, lightheadedness, chest pain, and shortness of breath.  She  intermittently checks her blood pressure and notes that her home blood pressures are typically 150-160/60-70.  She does not recall if she has been on any additional blood pressure medications in the past, aside from a beta-blocker and spironolactone.  The patient does not have symptoms concerning for COVID-19 infection (fever, chills, cough, or new shortness of breath).    Past Medical History:  Diagnosis Date  . Anemia   . Aortic insufficiency    a. echo 11/11: EF 55-60%, mod LVH, mild AS (mean 12), mild AI, mild MR, mild LAE  . Coronary artery disease, non-occlusive    a. cath 2/09: no CAD, EF 70%  . HYPERLIPIDEMIA   . HYPERTENSION   . OSTEOPENIA   . PAF (paroxysmal atrial fibrillation) ([place])    a. s/p ablation 2013; b. on pradaxa; c. CHADS2VASc => 4 (HTN, age x 2, female)  . Pneumonia 2009  . RA (rheumatoid arthritis) ([place])   . SINUS BRADYCARDIA    a. asymptomatic   . Unspecified glaucoma(365.9)    Past Surgical History:  Procedure Laterality Date  . ABLATION OF DYSRHYTHMIC FOCUS    . CARDIAC CATHETERIZATION    . COLONOSCOPY  04/18/08  . Partial hysterectomy--1979    . Thoracentesis   12/20/07       Current Meds  Medication Sig  . apixaban (ELIQUIS) 5 MG TABS tablet Take 1 tablet (5 mg total) by mouth 2 (two) times daily.  . B Complex Vitamins (VITAMIN B COMPLEX) TABS Take 1 tablet by mouth daily.   . Calcium Carb-Cholecalciferol (CALCIUM 1000 + D) 1000-800 MG-UNIT TABS Take 1 tablet  by mouth daily.  . clotrimazole (LOTRIMIN) 1 % cream Apply 1 application topically 2 (two) times daily.  . metoprolol tartrate (LOPRESSOR) 25 MG tablet Take 25 mg by mouth 2 (two) times daily.  Marland Kitchen spironolactone (ALDACTONE) 25 MG tablet Take 1 tablet (25 mg total) by mouth daily.     Allergies:   Celecoxib and Tramadol   Social History   Tobacco Use  . Smoking status: Never Smoker  . Smokeless tobacco: Never Used  Substance Use Topics  . Alcohol use: No    Alcohol/week: 0.0 standard  drinks  . Drug use: No     Family Hx: The patient's family history includes Atrial fibrillation in her brother; Breast cancer (age of onset: 8) in her paternal aunt; Diabetes in her unknown relative; Heart disease in her mother; Pancreatic cancer in her father.  ROS:   Please see the history of present illness.   All other systems reviewed and are negative.   Prior CV studies:   The following studies were reviewed today:  TTE (10/05/2017): Normal LV size with mild LVH.  LVEF 60-65% with normal wall motion and diastolic function.  Aortic sclerosis and mild aortic regurgitation.  Moderate mitral regurgitation.  Mild left atrial enlargement.  Labs/Other Tests and Data Reviewed:    EKG:  An ECG dated 07/19/2018 was personally reviewed today and demonstrated:  Sinus bradycardia with nonspecific T wave abnormality.  Recent Labs: 02/02/2018: ALT 9; TSH 1.52 07/27/2018: BUN 27; Creatinine, Ser 1.42; Hemoglobin 11.2; Platelets 199.0; Potassium 4.5; Sodium 142   Recent Lipid Panel Lab Results  Component Value Date/Time   CHOL 191 07/16/2018 12:49 PM   TRIG 57.0 07/16/2018 12:49 PM   HDL 68.00 07/16/2018 12:49 PM   CHOLHDL 3 07/16/2018 12:49 PM   LDLCALC 111 (H) 07/16/2018 12:49 PM   LDLDIRECT 104.4 11/26/2012 07:46 AM    Wt Readings from Last 3 Encounters:  01/28/19 147 lb (66.7 kg)  01/21/19 150 lb 4 oz (68.2 kg)  07/26/18 145 lb 12 oz (66.1 kg)     Objective:    Vital Signs:  BP (!) 170/68 (BP Location: Left Arm, Patient Position: Sitting, Cuff Size: Normal)   Ht 5\' 3"  (1.6 m)   Wt 147 lb (66.7 kg)   BMI 26.04 kg/m    VITAL SIGNS:  reviewed GEN:  no acute distress  ASSESSMENT & PLAN:    Persistent atrial fibrillation: [name] continues to do well and does not have symptoms of uncontrolled atrial fibrillation.  She was noted to be in sinus rhythm at our last visit.  She has a history of bradycardia in the past on beta-blockers, albeit without symptoms.  She seems to  be doing well on the transition from pindolol to metoprolol, which her pharmacy was no longer able to procure.  I will not make any medication changes today.  She will need to remain on apixaban for stroke prophylaxis.   Hypertension: Blood pressure not well controlled today nor for the past few readings in Epic.  I am reluctant to escalate metoprolol further, given history of bradycardia inability to assess heart rate today.  We will therefore start amlodipine 2.5 mg daily with uptitration as needed to achieve target blood pressure less than 140/90.  She will need a BMP at her follow-up visit, given longstanding spironolactone use.  COVID-19 Education: The signs and symptoms of COVID-19 were discussed with the patient and how to seek care for testing (follow up with PCP or arrange E-visit).  The  importance of social distancing was discussed today.  Time:   Today, I have spent 8 minutes with the patient with telehealth technology discussing the above problems.  An additional 10 minutes were spent reviewing the patient's chart and documenting today's encounter.   Medication Adjustments/Labs and Tests Ordered: Current medicines are reviewed at length with the patient today.  Concerns regarding medicines are outlined above.   Tests Ordered: No orders of the defined types were placed in this encounter.   Medication Changes: No orders of the defined types were placed in this encounter.   Disposition:  Follow up in 4 month(s)  Signed, Nelva Bush, MD  01/28/2019 2:05 PM    [facility]

## 2019-01-28 ENCOUNTER — Encounter: Payer: Self-pay | Admitting: Internal Medicine

## 2019-01-28 ENCOUNTER — Other Ambulatory Visit: Payer: Self-pay

## 2019-01-28 ENCOUNTER — Telehealth: Payer: Self-pay | Admitting: *Deleted

## 2019-01-28 ENCOUNTER — Telehealth (INDEPENDENT_AMBULATORY_CARE_PROVIDER_SITE_OTHER): Payer: Medicare Other | Admitting: Internal Medicine

## 2019-01-28 VITALS — BP 170/68 | Ht 63.0 in | Wt 147.0 lb

## 2019-01-28 DIAGNOSIS — I10 Essential (primary) hypertension: Secondary | ICD-10-CM | POA: Diagnosis not present

## 2019-01-28 DIAGNOSIS — I4819 Other persistent atrial fibrillation: Secondary | ICD-10-CM

## 2019-01-28 MED ORDER — AMLODIPINE BESYLATE 2.5 MG PO TABS: 2.5000 mg | ORAL_TABLET | Freq: Every day | ORAL | 2 refills | Status: DC

## 2019-01-28 MED ORDER — METOPROLOL TARTRATE 25 MG PO TABS: 25.0000 mg | ORAL_TABLET | Freq: Two times a day (BID) | ORAL | 2 refills | Status: DC

## 2019-01-28 NOTE — Telephone Encounter (Signed)
Called patient and discussed office visit AVS from today and she verbalized understanding of med changes and follow up for 4 months.

## 2019-01-28 NOTE — Patient Instructions (Signed)
Medication Instructions:  Your physician has recommended you make the following change in your medication:  1- START Amlodipine 2.5 mg (1 tablet) by mouth once a day.  If you need a refill on your cardiac medications before your next appointment, please call your pharmacy.   Lab work: none If you have labs (blood work) drawn today and your tests are completely normal, you will receive your results only by: Marland Kitchen MyChart Message (if you have MyChart) OR . A paper copy in the mail If you have any lab test that is abnormal or we need to change your treatment, we will call you to review the results.  Testing/Procedures: none  Follow-Up: At [facility], you and your health needs are our priority.  As part of our continuing mission to provide you with exceptional heart care, we have created designated Provider Care Teams.  These Care Teams include your primary Cardiologist (physician) and Advanced Practice Providers (APPs -  Physician Assistants and Nurse Practitioners) who all work together to provide you with the care you need, when you need it. You will need a follow up appointment in 4 months.  Please call our office 2 months in advance to schedule this appointment.  You may see DR Harrell Gave END or one of the following Advanced Practice Providers on your designated Care Team:   Murray Hodgkins, NP Christell Faith, PA-C . Marrianne Mood, PA-C

## 2019-02-05 ENCOUNTER — Telehealth: Payer: Self-pay | Admitting: Internal Medicine

## 2019-02-05 MED ORDER — METOPROLOL TARTRATE 25 MG PO TABS: 12.5000 mg | ORAL_TABLET | Freq: Two times a day (BID) | ORAL | 2 refills | Status: DC

## 2019-02-05 NOTE — Telephone Encounter (Signed)
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  I recommend decreasing metoprolol tartrate to 12.5 mg twice daily.  She should continue to monitor her blood pressure and let us know if it is consistently over 140/90 as her foot pain improves.  No other medication changes at this time.  Nelva Bush, MD [facility] Pager: [phone]

## 2019-02-05 NOTE — Telephone Encounter (Signed)
STAT if HR is under 50 or over 120 (normal HR is 60-100 beats per minute)  1) What is your heart rate? 40's   2) Do you have a log of your heart rate readings (document readings)? Speaking with granddaughter unable to find log   3) Do you have any other symptoms? Flutters interim   bp 165/67 - pain in foot

## 2019-02-05 NOTE — Telephone Encounter (Signed)
Spoke with Lars Mage and she verbalized understanding of decreasing metoprolol to 12.5 mg two times a day and to monitor BP and call us if consistently greater than 140/90. She will call the patient and let her know about his recommendations. Med list updated.

## 2019-02-05 NOTE — Telephone Encounter (Signed)
Unable to reach grand-daughter.  Called and spoke with patient. States she just moved and cannot find her BP/HR log. Thinks her BP has been running SBP 140's.  Patient denies chest pain or dizziness. States she had one episode of "flutters" and shortness of breath during cooking on Sunday but otherwise fine. She could not offer me any other information at this time. Advised I will wait for granddaughter to call back.   Grand-daughter, Lars Mage, calling back as I was getting off the phone with the patient. Listed on DPR. She states patient's HR has been in the 40's. Saturday, family member took HR of patient at home and it was 63. Yesterday, rechecked and it was 43. Her BP yesterday was 165/67.  Patient attributes elevated BP to a pain she's having in her foot from an injury.   Concerned patient's HR is running low.  Patient did start amlodipine and is taking other medications as listed on her med list including metoprolol 25 mg two times a day. Advised I will route to Dr End for further advice.

## 2019-02-12 DIAGNOSIS — L089 Local infection of the skin and subcutaneous tissue, unspecified: Secondary | ICD-10-CM | POA: Diagnosis not present

## 2019-02-12 DIAGNOSIS — L02612 Cutaneous abscess of left foot: Secondary | ICD-10-CM | POA: Diagnosis not present

## 2019-02-13 ENCOUNTER — Ambulatory Visit (INDEPENDENT_AMBULATORY_CARE_PROVIDER_SITE_OTHER): Payer: Medicare Other | Admitting: Family Medicine

## 2019-02-13 ENCOUNTER — Encounter: Payer: Self-pay | Admitting: Family Medicine

## 2019-02-13 ENCOUNTER — Other Ambulatory Visit: Payer: Self-pay

## 2019-02-13 VITALS — BP 140/84 | HR 49 | Temp 98.2°F | Ht 62.75 in | Wt 149.0 lb

## 2019-02-13 DIAGNOSIS — L089 Local infection of the skin and subcutaneous tissue, unspecified: Secondary | ICD-10-CM

## 2019-02-13 DIAGNOSIS — Z23 Encounter for immunization: Secondary | ICD-10-CM | POA: Diagnosis not present

## 2019-02-13 DIAGNOSIS — S91302A Unspecified open wound, left foot, initial encounter: Secondary | ICD-10-CM | POA: Diagnosis not present

## 2019-02-13 MED ORDER — CEFTRIAXONE SODIUM 1 G IJ SOLR
1.0000 g | Freq: Once | INTRAMUSCULAR | Status: AC
  Administered 2019-02-13: 15:00:00 1 g via INTRAMUSCULAR

## 2019-02-13 MED ORDER — AMOXICILLIN-POT CLAVULANATE 875-125 MG PO TABS: 1.0000 | ORAL_TABLET | Freq: Two times a day (BID) | ORAL | 0 refills | Status: DC

## 2019-02-13 NOTE — Patient Instructions (Signed)
Stop the Cephalexin  Start the Augmentin (new antibiotic) tomorrow morning and take twice a day  Do not take a bath or soak the foot.

## 2019-02-13 NOTE — Progress Notes (Signed)
Ninamarie Keel T. Lakayla Barrington, MD Primary Care and [place] at [facility] [place], [address] Phone: [phone]  FAX: [phone]  [name] - 78 y.o. female  MRN [id number]  Date of Birth: [birth year]  Visit Date: 02/13/2019  PCP: [name], MD  Referred by: [name], MD  Chief Complaint  Patient presents with  . Skin Lesion    Pt has spot on top of left foot that appeared when she woke up 02-11-19. It is painful, red, oozing. She went to [facility] yesterday and they put her on cephalexin 500mg  bid.   Subjective:   [name] is a 78 y.o. very pleasant female patient who presents with the following:  She is a pleasant lady she appears to have an infection in her foot, and she had some concerns that she may have some red streaks going up from her foot into her lower leg.  She is here today for evaluation.  On the dorsum of her left foot there is an elevated blister that is approximately 1-1/2 cm across.  There is some surrounding erythema.  There is some evidence of pus that is been expressed around this.  The redness is worsened compared to yesterday.  Yesterday she went to urgent care, and they placed her on Keflex 500 mg p.o. twice daily.  She does think it feels worse today compared to yesterday.  No systemic fever  Rocephin IM 1000 mg Td  Immunization History  Administered Date(s) Administered  . Influenza Whole 08/05/2009  . Influenza, High Dose Seasonal PF 07/14/2015  . Influenza,inj,Quad PF,6+ Mos 08/25/2016, 07/14/2017, 07/16/2018  . PPD Test 05/01/2012, 04/17/2013  . Pneumococcal Conjugate-13 04/17/2015  . Pneumococcal Polysaccharide-23 10/11/2007  . Td 10/11/2003, 02/13/2019     Past Medical History, Surgical History, Social History, Family History, Problem List, Medications, and Allergies have been reviewed and updated if relevant.  Patient Active Problem List   Diagnosis Date  Noted  . Persistent atrial fibrillation 07/19/2018  . Peripheral edema 02/02/2018  . Acute nonintractable headache 02/02/2018  . Coronary artery disease, non-occlusive 09/14/2017  . PAF (paroxysmal atrial fibrillation) ([place]) 09/14/2017  . Status post ablation of atrial fibrillation 09/14/2017  . Essential hypertension 09/14/2017  . Tinea pedis 07/14/2017  . 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/15/2016  . CKD (chronic kidney disease) stage 3, GFR 30-59 ml/min (HCC) 03/14/2016  . History of rheumatoid arthritis 03/14/2016  . Mitral regurgitation 12/30/2015  . Obesity 12/02/2015  . Aortic valve insufficiency 12/02/2015  . Vitamin D deficiency 04/17/2015  . Counseling regarding end of life decision making 04/17/2015  . Allergic rhinitis 02/03/2015  . Aortic valve stenosis, mild 08/19/2010  . Osteopenia 05/11/2010  . SINUS BRADYCARDIA 08/19/2009  . Anemia, iron deficiency 12/13/2007  . HYPERLIPIDEMIA 08/23/2007  . UNSPECIFIED GLAUCOMA 10/10/2006    Past Medical History:  Diagnosis Date  . Anemia   . Aortic insufficiency    a. echo 11/11: EF 55-60%, mod LVH, mild AS (mean 12), mild AI, mild MR, mild LAE  . Coronary artery disease, non-occlusive    a. cath 2/09: no CAD, EF 70%  . HYPERLIPIDEMIA   . HYPERTENSION   . OSTEOPENIA   . PAF (paroxysmal atrial fibrillation) ([place])    a. s/p ablation 2013; b. on pradaxa; c. CHADS2VASc => 4 (HTN, age x 2, female)  . Pneumonia 2009  . RA (rheumatoid arthritis) ([place])   . SINUS BRADYCARDIA  a. asymptomatic   . Unspecified glaucoma(365.9)     Past Surgical History:  Procedure Laterality Date  . ABLATION OF DYSRHYTHMIC FOCUS    . CARDIAC CATHETERIZATION    . COLONOSCOPY  04/18/08  . Partial hysterectomy--1979    . Thoracentesis   12/20/07      Social History   Socioeconomic History  . Marital status: Widowed    Spouse name: Not on file  . Number of children: Not on file  . Years of education: Not on file  . Highest education  level: Not on file  Occupational History  . Not on file  Social Needs  . Financial resource strain: Not on file  . Food insecurity:    Worry: Not on file    Inability: Not on file  . Transportation needs:    Medical: Not on file    Non-medical: Not on file  Tobacco Use  . Smoking status: Never Smoker  . Smokeless tobacco: Never Used  Substance and Sexual Activity  . Alcohol use: No    Alcohol/week: 0.0 standard drinks  . Drug use: No  . Sexual activity: Never  Lifestyle  . Physical activity:    Days per week: Not on file    Minutes per session: Not on file  . Stress: Not on file  Relationships  . Social connections:    Talks on phone: Not on file    Gets together: Not on file    Attends religious service: Not on file    Active member of club or organization: Not on file    Attends meetings of clubs or organizations: Not on file    Relationship status: Not on file  . Intimate partner violence:    Fear of current or ex partner: Not on file    Emotionally abused: Not on file    Physically abused: Not on file    Forced sexual activity: Not on file  Other Topics Concern  . 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 Full Code ( reviewed 2015)       Occasional exercise.   Diet: fruits and veggies, lean meats.    Family History  Problem Relation Age of Onset  . Diabetes Unknown   . Breast cancer Paternal Aunt 67  . Heart disease Mother   . Pancreatic cancer Father   . Atrial fibrillation Brother     Allergies  Allergen Reactions  . Celecoxib Other (See Comments)     Tachycardia/palpitations  . Tramadol Nausea Only    Medication list reviewed and updated in full in [facility].   GEN: No acute illnesses, no fevers, chills. GI: No n/v/d, eating normally Pulm: No SOB Interactive and getting along well at home.  Otherwise, ROS is as per the HPI.  Objective:   BP 140/84 (BP Location: Left Arm, Patient Position: Sitting, Cuff Size: Normal)   Pulse (!) 49   Temp 98.2 F (36.8 C) (Oral)   Ht 5' 2.75" (1.594 m)   Wt 149 lb (67.6 kg)   SpO2 98%   BMI 26.60 kg/m   GEN: WDWN, NAD, Non-toxic, A & O  x 3 HEENT: Atraumatic, Normocephalic. Neck supple. No masses, No LAD. Ears and Nose: No external deformity.Marland Kitchen EXTR: No c/c/e NEURO Normal gait.  PSYCH: Normally interactive. Conversant. Not depressed or anxious appearing.  Calm demeanor.   This image below is of the patient's left foot.  There is some fluctuance centrally as well as surrounding erythema per the picture below. Moderately tender to touch, but better after expression of liquid.      Laboratory and Imaging Data:  Assessment and Plan:   Left foot infection - Plan: WOUND CULTURE, cefTRIAXone (ROCEPHIN) injection 1 g  Open wound of left foot, initial encounter - Plan: cefTRIAXone (ROCEPHIN) injection 1 g, Td : Tetanus/diphtheria >7yo Preservative  free  Worsening left foot infection as seen above.  I am getting give the patient Rocephin 1 g today in the office to acutely broaden antibiotic coverage and cover anaerobic organisms.  To provide additional oral antibiotic coverage and anaerobic coverage, I am going to place the patient on Augmentin p.o. twice daily for 10 days.  Follow-up on Friday with her primary care provider.  She understands that if this worsens significantly, she develops fever, and the redness progresses up her leg that she needs to have an urgent evaluation and likely hospital admission.  Follow-up: Return in about 2 days (around 02/15/2019) for Wound recheck  with Dr. Diona Browner on Friday.  Meds ordered this encounter  Medications  . amoxicillin-clavulanate (AUGMENTIN) 875-125 MG tablet    Sig: Take 1 tablet by mouth 2 (two) times daily for 10 days.    Dispense:  20 tablet    Refill:  0  . cefTRIAXone (ROCEPHIN) injection 1 g   Orders Placed This Encounter  Procedures  . WOUND CULTURE  . Td : Tetanus/diphtheria >7yo Preservative  free    Signed,  Bilbo Carcamo T. Melyna Huron, MD   Outpatient Encounter Medications as of 02/13/2019  Medication Sig  . amLODipine (NORVASC) 2.5 MG tablet Take 1 tablet (2.5 mg total) by mouth daily.  Marland Kitchen apixaban (ELIQUIS) 5 MG TABS tablet Take 1 tablet (5 mg total) by mouth 2 (two) times daily.  . B Complex Vitamins (VITAMIN B COMPLEX) TABS Take 1 tablet by mouth daily.   . Calcium Carb-Cholecalciferol (CALCIUM 1000 + D) 1000-800 MG-UNIT TABS Take 1 tablet by mouth daily.  . cephALEXin (KEFLEX) 500 MG capsule Take 1 capsule by mouth 2 (two) times daily.  . clotrimazole (LOTRIMIN) 1 % cream Apply 1 application topically 2 (two) times daily.  . metoprolol tartrate (LOPRESSOR) 25 MG tablet Take 0.5 tablets (12.5 mg total) by mouth 2 (two) times daily.  Marland Kitchen spironolactone (ALDACTONE) 25 MG tablet Take 1 tablet (25 mg total) by mouth daily.  Marland Kitchen amoxicillin-clavulanate (AUGMENTIN) 875-125 MG tablet Take 1 tablet by mouth 2 (two) times daily for 10 days.  . [EXPIRED] cefTRIAXone (ROCEPHIN) injection 1 g    No facility-administered encounter medications on file as of 02/13/2019.

## 2019-02-15 ENCOUNTER — Encounter: Payer: Self-pay | Admitting: Family Medicine

## 2019-02-15 ENCOUNTER — Other Ambulatory Visit: Payer: Self-pay

## 2019-02-15 ENCOUNTER — Ambulatory Visit (INDEPENDENT_AMBULATORY_CARE_PROVIDER_SITE_OTHER): Payer: Medicare Other | Admitting: Family Medicine

## 2019-02-15 VITALS — BP 180/80 | HR 57 | Temp 97.9°F | Ht 62.75 in | Wt 150.5 lb

## 2019-02-15 DIAGNOSIS — R21 Rash and other nonspecific skin eruption: Secondary | ICD-10-CM | POA: Diagnosis not present

## 2019-02-15 MED ORDER — DOXYCYCLINE HYCLATE 100 MG PO TABS: 100.0000 mg | ORAL_TABLET | Freq: Two times a day (BID) | ORAL | 0 refills | Status: DC

## 2019-02-15 NOTE — Progress Notes (Signed)
Subjective:    Patient ID: [name], female    DOB: 1941/03/01, 78 y.o.   MRN: [id number]  HPI  78 year old female presents for follow up left food wound Red streaking and blister noted in last week. Initially itchy red bump.  Seen at urgent care.. started on keflex.. continued to worsen, 2 days alter Saw Dr. Lorelei Pont in office on 02/13/2019  Given rocephin 1 g x 1 in office. As well as TD.  Started on Augmentin.  Wound culture obtained: Negative.   She feel the area  is worse compared to 2 days ago.  Area is not painful or itchy overall.   No fever, no flu like symptoms.  Hx of Afib and HTN  Today BP elevated. On amlodipine, metoprolol and spironolactone. BP Readings from Last 3 Encounters:  02/15/19 (!) 180/80  02/13/19 140/84  01/28/19 (!) 170/68    No known past MRSA, no recurrent boils. No chronic skin issues.  Social History /Family History/Past Medical History reviewed in detail and updated in EMR if needed. Blood pressure (!) 180/80, pulse (!) 57, temperature 97.9 F (36.6 C), temperature source Oral, height 5' 2.75" (1.594 m), weight 150 lb 8 oz (68.3 kg).  Review of Systems  Constitutional: Negative for fatigue and fever.  HENT: Negative for ear pain.   Eyes: Negative for pain.  Respiratory: Negative for chest tightness and shortness of breath.   Cardiovascular: Negative for chest pain, palpitations and leg swelling.  Gastrointestinal: Negative for abdominal pain.  Genitourinary: Negative for dysuria.       Objective:   Physical Exam Constitutional:      General: She is not in acute distress.    Appearance: Normal appearance. She is well-developed. She is not ill-appearing or toxic-appearing.  HENT:     Head: Normocephalic.     Right Ear: Hearing, tympanic membrane, ear canal and external ear normal. Tympanic membrane is not erythematous, retracted or bulging.     Left Ear: Hearing, tympanic membrane, ear canal and external ear normal. Tympanic  membrane is not erythematous, retracted or bulging.     Nose: No mucosal edema or rhinorrhea.     Right Sinus: No maxillary sinus tenderness or frontal sinus tenderness.     Left Sinus: No maxillary sinus tenderness or frontal sinus tenderness.     Mouth/Throat:     Pharynx: Uvula midline.  Eyes:     General: Lids are normal. Lids are everted, no foreign bodies appreciated.     Conjunctiva/sclera: Conjunctivae normal.     Pupils: Pupils are equal, round, and reactive to light.  Neck:     Musculoskeletal: Normal range of motion and neck supple.     Thyroid: No thyroid mass or thyromegaly.     Vascular: No carotid bruit.     Trachea: Trachea normal.  Cardiovascular:     Rate and Rhythm: Normal rate and regular rhythm.     Pulses: Normal pulses.     Heart sounds: Normal heart sounds, S1 normal and S2 normal. No murmur. No friction rub. No gallop.   Pulmonary:     Effort: Pulmonary effort is normal. No tachypnea or respiratory distress.     Breath sounds: Normal breath sounds. No decreased breath sounds, wheezing, rhonchi or rales.  Abdominal:     General: Bowel sounds are normal.     Palpations: Abdomen is soft.     Tenderness: There is no abdominal tenderness.  Skin:    General: Skin is warm and  dry.     Findings: No rash.     Comments: Tense bullae on posterior foot with , minimal warmth, small amount of erythema above or dorsal foot.. uncharged for better or worse compared to 02/13/2019 picture.  Neurological:     Mental Status: She is alert.  Psychiatric:        Mood and Affect: Mood is not anxious or depressed.        Speech: Speech normal.        Behavior: Behavior normal. Behavior is cooperative.        Thought Content: Thought content normal.        Judgment: Judgment normal.           Assessment & Plan:

## 2019-02-15 NOTE — Assessment & Plan Note (Signed)
Minimal improvement with keflex, 1 g ceftriaxone as well as Augmentin.  Will broaden antibiotics to doxy to cover for MRSA. ? If was insect bite versus other blistering rash in elderly... no current mucus involvement or other rash.. very localized. No red flags for stevens johnson, pemphigoid etc.  Will refer to Derm for further eval as not responding as expected.

## 2019-02-15 NOTE — Patient Instructions (Signed)
Stop Augmentin and change to doxycycline to cover possible MRSA infection.  Rosaria Ferries will call you early on Monday AM for dermatology appointment to be set up. GO to ER if redness spreading or fever over weekend or not tolerating antibiotics.

## 2019-02-16 LAB — WOUND CULTURE
MICRO NUMBER:: 450454
SPECIMEN QUALITY:: ADEQUATE

## 2019-02-18 DIAGNOSIS — S90862S Insect bite (nonvenomous), left foot, sequela: Secondary | ICD-10-CM | POA: Diagnosis not present

## 2019-03-01 ENCOUNTER — Telehealth: Payer: Self-pay

## 2019-03-01 MED ORDER — FLUCONAZOLE 150 MG PO TABS: 150.0000 mg | ORAL_TABLET | Freq: Once | ORAL | 0 refills | Status: AC

## 2019-03-01 NOTE — Telephone Encounter (Signed)
My have yeast infection... will treat with fluconazole x 1 dose.

## 2019-03-01 NOTE — Telephone Encounter (Signed)
Spoke with patient. She is aware and has picked up medication.

## 2019-03-01 NOTE — Telephone Encounter (Signed)
Pt finished 3rd abx on 02/26/19; on 02/26/19 started with perineal itching; no vaginal discharge or vaginal itching. Pt has used vaseline but no OTC meds. [facility]

## 2019-03-01 NOTE — Telephone Encounter (Signed)
Tried to reach patient X 2 this pm to inform of r/x.  No answer and no answering machine to leave a message.

## 2019-03-29 ENCOUNTER — Telehealth: Payer: Self-pay | Admitting: Internal Medicine

## 2019-03-29 NOTE — Telephone Encounter (Signed)
Spoke with grandaughter who is a Marine scientist at our sister office. The symptoms and details in the first entry are accurate. Only to add that patient has more frequent palpitations along with shortness of breath which has gradually increased over the past weeks and months. Patient said it is similar feeling that she had prior to her ablation. Lars Mage does not know how long it lasts but patient said it doesn't last long.  Routing to Dr End to review.

## 2019-03-29 NOTE — Telephone Encounter (Signed)
Pt c/o swelling: STAT is pt has developed SOB within 24 hours  1) How much weight have you gained and in what time span? No weight gain  If swelling, where is the swelling located? Bilateral feet and ankles, right more swollen than the left  2) Are you currently taking a fluid pill? Not sure  3) Are you currently SOB? Only when she has palpitations and sometimes with exertion  4) Do you have a log of your daily weights (if so, list)? No wight gain  5) Have you gained 3 pounds in a day or 5 pounds in a week? no  6) Have you traveled recently? No  Swelling has started when Amlodipine was started.   Patient also has afib and states she has felt more palpitaions more. Patient c/o Palpitations:  High priority if patient c/o lightheadedness, shortness of breath, or chest pain  1) How long have you had palpitations/irregular HR/ Afib? Are you having the symptoms now? 3 weeks , no, most recent was yesterday  2) Are you currently experiencing lightheadedness, SOB or CP? no  3) Do you have a history of afib (atrial fibrillation) or irregular heart rhythm? H/o afib  4) Have you checked your BP or HR? (document readings if available):  BP readings: 6/15-128/69 HR 46 6/16-132/69 HR 59 6/17-146/60 HR 63 6/18- 122/54  Later yesterday 152/71     5) Are you experiencing any other symptoms? no

## 2019-03-29 NOTE — Telephone Encounter (Signed)
If swelling began with addition of amlodipine, please have her stop taking amlodipine and monitor her blood pressure.  We should make arrangements for her to be seen in the office by me or an APP in the next 1-2 weeks.  Nelva Bush, MD [facility] Pager: [phone]

## 2019-04-01 ENCOUNTER — Telehealth: Payer: Self-pay | Admitting: Internal Medicine

## 2019-04-01 NOTE — Telephone Encounter (Signed)
This is in response to previous telephone encounter. Patient was supposed to call back and schedule an appointment which is what she has done.

## 2019-04-01 NOTE — Telephone Encounter (Signed)
Spoke with granddaughter.  She verbalized understanding to have patient stop amlodipine, monitor BP and schedule appointment. She will call patient to give her the instructions and have patient call us back to schedule an appointment.  I will also route to scheduling to reach out later if patient has not called back.

## 2019-04-01 NOTE — Telephone Encounter (Signed)
Scheduled 6/26 with Sharolyn Douglas as family wanted her seen sooner  See also note to triage on swelling

## 2019-04-01 NOTE — Telephone Encounter (Signed)
Pt c/o swelling: STAT is pt has developed SOB within 24 hours  1) How much weight have you gained and in what time span? Unknown   2) If swelling, where is the swelling located?  BLE   3) Are you currently taking a fluid pill? Unknown   4) Are you currently SOB?  Yes a few weeks ago   5) Do you have a log of your daily weights (if so, list)? No   6) Have you gained 3 pounds in a day or 5 pounds in a week?  Unknown   7) Have you traveled recently?  No   Scheduled 6/26 berge 3 pm

## 2019-04-04 ENCOUNTER — Telehealth: Payer: Self-pay | Admitting: Internal Medicine

## 2019-04-04 NOTE — Telephone Encounter (Signed)

## 2019-04-05 ENCOUNTER — Ambulatory Visit: Payer: Medicare Other | Admitting: Nurse Practitioner

## 2019-04-05 ENCOUNTER — Other Ambulatory Visit: Payer: Self-pay

## 2019-04-05 ENCOUNTER — Ambulatory Visit (INDEPENDENT_AMBULATORY_CARE_PROVIDER_SITE_OTHER): Payer: Medicare Other

## 2019-04-05 ENCOUNTER — Encounter: Payer: Self-pay | Admitting: Nurse Practitioner

## 2019-04-05 VITALS — BP 120/82 | HR 61 | Temp 97.8°F | Ht 63.0 in | Wt 151.2 lb

## 2019-04-05 DIAGNOSIS — I480 Paroxysmal atrial fibrillation: Secondary | ICD-10-CM

## 2019-04-05 DIAGNOSIS — I10 Essential (primary) hypertension: Secondary | ICD-10-CM | POA: Diagnosis not present

## 2019-04-05 DIAGNOSIS — R002 Palpitations: Secondary | ICD-10-CM | POA: Diagnosis not present

## 2019-04-05 DIAGNOSIS — R0600 Dyspnea, unspecified: Secondary | ICD-10-CM | POA: Diagnosis not present

## 2019-04-05 NOTE — Progress Notes (Signed)
Office Visit    Patient Name: [name] Date of Encounter: 04/05/2019  Primary Care Provider:  [name], MD Primary Cardiologist:  Jacqueline Bush, MD  Chief Complaint    78 year old female with a history of nonobstructive CAD, paroxysmal atrial fibrillation status post catheter ablation in 2013, asymptomatic bradycardia, mitral and aortic regurgitation, stage II chronic kidney disease, hypertension, and hyperlipidemia, who presents for follow-up related to palpitations, dyspnea, and edema.  Past Medical History    Past Medical History:  Diagnosis Date   Anemia    Aortic insufficiency    a. echo 11/11: EF 55-60%, mod LVH, mild AS (mean 12), mild AI, mild MR, mild LAE   Coronary artery disease, non-occlusive    a. cath 2/09: no CAD, EF 70%   HYPERLIPIDEMIA    HYPERTENSION    OSTEOPENIA    PAF (paroxysmal atrial fibrillation) ([name])    a. s/p ablation 2013; b. on pradaxa; c. CHADS2VASc => 4 (HTN, age x 2, female)   Pneumonia 2009   RA (rheumatoid arthritis) ([name])    SINUS BRADYCARDIA    a. asymptomatic    Unspecified glaucoma(365.9)    Past Surgical History:  Procedure Laterality Date   ABLATION OF DYSRHYTHMIC FOCUS     CARDIAC CATHETERIZATION     COLONOSCOPY  04/18/08   Partial hysterectomy--1979     Thoracentesis   12/20/07      Allergies  Allergies  Allergen Reactions   Celecoxib Other (See Comments)    Tachycardia/palpitations   Tramadol Nausea Only    History of Present Illness    78 year old female with the above past medical history including nonobstructive CAD, paroxysmal atrial fibrillation status post catheter ablation, asymptomatic bradycardia, mitral and aortic regurgitation, CKD stage III, hypertension, and hyperlipidemia.  She was last seen in clinic in April of this year.  Just prior to that visit, she was placed on metoprolol therapy after her pharmacy was no longer able to procure pindolol.  In the setting of  elevated blood pressures, amlodipine 2.5 mg daily was also added.  Metoprolol was later reduced to 12.5 mg twice daily in the setting of baseline bradycardia with rates in the 40s.  Shortly after starting amlodipine, she began to experience lower extremity swelling.  She had a bite on the dorsal surface of her foot which required several rounds of antibiotics by primary care.  When she contacted our office earlier this month to report lower extremity swelling that started after amlodipine was started, amlodipine was discontinued.  Swelling has since resolved.  Today, she reports that over the past few weeks, she has been experiencing intermittent palpitations/fluttering and also dyspnea.  She thinks the dyspnea has improved over the past few weeks, especially after stopping amlodipine, but she has not yet returned to baseline.  She thinks she can probably walk at least 200 yards prior to experiencing dyspnea.  She does not experience chest pain.  As for her palpitations, these can occur at any time, last up to an hour, and resolve spontaneously.  She is not really sure how frequently they occur and does not think she has associated symptoms.  She denies PND, orthopnea, dizziness, syncope, or early satiety.  Of note, she tells [place] today that she is no longer taking metoprolol.  In review of notes between her last visit and this 1, I do not see where she was advised to discontinue it but she is pretty certain that someone told her to stop it.  I  spoke with her granddaughter, who is a Marine scientist with 1 of our [facility] offices and has been staying on top of [name] medicines and vital signs, and she indicates that her understanding was that she should remain on 12.5 mg of metoprolol and she is not sure why her grandmother might of stopped it.  She thinks that she is probably confused with regards to what she was supposed to have done.  Home Medications    Prior to Admission medications   Medication Sig Start  Date End Date Taking? Authorizing Provider  amLODipine (NORVASC) 2.5 MG tablet Take 2.5 mg by mouth daily.   Yes [provider]  apixaban (ELIQUIS) 5 MG TABS tablet Take 1 tablet (5 mg total) by mouth 2 (two) times daily. 10/31/18  Yes Jacqueline Merritts, MD  B Complex Vitamins (VITAMIN B COMPLEX) TABS Take 1 tablet by mouth daily.    Yes [provider]  Calcium Carb-Cholecalciferol (CALCIUM 1000 + D) 1000-800 MG-UNIT TABS Take 1 tablet by mouth daily.   Yes [provider]  clotrimazole (LOTRIMIN) 1 % cream Apply 1 application topically 2 (two) times daily. 07/17/18  Yes [name], Jacqueline E, MD  spironolactone (ALDACTONE) 25 MG tablet Take 1 tablet (25 mg total) by mouth daily. 01/08/19  Yes Jacqueline Merritts, MD    Review of Systems    Palpitations and dyspnea as outlined above.  She did have swelling her amlodipine and this is been discontinued.  She denies chest pain, pnd, orthopnea, n, v, dizziness, syncope, weight gain, or early satiety.  All other systems reviewed and are otherwise negative except as noted above.  Physical Exam    VS:  BP 120/82 (BP Location: Left Arm, Patient Position: Sitting, Cuff Size: Normal)    Pulse 61    Temp 97.8 F (36.6 C)    Ht 5\' 3"  (1.6 m)    Wt 151 lb 4 oz (68.6 kg)    SpO2 98%    BMI 26.79 kg/m  , BMI Body mass index is 26.79 kg/m. GEN: Well nourished, well developed, in no acute distress. HEENT: normal. Neck: Supple, no JVD, carotid bruits, or masses. Cardiac: YKZ,9/9 systolic murmur noted throughout, no rubs, or gallops. No clubbing, cyanosis, edema.  Radials/DP/PT 2+ and equal bilaterally.  Respiratory:  Respirations regular and unlabored, clear to auscultation bilaterally. GI: Soft, nontender, nondistended, BS + x 4. MS: no deformity or atrophy. Skin: warm and dry, no rash. Neuro:  Strength and sensation are intact. Psych: Normal affect.  Accessory Clinical Findings    ECG personally reviewed by me today -sinus rhythm,  61, short PR interval, nonspecific T changes- no acute changes.  Assessment & Plan    1.  Paroxysmal atrial fibrillation/palpitations: The past few weeks to month, patient has been experiencing intermittent palpitations.  She has a prior history of atrial fibrillation and is status post catheter ablation in 2013.  He had previously been taking pindolol however this was switched to metoprolol due to an inability to procure pindolol.  Toprol dose was subsequently reduced to 12.5 mg twice daily in the setting of sinus bradycardia with rates in the 40s.  Patient says she was later told to discontinue metoprolol though we do not see any evidence of this and her granddaughter, who helps take care of her medicines, says that she was unaware that this might have occurred and thinks that her grandmother might just be confused about it.  In that setting, I have asked her granddaughter to  resume metoprolol 12.5 mg twice daily and continue to track her heart rates and blood pressures at home.  Patient will remain on Eliquis.  In light of more frequent palpitations, which may be explained by reduced and then discontinued beta-blocker, a ZIO monitor has been placed today.  I will also follow-up a CBC, basic metabolic panel, and magnesium.  2.  Dyspnea on exertion: Patient and family have noted an increase in dyspnea on exertion over the past month or so.  Though she did have some swelling while on amlodipine, that has since subsided with discontinuation of amlodipine.  Dyspnea might have improved some, the patient says she is not really sure.  She is euvolemic on examination.  She has not had any chest pain.  Given symptoms and valvular heart disease, I will arrange for a follow-up echocardiogram.  3.  Essential hypertension: Blood pressure stable today at 120/82.  Patient has been off of both metoprolol and amlodipine.  Resuming low-dose metoprolol as above.  4.  Valvular heart disease: Mild aortic insufficiency and  stenosis and mild mitral regurgitation noted on prior echo in December 2018.  Following up echo in the setting of dyspnea.  5.  Disposition: Follow-up labs, echo, and monitoring.  Follow-up in clinic in approximately 6 weeks.  Granddaughter would like to be present for the next visit and kept abreast of all testing results.  Her phone number is [phone] and her name is Isac Caddy.   Murray Hodgkins, NP 04/05/2019, 4:40 PM

## 2019-04-05 NOTE — Patient Instructions (Signed)
Medication Instructions:  Your physician recommends that you continue on your current medications as directed. Please refer to the Current Medication list given to you today.  We removed amlodipine and metoprolol from your list since you aren't taking it.  If you need a refill on your cardiac medications before your next appointment, please call your pharmacy.   Lab work: Your physician recommends that you have lab work today(CBC, BMET, [place])  If you have labs (blood work) drawn today and your tests are completely normal, you will receive your results only by: Marland Kitchen MyChart Message (if you have MyChart) OR . A paper copy in the mail If you have any lab test that is abnormal or we need to change your treatment, we will call you to review the results.  Testing/Procedures: 1- A zio monitor was placed today. It will remain on for 14 days. You will then return monitor and event diary in provided box. It takes 1-2 weeks for report to be downloaded and returned to [place]. We will call you with the results. If monitor falls of or has orange flashing light, please call Zio for further instructions.   2- Echo  Please return to [facility] on ______________ at _______________ AM/PM for an Echocardiogram. Your physician has requested that you have an echocardiogram. Echocardiography is a painless test that uses sound waves to create images of your heart. It provides your doctor with information about the size and shape of your heart and how well your heart's chambers and valves are working. This procedure takes approximately one hour. There are no restrictions for this procedure. Please note; depending on visual quality an IV may need to be placed.    Follow-Up: At [facility], you and your health needs are our priority.  As part of our continuing mission to provide you with exceptional heart care, we have created designated Provider Care Teams.  These Care Teams include your primary Cardiologist  (physician) and Advanced Practice Providers (APPs -  Physician Assistants and Nurse Practitioners) who all work together to provide you with the care you need, when you need it. You will need a follow up appointment in 6-8 weeks.  You may see [name] or Murray Hodgkins, NP.

## 2019-04-06 LAB — CBC
Hematocrit: 34.8 % (ref 34.0–46.6)
Hemoglobin: 11.4 g/dL (ref 11.1–15.9)
MCH: 30.3 pg (ref 26.6–33.0)
MCHC: 32.8 g/dL (ref 31.5–35.7)
MCV: 93 fL (ref 79–97)
Platelets: 206 10*3/uL (ref 150–450)
RBC: 3.76 x10E6/uL — ABNORMAL LOW (ref 3.77–5.28)
RDW: 13 % (ref 11.7–15.4)
WBC: 5 10*3/uL (ref 3.4–10.8)

## 2019-04-06 LAB — BASIC METABOLIC PANEL
BUN/Creatinine Ratio: 19 (ref 12–28)
BUN: 26 mg/dL (ref 8–27)
CO2: 23 mmol/L (ref 20–29)
Calcium: 9.9 mg/dL (ref 8.7–10.3)
Chloride: 105 mmol/L (ref 96–106)
Creatinine, Ser: 1.39 mg/dL — ABNORMAL HIGH (ref 0.57–1.00)
GFR calc Af Amer: 42 mL/min/{1.73_m2} — ABNORMAL LOW (ref >59)
GFR calc non Af Amer: 37 mL/min/{1.73_m2} — ABNORMAL LOW (ref >59)
Glucose: 97 mg/dL (ref 65–99)
Potassium: 5 mmol/L (ref 3.5–5.2)
Sodium: 140 mmol/L (ref 134–144)

## 2019-04-06 LAB — MAGNESIUM: Magnesium: 2.2 mg/dL (ref 1.6–2.3)

## 2019-04-08 ENCOUNTER — Other Ambulatory Visit: Payer: Self-pay

## 2019-04-26 DIAGNOSIS — R002 Palpitations: Secondary | ICD-10-CM | POA: Diagnosis not present

## 2019-05-03 ENCOUNTER — Telehealth: Payer: Self-pay | Admitting: *Deleted

## 2019-05-03 MED ORDER — METOPROLOL TARTRATE 25 MG PO TABS: 12.5000 mg | ORAL_TABLET | Freq: Two times a day (BID) | ORAL | 3 refills | Status: DC

## 2019-05-03 NOTE — Telephone Encounter (Signed)
I called and spoke with the patient's grand daughter, [name] (ok per DPR) and advised her of [name], NPs recommendations to resume metoprolol tartrate 25 mg- 1/2 tablet (12.5 mg) by mouth twice daily.  She would like this refilled to [facility].

## 2019-05-03 NOTE — Telephone Encounter (Signed)
-----   Message from Theora Gianotti, NP sent at 05/03/2019  7:39 AM EDT ----- No afib on monitor.  She does have extra heart beats coming from the top part of the heart.  These have lasted up to 20 beats and can be quite fast - 169bpm.  Cont low dose metoprolol as discussed in clinic. F/u as planned w/ [name] on 8/5.

## 2019-05-03 NOTE — Telephone Encounter (Signed)
Given findings of SVT (rapid beats from top chambers of the heart), it's even more reason to resume  blocker.

## 2019-05-03 NOTE — Telephone Encounter (Signed)
Results called to patient's grand-daughter [name]), ok per DPR. Per grand-daughter [name]), patient never resumed the metoprolol because it was not on patient's AVS and though she had told the patient [name], NP advised to do so.  Patient was not taking metoprolol while wearing the monitor. Advised I would make sure with Gerald Stabs that plan should still include resuming metoprolol 12.5 mg twice a day.

## 2019-05-14 ENCOUNTER — Ambulatory Visit (INDEPENDENT_AMBULATORY_CARE_PROVIDER_SITE_OTHER): Payer: Medicare Other

## 2019-05-14 ENCOUNTER — Other Ambulatory Visit: Payer: Self-pay

## 2019-05-14 DIAGNOSIS — R0600 Dyspnea, unspecified: Secondary | ICD-10-CM | POA: Diagnosis not present

## 2019-05-15 ENCOUNTER — Ambulatory Visit (INDEPENDENT_AMBULATORY_CARE_PROVIDER_SITE_OTHER): Payer: Medicare Other | Admitting: Internal Medicine

## 2019-05-15 ENCOUNTER — Encounter: Payer: Self-pay | Admitting: Internal Medicine

## 2019-05-15 ENCOUNTER — Telehealth: Payer: Self-pay | Admitting: Internal Medicine

## 2019-05-15 VITALS — BP 158/88 | HR 43 | Ht 60.0 in | Wt 157.5 lb

## 2019-05-15 DIAGNOSIS — I471 Supraventricular tachycardia: Secondary | ICD-10-CM

## 2019-05-15 DIAGNOSIS — I38 Endocarditis, valve unspecified: Secondary | ICD-10-CM | POA: Diagnosis not present

## 2019-05-15 DIAGNOSIS — I10 Essential (primary) hypertension: Secondary | ICD-10-CM | POA: Diagnosis not present

## 2019-05-15 DIAGNOSIS — I480 Paroxysmal atrial fibrillation: Secondary | ICD-10-CM

## 2019-05-15 DIAGNOSIS — Z79899 Other long term (current) drug therapy: Secondary | ICD-10-CM | POA: Diagnosis not present

## 2019-05-15 MED ORDER — HYDROCHLOROTHIAZIDE 12.5 MG PO CAPS: 12.5000 mg | ORAL_CAPSULE | Freq: Every day | ORAL | 3 refills | Status: DC

## 2019-05-15 NOTE — Telephone Encounter (Signed)
The patient is scheduled to see [name] today at 2:40 pm. I have spoken with her and asked her the COVID-19 screening questions as below.  She is aware she will need to come in to [place] in order to get to our office.        COVID-19 Pre-Screening Questions:  . In the past 7 to 10 days have you had a cough,  shortness of breath, headache, congestion, fever (100 or greater) body aches, chills, sore throat, or sudden loss of taste or sense of smell? No  . Have you been around anyone with known Covid 19. No  . Have you been around anyone who is awaiting Covid 19 test results in the past 7 to 10 days? No  . Have you been around anyone who has been exposed to Covid 19, or has mentioned symptoms of Covid 19 within the past 7 to 10 days? No   If you have any concerns/questions about symptoms patients report during screening (either on the phone or at threshold). Contact the provider seeing the patient or DOD for further guidance.  If neither are available contact a member of the leadership team.

## 2019-05-15 NOTE — Progress Notes (Signed)
Follow-up Outpatient Visit Date: 05/15/2019  Primary Care Provider: [name], MD [place] [address]  Chief Complaint: Leg swelling  HPI:  [name] is a 78 y.o. year-old female with history of nonobstructive CAD, paroxysmal atrial fibrillation status post ablation, asymptomatic bradycardia, mitral and aortic regurgitation, chronic kidney disease stage II, hypertension, and hyperlipidemia, who presents for follow-up of palpitations, hypertension, and atrial fibrillation.  She was last seen in our office in late June by [name], NP for follow-up of palpitations, dyspnea, and edema.  She had been started on amlodipine earlier this year due to elevated blood pressures with resultant edema of her distal legs and feet.  Swelling resolved with discontinuation of amlodipine.  However, she endorsed occasional palpitations/fluttering and shortness of breath at that last visit, the latter of which improved with discontinuation of amlodipine.  [name] had inadvertently stopped low-dose metoprolol prior to her last visit; she was advised to restart metoprolol tartrate 12.5 mg twice daily and she was also placed showed predominantly sinus rhythm with occasional PACs and rare PVCs as well as multiple episodes of PSVT lasting up to 10.2 seconds.  Today, [name] reports that she has had some leg swelling over the last few months.  It turns out that she has not been on spironolactone for at least a month or 2, as it was not refilled by her pharmacy.  She has stable exertional dyspnea that has been present for months to years.  She continues have occasional palpitations that she describes as a "butterfly in the chest."  Episodes have been about once a week, with symptoms coming and going over the course of 30 to 60 minutes.  Overall, the frequency and intensity has been about the same, not clearly improved since restarting metoprolol.  [name] denies lightheadedness,  orthopnea, and PND.  She has gained about 5 pounds in the last 1 to 2 months, which she attributes to less activity.  Her energy has been pretty good.  [name] notes some labile blood pressures, often around 130/60, though morning pressures before taking her metoprolol is frequently higher.  She has had some borderline low readings later in the day as well.  --------------------------------------------------------------------------------------------------  Past Medical History:  Diagnosis Date   Anemia    Aortic insufficiency    a. echo 11/11: EF 55-60%, mod LVH, mild AS (mean 12), mild AI, mild MR, mild LAE; b. 09/2017 Echo: EF 60-65%, no rwma, mild AS/AI, mod MR. mildly dil LA. PASP 52mmHg.   Coronary artery disease, non-occlusive    a. cath 2/09: no CAD, EF 70%   HYPERLIPIDEMIA    HYPERTENSION    OSTEOPENIA    PAF (paroxysmal atrial fibrillation) ([place])    a. s/p ablation 2013; b. CHADS2VASc -> 4 (HTN, age x 2, female)-->Eliquis.   Pneumonia 2009   RA (rheumatoid arthritis) ([facility])    SINUS BRADYCARDIA    a. asymptomatic    Unspecified glaucoma(365.9)    Past Surgical History:  Procedure Laterality Date   ABLATION OF DYSRHYTHMIC FOCUS     CARDIAC CATHETERIZATION     COLONOSCOPY  04/18/08   Partial hysterectomy--1979     Thoracentesis   12/20/07      Current Meds  Medication Sig   apixaban (ELIQUIS) 5 MG TABS tablet Take 1 tablet (5 mg total) by mouth 2 (two) times daily.   B Complex Vitamins (VITAMIN B COMPLEX) TABS Take 1 tablet by mouth daily.    Calcium  Carb-Cholecalciferol (CALCIUM 1000 + D) 1000-800 MG-UNIT TABS Take 1 tablet by mouth daily.   clotrimazole (LOTRIMIN) 1 % cream Apply 1 application topically 2 (two) times daily.   metoprolol tartrate (LOPRESSOR) 25 MG tablet Take 0.5 tablets (12.5 mg total) by mouth 2 (two) times daily.    Allergies: Amlodipine, Celecoxib, and Tramadol  Social History   Tobacco Use   Smoking status: Never  Smoker   Smokeless tobacco: Never Used  Substance Use Topics   Alcohol use: No    Alcohol/week: 0.0 standard drinks   Drug use: No    Family History  Problem Relation Age of Onset   Diabetes Other    Breast cancer Paternal Aunt 75   Heart disease Mother    Pancreatic cancer Father    Atrial fibrillation Brother     Review of Systems: A 12-system review of systems was performed and was negative except as noted in the HPI.  --------------------------------------------------------------------------------------------------  Physical Exam: BP (!) 158/88 (BP Location: Left Arm, Patient Position: Sitting, Cuff Size: Normal)    Pulse (!) 43    Ht 5' (1.524 m)    Wt 157 lb 8 oz (71.4 kg)    BMI 30.76 kg/m    Ambulating heart rate: 43->71 bpm.  General: NAD.  Accompanied by her granddaughter. HEENT: No conjunctival pallor or scleral icterus.  Facemask in place. Neck: Supple without lymphadenopathy, thyromegaly, JVD, or HJR. Lungs: Normal work of breathing. Clear to auscultation bilaterally without wheezes or crackles. Heart: Bradycardic but regular with 2/6 systolic murmur. Non-displaced PMI. Abd: Bowel sounds present. Soft, NT/ND without hepatosplenomegaly Ext: Trace ankle edema bilaterally. Radial, PT, and DP pulses are 2+ bilaterally. Skin: Warm and dry without rash.  EKG: Sinus bradycardia (heart rate 43 bpm).  Lab Results  Component Value Date   WBC 5.0 04/05/2019   HGB 11.4 04/05/2019   HCT 34.8 04/05/2019   MCV 93 04/05/2019   PLT 206 04/05/2019    Lab Results  Component Value Date   NA 140 04/05/2019   K 5.0 04/05/2019   CL 105 04/05/2019   CO2 23 04/05/2019   BUN 26 04/05/2019   CREATININE 1.39 (H) 04/05/2019   GLUCOSE 97 04/05/2019   ALT 9 02/02/2018    Lab Results  Component Value Date   CHOL 191 07/16/2018   HDL 68.00 07/16/2018   LDLCALC 111 (H) 07/16/2018   LDLDIRECT 104.4 11/26/2012   TRIG 57.0 07/16/2018   CHOLHDL 3 07/16/2018     --------------------------------------------------------------------------------------------------  ASSESSMENT AND PLAN: Sinus bradycardia and PSVT: Patient is notably bradycardic today on metoprolol tartrate 12.5 mg twice daily.  Overall, palpitations seem unchanged since reinitiation of metoprolol in June.  Though she demonstrates an appropriate chronotropic response, I am somewhat concerned by her resting bradycardia in the low 40s.  We have therefore agreed to discontinue metoprolol.  If her palpitations were to worsen, we would make arrangements for [name] to see Dr. Caryl Comes in the electrophysiology clinic to discuss other treatment options.  Persistent atrial fibrillation: Patient maintaining sinus rhythm today, with monitor year showing no significant atrial fibrillation but multiple brief episodes of SVT.  As above, we will discontinue metoprolol in the setting of bradycardia and consider EP referral if symptoms worsen.  Patient will remain on anticoagulation with apixaban given CHADSVASC score of at least 4.  Valvular heart disease: Other than slight lower extremity edema and stable exertional dyspnea, [name] is doing well.  LVEF was normal on echocardiogram yesterday with moderate mitral  tricuspid regurgitation as well asmild aortic insufficiency.  We will work on blood pressure control and gentle diuresis with the addition of HCTZ 12.5 mg daily.  RVSP was mildly elevated.  Hypertension: Blood pressure seems somewhat labile and is moderately elevated today.  Of note, the patient has been off spironolactone for at least 1 to 2 months.  Given borderline hyperkalemia in the past while on spironolactone, I favor trial of an alternative agent at this time.  We will start HCTZ 12.5 mg daily with repeat BMP in 2 to 4 weeks.  We will hold metoprolol, as outlined above, given bradycardia.  Follow-up: Return to clinic in 2 months.  Nelva Bush, MD 05/15/2019 3:10 PM

## 2019-05-15 NOTE — Patient Instructions (Signed)
Medication Instructions:  - Your physician has recommended you make the following change in your medication:   1) Stop metoprolol  2) Start hydrochlorothiazide (HCTZ) 12.5 mg- take 1 tablet by mouth once daily  If you need a refill on your cardiac medications before your next appointment, please call your pharmacy.   Lab work: - Your physician recommends that you return for lab work in: 3 weeks (the week of 06/03/19)- BMP You may to the [facility], 1st desk on the right (Registration) to check in Labs are done on a walk-in basis Monday-Friday (7:30 am- 5:30 pm) You do not have to be fasting  If you have labs (blood work) drawn today and your tests are completely normal, you will receive your results only by: Marland Kitchen MyChart Message (if you have MyChart) OR . A paper copy in the mail If you have any lab test that is abnormal or we need to change your treatment, we will call you to review the results.  Testing/Procedures: - none ordered  Follow-Up: At [facility], you and your health needs are our priority.  As part of our continuing mission to provide you with exceptional heart care, we have created designated Provider Care Teams.  These Care Teams include your primary Cardiologist (physician) and Advanced Practice Providers (APPs -  Physician Assistants and Nurse Practitioners) who all work together to provide you with the care you need, when you need it. . in 2 months with [name] APP  Any Other Special Instructions Will Be Listed Below (If Applicable). - N/A

## 2019-05-16 ENCOUNTER — Emergency Department
Admission: EM | Admit: 2019-05-16 | Discharge: 2019-05-16 | Disposition: A | Discharge status: Home / Self Care | Payer: Medicare Other | Attending: Emergency Medicine | Admitting: Emergency Medicine

## 2019-05-16 ENCOUNTER — Encounter: Payer: Self-pay | Admitting: Emergency Medicine

## 2019-05-16 ENCOUNTER — Encounter: Payer: Self-pay | Admitting: Internal Medicine

## 2019-05-16 ENCOUNTER — Other Ambulatory Visit: Payer: Self-pay | Admitting: Family Medicine

## 2019-05-16 DIAGNOSIS — I471 Supraventricular tachycardia: Secondary | ICD-10-CM | POA: Insufficient documentation

## 2019-05-16 DIAGNOSIS — Z7901 Long term (current) use of anticoagulants: Secondary | ICD-10-CM | POA: Diagnosis not present

## 2019-05-16 DIAGNOSIS — I2510 Atherosclerotic heart disease of native coronary artery without angina pectoris: Secondary | ICD-10-CM | POA: Diagnosis not present

## 2019-05-16 DIAGNOSIS — Z79899 Other long term (current) drug therapy: Secondary | ICD-10-CM | POA: Diagnosis not present

## 2019-05-16 DIAGNOSIS — L0103 Bullous impetigo: Secondary | ICD-10-CM | POA: Diagnosis not present

## 2019-05-16 DIAGNOSIS — N183 Chronic kidney disease, stage 3 (moderate): Secondary | ICD-10-CM | POA: Diagnosis not present

## 2019-05-16 DIAGNOSIS — I129 Hypertensive chronic kidney disease with stage 1 through stage 4 chronic kidney disease, or unspecified chronic kidney disease: Secondary | ICD-10-CM | POA: Insufficient documentation

## 2019-05-16 MED ORDER — CEPHALEXIN 500 MG PO CAPS: 500.0000 mg | ORAL_CAPSULE | Freq: Three times a day (TID) | ORAL | 0 refills | Status: AC

## 2019-05-16 MED ORDER — CEPHALEXIN 500 MG PO CAPS
500.0000 mg | ORAL_CAPSULE | Freq: Once | ORAL | Status: AC
  Administered 2019-05-16: 500 mg via ORAL
  Filled 2019-05-16: qty 1

## 2019-05-16 NOTE — ED Provider Notes (Signed)
[facility] Emergency Department Provider Note  ____________________________________________  Time seen: Approximately 10:50 PM  I have reviewed the triage vital signs and the nursing notes.   HISTORY  Chief Complaint Blister   HPI [name] is a 78 y.o. female who presents to the emergency department for treatment and evaluation of blister on her left foot, third toe that is painful.  She is noticed some drainage in the area.  She is unaware of any injury.  She has had a similar skin lesion in the past and her dermatologist treated her with an antibiotic.   Past Medical History:  Diagnosis Date  . Anemia   . Aortic insufficiency    a. echo 11/11: EF 55-60%, mod LVH, mild AS (mean 12), mild AI, mild MR, mild LAE; b. 09/2017 Echo: EF 60-65%, no rwma, mild AS/AI, mod MR. mildly dil LA. PASP 65mmHg.  Marland Kitchen Coronary artery disease, non-occlusive    a. cath 2/09: no CAD, EF 70%  . HYPERLIPIDEMIA   . HYPERTENSION   . OSTEOPENIA   . PAF (paroxysmal atrial fibrillation) ([place])    a. s/p ablation 2013; b. CHADS2VASc -> 4 (HTN, age x 2, female)-->Eliquis.  . Pneumonia 2009  . RA (rheumatoid arthritis) ([place])   . SINUS BRADYCARDIA    a. asymptomatic   . Unspecified glaucoma(365.9)     Patient Active Problem List   Diagnosis Date Noted  . PSVT (paroxysmal supraventricular tachycardia) ([place]) 05/16/2019  . Blistering rash 02/15/2019  . Persistent atrial fibrillation 07/19/2018  . Peripheral edema 02/02/2018  . Acute nonintractable headache 02/02/2018  . Coronary artery disease, non-occlusive 09/14/2017  . PAF (paroxysmal atrial fibrillation) ([place]) 09/14/2017  . Status post ablation of atrial fibrillation 09/14/2017  . Essential hypertension 09/14/2017  . Tinea pedis 07/14/2017  . 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/15/2016  . CKD (chronic kidney disease) stage 3, GFR 30-59 ml/min (HCC) 03/14/2016  . History of rheumatoid arthritis 03/14/2016  . Mitral  regurgitation 12/30/2015  . Obesity 12/02/2015  . Aortic valve insufficiency 12/02/2015  . Vitamin D deficiency 04/17/2015  . Counseling regarding end of life decision making 04/17/2015  . Allergic rhinitis 02/03/2015  . Aortic valve stenosis, mild 08/19/2010  . Osteopenia 05/11/2010  . SINUS BRADYCARDIA 08/19/2009  . Anemia, iron deficiency 12/13/2007  . HYPERLIPIDEMIA 08/23/2007  . UNSPECIFIED GLAUCOMA 10/10/2006    Past Surgical History:  Procedure Laterality Date  . ABLATION OF DYSRHYTHMIC FOCUS    . CARDIAC CATHETERIZATION    . COLONOSCOPY  04/18/08  . Partial hysterectomy--1979    . Thoracentesis   12/20/07      Prior to Admission medications   Medication Sig Start Date End Date Taking? Authorizing Provider  apixaban (ELIQUIS) 5 MG TABS tablet Take 1 tablet (5 mg total) by mouth 2 (two) times daily. 10/31/18   Minna Merritts, MD  B Complex Vitamins (VITAMIN B COMPLEX) TABS Take 1 tablet by mouth daily.     [provider]  Calcium Carb-Cholecalciferol (CALCIUM 1000 + D) 1000-800 MG-UNIT TABS Take 1 tablet by mouth daily.    [provider]  cephALEXin (KEFLEX) 500 MG capsule Take 1 capsule (500 mg total) by mouth 3 (three) times daily for 10 days. 05/16/19 05/26/19  Cleopha Indelicato, Johnette Abraham B, FNP  clotrimazole (LOTRIMIN) 1 % cream APPLY TO AFFECTED AREAS TWICE DAILY 05/16/19   Bedsole, Amy E, MD  hydrochlorothiazide (MICROZIDE) 12.5 MG capsule Take 1 capsule (12.5 mg total) by mouth daily. 05/15/19 08/13/19  End, Harrell Gave,  MD    Allergies Amlodipine, Celecoxib, and Tramadol  Family History  Problem Relation Age of Onset  . Diabetes Other   . Breast cancer Paternal Aunt 47  . Heart disease Mother   . Pancreatic cancer Father   . Atrial fibrillation Brother     Social History Social History   Tobacco Use  . Smoking status: Never Smoker  . Smokeless tobacco: Never Used  Substance Use Topics  . Alcohol use: No    Alcohol/week: 0.0 standard drinks  . Drug  use: No    Review of Systems  Constitutional: Negative for fever. Respiratory: Negative for cough or shortness of breath.  Musculoskeletal: Negative for myalgias Skin: Positive for lesion on the left foot, third toe Neurological: Negative for numbness or paresthesias. ____________________________________________   PHYSICAL EXAM:  VITAL SIGNS: ED Triage Vitals  Enc Vitals Group     BP 05/16/19 2127 (!) 194/90     Pulse Rate 05/16/19 2125 (!) 57     Resp 05/16/19 2125 17     Temp 05/16/19 2125 98.8 F (37.1 C)     Temp Source 05/16/19 2125 Oral     SpO2 05/16/19 2125 99 %     Weight --      Height --      Head Circumference --      Peak Flow --      Pain Score --      Pain Loc --      Pain Edu? --      Excl. in [place]? --      Constitutional: Overall well appearing. Eyes: Conjunctivae are clear without discharge or drainage. Nose: No rhinorrhea noted. Mouth/Throat: Airway is patent.  Neck: No stridor. Unrestricted range of motion observed. Cardiovascular: Capillary refill is <3 seconds.  Respiratory: Respirations are even and unlabored.. Musculoskeletal: Unrestricted range of motion observed. Neurologic: Awake, alert, and oriented x 4.  Skin: Flaccid, fluid-filled bullae noted on the left third toe that is draining a clear/yellow fluid.  Crusted lesion noted on the pretibial area of the left lower extremity.  ____________________________________________   LABS (all labs ordered are listed, but only abnormal results are displayed)  Labs Reviewed - No data to display ____________________________________________  EKG  Not indicated. ____________________________________________  RADIOLOGY  Not indicated ____________________________________________   PROCEDURES  .Marland KitchenIncision and Drainage  Date/Time: 05/17/2019 12:13 AM Performed by: Victorino Dike, FNP Authorized by: Victorino Dike, FNP   Consent:    Consent obtained:  Verbal   Consent given by:   Patient   Risks discussed:  Infection, incomplete drainage and pain Location:    Type:  Bulla   Location:  Lower extremity   Lower extremity location:  Toe   Toe location:  L third toe Pre-procedure details:    Skin preparation:  Chloraprep Anesthesia (see MAR for exact dosages):    Anesthesia method:  None Procedure type:    Complexity:  Simple Procedure details:    Incision types:  Stab incision   Scalpel blade:  11   Drainage characteristics: Clear/yellow.   Drainage amount:  Moderate   Wound treatment:  Wound left open   Packing materials:  None Post-procedure details:    Patient tolerance of procedure:  Tolerated well, no immediate complications   ____________________________________________   INITIAL IMPRESSION / ASSESSMENT AND PLAN / ED COURSE  [name] is a 78 y.o. female presents to the emergency department for treatment and evaluation of a blister on her left foot.  See  HPI for further details.  Although it seems unlikely based on patient's age, I do believe that this is bullous impetigo.  The lesion on the pretibial area does have a honey colored crusted covering.  Per patient request and in order to prevent the bullae from bursting tonight while asleep, it was cleansed then opened just enough to express the fluid.    See above for procedure details.  She will be placed on Keflex and advised to either follow-up with primary care or dermatology for symptoms that are not improving over the next week or so.  She was advised return to the emergency department for symptoms change or worsen if unable to schedule appointment.  Medications  cephALEXin (KEFLEX) capsule 500 mg (500 mg Oral Given 05/16/19 2309)     Pertinent labs & imaging results that were available during my care of the patient were reviewed by me and considered in my medical decision making (see chart for details).  ____________________________________________   FINAL CLINICAL IMPRESSION(S) / ED  DIAGNOSES  Final diagnoses:  Bullous impetigo    ED Discharge Orders         Ordered    cephALEXin (KEFLEX) 500 MG capsule  3 times daily     05/16/19 2305           Note:  This document was prepared using Dragon voice recognition software and may include unintentional dictation errors.   Victorino Dike, FNP 05/17/19 0015    Schuyler Amor, MD 05/17/19 [phone]

## 2019-05-16 NOTE — Discharge Instructions (Signed)
Please follow up with your primary care provider for symptoms that are not improving over the next week.  Return to the ER for symptoms that change or worsen if unable to schedule an appointment.

## 2019-05-16 NOTE — ED Triage Notes (Signed)
Pt reports she has had a blister come up on her left 3rd toe with associated pain. Pt reports drainage. Area is raised with clear liquid inside of area. Pt unaware of injury to area.

## 2019-05-17 ENCOUNTER — Telehealth: Payer: Self-pay

## 2019-05-17 NOTE — Telephone Encounter (Signed)
Per chart review tab pt went to [facility] ED on 05/16/19.

## 2019-05-17 NOTE — Telephone Encounter (Signed)
[facility] Night - Client TELEPHONE ADVICE RECORD Access[employer]urse Patient Name: [name] Gender: Female DOB: 09-24-1941 Age: 78 Y 10 M 28 D Return Phone Number: [phone] (Primary), [phone] (Secondary) Address: City/State/Zip: [place] [address] Client [facility] Night - Client Client Site [place] Physician Eliezer Lofts - MD Contact Type Call Who Is Calling Patient / Member / Family / Caregiver Call Type Triage / Clinical Caller Name [name] Relationship To Patient Daughter Return Phone Number [phone] (Primary) Chief Complaint Foot Pain Reason for Call Symptomatic / Request for [place] states that her mom woke up and her foot has a big blister on it. She also needs to reset her password on the portal. Translation No Nurse Assessment Nurse: Luvenia Starch, RN, Lonn Georgia Date/Time (Eastern Time): 05/16/2019 8:22:41 PM Confirm and document reason for call. If symptomatic, describe symptoms. ---Caller reports pt has woken up with a big blister on her left foot on her middle toe. She has another place coming up next to her ankle. It is red, warm, and painful. This began last night. Has the patient had close contact with a person known or suspected to have the novel coronavirus illness OR traveled / lives in area with major community spread (including international travel) in the last 14 days from the onset of symptoms? * If Asymptomatic, screen for exposure and travel within the last 14 days. ---No Does the patient have any new or worsening symptoms? ---Yes Will a triage be completed? ---Yes Related visit to physician within the last 2 weeks? ---No Does the PT have any chronic conditions? (i.e. diabetes, asthma, this includes High risk factors for pregnancy, etc.) ---Yes List chronic conditions. ---afib, hypertension Is this a behavioral health or  substance abuse call? ---No Guidelines Guideline Title Affirmed Question Affirmed Notes Nurse Date/Time (Eastern Time) Foot Pain Entire foot is cool or blue in comparison to other foot [place], RN, Lonn Georgia 05/16/2019 8:26:39 PM

## 2019-05-18 DIAGNOSIS — Z7901 Long term (current) use of anticoagulants: Secondary | ICD-10-CM | POA: Diagnosis not present

## 2019-05-18 DIAGNOSIS — L03031 Cellulitis of right toe: Secondary | ICD-10-CM | POA: Diagnosis not present

## 2019-05-18 DIAGNOSIS — L14 Bullous disorders in diseases classified elsewhere: Secondary | ICD-10-CM | POA: Diagnosis not present

## 2019-05-18 DIAGNOSIS — I10 Essential (primary) hypertension: Secondary | ICD-10-CM | POA: Diagnosis not present

## 2019-05-18 DIAGNOSIS — Z885 Allergy status to narcotic agent status: Secondary | ICD-10-CM | POA: Diagnosis not present

## 2019-05-18 DIAGNOSIS — Z79899 Other long term (current) drug therapy: Secondary | ICD-10-CM | POA: Diagnosis not present

## 2019-05-18 DIAGNOSIS — Z888 Allergy status to other drugs, medicaments and biological substances status: Secondary | ICD-10-CM | POA: Diagnosis not present

## 2019-05-18 DIAGNOSIS — I4891 Unspecified atrial fibrillation: Secondary | ICD-10-CM | POA: Diagnosis not present

## 2019-05-21 ENCOUNTER — Ambulatory Visit (INDEPENDENT_AMBULATORY_CARE_PROVIDER_SITE_OTHER): Payer: Medicare Other | Admitting: Family Medicine

## 2019-05-21 ENCOUNTER — Other Ambulatory Visit: Payer: Self-pay

## 2019-05-21 ENCOUNTER — Encounter: Payer: Self-pay | Admitting: Family Medicine

## 2019-05-21 ENCOUNTER — Telehealth: Payer: Self-pay | Admitting: Family Medicine

## 2019-05-21 VITALS — BP 130/90 | HR 69 | Temp 97.9°F | Ht 62.75 in | Wt 148.5 lb

## 2019-05-21 DIAGNOSIS — R21 Rash and other nonspecific skin eruption: Secondary | ICD-10-CM | POA: Diagnosis not present

## 2019-05-21 DIAGNOSIS — L0103 Bullous impetigo: Secondary | ICD-10-CM

## 2019-05-21 NOTE — Progress Notes (Signed)
VIRTUAL VISIT Due to national recommendations of social distancing due to [place] 19, a virtual visit is felt to be most appropriate for this patient at this time.   I connected with the patient on 05/21/19 at 11:40 AM EDT by virtual telehealth platform and verified that I am speaking with the correct person using two identifiers.   I discussed the limitations, risks, security and privacy concerns of performing an evaluation and management service by  virtual telehealth platform and the availability of in person appointments. I also discussed with the patient that there may be a patient responsible charge related to this service. The patient expressed understanding and agreed to proceed.  Patient location: Home Provider Location: [facility] Participants: Eliezer Lofts and Rosalyn Gess   Chief Complaint  Patient presents with  . Hospitalization Follow-up    Bullous Impetigo    History of Present Illness: 78 year old female presents following ER visit for rash/blister on toe.Marland Kitchen dx with impetigo bullous.   First noted blister  1.5 weeks ago. Started like a small pustule.  No injury. 05/16/2019  I and D performed  Started keflex. 05/18/2019 filled right back up.  Mild pain.  BP was high.   Started on HCTZ 12.5 mg daily.  Given doxycycline.. but did not start. Was not sure if she should. BP Readings from Last 3 Encounters:  05/21/19 130/90  05/16/19 (!) 215/82  05/15/19 (!) 158/88   Referred to dermatology.. has not set up appt yet.  Seeing [name] for PSVT and afib... stopped metoprolol Also with bradycardia  No  On eliquis.   Had similar in past in 02/2019... saw Dr. Ronnald Ramp.  COVID 19 screen No recent travel or known exposure to COVID19 The patient denies respiratory symptoms of COVID 19 at this time.  The importance of social distancing was discussed today.   Review of Systems  Constitutional: Negative for chills and fever.  HENT: Negative for congestion and ear pain.    Eyes: Negative for pain and redness.  Respiratory: Negative for cough and shortness of breath.   Cardiovascular: Negative for chest pain, palpitations and leg swelling.  Gastrointestinal: Negative for abdominal pain, blood in stool, constipation, diarrhea, nausea and vomiting.  Genitourinary: Negative for dysuria.  Musculoskeletal: Negative for falls and myalgias.  Skin: Negative for rash.  Neurological: Negative for dizziness.  Psychiatric/Behavioral: Negative for depression. The patient is not nervous/anxious.       Past Medical History:  Diagnosis Date  . Anemia   . Aortic insufficiency    a. echo 11/11: EF 55-60%, mod LVH, mild AS (mean 12), mild AI, mild MR, mild LAE; b. 09/2017 Echo: EF 60-65%, no rwma, mild AS/AI, mod MR. mildly dil LA. PASP 66mmHg.  Marland Kitchen Coronary artery disease, non-occlusive    a. cath 2/09: no CAD, EF 70%  . HYPERLIPIDEMIA   . HYPERTENSION   . OSTEOPENIA   . PAF (paroxysmal atrial fibrillation) ([place])    a. s/p ablation 2013; b. CHADS2VASc -> 4 (HTN, age x 2, female)-->Eliquis.  . Pneumonia 2009  . RA (rheumatoid arthritis) ([place])   . SINUS BRADYCARDIA    a. asymptomatic   . Unspecified glaucoma(365.9)     reports that she has never smoked. She has never used smokeless tobacco. She reports that she does not drink alcohol or use drugs.   Current Outpatient Medications:  .  apixaban (ELIQUIS) 5 MG TABS tablet, Take 1 tablet (5 mg total) by mouth 2 (two) times daily., Disp: 180  tablet, Rfl: 0 .  B Complex Vitamins (VITAMIN B COMPLEX) TABS, Take 1 tablet by mouth daily. , Disp: , Rfl:  .  Calcium Carb-Cholecalciferol (CALCIUM 1000 + D) 1000-800 MG-UNIT TABS, Take 1 tablet by mouth daily., Disp: , Rfl:  .  cephALEXin (KEFLEX) 500 MG capsule, Take 1 capsule (500 mg total) by mouth 3 (three) times daily for 10 days., Disp: 30 capsule, Rfl: 0 .  clotrimazole (LOTRIMIN) 1 % cream, APPLY TO AFFECTED AREAS TWICE DAILY, Disp: 42.5 g, Rfl: 0 .  hydrochlorothiazide  (MICROZIDE) 12.5 MG capsule, Take 1 capsule (12.5 mg total) by mouth daily., Disp: 90 capsule, Rfl: 3   Observations/Objective: Blood pressure 130/90, pulse 69, temperature 97.9 F (36.6 C), temperature source Temporal, height 5' 2.75" (1.594 m), weight 148 lb 8 oz (67.4 kg), SpO2 99 %.  Physical Exam Constitutional:      General: She is not in acute distress.    Appearance: Normal appearance. She is well-developed. She is not ill-appearing or toxic-appearing.  HENT:     Head: Normocephalic.     Right Ear: Hearing, tympanic membrane, ear canal and external ear normal. Tympanic membrane is not erythematous, retracted or bulging.     Left Ear: Hearing, tympanic membrane, ear canal and external ear normal. Tympanic membrane is not erythematous, retracted or bulging.     Nose: No mucosal edema or rhinorrhea.     Right Sinus: No maxillary sinus tenderness or frontal sinus tenderness.     Left Sinus: No maxillary sinus tenderness or frontal sinus tenderness.     Mouth/Throat:     Pharynx: Uvula midline.  Eyes:     General: Lids are normal. Lids are everted, no foreign bodies appreciated.     Conjunctiva/sclera: Conjunctivae normal.     Pupils: Pupils are equal, round, and reactive to light.  Neck:     Musculoskeletal: Normal range of motion and neck supple.     Thyroid: No thyroid mass or thyromegaly.     Vascular: No carotid bruit.     Trachea: Trachea normal.  Cardiovascular:     Rate and Rhythm: Normal rate and regular rhythm.     Pulses: Normal pulses.     Heart sounds: Normal heart sounds, S1 normal and S2 normal. No murmur. No friction rub. No gallop.   Pulmonary:     Effort: Pulmonary effort is normal. No tachypnea or respiratory distress.     Breath sounds: Normal breath sounds. No decreased breath sounds, wheezing, rhonchi or rales.  Abdominal:     General: Bowel sounds are normal.     Palpations: Abdomen is soft.     Tenderness: There is no abdominal tenderness.  Skin:     General: Skin is warm and dry.     Findings: No rash.     Comments: Yellow pus filled bulla on left dorsal foot at base of 2nd digit  Neurological:     Mental Status: She is alert.  Psychiatric:        Mood and Affect: Mood is not anxious or depressed.        Speech: Speech normal.        Behavior: Behavior normal. Behavior is cooperative.        Thought Content: Thought content normal.        Judgment: Judgment normal.      Assessment and Plan    I discussed the assessment and treatment plan with the patient. The patient was provided an opportunity to ask questions and  all were answered. The patient agreed with the plan and demonstrated an understanding of the instructions.   The patient was advised to call back or seek an in-person evaluation if the symptoms worsen or if the condition fails to improve as anticipated.     Eliezer Lofts, MD

## 2019-05-21 NOTE — Telephone Encounter (Signed)
Pt's daughter [name] came back in after appointment  and said the Dermatology's office wants a referral or paperwork sent to them so they can get her in sooner for appointment.

## 2019-05-21 NOTE — Patient Instructions (Addendum)
Call to set up appt with Dr. Ronnald Ramp.  Stop keflex and change to doxycycline.  Check BP at home.. call if > 140/90. Login ID: [email or web address]  Password [phone]

## 2019-05-23 DIAGNOSIS — L308 Other specified dermatitis: Secondary | ICD-10-CM | POA: Diagnosis not present

## 2019-05-23 DIAGNOSIS — L986 Other infiltrative disorders of the skin and subcutaneous tissue: Secondary | ICD-10-CM | POA: Diagnosis not present

## 2019-05-27 NOTE — Assessment & Plan Note (Signed)
Stop keflex and change to doxycycline. Refer to dermatology given recurrence of rash and unclear etiology.

## 2019-06-06 ENCOUNTER — Other Ambulatory Visit: Payer: Self-pay | Admitting: *Deleted

## 2019-06-06 ENCOUNTER — Telehealth: Payer: Self-pay | Admitting: Internal Medicine

## 2019-06-06 DIAGNOSIS — Z79899 Other long term (current) drug therapy: Secondary | ICD-10-CM

## 2019-06-06 DIAGNOSIS — I10 Essential (primary) hypertension: Secondary | ICD-10-CM

## 2019-06-06 DIAGNOSIS — I480 Paroxysmal atrial fibrillation: Secondary | ICD-10-CM

## 2019-06-06 NOTE — Telephone Encounter (Signed)
Pt c/o BP issue: STAT if pt c/o blurred vision, one-sided weakness or slurred speech  1. What are your last 5 BP readings? Says top number has been high - ~160/80, ~170/80  2. Are you having any other symptoms (ex. Dizziness, headache, blurred vision, passed out)? no  3. What is your BP issue? Feels that BP has still been high, even with medication changes.  Pulse has been better, in 50s - 60s but still would like to know what would be best for BP.  Please call to discuss.

## 2019-06-06 NOTE — Telephone Encounter (Signed)
Patient had lab work at [facility] today. Awaiting results. Spoke with patient and says her HR has improved to the 50-60's but her BP has remained high around 160-170/80's.  She started HCTZ 12.5 mg daily around 05/15/19. Advised I will route to Dr End for further advice.

## 2019-06-07 ENCOUNTER — Encounter: Payer: Self-pay | Admitting: Internal Medicine

## 2019-06-07 ENCOUNTER — Other Ambulatory Visit: Payer: Self-pay | Admitting: Internal Medicine

## 2019-06-07 DIAGNOSIS — Z79899 Other long term (current) drug therapy: Secondary | ICD-10-CM | POA: Diagnosis not present

## 2019-06-07 DIAGNOSIS — I10 Essential (primary) hypertension: Secondary | ICD-10-CM | POA: Diagnosis not present

## 2019-06-07 DIAGNOSIS — I480 Paroxysmal atrial fibrillation: Secondary | ICD-10-CM | POA: Diagnosis not present

## 2019-06-07 NOTE — Telephone Encounter (Signed)
Pt had office visit on 05/21/19

## 2019-06-08 LAB — BASIC METABOLIC PANEL
BUN/Creatinine Ratio: 14 (ref 12–28)
BUN: 18 mg/dL (ref 8–27)
CO2: 26 mmol/L (ref 20–29)
Calcium: 9.8 mg/dL (ref 8.7–10.3)
Chloride: 102 mmol/L (ref 96–106)
Creatinine, Ser: 1.32 mg/dL — ABNORMAL HIGH (ref 0.57–1.00)
GFR calc Af Amer: 45 mL/min/{1.73_m2} — ABNORMAL LOW (ref >59)
GFR calc non Af Amer: 39 mL/min/{1.73_m2} — ABNORMAL LOW (ref >59)
Glucose: 94 mg/dL (ref 65–99)
Potassium: 3.6 mmol/L (ref 3.5–5.2)
Sodium: 142 mmol/L (ref 134–144)

## 2019-06-08 NOTE — Telephone Encounter (Signed)
I do not see results of the labs in our system.  If renal function and electrolytes are stable, I recommend increasing HCTZ to 25 mg daily and following up in the office in ~1 month for reevaluation.  Nelva Bush, MD [facility] Pager: [phone]

## 2019-06-10 NOTE — Telephone Encounter (Signed)
Called patient to let her know I am in the process of getting lab results from [facility] and then will let her know Dr Darnelle Bos recommendations.

## 2019-06-10 NOTE — Telephone Encounter (Signed)
Called [facility] and they are faxing patient's results from 06/07/19 to [place] now. Awaiting results.

## 2019-06-10 NOTE — Telephone Encounter (Signed)
Labs received and scanned to Dr End for review.  Routing to Dr End.

## 2019-06-10 NOTE — Telephone Encounter (Signed)
Labs are stable.  It is okay to increase to increase HCTZ to 25 mg daily.  We will need repeat BMP in ~1 month.  Nelva Bush, MD [facility] Pager: [phone]

## 2019-06-11 MED ORDER — HYDROCHLOROTHIAZIDE 25 MG PO TABS: 25.0000 mg | ORAL_TABLET | Freq: Every day | ORAL | 4 refills | Status: DC

## 2019-06-11 NOTE — Telephone Encounter (Signed)
Patient notified of results, to increase HCTZ to 25 mg daily, and repeat BMET in 1 month.  Rx sent to pharmacy.  Called patient's grandaughter, Lars Mage, who is on the [facility]. She verbalized understanding as well. SHe is aware patient should go to the [facility] in about 1 month for the lab work.

## 2019-07-17 ENCOUNTER — Other Ambulatory Visit
Admission: RE | Admit: 2019-07-17 | Discharge: 2019-07-17 | Disposition: A | Discharge status: Home / Self Care | Payer: Medicare Other | Source: Ambulatory Visit | Attending: Internal Medicine | Admitting: Internal Medicine

## 2019-07-17 ENCOUNTER — Ambulatory Visit (INDEPENDENT_AMBULATORY_CARE_PROVIDER_SITE_OTHER): Payer: Medicare Other | Admitting: Internal Medicine

## 2019-07-17 ENCOUNTER — Encounter: Payer: Self-pay | Admitting: Internal Medicine

## 2019-07-17 ENCOUNTER — Other Ambulatory Visit: Payer: Self-pay

## 2019-07-17 VITALS — BP 152/80 | HR 59 | Ht 63.0 in | Wt 152.2 lb

## 2019-07-17 DIAGNOSIS — R001 Bradycardia, unspecified: Secondary | ICD-10-CM | POA: Insufficient documentation

## 2019-07-17 DIAGNOSIS — I471 Supraventricular tachycardia: Secondary | ICD-10-CM | POA: Diagnosis not present

## 2019-07-17 DIAGNOSIS — Z23 Encounter for immunization: Secondary | ICD-10-CM

## 2019-07-17 DIAGNOSIS — Z79899 Other long term (current) drug therapy: Secondary | ICD-10-CM

## 2019-07-17 DIAGNOSIS — M069 Rheumatoid arthritis, unspecified: Secondary | ICD-10-CM | POA: Diagnosis not present

## 2019-07-17 DIAGNOSIS — I4819 Other persistent atrial fibrillation: Secondary | ICD-10-CM | POA: Diagnosis not present

## 2019-07-17 DIAGNOSIS — I10 Essential (primary) hypertension: Secondary | ICD-10-CM | POA: Diagnosis not present

## 2019-07-17 DIAGNOSIS — I38 Endocarditis, valve unspecified: Secondary | ICD-10-CM

## 2019-07-17 DIAGNOSIS — M8580 Other specified disorders of bone density and structure, unspecified site: Secondary | ICD-10-CM | POA: Diagnosis not present

## 2019-07-17 DIAGNOSIS — E785 Hyperlipidemia, unspecified: Secondary | ICD-10-CM | POA: Diagnosis not present

## 2019-07-17 LAB — BASIC METABOLIC PANEL
Anion gap: 11 (ref 5–15)
BUN: 26 mg/dL — ABNORMAL HIGH (ref 8–23)
CO2: 29 mmol/L (ref 22–32)
Calcium: 9.9 mg/dL (ref 8.9–10.3)
Chloride: 102 mmol/L (ref 98–111)
Creatinine, Ser: 1.67 mg/dL — ABNORMAL HIGH (ref 0.44–1.00)
GFR calc Af Amer: 34 mL/min — ABNORMAL LOW (ref >60)
GFR calc non Af Amer: 29 mL/min — ABNORMAL LOW (ref >60)
Glucose, Bld: 107 mg/dL — ABNORMAL HIGH (ref 70–99)
Potassium: 4 mmol/L (ref 3.5–5.1)
Sodium: 142 mmol/L (ref 135–145)

## 2019-07-17 MED ORDER — LOSARTAN POTASSIUM 25 MG PO TABS: 25.0000 mg | ORAL_TABLET | Freq: Every day | ORAL | 3 refills | Status: DC

## 2019-07-17 NOTE — Patient Instructions (Addendum)
You will receive the flu shot today.  Medication Instructions:  Your physician has recommended you make the following change in your medication:  1- START Losartan 25 mg by mouth once a day.  If you need a refill on your cardiac medications before your next appointment, please call your pharmacy.   Lab work: 1) Your physician recommends that you return for lab work in: [place] - at [facility].    2) Your physician recommends that you return for lab work in: [place]. - Please go to the [facility]. You will check in at the front desk to the right as you walk into the atrium. Valet Parking is offered if needed. - No appointment is needed. You may go anytime between 7 am and 6 pm any day of the week.    If you have labs (blood work) drawn today and your tests are completely normal, you will receive your results only by: Marland Kitchen MyChart Message (if you have MyChart) OR . A paper copy in the mail If you have any lab test that is abnormal or we need to change your treatment, we will call you to review the results.  Testing/Procedures: NONE  Follow-Up: At [facility], you and your health needs are our priority.  As part of our continuing mission to provide you with exceptional heart care, we have created designated Provider Care Teams.  These Care Teams include your primary Cardiologist (physician) and Advanced Practice Providers (APPs -  Physician Assistants and Nurse Practitioners) who all work together to provide you with the care you need, when you need it. You will need a follow up appointment in 3 months.  You may see Nelva Bush, MD or one of the following Advanced Practice Providers on your designated Care Team:   Murray Hodgkins, NP Christell Faith, PA-C . Marrianne Mood, PA-C

## 2019-07-17 NOTE — Progress Notes (Signed)
Follow-up Outpatient Visit Date: 07/17/2019  Primary Care Provider: [name], MD [place] [address]  Chief Complaint: Follow-up hypertension and leg swelling  HPI:  [name] is a 78 y.o. year-old female with history of nonobstructive CAD, paroxysmal atrial fibrillation status post ablation, asymptomatic bradycardia, mitral and aortic regurgitation, chronic kidney disease stage II, hypertension, and hyperlipidemia, who presents for follow-up of leg swelling.  I last saw her in early August, at which time she reported leg swelling over the last few months.  She had not been on spironolactone for 1 to 2 months leading up to this.  Exertional dyspnea was stable.  She reported occasional palpitations.  Given resting bradycardia, we agreed to stop metoprolol.  We added HCTZ 12.5 mg daily for treatment of leg swelling and uncontrolled blood pressure.  Today, Ms. Mabin reports that she feels better with less leg swelling and more energy.  She denies chest pain, shortness of breath, palpitations, lightheadedness, and orthopnea.  Home blood pressures are variable and sometimes normal.  However, they are often elevated, similar to today's reading.  She is tolerating HCTZ well and trying to limit her salt intake.Marland Kitchen  --------------------------------------------------------------------------------------------------  Past Medical History:  Diagnosis Date  . Anemia   . Aortic insufficiency    a. echo 11/11: EF 55-60%, mod LVH, mild AS (mean 12), mild AI, mild MR, mild LAE; b. 09/2017 Echo: EF 60-65%, no rwma, mild AS/AI, mod MR. mildly dil LA. PASP 23mmHg.  Marland Kitchen Coronary artery disease, non-occlusive    a. cath 2/09: no CAD, EF 70%  . HYPERLIPIDEMIA   . HYPERTENSION   . OSTEOPENIA   . PAF (paroxysmal atrial fibrillation) ([place])    a. s/p ablation 2013; b. CHADS2VASc -> 4 (HTN, age x 2, female)-->Eliquis.  . Pneumonia 2009  . RA (rheumatoid arthritis) ([place])   . SINUS  BRADYCARDIA    a. asymptomatic   . Unspecified glaucoma(365.9)    Past Surgical History:  Procedure Laterality Date  . ABLATION OF DYSRHYTHMIC FOCUS    . CARDIAC CATHETERIZATION    . COLONOSCOPY  04/18/08  . Partial hysterectomy--1979    . Thoracentesis   12/20/07      Current Meds  Medication Sig  . apixaban (ELIQUIS) 5 MG TABS tablet Take 1 tablet (5 mg total) by mouth 2 (two) times daily.  . B Complex Vitamins (VITAMIN B COMPLEX) TABS Take 1 tablet by mouth daily.   . Calcium Carb-Cholecalciferol (CALCIUM 1000 + D) 1000-800 MG-UNIT TABS Take 1 tablet by mouth daily.  . clotrimazole (LOTRIMIN) 1 % cream APPLY TO AFFECTED AREAS TWICE DAILY  . hydrochlorothiazide (HYDRODIURIL) 25 MG tablet Take 1 tablet (25 mg total) by mouth daily.    Allergies: Amlodipine, Celecoxib, and Tramadol  Social History   Tobacco Use  . Smoking status: Never Smoker  . Smokeless tobacco: Never Used  Substance Use Topics  . Alcohol use: No    Alcohol/week: 0.0 standard drinks  . Drug use: No    Family History  Problem Relation Age of Onset  . Diabetes Other   . Breast cancer Paternal Aunt 80  . Heart disease Mother   . Pancreatic cancer Father   . Atrial fibrillation Brother     Review of Systems: A 12-system review of systems was performed and was negative except as noted in the HPI.  --------------------------------------------------------------------------------------------------  Physical Exam: BP (!) 152/80 (BP Location: Left Arm, Patient Position: Sitting, Cuff Size: Normal)   Pulse Marland Kitchen)  59   Ht 5\' 3"  (1.6 m)   Wt 152 lb 4 oz (69.1 kg)   BMI 26.97 kg/m   General:  NAD HEENT: No conjunctival pallor or scleral icterus. Facemask in place Neck: Supple without lymphadenopathy, thyromegaly, JVD, or HJR. Lungs: Normal work of breathing. Clear to auscultation bilaterally without wheezes or crackles. Heart: Regular rate and rhythm with 2/6 systolic murmur.  No rubs or gallops.. Abd:  Bowel sounds present. Soft, NT/ND without hepatosplenomegaly Ext: No lower extremity edema. Skin: Warm and dry without rash.  EKG:  Sinus bradycardia versus ectopic atrial rhythm with frequent PAC's and non-specific ST/T changes.  Compared with prior tracing from 05/15/2019, HR has increased and PAC's are now present.  Lab Results  Component Value Date   WBC 5.0 04/05/2019   HGB 11.4 04/05/2019   HCT 34.8 04/05/2019   MCV 93 04/05/2019   PLT 206 04/05/2019    Lab Results  Component Value Date   NA 142 06/07/2019   K 3.6 06/07/2019   CL 102 06/07/2019   CO2 26 06/07/2019   BUN 18 06/07/2019   CREATININE 1.32 (H) 06/07/2019   GLUCOSE 94 06/07/2019   ALT 9 02/02/2018    Lab Results  Component Value Date   CHOL 191 07/16/2018   HDL 68.00 07/16/2018   LDLCALC 111 (H) 07/16/2018   LDLDIRECT 104.4 11/26/2012   TRIG 57.0 07/16/2018   CHOLHDL 3 07/16/2018    --------------------------------------------------------------------------------------------------  ASSESSMENT AND PLAN: Sinus bradycardia and PSVT: Heart rate has increased since our last visit when metoprolol was discontinued.  PAC's are evident on today's EKG, though Ms. Monroy denies significant palpitations.  We will defer rechallenging her with a beta blocker at this time.  If palpitations recur, we will need to consider referral to EP.  Persistent atrial fibrillation: EKG today shows sinus bradycardia verus ectopic atrial rhythm with PAC's.  No symptoms to suggest recurrence of a-fib.  We will continue indefinite anticoagulation with apixaban, given CHADSVASC score of at least 4  Valvular heart disease: Mr. Portilla appears euvolemic on exam with improved energy and less leg swelling since HCTZ was added.  We will defer further testing at this time.  As BP remains elevated, I will check a BMP today and start losartan 25 mg daily if renal function allows.  She will need a repeat BMP in ~2 weeks after starting losartan.   Hypertension: BP improved but still not at goal.  We will check a BMP today, given escalation of HCTZ in late August.  If renal function allows, we will add losartan 25 mg daily.  Unfortunately, Ms. Hands has been intolerant of amlodipine and beta-blockers due to leg swelling and bradycardia, respectively.  Follow-up: Return to clinic in 3 months.  Nelva Bush, MD 07/17/2019 3:58 PM

## 2019-07-18 ENCOUNTER — Telehealth: Payer: Self-pay | Admitting: *Deleted

## 2019-07-18 ENCOUNTER — Encounter: Payer: Self-pay | Admitting: Internal Medicine

## 2019-07-18 DIAGNOSIS — N182 Chronic kidney disease, stage 2 (mild): Secondary | ICD-10-CM

## 2019-07-18 DIAGNOSIS — I10 Essential (primary) hypertension: Secondary | ICD-10-CM

## 2019-07-18 DIAGNOSIS — I38 Endocarditis, valve unspecified: Secondary | ICD-10-CM | POA: Insufficient documentation

## 2019-07-18 DIAGNOSIS — Z79899 Other long term (current) drug therapy: Secondary | ICD-10-CM

## 2019-07-18 DIAGNOSIS — R001 Bradycardia, unspecified: Secondary | ICD-10-CM | POA: Insufficient documentation

## 2019-07-18 MED ORDER — HYDROCHLOROTHIAZIDE 25 MG PO TABS: 12.5000 mg | ORAL_TABLET | Freq: Every day | ORAL | 4 refills | Status: DC

## 2019-07-18 NOTE — Telephone Encounter (Signed)
-----   Message from Nelva Bush, MD sent at 07/18/2019 11:54 AM EDT ----- Please let [name] know that her kidney function as worsening slightly since last month.  I recommend that she decrease HCTZ back to 12.5 mg daily and increase her water intake.  Let's have her hold off on starting losartan and have a repeat BMP in 1 week.  We will readdress starting losartan at that time.

## 2019-07-18 NOTE — Telephone Encounter (Signed)
Called and spoke with patient's daughter, dpr. She verbalized understanding of the results, med changes and plan of care. Also messaged patient's granddaughter who is also on DPR. She verbalized understanding of results and plan of care as well.

## 2019-07-19 ENCOUNTER — Telehealth: Payer: Self-pay | Admitting: Family Medicine

## 2019-07-19 DIAGNOSIS — E782 Mixed hyperlipidemia: Secondary | ICD-10-CM

## 2019-07-19 DIAGNOSIS — D508 Other iron deficiency anemias: Secondary | ICD-10-CM

## 2019-07-19 DIAGNOSIS — E559 Vitamin D deficiency, unspecified: Secondary | ICD-10-CM

## 2019-07-19 NOTE — Telephone Encounter (Signed)
-----   Message from Ellamae Sia sent at 07/19/2019 11:15 AM EDT ----- Regarding: Lab orders for Wednesday, 10.14.20 Patient is scheduled for CPX labs, please order future labs, Thanks , Karna Christmas

## 2019-07-25 ENCOUNTER — Ambulatory Visit: Payer: Medicare Other

## 2019-07-25 ENCOUNTER — Telehealth: Payer: Self-pay | Admitting: *Deleted

## 2019-07-25 ENCOUNTER — Other Ambulatory Visit: Payer: Self-pay

## 2019-07-25 ENCOUNTER — Other Ambulatory Visit (INDEPENDENT_AMBULATORY_CARE_PROVIDER_SITE_OTHER): Payer: Medicare Other

## 2019-07-25 ENCOUNTER — Telehealth: Payer: Self-pay

## 2019-07-25 ENCOUNTER — Other Ambulatory Visit
Admission: RE | Admit: 2019-07-25 | Discharge: 2019-07-25 | Disposition: A | Discharge status: Home / Self Care | Payer: Medicare Other | Source: Ambulatory Visit | Attending: Internal Medicine | Admitting: Internal Medicine

## 2019-07-25 DIAGNOSIS — D508 Other iron deficiency anemias: Secondary | ICD-10-CM

## 2019-07-25 DIAGNOSIS — Z79899 Other long term (current) drug therapy: Secondary | ICD-10-CM | POA: Diagnosis not present

## 2019-07-25 DIAGNOSIS — I10 Essential (primary) hypertension: Secondary | ICD-10-CM

## 2019-07-25 DIAGNOSIS — E559 Vitamin D deficiency, unspecified: Secondary | ICD-10-CM | POA: Diagnosis not present

## 2019-07-25 DIAGNOSIS — N182 Chronic kidney disease, stage 2 (mild): Secondary | ICD-10-CM | POA: Insufficient documentation

## 2019-07-25 DIAGNOSIS — E782 Mixed hyperlipidemia: Secondary | ICD-10-CM

## 2019-07-25 LAB — CBC WITH DIFFERENTIAL/PLATELET
Basophils Absolute: 0.1 10*3/uL (ref 0.0–0.1)
Basophils Relative: 0.9 % (ref 0.0–3.0)
Eosinophils Absolute: 0.3 10*3/uL (ref 0.0–0.7)
Eosinophils Relative: 5 % (ref 0.0–5.0)
HCT: 35.8 % — ABNORMAL LOW (ref 36.0–46.0)
Hemoglobin: 11.9 g/dL — ABNORMAL LOW (ref 12.0–15.0)
Lymphocytes Relative: 36.6 % (ref 12.0–46.0)
Lymphs Abs: 2.2 10*3/uL (ref 0.7–4.0)
MCHC: 33.1 g/dL (ref 30.0–36.0)
MCV: 93 fl (ref 78.0–100.0)
Monocytes Absolute: 0.4 10*3/uL (ref 0.1–1.0)
Monocytes Relative: 6.2 % (ref 3.0–12.0)
Neutro Abs: 3 10*3/uL (ref 1.4–7.7)
Neutrophils Relative %: 51.3 % (ref 43.0–77.0)
Platelets: 225 10*3/uL (ref 150.0–400.0)
RBC: 3.85 Mil/uL — ABNORMAL LOW (ref 3.87–5.11)
RDW: 13 % (ref 11.5–15.5)
WBC: 5.9 10*3/uL (ref 4.0–10.5)

## 2019-07-25 LAB — LIPID PANEL
Cholesterol: 223 mg/dL — ABNORMAL HIGH (ref 0–200)
HDL: 76.4 mg/dL (ref >39.00)
LDL Cholesterol: 135 mg/dL — ABNORMAL HIGH (ref 0–99)
NonHDL: 146.17
Total CHOL/HDL Ratio: 3
Triglycerides: 57 mg/dL (ref 0.0–149.0)
VLDL: 11.4 mg/dL (ref 0.0–40.0)

## 2019-07-25 LAB — BASIC METABOLIC PANEL
Anion gap: 13 (ref 5–15)
BUN: 23 mg/dL (ref 8–23)
CO2: 27 mmol/L (ref 22–32)
Calcium: 9.8 mg/dL (ref 8.9–10.3)
Chloride: 101 mmol/L (ref 98–111)
Creatinine, Ser: 1.21 mg/dL — ABNORMAL HIGH (ref 0.44–1.00)
GFR calc Af Amer: 50 mL/min — ABNORMAL LOW (ref >60)
GFR calc non Af Amer: 43 mL/min — ABNORMAL LOW (ref >60)
Glucose, Bld: 99 mg/dL (ref 70–99)
Potassium: 3.4 mmol/L — ABNORMAL LOW (ref 3.5–5.1)
Sodium: 141 mmol/L (ref 135–145)

## 2019-07-25 LAB — VITAMIN D 25 HYDROXY (VIT D DEFICIENCY, FRACTURES): VITD: 37.63 ng/mL (ref 30.00–100.00)

## 2019-07-25 LAB — COMPREHENSIVE METABOLIC PANEL
ALT: 11 U/L (ref 0–35)
AST: 21 U/L (ref 0–37)
Albumin: 4 g/dL (ref 3.5–5.2)
Alkaline Phosphatase: 59 U/L (ref 39–117)
BUN: 22 mg/dL (ref 6–23)
CO2: 31 mEq/L (ref 19–32)
Calcium: 9.7 mg/dL (ref 8.4–10.5)
Chloride: 102 mEq/L (ref 96–112)
Creatinine, Ser: 1.28 mg/dL — ABNORMAL HIGH (ref 0.40–1.20)
GFR: 48.91 mL/min — ABNORMAL LOW (ref >60.00)
Glucose, Bld: 94 mg/dL (ref 70–99)
Potassium: 3.7 mEq/L (ref 3.5–5.1)
Sodium: 141 mEq/L (ref 135–145)
Total Bilirubin: 0.9 mg/dL (ref 0.2–1.2)
Total Protein: 6.9 g/dL (ref 6.0–8.3)

## 2019-07-25 MED ORDER — LOSARTAN POTASSIUM 25 MG PO TABS: 25.0000 mg | ORAL_TABLET | Freq: Every day | ORAL | 3 refills | Status: DC

## 2019-07-25 NOTE — Telephone Encounter (Signed)
-----   Message from Nelva Bush, MD sent at 07/25/2019 11:55 AM EDT ----- Please let Ms. Kathan know that her kidney function is back to her baseline.  Potassium is borderline low.  I recommend that she continue her current dose of HCTZ (12.5 mg daily) and start losartan 25 mg daily.  We will need to repeat a BMP in ~2 weeks.

## 2019-07-25 NOTE — Telephone Encounter (Signed)
Called patient to complete Medicare Wellness visit and patient stated that she was at the hospital having blood work completed for another physician at the moment. Patient declined to do the visit at this time and stated that she would complete it at her upcoming physical with Dr. Diona Browner. Appointment was then cancelled.

## 2019-07-25 NOTE — Progress Notes (Signed)
No critical labs need to be addressed urgently. We will discuss labs in detail at upcoming office visit.   

## 2019-07-25 NOTE — Telephone Encounter (Signed)
Results released to My Chart. Results called to pt. Pt verbalized understanding of results and plan of care. She is aware to continue HCTZ 12.5 mg daily and to start losartan 25 mg daily. States she has already picked up that prescription from last office visit.  She is aware to go to [facility] in about 2 weeks around 10/29 for lab work. Med list updated.

## 2019-07-27 ENCOUNTER — Other Ambulatory Visit: Payer: Self-pay | Admitting: Internal Medicine

## 2019-08-08 ENCOUNTER — Ambulatory Visit (INDEPENDENT_AMBULATORY_CARE_PROVIDER_SITE_OTHER): Payer: Medicare Other | Admitting: Family Medicine

## 2019-08-08 ENCOUNTER — Other Ambulatory Visit: Payer: Self-pay

## 2019-08-08 ENCOUNTER — Encounter: Payer: Medicare Other | Admitting: Family Medicine

## 2019-08-08 ENCOUNTER — Encounter: Payer: Self-pay | Admitting: Family Medicine

## 2019-08-08 VITALS — BP 130/80 | HR 64 | Temp 98.2°F | Ht 63.0 in | Wt 153.2 lb

## 2019-08-08 DIAGNOSIS — Z0000 Encounter for general adult medical examination without abnormal findings: Secondary | ICD-10-CM

## 2019-08-08 DIAGNOSIS — N1831 Chronic kidney disease, stage 3a: Secondary | ICD-10-CM | POA: Diagnosis not present

## 2019-08-08 DIAGNOSIS — I10 Essential (primary) hypertension: Secondary | ICD-10-CM | POA: Diagnosis not present

## 2019-08-08 DIAGNOSIS — I480 Paroxysmal atrial fibrillation: Secondary | ICD-10-CM

## 2019-08-08 DIAGNOSIS — D508 Other iron deficiency anemias: Secondary | ICD-10-CM

## 2019-08-08 DIAGNOSIS — E782 Mixed hyperlipidemia: Secondary | ICD-10-CM

## 2019-08-08 NOTE — Assessment & Plan Note (Signed)
Well controlled. Continue current medication.  

## 2019-08-08 NOTE — Progress Notes (Signed)
Chief Complaint  Patient presents with  . Medicare Wellness    History of Present Illness: HPI The patient presents for annual medicare wellness, complete physical and review of chronic health problems. He/She also has the following acute concerns today:none  I have personally reviewed the Medicare Annual Wellness questionnaire and have noted 1. The patient's medical and social history 2. Their use of alcohol, tobacco or illicit drugs 3. Their current medications and supplements 4. The patient's functional ability including ADL's, fall risks, home safety risks and hearing or visual             impairment. 5. Diet and physical activities 6. Evidence for depression or mood disorders 7.         Updated provider list Cognitive evaluation was performed and recorded on pt medicare questionnaire form. The patients weight, height, BMI and visual acuity have been recorded in the chart  I have made referrals, counseling and provided education to the patient based review of the above and I have provided the pt with a written personalized care plan for preventive services.   Documentation of this information was scanned into the electronic record under the media tab.   Advance directives and end of life planning reviewed in detail with patient and documented in EMR. Patient given handout on advance care directives if needed. HCPOA and living will updated if needed.   Hearing Screening   Method: Audiometry   125Hz  250Hz  500Hz  1000Hz  2000Hz  3000Hz  4000Hz  6000Hz  8000Hz   Right ear:   40 40 20  20    Left ear:   40 0 20  25    Vision Screening Comments: Eye Exam at [facility]  08/08/2019 07/16/2018 07/12/2017 03/10/2016 05/11/2015  Falls in the past year? 0 No No No No      Office Visit from 08/08/2019 in [place] at [facility] Total Score  0     PAF: rate controlled and followed by [name].  On eliquis for anticoag.  Vavlular heart disease and  CAD.  CKD Estimated Creatinine Clearance: 36.4 mL/min (A) (by C-G formula based on SCr of 1.21 mg/dL (H)).   Elevated Cholesterol: LDL not at goal on no med. Lab Results  Component Value Date   CHOL 223 (H) 07/25/2019   HDL 76.40 07/25/2019   LDLCALC 135 (H) 07/25/2019   LDLDIRECT 104.4 11/26/2012   TRIG 57.0 07/25/2019   CHOLHDL 3 07/25/2019  Using medications without problems: Muscle aches:  Diet compliance: moderate Exercise:occ Other complaints:  Hypertension:  At goal  on HCTZ and losartan BP Readings from Last 3 Encounters:  08/08/19 130/80  07/17/19 (!) 152/80  05/21/19 130/90  Using medication without problems or lightheadedness: none Chest pain with exertion:none Edema:none Short of breath:none Average home BPs: At goal Other issues:  Patient Care Team: [name], MD as PCP - General End, Harrell Gave, MD as PCP - Cardiology (Cardiology) Birder Robson, MD as Referring Physician (Ophthalmology)   COVID 19 screen No recent travel or known exposure to [place] The patient denies respiratory symptoms of COVID 19 at this time.  The importance of social distancing was discussed today.   Review of Systems  Constitutional: Negative for chills and fever.  HENT: Negative for congestion and ear pain.   Eyes: Negative for pain and redness.  Respiratory: Negative for cough and shortness of breath.   Cardiovascular: Negative for chest pain, palpitations and leg swelling.  Gastrointestinal: Negative for abdominal pain,  blood in stool, constipation, diarrhea, nausea and vomiting.  Genitourinary: Negative for dysuria.  Musculoskeletal: Negative for falls and myalgias.  Skin: Negative for rash.  Neurological: Negative for dizziness.  Psychiatric/Behavioral: Negative for depression. The patient is not nervous/anxious.       Past Medical History:  Diagnosis Date  . Anemia   . Aortic insufficiency    a. echo 11/11: EF 55-60%, mod LVH, mild AS (mean 12), mild AI,  mild MR, mild LAE; b. 09/2017 Echo: EF 60-65%, no rwma, mild AS/AI, mod MR. mildly dil LA. PASP 6mmHg.  Marland Kitchen Coronary artery disease, non-occlusive    a. cath 2/09: no CAD, EF 70%  . HYPERLIPIDEMIA   . HYPERTENSION   . OSTEOPENIA   . PAF (paroxysmal atrial fibrillation) ([place])    a. s/p ablation 2013; b. CHADS2VASc -> 4 (HTN, age x 2, female)-->Eliquis.  . Pneumonia 2009  . RA (rheumatoid arthritis) ([place])   . SINUS BRADYCARDIA    a. asymptomatic   . Unspecified glaucoma(365.9)     reports that she has never smoked. She has never used smokeless tobacco. She reports that she does not drink alcohol or use drugs.   Current Outpatient Medications:  .  apixaban (ELIQUIS) 5 MG TABS tablet, Take 1 tablet (5 mg total) by mouth 2 (two) times daily., Disp: 180 tablet, Rfl: 0 .  B Complex Vitamins (VITAMIN B COMPLEX) TABS, Take 1 tablet by mouth daily. , Disp: , Rfl:  .  Calcium Carb-Cholecalciferol (CALCIUM 1000 + D) 1000-800 MG-UNIT TABS, Take 1 tablet by mouth daily., Disp: , Rfl:  .  clotrimazole (LOTRIMIN) 1 % cream, APPLY TO AFFECTED AREAS TWICE DAILY, Disp: 42.5 g, Rfl: 0 .  hydrochlorothiazide (HYDRODIURIL) 25 MG tablet, Take 0.5 tablets (12.5 mg total) by mouth daily., Disp: 30 tablet, Rfl: 4 .  losartan (COZAAR) 25 MG tablet, Take 1 tablet (25 mg total) by mouth daily., Disp: 90 tablet, Rfl: 3   Observations/Objective: Blood pressure 130/80, pulse 64, temperature 98.2 F (36.8 C), temperature source Temporal, height 5\' 3"  (1.6 m), weight 153 lb 4 oz (69.5 kg), SpO2 97 %.  Physical Exam Constitutional:      General: She is not in acute distress.    Appearance: Normal appearance. She is well-developed. She is not ill-appearing or toxic-appearing.  HENT:     Head: Normocephalic.     Right Ear: Hearing, tympanic membrane, ear canal and external ear normal.     Left Ear: Hearing, tympanic membrane, ear canal and external ear normal.     Nose: Nose normal.  Eyes:     General: Lids are  normal. Lids are everted, no foreign bodies appreciated.     Conjunctiva/sclera: Conjunctivae normal.     Pupils: Pupils are equal, round, and reactive to light.  Neck:     Thyroid: No thyroid mass or thyromegaly.     Vascular: No carotid bruit.     Trachea: Trachea normal.  Cardiovascular:     Rate and Rhythm: Normal rate and regular rhythm.     Heart sounds: Normal heart sounds, S1 normal and S2 normal. No murmur. No gallop.   Pulmonary:     Effort: Pulmonary effort is normal. No respiratory distress.     Breath sounds: Normal breath sounds. No wheezing, rhonchi or rales.  Abdominal:     General: Bowel sounds are normal. There is no distension or abdominal bruit.     Palpations: Abdomen is soft. There is no fluid wave or mass.  Tenderness: There is no abdominal tenderness. There is no guarding or rebound.     Hernia: No hernia is present.  Musculoskeletal:     Cervical back: Normal range of motion and neck supple.  Lymphadenopathy:     Cervical: No cervical adenopathy.  Skin:    General: Skin is warm and dry.     Findings: No rash.  Neurological:     Mental Status: She is alert.     Cranial Nerves: No cranial nerve deficit.     Sensory: No sensory deficit.  Psychiatric:        Mood and Affect: Mood is not anxious or depressed.        Speech: Speech normal.        Behavior: Behavior normal. Behavior is cooperative.        Judgment: Judgment normal.      Assessment and Plan The patient's preventative maintenance and recommended screening tests for an annual wellness exam were reviewed in full today. Brought up to date unless services declined.  Counselled on the importance of diet, exercise, and its role in overall health and mortality. The patient's FH and SH was reviewed, including their home life, tobacco status, and drug and alcohol status.    Osteopenia: last DEXA 2016, stable, osteopenia repeat planned in 5 years  Colonoscopy: 2009 Dr.Jacobs, no further  indicated. Partial Hysterectomy: no pap indicated, no DVE indicated. No family or personal history of ovarian cancer, asymptomatic.  Vaccines: Uptodate,flu given  at  Cardiology OV,Postopned zostavax, tdap. Mammogram:03/2017 nml.. due Nonsmoker No ETOH, no drugs      Eliezer Lofts, MD

## 2019-08-08 NOTE — Patient Instructions (Addendum)
Get  Back track with exercise and healthy eating.  Start red yeast rice 600 mg 2 capsule twice daily.  Can return in 3-6 month for lab only check cholesterol.  Call to set up mammogram.

## 2019-08-08 NOTE — Assessment & Plan Note (Signed)
rate controlled and followed by [name].  On eliquis for anticoag.

## 2019-09-11 DIAGNOSIS — H401131 Primary open-angle glaucoma, bilateral, mild stage: Secondary | ICD-10-CM | POA: Diagnosis not present

## 2019-09-12 ENCOUNTER — Telehealth: Payer: Self-pay | Admitting: Internal Medicine

## 2019-09-12 NOTE — Telephone Encounter (Signed)
Patient calling  States she had two shots at last appointment Would like to clarify what they were  Please call to discuss

## 2019-09-12 NOTE — Telephone Encounter (Signed)
Called patient. I looked back over patient's AVS and it said the flu shot was given to her. However, it was not charted. I had the daily sheet from clinic that day with the sticker of the flu vaccine I gave to patient. Advised patient of this and I let her know I have now charted the vaccine as given in Epic. She was appreciative.

## 2019-09-13 ENCOUNTER — Other Ambulatory Visit: Payer: Self-pay

## 2019-09-13 ENCOUNTER — Other Ambulatory Visit
Admission: RE | Admit: 2019-09-13 | Discharge: 2019-09-13 | Disposition: A | Discharge status: Home / Self Care | Payer: Medicare Other | Source: Ambulatory Visit | Attending: Internal Medicine | Admitting: Internal Medicine

## 2019-09-13 DIAGNOSIS — I10 Essential (primary) hypertension: Secondary | ICD-10-CM | POA: Insufficient documentation

## 2019-09-13 DIAGNOSIS — Z79899 Other long term (current) drug therapy: Secondary | ICD-10-CM | POA: Diagnosis not present

## 2019-09-13 LAB — BASIC METABOLIC PANEL
Anion gap: 11 (ref 5–15)
BUN: 29 mg/dL — ABNORMAL HIGH (ref 8–23)
CO2: 24 mmol/L (ref 22–32)
Calcium: 9.4 mg/dL (ref 8.9–10.3)
Chloride: 105 mmol/L (ref 98–111)
Creatinine, Ser: 1.2 mg/dL — ABNORMAL HIGH (ref 0.44–1.00)
GFR calc Af Amer: 50 mL/min — ABNORMAL LOW (ref >60)
GFR calc non Af Amer: 44 mL/min — ABNORMAL LOW (ref >60)
Glucose, Bld: 87 mg/dL (ref 70–99)
Potassium: 4 mmol/L (ref 3.5–5.1)
Sodium: 140 mmol/L (ref 135–145)

## 2019-09-30 NOTE — Assessment & Plan Note (Signed)
Stable control. 

## 2019-09-30 NOTE — Assessment & Plan Note (Signed)
LDL not at goal. Get  Back track with exercise and healthy eating.  Start red yeast rice 600 mg 2 capsule twice daily.  Can return in 3-6 month for lab only check cholesterol.

## 2019-09-30 NOTE — Assessment & Plan Note (Signed)
Stable

## 2019-10-03 ENCOUNTER — Ambulatory Visit: Payer: Medicare Other | Attending: Internal Medicine

## 2019-10-03 DIAGNOSIS — Z20822 Contact with and (suspected) exposure to covid-19: Secondary | ICD-10-CM

## 2019-10-05 LAB — NOVEL CORONAVIRUS, NAA: SARS-CoV-2, NAA: NOT DETECTED

## 2019-10-06 ENCOUNTER — Telehealth: Payer: Self-pay | Admitting: Family Medicine

## 2019-10-06 NOTE — Telephone Encounter (Signed)
Patient is calling is to receive her negative COVID test results. Patient expressed understanding.

## 2019-10-09 ENCOUNTER — Other Ambulatory Visit: Payer: Self-pay | Admitting: Cardiovascular Disease

## 2019-10-09 NOTE — Telephone Encounter (Signed)
Refill Request.  

## 2019-10-09 NOTE — Telephone Encounter (Signed)
Eliquis 5mg  refill request received, pt is 78yrs old, weight-69.5kg, Crea-1.20 on 09/13/2019, Diagnosis-Afib, and last seen by [name] on 07/15/2019. Dose is appropriate based on dosing criteria. Will send in refill to requested pharmacy.

## 2019-10-17 ENCOUNTER — Ambulatory Visit (INDEPENDENT_AMBULATORY_CARE_PROVIDER_SITE_OTHER): Payer: Medicare Other | Admitting: Internal Medicine

## 2019-10-17 ENCOUNTER — Encounter: Payer: Self-pay | Admitting: Internal Medicine

## 2019-10-17 ENCOUNTER — Other Ambulatory Visit: Payer: Self-pay

## 2019-10-17 VITALS — BP 160/80 | HR 51 | Ht 63.0 in | Wt 152.0 lb

## 2019-10-17 DIAGNOSIS — I10 Essential (primary) hypertension: Secondary | ICD-10-CM

## 2019-10-17 DIAGNOSIS — Z79899 Other long term (current) drug therapy: Secondary | ICD-10-CM

## 2019-10-17 MED ORDER — LOSARTAN POTASSIUM 50 MG PO TABS: 50.0000 mg | ORAL_TABLET | Freq: Every day | ORAL | 3 refills | Status: DC

## 2019-10-17 NOTE — Progress Notes (Signed)
Follow-up Outpatient Visit Date: 10/17/2019  Primary Care Provider: [name], MD [place] [address]  Chief Complaint: Follow-up PAF and hypertension  HPI:  [name] is a 79 y.o. female with history of paroxysmal atrial fibrillation status post ablation, asymptomatic bradycardia, mitral and aortic regurgitation, chronic kidney disease stage II, hypertension, and hyperlipidemia, who presents for follow-up of leg edema and paroxysmal atrial fibrillation.  I last saw her in early October, at which time she reported improvement in the leg swelling and energy after HCTZ was added at our prior visit this summer.  Blood pressure was somewhat variable but typically still elevated 52/80 at our last visit).  Labs at that visit were notable for mild worsening of renal function.  We therefore decreased HCTZ to 12.5 mg daily with normalization of creatinine.  Losartan 25 mg daily was subsequently added with stable creatinine and potassium on follow-up BMP.  Blood pressure was under better control when she saw Dr. Diona Browner in late October.  Today, [name] reports feeling well.  She denies chest pain, shortness of breath, palpitations, lightheadedness, dizziness, and edema.  Home blood pressures are usually around 160/65.  She is tolerating her medications well without side effects.  She admits to eating out from time to time and thinks that her blood pressure might be a little elevated due to increased sodium intake related to this.  --------------------------------------------------------------------------------------------------  Past Medical History:  Diagnosis Date  . Anemia   . Aortic insufficiency    a. echo 11/11: EF 55-60%, mod LVH, mild AS (mean 12), mild AI, mild MR, mild LAE; b. 09/2017 Echo: EF 60-65%, no rwma, mild AS/AI, mod MR. mildly dil LA. PASP 27mmHg.  Marland Kitchen Coronary artery disease, non-occlusive    a. cath 2/09: no CAD, EF 70%  . HYPERLIPIDEMIA   .  HYPERTENSION   . OSTEOPENIA   . PAF (paroxysmal atrial fibrillation) ([place])    a. s/p ablation 2013; b. CHADS2VASc -> 4 (HTN, age x 2, female)-->Eliquis.  . Pneumonia 2009  . RA (rheumatoid arthritis) ([place])   . SINUS BRADYCARDIA    a. asymptomatic   . Unspecified glaucoma(365.9)    Past Surgical History:  Procedure Laterality Date  . ABLATION OF DYSRHYTHMIC FOCUS    . CARDIAC CATHETERIZATION    . COLONOSCOPY  04/18/08  . Partial hysterectomy--1979    . Thoracentesis   12/20/07      Current Meds  Medication Sig  . B Complex Vitamins (VITAMIN B COMPLEX) TABS Take 1 tablet by mouth daily.   . Calcium Carb-Cholecalciferol (CALCIUM 1000 + D) 1000-800 MG-UNIT TABS Take 1 tablet by mouth daily.  . clotrimazole (LOTRIMIN) 1 % cream APPLY TO AFFECTED AREAS TWICE DAILY  . ELIQUIS 5 MG TABS tablet TAKE ONE TABLET BY MOUTH TWICE DAILY  . [DISCONTINUED] losartan (COZAAR) 25 MG tablet Take 1 tablet (25 mg total) by mouth daily.    Allergies: Amlodipine, Celecoxib, and Tramadol  Social History   Tobacco Use  . Smoking status: Never Smoker  . Smokeless tobacco: Never Used  Substance Use Topics  . Alcohol use: No    Alcohol/week: 0.0 standard drinks  . Drug use: No    Family History  Problem Relation Age of Onset  . Diabetes Other   . Breast cancer Paternal Aunt 32  . Heart disease Mother   . Pancreatic cancer Father   . Atrial fibrillation Brother     Review of Systems: A 12-system review of systems  was performed and was negative except as noted in the HPI.  --------------------------------------------------------------------------------------------------  Physical Exam: BP (!) 160/80 (BP Location: Left Arm, Patient Position: Sitting, Cuff Size: Normal)   Pulse (!) 51   Ht 5\' 3"  (1.6 m)   Wt 152 lb (68.9 kg)   BMI 26.93 kg/m   General: NAD. HEENT: No conjunctival pallor or scleral icterus. Facemask in place. Neck: Supple without lymphadenopathy, thyromegaly, JVD, or  HJR. Lungs: Normal work of breathing. Clear to auscultation bilaterally without wheezes or crackles. Heart: Bradycardic but regular with 1/6 systolic murmur. Non-displaced PMI. Abd: Bowel sounds present. Soft, NT/ND without hepatosplenomegaly Ext: No lower extremity edema. Radial, PT, and DP pulses are 2+ bilaterally. Skin: Warm and dry without rash.  EKG: Sinus bradycardia (heart rate 51 bpm) with poor R wave progression in V1 and V2.  Lab Results  Component Value Date   WBC 5.9 07/25/2019   HGB 11.9 (L) 07/25/2019   HCT 35.8 (L) 07/25/2019   MCV 93.0 07/25/2019   PLT 225.0 07/25/2019    Lab Results  Component Value Date   NA 140 09/13/2019   K 4.0 09/13/2019   CL 105 09/13/2019   CO2 24 09/13/2019   BUN 29 (H) 09/13/2019   CREATININE 1.20 (H) 09/13/2019   GLUCOSE 87 09/13/2019   ALT 11 07/25/2019    Lab Results  Component Value Date   CHOL 223 (H) 07/25/2019   HDL 76.40 07/25/2019   LDLCALC 135 (H) 07/25/2019   LDLDIRECT 104.4 11/26/2012   TRIG 57.0 07/25/2019   CHOLHDL 3 07/25/2019    --------------------------------------------------------------------------------------------------  ASSESSMENT AND PLAN: Hypertension: Blood pressure suboptimally controlled today. She is tolerating current regimen of HCTZ and losartan well. We will therefore increase losartan to 50 mg daily with BMP in 2 weeks. Importance of sodium restriction was reinforced.  Paroxysmal atrial fibrillation: No palpitations noted. Sinus bradycardia again seen on EKG today. Continue apixaban given a CHA2DS2-VASc score at least 3.  Sinus bradycardia: Asymptomatic. Avoid rate lowering agents such as nondihydropyridine calcium channel blockers and beta-blockers.  Valvular heart disease: Mild to moderate aortic and mitral regurgitation previously noted. Mild aortic stenosis also commented on 2018, though echo from 05/2019 did not show elevated aortic valve gradient. Continue clinical follow-up, as the  patient is without signs or symptoms of progressive heart failure.  Follow-up: Return to clinic in 3 to 4 weeks to reassess blood pressure with APP.  Nelva Bush, MD 10/17/2019 1:45 PM

## 2019-10-17 NOTE — Patient Instructions (Signed)
Medication Instructions:  Your physician has recommended you make the following change in your medication:  1- INCREASE Losartan 50 mg by mouth once a day.  *If you need a refill on your cardiac medications before your next appointment, please call your pharmacy*  Lab Work: Your physician recommends that you return for lab work in: 2 weeks on 10/31/19 at the [facility] ([facility]). - Please go to the [facility] You will check in at the front desk to the right as you walk into the atrium. Valet Parking is offered if needed. - No appointment needed. You may go any day between 7 am and 6 pm.   If you have labs (blood work) drawn today and your tests are completely normal, you will receive your results only by: Marland Kitchen MyChart Message (if you have MyChart) OR . A paper copy in the mail If you have any lab test that is abnormal or we need to change your treatment, we will call you to review the results.  Testing/Procedures: none  Follow-Up: At [facility], you and your health needs are our priority.  As part of our continuing mission to provide you with exceptional heart care, we have created designated Provider Care Teams.  These Care Teams include your primary Cardiologist (physician) and Advanced Practice Providers (APPs -  Physician Assistants and Nurse Practitioners) who all work together to provide you with the care you need, when you need it.  Your next appointment:   3-4 week(s) with APP  The format for your next appointment:   In Person  Provider:    You may see Nelva Bush, MD or one of the following Advanced Practice Providers on your designated Care Team:    Murray Hodgkins, NP  Christell Faith, PA-C  Marrianne Mood, PA-C

## 2019-10-19 ENCOUNTER — Encounter: Payer: Self-pay | Admitting: Internal Medicine

## 2019-10-25 ENCOUNTER — Other Ambulatory Visit: Payer: Self-pay

## 2019-10-25 ENCOUNTER — Ambulatory Visit (INDEPENDENT_AMBULATORY_CARE_PROVIDER_SITE_OTHER): Payer: Medicare Other | Admitting: Family Medicine

## 2019-10-25 ENCOUNTER — Encounter: Payer: Self-pay | Admitting: Family Medicine

## 2019-10-25 VITALS — BP 140/70 | HR 58 | Temp 97.9°F | Ht 63.0 in | Wt 153.5 lb

## 2019-10-25 DIAGNOSIS — R151 Fecal smearing: Secondary | ICD-10-CM

## 2019-10-25 NOTE — Progress Notes (Signed)
Chief Complaint  Patient presents with  . Drainage from Rectum    x 1 month    History of Present Illness: HPI  79 year old female presents with new onset rectal leakage x 2-3 months. Noted yellowish discharge from rectum. Mucusy substance.  She is having regular BMs .. usually after eating. Occ diarrhea, mpoister stool, soft.  No constipation,  occ abdominal pain prior to BM.  no ractal pain.   No blood in stool.   She has been drinking juice that sister in law makes x 2 months. Mixture of fruit drinks.  Colonoscopy: normal in 2009    Was on antibiotics 05/2019   This visit occurred during the SARS-CoV-2 public health emergency.  Safety protocols were in place, including screening questions prior to the visit, additional usage of staff PPE, and extensive cleaning of exam room while observing appropriate contact time as indicated for disinfecting solutions.   COVID 19 screen:  No recent travel or known exposure to COVID19 The patient denies respiratory symptoms of COVID 19 at this time. The importance of social distancing was discussed today.     Review of Systems  Constitutional: Negative for chills and fever.  HENT: Negative for congestion and ear pain.   Eyes: Negative for pain and redness.  Respiratory: Negative for cough and shortness of breath.   Cardiovascular: Negative for chest pain, palpitations and leg swelling.  Gastrointestinal: Negative for abdominal pain, blood in stool, constipation, diarrhea, nausea and vomiting.  Genitourinary: Negative for dysuria.  Musculoskeletal: Negative for falls and myalgias.  Skin: Negative for rash.  Neurological: Negative for dizziness.  Psychiatric/Behavioral: Negative for depression. The patient is not nervous/anxious.       Past Medical History:  Diagnosis Date  . Anemia   . Aortic insufficiency    a. echo 11/11: EF 55-60%, mod LVH, mild AS (mean 12), mild AI, mild MR, mild LAE; b. 09/2017 Echo: EF 60-65%, no rwma,  mild AS/AI, mod MR. mildly dil LA. PASP 21mmHg.  Marland Kitchen Coronary artery disease, non-occlusive    a. cath 2/09: no CAD, EF 70%  . HYPERLIPIDEMIA   . HYPERTENSION   . OSTEOPENIA   . PAF (paroxysmal atrial fibrillation) ([place])    a. s/p ablation 2013; b. CHADS2VASc -> 4 (HTN, age x 2, female)-->Eliquis.  . Pneumonia 2009  . RA (rheumatoid arthritis) ([place])   . SINUS BRADYCARDIA    a. asymptomatic   . Unspecified glaucoma(365.9)     reports that she has never smoked. She has never used smokeless tobacco. She reports that she does not drink alcohol or use drugs.   Current Outpatient Medications:  .  B Complex Vitamins (VITAMIN B COMPLEX) TABS, Take 1 tablet by mouth daily. , Disp: , Rfl:  .  Calcium Carb-Cholecalciferol (CALCIUM 1000 + D) 1000-800 MG-UNIT TABS, Take 1 tablet by mouth daily., Disp: , Rfl:  .  clotrimazole (LOTRIMIN) 1 % cream, APPLY TO AFFECTED AREAS TWICE DAILY, Disp: 42.5 g, Rfl: 0 .  ELIQUIS 5 MG TABS tablet, TAKE ONE TABLET BY MOUTH TWICE DAILY, Disp: 180 tablet, Rfl: 2 .  losartan (COZAAR) 50 MG tablet, Take 1 tablet (50 mg total) by mouth daily., Disp: 90 tablet, Rfl: 3 .  hydrochlorothiazide (HYDRODIURIL) 25 MG tablet, Take 0.5 tablets (12.5 mg total) by mouth daily., Disp: 30 tablet, Rfl: 4   Observations/Objective: Blood pressure 140/70, pulse (!) 58, temperature 97.9 F (36.6 C), temperature source Temporal, height 5\' 3"  (1.6 m), weight 153 lb 8 oz (69.6  kg), SpO2 98 %.  Physical Exam Constitutional:      General: She is not in acute distress.    Appearance: Normal appearance. She is well-developed. She is not ill-appearing or toxic-appearing.  HENT:     Head: Normocephalic.     Right Ear: Hearing, tympanic membrane, ear canal and external ear normal. Tympanic membrane is not erythematous, retracted or bulging.     Left Ear: Hearing, tympanic membrane, ear canal and external ear normal. Tympanic membrane is not erythematous, retracted or bulging.     Nose: No  mucosal edema or rhinorrhea.     Right Sinus: No maxillary sinus tenderness or frontal sinus tenderness.     Left Sinus: No maxillary sinus tenderness or frontal sinus tenderness.     Mouth/Throat:     Pharynx: Uvula midline.  Eyes:     General: Lids are normal. Lids are everted, no foreign bodies appreciated.     Conjunctiva/sclera: Conjunctivae normal.     Pupils: Pupils are equal, round, and reactive to light.  Neck:     Thyroid: No thyroid mass or thyromegaly.     Vascular: No carotid bruit.     Trachea: Trachea normal.  Cardiovascular:     Rate and Rhythm: Normal rate and regular rhythm.     Pulses: Normal pulses.     Heart sounds: Normal heart sounds, S1 normal and S2 normal. No murmur. No friction rub. No gallop.   Pulmonary:     Effort: Pulmonary effort is normal. No tachypnea or respiratory distress.     Breath sounds: Normal breath sounds. No decreased breath sounds, wheezing, rhonchi or rales.  Abdominal:     General: Bowel sounds are normal.     Palpations: Abdomen is soft.     Tenderness: There is no abdominal tenderness.  Genitourinary:    Rectum: Normal. Guaiac result negative. No mass, tenderness, anal fissure, external hemorrhoid or internal hemorrhoid. Normal anal tone.  Musculoskeletal:     Cervical back: Normal range of motion and neck supple.  Skin:    General: Skin is warm and dry.     Findings: No rash.  Neurological:     Mental Status: She is alert.  Psychiatric:        Mood and Affect: Mood is not anxious or depressed.        Speech: Speech normal.        Behavior: Behavior normal. Behavior is cooperative.        Thought Content: Thought content normal.        Judgment: Judgment normal.      Assessment and Plan   Fecal smearing Nml rectal tone and sphincter strength.  Most likely this issue is from softer stools.  Stop fruit juice. Increase water.  try probiotic. If not improving in next 2-3 weeks.. follow up for further  evaluation.      Eliezer Lofts, MD

## 2019-10-25 NOTE — Patient Instructions (Signed)
Stop fruit juice. Increase water.  try probiotic. If not improving in next 2-3 weeks.. follow up for further evaluation.

## 2019-10-31 ENCOUNTER — Other Ambulatory Visit
Admission: RE | Admit: 2019-10-31 | Discharge: 2019-10-31 | Disposition: A | Discharge status: Home / Self Care | Payer: Medicare Other | Source: Ambulatory Visit | Attending: Internal Medicine | Admitting: Internal Medicine

## 2019-10-31 DIAGNOSIS — I10 Essential (primary) hypertension: Secondary | ICD-10-CM | POA: Insufficient documentation

## 2019-10-31 DIAGNOSIS — Z79899 Other long term (current) drug therapy: Secondary | ICD-10-CM | POA: Insufficient documentation

## 2019-10-31 LAB — BASIC METABOLIC PANEL WITH GFR
Anion gap: 9 (ref 5–15)
BUN: 26 mg/dL — ABNORMAL HIGH (ref 8–23)
CO2: 27 mmol/L (ref 22–32)
Calcium: 9.6 mg/dL (ref 8.9–10.3)
Chloride: 107 mmol/L (ref 98–111)
Creatinine, Ser: 1.19 mg/dL — ABNORMAL HIGH (ref 0.44–1.00)
GFR calc Af Amer: 51 mL/min — ABNORMAL LOW
GFR calc non Af Amer: 44 mL/min — ABNORMAL LOW
Glucose, Bld: 98 mg/dL (ref 70–99)
Potassium: 4.3 mmol/L (ref 3.5–5.1)
Sodium: 143 mmol/L (ref 135–145)

## 2019-11-01 ENCOUNTER — Telehealth: Payer: Self-pay

## 2019-11-01 NOTE — Telephone Encounter (Signed)
Spoke with [name] and gave her the lab results with Dr. Marisue Humble recommendations. Pt verbalized understanding and did not have any questions.

## 2019-11-01 NOTE — Telephone Encounter (Signed)
-----   Message from Nelva Bush, MD sent at 11/01/2019 11:00 AM EST ----- Please let [name] know that her kidney function and electrolytes are stable.  She should continue her current medications and follow-up as previously discussed.

## 2019-11-08 ENCOUNTER — Other Ambulatory Visit: Payer: Self-pay

## 2019-11-08 ENCOUNTER — Encounter: Payer: Self-pay | Admitting: Physician Assistant

## 2019-11-08 ENCOUNTER — Ambulatory Visit (INDEPENDENT_AMBULATORY_CARE_PROVIDER_SITE_OTHER): Payer: Medicare Other | Admitting: Physician Assistant

## 2019-11-08 VITALS — BP 160/82 | HR 49 | Ht 63.0 in | Wt 152.0 lb

## 2019-11-08 DIAGNOSIS — Z9889 Other specified postprocedural states: Secondary | ICD-10-CM

## 2019-11-08 DIAGNOSIS — Z8679 Personal history of other diseases of the circulatory system: Secondary | ICD-10-CM

## 2019-11-08 DIAGNOSIS — R001 Bradycardia, unspecified: Secondary | ICD-10-CM

## 2019-11-08 DIAGNOSIS — I38 Endocarditis, valve unspecified: Secondary | ICD-10-CM

## 2019-11-08 DIAGNOSIS — I061 Rheumatic aortic insufficiency: Secondary | ICD-10-CM

## 2019-11-08 DIAGNOSIS — E785 Hyperlipidemia, unspecified: Secondary | ICD-10-CM

## 2019-11-08 DIAGNOSIS — I495 Sick sinus syndrome: Secondary | ICD-10-CM | POA: Diagnosis not present

## 2019-11-08 DIAGNOSIS — I480 Paroxysmal atrial fibrillation: Secondary | ICD-10-CM

## 2019-11-08 DIAGNOSIS — I350 Nonrheumatic aortic (valve) stenosis: Secondary | ICD-10-CM | POA: Diagnosis not present

## 2019-11-08 DIAGNOSIS — Z87898 Personal history of other specified conditions: Secondary | ICD-10-CM | POA: Diagnosis not present

## 2019-11-08 DIAGNOSIS — R000 Tachycardia, unspecified: Secondary | ICD-10-CM

## 2019-11-08 DIAGNOSIS — R0989 Other specified symptoms and signs involving the circulatory and respiratory systems: Secondary | ICD-10-CM

## 2019-11-08 DIAGNOSIS — I2510 Atherosclerotic heart disease of native coronary artery without angina pectoris: Secondary | ICD-10-CM

## 2019-11-08 DIAGNOSIS — Z5309 Procedure and treatment not carried out because of other contraindication: Secondary | ICD-10-CM

## 2019-11-08 NOTE — Progress Notes (Signed)
Office Visit    Patient Name: [name] Date of Encounter: 11/08/2019  Primary Care Provider:  [name], MD Primary Cardiologist:  Nelva Bush, MD  Chief Complaint    79 year old female with history of PAF s/p ablation, paroxysmal SVT, asymptomatic bradycardia, intolerance to amlodipine, mitral and aortic regurgitation, CKD 2, hypertension, and hyperlipidemia, and who presents today for 3 week follow-up of LEE and PAF.    Past Medical History    Past Medical History:  Diagnosis Date  . Anemia   . Aortic insufficiency    a. echo 11/11: EF 55-60%, mod LVH, mild AS (mean 12), mild AI, mild MR, mild LAE; b. 09/2017 Echo: EF 60-65%, no rwma, mild AS/AI, mod MR. mildly dil LA. PASP 41mmHg.  Marland Kitchen Coronary artery disease, non-occlusive    a. cath 2/09: no CAD, EF 70%  . HYPERLIPIDEMIA   . HYPERTENSION   . OSTEOPENIA   . PAF (paroxysmal atrial fibrillation) ([place])    a. s/p ablation 2013; b. CHADS2VASc -> 4 (HTN, age x 2, female)-->Eliquis.  . Pneumonia 2009  . RA (rheumatoid arthritis) ([place])   . SINUS BRADYCARDIA    a. asymptomatic   . Unspecified glaucoma(365.9)    Past Surgical History:  Procedure Laterality Date  . ABLATION OF DYSRHYTHMIC FOCUS    . CARDIAC CATHETERIZATION    . COLONOSCOPY  04/18/08  . Partial hysterectomy--1979    . Thoracentesis   12/20/07      Allergies  Allergies  Allergen Reactions  . Amlodipine     Ankle swelling  . Celecoxib Other (See Comments)    Tachycardia/palpitations  . Tramadol Nausea Only    History of Present Illness   79 yo female with PMH as above and including recent visit to her primary cardiologist for lower extremity edema and PAF.  At that time, she was doing well from a cardiac standpoint.  She had been seen in early October 2020, at which time she reported improvement in lower extremity edema and energy after HCTZ was added over the summer.  BP was reportedly labile but typically elevated into the 150s.  Labs  were notable for increasing creatinine.  In this setting, HCTZ was decreased to 12.5 mg daily with normalization of creatinine.  Losartan 25 mg was subsequently added with stable creatinine and potassium on follow-up.  When seen by her PCP in late October 2020, BP was under better control.  At her last visit with her primary cardiologist 10/17/2019, she reported that she was feeling well.  Home BP was usually SBP 160 and DBP 65.  She was tolerating her medications well.  She admitted to eating out a lot at the time, attributing her elevated blood pressure to increase sodium intake in the setting.  Losartan was increased to 50 mg daily with BMP in 2 weeks.  She was continued on apixaban.  Nondihydropyridine CCB and beta blockers were noted to be avoided due to bradycardic rates. Of note, she also has noted intolerance to amlodipine 2/2 LEE.   Today, she reports that she is doing well with home pressures typically 140s/60s.  BP at clinic today 174/90 with patient reporting that her BP is likely elevated 2/2 rushing into the clinic. On recheck of BP, right upper extremity BP 160/82 and LUE BP 148/98 with suspicion 2/2 ectopy as outlined below. EKG shows sinus bradycardia today with patient exam significant for tachycardic rate and frequent extrasystole during cardiac auscultation. No report of recent LOC / syncope but  she does note intermittent racing HR, palpitations, and dizziness. No recent CP. She continues to note improved LEE. No orthopnea, abdominal distention, PND, or early satiety. She does admit to continued eating out at restaurants today, stating that she believes that she is getting more sodium than she should in this setting. Preference today is for diet control of BP over changes in medical management or further workup as outlined below. She reports medication compliance and denies s/sx consistent with acute bleeding. She does note a past yellow stool with previous echo noting possible liver cyst and  follow-up with PCP.   Home Medications    Prior to Admission medications   Medication Sig Start Date End Date Taking? Authorizing Provider  B Complex Vitamins (VITAMIN B COMPLEX) TABS Take 1 tablet by mouth daily.    Yes [provider]  Calcium Carb-Cholecalciferol (CALCIUM 1000 + D) 1000-800 MG-UNIT TABS Take 1 tablet by mouth daily.   Yes [provider]  clotrimazole (LOTRIMIN) 1 % cream APPLY TO AFFECTED AREAS TWICE DAILY 05/16/19  Yes Bedsole, Amy E, MD  ELIQUIS 5 MG TABS tablet TAKE ONE TABLET BY MOUTH TWICE DAILY 10/09/19  Yes End, Harrell Gave, MD  hydrochlorothiazide (HYDRODIURIL) 25 MG tablet Take 0.5 tablets (12.5 mg total) by mouth daily. 07/18/19 11/08/19 Yes End, Harrell Gave, MD  losartan (COZAAR) 50 MG tablet Take 1 tablet (50 mg total) by mouth daily. 10/17/19 01/15/20 Yes End, Harrell Gave, MD    Review of Systems    She denies chest pain, dyspnea, pnd, orthopnea, n, v, syncope, increased edema, weight gain, or early satiety. She reports occasional dizziness, racing HR, and palpitations.  She also notes a yellow stool. All other systems reviewed and are otherwise negative except as noted above.  Physical Exam    VS:  BP (!) 174/90 (BP Location: Right Arm, Patient Position: Sitting, Cuff Size: Normal)   Pulse (!) 49   Ht 5\' 3"  (1.6 m)   Wt 152 lb (68.9 kg)   SpO2 97%   BMI 26.93 kg/m  , BMI Body mass index is 26.93 kg/m. GEN: Well nourished, well developed, in no acute distress. HEENT: normal. Neck: Supple, no JVD, carotid bruits, or masses. Cardiac: tachycardic with frequent extrasystole appreciated despite bradycardia on EKG, 1/6 systolic murmur. No rubs or gallops. No clubbing, cyanosis, edema.  Radials/DP/PT 2+ and equal bilaterally.  Respiratory:  Respirations regular and unlabored, clear to auscultation bilaterally. GI: Soft, nontender, nondistended, BS + x 4. MS: no deformity or atrophy.  Skin: warm and dry, no rash. Neuro:  Strength and sensation  are intact. Psych: Normal affect.  Accessory Clinical Findings    ECG personally reviewed by me today - SB, 49 bpm, poor R wave progression / poor conduction in V1 and III, nonspecific ST/T changes in anterior leads- no acute changes.  Zio 04/2019  The patient was monitored for 13 days, 20 hours.  Predominant rhythm was sinus with an average rate of 61 bpm (range 37-109 bpm in sinus rhythm).  There were occasional PACs and rare PVCs.  21 atrial runs occurred, lasting up to 20 beats with a maximal rate of 169 bpm.  There was no sustained arrhythmia or prolonged pause.  There were no patient triggered events.  Predominantly sinus rhythm with occasional PACs and rare PVCs.  Multiple episodes of PSVT noted, lasting up to 20 beats (10.2 seconds).  Echo 05/2019 1. The left ventricle has normal systolic function with an ejection  fraction of 60-65%. The cavity size was normal.  There is mildly increased  left ventricular wall thickness. Left ventricular diastolic Doppler  parameters are consistent with  pseudonormalization.  2. The right ventricle has normal systolic function. The cavity was  normal. There is no increase in right ventricular wall thickness. Right  ventricular systolic pressure is mildly elevated with an estimated  pressure of 41.7 mmHg.  3. Left atrial size was moderately dilated.  4. Incompletely evaluated hypoechoic structure noted in the liver, most  likely a cyst.  5. The mitral valve is degenerative. Mild thickening of the mitral valve  leaflet. Mitral valve regurgitation is moderate by color flow Doppler.  6. Tricuspid valve regurgitation is moderate.  7. The aortic valve has an indeterminate number of cusps. Mild thickening  of the aortic valve. Mild calcification of the aortic valve. Aortic valve  regurgitation is mild by color flow Doppler.  8. The aorta is normal in size and structure.   Recent labs Na 143, K 4.3, Cr 1.19, BUN 26  A1C 5.9 TSH  01/2018 1.52 Hgb 11.9 HCT 35.8  Filed Weights   11/08/19 1050  Weight: 152 lb (68.9 kg)     Assessment & Plan    Hypertension --BP remains suboptimal today. Initial BP 174/90 with patient report that higher BP 2/2 walk into the office. Recheck at end of visit done in both upper extremities and 148/98 in L arm versus 160/82 in R arm. Considered is difference in BP readings 2/2 frequency of extrasystole / ectopy. Due to this, will defer further workup of difference in UE BP at this time and will recheck at home and on RTC then reassess need for further workup. Noted that EKG today SB without ectopy with exam notable for tachycardic rate and frequent extrasystole. Previous Zio as above. Recheck of BMET/Mg to ensure stable electrolytes, as well as check a TSH/FT4 today.She prefers to defer repeat EKG or Zio at this time. She also requests to first attempt dietary BP control over medication changes today.  DASH diet reviewed.  At follow-up, reassess diet and BP. If BP remains suboptimal at recheck, consider further increase of Losartan as tolerated by renal function and with consideration of rechecked BMET/Mg today. Past increase in HCTZ has not been well tolerated by renal function with need to reduce back down to HCTZ 12.5mg  as outlined above. Intolerant to amlodipine. Will also need to consider bradycardic rates. Defer further workup of asymmetric BP for now. Recommend consider further workup with RAS scan if unable to control BP with medical management at RTC.   Paroxysmal Atrial Fibrillation s/p Ablation PACs/PVCs Paroxysmal SVT --Reports intermittent racing HR and palpitations. No syncope or LOC but does note occasional dizziness. Despite EKG showing SB, frequent extrasystole and tachycardic rate appreciated on cardiac auscultation today. 04/2019 monitor completed for 3-7 days and showed predominant rhythm as SR with average HR 61 bpm, occasional PAC/PVCs, and 21 atrial runs with longest lasting up to  20 beats and maximal rate of 169 bpm. Given extrasystole and tachycardic rate on today's cardiac exam (in comparison to SB on EKG), as well as patient reporting intermittent racing HR / palpitations with occasional dizziness, suggested repeat EKG today or repeat Zio for 2 weeks to again quantify burden of atrial runs +/- repeat echo. Per patient preference, further workup deferred today. Reassess at follow-up. Continue Eliquis for long term [place] given CHA2DS2VASc score of at least  4 (HTN, age x2, female). Rate controlling agent with BB deferred in the setting of bradycardia on EKG.  Sinus bradycardia  --Asymptomatic. Continue to avoid nondihydropyridine CCB and BB.  She also is intolerant to amlodipine.   Valvular heart dz --Mild to moderate aortic and mitral regurgitation previously noted. Periodic echo recommended with suggestion for updated echo given her new sx deferred for now. Reassess symptoms and asymmetric BP at home as above.    HLD --Not on a statin. LDL not well controlled and 135 on last check. Patient prefers not to start a statin today. Consider recheck of lipids at RTC.  Liver cyst --As above, recommend follow-up per PCP. Reports previous yellow stool.   CKD   --Will recheck BMET/Mg today for reassessment of electroltyes given frequency of ectopy and tachycardic rate despite EKG with SB and no ectopy. Reassess renal function and consider RAS scan with worsening renal function / if unable to control BP with medical management.  ----  Disposition: Given extrasystole and tachycardic rate on cardiac exam when compared with SB on EKG, recheck renal function and electrolytes. Check TSH/T4. Defer medication changes and further workup with repeat EKG/Zio/Echo per patient preference. Consider RAS scan if unable to control BP with medical management in the future. Defer recheck of lipids per patient preference.   Arvil Chaco, PA-C 11/08/2019, 11:18 AM

## 2019-11-08 NOTE — Patient Instructions (Signed)
Medication Instructions:  fNo medication changes *If you need a refill on your cardiac medications before your next appointment, please call your pharmacy*  Lab Work: You had a BMET and Magnesium today  If you have labs (blood work) drawn today and your tests are completely normal, you will receive your results only by: Marland Kitchen MyChart Message (if you have MyChart) OR . A paper copy in the mail If you have any lab test that is abnormal or we need to change your treatment, we will call you to review the results.  Testing/Procedures: None ordered  Follow-Up: At [facility], you and your health needs are our priority.  As part of our continuing mission to provide you with exceptional heart care, we have created designated Provider Care Teams.  These Care Teams include your primary Cardiologist (physician) and Advanced Practice Providers (APPs -  Physician Assistants and Nurse Practitioners) who all work together to provide you with the care you need, when you need it.  Your next appointment:   2 week(s)  The format for your next appointment:   In Person  Provider:    You may see Nelva Bush, MD or one of the following Advanced Practice Providers on your designated Care Team:    Murray Hodgkins, NP  Christell Faith, PA-C  Marrianne Mood, PA-C   Other Instructions NA

## 2019-11-09 LAB — BASIC METABOLIC PANEL
BUN/Creatinine Ratio: 24 (ref 12–28)
BUN: 30 mg/dL — ABNORMAL HIGH (ref 8–27)
CO2: 25 mmol/L (ref 20–29)
Calcium: 10.2 mg/dL (ref 8.7–10.3)
Chloride: 103 mmol/L (ref 96–106)
Creatinine, Ser: 1.25 mg/dL — ABNORMAL HIGH (ref 0.57–1.00)
GFR calc Af Amer: 48 mL/min/{1.73_m2} — ABNORMAL LOW (ref >59)
GFR calc non Af Amer: 42 mL/min/{1.73_m2} — ABNORMAL LOW (ref >59)
Glucose: 88 mg/dL (ref 65–99)
Potassium: 4.4 mmol/L (ref 3.5–5.2)
Sodium: 141 mmol/L (ref 134–144)

## 2019-11-09 LAB — MAGNESIUM: Magnesium: 2.1 mg/dL (ref 1.6–2.3)

## 2019-11-11 ENCOUNTER — Telehealth: Payer: Self-pay

## 2019-11-11 NOTE — Telephone Encounter (Signed)
-----   Message from Arvil Chaco, PA-C sent at 11/11/2019  6:00 AM EST ----- Please let [name] know that her current electrolytes are stable. Specifically, her potassium was 4.4 (goal 4.0) and magnesium 2.1 (goal 2.0). This means her electrolytes are not contributing to her racing HR or palpitations, which is reassuring. Renal function was at her overall baseline with very mild increase in creatinine/ BUN likely 2/2 elevated BP and salt intake. Reduce salt and closely monitor BP with daily weights as discussed in clinic. Call the office if SBP consistently elevated above 160 despite dietary changes. Reassess at follow-up.

## 2019-11-11 NOTE — Telephone Encounter (Signed)
Call to patient to review labs. No further orders at this time.   Confirmed upcoming appt.   Advised pt to call for any further questions or concerns.

## 2019-11-22 ENCOUNTER — Ambulatory Visit (INDEPENDENT_AMBULATORY_CARE_PROVIDER_SITE_OTHER): Payer: Medicare Other | Admitting: Internal Medicine

## 2019-11-22 ENCOUNTER — Other Ambulatory Visit: Payer: Self-pay

## 2019-11-22 ENCOUNTER — Encounter: Payer: Self-pay | Admitting: Internal Medicine

## 2019-11-22 VITALS — BP 170/90 | HR 50 | Ht 63.0 in | Wt 153.2 lb

## 2019-11-22 DIAGNOSIS — R001 Bradycardia, unspecified: Secondary | ICD-10-CM | POA: Diagnosis not present

## 2019-11-22 DIAGNOSIS — I480 Paroxysmal atrial fibrillation: Secondary | ICD-10-CM

## 2019-11-22 DIAGNOSIS — I10 Essential (primary) hypertension: Secondary | ICD-10-CM | POA: Diagnosis not present

## 2019-11-22 MED ORDER — LOSARTAN POTASSIUM 100 MG PO TABS: 100.0000 mg | ORAL_TABLET | Freq: Every day | ORAL | 1 refills | Status: DC

## 2019-11-22 NOTE — Patient Instructions (Signed)
Medication Instructions:  Your physician has recommended you make the following change in your medication:  1- INCREASE Losartan to 100 mg by mouth once a day.  *If you need a refill on your cardiac medications before your next appointment, please call your pharmacy*  Lab Work: In 2 weeks when you come for follow up appointment- BMET.  If you have labs (blood work) drawn today and your tests are completely normal, you will receive your results only by: Marland Kitchen MyChart Message (if you have MyChart) OR . A paper copy in the mail If you have any lab test that is abnormal or we need to change your treatment, we will call you to review the results.  Testing/Procedures: none  Follow-Up: At [facility], you and your health needs are our priority.  As part of our continuing mission to provide you with exceptional heart care, we have created designated Provider Care Teams.  These Care Teams include your primary Cardiologist (physician) and Advanced Practice Providers (APPs -  Physician Assistants and Nurse Practitioners) who all work together to provide you with the care you need, when you need it.  Your next appointment:   2 week(s) with APP - will need BMET at that time.  The format for your next appointment:   In Person  Provider:    You may see one of the following Advanced Practice Providers on your designated Care Team:    Murray Hodgkins, NP  Christell Faith, PA-C  Marrianne Mood, PA-C

## 2019-11-22 NOTE — Progress Notes (Signed)
Follow-up Outpatient Visit Date: 11/22/2019  Primary Care Provider: [name], MD [place] [address]  Chief Complaint: Follow-up atrial fibrillation and hypertension  HPI:  [name] is a 79 y.o. female with history of paroxysmal atrial fibrillation status post ablation, asymptomatic bradycardia, mitral and aortic regurgitation, chronic kidney disease stage II, hypertension, and hyperlipidemia, who presents for follow-up of hypertension and atrial fibrillation.  I last saw [name] a month ago, which time she was feeling well.  Blood pressure was noted to be poorly controlled prompting [place] to increase losartan to 50 mg daily.  She was seen in our office by Marrianne Mood, PA, on 11/08/2019, at which time she is asymptomatic though blood pressure was still elevated in the setting of continued excess sodium intake.  No medication changes were recommended.  Today, [name] reports that she has been feeling well.  She has not had any palpitations, chest pain, shortness of breath, lightheadedness, or edema.  She notes that her home blood pressures have been upper normal to elevated, around 191 mmHg systolic this morning.  She has been trying to minimize her sodium intake.  --------------------------------------------------------------------------------------------------  Past Medical History:  Diagnosis Date  . Anemia   . Aortic insufficiency    a. echo 11/11: EF 55-60%, mod LVH, mild AS (mean 12), mild AI, mild MR, mild LAE; b. 09/2017 Echo: EF 60-65%, no rwma, mild AS/AI, mod MR. mildly dil LA. PASP 70mmHg.  Marland Kitchen Coronary artery disease, non-occlusive    a. cath 2/09: no CAD, EF 70%  . HYPERLIPIDEMIA   . HYPERTENSION   . OSTEOPENIA   . PAF (paroxysmal atrial fibrillation) ([place])    a. s/p ablation 2013; b. CHADS2VASc -> 4 (HTN, age x 2, female)-->Eliquis.  . Pneumonia 2009  . RA (rheumatoid arthritis) ([place])   . SINUS BRADYCARDIA    a. asymptomatic   .  Unspecified glaucoma(365.9)    Past Surgical History:  Procedure Laterality Date  . ABLATION OF DYSRHYTHMIC FOCUS    . CARDIAC CATHETERIZATION    . COLONOSCOPY  04/18/08  . Partial hysterectomy--1979    . Thoracentesis   12/20/07      Current Meds  Medication Sig  . B Complex Vitamins (VITAMIN B COMPLEX) TABS Take 1 tablet by mouth daily.   . Calcium Carb-Cholecalciferol (CALCIUM 1000 + D) 1000-800 MG-UNIT TABS Take 1 tablet by mouth daily.  . clotrimazole (LOTRIMIN) 1 % cream APPLY TO AFFECTED AREAS TWICE DAILY  . ELIQUIS 5 MG TABS tablet TAKE ONE TABLET BY MOUTH TWICE DAILY  . hydrochlorothiazide (HYDRODIURIL) 25 MG tablet Take 0.5 tablets (12.5 mg total) by mouth daily.  . [DISCONTINUED] losartan (COZAAR) 50 MG tablet Take 1 tablet (50 mg total) by mouth daily.    Allergies: Amlodipine, Celecoxib, and Tramadol  Social History   Tobacco Use  . Smoking status: Never Smoker  . Smokeless tobacco: Never Used  Substance Use Topics  . Alcohol use: No    Alcohol/week: 0.0 standard drinks  . Drug use: No    Family History  Problem Relation Age of Onset  . Diabetes Other   . Breast cancer Paternal Aunt 40  . Heart disease Mother   . Pancreatic cancer Father   . Atrial fibrillation Brother     Review of Systems: A 12-system review of systems was performed and was negative except as noted in the HPI.  --------------------------------------------------------------------------------------------------  Physical Exam: BP (!) 170/90 (BP Location: Left Arm, Patient Position: Sitting,  Cuff Size: Normal)   Pulse (!) 50   Ht 5\' 3"  (1.6 m)   Wt 153 lb 4 oz (69.5 kg)   BMI 27.15 kg/m   General: NAD. Neck: No JVD. Lungs: Normal work of breathing. Clear to auscultation bilaterally without wheezes or crackles. Heart: Bradycardic but regular without murmurs. Abd: Bowel sounds present. Soft, NT/ND without hepatosplenomegaly Ext: No lower extremity edema.  EKG: Sinus bradycardia  with sinus arrhythmia (heart rate 50 bpm) and poor R wave progression in V1 and V2.  No significant change from prior tracing on 10/17/2019.  Lab Results  Component Value Date   WBC 5.9 07/25/2019   HGB 11.9 (L) 07/25/2019   HCT 35.8 (L) 07/25/2019   MCV 93.0 07/25/2019   PLT 225.0 07/25/2019    Lab Results  Component Value Date   NA 141 11/08/2019   K 4.4 11/08/2019   CL 103 11/08/2019   CO2 25 11/08/2019   BUN 30 (H) 11/08/2019   CREATININE 1.25 (H) 11/08/2019   GLUCOSE 88 11/08/2019   ALT 11 07/25/2019    Lab Results  Component Value Date   CHOL 223 (H) 07/25/2019   HDL 76.40 07/25/2019   LDLCALC 135 (H) 07/25/2019   LDLDIRECT 104.4 11/26/2012   TRIG 57.0 07/25/2019   CHOLHDL 3 07/25/2019    --------------------------------------------------------------------------------------------------  ASSESSMENT AND PLAN: Essential hypertension: Blood pressure remains suboptimally controlled.  We have agreed to increase losartan to 100 mg daily and continue HCTZ 12.5 mg daily.  She had worsening renal function in the past with further escalation of HCTZ.  I have reinforced continuation of low-sodium diet.  We will have [name] return for [facility] and follow-up with an APP in about 2 weeks.  Paroxysmal atrial fibrillation: [name] is maintaining sinus rhythm.  She has been asymptomatic.  Continue anticoagulation with apixaban.  Sinus bradycardia: [name] is asymptomatic with long history of bradycardia.  She is not on any rate controlling agents.  We will continue to monitor clinically.  Follow-up: Return to clinic in 2 weeks for reevaluation of blood pressure and BMP.  Nelva Bush, MD 11/22/2019 1:27 PM

## 2019-11-29 DIAGNOSIS — R151 Fecal smearing: Secondary | ICD-10-CM | POA: Insufficient documentation

## 2019-11-29 NOTE — Assessment & Plan Note (Signed)
Nml rectal tone and sphincter strength.  Most likely this issue is from softer stools.  Stop fruit juice. Increase water.  try probiotic. If not improving in next 2-3 weeks.. follow up for further evaluation.

## 2019-12-06 ENCOUNTER — Other Ambulatory Visit: Payer: Self-pay

## 2019-12-06 ENCOUNTER — Ambulatory Visit (INDEPENDENT_AMBULATORY_CARE_PROVIDER_SITE_OTHER): Payer: Medicare Other | Admitting: Family

## 2019-12-06 ENCOUNTER — Encounter: Payer: Self-pay | Admitting: Family

## 2019-12-06 VITALS — BP 160/80 | HR 53 | Ht 63.0 in | Wt 151.5 lb

## 2019-12-06 DIAGNOSIS — R001 Bradycardia, unspecified: Secondary | ICD-10-CM

## 2019-12-06 DIAGNOSIS — I480 Paroxysmal atrial fibrillation: Secondary | ICD-10-CM | POA: Diagnosis not present

## 2019-12-06 DIAGNOSIS — I10 Essential (primary) hypertension: Secondary | ICD-10-CM

## 2019-12-06 MED ORDER — HYDRALAZINE HCL 25 MG PO TABS: 25.0000 mg | ORAL_TABLET | Freq: Three times a day (TID) | ORAL | 3 refills | Status: DC

## 2019-12-06 NOTE — Progress Notes (Signed)
Office Visit    Patient Name: [name] Date of Encounter: 12/06/2019  Primary Care Provider:  [name], MD Primary Cardiologist:  Nelva Bush, MD Electrophysiologist:  None   Chief Complaint    [name] is a 79 y.o. female with a hx of PAF s/p ablation, asymptomatic with cardiac, mitral and aortic regurgitation, CKD 2, HTN, HLD presents today for follow-up of HTN  Past Medical History    Past Medical History:  Diagnosis Date  . Anemia   . Aortic insufficiency    a. echo 11/11: EF 55-60%, mod LVH, mild AS (mean 12), mild AI, mild MR, mild LAE; b. 09/2017 Echo: EF 60-65%, no rwma, mild AS/AI, mod MR. mildly dil LA. PASP 31mmHg.  Marland Kitchen Coronary artery disease, non-occlusive    a. cath 2/09: no CAD, EF 70%  . HYPERLIPIDEMIA   . HYPERTENSION   . OSTEOPENIA   . PAF (paroxysmal atrial fibrillation) ([place])    a. s/p ablation 2013; b. CHADS2VASc -> 4 (HTN, age x 2, female)-->Eliquis.  . Pneumonia 2009  . RA (rheumatoid arthritis) ([place])   . SINUS BRADYCARDIA    a. asymptomatic   . Unspecified glaucoma(365.9)    Past Surgical History:  Procedure Laterality Date  . ABLATION OF DYSRHYTHMIC FOCUS    . CARDIAC CATHETERIZATION    . COLONOSCOPY  04/18/08  . Partial hysterectomy--1979    . Thoracentesis   12/20/07      Allergies  Allergies  Allergen Reactions  . Amlodipine     Ankle swelling  . Celecoxib Other (See Comments)    Tachycardia/palpitations  . Tramadol Nausea Only    History of Present Illness    [name] is a 79 y.o. female with a hx of PAF s/p ablation,  asymptomatic bradycardia, mitral and aortic regurgitation, CKD 2, HTN, HLD.  She was last seen by [name] 11/22/2019.  She has been seen recently for elevated blood pressures.  Her losartan was previously increased to 50 mg and at last office was increased to 100 mg.  She has noted history of worsening renal in the past with further escalation of HCTZ.  Reports she has been  having headaches in the morning. She takes Tylenol 500mg  and it eased up later that day with moving. Notices a couple times per week. Reports no snoring, no daytime somnolence. Low suspicion OSA contributory.   Home BP readings have been checked intermittently with arm cuff and running high. Reports this morning systolic blood pressure 253  Normally takes her medications in the morning right around breakfast 9am and in the evening 9pm.   Has been trying to reduce salt, but tells her husband prefers salt so this is somewhat difficult.   EKGs/Labs/Other Studies Reviewed:   The following studies were reviewed today:   EKG:  EKG is ordered today.  The ekg ordered today demonstrates SB 53 bpm with no acute ST/T wave changes.   Recent Labs: 07/25/2019: ALT 11; Hemoglobin 11.9; Platelets 225.0 11/08/2019: BUN 30; Creatinine, Ser 1.25; Magnesium 2.1; Potassium 4.4; Sodium 141  Recent Lipid Panel    Component Value Date/Time   CHOL 223 (H) 07/25/2019 0905   TRIG 57.0 07/25/2019 0905   HDL 76.40 07/25/2019 0905   CHOLHDL 3 07/25/2019 0905   VLDL 11.4 07/25/2019 0905   LDLCALC 135 (H) 07/25/2019 0905   LDLDIRECT 104.4 11/26/2012 0746    Home Medications   No outpatient medications have been marked as taking for the 12/06/19  encounter (Appointment) with Loel Dubonnet, NP.      Review of Systems      Review of Systems  Constitution: Negative for chills, fever and malaise/fatigue.  Cardiovascular: Negative for chest pain, dyspnea on exertion, irregular heartbeat, leg swelling, near-syncope, orthopnea, palpitations and syncope.  Respiratory: Negative for cough, shortness of breath and wheezing.   Gastrointestinal: Negative for melena, nausea and vomiting.  Genitourinary: Negative for hematuria.  Neurological: Positive for headaches. Negative for dizziness, light-headedness and weakness.   All other systems reviewed and are otherwise negative except as noted above.  Physical Exam     VS:  There were no vitals taken for this visit. , BMI There is no height or weight on file to calculate BMI. GEN: Well nourished, well developed, in no acute distress. HEENT: normal. Neck: Supple, no JVD, carotid bruits, or masses. Cardiac: RRR, no murmurs, rubs, or gallops. No clubbing, cyanosis, edema.  Radials/DP/PT 2+ and equal bilaterally.  Respiratory:  Respirations regular and unlabored, clear to auscultation bilaterally. GI: Soft, nontender, nondistended, BS + x 4. MS: No deformity or atrophy. Skin: Warm and dry, no rash. Neuro:  Strength and sensation are intact. Psych: Normal affect.  Accessory Clinical Findings    ECG personally reviewed by me today - SB 53 bpm - no acute changes.  Assessment & Plan    1. HTN - BP remains elevated. Continue Losartan 100mg  daily and HCTZ 12.5mg  daily. Previous intolerance of increased dose HCTZ with impaired renal function. Avoidance of rate limiting agents in setting of SB. Morning headaches likely etiology of elevated blood pressures, continue Tylenol as needed.   Start Hydralazine 25mg  twice daily - we discussed possible dosing of TID and she was hesitant that she would miss the midday dose. May consider up-titration to 50mg  BID if BP remains uncontrolled.   BMET and TSH today. For re-evaluation of renal function after Losartan increased dose and rule out thyroid abnormality as etiology of elevated BP.  Does not snore, does not have daytime somnolence, though does have morning headache. Low suspicion OSA contributory to HTN.  Could consider renal artery ultrasound to rule out renal artery stenosis if BP continues to be resistant to medication changes.   Low sodium diet recommended.   2. PAF s/p ablation -CHA2DS2-VASc of at least 4 (female, ageX2, HTN).  Continue anticoagulation with Eliquis 5 mg twice daily. Denies bleeding complications. CBC today for monitoring.   3. Sinus bradycardia - Asymptomatic.  Not on any rate limiting  agents.  4. Mitral and aortic regurgitation - Echo 05/2019 with LVEF 60-65%, mild LVH, diastolic dysfunction, moderate MR, mild AI. Continue optimization of BP.   Disposition: Follow up in 2 week(s) with [name] or APP.    Loel Dubonnet, NP 12/06/2019, 9:30 AM

## 2019-12-06 NOTE — Patient Instructions (Addendum)
Medication Instructions:  Your physician has recommended you make the following change in your medication:  1- START Hydralazine 25 mg by mouth two times a day.  *If you need a refill on your cardiac medications before your next appointment, please call your pharmacy*   Lab Work: Your physician recommends that you return for lab work in: TODAY - BMET, CBC, TSH.   If you have labs (blood work) drawn today and your tests are completely normal, you will receive your results only by: Marland Kitchen MyChart Message (if you have MyChart) OR . A paper copy in the mail If you have any lab test that is abnormal or we need to change your treatment, we will call you to review the results.   Testing/Procedures: None  Follow-Up: At [facility], you and your health needs are our priority.  As part of our continuing mission to provide you with exceptional heart care, we have created designated Provider Care Teams.  These Care Teams include your primary Cardiologist (physician) and Advanced Practice Providers (APPs -  Physician Assistants and Nurse Practitioners) who all work together to provide you with the care you need, when you need it.  We recommend signing up for the patient portal called "MyChart".  Sign up information is provided on this After Visit Summary.  MyChart is used to connect with patients for Virtual Visits (Telemedicine).  Patients are able to view lab/test results, encounter notes, upcoming appointments, etc.  Non-urgent messages can be sent to your provider as well.   To learn more about what you can do with MyChart, go to [email or web address].    Your next appointment:   2 WEEKS WITH Dr End or APP.  In person

## 2019-12-07 LAB — BASIC METABOLIC PANEL
BUN/Creatinine Ratio: 22 (ref 12–28)
BUN: 27 mg/dL (ref 8–27)
CO2: 25 mmol/L (ref 20–29)
Calcium: 9.2 mg/dL (ref 8.7–10.3)
Chloride: 103 mmol/L (ref 96–106)
Creatinine, Ser: 1.21 mg/dL — ABNORMAL HIGH (ref 0.57–1.00)
GFR calc Af Amer: 50 mL/min/{1.73_m2} — ABNORMAL LOW (ref >59)
GFR calc non Af Amer: 43 mL/min/{1.73_m2} — ABNORMAL LOW (ref >59)
Glucose: 79 mg/dL (ref 65–99)
Potassium: 3.9 mmol/L (ref 3.5–5.2)
Sodium: 141 mmol/L (ref 134–144)

## 2019-12-07 LAB — CBC WITH DIFFERENTIAL/PLATELET
Basophils Absolute: 0.1 10*3/uL (ref 0.0–0.2)
Basos: 1 %
EOS (ABSOLUTE): 0.2 10*3/uL (ref 0.0–0.4)
Eos: 3 %
Hematocrit: 33 % — ABNORMAL LOW (ref 34.0–46.6)
Hemoglobin: 11.1 g/dL (ref 11.1–15.9)
Immature Grans (Abs): 0 10*3/uL (ref 0.0–0.1)
Immature Granulocytes: 0 %
Lymphocytes Absolute: 1.9 10*3/uL (ref 0.7–3.1)
Lymphs: 34 %
MCH: 30.6 pg (ref 26.6–33.0)
MCHC: 33.6 g/dL (ref 31.5–35.7)
MCV: 91 fL (ref 79–97)
Monocytes Absolute: 0.5 10*3/uL (ref 0.1–0.9)
Monocytes: 9 %
Neutrophils Absolute: 3 10*3/uL (ref 1.4–7.0)
Neutrophils: 53 %
Platelets: 199 10*3/uL (ref 150–450)
RBC: 3.63 x10E6/uL — ABNORMAL LOW (ref 3.77–5.28)
RDW: 12.4 % (ref 11.7–15.4)
WBC: 5.7 10*3/uL (ref 3.4–10.8)

## 2019-12-07 LAB — TSH: TSH: 1.44 u[IU]/mL (ref 0.450–4.500)

## 2019-12-08 ENCOUNTER — Encounter: Payer: Self-pay | Admitting: Family

## 2019-12-11 DIAGNOSIS — H401132 Primary open-angle glaucoma, bilateral, moderate stage: Secondary | ICD-10-CM | POA: Diagnosis not present

## 2019-12-12 ENCOUNTER — Encounter: Payer: Self-pay | Admitting: Cardiovascular Disease

## 2019-12-12 ENCOUNTER — Other Ambulatory Visit: Payer: Self-pay

## 2019-12-12 ENCOUNTER — Ambulatory Visit: Payer: Medicare Other | Admitting: Cardiovascular Disease

## 2019-12-12 VITALS — BP 142/70 | HR 71 | Ht 63.0 in | Wt 151.8 lb

## 2019-12-12 DIAGNOSIS — I2510 Atherosclerotic heart disease of native coronary artery without angina pectoris: Secondary | ICD-10-CM | POA: Diagnosis not present

## 2019-12-12 DIAGNOSIS — I10 Essential (primary) hypertension: Secondary | ICD-10-CM

## 2019-12-12 DIAGNOSIS — N1831 Chronic kidney disease, stage 3a: Secondary | ICD-10-CM

## 2019-12-12 DIAGNOSIS — R001 Bradycardia, unspecified: Secondary | ICD-10-CM

## 2019-12-12 MED ORDER — HYDRALAZINE HCL 50 MG PO TABS: 50.0000 mg | ORAL_TABLET | Freq: Three times a day (TID) | ORAL | 3 refills | Status: DC

## 2019-12-12 NOTE — Patient Instructions (Addendum)
Medication Instructions:  INCREASE HYDRALAZINE TO 50 MG THREE TIMES A DAY DOUBLE UP ON YOUR 25 MG TABLETS UNTIL YOU RUN OUT, NEW RX SENT TO PHARMACY    Labwork: NONE   Testing/Procedures: NONE   Follow-Up: Your physician recommends that you schedule a follow-up appointment in: 1 MONTH WITH PHARM D 01/16/2020 AT 10:00 AM   Your physician recommends that you schedule a follow-up appointment in: [place], WILL CALL YOU WITH APPOINTMENT   You will receive a phone call from the PRE[facility] exercise and nutrition program to schedule an initial assessment.   Special Instructions:   MONITOR YOUR BLOOD PRESSURE TWICE A DAY, LOG IN BOOK PROVIDED. BRING YOUR BOOK TO YOUR FOLLOW UP VISIT IN 1 MONTH   DASH Eating Plan DASH stands for "Dietary Approaches to Stop Hypertension." The DASH eating plan is a healthy eating plan that has been shown to reduce high blood pressure (hypertension). It may also reduce your risk for type 2 diabetes, heart disease, and stroke. The DASH eating plan may also help with weight loss. What are tips for following this plan?  General guidelines  Avoid eating more than 2,300 mg (milligrams) of salt (sodium) a day. If you have hypertension, you may need to reduce your sodium intake to 1,500 mg a day.  Limit alcohol intake to no more than 1 drink a day for nonpregnant women and 2 drinks a day for men. One drink equals 12 oz of beer, 5 oz of wine, or 1 oz of hard liquor.  Work with your health care provider to maintain a healthy body weight or to lose weight. Ask what an ideal weight is for you.  Get at least 30 minutes of exercise that causes your heart to beat faster (aerobic exercise) most days of the week. Activities may include walking, swimming, or biking.  Work with your health care provider or diet and nutrition specialist (dietitian) to adjust your eating plan to your individual calorie needs. Reading food labels   Check food labels for the amount of  sodium per serving. Choose foods with less than 5 percent of the Daily Value of sodium. Generally, foods with less than 300 mg of sodium per serving fit into this eating plan.  To find whole grains, look for the word "whole" as the first word in the ingredient list. Shopping  Buy products labeled as "low-sodium" or "no salt added."  Buy fresh foods. Avoid canned foods and premade or frozen meals. Cooking  Avoid adding salt when cooking. Use salt-free seasonings or herbs instead of table salt or sea salt. Check with your health care provider or pharmacist before using salt substitutes.  Do not fry foods. Cook foods using healthy methods such as baking, boiling, grilling, and broiling instead.  Cook with heart-healthy oils, such as olive, canola, soybean, or sunflower oil. Meal planning  Eat a balanced diet that includes: ? 5 or more servings of fruits and vegetables each day. At each meal, try to fill half of your plate with fruits and vegetables. ? Up to 6-8 servings of whole grains each day. ? Less than 6 oz of lean meat, poultry, or fish each day. A 3-oz serving of meat is about the same size as a deck of cards. One egg equals 1 oz. ? 2 servings of low-fat dairy each day. ? A serving of nuts, seeds, or beans 5 times each week. ? Heart-healthy fats. Healthy fats called Omega-3 fatty acids are found in foods such  as flaxseeds and coldwater fish, like sardines, salmon, and mackerel.  Limit how much you eat of the following: ? Canned or prepackaged foods. ? Food that is high in trans fat, such as fried foods. ? Food that is high in saturated fat, such as fatty meat. ? Sweets, desserts, sugary drinks, and other foods with added sugar. ? Full-fat dairy products.  Do not salt foods before eating.  Try to eat at least 2 vegetarian meals each week.  Eat more home-cooked food and less restaurant, buffet, and fast food.  When eating at a restaurant, ask that your food be prepared with  less salt or no salt, if possible. What foods are recommended? The items listed may not be a complete list. Talk with your dietitian about what dietary choices are best for you. Grains Whole-grain or whole-wheat bread. Whole-grain or whole-wheat pasta. Brown rice. [place]. Bulgur. Whole-grain and low-sodium cereals. Pita bread. Low-fat, low-sodium crackers. Whole-wheat flour tortillas. Vegetables Fresh or frozen vegetables (raw, steamed, roasted, or grilled). Low-sodium or reduced-sodium tomato and vegetable juice. Low-sodium or reduced-sodium tomato sauce and tomato paste. Low-sodium or reduced-sodium canned vegetables. Fruits All fresh, dried, or frozen fruit. Canned fruit in natural juice (without added sugar). Meat and other protein foods Skinless chicken or [place]. Ground chicken or [place]. Pork with fat trimmed off. Fish and seafood. Egg whites. Dried beans, peas, or lentils. Unsalted nuts, nut butters, and seeds. Unsalted canned beans. Lean cuts of beef with fat trimmed off. Low-sodium, lean deli meat. Dairy Low-fat (1%) or fat-free (skim) milk. Fat-free, low-fat, or reduced-fat cheeses. Nonfat, low-sodium ricotta or cottage cheese. Low-fat or nonfat yogurt. Low-fat, low-sodium cheese. Fats and oils Soft margarine without trans fats. Vegetable oil. Low-fat, reduced-fat, or light mayonnaise and salad dressings (reduced-sodium). Canola, safflower, olive, soybean, and sunflower oils. Avocado. Seasoning and other foods Herbs. Spices. Seasoning mixes without salt. Unsalted popcorn and pretzels. Fat-free sweets. What foods are not recommended? The items listed may not be a complete list. Talk with your dietitian about what dietary choices are best for you. Grains Baked goods made with fat, such as croissants, muffins, or some breads. Dry pasta or rice meal packs. Vegetables Creamed or fried vegetables. Vegetables in a cheese sauce. Regular canned vegetables (not low-sodium or  reduced-sodium). Regular canned tomato sauce and paste (not low-sodium or reduced-sodium). Regular tomato and vegetable juice (not low-sodium or reduced-sodium). Angie Fava. Olives. Fruits Canned fruit in a light or heavy syrup. Fried fruit. Fruit in cream or butter sauce. Meat and other protein foods Fatty cuts of meat. Ribs. Fried meat. Berniece Salines. Sausage. Bologna and other processed lunch meats. Salami. Fatback. Hotdogs. Bratwurst. Salted nuts and seeds. Canned beans with added salt. Canned or smoked fish. Whole eggs or egg yolks. Chicken or [place] with skin. Dairy Whole or 2% milk, cream, and half-and-half. Whole or full-fat cream cheese. Whole-fat or sweetened yogurt. Full-fat cheese. Nondairy creamers. Whipped toppings. Processed cheese and cheese spreads. Fats and oils Butter. Stick margarine. Lard. Shortening. Ghee. Bacon fat. Tropical oils, such as coconut, palm kernel, or palm oil. Seasoning and other foods Salted popcorn and pretzels. Onion salt, garlic salt, seasoned salt, table salt, and sea salt. Worcestershire sauce. Tartar sauce. Barbecue sauce. Teriyaki sauce. Soy sauce, including reduced-sodium. Steak sauce. Canned and packaged gravies. Fish sauce. Oyster sauce. Cocktail sauce. Horseradish that you find on the shelf. Ketchup. Mustard. Meat flavorings and tenderizers. Bouillon cubes. Hot sauce and Tabasco sauce. Premade or packaged marinades. Premade or packaged taco seasonings. Relishes. Regular salad dressings.  Where to find more information:  National Heart, Lung, and [place]: https://wilson-eaton.com/  American Heart Association: www.heart.org Summary  The DASH eating plan is a healthy eating plan that has been shown to reduce high blood pressure (hypertension). It may also reduce your risk for type 2 diabetes, heart disease, and stroke.  With the DASH eating plan, you should limit salt (sodium) intake to 2,300 mg a day. If you have hypertension, you may need to reduce your sodium  intake to 1,500 mg a day.  When on the DASH eating plan, aim to eat more fresh fruits and vegetables, whole grains, lean proteins, low-fat dairy, and heart-healthy fats.  Work with your health care provider or diet and nutrition specialist (dietitian) to adjust your eating plan to your individual calorie needs. This information is not intended to replace advice given to you by your health care provider. Make sure you discuss any questions you have with your health care provider. Document Released: 09/15/2011 Document Revised: 09/08/2017 Document Reviewed: 09/19/2016 Elsevier Patient Education  2020 [employer].

## 2019-12-12 NOTE — Progress Notes (Signed)
Hypertension Clinic Initial Assessment:    Date:  12/12/2019   ID:  [name], DOB 09/07/1941, MRN [id number]  PCP:  [name], MD  Cardiologist:  Nelva Bush, MD  Nephrologist:  Referring MD: [name], MD   CC: Hypertension  History of Present Illness:    [name] is a 79 y.o. female with a hx of persistent atrial fibrillation status post ablation, bradycardia, CKD 2, mitral regurgitation, aortic regurgitation, hypertension, and hyperlipidemia here to establish care in the hypertension clinic.  She was first diagnosed with hypertension many decades ago.  In the past it was well-controlled.  However in the last year she has struggled.  Lately her blood pressure has been in the 160s at home.  She last saw [name] on 11/22/2019.  Prior to that losartan was increased to 50 mg.  Her blood pressure remained above goal since making that change.  [name] increased losartan to 100mg .  She followed up with [name], NP, and hydralazine 25mg  bid was added on 2/26.  She thinks her blood pressure has been coming down some lately.  Has been in the 140s to 150s.  She reports daily headaches, including right now.  She is unsure if this is due to her hypertension or her allergies.  She does not get much formal exercise.  She denies any chest pain or shortness of breath.  She also has no lower extremity edema, orthopnea, or PND.  She had an echo 05/14/2019 that revealed LVEF 60 to 65% with grade 2 diastolic dysfunction and mildly elevated pulmonary pressures.  She struggles with salt intake but does try to limit it.  She mostly eats at home and they cook.  Prior to the pandemic she was going to the gym and hopes to get back to this soon.  She takes her medications very consistently.  Her morning medications are taken after breakfast.  She takes her hydralazine at lunchtime around 12 or 1 PM and her evening doses of medicines at 9 PM.   Previous antihypertensives: Amlodipine-  ankle swelling Spironolactone- unsure why it was stopped  Past Medical History:  Diagnosis Date  . Anemia   . Aortic insufficiency    a. echo 11/11: EF 55-60%, mod LVH, mild AS (mean 12), mild AI, mild MR, mild LAE; b. 09/2017 Echo: EF 60-65%, no rwma, mild AS/AI, mod MR. mildly dil LA. PASP 9mmHg.  Marland Kitchen Coronary artery disease, non-occlusive    a. cath 2/09: no CAD, EF 70%  . HYPERLIPIDEMIA   . HYPERTENSION   . OSTEOPENIA   . PAF (paroxysmal atrial fibrillation) ([place])    a. s/p ablation 2013; b. CHADS2VASc -> 4 (HTN, age x 2, female)-->Eliquis.  . Pneumonia 2009  . RA (rheumatoid arthritis) ([place])   . SINUS BRADYCARDIA    a. asymptomatic   . Unspecified glaucoma(365.9)     Past Surgical History:  Procedure Laterality Date  . ABLATION OF DYSRHYTHMIC FOCUS    . CARDIAC CATHETERIZATION    . COLONOSCOPY  04/18/08  . Partial hysterectomy--1979    . Thoracentesis   12/20/07      Current Medications: Current Meds  Medication Sig  . B Complex Vitamins (VITAMIN B COMPLEX) TABS Take 1 tablet by mouth daily.   . Calcium Carb-Cholecalciferol (CALCIUM 1000 + D) 1000-800 MG-UNIT TABS Take 1 tablet by mouth daily.  . clotrimazole (LOTRIMIN) 1 % cream APPLY TO AFFECTED AREAS TWICE DAILY  . ELIQUIS 5 MG TABS tablet TAKE  ONE TABLET BY MOUTH TWICE DAILY  . hydrALAZINE (APRESOLINE) 50 MG tablet Take 1 tablet (50 mg total) by mouth 3 (three) times daily.  Marland Kitchen losartan (COZAAR) 100 MG tablet Take 1 tablet (100 mg total) by mouth daily.  . [DISCONTINUED] hydrALAZINE (APRESOLINE) 25 MG tablet Take 1 tablet (25 mg total) by mouth 3 (three) times daily.     Allergies:   Amlodipine, Celecoxib, and Tramadol   Social History   Socioeconomic History  . Marital status: Widowed    Spouse name: Not on file  . Number of children: Not on file  . Years of education: Not on file  . Highest education level: Not on file  Occupational History  . Not on file  Tobacco Use  . Smoking status: Never Smoker  .  Smokeless tobacco: Never Used  Substance and Sexual Activity  . Alcohol use: No    Alcohol/week: 0.0 standard drinks  . Drug use: No  . Sexual activity: Never  Other Topics Concern  . 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 Full Code ( reviewed 2015)       Occasional exercise.   Diet: fruits and veggies, lean meats.   Social Determinants of Health   Financial Resource Strain:   . Difficulty of Paying Living Expenses: Not on file  Food Insecurity:   . Worried About Charity fundraiser in the Last Year: Not on file  . Ran Out of Food in the Last Year: Not on file  Transportation Needs:   . Lack of Transportation (Medical): Not on file  . Lack of Transportation (Non-Medical): Not on file  Physical Activity:   . Days of Exercise per Week: Not on file  . Minutes of Exercise per Session: Not on file  Stress:   . Feeling of Stress : Not on file  Social Connections:   . Frequency of Communication with Friends and Family: Not on file  . Frequency of Social Gatherings with Friends and Family: Not on file  . Attends Religious Services: Not on file  . Active Member of Clubs or Organizations: Not on file  . Attends Archivist Meetings: Not on file  . Marital Status: Not on file     Family History: The patient's family history includes Atrial fibrillation in her brother; Breast cancer (age of onset: 65) in her paternal aunt; Diabetes in an other family member; Heart attack in her brother; Heart disease in her brother and mother; Pancreatic cancer in her father.  ROS:     Please see the history of present illness.    All other systems reviewed and are negative.  EKGs/Labs/Other Studies Reviewed:    EKG:  EKG is not ordered today.  The ekg ordered 12/06/19 demonstrates sinus bradycardia.  Rate 53 bpm.  Non-specific ST-T changes.   Echo 05/14/19:  1. The left ventricle has normal systolic function with an ejection  fraction of 60-65%. The cavity size was normal. There is mildly increased  left ventricular wall thickness. Left ventricular diastolic Doppler  parameters are consistent with  pseudonormalization.  2. The right ventricle has normal systolic function. The  cavity was  normal. There is no increase in right ventricular wall thickness. Right  ventricular systolic pressure is mildly elevated with an estimated  pressure of 41.7 mmHg.  3. Left atrial size was moderately dilated.  4. Incompletely evaluated hypoechoic structure noted in the liver, most  likely a cyst.  5. The mitral valve is degenerative. Mild thickening of the mitral valve  leaflet. Mitral valve regurgitation is moderate by color flow Doppler.  6. Tricuspid valve regurgitation is moderate.  7. The aortic valve has an indeterminate number of cusps. Mild thickening  of the aortic valve. Mild calcification of the aortic valve. Aortic valve  regurgitation is mild by color flow Doppler.  8. The aorta is normal in size and structure.   Recent Labs: 07/25/2019: ALT 11 11/08/2019: Magnesium 2.1 12/06/2019: BUN 27; Creatinine, Ser 1.21; Hemoglobin 11.1; Platelets 199; Potassium 3.9; Sodium 141; TSH 1.440   Recent Lipid Panel    Component Value Date/Time   CHOL 223 (H) 07/25/2019 0905   TRIG 57.0 07/25/2019 0905   HDL 76.40 07/25/2019 0905   CHOLHDL 3 07/25/2019 0905   VLDL 11.4 07/25/2019 0905   LDLCALC 135 (H) 07/25/2019 0905   LDLDIRECT 104.4 11/26/2012 0746    Physical Exam:    VS:  BP (!) 142/70   Pulse 71   Ht 5\' 3"  (1.6 m)   Wt 151 lb 12.8 oz (68.9 kg)   SpO2 99%    BMI 26.89 kg/m     Wt Readings from Last 3 Encounters:  12/12/19 151 lb 12.8 oz (68.9 kg)  12/06/19 151 lb 8 oz (68.7 kg)  11/22/19 153 lb 4 oz (69.5 kg)     VS:  BP (!) 142/70   Pulse 71   Ht 5\' 3"  (1.6 m)   Wt 151 lb 12.8 oz (68.9 kg)   SpO2 99%   BMI 26.89 kg/m  , BMI Body mass index is 26.89 kg/m. GENERAL:  Well appearing.  No acute distress HEENT: Pupils equal round and reactive, fundi not visualized, oral mucosa unremarkable NECK:  No jugular venous distention, waveform within normal limits, carotid upstroke brisk and symmetric, no bruits LUNGS:  Clear to auscultation bilaterally HEART:  RRR.  PMI not displaced or sustained,S1 and S2 within normal limits, no S3, no S4, no clicks, no rubs, no murmurs ABD:  Flat, positive bowel sounds normal in frequency in pitch, no bruits, no rebound, no guarding, no midline pulsatile mass, no hepatomegaly, no splenomegaly EXT:  2 plus pulses throughout, no edema, no cyanosis no clubbing SKIN:  No rashes no nodules NEURO:  Cranial nerves II through XII grossly intact, motor grossly intact throughout PSYCH:  Cognitively intact, oriented to person place and time   ASSESSMENT:    1. Coronary artery disease, non-occlusive   2. Essential hypertension   3. Sinus bradycardia   4. Stage 3a chronic kidney disease     PLAN:    # Essential hypertension: Blood pressure is improving but still elevated.  She has had a headache today in the 140s.  I suspect her hypertension is not the cause of her headache.  She has a history of bradycardia so we will not add any nodal agents.  Increase hydralazine to 50 mg tid.  Continue losartan and HCTZ.  # Persistent atrial fibrillation: Maintaining sinus rhythm since ablation.  Continue Eliquis.  # Bradycardia:  She is asymptomatic.  We will not add any nodal agents.  # Hyperlipidemia:  ASCVD 10 year risk 27%.  She  wants to work on diet and exercise for the next 3 months.  After that she may need to  start a statin if her ASCVD risk remains elevated.   Disposition:    FU with MD/PharmD in 1 month     Medication Adjustments/Labs and Tests Ordered: Current medicines are reviewed at length with the patient today.  Concerns regarding medicines are outlined above.  No orders of the defined types were placed in this encounter.  Meds ordered this encounter  Medications  . hydrALAZINE (APRESOLINE) 50 MG tablet    Sig: Take 1 tablet (50 mg total) by mouth 3 (three) times daily.    Dispense:  90 tablet    Refill:  3    NEW DOSE, D/C PREVIOUS RX     Signed, [name], MD  12/12/2019 9:07 PM    [facility]

## 2019-12-16 ENCOUNTER — Telehealth: Payer: Self-pay

## 2019-12-16 NOTE — Progress Notes (Deleted)
Cardiology Office Note    Date:  12/16/2019   ID:  VADIS SLABACH, DOB 11-Sep-1941, MRN [id number]  PCP:  [name], MD  Cardiologist:  Nelva Bush, MD  Electrophysiologist:  None   Chief Complaint: Follow-up  History of Present Illness:   [name] is a 79 y.o. female with history of PAF s/p ablation in 2013 on Eliquis, paroxysmal SVT, mitral regurgitation, aortic insufficiency, asymptomatic bradycardia, CKD stage II, HTN, and HLD who presents for ***.  She underwent cardiac cath in 2009 that showed no significant CAD with an EF of 70%. Prior echo from 09/2017 showed an EF of 60-65%, no RWMA, mild AS/AI, moderate MR, mildly dilated left atrium and a PASP of 33 mmHg.  She was evaluated in the summer of 2020 for palpitations, dyspnea, and swelling. Zio patch in 04/2019 showed a predominant rhythm of sinus with an average heart rate of 61 bpm (range 37-109 bpm), 21 atrial runs occurred lasting up to 20 beats with a maximal rate of 169 bpm, no sustained arrhythmias or prolonged pauses. Echo in 05/2019 showed an EF of 60-65%, DD, normal RVSF and cavity size, moderately dilated left atrium, moderate MR, moderate TR, mild AI, PASP 41.7 mmHg.  With regards to her HTN, this was diagnosed may decades ago. More recently, she has had difficulty in controlling her blood pressure leading to escalation of therapy. She has most recently established with the hypertension clinic in [place] and was evaluated by Dr. Oval Linsey on 12/12/2019 with recommendation to titrate hydralazine to 50 mg tid and continue current doses of losartan and HCTZ. She was maintaining sinus rhythm at that time with noted asymptomatic bradycardia.   ***   Labs independently reviewed: 11/2019 - Hgb 11.1, PLT 199, TSH normal, BUN 27, serum creatinine 1.21, potassium 3.9, magnesium 2.1 07/2019 - TC 223, TG 57, HDL 76, LDL 135, albumin 4.0, AST/ALT normal  Past Medical History:  Diagnosis Date  . Anemia   . Aortic  insufficiency    a. echo 11/11: EF 55-60%, mod LVH, mild AS (mean 12), mild AI, mild MR, mild LAE; b. 09/2017 Echo: EF 60-65%, no rwma, mild AS/AI, mod MR. mildly dil LA. PASP 73mmHg.  Marland Kitchen Coronary artery disease, non-occlusive    a. cath 2/09: no CAD, EF 70%  . HYPERLIPIDEMIA   . HYPERTENSION   . OSTEOPENIA   . PAF (paroxysmal atrial fibrillation) ([place])    a. s/p ablation 2013; b. CHADS2VASc -> 4 (HTN, age x 2, female)-->Eliquis.  . Pneumonia 2009  . RA (rheumatoid arthritis) ([place])   . SINUS BRADYCARDIA    a. asymptomatic   . Unspecified glaucoma(365.9)     Past Surgical History:  Procedure Laterality Date  . ABLATION OF DYSRHYTHMIC FOCUS    . CARDIAC CATHETERIZATION    . COLONOSCOPY  04/18/08  . Partial hysterectomy--1979    . Thoracentesis   12/20/07      Current Medications: No outpatient medications have been marked as taking for the 12/20/19 encounter (Appointment) with Rise Mu, PA-C.    Allergies:   Amlodipine, Celecoxib, and Tramadol   Social History   Socioeconomic History  . Marital status: Widowed    Spouse name: Not on file  . Number of children: Not on file  . Years of education: Not on file  . Highest education level: Not on file  Occupational History  . Not on file  Tobacco Use  . Smoking status: Never Smoker  . Smokeless tobacco: Never Used  Substance and Sexual Activity  . Alcohol use: No    Alcohol/week: 0.0 standard drinks  . Drug use: No  . Sexual activity: Never  Other Topics Concern  . 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 Full Code (  reviewed 2015)       Occasional exercise.   Diet: fruits and veggies, lean meats.   Social Determinants of Health   Financial Resource Strain:   . Difficulty of Paying Living Expenses: Not on file  Food Insecurity:   . Worried About Charity fundraiser in the Last Year: Not on file  . Ran Out of Food in the Last Year: Not on file  Transportation Needs:   . Lack of Transportation (Medical): Not on file  . Lack of Transportation (Non-Medical): Not on file  Physical Activity:   . Days of Exercise per Week: Not on file  . Minutes of Exercise per Session: Not on file  Stress:   . Feeling of Stress : Not on file  Social Connections:   . Frequency of Communication with Friends and Family: Not on file  . Frequency of Social Gatherings with Friends and Family: Not on file  . Attends Religious Services: Not on file  . Active Member of Clubs or Organizations: Not on file  . Attends Archivist Meetings: Not on file  . Marital Status: Not on file     Family History:  The patient's family history includes Atrial fibrillation in her brother; Breast cancer (age of onset: 65) in her paternal aunt; Diabetes in an other family member; Heart attack in her brother; Heart disease in her brother and mother; Pancreatic cancer in her father.  ROS:   ROS   EKGs/Labs/Other Studies Reviewed:    Studies reviewed were summarized above. The additional studies were reviewed today:  2D echo 05/2019: 1. The left ventricle has normal systolic function with an ejection  fraction of 60-65%. The cavity size was normal. There is mildly increased  left ventricular wall thickness. Left ventricular diastolic Doppler  parameters are consistent with  pseudonormalization.  2. The right ventricle has normal systolic function. The cavity was  normal. There is no increase in right ventricular wall thickness. Right  ventricular systolic pressure is mildly elevated with an estimated  pressure of 41.7 mmHg.    3. Left atrial size was moderately dilated.  4. Incompletely evaluated hypoechoic structure noted in the liver, most  likely a cyst.  5. The mitral valve is degenerative. Mild thickening of the mitral valve  leaflet. Mitral valve regurgitation is moderate by color flow Doppler.  6. Tricuspid valve regurgitation is moderate.  7. The aortic valve has an indeterminate number of cusps. Mild thickening  of the aortic valve. Mild calcification of the aortic valve. Aortic valve  regurgitation is mild by color flow Doppler.  8. The aorta is normal in size and structure.  __________  Elwyn Reach patch 03/2019:  The patient was monitored for 13 days, 20 hours.  Predominant rhythm was sinus with an average rate of 61 bpm (range 37-109 bpm in sinus rhythm).  There were occasional PACs and rare  PVCs.  21 atrial runs occurred, lasting up to 20 beats with a maximal rate of 169 bpm.  There was no sustained arrhythmia or prolonged pause.  There were no patient triggered events.   Predominantly sinus rhythm with occasional PACs and rare PVCs.  Multiple episodes of PSVT noted, lasting up to 20 beats (10.2 seconds).   EKG:  EKG is ordered today.  The EKG ordered today demonstrates ***  Recent Labs: 07/25/2019: ALT 11 11/08/2019: Magnesium 2.1 12/06/2019: BUN 27; Creatinine, Ser 1.21; Hemoglobin 11.1; Platelets 199; Potassium 3.9; Sodium 141; TSH 1.440  Recent Lipid Panel    Component Value Date/Time   CHOL 223 (H) 07/25/2019 0905   TRIG 57.0 07/25/2019 0905   HDL 76.40 07/25/2019 0905   CHOLHDL 3 07/25/2019 0905   VLDL 11.4 07/25/2019 0905   LDLCALC 135 (H) 07/25/2019 0905   LDLDIRECT 104.4 11/26/2012 0746    PHYSICAL EXAM:    VS:  There were no vitals taken for this visit.  BMI: There is no height or weight on file to calculate BMI.  Physical Exam  Wt Readings from Last 3 Encounters:  12/12/19 151 lb 12.8 oz (68.9 kg)  12/06/19 151 lb 8 oz (68.7 kg)  11/22/19 153 lb 4 oz (69.5 kg)      ASSESSMENT & PLAN:   1. ***  Disposition: F/u with [name] or an APP in ***, as well as Dr. Ron Parker.D. with the hypertension clinic as directed.   Medication Adjustments/Labs and Tests Ordered: Current medicines are reviewed at length with the patient today.  Concerns regarding medicines are outlined above. Medication changes, Labs and Tests ordered today are summarized above and listed in the Patient Instructions accessible in Encounters.   Signed, Christell Faith, PA-C 12/16/2019 4:01 PM     [address] [place], [place] [address] [phone]

## 2019-12-16 NOTE — Telephone Encounter (Signed)
Call to patient reference referral to PREP.  Is interested in program but distance might be a barrier. Currently doing online exercise program.  She will investigate how far (47 min by GPS and address in Epic). I will investigate if [facility] has any program which would be about 10 min from her. We will reconnect by Friday.

## 2019-12-19 DIAGNOSIS — H401131 Primary open-angle glaucoma, bilateral, mild stage: Secondary | ICD-10-CM | POA: Diagnosis not present

## 2019-12-20 ENCOUNTER — Telehealth: Payer: Self-pay

## 2019-12-20 ENCOUNTER — Ambulatory Visit: Payer: Medicare Other | Admitting: Physician Assistant

## 2019-12-20 NOTE — Telephone Encounter (Signed)
Call to patient reference PREP class starting 3/29 and if patient made a decision about joining class. Distance is a barrier as it would be a long drive for her although she is interested. Text patient address of [facility] so she could measure distance and will circle back around mid week for decision Would like something closer to home

## 2019-12-24 ENCOUNTER — Telehealth: Payer: Self-pay | Admitting: Internal Medicine

## 2019-12-24 NOTE — Telephone Encounter (Signed)
Secure chat message received from the patient's granddaughter, Isac Caddy, RN at the [facility] office asking if we could get the patient in for an office visit for new onset SOB/ swelling.   Per Lars Mage, the patient advised her her feet were so big they are bulging over her shoes and they are the biggest she has ever seen them.  The patient has reported on change to her diet and no standing for long periods of time. She is unable to provide weights as her scale needs batteries.   I have offered an appointment with [name], NP tomorrow morning at 10:55 am. Lars Mage is agreeable.

## 2019-12-25 ENCOUNTER — Ambulatory Visit (INDEPENDENT_AMBULATORY_CARE_PROVIDER_SITE_OTHER): Payer: Medicare Other | Admitting: Nurse Practitioner

## 2019-12-25 ENCOUNTER — Other Ambulatory Visit: Payer: Self-pay

## 2019-12-25 ENCOUNTER — Encounter: Payer: Self-pay | Admitting: Nurse Practitioner

## 2019-12-25 VITALS — BP 150/74 | HR 62 | Ht 63.0 in | Wt 156.1 lb

## 2019-12-25 DIAGNOSIS — I38 Endocarditis, valve unspecified: Secondary | ICD-10-CM

## 2019-12-25 DIAGNOSIS — I480 Paroxysmal atrial fibrillation: Secondary | ICD-10-CM

## 2019-12-25 DIAGNOSIS — I10 Essential (primary) hypertension: Secondary | ICD-10-CM

## 2019-12-25 DIAGNOSIS — R600 Localized edema: Secondary | ICD-10-CM | POA: Diagnosis not present

## 2019-12-25 MED ORDER — HYDRALAZINE HCL 50 MG PO TABS: 75.0000 mg | ORAL_TABLET | Freq: Three times a day (TID) | ORAL | 3 refills | Status: DC

## 2019-12-25 NOTE — Progress Notes (Signed)
Office Visit    Patient Name: [name] Date of Encounter: 12/25/2019  Primary Care Provider:  [name], MD Primary Cardiologist:  Nelva Bush, MD  Chief Complaint    79 year old female with a history of nonobstructive CAD, paroxysmal atrial fibrillation status post catheter ablation in 2013, asymptomatic bradycardia, mitral, tricuspid, and aortic regurgitation, stage II chronic kidney disease, hypertension, and hyperlipidemia, who presents for follow-up related to lower extremity swelling and HTN.  Past Medical History    Past Medical History:  Diagnosis Date  . Anemia   . Coronary artery disease, non-occlusive    a. cath 2/09: no CAD, EF 70%  . HYPERLIPIDEMIA   . HYPERTENSION   . Mild Aortic insufficiency   . Moderate mitral regurgitation   . Moderate tricuspid regurgitation   . OSTEOPENIA   . PAF (paroxysmal atrial fibrillation) ([place])    a. s/p ablation 2013; b. CHADS2VASc -> 4 (HTN, age x 2, female)-->Eliquis.  Marland Kitchen PAT (paroxysmal atrial tachycardia) ([place])    a. 04/2019 Zio: Occas PACs and rare PVCs. 21 atrial runs - longest 20 beats, max rate 169.  Marland Kitchen Pneumonia 2009  . RA (rheumatoid arthritis) ([place])   . Sinus Bradycardia    a. asymptomatic but prevents use of AVN blocking agents; b. 04/2019 Zio: Avg HR 61 (37-109).  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 Valvular heart disease    a. 05/2019 Echo: EF 60-65%. DD. RVSP 41.48mmHg. Mod dil LA. Mod MR/TR. Mild AI.   Past Surgical History:  Procedure Laterality Date  . ABLATION OF DYSRHYTHMIC FOCUS    . CARDIAC CATHETERIZATION    . COLONOSCOPY  04/18/08  . Partial hysterectomy--1979    . Thoracentesis   12/20/07      Allergies  Allergies  Allergen Reactions  . Amlodipine     Ankle swelling  . Celecoxib Other (See Comments)    Tachycardia/palpitations  . Tramadol Nausea Only    History of Present Illness    79 year old female with the above past medical history including nonobstructive CAD, paroxysmal  atrial fibrillation status post catheter ablation, asymptomatic bradycardia, mitral, tricuspid, and aortic regurgitation, stage III chronic kidney disease, hypertension, and hyperlipidemia.  In June 2020, she noted increasing palpitations and a ZIO monitor was placed showing PACs and brief runs of SVT up to 20 beats at a rate of 169 bpm.  Echocardiogram at that time showed an EF of 60 to 69% with diastolic dysfunction, and RVSP of 41.7 mmHg, moderately dilated left atrium, mild AI, and moderate tricuspid and mitral regurgitation.  When seen at follow-up in August 2020, she was bradycardic in the 40s and beta-blocker therapy was discontinued.  She was not having significant palpitations at October follow-up and EP referral was deferred.  Through early 2021, blood pressures have been elevated requiring titration of losartan therapy to 100 mg on February 12.  She is also been maintained on HCTZ 12.5 mg daily (dose limited by prior AKI), and subsequently hydralazine therapy, which was titrated to 50 mg 3 times daily on March 4.  Our office was contacted by the patient's granddaughter on March 16 because the patient had noted significant lower extremity edema.  Today, she says that edema was first noted on Sunday morning (3/14) but has since improved markedly.  In retrospect, she did eat [place] food for dinner on 3/13 and had Chinese fried rice and vegetables again yesterday.  Her wt is also up 5 lbs on our scale vs scale @ [facility] office  on 3/4.  In all, she says that she prepares her own meals from scratch, and rarely uses salt or eats out.  The past few days have been an anomaly, however.  In the setting of swelling and wt gain, she did note a slight increase in DOE earlier in the week, though this has also improved.  She cont to check her blood pressure twice a day at home and has continued to note blood pressures trending in the 150s to 160s.  Over the past few weeks, there were 2 mornings, prior to her medicines,  where systolics were in the 81O.  She was asymptomatic at the time and did take her usual medicines on those mornings.  Blood pressure today is elevated at 150/74.  She denies chest pain, palpitations, PND, orthopnea, dizziness, syncope, or early satiety.  Home Medications    Prior to Admission medications   Medication Sig Start Date End Date Taking? Authorizing Provider  B Complex Vitamins (VITAMIN B COMPLEX) TABS Take 1 tablet by mouth daily.     [provider]  Calcium Carb-Cholecalciferol (CALCIUM 1000 + D) 1000-800 MG-UNIT TABS Take 1 tablet by mouth daily.    [provider]  clotrimazole (LOTRIMIN) 1 % cream APPLY TO AFFECTED AREAS TWICE DAILY 05/16/19   Bedsole, Amy E, MD  ELIQUIS 5 MG TABS tablet TAKE ONE TABLET BY MOUTH TWICE DAILY 10/09/19   End, Harrell Gave, MD  hydrALAZINE (APRESOLINE) 50 MG tablet Take 1 tablet (50 mg total) by mouth 3 (three) times daily. 12/12/19 03/11/20  [name], MD  hydrochlorothiazide (HYDRODIURIL) 25 MG tablet Take 0.5 tablets (12.5 mg total) by mouth daily. 07/18/19 12/06/19  End, Harrell Gave, MD  losartan (COZAAR) 100 MG tablet Take 1 tablet (100 mg total) by mouth daily. 11/22/19 02/20/20  End, Harrell Gave, MD    Review of Systems    Earlier this week, she noted lower extremity edema and increasing dyspnea.  Both have improved.  She denies chest pain, palpitations, PND, orthopnea, dizziness, syncope, or early satiety.  All other systems reviewed and are otherwise negative except as noted above.  Physical Exam    VS:  BP (!) 150/74 (BP Location: Left Arm, Patient Position: Sitting, Cuff Size: Normal)   Pulse 62   Ht 5\' 3"  (1.6 m)   Wt 156 lb 2 oz (70.8 kg)   SpO2 96%   BMI 27.66 kg/m  , BMI Body mass index is 27.66 kg/m. GEN: Well nourished, well developed, in no acute distress. HEENT: normal. Neck: Supple, no JVD, carotid bruits, or masses. Cardiac: RRR, 2/6 systolic murmur heard throughout, no rubs, or gallops. No clubbing,  cyanosis, trace bilateral ankle edema.  Radials/PT 2+ and equal bilaterally.  Respiratory:  Respirations regular and unlabored, clear to auscultation bilaterally. GI: Soft, nontender, nondistended, BS + x 4. MS: no deformity or atrophy. Skin: warm and dry, no rash. Neuro:  Strength and sensation are intact. Psych: Normal affect.  Accessory Clinical Findings    ECG personally reviewed by me today -regular sinus rhythm, 62 nonspecific ST changes - no acute changes.  Lab Results  Component Value Date   WBC 5.7 12/06/2019   HGB 11.1 12/06/2019   HCT 33.0 (L) 12/06/2019   MCV 91 12/06/2019   PLT 199 12/06/2019   Lab Results  Component Value Date   CREATININE 1.21 (H) 12/06/2019   BUN 27 12/06/2019   NA 141 12/06/2019   K 3.9 12/06/2019   CL 103 12/06/2019   CO2 25 12/06/2019  Lab Results  Component Value Date   ALT 11 07/25/2019   AST 21 07/25/2019   ALKPHOS 59 07/25/2019   BILITOT 0.9 07/25/2019   Lab Results  Component Value Date   CHOL 223 (H) 07/25/2019   HDL 76.40 07/25/2019   LDLCALC 135 (H) 07/25/2019   LDLDIRECT 104.4 11/26/2012   TRIG 57.0 07/25/2019   CHOLHDL 3 07/25/2019    Assessment & Plan    1.  Essential hypertension: Patient's blood pressures continue to trend in the 150s to 160s at home.  She is currently on HCTZ 12.5 mg daily, losartan 100 mg daily, and hydralazine 50 mg 3 times daily.  She reports good compliance.  She recently has had some lower extremity swelling and mild dyspnea in the setting of eating [place] food on 2 occasions in the past few days.  Swelling and dyspnea have mostly resolved at this point.  We discussed the importance of limiting salt intake and avoiding processed foods.  Pressure is 150/74 today.  I have asked her to increase her hydralazine to 75 mg 3 times daily.  She has follow-up scheduled with hypertension clinic on April 8.  2.  Lower extremity edema: Patient awoke on March 14 with lower extremity swelling after eating  [place] food the day before.  She has only trace ankle edema at this time.  Her weight is up 5 pounds on our scale since March 4 ([facility] office) though with the exception of only trace ankle edema, she is not particularly volume overloaded.  As above, I counseled her on the importance of sodium avoidance and understanding that restaurant food is generally going to be saltier than things that she is making from scratch, and thus may contribute to lower extremity edema and possibly dyspnea.  She remains on HCTZ 12.5 mg daily.  I prefer to see her adjust her sodium intake/diet over adjusting her diuretic at this time.  She notes that she has been on furosemide in the past and wishes to avoid it in the future.  3.  Paroxysmal atrial fibrillation: Status post catheter ablation in 2013.  No recent palpitations.  She remains chronically anticoagulated on Eliquis and H&H were stable in February.  4.  History of sinus bradycardia: Heart rate 62 today.  Avoiding AV nodal blocking agents.  5.  Valvular Heart Dzs:  Most recent echo August 2020.  Normal LV function with diastolic dysfunction.  Moderate mitral regurgitation/tricuspid regurgitation and mild aortic insufficiency.  Continue to focus on blood pressure management and volume control.  6.  Disposition: Patient will continue to check blood pressures at home.  She has follow-up in hypertension clinic on April 8.  Murray Hodgkins, NP 12/25/2019, 11:58 AM

## 2019-12-25 NOTE — Patient Instructions (Signed)
Medication Instructions:  1- INCREASE Hydralazine Take 1.5 tablets (75 mg total) by mouth 3 (three) times daily *If you need a refill on your cardiac medications before your next appointment, please call your pharmacy*   Lab Work: None ordered  If you have labs (blood work) drawn today and your tests are completely normal, you will receive your results only by: Marland Kitchen MyChart Message (if you have MyChart) OR . A paper copy in the mail If you have any lab test that is abnormal or we need to change your treatment, we will call you to review the results.   Testing/Procedures: None ordered    Follow-Up: At [facility], you and your health needs are our priority.  As part of our continuing mission to provide you with exceptional heart care, we have created designated Provider Care Teams.  These Care Teams include your primary Cardiologist (physician) and Advanced Practice Providers (APPs -  Physician Assistants and Nurse Practitioners) who all work together to provide you with the care you need, when you need it.  We recommend signing up for the patient portal called "MyChart".  Sign up information is provided on this After Visit Summary.  MyChart is used to connect with patients for Virtual Visits (Telemedicine).  Patients are able to view lab/test results, encounter notes, upcoming appointments, etc.  Non-urgent messages can be sent to your provider as well.   To learn more about what you can do with MyChart, go to [email or web address].    Your next appointment:   3 month(s)  The format for your next appointment:   In Person  Provider:    You may see Nelva Bush, MD or Murray Hodgkins, NP.

## 2019-12-30 ENCOUNTER — Telehealth: Payer: Self-pay

## 2019-12-30 NOTE — Telephone Encounter (Signed)
Follow up call to patient reference PREP Confirmed [facility] [facility] is too far for her to attend.  Would be interested in something closer to home. Will refer out [facility] program via Serita Butcher of H&V clinic

## 2019-12-31 ENCOUNTER — Telehealth: Payer: Self-pay | Admitting: *Deleted

## 2019-12-31 NOTE — Telephone Encounter (Addendum)
Spoke with patients granddaughter Lars Mage and her blood pressure has been running in the 90's/40's at times.  Patient has been taking her Hydralazine 75 mg in morning 50 mg at 5 pm and 50 mg around 8-9 pm. She as been having lots of .blood pressure readings in the 90's/40's, with highest SBP 130's. She is having some dizziness. Discussed with Dr Oval Linsey and will decrease Hydralazine to 50 mg three times a day and space out evenly throughout the day. Roger Kill, verbalized understanding. She will continue to monitor

## 2020-01-01 ENCOUNTER — Telehealth (HOSPITAL_COMMUNITY): Payer: Self-pay | Admitting: *Deleted

## 2020-01-01 NOTE — Telephone Encounter (Signed)
Patient referred per Dr. Oval Linsey for Exercise. Patient was originally referred to the PREP program at [facility] by Dr. Oval Linsey via Hypertension Clinic. She is unable to attend the PREP program due to its location and transportation challenges. She is very interested in the [employer] at [facility]. I will receive clearance from Dr. Oval Linsey to send referral for initiation of her membership.   She was provided detailed information regarding the environment, cost and expectation of the [place]. She is very excited and would like to join as soon as possible.    Landis Martins, MS, ACSM-RCEP Clinical Exercise Physiologist/ Health and Wellness Coach

## 2020-01-02 DIAGNOSIS — H2000 Unspecified acute and subacute iridocyclitis: Secondary | ICD-10-CM | POA: Diagnosis not present

## 2020-01-16 ENCOUNTER — Ambulatory Visit: Payer: Medicare Other

## 2020-02-19 ENCOUNTER — Other Ambulatory Visit: Payer: Self-pay | Admitting: Internal Medicine

## 2020-02-28 ENCOUNTER — Telehealth: Payer: Self-pay | Admitting: *Deleted

## 2020-02-28 NOTE — Telephone Encounter (Signed)
Spoke with patient and established a appointment with the pharmacy.

## 2020-02-28 NOTE — Telephone Encounter (Signed)
Contacted with patient to follow-up on [place]. She is very pleased with the facility and is attending 3days a week. She has been feeling better and says she has more energy. Also states her BP has improved and seems very pleased by this. She inquired about her follow-up with Dr. Oval Linsey and I encouraged her to contact the clinic office for that specific information, as I do not see a follow-up scheduled at this time.     Landis Martins, MS, ACSM, NBC-HWC Clinical Exercise Physiologist/ Health and Wellness Coach

## 2020-03-16 ENCOUNTER — Encounter: Payer: Self-pay | Admitting: Internal Medicine

## 2020-03-16 ENCOUNTER — Other Ambulatory Visit: Payer: Self-pay

## 2020-03-16 ENCOUNTER — Ambulatory Visit (INDEPENDENT_AMBULATORY_CARE_PROVIDER_SITE_OTHER): Payer: Medicare Other | Admitting: Internal Medicine

## 2020-03-16 DIAGNOSIS — I10 Essential (primary) hypertension: Secondary | ICD-10-CM | POA: Diagnosis not present

## 2020-03-16 DIAGNOSIS — R609 Edema, unspecified: Secondary | ICD-10-CM

## 2020-03-16 DIAGNOSIS — I4819 Other persistent atrial fibrillation: Secondary | ICD-10-CM

## 2020-03-16 NOTE — Assessment & Plan Note (Signed)
Mild though she is worried about it Echo from 8/20 shows normal LV EF I don't think she has CHF Likely just venous insufficiency She hasn't tried compression hose Discussed elevation and care with salt in food Would consider brief trial of furosemide if doesn't improve

## 2020-03-16 NOTE — Assessment & Plan Note (Signed)
Regular now Is on the eliquis 

## 2020-03-16 NOTE — Assessment & Plan Note (Signed)
BP Readings from Last 3 Encounters:  03/16/20 128/74  12/25/19 (!) 150/74  12/12/19 (!) 142/70   BP better Off the hydralazine that can cause edema at times Continue losartan/HCTZ

## 2020-03-16 NOTE — Progress Notes (Signed)
Subjective:    Patient ID: [name], female    DOB: [birth year], 79 y.o.   MRN: [id number]  HPI Here due to ankle swelling in both legs This visit occurred during the SARS-CoV-2 public health emergency.  Safety protocols were in place, including screening questions prior to the visit, additional usage of staff PPE, and extensive cleaning of exam room while observing appropriate contact time as indicated for disinfecting solutions.   This is worse than it was a few months back Started 2 days ago Doesn't remember anything new in diet---avoids salt No trouble breathing No chest pain No palpitations No dizziness or syncope  First time for edema---~1 year ago No pain in feet--just can't wear her regular shoes  Current Outpatient Medications on File Prior to Visit  Medication Sig Dispense Refill  . B Complex Vitamins (VITAMIN B COMPLEX) TABS Take 1 tablet by mouth daily.     . Calcium Carb-Cholecalciferol (CALCIUM 1000 + D) 1000-800 MG-UNIT TABS Take 1 tablet by mouth daily.    . clotrimazole (LOTRIMIN) 1 % cream APPLY TO AFFECTED AREAS TWICE DAILY 42.5 g 0  . ELIQUIS 5 MG TABS tablet TAKE ONE TABLET BY MOUTH TWICE DAILY 180 tablet 2  . hydrochlorothiazide (HYDRODIURIL) 25 MG tablet Take 0.5 tablets (12.5 mg total) by mouth daily. 15 tablet 4  . losartan (COZAAR) 100 MG tablet Take 1 tablet (100 mg total) by mouth daily. 90 tablet 1   No current facility-administered medications on file prior to visit.    Allergies  Allergen Reactions  . Amlodipine     Ankle swelling  . Celecoxib Other (See Comments)    Tachycardia/palpitations  . Tramadol Nausea Only    Past Medical History:  Diagnosis Date  . Anemia   . Coronary artery disease, non-occlusive    a. cath 2/09: no CAD, EF 70%  . HYPERLIPIDEMIA   . HYPERTENSION   . Mild Aortic insufficiency   . Moderate mitral regurgitation   . Moderate tricuspid regurgitation   . OSTEOPENIA   . PAF (paroxysmal atrial  fibrillation) ([place])    a. s/p ablation 2013; b. CHADS2VASc -> 4 (HTN, age x 2, female)-->Eliquis.  Marland Kitchen PAT (paroxysmal atrial tachycardia) ([place])    a. 04/2019 Zio: Occas PACs and rare PVCs. 21 atrial runs - longest 20 beats, max rate 169.  Marland Kitchen Pneumonia 2009  . RA (rheumatoid arthritis) ([place])   . Sinus Bradycardia    a. asymptomatic but prevents use of AVN blocking agents; b. 04/2019 Zio: Avg HR 61 (37-109).  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 Valvular heart disease    a. 05/2019 Echo: EF 60-65%. DD. RVSP 41.50mmHg. Mod dil LA. Mod MR/TR. Mild AI.    Past Surgical History:  Procedure Laterality Date  . ABLATION OF DYSRHYTHMIC FOCUS    . CARDIAC CATHETERIZATION    . COLONOSCOPY  04/18/08  . Partial hysterectomy--1979    . Thoracentesis   12/20/07      Family History  Problem Relation Age of Onset  . Diabetes Other   . Breast cancer Paternal Aunt 74  . Heart disease Mother   . Pancreatic cancer Father   . Atrial fibrillation Brother   . Heart disease Brother   . Heart attack Brother     Social History   Socioeconomic History  . Marital status: Widowed    Spouse name: Not on file  . Number of children: Not on file  . Years of education: Not on file  . Highest  education level: Not on file  Occupational History  . Not on file  Tobacco Use  . Smoking status: Never Smoker  . Smokeless tobacco: Never Used  Substance and Sexual Activity  . Alcohol use: No    Alcohol/week: 0.0 standard drinks  . Drug use: No  . Sexual activity: Never  Other Topics Concern  . 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 Full Code ( reviewed 2015)       Occasional exercise.   Diet: fruits and veggies, lean meats.   Social Determinants of Health   Financial Resource Strain:   . Difficulty of Paying Living Expenses:   Food Insecurity:   . Worried About Charity fundraiser in the Last Year:   . Arboriculturist in the Last Year:   Transportation Needs:   . Film/video editor (Medical):   Marland Kitchen Lack of Transportation (Non-Medical):   Physical Activity:   . Days of Exercise per Week:   . Minutes of Exercise per Session:   Stress:   . Feeling of Stress :   Social Connections:   . Frequency of Communication with Friends and Family:   . Frequency of Social Gatherings with Friends and Family:   . Attends Religious Services:   . Active Member of Clubs or Organizations:   . Attends Archivist Meetings:   Marland Kitchen Marital Status:   Intimate Partner Violence:   . Fear of Current or Ex-Partner:   . Emotionally Abused:   Marland Kitchen Physically Abused:   . Sexually Abused:    Review of Systems Weight is up at home--- 150# Has been voiding more today and yesterday No recent travel Sleeps in bed flat. No PND    Objective:   Physical Exam  Neck: No thyromegaly present.  Cardiovascular: Normal rate, regular rhythm and normal heart sounds. Exam reveals no gallop.  No murmur heard. Respiratory: Effort normal. No respiratory distress. She has no wheezes. She has no rales.  Slightly decreased breath sounds on left side--but no dullness  Musculoskeletal:     Comments: 1+ pitting edema in ankles  Lymphadenopathy:    She has no cervical adenopathy.           Assessment & Plan:

## 2020-03-18 ENCOUNTER — Emergency Department
Admission: EM | Admit: 2020-03-18 | Discharge: 2020-03-18 | Disposition: A | Discharge status: Home / Self Care | Payer: Medicare Other | Attending: Emergency Medicine | Admitting: Emergency Medicine

## 2020-03-18 ENCOUNTER — Encounter: Payer: Self-pay | Admitting: Intensive Care

## 2020-03-18 ENCOUNTER — Other Ambulatory Visit: Payer: Self-pay

## 2020-03-18 ENCOUNTER — Emergency Department: Payer: Medicare Other

## 2020-03-18 DIAGNOSIS — R609 Edema, unspecified: Secondary | ICD-10-CM | POA: Diagnosis not present

## 2020-03-18 DIAGNOSIS — R002 Palpitations: Secondary | ICD-10-CM | POA: Diagnosis not present

## 2020-03-18 DIAGNOSIS — J90 Pleural effusion, not elsewhere classified: Secondary | ICD-10-CM | POA: Diagnosis not present

## 2020-03-18 DIAGNOSIS — I2510 Atherosclerotic heart disease of native coronary artery without angina pectoris: Secondary | ICD-10-CM | POA: Insufficient documentation

## 2020-03-18 DIAGNOSIS — I10 Essential (primary) hypertension: Secondary | ICD-10-CM | POA: Insufficient documentation

## 2020-03-18 DIAGNOSIS — R600 Localized edema: Secondary | ICD-10-CM | POA: Diagnosis not present

## 2020-03-18 LAB — CBC
HCT: 33.3 % — ABNORMAL LOW (ref 36.0–46.0)
Hemoglobin: 11 g/dL — ABNORMAL LOW (ref 12.0–15.0)
MCH: 30.8 pg (ref 26.0–34.0)
MCHC: 33 g/dL (ref 30.0–36.0)
MCV: 93.3 fL (ref 80.0–100.0)
Platelets: 202 10*3/uL (ref 150–400)
RBC: 3.57 MIL/uL — ABNORMAL LOW (ref 3.87–5.11)
RDW: 12.9 % (ref 11.5–15.5)
WBC: 5.7 10*3/uL (ref 4.0–10.5)
nRBC: 0 % (ref 0.0–0.2)

## 2020-03-18 LAB — TROPONIN I (HIGH SENSITIVITY)
Troponin I (High Sensitivity): 6 ng/L (ref <18)
Troponin I (High Sensitivity): 16 ng/L (ref <18)

## 2020-03-18 LAB — BASIC METABOLIC PANEL
Anion gap: 12 (ref 5–15)
BUN: 23 mg/dL (ref 8–23)
CO2: 22 mmol/L (ref 22–32)
Calcium: 9.9 mg/dL (ref 8.9–10.3)
Chloride: 107 mmol/L (ref 98–111)
Creatinine, Ser: 1.39 mg/dL — ABNORMAL HIGH (ref 0.44–1.00)
GFR calc Af Amer: 42 mL/min — ABNORMAL LOW (ref >60)
GFR calc non Af Amer: 36 mL/min — ABNORMAL LOW (ref >60)
Glucose, Bld: 116 mg/dL — ABNORMAL HIGH (ref 70–99)
Potassium: 4 mmol/L (ref 3.5–5.1)
Sodium: 141 mmol/L (ref 135–145)

## 2020-03-18 LAB — PROTIME-INR
INR: 1.2 (ref 0.8–1.2)
Prothrombin Time: 14.5 seconds (ref 11.4–15.2)

## 2020-03-18 IMAGING — CR DG CHEST 2V
1 series · 2 of 2 positions shown · non-contrast
Comparison: [DATE]

CLINICAL DATA: Cardiac palpitations

EXAM:
CHEST - 2 VIEW

[Series 1: dg chest 2 view · 0.14mm/px · 2 of 2 slices shown]
[im 1/2]
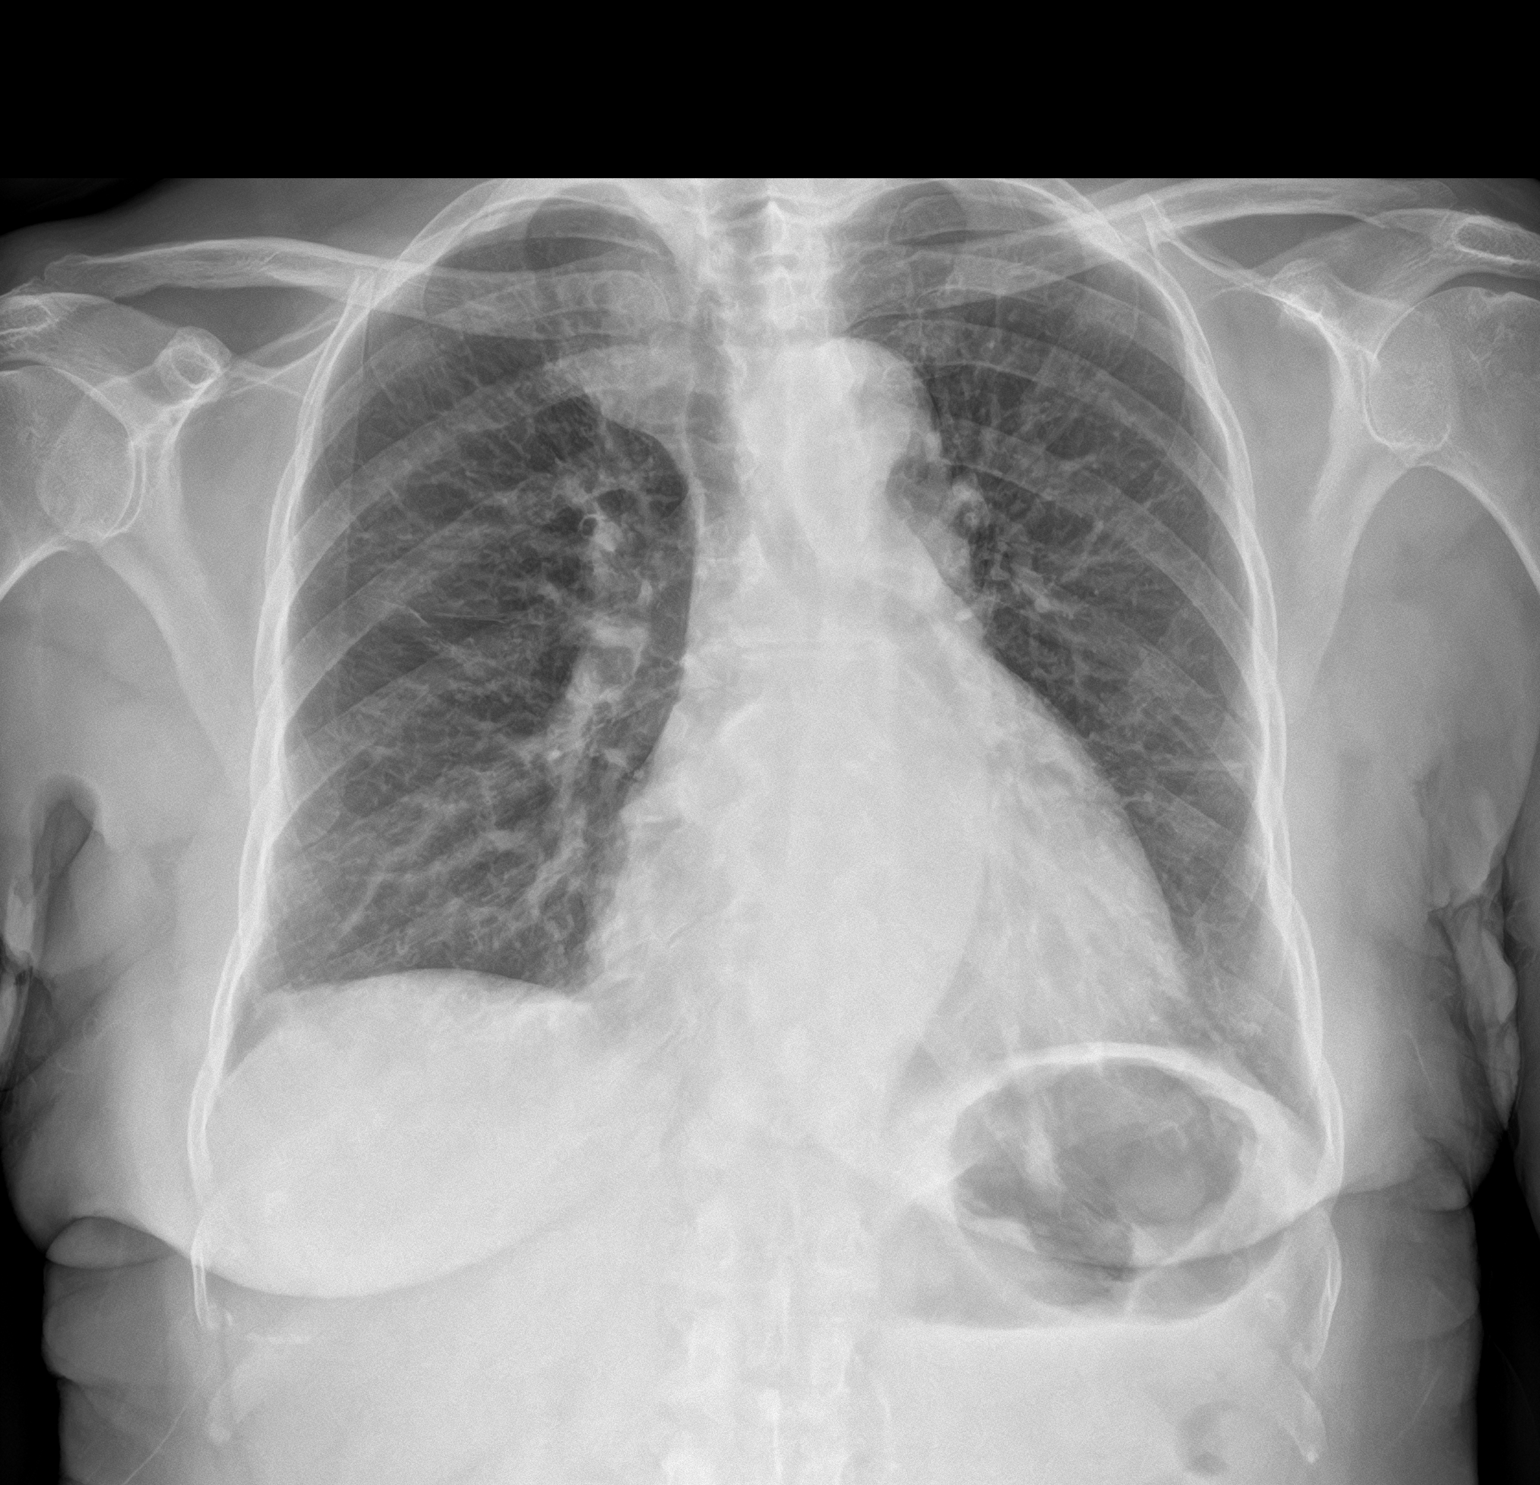
[im 2/2]
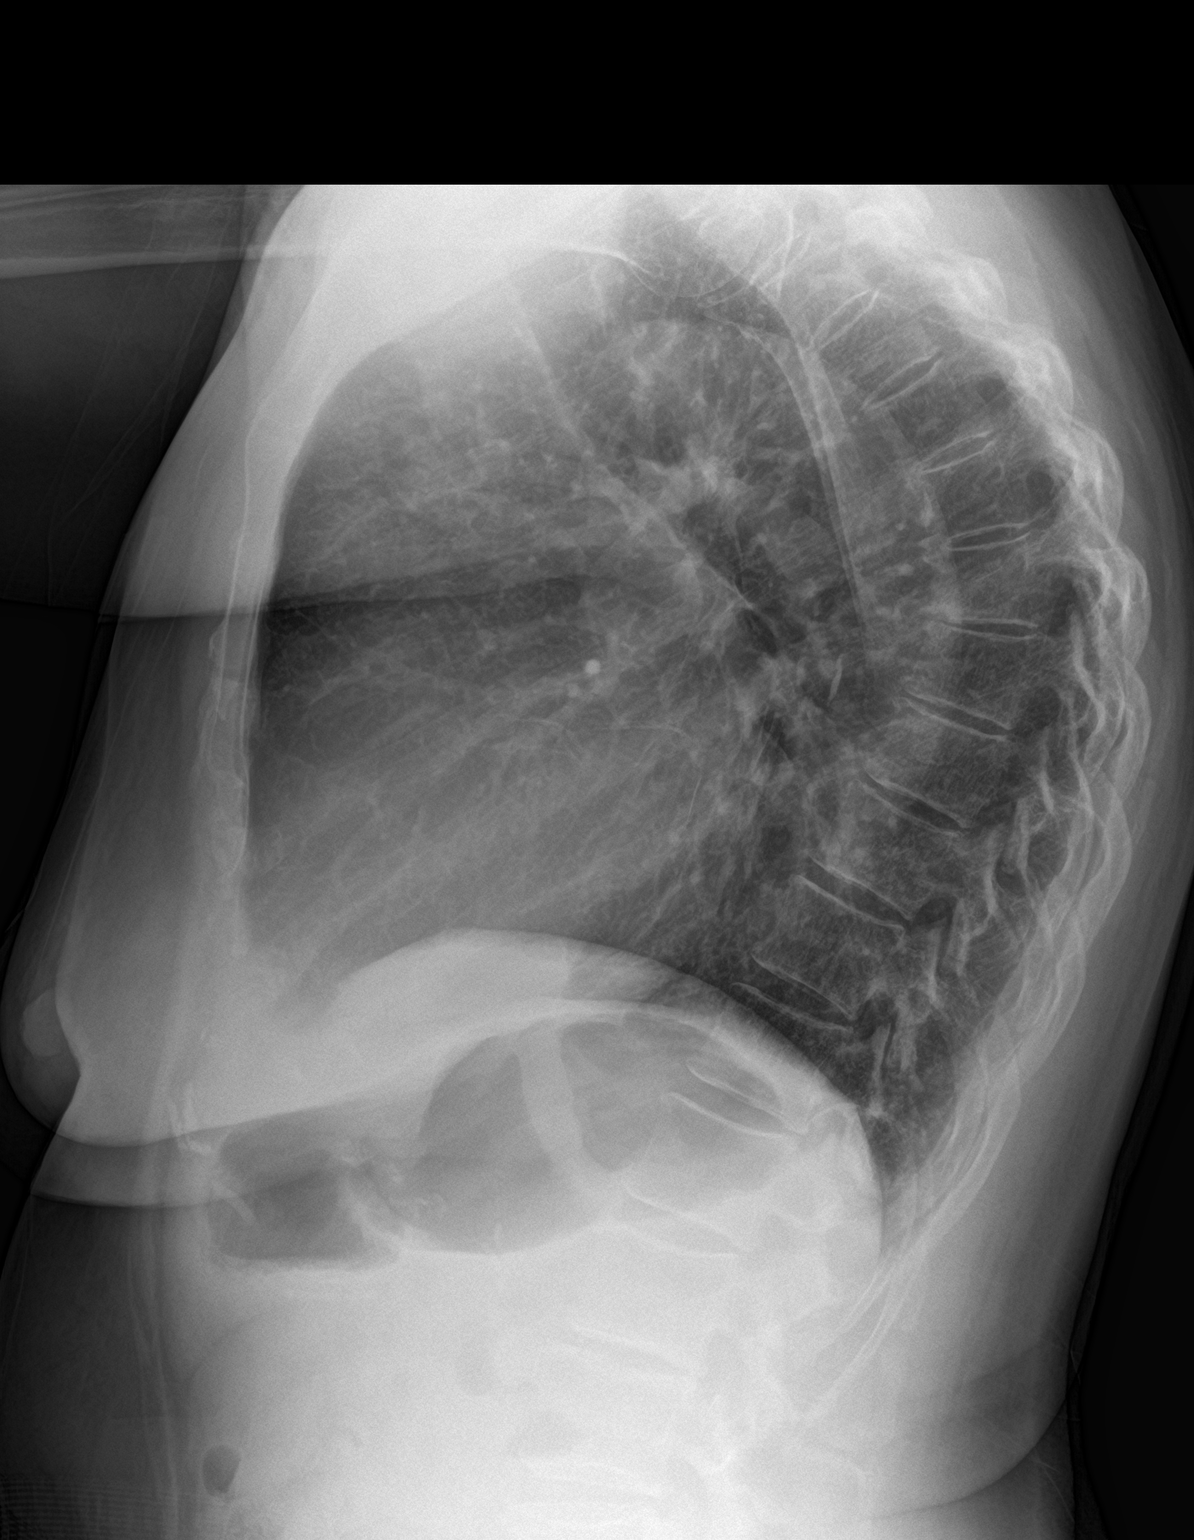

[2 of 2 positions shown; findings below may reference images not displayed]

FINDINGS: Cardiac shadow is stable. Lungs are well aerated bilaterally. No
focal infiltrate or sizable effusion is seen. No bony abnormality is
noted. Prominent soft tissue is noted to the right of the trachea on
the frontal film consistent with prominent tortuous vasculature as
previously seen on CT examination from [[facility]].
IMPRESSION: No acute abnormality noted.

## 2020-03-18 MED ORDER — FUROSEMIDE 20 MG PO TABS
10.0000 mg | ORAL_TABLET | Freq: Every day | ORAL | 0 refills | Status: DC
Start: 2020-03-18 — End: 2020-04-23

## 2020-03-18 NOTE — ED Provider Notes (Addendum)
ER Provider Note       Time seen: 4:33 PM    I have reviewed the vital signs and the nursing notes.  HISTORY   Chief Complaint Palpitations    HPI [name] is a 79 y.o. female with a history of anemia, coronary disease, hyperlipidemia, hypertension, paroxysmal atrial fibrillation, rheumatoid arthritis who presents today for palpitations and nausea that occurred while she was exercising.  The symptoms have subsequently resolved.  She presents for further evaluation.  She denies recent illness or other complaints.  Past Medical History:  Diagnosis Date  . Anemia   . Coronary artery disease, non-occlusive    a. cath 2/09: no CAD, EF 70%  . HYPERLIPIDEMIA   . HYPERTENSION   . Mild Aortic insufficiency   . Moderate mitral regurgitation   . Moderate tricuspid regurgitation   . OSTEOPENIA   . PAF (paroxysmal atrial fibrillation) ([place])    a. s/p ablation 2013; b. CHADS2VASc -> 4 (HTN, age x 2, female)-->Eliquis.  Marland Kitchen PAT (paroxysmal atrial tachycardia) ([place])    a. 04/2019 Zio: Occas PACs and rare PVCs. 21 atrial runs - longest 20 beats, max rate 169.  Marland Kitchen Pneumonia 2009  . RA (rheumatoid arthritis) ([place])   . Sinus Bradycardia    a. asymptomatic but prevents use of AVN blocking agents; b. 04/2019 Zio: Avg HR 61 (37-109).  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 Valvular heart disease    a. 05/2019 Echo: EF 60-65%. DD. RVSP 41.34mmHg. Mod dil LA. Mod MR/TR. Mild AI.    Past Surgical History:  Procedure Laterality Date  . ABLATION OF DYSRHYTHMIC FOCUS    . CARDIAC CATHETERIZATION    . COLONOSCOPY  04/18/08  . Partial hysterectomy--1979    . Thoracentesis   12/20/07      Allergies Amlodipine, Celecoxib, and Tramadol  Review of Systems Constitutional: Negative for fever. Cardiovascular: Negative for chest pain.  Positive for palpitations Respiratory: Negative for shortness of breath. Gastrointestinal: Negative for abdominal pain, positive for nausea Musculoskeletal: Negative  for back pain. Skin: Negative for rash. Neurological: Negative for headaches, focal weakness or numbness.  All systems negative/normal/unremarkable except as stated in the HPI  ____________________________________________   PHYSICAL EXAM:  VITAL SIGNS: Vitals:   03/18/20 1340 03/18/20 1538  BP: 140/63 (!) 152/59  Pulse: 67 63  Resp: 16 18  Temp: 98.4 F (36.9 C) 98.9 F (37.2 C)  SpO2: 100% 99%    Constitutional: Alert and oriented. Well appearing and in no distress. Eyes: Conjunctivae are normal. Normal extraocular movements. ENT      Head: Normocephalic and atraumatic.      Nose: No congestion/rhinnorhea.      Mouth/Throat: Mucous membranes are moist.      Neck: No stridor. Cardiovascular: Normal rate, regular rhythm.  Systolic murmur is noted Respiratory: Normal respiratory effort without tachypnea nor retractions. Breath sounds are clear and equal bilaterally. No wheezes/rales/rhonchi. Gastrointestinal: Soft and nontender. Normal bowel sounds Musculoskeletal: Nontender with normal range of motion in extremities. No lower extremity tenderness nor edema. Neurologic:  Normal speech and language. No gross focal neurologic deficits are appreciated.  Skin:  Skin is warm, dry and intact. No rash noted. Psychiatric: Speech and behavior are normal.  ____________________________________________  EKG: Interpreted by me.  Sinus rhythm with rate of 79 bpm, normal PR interval, normal QRS, normal QT  ____________________________________________   LABS (pertinent positives/negatives)  Labs Reviewed  BASIC METABOLIC PANEL - Abnormal; Notable for the following components:      Result  Value   Glucose, Bld 116 (*)    Creatinine, Ser 1.39 (*)    GFR calc non Af Amer 36 (*)    GFR calc Af Amer 42 (*)    All other components within normal limits  CBC - Abnormal; Notable for the following components:   RBC 3.57 (*)    Hemoglobin 11.0 (*)    HCT 33.3 (*)    All other components  within normal limits  PROTIME-INR  TROPONIN I (HIGH SENSITIVITY)  TROPONIN I (HIGH SENSITIVITY)    RADIOLOGY  Images were viewed by me Chest x-ray is unremarkable  DIFFERENTIAL DIAGNOSIS  Paroxysmal atrial fibrillation, arrhythmia, MI, unstable angina  ASSESSMENT AND PLAN  Palpitations, peripheral edema   Plan: The patient had presented for what could be paroxysmal atrial tachycardia or fibrillation which she has had in the past. Patient's labs have not revealed any acute process.  She is currently asymptomatic and is back in a sinus rhythm.  She has describes some peripheral edema we will try low-dose Lasix for several days but is otherwise cleared for outpatient follow-up.  Lenise Arena MD    Note: This note was generated in part or whole with voice recognition software. Voice recognition is usually quite accurate but there are transcription errors that can and very often do occur. I apologize for any typographical errors that were not detected and corrected.     Earleen Newport, MD 03/18/20 1640    Earleen Newport, MD 03/18/20 [phone]

## 2020-03-18 NOTE — ED Notes (Signed)
Pt provided with niece's phone number given by unit secretary.

## 2020-03-18 NOTE — ED Triage Notes (Signed)
Patient reports while working out she started feeling palpitations and feeling nauseated. Denies pain

## 2020-03-24 ENCOUNTER — Ambulatory Visit (INDEPENDENT_AMBULATORY_CARE_PROVIDER_SITE_OTHER): Payer: Medicare Other | Admitting: Pharmacist

## 2020-03-24 ENCOUNTER — Encounter: Payer: Self-pay | Admitting: Pharmacist

## 2020-03-24 ENCOUNTER — Other Ambulatory Visit: Payer: Self-pay

## 2020-03-24 VITALS — BP 136/76 | HR 60 | Temp 98.1°F | Ht 63.0 in | Wt 150.8 lb

## 2020-03-24 DIAGNOSIS — Z79899 Other long term (current) drug therapy: Secondary | ICD-10-CM | POA: Diagnosis not present

## 2020-03-24 DIAGNOSIS — I10 Essential (primary) hypertension: Secondary | ICD-10-CM

## 2020-03-24 MED ORDER — HYDRALAZINE HCL 50 MG PO TABS: 50.0000 mg | ORAL_TABLET | Freq: Two times a day (BID) | ORAL | 1 refills | Status: DC

## 2020-03-24 MED ORDER — HYDROCHLOROTHIAZIDE 25 MG PO TABS: 12.5000 mg | ORAL_TABLET | Freq: Every day | ORAL | 4 refills | Status: DC

## 2020-03-24 MED ORDER — LOSARTAN POTASSIUM 100 MG PO TABS: 100.0000 mg | ORAL_TABLET | Freq: Every day | ORAL | 1 refills | Status: DC

## 2020-03-24 NOTE — Assessment & Plan Note (Addendum)
Blood pressure remains above goal but fairly controlled today. Patient stopped taking HCTZ about a month ago d/t confusion after medication adjustment. Noticed increase LEE in the last 3 weeks. She also admits need to decrease dietary sodium. Denies any additional problems with palpitations. No experiencing dizziness, pain, or blurry vision. Patient has 1 more dose of furosemide available, then will stop lop diuretic.  Will resume HCTZ at 12.5mg  daily, continue losartan 100mg , and decrease hydralazine to 50mg  BID. Plan to repeat BMET in 3 weeks, and follow up at HTN clinic in 4 weeks. Will change losartan 100mg  to candesartan 32mg  daily if renal function remains stable ,but aditional BP control needed. Can consider chlorthalidone 12.5mg  as an alternative to HCTZ as well.   Patient is to keep BP records, decrease sodium in diet, and resume exercise 3 time per week if possible.

## 2020-03-24 NOTE — Patient Instructions (Addendum)
Return for a  follow up appointment in  4 weeks  Go to the lab in 3 weeks  Check your blood pressure at home daily (if able) and keep record of the readings.  Take your BP meds as follows: *RESUME hydrochlorothiazide at 12.5mg  daily *DECREASE hydralazine to 50mg  twice daily* *CONTINUE all other medication without changes  Bring all of your meds, your BP cuff and your record of home blood pressures to your next appointment.  Exercise as you're able, try to walk approximately 30 minutes per day.  Keep salt intake to a minimum, especially watch canned and prepared boxed foods.  Eat more fresh fruits and vegetables and fewer canned items.  Avoid eating in fast food restaurants.    HOW TO TAKE YOUR BLOOD PRESSURE: . Rest 5 minutes before taking your blood pressure. .  Don't smoke or drink caffeinated beverages for at least 30 minutes before. . Take your blood pressure before (not after) you eat. . Sit comfortably with your back supported and both feet on the floor (don't cross your legs). . Elevate your arm to heart level on a table or a desk. . Use the proper sized cuff. It should fit smoothly and snugly around your bare upper arm. There should be enough room to slip a fingertip under the cuff. The bottom edge of the cuff should be 1 inch above the crease of the elbow. . Ideally, take 3 measurements at one sitting and record the average.

## 2020-03-24 NOTE — Progress Notes (Signed)
Patient ID: [name]                 DOB: 1940-12-06                      MRN: [id number]     HPI:  [name] is a 79 y.o. female referred by Dr. Oval Linsey to pharmacist follow up as part of HTN clinic. PMH includes persistent A.Fib s/p ablation, hypertension, bradycardia, CKD-2, mitral regusgitation, and hyperlipidemia. EF 60-65% per ECHO performed August/2020. Denies pain, dizziness,or lightheaded. Reports some increased SOB since Friday and unable to go back to the gym since. Palpitations resolved after ED visitn on 03/18/2020. Noted significant lowe extremity edema (bilateral).   Patient has 2 more dose of furosemide 10mg  left at home. Still taking hydralazine 50mg  TID , but stopped taking HCTZ some time ago for unknown reason.    Current HTN meds:  Losartan 100mg  daily HCTZ 12.5mg  daily Furosemide 10mg  daily x 5 days (03-18-2020) for low etremity edema  Previously tried:  Hydralazine - D/C by PCP d/t LEE  BP goal: 130/80 (okay 140/90 if unable to tolerate 130/80)  Family History: family history includes Atrial fibrillation in her brother; Breast cancer (age of onset: 60) in her paternal aunt; Diabetes in an other family member; Heart attack in her brother; Heart disease in her brother and mother; Pancreatic cancer in her father.  Social History: denies alcohol or tobacco use  Diet:  admits need to decrease sodium and fat in diet  Exercise: [employer] 3days a week. - ON HOLD since Friday d/t not feeling well  Home BP readings: (no BP cuff available for assessment) No readings for this month but records from April Range 93/55 to 135/68 with more significant drop in the evenings  Wt Readings from Last 3 Encounters:  03/24/20 150 lb 12.8 oz (68.4 kg)  03/18/20 150 lb (68 kg)  03/16/20 153 lb (69.4 kg)   BP Readings from Last 3 Encounters:  03/24/20 136/76  03/18/20 (!) 152/59  03/16/20 128/74   Pulse Readings from Last 3 Encounters:    03/24/20 60  03/18/20 (!) 52  03/16/20 69    Renal function: Estimated Creatinine Clearance: 31.5 mL/min (A) (by C-G formula based on SCr of 1.39 mg/dL (H)).  Past Medical History:  Diagnosis Date   Anemia    Coronary artery disease, non-occlusive    a. cath 2/09: no CAD, EF 70%   HYPERLIPIDEMIA    HYPERTENSION    Mild Aortic insufficiency    Moderate mitral regurgitation    Moderate tricuspid regurgitation    OSTEOPENIA    PAF (paroxysmal atrial fibrillation) ([place])    a. s/p ablation 2013; b. CHADS2VASc -> 4 (HTN, age x 2, female)-->Eliquis.   PAT (paroxysmal atrial tachycardia) ([place])    a. 04/2019 Zio: Occas PACs and rare PVCs. 21 atrial runs - longest 20 beats, max rate 169.   Pneumonia 2009   RA (rheumatoid arthritis) ([facility])    Sinus Bradycardia    a. asymptomatic but prevents use of AVN blocking agents; b. 04/2019 Zio: Avg HR 61 (37-109).   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 05/2019 Echo: EF 60-65%. DD. RVSP 41.80mmHg. Mod dil LA. Mod MR/TR. Mild AI.    Current Outpatient Medications on File Prior to Visit  Medication Sig Dispense Refill   cholecalciferol (VITAMIN D3) 25 MCG (1000 UNIT) tablet Take 1,000 Units by mouth daily.  ELIQUIS 5 MG TABS tablet TAKE ONE TABLET BY MOUTH TWICE DAILY 180 tablet 2   B Complex Vitamins (VITAMIN B COMPLEX) TABS Take 1 tablet by mouth daily.      furosemide (LASIX) 20 MG tablet Take 0.5 tablets (10 mg total) by mouth daily for 5 days. 2.5 tablet 0   No current facility-administered medications on file prior to visit.    Allergies  Allergen Reactions   Amlodipine     Ankle swelling   Celecoxib Other (See Comments)    Tachycardia/palpitations   Tramadol Nausea Only    Blood pressure 136/76, pulse 60, temperature 98.1 F (36.7 C), height 5\' 3"  (1.6 m), weight 150 lb 12.8 oz (68.4 kg), SpO2 97 %.  Essential hypertension Blood pressure remains above goal but fairly controlled today.  Patient stopped taking HCTZ about a month ago d/t confusion after medication adjustment. Noticed increase LEE in the last 3 weeks. She also admits need to decrease dietary sodium. Denies any additional problems with palpitations. No experiencing dizziness, pain, or blurry vision. Patient has 1 more dose of furosemide available, then will stop lop diuretic.  Will resume HCTZ at 12.5mg  daily, continue losartan 100mg , and decrease hydralazine to 50mg  BID. Plan to repeat BMET in 3 weeks, and follow up at HTN clinic in 4 weeks. Will change losartan 100mg  to candesartan 32mg  daily if renal function remains stable ,but aditional BP control needed. Can consider chlorthalidone 12.5mg  as an alternative to HCTZ as well.   Patient is to keep BP records, decrease sodium in diet, and resume exercise 3 time per week if possible.    Bernon Arviso Rodriguez-Guzman PharmD, BCPS, [place] [address] 03/24/2020 5:32 PM

## 2020-03-31 ENCOUNTER — Telehealth: Payer: Self-pay | Admitting: *Deleted

## 2020-03-31 NOTE — Telephone Encounter (Signed)
Follow up appointment made with Dr Oval Linsey

## 2020-03-31 NOTE — Telephone Encounter (Signed)
-----   Message from [name], MD sent at 03/27/2020  9:33 AM EDT ----- Regarding: RE: Change provider OK ----- Message ----- From: Nelva Bush, MD Sent: 03/27/2020   8:15 AM EDT To: [name], MD, Earvin Hansen, LPN, # Subject: RE: Change provider                            That is fine with me.  Chris End ----- Message ----- From: Harrington Challenger, RPH-CPP Sent: 03/27/2020   8:02 AM EDT To: Nelva Bush, MD, [name], MD, # Subject: Change provider                                Patient will like to change all cardiologist services to [facility] office and will like to change primary cardiologist from Dr End to Dr Oval Linsey.   Please follow up  Thanks Raquel Rodriguez-Guzman PharmD, BCPS, [place] [address] [place], [address] 03/27/2020 8:04 AM

## 2020-04-01 DIAGNOSIS — H40052 Ocular hypertension, left eye: Secondary | ICD-10-CM | POA: Diagnosis not present

## 2020-04-08 ENCOUNTER — Ambulatory Visit: Payer: Medicare Other | Admitting: Internal Medicine

## 2020-04-22 ENCOUNTER — Other Ambulatory Visit
Admission: RE | Admit: 2020-04-22 | Discharge: 2020-04-22 | Disposition: A | Discharge status: Home / Self Care | Payer: Medicare Other | Attending: Cardiovascular Disease | Admitting: Cardiovascular Disease

## 2020-04-22 DIAGNOSIS — I10 Essential (primary) hypertension: Secondary | ICD-10-CM | POA: Diagnosis not present

## 2020-04-22 DIAGNOSIS — Z79899 Other long term (current) drug therapy: Secondary | ICD-10-CM | POA: Diagnosis not present

## 2020-04-22 LAB — BASIC METABOLIC PANEL
Anion gap: 11 (ref 5–15)
BUN: 28 mg/dL — ABNORMAL HIGH (ref 8–23)
CO2: 22 mmol/L (ref 22–32)
Calcium: 9.4 mg/dL (ref 8.9–10.3)
Chloride: 108 mmol/L (ref 98–111)
Creatinine, Ser: 1.35 mg/dL — ABNORMAL HIGH (ref 0.44–1.00)
GFR calc Af Amer: 44 mL/min — ABNORMAL LOW (ref >60)
GFR calc non Af Amer: 38 mL/min — ABNORMAL LOW (ref >60)
Glucose, Bld: 118 mg/dL — ABNORMAL HIGH (ref 70–99)
Potassium: 4.1 mmol/L (ref 3.5–5.1)
Sodium: 141 mmol/L (ref 135–145)

## 2020-04-23 ENCOUNTER — Ambulatory Visit (INDEPENDENT_AMBULATORY_CARE_PROVIDER_SITE_OTHER): Payer: Medicare Other | Admitting: Pharmacist Clinician (PhC)/ Clinical Pharmacy Specialist

## 2020-04-23 ENCOUNTER — Other Ambulatory Visit: Payer: Self-pay

## 2020-04-23 DIAGNOSIS — I10 Essential (primary) hypertension: Secondary | ICD-10-CM | POA: Diagnosis not present

## 2020-04-23 NOTE — Patient Instructions (Addendum)
In about a month give your home BP log to [facility] and she will bring into the office  Check your blood pressure at home daily and keep record of the readings. In about a month give your home BP log to [place] and she will bring into the office.  Take your BP meds as follows:  Continue with all current medications  Bring all of your meds, your BP cuff and your record of home blood pressures to your next appointment.  Exercise as you're able, try to walk approximately 30 minutes per day.  Keep salt intake to a minimum, especially watch canned and prepared boxed foods.  Eat more fresh fruits and vegetables and fewer canned items.  Avoid eating in fast food restaurants.    HOW TO TAKE YOUR BLOOD PRESSURE: . Rest 5 minutes before taking your blood pressure. .  Don't smoke or drink caffeinated beverages for at least 30 minutes before. . Take your blood pressure before (not after) you eat. . Sit comfortably with your back supported and both feet on the floor (don't cross your legs). . Elevate your arm to heart level on a table or a desk. . Use the proper sized cuff. It should fit smoothly and snugly around your bare upper arm. There should be enough room to slip a fingertip under the cuff. The bottom edge of the cuff should be 1 inch above the crease of the elbow. . Ideally, take 3 measurements at one sitting and record the average.

## 2020-04-23 NOTE — Progress Notes (Signed)
Patient ID: [name]                 DOB: 1940/12/12                      MRN: [id number]     HPI:  [name] is a 79 y.o. female referred by Dr. Oval Linsey to pharmacist follow up as part of HTN clinic. PMH includes persistent A.Fib s/p ablation, hypertension, bradycardia, CKD-2, mitral regusgitation, and hyperlipidemia. EF 60-65% per ECHO performed August 2020.  At her last visit she was doing well, although had some increased SOB and bilateral LEE.  Her hydralazine was cut back to twice daily and hydrochlorothiazide was re-started.   She returns today for follow up, accompanied by her daughter and granddaughter Designer, fashion/clothing in our office)     Current HTN meds:  Losartan 100mg  daily HCTZ 12.5mg  daily  Previously tried:  Hydralazine - D/C by PCP d/t LEE  BP goal: 130/80 (okay 140/90 if unable to tolerate 130/80)  Family History: family history includes Atrial fibrillation in her brother; Breast cancer (age of onset: 14) in her paternal aunt; Diabetes in an other family member; Heart attack in her brother; Heart disease in her brother and mother; Pancreatic cancer in her father.  Social History: denies alcohol or tobacco use  Diet:  Eats fast food about twice weekly, adds some salt at home when cooking; fresh fruits and vegetables; snacks consist of fruit or cereal  Exercise: [employer] usually goes about 3 times per week  Home BP readings: Home cuff accurate within 10 points in office today - home readings all 409-811'B systolic   Wt Readings from Last 3 Encounters:  04/23/20 148 lb (67.1 kg)  03/24/20 150 lb 12.8 oz (68.4 kg)  03/18/20 150 lb (68 kg)   BP Readings from Last 3 Encounters:  04/23/20 (!) 150/68  03/24/20 136/76  03/18/20 (!) 152/59   Pulse Readings from Last 3 Encounters:  04/23/20 67  03/24/20 60  03/18/20 (!) 52    Renal function: Estimated Creatinine Clearance: 32.1 mL/min (A) (by C-G formula based on SCr of 1.35 mg/dL  (H)).  Past Medical History:  Diagnosis Date  . Anemia   . Coronary artery disease, non-occlusive    a. cath 2/09: no CAD, EF 70%  . HYPERLIPIDEMIA   . HYPERTENSION   . Mild Aortic insufficiency   . Moderate mitral regurgitation   . Moderate tricuspid regurgitation   . OSTEOPENIA   . PAF (paroxysmal atrial fibrillation) ([place])    a. s/p ablation 2013; b. CHADS2VASc -> 4 (HTN, age x 2, female)-->Eliquis.  Marland Kitchen PAT (paroxysmal atrial tachycardia) ([place])    a. 04/2019 Zio: Occas PACs and rare PVCs. 21 atrial runs - longest 20 beats, max rate 169.  Marland Kitchen Pneumonia 2009  . RA (rheumatoid arthritis) ([place])   . Sinus Bradycardia    a. asymptomatic but prevents use of AVN blocking agents; b. 04/2019 Zio: Avg HR 61 (37-109).  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 Valvular heart disease    a. 05/2019 Echo: EF 60-65%. DD. RVSP 41.8mmHg. Mod dil LA. Mod MR/TR. Mild AI.    Current Outpatient Medications on File Prior to Visit  Medication Sig Dispense Refill  . B Complex Vitamins (VITAMIN B COMPLEX) TABS Take 1 tablet by mouth daily.     . cholecalciferol (VITAMIN D3) 25 MCG (1000 UNIT) tablet Take 1,000 Units by mouth daily.    Marland Kitchen ELIQUIS 5  MG TABS tablet TAKE ONE TABLET BY MOUTH TWICE DAILY 180 tablet 2  . hydrALAZINE (APRESOLINE) 50 MG tablet Take 1 tablet (50 mg total) by mouth in the morning and at bedtime. 60 tablet 1  . hydrochlorothiazide (HYDRODIURIL) 25 MG tablet Take 0.5 tablets (12.5 mg total) by mouth daily. 15 tablet 4  . losartan (COZAAR) 100 MG tablet Take 1 tablet (100 mg total) by mouth daily. 90 tablet 1   No current facility-administered medications on file prior to visit.    Allergies  Allergen Reactions  . Amlodipine     Ankle swelling  . Celecoxib Other (See Comments)    Tachycardia/palpitations  . Tramadol Nausea Only    Blood pressure (!) 150/68, pulse 67, resp. rate 15, height 5\' 3"  (1.6 m), weight 148 lb (67.1 kg), SpO2 96 %.  Essential hypertension Patient home blood  pressure readings all lin range, although elevated in the office today.  Patient to continue with current medications and regular home monitoring.  In a month she will give a list of home readings to her granddaughter (RN  Elmo Putt in office) for [place] to review.  At that time we can make a determination if she will need medication adjustments.    Tommy Medal PharmD CPP [facility] [address] [place] [address] 04/27/2020 11:14 AM

## 2020-04-27 NOTE — Assessment & Plan Note (Signed)
Patient home blood pressure readings all lin range, although elevated in the office today.  Patient to continue with current medications and regular home monitoring.  In a month she will give a list of home readings to her granddaughter (RN  Elmo Putt in office) for [place] to review.  At that time we can make a determination if she will need medication adjustments.

## 2020-05-21 ENCOUNTER — Ambulatory Visit (INDEPENDENT_AMBULATORY_CARE_PROVIDER_SITE_OTHER): Payer: Medicare Other | Admitting: Family Medicine

## 2020-05-21 ENCOUNTER — Other Ambulatory Visit: Payer: Self-pay

## 2020-05-21 ENCOUNTER — Encounter: Payer: Self-pay | Admitting: Family Medicine

## 2020-05-21 VITALS — BP 130/76 | HR 48 | Temp 97.6°F | Ht 63.0 in | Wt 142.0 lb

## 2020-05-21 DIAGNOSIS — R29898 Other symptoms and signs involving the musculoskeletal system: Secondary | ICD-10-CM

## 2020-05-21 DIAGNOSIS — M25552 Pain in left hip: Secondary | ICD-10-CM | POA: Diagnosis not present

## 2020-05-21 DIAGNOSIS — G8929 Other chronic pain: Secondary | ICD-10-CM | POA: Diagnosis not present

## 2020-05-21 DIAGNOSIS — M549 Dorsalgia, unspecified: Secondary | ICD-10-CM

## 2020-05-21 NOTE — Progress Notes (Signed)
[name] T. Jacqueline Capshaw, MD, [place] at [facility] [place], [address]  Phone: [phone]  FAX: [place] - 79 y.o. female  MRN [id number]  Date of Birth: 02-22-1941  Date: 05/21/2020  PCP: [name], MD  Referral: [name], MD  Chief Complaint  Patient presents with  . Left Hip Pain    Here with husband. Started 05-16-20. No known injury. Has used OTC pain patches and Tylenol without relief.    This visit occurred during the SARS-CoV-2 public health emergency.  Safety protocols were in place, including screening questions prior to the visit, additional usage of staff PPE, and extensive cleaning of exam room while observing appropriate contact time as indicated for disinfecting solutions.   Subjective:   [name] is a 79 y.o. very pleasant female patient with Body mass index is 25.15 kg/m. who presents with the following:  She is a pleasant lady, and I have seen her a number of times over the years.  She presents now with posterior hip/pelvis pain as well as some anterior back pain.  She does not predominantly have anterior hip pain.  It feels like a dull toothache without radiculopathy, numbness, tingling.  Prior to Covid patient was going to the gym 3 times a week, but at this point she is not doing any of that.  She is limited somewhat even in terms of basic walking from her left-sided posterior pelvis pain.  She has not had any specific trauma or injury.  She does have a history of some long-term back pain that is intermittently bothering her for years  L hip pain:  L posterior  No anterior.   Tooth  Does not move.   Uses some lidocaine patches.   Was going to the gym 3 x a week  3/5 all hip L  Review of Systems is noted in the HPI, as appropriate   Objective:   BP 130/76 (BP Location: Left Arm, Patient Position: Sitting, Cuff Size:  Large)   Pulse (!) 48   Temp 97.6 F (36.4 C)   Ht 5\' 3"  (1.6 m)   Wt 142 lb (64.4 kg)   SpO2 99%   BMI 25.15 kg/m    GEN: No acute distress; alert,appropriate. PULM: Breathing comfortably in no respiratory distress PSYCH: Normally interactive.    Range of motion at  the waist: Flexion, extension, lateral bending and rotation: Mild limitation in extension and flexion to just beyond 70 degrees.  Lateral bending and rotation are grossly preserved.  No echymosis or edema Rises to examination table with mild difficulty Gait: minimally antalgic  Inspection/Deformity: N Paraspinus Tenderness: L5-S1, as well as pain in the upper buttocks as well as in the piriformis region.  B Ankle Dorsiflexion (L5,4): 5/5 B Great Toe Dorsiflexion (L5,4): 5/5 Heel Walk (L5): WNL Toe Walk (S1): WNL Rise/Squat (L4): WNL, mild pain  SENSORY B Medial Foot (L4): WNL B Dorsum (L5): WNL B Lateral (S1): WNL Light Touch: WNL Pinprick: WNL  REFLEXES Knee (L4): 2+ Ankle (S1): 2+   HIP EXAM: SIDE: Left ROM: Abduction, Flexion, Internal and External range of motion: Modest restriction of motion Pain with terminal IROM and EROM: Minimal GTB: NT SLR: NEG Knees: No effusion FABER: NT REVERSE FABER: NT, neg Piriformis: Tender to direct palpation  Str: flexion: 2+/5 abduction: 3/5 adduction: 2+/5 Strength testing non-tender    Radiology: CLINICAL DATA:  Assess for osteoarthritis.  EXAM: DG HIP (WITH OR WITHOUT PELVIS) 2-3V LEFT  COMPARISON:  None.  FINDINGS: No acute fracture. No dislocation. Hip joint spaces are well maintained. Minimal juxta-articular sclerosis in the superior left acetabulum. There is moderate juxta-articular sclerosis about the SI joints bilaterally left greater than right compatible with osteoarthritic change. Bone mineral density is within normal limits. No erosive changes.  IMPRESSION: Minimal degenerative change of the left hip joint.  Moderate degenerative change of the SI joints left greater than right. No acute bony pathology.   Electronically Signed   By: Marybelle Killings M.D.   On: 01/21/2019 14:50  Assessment and Plan:     ICD-10-CM   1. Weakness of left hip  R29.898   2. Chronic hip pain, left  M25.552    G89.29   3. Chronic back pain greater than 3 months duration  M54.9    G89.29    Total encounter time: 30 minutes. This includes total time spent on the day of encounter.  This includes additional time for independent review of the patient's x-rays again, as well as chart review, lab review, and notes from primary care doctor.  She has quite remarkable weakness at her hip.  She is unable to resist even gravity in some directions.  This absolutely impacts her posterior pelvis pain as well as her back.  Her prior back pain plays a role here as well and both of them are exacerbating 1 another.  I do not think that she will have significant relief of symptoms until her strength improves.  I gave her a dedicated hip rehab program, and with her symptoms are persistent and she has difficulty doing her rehab on a run then she is to call me and I will refer her for formal physical therapy.  Follow-up: Return in about 1 month (around 06/21/2020).  No orders of the defined types were placed in this encounter.  There are no discontinued medications. No orders of the defined types were placed in this encounter.   Signed,  Maud Deed. Niya Behler, MD   Outpatient Encounter Medications as of 05/21/2020  Medication Sig  . B Complex Vitamins (VITAMIN B COMPLEX) TABS Take 1 tablet by mouth daily.   . cholecalciferol (VITAMIN D3) 25 MCG (1000 UNIT) tablet Take 1,000 Units by mouth daily.  Marland Kitchen ELIQUIS 5 MG TABS tablet TAKE ONE TABLET BY MOUTH TWICE DAILY  . hydrALAZINE (APRESOLINE) 50 MG tablet Take 1 tablet (50 mg total) by mouth in the morning and at bedtime.  . hydrochlorothiazide (HYDRODIURIL) 25 MG tablet Take 0.5 tablets  (12.5 mg total) by mouth daily.  Marland Kitchen losartan (COZAAR) 100 MG tablet Take 1 tablet (100 mg total) by mouth daily.   No facility-administered encounter medications on file as of 05/21/2020.

## 2020-05-24 ENCOUNTER — Encounter: Payer: Self-pay | Admitting: Family Medicine

## 2020-05-29 ENCOUNTER — Ambulatory Visit: Payer: Medicare Other | Admitting: Cardiovascular Disease

## 2020-05-29 ENCOUNTER — Other Ambulatory Visit: Payer: Self-pay

## 2020-05-29 ENCOUNTER — Encounter: Payer: Self-pay | Admitting: Cardiovascular Disease

## 2020-05-29 VITALS — BP 130/60 | HR 60 | Temp 97.8°F | Ht 63.0 in | Wt 144.0 lb

## 2020-05-29 DIAGNOSIS — I350 Nonrheumatic aortic (valve) stenosis: Secondary | ICD-10-CM

## 2020-05-29 DIAGNOSIS — I4819 Other persistent atrial fibrillation: Secondary | ICD-10-CM | POA: Diagnosis not present

## 2020-05-29 DIAGNOSIS — E782 Mixed hyperlipidemia: Secondary | ICD-10-CM

## 2020-05-29 DIAGNOSIS — I10 Essential (primary) hypertension: Secondary | ICD-10-CM

## 2020-05-29 DIAGNOSIS — I2510 Atherosclerotic heart disease of native coronary artery without angina pectoris: Secondary | ICD-10-CM | POA: Diagnosis not present

## 2020-05-29 NOTE — Progress Notes (Signed)
Hypertension Clinic Follow up    Date:  05/29/2020   ID:  [name], DOB Jul 16, 1941, MRN [id number]  PCP:  [name], MD  Cardiologist:  [name], MD  Nephrologist:  Referring MD: [name], MD   CC: Hypertension  History of Present Illness:    [name] is a 79 y.o. female with a hx of persistent atrial fibrillation status post ablation, bradycardia, CKD 2, mitral regurgitation, aortic regurgitation, hypertension, and hyperlipidemia here for follow up. She had an echo 05/14/2019 that revealed LVEF 60 to 65% with grade 2 diastolic dysfunction and mildly elevated pulmonary pressures.  She was initially seen 3/202 to establish care in the hypertension clinic.  She was first diagnosed with hypertension many decades ago.  In the past it was well-controlled.  However in the last year she has struggled.  Lately her blood pressure has been in the 160s at home.  She last saw [name] on 11/22/2019.  Prior to that losartan was increased to 50 mg.  Her blood pressure remained above goal since making that change.  [name] increased losartan to 100mg .  She followed up with [name], NP, and hydralazine 25mg  bid was added on 2/26.  At her first visit hydralazine was increased.  She saw [name], NP on 12/2019 an dit was increased to 75mg .  She started to work out at the [employer].  She was seen in the ED with palpitations 03/2020.  She was in sinus rhythm at the time.  This has resolved.   It was noted that she had inadvertently stopped taking her hydrochlorothiazide.  She this was resumed when she saw her pharmacist on 03/2020.  Hydralazine was reduced back to 50 mg twice daily.  Since then her BP has been well-controlled.  She has been struggling with pain in her leg.  When the pain is there her blood pressure is higher.  She injured it at the exercise program at [facility] which she really likes.  She is doing some physical therapy and this seems to be helping.  She has no  exertional chest pain and her breathing is stable.  She denies lower extremity edema, orthopnea, or PND.  Previous antihypertensives: Amlodipine- ankle swelling Spironolactone- unsure why it was stopped  Past Medical History:  Diagnosis Date  . Anemia   . Coronary artery disease, non-occlusive    a. cath 2/09: no CAD, EF 70%  . HYPERLIPIDEMIA   . HYPERTENSION   . Mild Aortic insufficiency   . Moderate mitral regurgitation   . Moderate tricuspid regurgitation   . OSTEOPENIA   . PAF (paroxysmal atrial fibrillation) ([place])    a. s/p ablation 2013; b. CHADS2VASc -> 4 (HTN, age x 2, female)-->Eliquis.  Marland Kitchen PAT (paroxysmal atrial tachycardia) ([place])    a. 04/2019 Zio: Occas PACs and rare PVCs. 21 atrial runs - longest 20 beats, max rate 169.  Marland Kitchen Pneumonia 2009  . RA (rheumatoid arthritis) ([place])   . Sinus Bradycardia    a. asymptomatic but prevents use of AVN blocking agents; b. 04/2019 Zio: Avg HR 61 (37-109).  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 Valvular heart disease    a. 05/2019 Echo: EF 60-65%. DD. RVSP 41.65mmHg. Mod dil LA. Mod MR/TR. Mild AI.    Past Surgical History:  Procedure Laterality Date  . ABLATION OF DYSRHYTHMIC FOCUS    . CARDIAC CATHETERIZATION    . COLONOSCOPY  04/18/08  . Partial hysterectomy--1979    . Thoracentesis  12/20/07      Current Medications: Current Meds  Medication Sig  . B Complex Vitamins (VITAMIN B COMPLEX) TABS Take 1 tablet by mouth daily.   . cholecalciferol (VITAMIN D3) 25 MCG (1000 UNIT) tablet Take 1,000 Units by mouth daily.  Marland Kitchen ELIQUIS 5 MG TABS tablet TAKE ONE TABLET BY MOUTH TWICE DAILY  . hydrALAZINE (APRESOLINE) 50 MG tablet Take 1 tablet (50 mg total) by mouth in the morning and at bedtime.  . hydrochlorothiazide (HYDRODIURIL) 25 MG tablet Take 0.5 tablets (12.5 mg total) by mouth daily.  Marland Kitchen losartan (COZAAR) 100 MG tablet Take 1 tablet (100 mg total) by mouth daily.  Marland Kitchen LUMIGAN 0.01 % SOLN      Allergies:   Amlodipine, Celecoxib, and  Tramadol   Social History   Socioeconomic History  . Marital status: Widowed    Spouse name: Not on file  . Number of children: Not on file  . Years of education: Not on file  . Highest education level: Not on file  Occupational History  . Not on file  Tobacco Use  . Smoking status: Never Smoker  . Smokeless tobacco: Never Used  Vaping Use  . Vaping Use: Never used  Substance and Sexual Activity  . Alcohol use: No    Alcohol/week: 0.0 standard drinks  . Drug use: No  . Sexual activity: Never  Other Topics Concern  . 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 Full Code ( reviewed 2015)       Occasional exercise.   Diet: fruits and veggies, lean meats.   Social Determinants of Health   Financial Resource Strain:   . Difficulty of Paying Living Expenses: Not on file  Food Insecurity:   . Worried About Charity fundraiser in the Last Year: Not on file  . Ran Out of Food in the Last Year: Not on file  Transportation Needs:   . Lack of Transportation (Medical): Not on file  . Lack of Transportation (Non-Medical): Not on file  Physical Activity:   . Days of Exercise per Week: Not on file  . Minutes of Exercise per Session: Not on file  Stress:   . Feeling of Stress : Not on file  Social Connections:   . Frequency of Communication with Friends and Family: Not on file  . Frequency of Social Gatherings with Friends and Family: Not on file  . Attends Religious Services: Not on file  . Active Member of Clubs or Organizations: Not on file  . Attends Archivist  Meetings: Not on file  . Marital Status: Not on file     Family History: The patient's family history includes Atrial fibrillation in her brother; Breast cancer (age of onset: 10) in her paternal aunt; Diabetes in an other family member; Heart attack in her brother; Heart disease in her brother and mother; Pancreatic cancer in her father.  ROS:   Please see the history of present illness.    All other systems reviewed and are negative.  EKGs/Labs/Other Studies Reviewed:    EKG:  EKG is not ordered today.  The ekg ordered 12/06/19  demonstrates sinus bradycardia.  Rate 53 bpm.  Non-specific ST-T changes.  Echo 05/14/19:  1. The left ventricle has normal systolic function with an ejection  fraction of 60-65%. The cavity size was normal. There is mildly increased  left ventricular wall thickness. Left ventricular diastolic Doppler  parameters are consistent with  pseudonormalization.  2. The right ventricle has normal systolic function. The cavity was  normal. There is no increase in right ventricular wall thickness. Right  ventricular systolic pressure is mildly elevated with an estimated  pressure of 41.7 mmHg.  3. Left atrial size was moderately dilated.  4. Incompletely evaluated hypoechoic structure noted in the liver, most  likely a cyst.  5. The mitral valve is degenerative. Mild thickening of the mitral valve  leaflet. Mitral valve regurgitation is moderate by color flow Doppler.  6. Tricuspid valve regurgitation is moderate.  7. The aortic valve has an indeterminate number of cusps. Mild thickening  of the aortic valve. Mild calcification of the aortic valve. Aortic valve  regurgitation is mild by color flow Doppler.  8. The aorta is normal in size and structure.   Recent Labs: 07/25/2019: ALT 11 11/08/2019: Magnesium 2.1 12/06/2019: TSH 1.440 03/18/2020: Hemoglobin 11.0; Platelets 202 04/22/2020: BUN 28; Creatinine, Ser 1.35; Potassium 4.1; Sodium 141   Recent Lipid  Panel    Component Value Date/Time   CHOL 223 (H) 07/25/2019 0905   TRIG 57.0 07/25/2019 0905   HDL 76.40 07/25/2019 0905   CHOLHDL 3 07/25/2019 0905   VLDL 11.4 07/25/2019 0905   LDLCALC 135 (H) 07/25/2019 0905   LDLDIRECT 104.4 11/26/2012 0746    Physical Exam:    VS:  BP 130/60   Pulse 60   Temp 97.8 F (36.6 C)   Ht 5\' 3"  (1.6 m)   Wt 144 lb (65.3 kg)   SpO2 98%   BMI 25.51 kg/m  , BMI Body mass index is 25.51 kg/m. GENERAL:  Well appearing HEENT: Pupils equal round and reactive, fundi not visualized, oral mucosa unremarkable NECK:  No jugular venous distention, waveform within normal limits, carotid upstroke brisk and symmetric, no bruits LUNGS:  Clear to auscultation bilaterally HEART:  RRR.  PMI not displaced or sustained,S1 and S2 within normal limits, no S3, no S4, no clicks, no rubs, III/VI systolic murmurs ABD:  Flat, positive bowel sounds normal in frequency in pitch, no bruits, no rebound, no guarding, no midline pulsatile mass, no hepatomegaly, no splenomegaly EXT:  2 plus pulses throughout, no edema, no cyanosis no clubbing SKIN:  No rashes no nodules NEURO:  Cranial nerves II through XII grossly intact, motor grossly intact throughout PSYCH:  Cognitively intact, oriented to person place and time   ASSESSMENT:    1. Essential hypertension   2. Coronary artery disease, non-occlusive   3. Aortic valve stenosis, mild   4. Persistent atrial fibrillation ([place])   5. HYPERLIPIDEMIA     PLAN:    # Essential hypertension: Blood pressure is much better controlled.  Continue hydrochlorothiazide, losartan, and hydralazine.  She will get back into her exercise program as able.  # Persistent atrial fibrillation: Maintaining sinus rhythm since ablation.  Continue Eliquis.  # Bradycardia:  She is asymptomatic.  We will not add any nodal agents.  # Hyperlipidemia:  ASCVD 10 year risk 27%.  She wants to work on diet and exercise.  Repeat lipids/CMP in 6 months.     Disposition:    FU with MD/PharmD in 6 months     Medication  Adjustments/Labs and Tests Ordered: Current medicines are reviewed at length with the patient today.  Concerns regarding medicines are outlined above.  No orders of the defined types were placed in this encounter.  No orders of the defined types were placed in this encounter.    Signed, [name], MD  05/29/2020 4:11 PM    [facility]

## 2020-05-29 NOTE — Patient Instructions (Signed)
Medication Instructions:  Your physician recommends that you continue on your current medications as directed. Please refer to the Current Medication list given to you today.  *If you need a refill on your cardiac medications before your next appointment, please call your pharmacy*   Lab Work: NONE   Testing/Procedures: NONE    Follow-Up: At [facility], you and your health needs are our priority.  As part of our continuing mission to provide you with exceptional heart care, we have created designated Provider Care Teams.  These Care Teams include your primary Cardiologist (physician) and Advanced Practice Providers (APPs -  Physician Assistants and Nurse Practitioners) who all work together to provide you with the care you need, when you need it.  We recommend signing up for the patient portal called "MyChart".  Sign up information is provided on this After Visit Summary.  MyChart is used to connect with patients for Virtual Visits (Telemedicine).  Patients are able to view lab/test results, encounter notes, upcoming appointments, etc.  Non-urgent messages can be sent to your provider as well.   To learn more about what you can do with MyChart, go to [email or web address].    Your next appointment:   6 month(s)  The format for your next appointment:   In Person  Provider:   You may see DR [facility]  or one of the following Advanced Practice Providers on your designated Care Team:    Kerin Ransom, PA-C  [place], [place]  Coletta Memos, [place]

## 2020-06-05 DIAGNOSIS — M546 Pain in thoracic spine: Secondary | ICD-10-CM | POA: Diagnosis not present

## 2020-06-05 DIAGNOSIS — M545 Low back pain: Secondary | ICD-10-CM | POA: Diagnosis not present

## 2020-06-05 DIAGNOSIS — M9902 Segmental and somatic dysfunction of thoracic region: Secondary | ICD-10-CM | POA: Diagnosis not present

## 2020-06-05 DIAGNOSIS — M9903 Segmental and somatic dysfunction of lumbar region: Secondary | ICD-10-CM | POA: Diagnosis not present

## 2020-06-08 DIAGNOSIS — M545 Low back pain: Secondary | ICD-10-CM | POA: Diagnosis not present

## 2020-06-08 DIAGNOSIS — M9903 Segmental and somatic dysfunction of lumbar region: Secondary | ICD-10-CM | POA: Diagnosis not present

## 2020-06-08 DIAGNOSIS — M9902 Segmental and somatic dysfunction of thoracic region: Secondary | ICD-10-CM | POA: Diagnosis not present

## 2020-06-08 DIAGNOSIS — M546 Pain in thoracic spine: Secondary | ICD-10-CM | POA: Diagnosis not present

## 2020-06-11 DIAGNOSIS — M9903 Segmental and somatic dysfunction of lumbar region: Secondary | ICD-10-CM | POA: Diagnosis not present

## 2020-06-11 DIAGNOSIS — M546 Pain in thoracic spine: Secondary | ICD-10-CM | POA: Diagnosis not present

## 2020-06-11 DIAGNOSIS — M9902 Segmental and somatic dysfunction of thoracic region: Secondary | ICD-10-CM | POA: Diagnosis not present

## 2020-06-11 DIAGNOSIS — M545 Low back pain: Secondary | ICD-10-CM | POA: Diagnosis not present

## 2020-06-12 DIAGNOSIS — M9903 Segmental and somatic dysfunction of lumbar region: Secondary | ICD-10-CM | POA: Diagnosis not present

## 2020-06-12 DIAGNOSIS — M9902 Segmental and somatic dysfunction of thoracic region: Secondary | ICD-10-CM | POA: Diagnosis not present

## 2020-06-12 DIAGNOSIS — M545 Low back pain: Secondary | ICD-10-CM | POA: Diagnosis not present

## 2020-06-12 DIAGNOSIS — M546 Pain in thoracic spine: Secondary | ICD-10-CM | POA: Diagnosis not present

## 2020-06-25 DIAGNOSIS — M545 Low back pain: Secondary | ICD-10-CM | POA: Diagnosis not present

## 2020-06-25 DIAGNOSIS — M546 Pain in thoracic spine: Secondary | ICD-10-CM | POA: Diagnosis not present

## 2020-06-25 DIAGNOSIS — M9903 Segmental and somatic dysfunction of lumbar region: Secondary | ICD-10-CM | POA: Diagnosis not present

## 2020-06-25 DIAGNOSIS — M9902 Segmental and somatic dysfunction of thoracic region: Secondary | ICD-10-CM | POA: Diagnosis not present

## 2020-06-26 DIAGNOSIS — M9903 Segmental and somatic dysfunction of lumbar region: Secondary | ICD-10-CM | POA: Diagnosis not present

## 2020-06-26 DIAGNOSIS — M546 Pain in thoracic spine: Secondary | ICD-10-CM | POA: Diagnosis not present

## 2020-06-26 DIAGNOSIS — M545 Low back pain: Secondary | ICD-10-CM | POA: Diagnosis not present

## 2020-06-26 DIAGNOSIS — M9902 Segmental and somatic dysfunction of thoracic region: Secondary | ICD-10-CM | POA: Diagnosis not present

## 2020-07-02 DIAGNOSIS — M545 Low back pain: Secondary | ICD-10-CM | POA: Diagnosis not present

## 2020-07-02 DIAGNOSIS — S90862A Insect bite (nonvenomous), left foot, initial encounter: Secondary | ICD-10-CM | POA: Diagnosis not present

## 2020-07-02 DIAGNOSIS — M9902 Segmental and somatic dysfunction of thoracic region: Secondary | ICD-10-CM | POA: Diagnosis not present

## 2020-07-02 DIAGNOSIS — M546 Pain in thoracic spine: Secondary | ICD-10-CM | POA: Diagnosis not present

## 2020-07-02 DIAGNOSIS — M9903 Segmental and somatic dysfunction of lumbar region: Secondary | ICD-10-CM | POA: Diagnosis not present

## 2020-07-03 DIAGNOSIS — M9902 Segmental and somatic dysfunction of thoracic region: Secondary | ICD-10-CM | POA: Diagnosis not present

## 2020-07-03 DIAGNOSIS — M9903 Segmental and somatic dysfunction of lumbar region: Secondary | ICD-10-CM | POA: Diagnosis not present

## 2020-07-03 DIAGNOSIS — M545 Low back pain: Secondary | ICD-10-CM | POA: Diagnosis not present

## 2020-07-03 DIAGNOSIS — M546 Pain in thoracic spine: Secondary | ICD-10-CM | POA: Diagnosis not present

## 2020-07-06 DIAGNOSIS — M546 Pain in thoracic spine: Secondary | ICD-10-CM | POA: Diagnosis not present

## 2020-07-06 DIAGNOSIS — M545 Low back pain: Secondary | ICD-10-CM | POA: Diagnosis not present

## 2020-07-06 DIAGNOSIS — M9903 Segmental and somatic dysfunction of lumbar region: Secondary | ICD-10-CM | POA: Diagnosis not present

## 2020-07-06 DIAGNOSIS — M9902 Segmental and somatic dysfunction of thoracic region: Secondary | ICD-10-CM | POA: Diagnosis not present

## 2020-07-08 DIAGNOSIS — M9902 Segmental and somatic dysfunction of thoracic region: Secondary | ICD-10-CM | POA: Diagnosis not present

## 2020-07-08 DIAGNOSIS — M546 Pain in thoracic spine: Secondary | ICD-10-CM | POA: Diagnosis not present

## 2020-07-08 DIAGNOSIS — M9903 Segmental and somatic dysfunction of lumbar region: Secondary | ICD-10-CM | POA: Diagnosis not present

## 2020-07-08 DIAGNOSIS — M545 Low back pain: Secondary | ICD-10-CM | POA: Diagnosis not present

## 2020-07-20 ENCOUNTER — Other Ambulatory Visit: Payer: Self-pay | Admitting: Cardiovascular Disease

## 2020-07-20 NOTE — Telephone Encounter (Signed)
Rx has been sent to the pharmacy electronically. ° °

## 2020-08-05 DIAGNOSIS — H5711 Ocular pain, right eye: Secondary | ICD-10-CM | POA: Diagnosis not present

## 2020-08-18 ENCOUNTER — Telehealth: Payer: Self-pay | Admitting: *Deleted

## 2020-08-18 NOTE — Telephone Encounter (Signed)
Patient contacted to confirm that she is able to continue her exercise program at the [facility] after missing a month of attendance. Will redirect to the wellness coordinator and have her to reach patient to reset the membership specifics.   Patient agreeable with plan.    Landis Martins, MS, ACSM, [facility] Clinical Exercise Physiologist/ Health and Wellness Coach

## 2020-08-21 ENCOUNTER — Other Ambulatory Visit: Payer: Self-pay | Admitting: Internal Medicine

## 2020-08-21 NOTE — Telephone Encounter (Signed)
Refill Request.  

## 2020-08-21 NOTE — Telephone Encounter (Signed)
Prescription refill request for Eliquis received. Indication:  Atrial Fibrillation Last office visit: 05/2020 Oval Linsey Scr: 1.35 04/2020 Age: 79 Weight:65.3 kg  Prescription refilled

## 2020-08-26 ENCOUNTER — Other Ambulatory Visit: Payer: Self-pay | Admitting: *Deleted

## 2020-08-26 NOTE — Telephone Encounter (Signed)
[name]- I forwarded this info to the [facility] staff. Our referral system is now live and you can refer patients to the [facility] via epic. [name] will have access to this for pulling referrals.

## 2020-10-15 ENCOUNTER — Telehealth (INDEPENDENT_AMBULATORY_CARE_PROVIDER_SITE_OTHER): Payer: Medicare Other | Admitting: Family Medicine

## 2020-10-15 ENCOUNTER — Encounter: Payer: Self-pay | Admitting: Family Medicine

## 2020-10-15 VITALS — Temp 97.4°F | Ht 63.0 in

## 2020-10-15 DIAGNOSIS — R197 Diarrhea, unspecified: Secondary | ICD-10-CM

## 2020-10-15 DIAGNOSIS — R350 Frequency of micturition: Secondary | ICD-10-CM | POA: Diagnosis not present

## 2020-10-15 NOTE — Progress Notes (Signed)
VIRTUAL VISIT Due to national recommendations of social distancing due to [place] 19, a virtual visit is felt to be most appropriate for this patient at this time.   I connected with the patient on 10/15/20 at 11:20 AM EST by virtual telehealth platform and verified that I am speaking with the correct person using two identifiers.   Interactive audio and video telecommunications were attempted between this provider and patient, however failed, due to patient having technical difficulties OR patient did not have access to video capability.  We continued and completed visit with audio only.   Virtual Visit via Telephone Note  I discussed the limitations, risks, security and privacy concerns of performing an evaluation and management service by  virtual telehealth platform and the availability of in person appointments. I also discussed with the patient that there may be a patient responsible charge related to this service. The patient expressed understanding and agreed to proceed.  Patient location: Home Provider Location: [facility] Participants: Eliezer Lofts and Rosalyn Gess   Chief Complaint  Patient presents with  . Urinary Frequency  . Frequent Loose Stools    History of Present Illness:  80 year old female with history of  Afib and HTN presents with new onset in the last month of  Increase urinary frequency, urgency, bladder pressure, nocturia.  No dysuria, fever, no N/V.  She also has noted diarrhea, loose stool in last month. Describes as softer stool. No blood in stool. Feel pretty good otherwise, no fatigue.  No history of urinary issue.  No change in diet, no new medications.   Normal, intake and drinking an lot of water... increase in thirst.   Last GRF 44 in 04/2020  COVID 19 screen.. S/P COVID vaccine x 3, last booster in 09/2020 No recent travel or known exposure to [place]  The importance of social distancing was discussed today.   Review of Systems   Constitutional: Negative for chills and fever.  HENT: Negative for congestion and ear pain.   Eyes: Negative for pain and redness.  Respiratory: Negative for cough and shortness of breath.   Cardiovascular: Negative for chest pain, palpitations and leg swelling.  Gastrointestinal: Negative for abdominal pain, blood in stool, constipation, diarrhea, nausea and vomiting.  Musculoskeletal: Negative for falls and myalgias.  Skin: Negative for rash.  Neurological: Negative for dizziness.  Psychiatric/Behavioral: Negative for depression. The patient is not nervous/anxious.       Past Medical History:  Diagnosis Date  . Anemia   . Coronary artery disease, non-occlusive    a. cath 2/09: no CAD, EF 70%  . HYPERLIPIDEMIA   . HYPERTENSION   . Mild Aortic insufficiency   . Moderate mitral regurgitation   . Moderate tricuspid regurgitation   . OSTEOPENIA   . PAF (paroxysmal atrial fibrillation) ([place])    a. s/p ablation 2013; b. CHADS2VASc -> 4 (HTN, age x 2, female)-->Eliquis.  Marland Kitchen PAT (paroxysmal atrial tachycardia) ([place])    a. 04/2019 Zio: Occas PACs and rare PVCs. 21 atrial runs - longest 20 beats, max rate 169.  Marland Kitchen Pneumonia 2009  . RA (rheumatoid arthritis) ([place])   . Sinus Bradycardia    a. asymptomatic but prevents use of AVN blocking agents; b. 04/2019 Zio: Avg HR 61 (37-109).  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 Valvular heart disease    a. 05/2019 Echo: EF 60-65%. DD. RVSP 41.20mmHg. Mod dil LA. Mod MR/TR. Mild AI.    reports that she has never smoked. She has  never used smokeless tobacco. She reports that she does not drink alcohol and does not use drugs.   Current Outpatient Medications:  .  B Complex Vitamins (VITAMIN B COMPLEX) TABS, Take 1 tablet by mouth daily. , Disp: , Rfl:  .  cholecalciferol (VITAMIN D3) 25 MCG (1000 UNIT) tablet, Take 1,000 Units by mouth daily., Disp: , Rfl:  .  ELIQUIS 5 MG TABS tablet, TAKE 1 TABLET BY MOUTH TWICE DAILY, Disp: 180 tablet, Rfl: 1 .   hydrALAZINE (APRESOLINE) 50 MG tablet, TAKE 1 TABLET BY MOUTH TWICE DAILY, Disp: 180 tablet, Rfl: 2 .  hydrochlorothiazide (HYDRODIURIL) 25 MG tablet, Take 0.5 tablets (12.5 mg total) by mouth daily., Disp: 15 tablet, Rfl: 4 .  losartan (COZAAR) 100 MG tablet, Take 1 tablet (100 mg total) by mouth daily., Disp: 90 tablet, Rfl: 1 .  LUMIGAN 0.01 % SOLN, , Disp: , Rfl:    Observations/Objective: Temperature (!) 97.4 F (36.3 C), temperature source Temporal, height 5\' 3"  (1.6 m). BP at home 139/78 Physical Exam Constitutional:      General: The patient is not in acute distress. Pulmonary:     Effort: Pulmonary effort is normal. No respiratory distress.  Neurological:     Mental Status: The patient is alert and oriented to person, place, and time.  Psychiatric:        Mood and Affect: Mood normal.        Behavior: Behavior normal.    Assessment and Plan     I discussed the assessment and treatment plan with the patient. The patient was provided an opportunity to ask questions and all were answered. The patient agreed with the plan and demonstrated an understanding of the instructions.   The patient was advised to call back or seek an in-person evaluation if the symptoms worsen or if the condition fails to improve as anticipated.   Total visit time 20 minutes, > 50% spent counseling and cordinating patients care.   Eliezer Lofts, MD

## 2020-10-16 ENCOUNTER — Other Ambulatory Visit: Payer: Self-pay

## 2020-10-16 ENCOUNTER — Other Ambulatory Visit (INDEPENDENT_AMBULATORY_CARE_PROVIDER_SITE_OTHER): Payer: Medicare Other

## 2020-10-16 DIAGNOSIS — R197 Diarrhea, unspecified: Secondary | ICD-10-CM

## 2020-10-16 DIAGNOSIS — R350 Frequency of micturition: Secondary | ICD-10-CM | POA: Diagnosis not present

## 2020-10-16 LAB — POCT URINALYSIS DIP (CLINITEK)
Bilirubin, UA: NEGATIVE
Blood, UA: NEGATIVE
Glucose, UA: NEGATIVE mg/dL
Nitrite, UA: NEGATIVE
Spec Grav, UA: 1.02 (ref 1.010–1.025)
Urobilinogen, UA: 0.2 E.U./dL
pH, UA: 6 (ref 5.0–8.0)

## 2020-10-16 LAB — COMPREHENSIVE METABOLIC PANEL
ALT: 12 U/L (ref 0–35)
AST: 19 U/L (ref 0–37)
Albumin: 4.4 g/dL (ref 3.5–5.2)
Alkaline Phosphatase: 50 U/L (ref 39–117)
BUN: 28 mg/dL — ABNORMAL HIGH (ref 6–23)
CO2: 27 mEq/L (ref 19–32)
Calcium: 9.7 mg/dL (ref 8.4–10.5)
Chloride: 106 mEq/L (ref 96–112)
Creatinine, Ser: 1.57 mg/dL — ABNORMAL HIGH (ref 0.40–1.20)
GFR: 31.45 mL/min — ABNORMAL LOW (ref >60.00)
Glucose, Bld: 108 mg/dL — ABNORMAL HIGH (ref 70–99)
Potassium: 3.7 mEq/L (ref 3.5–5.1)
Sodium: 139 mEq/L (ref 135–145)
Total Bilirubin: 0.6 mg/dL (ref 0.2–1.2)
Total Protein: 7.5 g/dL (ref 6.0–8.3)

## 2020-10-16 LAB — POCT UA - MICROSCOPIC ONLY

## 2020-10-16 LAB — CBC WITH DIFFERENTIAL/PLATELET
Basophils Absolute: 0 10*3/uL (ref 0.0–0.1)
Basophils Relative: 0.6 % (ref 0.0–3.0)
Eosinophils Absolute: 0.1 10*3/uL (ref 0.0–0.7)
Eosinophils Relative: 1.9 % (ref 0.0–5.0)
HCT: 31.3 % — ABNORMAL LOW (ref 36.0–46.0)
Hemoglobin: 10.6 g/dL — ABNORMAL LOW (ref 12.0–15.0)
Lymphocytes Relative: 42.6 % (ref 12.0–46.0)
Lymphs Abs: 2 10*3/uL (ref 0.7–4.0)
MCHC: 34 g/dL (ref 30.0–36.0)
MCV: 91.2 fl (ref 78.0–100.0)
Monocytes Absolute: 0.3 10*3/uL (ref 0.1–1.0)
Monocytes Relative: 6.7 % (ref 3.0–12.0)
Neutro Abs: 2.2 10*3/uL (ref 1.4–7.7)
Neutrophils Relative %: 48.2 % (ref 43.0–77.0)
Platelets: 201 10*3/uL (ref 150.0–400.0)
RBC: 3.43 Mil/uL — ABNORMAL LOW (ref 3.87–5.11)
RDW: 13.4 % (ref 11.5–15.5)
WBC: 4.6 10*3/uL (ref 4.0–10.5)

## 2020-10-16 MED ORDER — CEPHALEXIN 500 MG PO CAPS: 500.0000 mg | ORAL_CAPSULE | Freq: Three times a day (TID) | ORAL | 0 refills | Status: DC

## 2020-10-16 NOTE — Addendum Note (Signed)
Addended by: Carter Kitten on: 10/16/2020 03:26 PM   Modules accepted: Orders

## 2020-10-16 NOTE — Progress Notes (Signed)
See Result note 

## 2020-10-16 NOTE — Addendum Note (Signed)
Addended by: Carter Kitten on: 10/16/2020 02:43 PM   Modules accepted: Orders

## 2020-10-18 LAB — URINE CULTURE
MICRO NUMBER:: 11394473
SPECIMEN QUALITY:: ADEQUATE

## 2020-10-19 ENCOUNTER — Other Ambulatory Visit: Payer: Medicare Other

## 2020-10-19 DIAGNOSIS — R197 Diarrhea, unspecified: Secondary | ICD-10-CM

## 2020-10-20 LAB — GASTROINTESTINAL PATHOGEN PANEL PCR
C. difficile Tox A/B, PCR: NOT DETECTED
Campylobacter, PCR: NOT DETECTED
Cryptosporidium, PCR: NOT DETECTED
E coli (ETEC) LT/ST PCR: NOT DETECTED
E coli (STEC) stx1/stx2, PCR: NOT DETECTED
E coli 0157, PCR: NOT DETECTED
Giardia lamblia, PCR: NOT DETECTED
Norovirus, PCR: NOT DETECTED
Rotavirus A, PCR: NOT DETECTED
Salmonella, PCR: NOT DETECTED
Shigella, PCR: NOT DETECTED

## 2020-10-21 ENCOUNTER — Telehealth: Payer: Self-pay

## 2020-10-21 NOTE — Telephone Encounter (Signed)
[name] notified by telephone that her GI panel was negative. I told her that Dr. Diona Browner felt like her symptoms were coming from the UTI.  I ask how she was feeling.  Patient states she is feel much better.   FYI to Dr. Diona Browner.  (patient had not reviewed her results on MyChart).

## 2020-10-21 NOTE — Telephone Encounter (Signed)
Pt requesting cb with results from stool specimen brought back in last wk. [facility].sending note to Butch Penny CMA.

## 2020-11-05 ENCOUNTER — Telehealth: Payer: Self-pay | Admitting: Family Medicine

## 2020-11-05 NOTE — Telephone Encounter (Signed)
Pt called in wanted to know if she can get a referral to a neurologist due to she is having memory problems

## 2020-11-06 NOTE — Telephone Encounter (Signed)
Ms. Eastburn notified as instructed by telephone.  Appointment scheduled on 11/10/2020 at 2:40 pm with Dr. Diona Browner.

## 2020-11-06 NOTE — Telephone Encounter (Signed)
Have her make appt here first so we can look into reversible causes. If needed then I ca refer to neurology.

## 2020-11-10 ENCOUNTER — Encounter: Payer: Self-pay | Admitting: Family Medicine

## 2020-11-10 ENCOUNTER — Other Ambulatory Visit: Payer: Self-pay

## 2020-11-10 ENCOUNTER — Ambulatory Visit (INDEPENDENT_AMBULATORY_CARE_PROVIDER_SITE_OTHER): Payer: Medicare Other | Admitting: Family Medicine

## 2020-11-10 VITALS — BP 140/62 | HR 66 | Temp 98.0°F | Ht 63.0 in | Wt 148.5 lb

## 2020-11-10 DIAGNOSIS — R413 Other amnesia: Secondary | ICD-10-CM

## 2020-11-10 DIAGNOSIS — R350 Frequency of micturition: Secondary | ICD-10-CM | POA: Diagnosis not present

## 2020-11-10 DIAGNOSIS — F03B0 Unspecified dementia, moderate, without behavioral disturbance, psychotic disturbance, mood disturbance, and anxiety: Secondary | ICD-10-CM | POA: Insufficient documentation

## 2020-11-10 DIAGNOSIS — Z23 Encounter for immunization: Secondary | ICD-10-CM

## 2020-11-10 LAB — POC URINALSYSI DIPSTICK (AUTOMATED)
Bilirubin, UA: NEGATIVE
Blood, UA: NEGATIVE
Glucose, UA: NEGATIVE
Ketones, UA: NEGATIVE
Nitrite, UA: NEGATIVE
Protein, UA: NEGATIVE
Spec Grav, UA: 1.02 (ref 1.010–1.025)
Urobilinogen, UA: 0.2 E.U./dL
pH, UA: 6 (ref 5.0–8.0)

## 2020-11-10 NOTE — Progress Notes (Signed)
Patient ID: [name], female    DOB: Nov 24, 1940, 80 y.o.   MRN: [id number]  This visit was conducted in person.  BP 140/62   Pulse 66   Temp 98 F (36.7 C) (Temporal)   Ht 5\' 3"  (1.6 m)   Wt 148 lb 8 oz (67.4 kg)   SpO2 98%   BMI 26.31 kg/m    CC:  Chief Complaint  Patient presents with  . Memory Loss    Wants referral  to neurology    Subjective:   HPI: [name] is a 80 y.o. female presenting on 11/10/2020 for Memory Loss (Wants referral  to neurology)    She reports her family has noted decreased  Memory and forgetfulness in the last several months.  Forgetting appointments, trouble focusing. She reports it has been gradual change.   No new meds.  Recent UTI.Marland Kitchen  No dysuria now but still urinary frequency.  Citrobacter koseri  Culture 10/16/2020 treated with keflex x 7 days  Diarrhea resolved.      Relevant past medical, surgical, family and social history reviewed and updated as indicated. Interim medical history since our last visit reviewed. Allergies and medications reviewed and updated. Outpatient Medications Prior to Visit  Medication Sig Dispense Refill  . B Complex Vitamins (VITAMIN B COMPLEX) TABS Take 1 tablet by mouth daily.     . cholecalciferol (VITAMIN D3) 25 MCG (1000 UNIT) tablet Take 1,000 Units by mouth daily.    Marland Kitchen ELIQUIS 5 MG TABS tablet TAKE 1 TABLET BY MOUTH TWICE DAILY 180 tablet 1  . hydrALAZINE (APRESOLINE) 50 MG tablet TAKE 1 TABLET BY MOUTH TWICE DAILY 180 tablet 2  . hydrochlorothiazide (HYDRODIURIL) 25 MG tablet Take 0.5 tablets (12.5 mg total) by mouth daily. 15 tablet 4  . losartan (COZAAR) 100 MG tablet Take 1 tablet (100 mg total) by mouth daily. 90 tablet 1  . LUMIGAN 0.01 % SOLN     . cephALEXin (KEFLEX) 500 MG capsule Take 1 capsule (500 mg total) by mouth 3 (three) times daily. 21 capsule 0   No facility-administered medications prior to visit.     Per HPI unless specifically indicated in ROS section  below Review of Systems  Constitutional: Negative for fatigue and fever.  HENT: Negative for congestion.   Eyes: Negative for pain.  Respiratory: Negative for cough and shortness of breath.   Cardiovascular: Negative for chest pain, palpitations and leg swelling.  Gastrointestinal: Negative for abdominal pain.  Genitourinary: Negative for dysuria and vaginal bleeding.  Musculoskeletal: Negative for back pain.  Neurological: Negative for syncope, light-headedness and headaches.  Psychiatric/Behavioral: Negative for dysphoric mood.   Objective:  BP 140/62   Pulse 66   Temp 98 F (36.7 C) (Temporal)   Ht 5\' 3"  (1.6 m)   Wt 148 lb 8 oz (67.4 kg)   SpO2 98%   BMI 26.31 kg/m   Wt Readings from Last 3 Encounters:  11/10/20 148 lb 8 oz (67.4 kg)  05/29/20 144 lb (65.3 kg)  05/21/20 142 lb (64.4 kg)      Physical Exam Constitutional:      General: She is not in acute distress.    Appearance: Normal appearance. She is well-developed. She is not ill-appearing or toxic-appearing.  HENT:     Head: Normocephalic.     Right Ear: Hearing, tympanic membrane, ear canal and external ear normal. Tympanic membrane is not erythematous, retracted or bulging.     Left Ear: Hearing, tympanic  membrane, ear canal and external ear normal. Tympanic membrane is not erythematous, retracted or bulging.     Nose: No mucosal edema or rhinorrhea.     Right Sinus: No maxillary sinus tenderness or frontal sinus tenderness.     Left Sinus: No maxillary sinus tenderness or frontal sinus tenderness.     Mouth/Throat:     Pharynx: Uvula midline.  Eyes:     General: Lids are normal. Lids are everted, no foreign bodies appreciated.     Conjunctiva/sclera: Conjunctivae normal.     Pupils: Pupils are equal, round, and reactive to light.  Neck:     Thyroid: No thyroid mass or thyromegaly.     Vascular: No carotid bruit.     Trachea: Trachea normal.  Cardiovascular:     Rate and Rhythm: Normal rate and regular  rhythm.     Pulses: Normal pulses.     Heart sounds: Normal heart sounds, S1 normal and S2 normal. No murmur heard. No friction rub. No gallop.   Pulmonary:     Effort: Pulmonary effort is normal. No tachypnea or respiratory distress.     Breath sounds: Normal breath sounds. No decreased breath sounds, wheezing, rhonchi or rales.  Abdominal:     General: Bowel sounds are normal.     Palpations: Abdomen is soft.     Tenderness: There is no abdominal tenderness.  Musculoskeletal:     Cervical back: Normal range of motion and neck supple.  Skin:    General: Skin is warm and dry.     Findings: No rash.  Neurological:     Mental Status: She is alert.  Psychiatric:        Mood and Affect: Mood is not anxious or depressed.        Speech: Speech normal.        Behavior: Behavior normal. Behavior is cooperative.        Thought Content: Thought content normal.        Judgment: Judgment normal.      MMSE: short term recall diminished.  28/30 Animal recall:  6 in 30 sec Clock drawing: normal    Results for orders placed or performed in visit on 10/19/20  Gastrointestinal Pathogen Panel PCR  Result Value Ref Range   Campylobacter, PCR NOT DETECTED NOT DETECT   C. difficile Tox A/B, PCR NOT DETECTED NOT DETECT   E coli 0157, PCR NOT DETECTED NOT DETECT   E coli (ETEC) LT/ST PCR NOT DETECTED NOT DETECT   E coli (STEC) stx1/stx2, PCR NOT DETECTED NOT DETECT   Salmonella, PCR NOT DETECTED NOT DETECT   Shigella, PCR NOT DETECTED NOT DETECT   Norovirus, PCR NOT DETECTED NOT DETECT   Rotavirus A, PCR NOT DETECTED NOT DETECT   Giardia lamblia, PCR NOT DETECTED NOT DETECT   Cryptosporidium, PCR NOT DETECTED NOT DETECT    This visit occurred during the SARS-CoV-2 public health emergency.  Safety protocols were in place, including screening questions prior to the visit, additional usage of staff PPE, and extensive cleaning of exam room while observing appropriate contact time as indicated for  disinfecting solutions.   COVID 19 screen:  No recent travel or known exposure to COVID19 The patient denies respiratory symptoms of COVID 19 at this time. The importance of social distancing was discussed today.   Assessment and Plan Problem List Items Addressed This Visit    Memory loss - Primary    MMSE: short term recall diminished.  28/30 Animal recall:  6 in 30 sec Clock drawing: normal  Eval for secondary cause with labs. Refer to neuro for further eval,      Relevant Orders   Ambulatory referral to Neurology   CBC with Differential/Platelet (Completed)   Comprehensive metabolic panel (Completed)   T3, free (Completed)   T4, free (Completed)   TSH (Completed)   VITAMIN D 25 Hydroxy (Vit-D Deficiency, Fractures) (Completed)   Vitamin B12 (Completed)   Urinary frequency    UA clear in office today.       Relevant Orders   POCT Urinalysis Dipstick (Automated) (Completed)   Urine Culture (Completed)    Other Visit Diagnoses    Need for influenza vaccination       Relevant Orders   Flu Vaccine QUAD High Dose(Fluad) (Completed)         Eliezer Lofts, MD

## 2020-11-10 NOTE — Patient Instructions (Addendum)
Please stop at the lab to have labs drawn. We will  start the referral process for neurologist as you requested.

## 2020-11-11 ENCOUNTER — Other Ambulatory Visit (INDEPENDENT_AMBULATORY_CARE_PROVIDER_SITE_OTHER): Payer: Medicare Other

## 2020-11-11 ENCOUNTER — Telehealth: Payer: Self-pay | Admitting: Family Medicine

## 2020-11-11 DIAGNOSIS — D638 Anemia in other chronic diseases classified elsewhere: Secondary | ICD-10-CM

## 2020-11-11 DIAGNOSIS — D649 Anemia, unspecified: Secondary | ICD-10-CM | POA: Diagnosis not present

## 2020-11-11 DIAGNOSIS — R413 Other amnesia: Secondary | ICD-10-CM | POA: Diagnosis not present

## 2020-11-11 LAB — COMPREHENSIVE METABOLIC PANEL
ALT: 13 U/L (ref 0–35)
AST: 20 U/L (ref 0–37)
Albumin: 4.1 g/dL (ref 3.5–5.2)
Alkaline Phosphatase: 48 U/L (ref 39–117)
BUN: 28 mg/dL — ABNORMAL HIGH (ref 6–23)
CO2: 28 mEq/L (ref 19–32)
Calcium: 9.8 mg/dL (ref 8.4–10.5)
Chloride: 107 mEq/L (ref 96–112)
Creatinine, Ser: 1.24 mg/dL — ABNORMAL HIGH (ref 0.40–1.20)
GFR: 41.72 mL/min — ABNORMAL LOW (ref >60.00)
Glucose, Bld: 85 mg/dL (ref 70–99)
Potassium: 4.1 mEq/L (ref 3.5–5.1)
Sodium: 140 mEq/L (ref 135–145)
Total Bilirubin: 1 mg/dL (ref 0.2–1.2)
Total Protein: 7 g/dL (ref 6.0–8.3)

## 2020-11-11 LAB — CBC WITH DIFFERENTIAL/PLATELET
Basophils Absolute: 0.1 10*3/uL (ref 0.0–0.1)
Basophils Relative: 1.2 % (ref 0.0–3.0)
Eosinophils Absolute: 0.1 10*3/uL (ref 0.0–0.7)
Eosinophils Relative: 1.3 % (ref 0.0–5.0)
HCT: 32.1 % — ABNORMAL LOW (ref 36.0–46.0)
Hemoglobin: 10.6 g/dL — ABNORMAL LOW (ref 12.0–15.0)
Lymphocytes Relative: 19.5 % (ref 12.0–46.0)
Lymphs Abs: 1.1 10*3/uL (ref 0.7–4.0)
MCHC: 33.1 g/dL (ref 30.0–36.0)
MCV: 92.2 fl (ref 78.0–100.0)
Monocytes Absolute: 0.4 10*3/uL (ref 0.1–1.0)
Monocytes Relative: 7.9 % (ref 3.0–12.0)
Neutro Abs: 3.8 10*3/uL (ref 1.4–7.7)
Neutrophils Relative %: 70.1 % (ref 43.0–77.0)
Platelets: 183 10*3/uL (ref 150.0–400.0)
RBC: 3.48 Mil/uL — ABNORMAL LOW (ref 3.87–5.11)
RDW: 13.6 % (ref 11.5–15.5)
WBC: 5.4 10*3/uL (ref 4.0–10.5)

## 2020-11-11 LAB — TSH: TSH: 1.95 u[IU]/mL (ref 0.35–4.50)

## 2020-11-11 LAB — URINE CULTURE
MICRO NUMBER:: 11481451
SPECIMEN QUALITY:: ADEQUATE

## 2020-11-11 LAB — IBC PANEL
Iron: 66 ug/dL (ref 42–145)
Saturation Ratios: 21.9 % (ref 20.0–50.0)
Transferrin: 215 mg/dL (ref 212.0–360.0)

## 2020-11-11 LAB — T4, FREE: Free T4: 0.92 ng/dL (ref 0.60–1.60)

## 2020-11-11 LAB — VITAMIN B12: Vitamin B-12: 362 pg/mL (ref 211–911)

## 2020-11-11 LAB — VITAMIN D 25 HYDROXY (VIT D DEFICIENCY, FRACTURES): VITD: 42.07 ng/mL (ref 30.00–100.00)

## 2020-11-11 LAB — T3, FREE: T3, Free: 2.6 pg/mL (ref 2.3–4.2)

## 2020-11-11 NOTE — Telephone Encounter (Signed)
Patient is calling in about her "new Meds" that Dr Diona Browner was calling in for her. She states that [facility] that is to get the order and they don't have the request for it. Please advise . EM

## 2020-11-12 NOTE — Telephone Encounter (Signed)
Call   I am not sure what "new meds" she is talking about. I have sent her 2 MyChart messages about her labs. If she has not seen them please relay the information and questions.

## 2020-11-12 NOTE — Telephone Encounter (Signed)
Spoke with [name] and advised that Dr. Diona Browner did not prescribe any new medication for her at her last appointment.  Patient states she got confused with her AVS because she saw [facility] listed on her summer[facility] and thought a prescription has been sent in.    Lab results discussed with patient.  She is not taking any iron supplement.  She is only taking Vit D and B Complex.   She has not seen a hematologist in the past for her anemia.

## 2020-11-16 ENCOUNTER — Encounter: Payer: Self-pay | Admitting: Family Medicine

## 2020-11-16 NOTE — Telephone Encounter (Signed)
[name] notified as instructed by telephone.  She is agreeable to hematology referral.  Patient doesn't have a preference to location.

## 2020-11-16 NOTE — Telephone Encounter (Signed)
Let pt know she has anemia of chronic disease. .. if she is feeling fatigued with this I can refer her to hematology for consideration of furtehr eval and treatment options.

## 2020-11-17 NOTE — Addendum Note (Signed)
Addended byEliezer Lofts E on: 11/17/2020 01:30 PM   Modules accepted: Orders

## 2020-11-24 ENCOUNTER — Encounter: Payer: Self-pay | Admitting: Oncology

## 2020-11-24 ENCOUNTER — Inpatient Hospital Stay: Payer: Medicare Other | Attending: Oncology | Admitting: Oncology

## 2020-11-24 ENCOUNTER — Inpatient Hospital Stay: Payer: Medicare Other

## 2020-11-24 VITALS — BP 157/66 | HR 62 | Temp 96.6°F | Resp 18 | Wt 151.8 lb

## 2020-11-24 DIAGNOSIS — I129 Hypertensive chronic kidney disease with stage 1 through stage 4 chronic kidney disease, or unspecified chronic kidney disease: Secondary | ICD-10-CM | POA: Diagnosis not present

## 2020-11-24 DIAGNOSIS — D649 Anemia, unspecified: Secondary | ICD-10-CM | POA: Diagnosis not present

## 2020-11-24 DIAGNOSIS — Z803 Family history of malignant neoplasm of breast: Secondary | ICD-10-CM | POA: Insufficient documentation

## 2020-11-24 DIAGNOSIS — N183 Chronic kidney disease, stage 3 unspecified: Secondary | ICD-10-CM | POA: Insufficient documentation

## 2020-11-24 DIAGNOSIS — Z800 Family history of malignant neoplasm of digestive organs: Secondary | ICD-10-CM | POA: Diagnosis not present

## 2020-11-24 DIAGNOSIS — I480 Paroxysmal atrial fibrillation: Secondary | ICD-10-CM | POA: Insufficient documentation

## 2020-11-24 DIAGNOSIS — I2510 Atherosclerotic heart disease of native coronary artery without angina pectoris: Secondary | ICD-10-CM | POA: Diagnosis not present

## 2020-11-24 NOTE — Progress Notes (Signed)
Hematology/Oncology Consult note [facility] Telephone:([phone] [phone]  Patient Care Team: [name], MD as PCP - General [name], MD as PCP - Cardiology (Cardiology) Birder Robson, MD as Referring Physician (Ophthalmology)   Name of the patient: [name]  [id number]  1941/07/27    Reason for referral-anemia   Referring physician-Dr. Eliezer Lofts  Date of visit: 11/24/20   History of presenting illness-patient is a 80 year old female with a past medical history significant for atrial fibrillation, paroxysmal supraventricular tachycardia, hypertension, coronary artery disease referred for anemia.  Her most recent CBC with differential from 11/11/2020 showed a white count of 5.4, H&H of 10.6/32.1 with an MCV of 92 and a platelet count of 183.  Looking back at her prior CBCs patient has always had a normal white count and a platelet count and her hemoglobin has typically remained between 10-11 since 2009.  Patient recently had iron studies, TSH and B12 checked on 11/11/2020 which was within normal limits.  Patient currently reports fatigue.  Denies any blood loss in her stool or urine.  ECOG PS- 1  Pain scale- 0   Review of systems- Review of Systems  Constitutional: Positive for malaise/fatigue. Negative for chills, fever and weight loss.  HENT: Negative for congestion, ear discharge and nosebleeds.   Eyes: Negative for blurred vision.  Respiratory: Negative for cough, hemoptysis, sputum production, shortness of breath and wheezing.   Cardiovascular: Negative for chest pain, palpitations, orthopnea and claudication.  Gastrointestinal: Negative for abdominal pain, blood in stool, constipation, diarrhea, heartburn, melena, nausea and vomiting.  Genitourinary: Negative for dysuria, flank pain, frequency, hematuria and urgency.  Musculoskeletal: Negative for back pain, joint pain and myalgias.  Skin: Negative for rash.   Neurological: Negative for dizziness, tingling, focal weakness, seizures, weakness and headaches.  Endo/Heme/Allergies: Does not bruise/bleed easily.  Psychiatric/Behavioral: Negative for depression and suicidal ideas. The patient does not have insomnia.     Allergies  Allergen Reactions  . Amlodipine     Ankle swelling  . Celecoxib Other (See Comments)    Tachycardia/palpitations  . Tramadol Nausea Only    Patient Active Problem List   Diagnosis Date Noted  . Memory loss 11/10/2020  . Urinary frequency 11/10/2020  . Fecal smearing 11/29/2019  . Sinus bradycardia 07/18/2019  . Valvular heart disease 07/18/2019  . PSVT (paroxysmal supraventricular tachycardia) ([place]) 05/16/2019  . Blistering rash 02/15/2019  . Persistent atrial fibrillation ([place]) 07/19/2018  . Peripheral edema 02/02/2018  . Acute nonintractable headache 02/02/2018  . Coronary artery disease, non-occlusive 09/14/2017  . Status post ablation of atrial fibrillation 09/14/2017  . Essential hypertension 09/14/2017  . Tinea pedis 07/14/2017  . 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/15/2016  . CKD (chronic kidney disease) stage 3, GFR 30-59 ml/min (HCC) 03/14/2016  . History of rheumatoid arthritis 03/14/2016  . Mitral regurgitation 12/30/2015  . Obesity 12/02/2015  . Aortic valve insufficiency 12/02/2015  . Vitamin D deficiency 04/17/2015  . Counseling regarding end of life decision making 04/17/2015  . Allergic rhinitis 02/03/2015  . Aortic valve stenosis, mild 08/19/2010  . Osteopenia 05/11/2010  . SINUS BRADYCARDIA 08/19/2009  . Anemia of chronic disease 12/13/2007  . HYPERLIPIDEMIA 08/23/2007  . UNSPECIFIED GLAUCOMA 10/10/2006     Past Medical History:  Diagnosis Date  . Anemia   . Coronary artery disease, non-occlusive    a. cath 2/09: no CAD, EF 70%  . HYPERLIPIDEMIA   . HYPERTENSION   . Mild Aortic insufficiency   . Moderate  mitral regurgitation   . Moderate tricuspid regurgitation   .  OSTEOPENIA   . PAF (paroxysmal atrial fibrillation) ([place])    a. s/p ablation 2013; b. CHADS2VASc -> 4 (HTN, age x 2, female)-->Eliquis.  Marland Kitchen PAT (paroxysmal atrial tachycardia) ([place])    a. 04/2019 Zio: Occas PACs and rare PVCs. 21 atrial runs - longest 20 beats, max rate 169.  Marland Kitchen Pneumonia 2009  . RA (rheumatoid arthritis) ([place])   . Sinus Bradycardia    a. asymptomatic but prevents use of AVN blocking agents; b. 04/2019 Zio: Avg HR 61 (37-109).  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 Valvular heart disease    a. 05/2019 Echo: EF 60-65%. DD. RVSP 41.96mmHg. Mod dil LA. Mod MR/TR. Mild AI.     Past Surgical History:  Procedure Laterality Date  . ABLATION OF DYSRHYTHMIC FOCUS    . CARDIAC CATHETERIZATION    . COLONOSCOPY  04/18/08  . Partial hysterectomy--1979    . Thoracentesis   12/20/07      Social History   Socioeconomic History  . Marital status: Widowed    Spouse name: Not on file  . Number of children: Not on file  . Years of education: Not on file  . Highest education level: Not on file  Occupational History  . Not on file  Tobacco Use  . Smoking status: Never Smoker  . Smokeless tobacco: Never Used  Vaping Use  . Vaping Use: Never used  Substance and Sexual Activity  . Alcohol use: No    Alcohol/week: 0.0 standard drinks  . Drug use: No  . Sexual activity: Never  Other Topics Concern  . 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 Full Code ( reviewed 2015)       Occasional exercise.   Diet: fruits and veggies, lean meats.   Social Determinants of Health   Financial  Resource Strain: Not on file  Food Insecurity: Not on file  Transportation Needs: Not on file  Physical Activity: Not on file  Stress: Not on file  Social Connections: Not on file  Intimate Partner Violence: Not on file     Family History  Problem Relation Age of Onset  . Diabetes Other   . Breast cancer Paternal Aunt 67  . Heart disease Mother   . Pancreatic cancer Father   . Atrial fibrillation Brother   . Heart disease Brother   . Heart attack Brother      Current Outpatient Medications:  .  B Complex Vitamins (VITAMIN B COMPLEX) TABS, Take 1 tablet by mouth daily. , Disp: , Rfl:  .  cholecalciferol (VITAMIN D3) 25 MCG (1000 UNIT) tablet, Take 1,000 Units by mouth daily., Disp: , Rfl:  .  ELIQUIS 5 MG TABS tablet, TAKE 1 TABLET BY MOUTH TWICE DAILY, Disp: 180 tablet, Rfl: 1 .  hydrALAZINE (APRESOLINE) 50 MG tablet, TAKE 1 TABLET BY MOUTH TWICE DAILY, Disp: 180 tablet, Rfl: 2 .  hydrochlorothiazide (HYDRODIURIL) 25 MG tablet, Take 0.5 tablets (12.5 mg total) by mouth daily., Disp: 15 tablet, Rfl: 4 .  LUMIGAN 0.01 % SOLN, , Disp: , Rfl:  .  losartan (  COZAAR) 100 MG tablet, Take 1 tablet (100 mg total) by mouth daily., Disp: 90 tablet, Rfl: 1   Physical exam:  Vitals:   11/24/20 1041  BP: (!) 157/66  Pulse: 62  Resp: 18  Temp: (!) 96.6 F (35.9 C)  TempSrc: Tympanic  SpO2: 100%  Weight: 151 lb 12.8 oz (68.9 kg)   Physical Exam Constitutional:      General: She is not in acute distress. Eyes:     Extraocular Movements: EOM normal.  Cardiovascular:     Rate and Rhythm: Normal rate and regular rhythm.     Heart sounds: Normal heart sounds.  Pulmonary:     Effort: Pulmonary effort is normal.     Breath sounds: Normal breath sounds.  Abdominal:     General: Bowel sounds are normal.     Palpations: Abdomen is soft.  Lymphadenopathy:     Comments: No palpable cervical, supraclavicular, axillary or inguinal adenopathy   Skin:    General: Skin is warm and dry.   Neurological:     Mental Status: She is alert and oriented to person, place, and time.        CMP Latest Ref Rng & Units 11/11/2020  Glucose 70 - 99 mg/dL 85  BUN 6 - 23 mg/dL 28(H)  Creatinine 0.40 - 1.20 mg/dL 1.24(H)  Sodium 135 - 145 mEq/L 140  Potassium 3.5 - 5.1 mEq/L 4.1  Chloride 96 - 112 mEq/L 107  CO2 19 - 32 mEq/L 28  Calcium 8.4 - 10.5 mg/dL 9.8  Total Protein 6.0 - 8.3 g/dL 7.0  Total Bilirubin 0.2 - 1.2 mg/dL 1.0  Alkaline Phos 39 - 117 U/L 48  AST 0 - 37 U/L 20  ALT 0 - 35 U/L 13   CBC Latest Ref Rng & Units 11/11/2020  WBC 4.0 - 10.5 K/uL 5.4  Hemoglobin 12.0 - 15.0 g/dL 10.6(L)  Hematocrit 36.0 - 46.0 % 32.1(L)  Platelets 150.0 - 400.0 K/uL 183.0     Assessment and plan- Patient is a 80 y.o. female referred for normocytic anemia  Patient has had chronic normocytic anemia with a hemoglobin that fluctuates between 10-11 since 2009.  There has been no clear downtrend in her hemoglobin.  White count and platelets have remained normal.  I suspect anemia secondary to chronic disease.  Her iron studies B12 and TSH from February 2022 were within normal limits.  Since she recently had blood work I will hold off on any blood work today.  We will repeat CBC with differential, CMP, ferritin and iron studies, B1, B6, LDH heptoglobin reticulocyte count and myeloma panel in 2 months and I will see her thereafter   Thank you for this kind referral and the opportunity to participate in the care of this patient   Visit Diagnosis 1. Normocytic anemia     Dr. Randa Evens, MD, MPH [facility] at [facility] [phone] 11/24/2020  12:55 PM

## 2020-11-27 ENCOUNTER — Ambulatory Visit: Payer: Medicare Other | Admitting: Cardiovascular Disease

## 2020-11-27 ENCOUNTER — Encounter: Payer: Self-pay | Admitting: Cardiovascular Disease

## 2020-11-27 ENCOUNTER — Other Ambulatory Visit: Payer: Self-pay

## 2020-11-27 VITALS — BP 164/72 | HR 60 | Ht 63.0 in | Wt 149.2 lb

## 2020-11-27 DIAGNOSIS — I4819 Other persistent atrial fibrillation: Secondary | ICD-10-CM

## 2020-11-27 DIAGNOSIS — I10 Essential (primary) hypertension: Secondary | ICD-10-CM

## 2020-11-27 DIAGNOSIS — E782 Mixed hyperlipidemia: Secondary | ICD-10-CM | POA: Diagnosis not present

## 2020-11-27 DIAGNOSIS — I2510 Atherosclerotic heart disease of native coronary artery without angina pectoris: Secondary | ICD-10-CM

## 2020-11-27 DIAGNOSIS — N1831 Chronic kidney disease, stage 3a: Secondary | ICD-10-CM | POA: Diagnosis not present

## 2020-11-27 DIAGNOSIS — Z79899 Other long term (current) drug therapy: Secondary | ICD-10-CM | POA: Diagnosis not present

## 2020-11-27 MED ORDER — CHLORTHALIDONE 25 MG PO TABS: 25.0000 mg | ORAL_TABLET | Freq: Every day | ORAL | 3 refills | Status: DC

## 2020-11-27 NOTE — Progress Notes (Signed)
Hypertension Clinic Follow up    Date:  11/27/2020   ID:  [name], DOB December 27, 1940, MRN [id number]  PCP:  [name], MD  Cardiologist:  [name], MD  Nephrologist:  Referring MD: [name], MD   CC: Hypertension  History of Present Illness:    [name] is a 80 y.o. female with a hx of persistent atrial fibrillation status post ablation, bradycardia, CKD 2, mitral regurgitation, aortic regurgitation, hypertension, and hyperlipidemia here for follow up. She had an echo 05/14/2019 that revealed LVEF 60 to 65% with grade 2 diastolic dysfunction and mildly elevated pulmonary pressures.  She was initially seen 3/202 to establish care in the hypertension clinic.  She was first diagnosed with hypertension many decades ago.  In the past it was well-controlled.  However in the last year she has struggled.  Lately her blood pressure has been in the 160s at home.  She last saw [name] on 11/22/2019.  Prior to that losartan was increased to 50 mg.  Her blood pressure remained above goal since making that change.  [name] increased losartan to 100mg .  She followed up with [name], NP, and hydralazine 25mg  bid was added on 2/26.  At her first visit hydralazine was increased.  She saw [name], NP on 12/2019 an dit was increased to 75mg .  She started to work out at the [employer].  She was seen in the ED with palpitations 03/2020.  She was in sinus rhythm at the time.  This has resolved.   It was noted that she had inadvertently stopped taking her hydrochlorothiazide.  She this was resumed when she saw her pharmacist on 03/2020.  Hydralazine was reduced back to 50 mg twice daily.  Since then her BP has been well-controlled.  She has been struggling with pain in her leg.  When the pain is there her blood pressure is higher.  She injured it at the exercise program at [facility] which she really likes.  She is doing some physical therapy and this seems to be helping.  She has no  exertional chest pain and her breathing is stable.  She denies lower extremity edema, orthopnea, or PND.  At her last appointment her lipids were poorly controlled.  However she went to work on diet and exercise prior to starting medication.  She has been seen by hematology and was felt to have anemia of chronic disease.  Since her last appointment she has been doing well.  She notes that her BP has been running a little high.  She has been doing some changes around the house.  She has been a little stressed.  Her BP has been running a little high.  It has been in the 140s/70s.  She has noted occasional palpitations that last for a second or 2 and do not bother her.  She has no chest pain and her breathing is stable.  She denies lower extremity edema, orthopnea, or PND.  Previous antihypertensives: Amlodipine- ankle swelling Spironolactone- unsure why it was stopped Hydralazine- swelling with 100mg   Past Medical History:  Diagnosis Date  . Anemia   . Coronary artery disease, non-occlusive    a. cath 2/09: no CAD, EF 70%  . HYPERLIPIDEMIA   . HYPERTENSION   . Mild Aortic insufficiency   . Moderate mitral regurgitation   . Moderate tricuspid regurgitation   . OSTEOPENIA   . PAF (paroxysmal atrial fibrillation) ([place])    a. s/p ablation 2013;  b. CHADS2VASc -> 4 (HTN, age x 2, female)-->Eliquis.  Marland Kitchen PAT (paroxysmal atrial tachycardia) ([place])    a. 04/2019 Zio: Occas PACs and rare PVCs. 21 atrial runs - longest 20 beats, max rate 169.  Marland Kitchen Pneumonia 2009  . RA (rheumatoid arthritis) ([place])   . Sinus Bradycardia    a. asymptomatic but prevents use of AVN blocking agents; b. 04/2019 Zio: Avg HR 61 (37-109).  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 Valvular heart disease    a. 05/2019 Echo: EF 60-65%. DD. RVSP 41.69mmHg. Mod dil LA. Mod MR/TR. Mild AI.    Past Surgical History:  Procedure Laterality Date  . ABLATION OF DYSRHYTHMIC FOCUS    . CARDIAC CATHETERIZATION    . COLONOSCOPY  04/18/08  . Partial  hysterectomy--1979    . Thoracentesis   12/20/07      Current Medications: Current Meds  Medication Sig  . B Complex Vitamins (VITAMIN B COMPLEX) TABS Take 1 tablet by mouth daily.   . chlorthalidone (HYGROTON) 25 MG tablet Take 1 tablet (25 mg total) by mouth daily.  . cholecalciferol (VITAMIN D3) 25 MCG (1000 UNIT) tablet Take 1,000 Units by mouth daily.  Marland Kitchen ELIQUIS 5 MG TABS tablet TAKE 1 TABLET BY MOUTH TWICE DAILY  . hydrALAZINE (APRESOLINE) 50 MG tablet TAKE 1 TABLET BY MOUTH TWICE DAILY  . losartan (COZAAR) 100 MG tablet Take 1 tablet (100 mg total) by mouth daily.  Marland Kitchen LUMIGAN 0.01 % SOLN Place into both eyes 2 (two) times daily.  . [DISCONTINUED] hydrochlorothiazide (HYDRODIURIL) 25 MG tablet Take 0.5 tablets (12.5 mg total) by mouth daily.     Allergies:   Amlodipine, Celecoxib, and Tramadol   Social History   Socioeconomic History  . Marital status: Widowed    Spouse name: Not on file  . Number of children: Not on file  . Years of education: Not on file  . Highest education level: Not on file  Occupational History  . Not on file  Tobacco Use  . Smoking status: Never Smoker  . Smokeless tobacco: Never Used  Vaping Use  . Vaping Use: Never used  Substance and Sexual Activity  . Alcohol use: No    Alcohol/week: 0.0 standard drinks  . Drug use: No  . Sexual activity: Never  Other Topics Concern  . 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 Full Code ( reviewed 2015)       Occasional exercise.   Diet: fruits and veggies, lean meats.   Social Determinants of Health    Financial Resource Strain: Not on file  Food Insecurity: Not on file  Transportation Needs: Not on file  Physical Activity: Not on file  Stress: Not on file  Social Connections: Not on file     Family History: The patient's family history includes Atrial fibrillation in her brother; Breast cancer (age of onset: 70) in her paternal aunt; Diabetes in an other family member; Heart attack in her brother; Heart disease in her brother and mother; Pancreatic cancer in her father.  ROS:   Please see the history of present illness.    All other systems reviewed  and are negative.  EKGs/Labs/Other Studies Reviewed:    EKG:  EKG is ordered today.  The ekg ordered 12/06/19 demonstrates sinus bradycardia.  Rate 53 bpm.  Non-specific ST-T changes. 11/27/2020: Sinus rhythm.  Rate 60 bpm.  Echo 05/14/19:  1. The left ventricle has normal systolic function with an ejection  fraction of 60-65%. The cavity size was normal. There is mildly increased  left ventricular wall thickness. Left ventricular diastolic Doppler  parameters are consistent with  pseudonormalization.  2. The right ventricle has normal systolic function. The cavity was  normal. There is no increase in right ventricular wall thickness. Right  ventricular systolic pressure is mildly elevated with an estimated  pressure of 41.7 mmHg.  3. Left atrial size was moderately dilated.  4. Incompletely evaluated hypoechoic structure noted in the liver, most  likely a cyst.  5. The mitral valve is degenerative. Mild thickening of the mitral valve  leaflet. Mitral valve regurgitation is moderate by color flow Doppler.  6. Tricuspid valve regurgitation is moderate.  7. The aortic valve has an indeterminate number of cusps. Mild thickening  of the aortic valve. Mild calcification of the aortic valve. Aortic valve  regurgitation is mild by color flow Doppler.  8. The aorta is normal in size and structure.   Recent Labs: 11/11/2020: ALT  13; BUN 28; Creatinine, Ser 1.24; Hemoglobin 10.6; Platelets 183.0; Potassium 4.1; Sodium 140; TSH 1.95   Recent Lipid Panel    Component Value Date/Time   CHOL 223 (H) 07/25/2019 0905   TRIG 57.0 07/25/2019 0905   HDL 76.40 07/25/2019 0905   CHOLHDL 3 07/25/2019 0905   VLDL 11.4 07/25/2019 0905   LDLCALC 135 (H) 07/25/2019 0905   LDLDIRECT 104.4 11/26/2012 0746    Physical Exam:    VS:  BP (!) 164/72 (BP Location: Left Arm, Patient Position: Sitting)   Pulse 60   Ht 5\' 3"  (1.6 m)   Wt 149 lb 3.2 oz (67.7 kg)   SpO2 96%   BMI 26.43 kg/m  , BMI Body mass index is 26.43 kg/m. GENERAL:  Well appearing HEENT: Pupils equal round and reactive, fundi not visualized, oral mucosa unremarkable NECK:  No jugular venous distention, waveform within normal limits, carotid upstroke brisk and symmetric, no bruits LUNGS:  Clear to auscultation bilaterally HEART:  RRR.  PMI not displaced or sustained,S1 and S2 within normal limits, no S3, no S4, no clicks, no rubs, III/VI systolic murmurs ABD:  Flat, positive bowel sounds normal in frequency in pitch, no bruits, no rebound, no guarding, no midline pulsatile mass, no hepatomegaly, no splenomegaly EXT:  2 plus pulses throughout, no edema, no cyanosis no clubbing SKIN:  No rashes no nodules NEURO:  Cranial nerves II through XII grossly intact, motor grossly intact throughout PSYCH:  Cognitively intact, oriented to person place and time   ASSESSMENT:    1. Essential hypertension   2. Medication management   3. Mixed hyperlipidemia   4. Coronary artery disease, non-occlusive   5. Persistent atrial fibrillation (HCC)   6. Stage 3a chronic kidney disease ([place])     PLAN:    # Essential hypertension: Blood pressure was well controlled but is elevated again today.  She notes some life stressors but otherwise has been stable.  We will switch hydrochlorothiazide to chlorthalidone 25 mg daily.  Check a CMP in a week.  # Persistent atrial  fibrillation: Maintaining sinus rhythm since ablation.  Continue Eliquis.  # Bradycardia:  She is asymptomatic.  We  will not add any nodal agents.  # Hyperlipidemia:  ASCVD 10 year risk 27%.  She wanted to work on diet and exercise.  She is due to have repeat fasting lipids and CMP.   Disposition:    FU with MD/PharmD in 1-2 months     Medication Adjustments/Labs and Tests Ordered: Current medicines are reviewed at length with the patient today.  Concerns regarding medicines are outlined above.  Orders Placed This Encounter  Procedures  . Lipid panel  . Comprehensive metabolic panel  . AMB Referral to [facility] Pharm-D  . EKG 12-Lead   Meds ordered this encounter  Medications  . chlorthalidone (HYGROTON) 25 MG tablet    Sig: Take 1 tablet (25 mg total) by mouth daily.    Dispense:  90 tablet    Refill:  3    D/C HCTZ     Signed, [name], MD  11/27/2020 2:40 PM    [place]

## 2020-11-27 NOTE — Patient Instructions (Signed)
Medication Instructions:  STOP HYDROCHLOROTHIAZIDE   START CHLORTHALIDONE 25 MG DAILY   *If you need a refill on your cardiac medications before your next appointment, please call your pharmacy*  Lab Work: FASTING LP/CMET IN 1 WEEK   If you have labs (blood work) drawn today and your tests are completely normal, you will receive your results only by: Marland Kitchen MyChart Message (if you have MyChart) OR . A paper copy in the mail If you have any lab test that is abnormal or we need to change your treatment, we will call you to review the results.  Testing/Procedures: NONE   Follow-Up: At [facility], you and your health needs are our priority.  As part of our continuing mission to provide you with exceptional heart care, we have created designated Provider Care Teams.  These Care Teams include your primary Cardiologist (physician) and Advanced Practice Providers (APPs -  Physician Assistants and Nurse Practitioners) who all work together to provide you with the care you need, when you need it.  We recommend signing up for the patient portal called "MyChart".  Sign up information is provided on this After Visit Summary.  MyChart is used to connect with patients for Virtual Visits (Telemedicine).  Patients are able to view lab/test results, encounter notes, upcoming appointments, etc.  Non-urgent messages can be sent to your provider as well.   To learn more about what you can do with MyChart, go to [email or web address].    Your next appointment:   4 month(s)  The format for your next appointment:   In Person  Provider:   You may see [name], MD or one of the following Advanced Practice Providers on your designated Care Team:    Kerin Ransom, PA-C  [place], [place]  Coletta Memos,   Your physician recommends that you schedule a follow-up appointment in: [place]

## 2020-12-03 DIAGNOSIS — R197 Diarrhea, unspecified: Secondary | ICD-10-CM | POA: Insufficient documentation

## 2020-12-03 NOTE — Assessment & Plan Note (Addendum)
Unclear cause. Meds ordered this encounter  Medications  . DISCONTD: cephALEXin (KEFLEX) 500 MG capsule    Sig: Take 1 capsule (500 mg total) by mouth 3 (three) times daily.    Dispense:  21 capsule    Refill:  0   Eval with labs  Including GI panel.  Push fluids to avoid dehydration.

## 2020-12-03 NOTE — Assessment & Plan Note (Signed)
She will come in for further testing including UA and micro.

## 2020-12-14 ENCOUNTER — Other Ambulatory Visit: Payer: Self-pay | Admitting: Cardiovascular Disease

## 2020-12-20 NOTE — Assessment & Plan Note (Signed)
UA clear in office today. 

## 2020-12-20 NOTE — Assessment & Plan Note (Signed)
MMSE: short term recall diminished.  28/30 Animal recall:  6 in 30 sec Clock drawing: normal  Eval for secondary cause with labs. Refer to neuro for further eval,

## 2020-12-21 ENCOUNTER — Telehealth: Payer: Self-pay

## 2020-12-21 NOTE — Telephone Encounter (Signed)
lmom for labs 

## 2020-12-22 ENCOUNTER — Other Ambulatory Visit: Payer: Self-pay

## 2020-12-22 ENCOUNTER — Other Ambulatory Visit
Admission: RE | Admit: 2020-12-22 | Discharge: 2020-12-22 | Disposition: A | Discharge status: Home / Self Care | Payer: Medicare Other | Attending: Cardiovascular Disease | Admitting: Cardiovascular Disease

## 2020-12-22 DIAGNOSIS — N1831 Chronic kidney disease, stage 3a: Secondary | ICD-10-CM | POA: Insufficient documentation

## 2020-12-22 DIAGNOSIS — I2510 Atherosclerotic heart disease of native coronary artery without angina pectoris: Secondary | ICD-10-CM | POA: Insufficient documentation

## 2020-12-22 DIAGNOSIS — I10 Essential (primary) hypertension: Secondary | ICD-10-CM | POA: Insufficient documentation

## 2020-12-22 DIAGNOSIS — E782 Mixed hyperlipidemia: Secondary | ICD-10-CM | POA: Diagnosis not present

## 2020-12-22 DIAGNOSIS — Z79899 Other long term (current) drug therapy: Secondary | ICD-10-CM | POA: Insufficient documentation

## 2020-12-22 DIAGNOSIS — I4819 Other persistent atrial fibrillation: Secondary | ICD-10-CM | POA: Insufficient documentation

## 2020-12-22 LAB — LIPID PANEL
Cholesterol: 233 mg/dL — ABNORMAL HIGH (ref 0–200)
HDL: 90 mg/dL (ref >40)
LDL Cholesterol: 134 mg/dL — ABNORMAL HIGH (ref 0–99)
Total CHOL/HDL Ratio: 2.6 RATIO
Triglycerides: 47 mg/dL (ref <150)
VLDL: 9 mg/dL (ref 0–40)

## 2020-12-22 LAB — COMPREHENSIVE METABOLIC PANEL
ALT: 14 U/L (ref 0–44)
AST: 24 U/L (ref 15–41)
Albumin: 4.5 g/dL (ref 3.5–5.0)
Alkaline Phosphatase: 49 U/L (ref 38–126)
Anion gap: 7 (ref 5–15)
BUN: 33 mg/dL — ABNORMAL HIGH (ref 8–23)
CO2: 22 mmol/L (ref 22–32)
Calcium: 9.9 mg/dL (ref 8.9–10.3)
Chloride: 109 mmol/L (ref 98–111)
Creatinine, Ser: 1.27 mg/dL — ABNORMAL HIGH (ref 0.44–1.00)
GFR, Estimated: 43 mL/min — ABNORMAL LOW (ref >60)
Glucose, Bld: 98 mg/dL (ref 70–99)
Potassium: 4.2 mmol/L (ref 3.5–5.1)
Sodium: 138 mmol/L (ref 135–145)
Total Bilirubin: 1.1 mg/dL (ref 0.3–1.2)
Total Protein: 8 g/dL (ref 6.5–8.1)

## 2020-12-25 ENCOUNTER — Encounter: Payer: Self-pay | Admitting: Pharmacist

## 2020-12-25 ENCOUNTER — Other Ambulatory Visit: Payer: Self-pay

## 2020-12-25 ENCOUNTER — Ambulatory Visit (INDEPENDENT_AMBULATORY_CARE_PROVIDER_SITE_OTHER): Payer: Medicare Other | Admitting: Pharmacist

## 2020-12-25 VITALS — BP 176/70 | HR 50

## 2020-12-25 DIAGNOSIS — I10 Essential (primary) hypertension: Secondary | ICD-10-CM | POA: Diagnosis not present

## 2020-12-25 MED ORDER — HYDRALAZINE HCL 50 MG PO TABS: 50.0000 mg | ORAL_TABLET | Freq: Three times a day (TID) | ORAL | 2 refills | Status: DC

## 2020-12-25 NOTE — Patient Instructions (Addendum)
It was nice meeting you today!  We would like your blood pressure to be less than 130/80  Continue your chlorthalidone 25mg  once a day Continue your losartan 100mg  once a day Increase hydralazine to 50mg  three times a day  Continue to monitor your blood pressure at home and let us know if there are any problems  We will see you back in 4 weeks  Karren Cobble, PharmD, BCACP, [place], [place] [address] [place], [place] [address] Phone: [phone]; Fax: [phone] 12/25/2020 2:36 PM

## 2020-12-25 NOTE — Progress Notes (Signed)
Patient ID: [name]                 DOB: 02/20/1941                      MRN: [id number]     HPI: [name] is a 80 y.o. female referred by Dr. Oval Linsey to pharmacist follow up as part of HTN clinic. PMH includes persistent A.Fib s/p ablation, hypertension, bradycardia, CKD-2, mitral regusgitation, and hyperlipidemia. EF 60-65% per ECHO performed August/2020. Denies pain, dizziness,or lightheaded. Reports some increased SOB since Friday and unable to go back to the gym since. Palpitations resolved after ED visit on 03/18/2020. Noted significant lower extremity edema (bilateral).   Patient presents today with daughter in good spirits. This is her first visit to Adv HTN clinic.  Has been taking her BP at home and brought log.  BP readings from past week: 139/76 139/72 123/73 129/75 138/60 125/75  Has a blood pressure cuff at home but shares it with her husband so sometimes gets confused about who's readings are which.  Was previously very active by exercising at [facility] but has not gone recently.  Trys to cook without salt.  Reports her diet is typically cereal for breakfast and lunch and vegetables for dinner.  Has never had alcohol.   Has been tolerating chlorthalidone and last labs on 3/15 were stable.    Current HTN meds: chlorthalidone 25mg  daily, hydralazine 50 mg BID, losartan 100mg  daily  Previously tried: amlodipine, HCTZ, metoprolol 25mg , pindolol, spironolactone 25, triamterene/HCTZ  BP goal: <130/80    Wt Readings from Last 3 Encounters:  11/27/20 149 lb 3.2 oz (67.7 kg)  11/24/20 151 lb 12.8 oz (68.9 kg)  11/10/20 148 lb 8 oz (67.4 kg)   BP Readings from Last 3 Encounters:  11/27/20 (!) 164/72  11/24/20 (!) 157/66  11/10/20 140/62   Pulse Readings from Last 3 Encounters:  11/27/20 60  11/24/20 62  11/10/20 66    Renal function: CrCl cannot be calculated (Unknown ideal weight.).  Past Medical History:  Diagnosis Date  . Anemia   . Coronary  artery disease, non-occlusive    a. cath 2/09: no CAD, EF 70%  . HYPERLIPIDEMIA   . HYPERTENSION   . Mild Aortic insufficiency   . Moderate mitral regurgitation   . Moderate tricuspid regurgitation   . OSTEOPENIA   . PAF (paroxysmal atrial fibrillation) ([place])    a. s/p ablation 2013; b. CHADS2VASc -> 4 (HTN, age x 2, female)-->Eliquis.  Marland Kitchen PAT (paroxysmal atrial tachycardia) ([place])    a. 04/2019 Zio: Occas PACs and rare PVCs. 21 atrial runs - longest 20 beats, max rate 169.  Marland Kitchen Pneumonia 2009  . RA (rheumatoid arthritis) ([place])   . Sinus Bradycardia    a. asymptomatic but prevents use of AVN blocking agents; b. 04/2019 Zio: Avg HR 61 (37-109).  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 Valvular heart disease    a. 05/2019 Echo: EF 60-65%. DD. RVSP 41.72mmHg. Mod dil LA. Mod MR/TR. Mild AI.    Current Outpatient Medications on File Prior to Visit  Medication Sig Dispense Refill  . B Complex Vitamins (VITAMIN B COMPLEX) TABS Take 1 tablet by mouth daily.     . chlorthalidone (HYGROTON) 25 MG tablet Take 1 tablet (25 mg total) by mouth daily. 90 tablet 3  . cholecalciferol (VITAMIN D3) 25 MCG (1000 UNIT) tablet Take 1,000 Units by mouth daily.    Marland Kitchen ELIQUIS 5 MG  TABS tablet TAKE 1 TABLET BY MOUTH TWICE DAILY 180 tablet 1  . hydrALAZINE (APRESOLINE) 50 MG tablet TAKE 1 TABLET BY MOUTH TWICE DAILY 180 tablet 2  . losartan (COZAAR) 100 MG tablet TAKE 1 TABLET BY MOUTH ONCE DAILY 90 tablet 1  . LUMIGAN 0.01 % SOLN Place into both eyes 2 (two) times daily.     No current facility-administered medications on file prior to visit.    Allergies  Allergen Reactions  . Amlodipine     Ankle swelling  . Celecoxib Other (See Comments)    Tachycardia/palpitations  . Tramadol Nausea Only     Assessment/Plan:  1. Hypertension - Patient BP in room today 176/70 which is well above goal.  Rechecked at end of visit: 172/70.  BP has been consistently rising throughout last month despite addition of  chlorthalidone.  However home readings are much better controlled. Recommended daughter purchase her mother her own BP cuff so we can be sure they are her readings and not her husbands.  Patient needs further BP lowering.  Will increase hydralazine from BID to TID and recommended patient continue checking BP at home.  Patient voiced understanding.  Recheck in clinic in 4 weeks.  Continue chlorthalidone 25 mg daily Continue losartan 100mg  daily Increase hydralazine to 50mg  TID Recheck in 4 weeks.  Karren Cobble, PharmD, BCACP, [place], [place] [address] [place], [place] [address] Phone: [phone]; Fax: [phone] 12/25/2020 5:11 PM

## 2021-01-13 ENCOUNTER — Ambulatory Visit (INDEPENDENT_AMBULATORY_CARE_PROVIDER_SITE_OTHER): Payer: Medicare Other | Admitting: Family Medicine

## 2021-01-13 ENCOUNTER — Encounter: Payer: Self-pay | Admitting: Family Medicine

## 2021-01-13 ENCOUNTER — Other Ambulatory Visit: Payer: Self-pay

## 2021-01-13 ENCOUNTER — Ambulatory Visit (INDEPENDENT_AMBULATORY_CARE_PROVIDER_SITE_OTHER)
Admission: RE | Admit: 2021-01-13 | Discharge: 2021-01-13 | Disposition: A | Discharge status: Home / Self Care | Payer: Medicare Other | Source: Ambulatory Visit | Attending: Family Medicine | Admitting: Family Medicine

## 2021-01-13 VITALS — BP 160/84 | HR 67 | Temp 97.6°F | Ht 63.0 in | Wt 148.5 lb

## 2021-01-13 DIAGNOSIS — M7501 Adhesive capsulitis of right shoulder: Secondary | ICD-10-CM | POA: Diagnosis not present

## 2021-01-13 DIAGNOSIS — M25511 Pain in right shoulder: Secondary | ICD-10-CM

## 2021-01-13 DIAGNOSIS — M19011 Primary osteoarthritis, right shoulder: Secondary | ICD-10-CM | POA: Diagnosis not present

## 2021-01-13 IMAGING — DX DG SHOULDER 2+V*R*
3 series · 4 of 4 positions shown · non-contrast
Comparison: Chest x-ray [DATE].

CLINICAL DATA: Loss of motion.

EXAM:
RIGHT SHOULDER - 2+ VIEW

[shoulder axial]
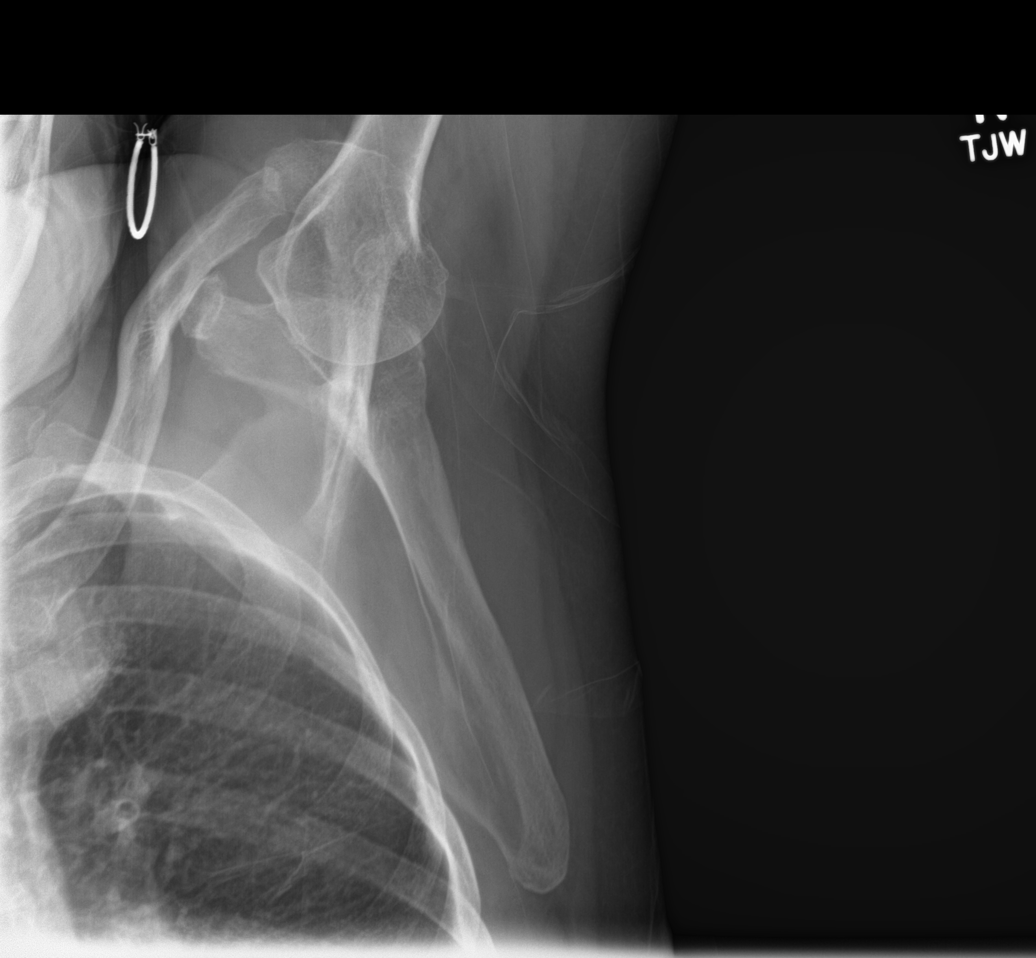

[Series 3: shoulder ap · 0.14mm/px · 2 of 2 slices shown]
[im 1/2]
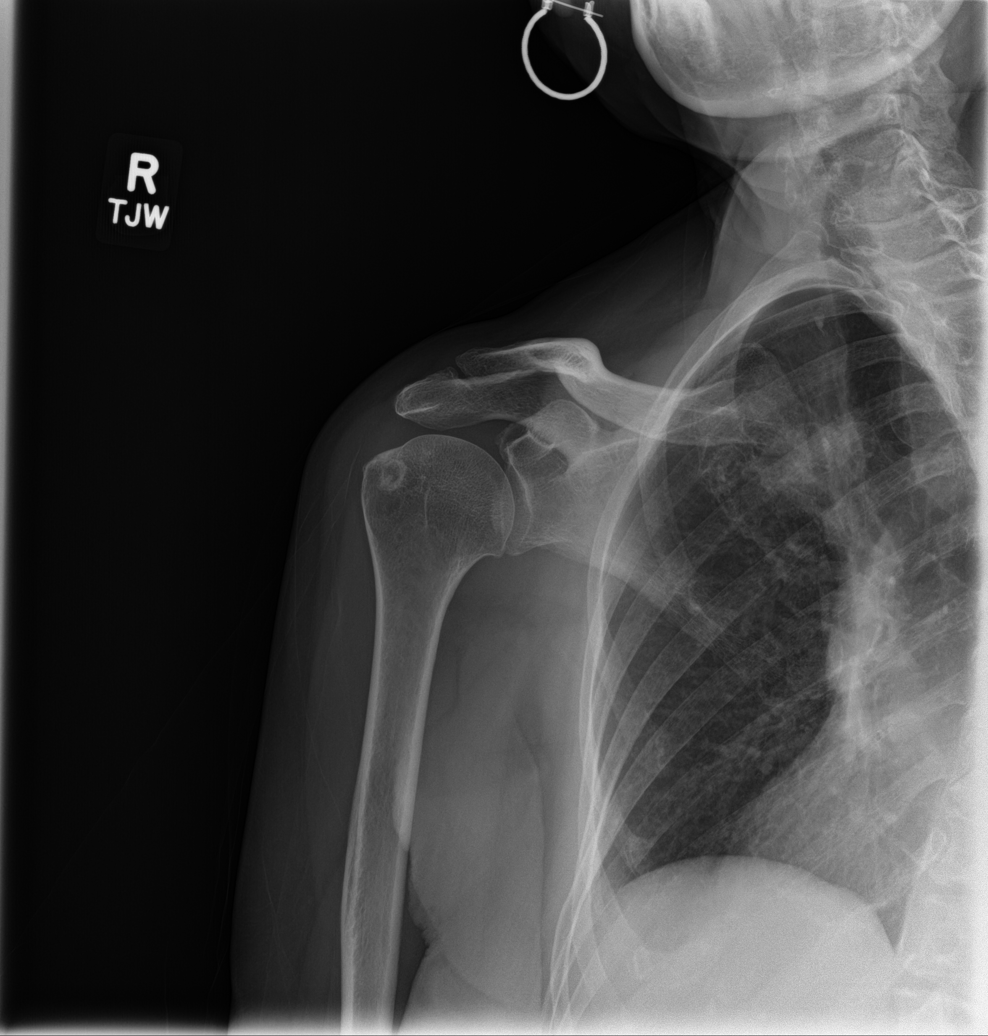
[im 2/2]
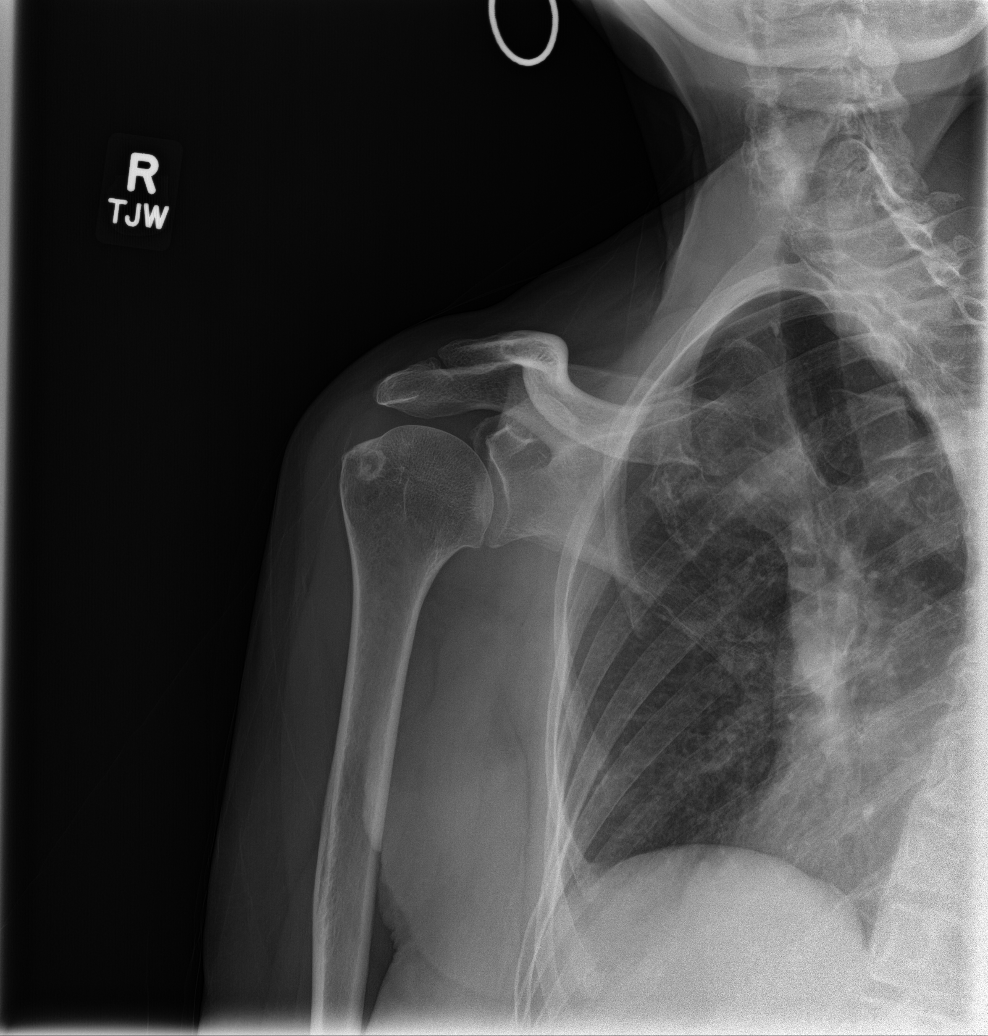

[shoulder y-view]
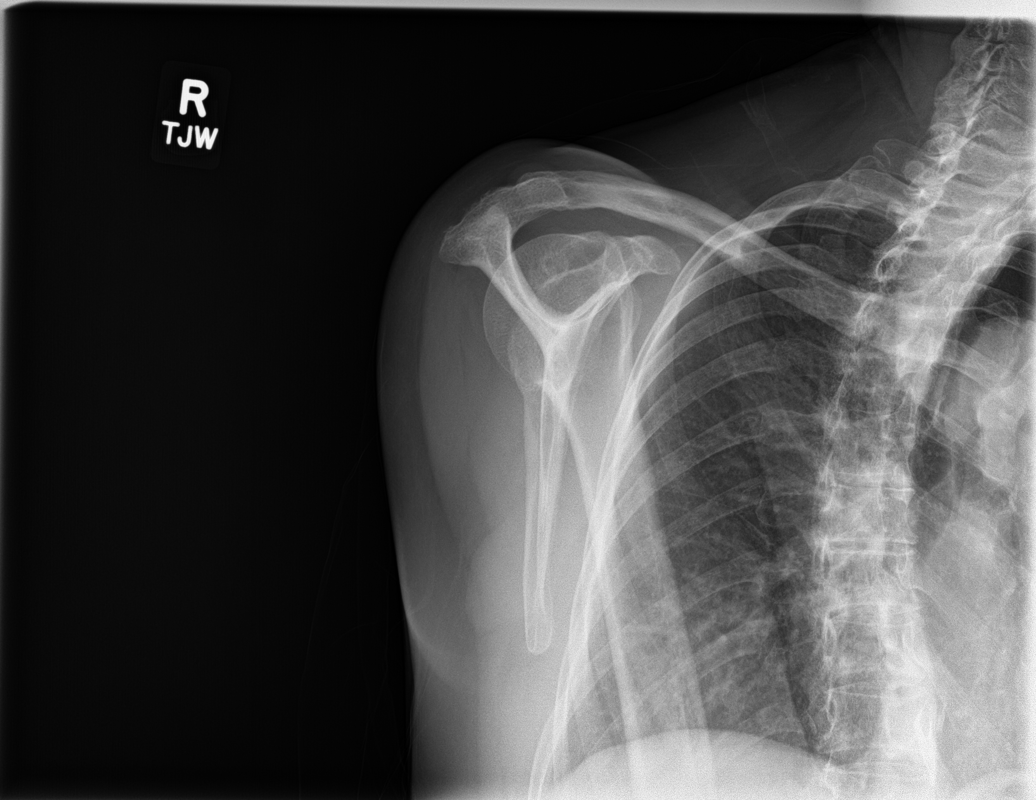

[4 of 4 positions shown; findings below may reference images not displayed]

FINDINGS: No evidence of fracture, dislocation, or separation. Mild
acromioclavicular glenohumeral degenerative change. Subchondral cyst
in the humeral head, most likely degenerative.
IMPRESSION: 1.  Mild acromioclavicular glenohumeral degenerative change.

2.  No acute abnormality identified.

## 2021-01-13 NOTE — Progress Notes (Signed)
Jacqueline Shonka T. Avannah Decker, MD, [place] at [facility] [place], [address]  Phone: [phone]  FAX: [phone]  [name] - 80 y.o. female  MRN [id number]  Date of Birth: 1941-08-13  Date: 01/13/2021  PCP: [name], MD  Referral: [name], MD  Chief Complaint  Patient presents with  . Shoulder Pain    Right    This visit occurred during the SARS-CoV-2 public health emergency.  Safety protocols were in place, including screening questions prior to the visit, additional usage of staff PPE, and extensive cleaning of exam room while observing appropriate contact time as indicated for disinfecting solutions.   Subjective:   [name] is a 80 y.o. very pleasant female patient who presents with the following: shoulder pain  The patient noted above presents with shoulder pain that has been ongoing for 3 months there is no history of trauma or accident. The patient denies neck pain or radicular symptoms. No shoulder blade pain Denies dislocation, subluxation, separation of the shoulder. The patient does complain of pain with flexion, abduction, and terminal motion.  Significant restriction of motion. she describes a deep ache around the shoulder, and sometimes it will wake the patient up at night.  No major fx, etc in the past.   Ice or Heat: minimal help, not really tried Tried PT: No  Prior shoulder Injury: No Prior surgery: No Prior fracture: No   Review of Systems is noted in the HPI, as appropriate  Objective:   Blood pressure (!) 160/84, pulse 67, temperature 97.6 F (36.4 C), temperature source Temporal, height 5\' 3"  (1.6 m), weight 148 lb 8 oz (67.4 kg), SpO2 97 %.   Shoulder: R and L Inspection: No muscle wasting or winging Ecchymosis/edema: neg  AC joint, scapula, clavicle: NT Cervical spine: NT, full ROM Spurling's: neg ABNORMAL SIDE  TESTED: Right UNLESS OTHERWISE NOTED, THE CONTRALATERAL SIDE HAS FULL RANGE OF MOTION. Abduction: 5/5, LIMITED TO 135 DEGREES Flexion: 5/5, LIMITED TO 135 DEGNO ROM  IR, lift-off: 5/5. TESTED AT 90 DEGREES OF ABDUCTION, LIMITED TO 0 DEGREES ER at neutral:  5/5, TESTED AT 90 DEGREES OF ABDUCTION, LIMITED TO 12 DEGREES AC crossover and compression: PAIN Drop Test: neg Empty Can: neg Supraspinatus insertion: NT Bicipital groove: NT ALL OTHER SPECIAL TESTING EQUIVOCAL GIVEN LOSS OF MOTION C5-T1 intact Sensation intact Grip 5/5   Assessment and Plan:     ICD-10-CM   1. Adhesive capsulitis of right shoulder  M75.01   2. Acute pain of right shoulder  M25.511 DG Shoulder Right   She does have some minimal glenohumeral arthritis seen on her shoulder x-ray, and she does have some mild acromioclavicular arthritis as well.  There also does appear to be a bone cyst in the proximal humerus.  I am can await the formal radiology review for confirmation or any concerns.  Patient was given a systematic ROM protocol from [facility] to be done daily. Emphasized importance of adherence, help of PT, daily HEP.  The average length of total symptoms is 12-18 months going through 3 different phases in the freezing and thawing process. There can be a permanent loss of motion in some cases.   Intraarticular shoulder injections discussed with patient, which have good evidence for accelerating the thawing phase.  At this point, we are going to hold off and see how she does over the next couple of months.  Social: This is limiting her ability to do some basic self-care and inhibiting her sleeping patterns due to pain.  Follow-up: Return in about 2 months (around 03/15/2021).   Orders Placed This Encounter  Procedures  . DG Shoulder Right    Signed,  Frederico Hamman T. Daxtin Leiker, MD   Outpatient Encounter Medications as of 01/13/2021  Medication Sig  . B Complex Vitamins (VITAMIN B COMPLEX) TABS Take 1 tablet by  mouth daily.   . chlorthalidone (HYGROTON) 25 MG tablet Take 1 tablet (25 mg total) by mouth daily.  . cholecalciferol (VITAMIN D3) 25 MCG (1000 UNIT) tablet Take 1,000 Units by mouth daily.  Marland Kitchen ELIQUIS 5 MG TABS tablet TAKE 1 TABLET BY MOUTH TWICE DAILY  . hydrALAZINE (APRESOLINE) 50 MG tablet Take 1 tablet (50 mg total) by mouth 3 (three) times daily.  Marland Kitchen losartan (COZAAR) 100 MG tablet TAKE 1 TABLET BY MOUTH ONCE DAILY  . LUMIGAN 0.01 % SOLN Place into both eyes 2 (two) times daily.   No facility-administered encounter medications on file as of 01/13/2021.

## 2021-01-14 ENCOUNTER — Telehealth: Payer: Self-pay | Admitting: *Deleted

## 2021-01-14 DIAGNOSIS — Z5181 Encounter for therapeutic drug level monitoring: Secondary | ICD-10-CM

## 2021-01-14 DIAGNOSIS — I10 Essential (primary) hypertension: Secondary | ICD-10-CM

## 2021-01-14 DIAGNOSIS — E782 Mixed hyperlipidemia: Secondary | ICD-10-CM

## 2021-01-14 MED ORDER — ROSUVASTATIN CALCIUM 10 MG PO TABS: 10.0000 mg | ORAL_TABLET | Freq: Every day | ORAL | 3 refills | Status: DC

## 2021-01-14 NOTE — Telephone Encounter (Signed)
-----   Message from [name], MD sent at 01/12/2021  5:24 PM EDT ----- Renal function mildly abnormal but stable.  Liver function normal.  Cholesterol levels are essentially unchanged from a year ago.  Given that there has been no change recommend starting rosuvastatin 10 mg daily to reduce her risk of heart attack and stroke in the future.  Repeat lipids and a CMP in 3 months.

## 2021-01-14 NOTE — Telephone Encounter (Signed)
Advised patient of lab results, Rx sent to pharmacy, and mailed lab orders for 3 months

## 2021-01-22 ENCOUNTER — Ambulatory Visit: Payer: Medicare Other

## 2021-01-22 ENCOUNTER — Inpatient Hospital Stay: Payer: Medicare Other | Attending: Oncology

## 2021-01-22 DIAGNOSIS — I2510 Atherosclerotic heart disease of native coronary artery without angina pectoris: Secondary | ICD-10-CM | POA: Diagnosis not present

## 2021-01-22 DIAGNOSIS — Z800 Family history of malignant neoplasm of digestive organs: Secondary | ICD-10-CM | POA: Diagnosis not present

## 2021-01-22 DIAGNOSIS — N183 Chronic kidney disease, stage 3 unspecified: Secondary | ICD-10-CM | POA: Insufficient documentation

## 2021-01-22 DIAGNOSIS — I480 Paroxysmal atrial fibrillation: Secondary | ICD-10-CM | POA: Diagnosis not present

## 2021-01-22 DIAGNOSIS — D649 Anemia, unspecified: Secondary | ICD-10-CM | POA: Diagnosis not present

## 2021-01-22 DIAGNOSIS — Z803 Family history of malignant neoplasm of breast: Secondary | ICD-10-CM | POA: Diagnosis not present

## 2021-01-22 DIAGNOSIS — I129 Hypertensive chronic kidney disease with stage 1 through stage 4 chronic kidney disease, or unspecified chronic kidney disease: Secondary | ICD-10-CM | POA: Diagnosis not present

## 2021-01-22 DIAGNOSIS — R197 Diarrhea, unspecified: Secondary | ICD-10-CM | POA: Insufficient documentation

## 2021-01-22 LAB — CBC WITH DIFFERENTIAL/PLATELET
Abs Immature Granulocytes: 0.03 10*3/uL (ref 0.00–0.07)
Basophils Absolute: 0.1 10*3/uL (ref 0.0–0.1)
Basophils Relative: 1 %
Eosinophils Absolute: 0.1 10*3/uL (ref 0.0–0.5)
Eosinophils Relative: 2 %
HCT: 30.9 % — ABNORMAL LOW (ref 36.0–46.0)
Hemoglobin: 10.3 g/dL — ABNORMAL LOW (ref 12.0–15.0)
Immature Granulocytes: 1 %
Lymphocytes Relative: 38 %
Lymphs Abs: 2.1 10*3/uL (ref 0.7–4.0)
MCH: 30.7 pg (ref 26.0–34.0)
MCHC: 33.3 g/dL (ref 30.0–36.0)
MCV: 92.2 fL (ref 80.0–100.0)
Monocytes Absolute: 0.5 10*3/uL (ref 0.1–1.0)
Monocytes Relative: 9 %
Neutro Abs: 2.8 10*3/uL (ref 1.7–7.7)
Neutrophils Relative %: 49 %
Platelets: 178 10*3/uL (ref 150–400)
RBC: 3.35 MIL/uL — ABNORMAL LOW (ref 3.87–5.11)
RDW: 12.9 % (ref 11.5–15.5)
WBC: 5.5 10*3/uL (ref 4.0–10.5)
nRBC: 0 % (ref 0.0–0.2)

## 2021-01-22 LAB — IRON AND TIBC
Iron: 69 ug/dL (ref 28–170)
Saturation Ratios: 22 % (ref 10.4–31.8)
TIBC: 309 ug/dL (ref 250–450)
UIBC: 240 ug/dL

## 2021-01-22 LAB — RETICULOCYTES
Immature Retic Fract: 4.3 % (ref 2.3–15.9)
RBC.: 3.3 MIL/uL — ABNORMAL LOW (ref 3.87–5.11)
Retic Count, Absolute: 36.6 10*3/uL (ref 19.0–186.0)
Retic Ct Pct: 1.1 % (ref 0.4–3.1)

## 2021-01-22 LAB — COMPREHENSIVE METABOLIC PANEL
ALT: 14 U/L (ref 0–44)
AST: 23 U/L (ref 15–41)
Albumin: 4.6 g/dL (ref 3.5–5.0)
Alkaline Phosphatase: 49 U/L (ref 38–126)
Anion gap: 8 (ref 5–15)
BUN: 35 mg/dL — ABNORMAL HIGH (ref 8–23)
CO2: 23 mmol/L (ref 22–32)
Calcium: 9.4 mg/dL (ref 8.9–10.3)
Chloride: 106 mmol/L (ref 98–111)
Creatinine, Ser: 1.52 mg/dL — ABNORMAL HIGH (ref 0.44–1.00)
GFR, Estimated: 35 mL/min — ABNORMAL LOW (ref >60)
Glucose, Bld: 120 mg/dL — ABNORMAL HIGH (ref 70–99)
Potassium: 3.9 mmol/L (ref 3.5–5.1)
Sodium: 137 mmol/L (ref 135–145)
Total Bilirubin: 0.6 mg/dL (ref 0.3–1.2)
Total Protein: 7.7 g/dL (ref 6.5–8.1)

## 2021-01-22 LAB — FERRITIN: Ferritin: 89 ng/mL (ref 11–307)

## 2021-01-22 LAB — LACTATE DEHYDROGENASE: LDH: 141 U/L (ref 98–192)

## 2021-01-22 NOTE — Progress Notes (Deleted)
01/22/2021 CASSIA FEIN 10-Mar-1941 [id number]   HPI:  [name] is a 80 y.o. female patient of Dr Oval Linsey, with a PMH below who presents today for advanced hypertension clinic follow up.  At her original visit with Dr. Oval Linsey her pressure was elevated at 164/72 and she noted that her hypertension had been diagnosed decades ago.  It had been well controlled for many years, but recently had been higher.  At most recent visit with Karren Cobble PharmD, pressure was still elevated at 176/70, although home readings from the previous week were much lower with systolic average of 177.  There was some confusion as to whether readings were correct, as patient shared cuff and some of the readings may not have been hers.  Hydralazine was increased to 50 mg tid from bid) and she was asked to return in 4 weeks for follow up.    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  hyperlipidemia LDL 134 on rosuvastatin 10  bradycardia Pulse at 50 at last visit, avoid beta blockers, diltiazem     Blood Pressure Goal:  130/80  Current Medications: chlorthalidone 25 mg qd, hydralazine 50 mg tid, losartan 100 mg qd,   Family Hx: AF, MI in brother, mother with heart disease  Social Hx:   Diet:   Exercise:   Home BP readings:   Intolerances: amlodipine - edema, spironolactone - unsure why stopped, hydralazine - swelling with 100 mg doses  Labs: 12/22/20: Na 138, K 4.2, Glu 98, BUN 33, SCr 1.27, GFR 43   Wt Readings from Last 3 Encounters:  01/13/21 148 lb 8 oz (67.4 kg)  11/27/20 149 lb 3.2 oz (67.7 kg)  11/24/20 151 lb 12.8 oz (68.9 kg)   BP Readings from Last 3 Encounters:  01/13/21 (!) 160/84  12/25/20 (!) 176/70  11/27/20 (!) 164/72   Pulse Readings from Last 3 Encounters:  01/13/21 67  12/25/20 (!) 50  11/27/20 60    Current Outpatient Medications  Medication Sig Dispense Refill  . B Complex Vitamins  (VITAMIN B COMPLEX) TABS Take 1 tablet by mouth daily.     . chlorthalidone (HYGROTON) 25 MG tablet Take 1 tablet (25 mg total) by mouth daily. 90 tablet 3  . cholecalciferol (VITAMIN D3) 25 MCG (1000 UNIT) tablet Take 1,000 Units by mouth daily.    Marland Kitchen ELIQUIS 5 MG TABS tablet TAKE 1 TABLET BY MOUTH TWICE DAILY 180 tablet 1  . hydrALAZINE (APRESOLINE) 50 MG tablet Take 1 tablet (50 mg total) by mouth 3 (three) times daily. 90 tablet 2  . losartan (COZAAR) 100 MG tablet TAKE 1 TABLET BY MOUTH ONCE DAILY 90 tablet 1  . LUMIGAN 0.01 % SOLN Place into both eyes 2 (two) times daily.    . rosuvastatin (CRESTOR) 10 MG tablet Take 1 tablet (10 mg total) by mouth daily. 90 tablet 3   No current facility-administered medications for this visit.    Allergies  Allergen Reactions  . Amlodipine     Ankle swelling  . Celecoxib Other (See Comments)    Tachycardia/palpitations  . Tramadol Nausea Only    Past Medical History:  Diagnosis Date  . Anemia   . Coronary artery disease, non-occlusive    a. cath 2/09: no CAD, EF 70%  . HYPERLIPIDEMIA   . HYPERTENSION   . Mild Aortic insufficiency   . Moderate mitral regurgitation   . Moderate tricuspid regurgitation   .  OSTEOPENIA   . PAF (paroxysmal atrial fibrillation) ([place])    a. s/p ablation 2013; b. CHADS2VASc -> 4 (HTN, age x 2, female)-->Eliquis.  Marland Kitchen PAT (paroxysmal atrial tachycardia) ([place])    a. 04/2019 Zio: Occas PACs and rare PVCs. 21 atrial runs - longest 20 beats, max rate 169.  Marland Kitchen Pneumonia 2009  . RA (rheumatoid arthritis) ([place])   . Sinus Bradycardia    a. asymptomatic but prevents use of AVN blocking agents; b. 04/2019 Zio: Avg HR 61 (37-109).  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 Valvular heart disease    a. 05/2019 Echo: EF 60-65%. DD. RVSP 41.86mmHg. Mod dil LA. Mod MR/TR. Mild AI.    There were no vitals taken for this visit.  No problem-specific Assessment & Plan notes found for this encounter.   Tommy Medal PharmD CPP  [facility] [address] [place], [place] [address] [phone]

## 2021-01-23 LAB — HAPTOGLOBIN: Haptoglobin: 139 mg/dL (ref 42–346)

## 2021-01-25 LAB — MULTIPLE MYELOMA PANEL, SERUM
Albumin SerPl Elph-Mcnc: 4 g/dL (ref 2.9–4.4)
Albumin/Glob SerPl: 1.3 (ref 0.7–1.7)
Alpha 1: 0.2 g/dL (ref 0.0–0.4)
Alpha2 Glob SerPl Elph-Mcnc: 0.7 g/dL (ref 0.4–1.0)
B-Globulin SerPl Elph-Mcnc: 0.9 g/dL (ref 0.7–1.3)
Gamma Glob SerPl Elph-Mcnc: 1.4 g/dL (ref 0.4–1.8)
Globulin, Total: 3.2 g/dL (ref 2.2–3.9)
IgA: 261 mg/dL (ref 64–422)
IgG (Immunoglobin G), Serum: 1348 mg/dL (ref 586–1602)
IgM (Immunoglobulin M), Srm: 126 mg/dL (ref 26–217)
Total Protein ELP: 7.2 g/dL (ref 6.0–8.5)

## 2021-01-25 LAB — KAPPA/LAMBDA LIGHT CHAINS
Kappa free light chain: 35.7 mg/L — ABNORMAL HIGH (ref 3.3–19.4)
Kappa, lambda light chain ratio: 1.46 (ref 0.26–1.65)
Lambda free light chains: 24.5 mg/L (ref 5.7–26.3)

## 2021-01-26 ENCOUNTER — Inpatient Hospital Stay (HOSPITAL_BASED_OUTPATIENT_CLINIC_OR_DEPARTMENT_OTHER): Payer: Medicare Other | Admitting: Oncology

## 2021-01-26 ENCOUNTER — Encounter: Payer: Self-pay | Admitting: Oncology

## 2021-01-26 VITALS — BP 144/65 | HR 62 | Temp 97.7°F | Resp 16 | Ht 63.0 in | Wt 146.4 lb

## 2021-01-26 DIAGNOSIS — D649 Anemia, unspecified: Secondary | ICD-10-CM

## 2021-01-26 DIAGNOSIS — Z803 Family history of malignant neoplasm of breast: Secondary | ICD-10-CM | POA: Diagnosis not present

## 2021-01-26 DIAGNOSIS — R197 Diarrhea, unspecified: Secondary | ICD-10-CM | POA: Diagnosis not present

## 2021-01-26 DIAGNOSIS — I480 Paroxysmal atrial fibrillation: Secondary | ICD-10-CM | POA: Diagnosis not present

## 2021-01-26 DIAGNOSIS — I2510 Atherosclerotic heart disease of native coronary artery without angina pectoris: Secondary | ICD-10-CM | POA: Diagnosis not present

## 2021-01-26 DIAGNOSIS — N183 Chronic kidney disease, stage 3 unspecified: Secondary | ICD-10-CM | POA: Diagnosis not present

## 2021-01-26 DIAGNOSIS — Z800 Family history of malignant neoplasm of digestive organs: Secondary | ICD-10-CM | POA: Diagnosis not present

## 2021-01-26 DIAGNOSIS — I129 Hypertensive chronic kidney disease with stage 1 through stage 4 chronic kidney disease, or unspecified chronic kidney disease: Secondary | ICD-10-CM | POA: Diagnosis not present

## 2021-01-26 NOTE — Progress Notes (Signed)
From anemia standpoint pt doing well, she states that she has been having loose stools for about 2 weeks. Maybe 2 a day and it has loose form and not watery diarrhea. She eats good, drinks good. no pain.

## 2021-01-27 NOTE — Progress Notes (Signed)
Hematology/Oncology Consult note [facility]  Telephone:([phone] [phone]  Patient Care Team: [name], MD as PCP - General [name], MD as PCP - Cardiology (Cardiology) Birder Robson, MD as Referring Physician (Ophthalmology)   Name of the patient: [name]  [id number]  [birth year]   Date of visit: 01/27/21  Diagnosis-normocytic anemia likely multifactorial secondary to chronic disease and anemia of chronic kidney disease  Chief complaint/ Reason for visit-discuss results of blood work  Heme/Onc history: patient is a 80 year old female with a past medical history significant for atrial fibrillation, paroxysmal supraventricular tachycardia, hypertension, coronary artery disease referred for anemia.  Her most recent CBC with differential from 11/11/2020 showed a white count of 5.4, H&H of 10.6/32.1 with an MCV of 92 and a platelet count of 183.  Looking back at her prior CBCs patient has always had a normal white count and a platelet count and her hemoglobin has typically remained between 10-11 since 2009.  Patient recently had iron studies, TSH and B12 checked on 11/11/2020 which was within normal limits.    Interval history-patient reports having some loose stools over the last 2 weeks.  About 1-2 times a day.  Denies any blood in stools or fever.  Denies other complaints at this time  ECOG PS- 1 Pain scale- 0   Review of systems- Review of Systems  Constitutional: Negative for chills, fever, malaise/fatigue and weight loss.  HENT: Negative for congestion, ear discharge and nosebleeds.   Eyes: Negative for blurred vision.  Respiratory: Negative for cough, hemoptysis, sputum production, shortness of breath and wheezing.   Cardiovascular: Negative for chest pain, palpitations, orthopnea and claudication.  Gastrointestinal: Positive for diarrhea. Negative for abdominal pain, blood in stool, constipation, heartburn, melena,  nausea and vomiting.  Genitourinary: Negative for dysuria, flank pain, frequency, hematuria and urgency.  Musculoskeletal: Negative for back pain, joint pain and myalgias.  Skin: Negative for rash.  Neurological: Negative for dizziness, tingling, focal weakness, seizures, weakness and headaches.  Endo/Heme/Allergies: Does not bruise/bleed easily.  Psychiatric/Behavioral: Negative for depression and suicidal ideas. The patient does not have insomnia.        Allergies  Allergen Reactions  . Amlodipine     Ankle swelling  . Celecoxib Other (See Comments)    Tachycardia/palpitations  . Tramadol Nausea Only     Past Medical History:  Diagnosis Date  . Anemia   . Coronary artery disease, non-occlusive    a. cath 2/09: no CAD, EF 70%  . HYPERLIPIDEMIA   . HYPERTENSION   . Mild Aortic insufficiency   . Moderate mitral regurgitation   . Moderate tricuspid regurgitation   . OSTEOPENIA   . PAF (paroxysmal atrial fibrillation) ([place])    a. s/p ablation 2013; b. CHADS2VASc -> 4 (HTN, age x 2, female)-->Eliquis.  Marland Kitchen PAT (paroxysmal atrial tachycardia) ([place])    a. 04/2019 Zio: Occas PACs and rare PVCs. 21 atrial runs - longest 20 beats, max rate 169.  Marland Kitchen Pneumonia 2009  . RA (rheumatoid arthritis) ([place])   . Sinus Bradycardia    a. asymptomatic but prevents use of AVN blocking agents; b. 04/2019 Zio: Avg HR 61 (37-109).  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 Valvular heart disease    a. 05/2019 Echo: EF 60-65%. DD. RVSP 41.39mmHg. Mod dil LA. Mod MR/TR. Mild AI.     Past Surgical History:  Procedure Laterality Date  . ABLATION OF DYSRHYTHMIC FOCUS    . CARDIAC CATHETERIZATION    .  COLONOSCOPY  04/18/08  . Partial hysterectomy--1979    . Thoracentesis   12/20/07      Social History   Socioeconomic History  . Marital status: Widowed    Spouse name: Not on file  . Number of children: Not on file  . Years of education: Not on file  . Highest education level: Not on file  Occupational  History  . Not on file  Tobacco Use  . Smoking status: Never Smoker  . Smokeless tobacco: Never Used  Vaping Use  . Vaping Use: Never used  Substance and Sexual Activity  . Alcohol use: No    Alcohol/week: 0.0 standard drinks  . Drug use: No  . Sexual activity: Never  Other Topics Concern  . 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 Full Code ( reviewed 2015)       Occasional exercise.   Diet: fruits and veggies, lean meats.   Social Determinants of Health   Financial Resource Strain: Not on file  Food Insecurity: Not on file  Transportation Needs: Not on file  Physical Activity: Not on file  Stress: Not on file  Social Connections: Not on file  Intimate Partner Violence: Not on file    Family History  Problem Relation Age of Onset  . Diabetes Other   . Breast cancer Paternal Aunt 28  . Heart disease Mother   . Pancreatic cancer Father   . Atrial fibrillation Brother   . Heart disease Brother   . Heart attack Brother      Current Outpatient Medications:  .  B Complex Vitamins (VITAMIN B COMPLEX) TABS, Take 1 tablet by mouth daily. , Disp: , Rfl:  .  chlorthalidone (HYGROTON) 25 MG tablet, Take 1 tablet (25 mg total) by mouth daily., Disp: 90 tablet, Rfl: 3 .  cholecalciferol (VITAMIN D3) 25 MCG (1000 UNIT) tablet, Take 1,000 Units by mouth daily., Disp: , Rfl:  .  ELIQUIS 5 MG TABS tablet, TAKE 1 TABLET BY MOUTH TWICE DAILY, Disp: 180 tablet, Rfl: 1 .  hydrALAZINE (APRESOLINE) 50 MG tablet, Take 1 tablet (50 mg total) by mouth 3 (three) times daily., Disp: 90  tablet, Rfl: 2 .  losartan (COZAAR) 100 MG tablet, TAKE 1 TABLET BY MOUTH ONCE DAILY, Disp: 90 tablet, Rfl: 1 .  LUMIGAN 0.01 % SOLN, Place into both eyes 2 (two) times daily., Disp: , Rfl:  .  rosuvastatin (CRESTOR) 10 MG tablet, Take 1 tablet (10 mg total) by mouth daily., Disp: 90 tablet, Rfl: 3  Physical exam:  Vitals:   01/26/21 1033  BP: (!) 144/65  Pulse: 62  Resp: 16  Temp: 97.7 F (36.5 C)  TempSrc: Oral  Weight: 146 lb 6.4 oz (66.4 kg)  Height: 5\' 3"  (1.6 m)   Physical Exam Constitutional:      General: She is not in acute distress. Cardiovascular:     Rate and Rhythm: Normal rate and regular rhythm.     Heart sounds: Normal heart sounds.  Pulmonary:     Effort: Pulmonary effort is normal.  Skin:    General: Skin is warm and dry.  Neurological:  Mental Status: She is alert and oriented to person, place, and time.      CMP Latest Ref Rng & Units 01/22/2021  Glucose 70 - 99 mg/dL 120(H)  BUN 8 - 23 mg/dL 35(H)  Creatinine 0.44 - 1.00 mg/dL 1.52(H)  Sodium 135 - 145 mmol/L 137  Potassium 3.5 - 5.1 mmol/L 3.9  Chloride 98 - 111 mmol/L 106  CO2 22 - 32 mmol/L 23  Calcium 8.9 - 10.3 mg/dL 9.4  Total Protein 6.5 - 8.1 g/dL 7.7  Total Bilirubin 0.3 - 1.2 mg/dL 0.6  Alkaline Phos 38 - 126 U/L 49  AST 15 - 41 U/L 23  ALT 0 - 44 U/L 14   CBC Latest Ref Rng & Units 01/22/2021  WBC 4.0 - 10.5 K/uL 5.5  Hemoglobin 12.0 - 15.0 g/dL 10.3(L)  Hematocrit 36.0 - 46.0 % 30.9(L)  Platelets 150 - 400 K/uL 178    No images are attached to the encounter.  DG Shoulder Right  Result Date: 01/14/2021 CLINICAL DATA:  Loss of motion. EXAM: RIGHT SHOULDER - 2+ VIEW COMPARISON:  Chest x-ray 03/18/2020. FINDINGS: No evidence of fracture, dislocation, or separation. Mild acromioclavicular glenohumeral degenerative change. Subchondral cyst in the humeral head, most likely degenerative. IMPRESSION: 1.  Mild acromioclavicular glenohumeral degenerative change. 2.  No acute abnormality  identified. Electronically Signed   By: Marcello Moores  Register   On: 01/14/2021 14:30     Assessment and plan- Patient is a 80 y.o. female referred for normocytic anemia likely multifactorial secondary to anemia of chronic disease and chronic kidney disease  Discussed the results of blood work with the patient which shows that her H&H is presently 10.3/30.9 which is close to her baseline.  Myeloma panel was unremarkable and serum free light chain ratio was normal.  LDH and reticulocyte count normal.  Haptoglobin normal.  Ferritin and iron studies were normal.  B1 and B6 levels are pending.  I suspect that her anemia is secondary to chronic kidney disease.  Given that her hemoglobin is presently between 10-11 there would be no indication to start EPO at this time but it can be considered if her hemoglobin drifts down to less than 10 subsequently.  CBC ferritin and iron studies and B12 and CMP in 3 months followed by in person or video visit  Diarrhea: About 1-2 episodes a day.  She will be following up with Dr. Diona Browner for this   Visit Diagnosis 1. Normocytic anemia      Dr. Randa Evens, MD, MPH [facility] [phone] 01/27/2021 9:37 AM

## 2021-01-28 LAB — VITAMIN B6: Vitamin B6: 39.5 ug/L (ref 3.4–65.2)

## 2021-02-01 LAB — VITAMIN B1: Vitamin B1 (Thiamine): 110.9 nmol/L (ref 66.5–200.0)

## 2021-02-17 ENCOUNTER — Other Ambulatory Visit: Payer: Self-pay | Admitting: Internal Medicine

## 2021-02-18 NOTE — Telephone Encounter (Signed)
Prescription refill request for Eliquis received.  Indication: afib  Last office visit: Oval Linsey, 11/27/2020 Scr: 1.52, 01/22/2021 Age: 80 yo  Weight: 66.4 kg   Pt is on the correct dose of Eliquis per dosing criteria, prescription refill sent for Eliquis 5mg  BID.

## 2021-02-18 NOTE — Telephone Encounter (Signed)
Refill request

## 2021-03-23 ENCOUNTER — Other Ambulatory Visit: Payer: Self-pay

## 2021-03-23 ENCOUNTER — Encounter: Payer: Self-pay | Admitting: Cardiovascular Disease

## 2021-03-23 ENCOUNTER — Ambulatory Visit: Payer: Medicare Other | Admitting: Cardiovascular Disease

## 2021-03-23 DIAGNOSIS — I358 Other nonrheumatic aortic valve disorders: Secondary | ICD-10-CM

## 2021-03-23 DIAGNOSIS — I10 Essential (primary) hypertension: Secondary | ICD-10-CM

## 2021-03-23 DIAGNOSIS — Z9889 Other specified postprocedural states: Secondary | ICD-10-CM | POA: Diagnosis not present

## 2021-03-23 DIAGNOSIS — Z8679 Personal history of other diseases of the circulatory system: Secondary | ICD-10-CM

## 2021-03-23 DIAGNOSIS — E782 Mixed hyperlipidemia: Secondary | ICD-10-CM | POA: Diagnosis not present

## 2021-03-23 NOTE — Assessment & Plan Note (Signed)
She remains in sinus rhythm.  Continue Eliquis.

## 2021-03-23 NOTE — Assessment & Plan Note (Signed)
Mean gradient 8 mmHg on echo in 2020.  She remains asymptomatic.

## 2021-03-23 NOTE — Assessment & Plan Note (Signed)
Blood pressure is well-controlled on chlorthalidone, hydralazine, and losartan.

## 2021-03-23 NOTE — Progress Notes (Signed)
Cardiology Office Note:    Date:  03/23/2021   ID:  [name], DOB [birth year], MRN [id number]  PCP:  [name], MD  Cardiologist:  [name], MD  Nephrologist:  Referring MD: [name], MD   CC: Hypertension  History of Present Illness:    [name] is a 80 y.o. female with a hx of persistent atrial fibrillation status post ablation, bradycardia, CKD 2, mitral regurgitation, aortic regurgitation, hypertension, and hyperlipidemia here for follow up. She had an echo 05/14/2019 that revealed LVEF 60 to 65% with grade 2 diastolic dysfunction and mildly elevated pulmonary pressures.  She was initially seen 12/2020 to establish care in the hypertension clinic.  She was first diagnosed with hypertension many decades ago.  In the past it was well-controlled.  However in the last year she has struggled.  Lately her blood pressure has been in the 160s at home.  She last saw [name] on 11/22/2019.  Prior to that losartan was increased to 50 mg.  Her blood pressure remained above goal since making that change.  [name] increased losartan to 100mg .  She followed up with [name], NP, and hydralazine 25mg  bid was added on 2/26.  At her first visit hydralazine was increased.  She saw [name], NP on 12/2019 an dit was increased to 75mg .  She started to work out at the [employer].  She was seen in the ED with palpitations 03/2020.  She was in sinus rhythm at the time.  This has resolved.   It was noted that she had inadvertently stopped taking her hydrochlorothiazide.  She this was resumed when she saw her pharmacist on 03/2020.  Hydralazine was reduced back to 50 mg twice daily.  Since then her BP has been well-controlled.  She has been struggling with pain in her leg.  When the pain is there her blood pressure is higher.  She injured it at the exercise program at [facility] which she really likes.  She is doing some physical therapy and this seems to be helping.  She has no  exertional chest pain and her breathing is stable.  She denies lower extremity edema, orthopnea, or PND.  At her last appointment her lipids were poorly controlled.  However she wanted to work on diet and exercise prior to starting medication. Her BP was above goal, so HCTZ was switched to chorthalidone. Today, she is accompanied by her daughter. Overall she is feeling good, and has no new complaints at this time. For exercise she is mostly walking whenever she is able. She also stays active with gardening, and has no exertional symptoms. However, she has some minor hip pain she attributes to working in the garden. At home her blood pressure averages 130/60. In her regimen, she continues to tolerate rosuvastatin. She denies any chest pain, shortness of breath, palpitations, or exertional symptoms. No headaches, lightheadedness, or syncope to report. Also has no lower extremity edema, orthopnea or PND.   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 cath 2/09: no CAD, EF 70%   HYPERLIPIDEMIA    HYPERTENSION    Mild Aortic insufficiency    Moderate mitral regurgitation    Moderate tricuspid regurgitation  OSTEOPENIA    PAF (paroxysmal atrial fibrillation) ([place])    a. s/p ablation 2013; b. CHADS2VASc -> 4 (HTN, age x 2, female)-->Eliquis.   PAT (paroxysmal atrial tachycardia) ([place])    a. 04/2019 Zio: Occas PACs and rare PVCs. 21 atrial runs - longest 20 beats, max rate 169.   Pneumonia 2009   RA (rheumatoid arthritis) ([facility])    Sinus Bradycardia    a. asymptomatic but prevents use of AVN blocking agents; b. 04/2019 Zio: Avg HR 61 (37-109).   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 05/2019 Echo: EF 60-65%.  DD. RVSP 41.86mmHg. Mod dil LA. Mod MR/TR. Mild AI.    Past Surgical History:  Procedure Laterality Date   ABLATION OF DYSRHYTHMIC FOCUS     CARDIAC CATHETERIZATION     COLONOSCOPY  04/18/08   Partial hysterectomy--1979     Thoracentesis   12/20/07      Current Medications: Current Meds  Medication Sig   B Complex Vitamins (VITAMIN B COMPLEX) TABS Take 1 tablet by mouth daily.    chlorthalidone (HYGROTON) 25 MG tablet Take 1 tablet (25 mg total) by mouth daily.   cholecalciferol (VITAMIN D3) 25 MCG (1000 UNIT) tablet Take 1,000 Units by mouth daily.   ELIQUIS 5 MG TABS tablet TAKE 1 TABLET BY MOUTH TWICE DAILY   hydrALAZINE (APRESOLINE) 50 MG tablet Take 1 tablet (50 mg total) by mouth 3 (three) times daily.   losartan (COZAAR) 100 MG tablet TAKE 1 TABLET BY MOUTH ONCE DAILY   LUMIGAN 0.01 % SOLN Place into both eyes 2 (two) times daily.   rosuvastatin (CRESTOR) 10 MG tablet Take 1 tablet (10 mg total) by mouth daily.     Allergies:   Amlodipine, Celecoxib, and Tramadol   Social History   Socioeconomic History   Marital status: Widowed    Spouse name: Not on file   Number of children: Not on file   Years of education: Not on file   Highest education level: Not on file  Occupational History   Not on file  Tobacco Use   Smoking status: Never   Smokeless tobacco: Never  Vaping Use   Vaping Use: Never used  Substance and Sexual Activity   Alcohol use: No    Alcohol/week: 0.0 standard drinks   Drug use: No   Sexual activity: Never  Other Topics Concern   Not on file  Social History Narrative   Marital Status: widow x 1 yr   Children: 63, grandchildren 39, numerous great grand children   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 Full Code ( reviewed 2015)       Occasional exercise.   Diet: fruits and veggies, lean meats.   Social Determinants of Health   Financial Resource Strain: Not on file  Food Insecurity: Not on file  Transportation Needs: Not on file  Physical Activity: Not on file  Stress: Not on file  Social Connections: Not on file     Family History: The patient's family history includes Atrial fibrillation in her brother; Breast cancer (age of onset: 33) in her paternal aunt; Diabetes in an other family member; Heart attack in her brother; Heart disease in her brother and mother; Pancreatic cancer in her father.  ROS:  Please see the history of present illness.    (+) Minor hip pain All other systems reviewed and are negative.  EKGs/Labs/Other Studies Reviewed:    EKG:     03/23/2021: EKG is not ordered today. 11/27/2020: Sinus rhythm.  Rate 60 bpm. 12/06/19: sinus bradycardia.  Rate 53 bpm.  Non-specific ST-T changes.   Echo 05/14/19:  1. The left ventricle has normal systolic function with an ejection  fraction of 60-65%. The cavity size was normal. There is mildly increased  left ventricular wall thickness. Left ventricular diastolic Doppler  parameters are consistent with  pseudonormalization.   2. The right ventricle has normal systolic function. The cavity was  normal. There is no increase in right ventricular wall thickness. Right  ventricular systolic pressure is mildly elevated with an estimated  pressure of 41.7 mmHg.   3. Left atrial size was moderately dilated.   4. Incompletely evaluated hypoechoic structure noted in the liver, most  likely a cyst.   5. The mitral valve is degenerative. Mild thickening of the mitral valve  leaflet. Mitral valve regurgitation is moderate by color flow Doppler.   6. Tricuspid valve regurgitation is moderate.   7. The aortic valve has an indeterminate number of cusps. Mild thickening  of the aortic valve. Mild  calcification of the aortic valve. Aortic valve  regurgitation is mild by color flow Doppler.   8. The aorta is normal in size and structure.   Recent Labs: 11/11/2020: TSH 1.95 01/22/2021: ALT 14; BUN 35; Creatinine, Ser 1.52; Hemoglobin 10.3; Platelets 178; Potassium 3.9; Sodium 137   Recent Lipid Panel    Component Value Date/Time   CHOL 233 (H) 12/22/2020 1345   TRIG 47 12/22/2020 1345   HDL 90 12/22/2020 1345   CHOLHDL 2.6 12/22/2020 1345   VLDL 9 12/22/2020 1345   LDLCALC 134 (H) 12/22/2020 1345   LDLDIRECT 104.4 11/26/2012 0746    Physical Exam:    VS:  BP (!) 122/56 (BP Location: Left Arm, Patient Position: Sitting, Cuff Size: Normal)   Pulse 67   Ht 5\' 3"  (1.6 m)   BMI 25.93 kg/m  , BMI Body mass index is 25.93 kg/m. GENERAL:  Well appearing HEENT: Pupils equal round and reactive, fundi not visualized, oral mucosa unremarkable NECK:  No jugular venous distention, waveform within normal limits, carotid upstroke brisk and symmetric, no bruits LUNGS:  Clear to auscultation bilaterally HEART:  RRR.  PMI not displaced or sustained,S1 and S2 within normal limits, no S3, no S4, no clicks, no rubs, III/VI systolic murmurs ABD:  Flat, positive bowel sounds normal in frequency in pitch, no bruits, no rebound, no guarding, no midline pulsatile mass, no hepatomegaly, no splenomegaly EXT:  2 plus pulses throughout, no edema, no cyanosis no clubbing SKIN:  No rashes no nodules NEURO:  Cranial nerves II through XII grossly intact, motor grossly intact throughout PSYCH:  Cognitively intact, oriented to person place and time   ASSESSMENT:    1. Aortic valve sclerosis   2. HYPERLIPIDEMIA   3. Essential hypertension   4. Status post ablation of atrial fibrillation      PLAN:   Aortic valve sclerosis Mean gradient 8 mmHg on echo in 2020.  She remains asymptomatic.  HYPERLIPIDEMIA She was started on rosuvastatin 01/2021.  Repeat lipids and a CMP in 1 month.  Essential  hypertension Blood pressure is well-controlled on chlorthalidone, hydralazine, and losartan.  Status post ablation of atrial fibrillation She remains in sinus rhythm.  Continue Eliquis.  Disposition:    FU with MD/PharmD in 6 months.     Medication Adjustments/Labs and Tests Ordered: Current medicines are reviewed at length with the patient today.  Concerns regarding medicines are outlined above.  No orders of the defined types were placed in this encounter.  No orders of the defined types were placed in this encounter.  I,Mathew Stumpf,acting as a Education administrator for [name], MD.,have documented all relevant documentation on the behalf of [name], MD,as directed by  [name], MD while in the presence of [name], MD.  I, [place] Oval Linsey, MD have reviewed all documentation for this visit.  The documentation of the exam, diagnosis, procedures, and orders on 03/23/2021 are all accurate and complete.   Signed, [name], MD  03/23/2021 5:46 PM    [facility]

## 2021-03-23 NOTE — Assessment & Plan Note (Signed)
She was started on rosuvastatin 01/2021.  Repeat lipids and a CMP in 1 month.

## 2021-03-23 NOTE — Patient Instructions (Signed)
Medication Instructions:  Your physician recommends that you continue on your current medications as directed. Please refer to the Current Medication list given to you today.   *If you need a refill on your cardiac medications before your next appointment, please call your pharmacy*  Lab Work: FASTING LP/CMET IN ABOUT 1 MONTH  If you have labs (blood work) drawn today and your tests are completely normal, you will receive your results only by: [place] (if you have MyChart) OR A paper copy in the mail If you have any lab test that is abnormal or we need to change your treatment, we will call you to review the results.  Testing/Procedures: NONE  Follow-Up: At [facility], you and your health needs are our priority.  As part of our continuing mission to provide you with exceptional heart care, we have created designated Provider Care Teams.  These Care Teams include your primary Cardiologist (physician) and Advanced Practice Providers (APPs -  Physician Assistants and Nurse Practitioners) who all work together to provide you with the care you need, when you need it.  We recommend signing up for the patient portal called "MyChart".  Sign up information is provided on this After Visit Summary.  MyChart is used to connect with patients for Virtual Visits (Telemedicine).  Patients are able to view lab/test results, encounter notes, upcoming appointments, etc.  Non-urgent messages can be sent to your provider as well.   To learn more about what you can do with MyChart, go to [email or web address].    Your next appointment:   6 month(s)  The format for your next appointment:   In Person  Provider:   DR Grafton

## 2021-03-29 ENCOUNTER — Encounter: Payer: Self-pay | Admitting: Family Medicine

## 2021-03-29 ENCOUNTER — Ambulatory Visit (INDEPENDENT_AMBULATORY_CARE_PROVIDER_SITE_OTHER): Payer: Medicare Other | Admitting: Family Medicine

## 2021-03-29 ENCOUNTER — Other Ambulatory Visit: Payer: Self-pay

## 2021-03-29 VITALS — BP 130/70 | HR 63 | Temp 98.1°F | Ht 63.0 in | Wt 140.5 lb

## 2021-03-29 DIAGNOSIS — M545 Low back pain, unspecified: Secondary | ICD-10-CM | POA: Diagnosis not present

## 2021-03-29 DIAGNOSIS — R29898 Other symptoms and signs involving the musculoskeletal system: Secondary | ICD-10-CM | POA: Diagnosis not present

## 2021-03-29 MED ORDER — PREDNISONE 20 MG PO TABS: ORAL_TABLET | ORAL | 0 refills | Status: DC

## 2021-03-29 NOTE — Progress Notes (Signed)
Jacqueline Yurchak T. Demetric [name], MD, [name] at [facility] [place], [address]  Phone: [phone]  FAX: [phone]  [name] - 80 y.o. female  MRN [id number]  Date of Birth: [birth year]  Date: 03/29/2021  PCP: [name], MD  Referral: [name], MD  Chief Complaint  Patient presents with   Hip Pain    Left    This visit occurred during the SARS-CoV-2 public health emergency.  Safety protocols were in place, including screening questions prior to the visit, additional usage of staff PPE, and extensive cleaning of exam room while observing appropriate contact time as indicated for disinfecting solutions.   Subjective:   [name] is a 80 y.o. very pleasant female patient with Body mass index is 24.89 kg/m. 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  This is been ongoing off and on for years, and I have seen her multiple times for this and other issues and very recently she had some very significant weakness of her hip.  She has what she describes as left hip pain.  There is no groin pain.  She does not have any lateral hip pain.  She points to the region of her left posterior pelvis, and some pain in the lowest region of her back and at the SI joints.  No injury at all.   Basic movements have been somewhat difficult, and she and her husband relate that she has not been able to get up and move all that well in the last week.  Review of Systems is noted in the HPI, as appropriate   Objective:   BP 130/70   Pulse 63   Temp 98.1 F (36.7 C) (Temporal)   Ht 5\' 3"  (1.6 m)   Wt 140 lb 8 oz (63.7 kg)   SpO2 96%   BMI 24.89 kg/m    Range of motion at  the waist: Flexion, extension, lateral bending and rotation: Forward flexion is limited to 50 degrees.  Extension,  lateral bending, and rotational movements are completely intact.  No echymosis or edema Rises to examination table with mild difficulty Gait: minimally antalgic  Inspection/Deformity: N Paraspinus Tenderness: L5-S1, as well as at the SI joints bilaterally, left greater than right  B Ankle Dorsiflexion (L5,4): 5/5 B Great Toe Dorsiflexion (L5,4): 5/5 Heel Walk (L5): WNL Toe Walk (S1): WNL Rise/Squat (L4): WNL, mild pain  SENSORY B Medial Foot (L4): WNL B Dorsum (L5): WNL B Lateral (S1): WNL Light Touch: WNL Pinprick: WNL  REFLEXES Knee (L4): 2+ Ankle (S1): 2+  B SLR, seated: neg B SLR, supine: neg B FABER: Provokes some pain on the left B Reverse FABER: Some pain on the left B Greater Troch: NT B Log Roll: neg B Sciatic Notch: Bilateral pain   HIP EXAM: SIDE: Bilateral ROM: Abduction, Flexion, Internal and External range of motion: Full Pain with terminal IROM and EROM: None Piriformis: NT at direct palpation Str: flexion: 3+/5 abduction: 4/5 adduction: 4/5 Strength testing non-tender    Radiology: No results found.  Assessment and Plan:     ICD-10-CM   1. Low back pain of over 3 months duration  M54.50     2. Weakness of left hip  R29.898      Back pain, acute on chronic with exacerbation.  Hip pain is consistent with referred back pain from lowest part of her back as well as pain in the posterior pelvis, referred pain.  She also has some significant weakness in flexion greater than abduction.  This is been an intermittent problem with the patient over the past few years when I have seen her.  I gave her a very basic range of motion and a few strengthening exercises to work on.  I asked her to keep moving and not sit down or lie down all the time.  Meds ordered this encounter  Medications   predniSONE (DELTASONE) 20 MG tablet    Sig: 2 tabs po daily for 5 days, then 1 tab po daily for 5 days    Dispense:  15 tablet    Refill:  0      Signed,  [name] [name], MD   Outpatient Encounter Medications as of 03/29/2021  Medication Sig   B Complex Vitamins (VITAMIN B COMPLEX) TABS Take 1 tablet by mouth daily.    chlorthalidone (HYGROTON) 25 MG tablet Take 1 tablet (25 mg total) by mouth daily.   cholecalciferol (VITAMIN D3) 25 MCG (1000 UNIT) tablet Take 1,000 Units by mouth daily.   ELIQUIS 5 MG TABS tablet TAKE 1 TABLET BY MOUTH TWICE DAILY   hydrALAZINE (APRESOLINE) 50 MG tablet Take 1 tablet (50 mg total) by mouth 3 (three) times daily.   losartan (COZAAR) 100 MG tablet TAKE 1 TABLET BY MOUTH ONCE DAILY   LUMIGAN 0.01 % SOLN Place into both eyes 2 (two) times daily.   predniSONE (DELTASONE) 20 MG tablet 2 tabs po daily for 5 days, then 1 tab po daily for 5 days   rosuvastatin (CRESTOR) 10 MG tablet Take 1 tablet (10 mg total) by mouth daily.   No facility-administered encounter medications on file as of 03/29/2021.

## 2021-04-04 DIAGNOSIS — Z20822 Contact with and (suspected) exposure to covid-19: Secondary | ICD-10-CM | POA: Diagnosis not present

## 2021-04-06 ENCOUNTER — Telehealth: Payer: Self-pay | Admitting: Family Medicine

## 2021-04-06 NOTE — Telephone Encounter (Signed)
Call  Needs to be seen first in my opinion.. put on schedule on Thursday ( day 4 of illness) or go to urgent care if SOB or acute issues.

## 2021-04-06 NOTE — Telephone Encounter (Signed)
[name] notified as instructed by telephone. Virtual Visit schedule 04/08/2021 at 11:00 am with Dr. Diona Browner.

## 2021-04-06 NOTE — Telephone Encounter (Signed)
Patient tested positive for Covid on 04/05/21 Symptoms onset 04/05/21 Pt c/o SOB, runny nose, productive cough. Denies fever/chills  Patient is requesting medication be called in for Covid   Pharmacy: [place], [place]

## 2021-04-08 ENCOUNTER — Telehealth (INDEPENDENT_AMBULATORY_CARE_PROVIDER_SITE_OTHER): Payer: Medicare Other | Admitting: Family Medicine

## 2021-04-08 VITALS — Ht 63.0 in | Wt 140.0 lb

## 2021-04-08 DIAGNOSIS — U071 COVID-19: Secondary | ICD-10-CM | POA: Diagnosis not present

## 2021-04-08 MED ORDER — MOLNUPIRAVIR EUA 200MG CAPSULE: 4.0000 | ORAL_CAPSULE | Freq: Two times a day (BID) | ORAL | 0 refills | Status: AC

## 2021-04-08 NOTE — Assessment & Plan Note (Signed)
COVID19  Infection < 5 days from onset of symptoms in triple vaccinated individual with history of  afibrillation  No clear sign of bacterial infection at this time.   No SOB.  No red flags/need for ER visit or in-person exam at respiratory clinic at this time..    Pt higher risk for COVID complications given  CVD and age Cannot use paxlovid given contraindicaitons with Eliquis.   Will start molnupiravir.  Reviewed course of medication and side effect profile with patient in detail.   Symptomatic care with mucinex and cough suppressant at night. If SOB begins symptoms worsening.. have low threshold for in-person exam, if severe shortness of breath ER visit recommended.  Can monitor Oxygen saturation at home with home monitor if able to obtain.  Go to ER if O2 sat < 90% on room air.   Reviewed home care and provided information through [facility].  Recommended quarantine 5 days isolation recommended. Return to work day 6 and wear mask for 4 more days to complete 10 days. Provided info about prevention of spread of COVID 19.

## 2021-04-08 NOTE — Progress Notes (Signed)
VIRTUAL VISIT Due to national recommendations of social distancing due to [place] 19, a virtual visit is felt to be most appropriate for this patient at this time.   I connected with the patient on 04/08/21 at 11:00 AM EDT by virtual telehealth platform and verified that I am speaking with the correct person using two identifiers.   I discussed the limitations, risks, security and privacy concerns of performing an evaluation and management service by  virtual telehealth platform and the availability of in person appointments. I also discussed with the patient that there may be a patient responsible charge related to this service. The patient expressed understanding and agreed to proceed.  Patient location: Home Provider Location: [facility] Participants: Eliezer Lofts and Rosalyn Gess   Chief Complaint  Patient presents with   Covid Positive    Sinus symptoms Tested positive 04/06/21    History of Present Illness: 80 year old female patient with history of Atrial fibrillation on eliquis presents with COVID infection on day 4  No  chronic pulmonary disease.   She reports  mild nasal congestion, headache.   No fever, no SOB, no myalgia. No ear pain, no  ST.   Paxlovid contraindicated given on Eliquis.  COVID 19 screening Symptoms started 6/27 COVID testing: [facility] PCR 6/28 COVID vaccine: 09/25/2020 , 11/25/2019 , 11/04/2019 COVID exposure: No recent travel .Marland Kitchen exposed to huisband with COVID last week. The importance of social distancing was discussed today.    Review of Systems  Constitutional:  Negative for chills and fever.  HENT:  Negative for congestion and ear pain.   Eyes:  Negative for pain and redness.  Respiratory:  Negative for cough and shortness of breath.   Cardiovascular:  Negative for chest pain, palpitations and leg swelling.  Gastrointestinal:  Negative for abdominal pain, blood in stool, constipation, diarrhea, nausea and vomiting.  Genitourinary:   Negative for dysuria.  Musculoskeletal:  Negative for falls and myalgias.  Skin:  Negative for rash.  Neurological:  Negative for dizziness.  Psychiatric/Behavioral:  Negative for depression. The patient is not nervous/anxious.      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 cath 2/09: no CAD, EF 70%   HYPERLIPIDEMIA    HYPERTENSION    Mild Aortic insufficiency    Moderate mitral regurgitation    Moderate tricuspid regurgitation    OSTEOPENIA    PAF (paroxysmal atrial fibrillation) ([place])    a. s/p ablation 2013; b. CHADS2VASc -> 4 (HTN, age x 2, female)-->Eliquis.   PAT (paroxysmal atrial tachycardia) ([name])    a. 04/2019 Zio: Occas PACs and rare PVCs. 21 atrial runs - longest 20 beats, max rate 169.   Pneumonia 2009   RA (rheumatoid arthritis) ([facility])    Sinus Bradycardia    a. asymptomatic but prevents use of AVN blocking agents; b. 04/2019 Zio: Avg HR 61 (37-109).   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 05/2019 Echo: EF 60-65%. DD. RVSP 41.20mmHg. Mod dil LA. Mod MR/TR. Mild AI.    reports that she has never smoked. She has never used smokeless tobacco. She reports that she does not drink alcohol and does not use drugs.   Current Outpatient Medications:    B Complex Vitamins (VITAMIN B COMPLEX) TABS, Take 1 tablet by mouth daily. ,  Disp: , Rfl:    chlorthalidone (HYGROTON) 25 MG tablet, Take 1 tablet (25 mg total) by mouth daily., Disp: 90 tablet, Rfl: 3   cholecalciferol (VITAMIN D3) 25 MCG (1000 UNIT) tablet, Take 1,000 Units by mouth daily., Disp: , Rfl:    ELIQUIS 5 MG TABS tablet, TAKE 1 TABLET BY MOUTH TWICE DAILY, Disp: 180 tablet, Rfl: 1   hydrALAZINE (APRESOLINE) 50 MG tablet, Take 1 tablet (50 mg total) by mouth 3 (three) times daily., Disp: 90 tablet, Rfl: 2   losartan (COZAAR) 100 MG tablet, TAKE 1 TABLET BY  MOUTH ONCE DAILY, Disp: 90 tablet, Rfl: 1   LUMIGAN 0.01 % SOLN, Place into both eyes 2 (two) times daily., Disp: , Rfl:    predniSONE (DELTASONE) 20 MG tablet, 2 tabs po daily for 5 days, then 1 tab po daily for 5 days (Patient not taking: Reported on 04/08/2021), Disp: 15 tablet, Rfl: 0   rosuvastatin (CRESTOR) 10 MG tablet, Take 1 tablet (10 mg total) by mouth daily., Disp: 90 tablet, Rfl: 3   Observations/Objective: Height 5\' 3"  (1.6 m), weight 140 lb (63.5 kg).  Physical Exam  Physical Exam Constitutional:      General: The patient is not in acute distress. Pulmonary:     Effort: Pulmonary effort is normal. No respiratory distress.  Neurological:     Mental Status: The patient is alert and oriented to person, place, and time.  Psychiatric:        Mood and Affect: Mood normal.        Behavior: Behavior normal.   Assessment and Plan Problem List Items Addressed This Visit     COVID-19 virus infection - Primary    COVID19  Infection < 5 days from onset of symptoms in triple vaccinated individual with history of  afibrillation  No clear sign of bacterial infection at this time.   No SOB.  No red flags/need for ER visit or in-person exam at respiratory clinic at this time..    Pt higher risk for COVID complications given  CVD and age Cannot use paxlovid given contraindicaitons with Eliquis.   Will start molnupiravir.  Reviewed course of medication and side effect profile with patient in detail.   Symptomatic care with mucinex and cough suppressant at night. If SOB begins symptoms worsening.. have low threshold for in-person exam, if severe shortness of breath ER visit recommended.  Can monitor Oxygen saturation at home with home monitor if able to obtain.  Go to ER if O2 sat < 90% on room air.   Reviewed home care and provided information through [place].  Recommended quarantine 5 days isolation recommended. Return to work day 6 and wear mask for 4 more days to complete 10  days. Provided info about prevention of spread of COVID 19.        Relevant Medications   molnupiravir EUA 200 mg CAPS      I discussed the assessment and treatment plan with the patient. The patient was provided an opportunity to ask questions and all were answered. The patient agreed with the plan and demonstrated an understanding of the instructions.   The patient was advised to call back or seek an in-person evaluation if the symptoms worsen or if the condition fails to improve as anticipated.     Eliezer Lofts, MD

## 2021-04-10 DIAGNOSIS — I10 Essential (primary) hypertension: Secondary | ICD-10-CM | POA: Diagnosis not present

## 2021-04-10 DIAGNOSIS — Z7901 Long term (current) use of anticoagulants: Secondary | ICD-10-CM | POA: Diagnosis not present

## 2021-04-10 DIAGNOSIS — Z881 Allergy status to other antibiotic agents status: Secondary | ICD-10-CM | POA: Diagnosis not present

## 2021-04-10 DIAGNOSIS — Z885 Allergy status to narcotic agent status: Secondary | ICD-10-CM | POA: Diagnosis not present

## 2021-04-10 DIAGNOSIS — I4891 Unspecified atrial fibrillation: Secondary | ICD-10-CM | POA: Diagnosis not present

## 2021-04-10 DIAGNOSIS — M25552 Pain in left hip: Secondary | ICD-10-CM | POA: Diagnosis not present

## 2021-04-13 ENCOUNTER — Telehealth: Payer: Self-pay | Admitting: Family Medicine

## 2021-04-13 NOTE — Telephone Encounter (Signed)
Pt called in stating she went to emergency room over the weekend and they are wanting her to get an [name]. She is wondering if our office can order it. I advised patient that she may need an office visit, but that I would send the request. Please advise. She doesn't have a preference on location.

## 2021-04-13 NOTE — Telephone Encounter (Signed)
Spoke with [name].  She wants to get [name].  Prefers [employer].

## 2021-04-13 NOTE — Telephone Encounter (Signed)
I am sorry I mistyped.. I meant  she needs OV here with me next week or referral to get [name]. Will likely need 4-6 week of failed treatment documented to have insurance cover [name].

## 2021-04-14 NOTE — Telephone Encounter (Signed)
Ms. Masoner notified as instructed by telephone.  Appointment scheduled with Dr. Diona Browner 04/20/2021 at 11:00 am.

## 2021-04-15 ENCOUNTER — Other Ambulatory Visit (HOSPITAL_BASED_OUTPATIENT_CLINIC_OR_DEPARTMENT_OTHER): Payer: Self-pay | Admitting: Cardiovascular Disease

## 2021-04-20 ENCOUNTER — Encounter: Payer: Self-pay | Admitting: Family Medicine

## 2021-04-20 ENCOUNTER — Ambulatory Visit (INDEPENDENT_AMBULATORY_CARE_PROVIDER_SITE_OTHER): Payer: Medicare Other | Admitting: Family Medicine

## 2021-04-20 ENCOUNTER — Other Ambulatory Visit: Payer: Self-pay

## 2021-04-20 VITALS — BP 140/70 | HR 60 | Temp 98.3°F | Ht 63.0 in | Wt 138.2 lb

## 2021-04-20 DIAGNOSIS — M25552 Pain in left hip: Secondary | ICD-10-CM | POA: Insufficient documentation

## 2021-04-20 MED ORDER — TRAMADOL HCL 50 MG PO TABS: 50.0000 mg | ORAL_TABLET | Freq: Three times a day (TID) | ORAL | 0 refills | Status: DC | PRN

## 2021-04-20 NOTE — Assessment & Plan Note (Signed)
X-ray showed no fracture. The patient has failed 6 weeks of conservative treatment including   Prednisone, Voltaren gel, lidocaine patches, tylenol and muscle relaxant. NSAIDs contraindicated given on Eliquis.  Given keeping her up at night and lack of improvement after 6 weeks... refer for  [name] to eval left hip. No fever. Will likely also need Ortho referral

## 2021-04-20 NOTE — Progress Notes (Signed)
Patient ID: [name], female    DOB: [birth year], 80 y.o.   MRN: [id number]  This visit was conducted in person.  BP 140/70   Pulse 60   Temp 98.3 F (36.8 C) (Temporal)   Ht 5' 3"  (1.6 m)   Wt 138 lb 4 oz (62.7 kg)   SpO2 99%   BMI 24.49 kg/m    CC: Chief Complaint  Patient presents with   Follow-up    ER Visit-Hip Pain    Subjective:   HPI: [name] is a 80 y.o. female presenting on 04/20/2021 for Follow-up (ER Visit-Hip Pain) Seen By Dr. Lorelei Pont 03/29/21 for  left hip pain. OV note  reviewed in detail. Treated with prednisone course.  Seen in ED on 7/2 for  anterior left hip pain. Ongoing for 6  weeks now.  Reviewed note in detail.  Keeping her up at night. No proceeding fall. No change in activity.  No improvement with prednisone course or home PT. Oral NSAIDs contraindicated given on Eliquis.    X-ray: No fracture or malalignment of the left hip. Diffuse demineralization may obscure nondisplaced fractures.  Bilateral hip joints are preserved. Symmetric sacroiliac joints with mild periarticular sclerosis. Mild degenerative changes symphysis pubis with mild offset. Multifocal pelvic and greater trochanteric enthesophytes.      Tylenol does not help.  Treated with lidocaine patch and Voltaren gel in ER. Eased pain slightly but pain is still throbbing.   Today pain  is  gradually worsening over time. Cannot sleep due to pain.   Pain with movement as well as sitting. No low back pain, no radiation of pain into leg.  Pain on scale of 8/10       Relevant past medical, surgical, family and social history reviewed and updated as indicated. Interim medical history since our last visit reviewed. Allergies and medications reviewed and updated. Outpatient Medications Prior to Visit  Medication Sig Dispense Refill   B Complex Vitamins (VITAMIN B COMPLEX) TABS Take 1 tablet by mouth daily.      chlorthalidone (HYGROTON) 25 MG tablet Take 1 tablet (25  mg total) by mouth daily. 90 tablet 3   cholecalciferol (VITAMIN D3) 25 MCG (1000 UNIT) tablet Take 1,000 Units by mouth daily.     diclofenac Sodium (VOLTAREN) 1 % GEL Apply topically.     ELIQUIS 5 MG TABS tablet TAKE 1 TABLET BY MOUTH TWICE DAILY 180 tablet 1   hydrALAZINE (APRESOLINE) 50 MG tablet Take 1 tablet (50 mg total) by mouth 3 (three) times daily. 90 tablet 2   Lidocaine 4 % PTCH Place onto the skin.     losartan (COZAAR) 100 MG tablet TAKE 1 TABLET BY MOUTH ONCE DAILY 90 tablet 2   LUMIGAN 0.01 % SOLN Place into both eyes 2 (two) times daily.     rosuvastatin (CRESTOR) 10 MG tablet Take 1 tablet (10 mg total) by mouth daily. 90 tablet 3   predniSONE (DELTASONE) 20 MG tablet 2 tabs po daily for 5 days, then 1 tab po daily for 5 days (Patient not taking: Reported on 04/08/2021) 15 tablet 0   No facility-administered medications prior to visit.     Per HPI unless specifically indicated in ROS section below Review of Systems Objective:  BP 140/70   Pulse 60   Temp 98.3 F (36.8 C) (Temporal)   Ht 5' 3"  (1.6 m)   Wt 138 lb 4 oz (62.7 kg)   SpO2 99%   BMI  24.49 kg/m   Wt Readings from Last 3 Encounters:  04/20/21 138 lb 4 oz (62.7 kg)  04/08/21 140 lb (63.5 kg)  03/29/21 140 lb 8 oz (63.7 kg)      Physical Exam Constitutional:      General: She is not in acute distress.    Appearance: Normal appearance. She is well-developed. She is not ill-appearing or toxic-appearing.  HENT:     Head: Normocephalic.     Right Ear: Hearing, tympanic membrane, ear canal and external ear normal. Tympanic membrane is not erythematous, retracted or bulging.     Left Ear: Hearing, tympanic membrane, ear canal and external ear normal. Tympanic membrane is not erythematous, retracted or bulging.     Nose: No mucosal edema or rhinorrhea.     Right Sinus: No maxillary sinus tenderness or frontal sinus tenderness.     Left Sinus: No maxillary sinus tenderness or frontal sinus tenderness.      Mouth/Throat:     Pharynx: Uvula midline.  Eyes:     General: Lids are normal. Lids are everted, no foreign bodies appreciated.     Conjunctiva/sclera: Conjunctivae normal.     Pupils: Pupils are equal, round, and reactive to light.  Neck:     Thyroid: No thyroid mass or thyromegaly.     Vascular: No carotid bruit.     Trachea: Trachea normal.  Cardiovascular:     Rate and Rhythm: Normal rate and regular rhythm.     Pulses: Normal pulses.     Heart sounds: Normal heart sounds, S1 normal and S2 normal. No murmur heard.   No friction rub. No gallop.  Pulmonary:     Effort: Pulmonary effort is normal. No tachypnea or respiratory distress.     Breath sounds: Normal breath sounds. No decreased breath sounds, wheezing, rhonchi or rales.  Abdominal:     General: Bowel sounds are normal.     Palpations: Abdomen is soft.     Tenderness: There is no abdominal tenderness.  Musculoskeletal:     Cervical back: Normal range of motion and neck supple.     Lumbar back: No tenderness or bony tenderness. Normal range of motion. Positive right straight leg raise test. Negative left straight leg raise test.     Left hip: Tenderness and bony tenderness present. No crepitus. Decreased range of motion.       Legs:  Skin:    General: Skin is warm and dry.     Findings: No rash.  Neurological:     Mental Status: She is alert.  Psychiatric:        Mood and Affect: Mood is not anxious or depressed.        Speech: Speech normal.        Behavior: Behavior normal. Behavior is cooperative.        Thought Content: Thought content normal.        Judgment: Judgment normal.      Results for orders placed or performed in visit on 01/22/21  Iron and TIBC  Result Value Ref Range   Iron 69 28 - 170 ug/dL   TIBC 309 250 - 450 ug/dL   Saturation Ratios 22 10.4 - 31.8 %   UIBC 240 ug/dL  Ferritin  Result Value Ref Range   Ferritin 89 11 - 307 ng/mL  Vitamin B6  Result Value Ref Range   Vitamin B6 39.5 3.4  - 65.2 ug/L  Vitamin B1  Result Value Ref Range   Vitamin B1 (  Thiamine) 110.9 66.5 - 200.0 nmol/L  Haptoglobin  Result Value Ref Range   Haptoglobin 139 42 - 346 mg/dL  Comprehensive metabolic panel  Result Value Ref Range   Sodium 137 135 - 145 mmol/L   Potassium 3.9 3.5 - 5.1 mmol/L   Chloride 106 98 - 111 mmol/L   CO2 23 22 - 32 mmol/L   Glucose, Bld 120 (H) 70 - 99 mg/dL   BUN 35 (H) 8 - 23 mg/dL   Creatinine, Ser 1.52 (H) 0.44 - 1.00 mg/dL   Calcium 9.4 8.9 - 10.3 mg/dL   Total Protein 7.7 6.5 - 8.1 g/dL   Albumin 4.6 3.5 - 5.0 g/dL   AST 23 15 - 41 U/L   ALT 14 0 - 44 U/L   Alkaline Phosphatase 49 38 - 126 U/L   Total Bilirubin 0.6 0.3 - 1.2 mg/dL   GFR, Estimated 35 (L) >60 mL/min   Anion gap 8 5 - 15  Reticulocytes  Result Value Ref Range   Retic Ct Pct 1.1 0.4 - 3.1 %   RBC. 3.30 (L) 3.87 - 5.11 MIL/uL   Retic Count, Absolute 36.6 19.0 - 186.0 K/uL   Immature Retic Fract 4.3 2.3 - 15.9 %  Lactate dehydrogenase  Result Value Ref Range   LDH 141 98 - 192 U/L  Kappa/lambda light chains  Result Value Ref Range   Kappa free light chain 35.7 (H) 3.3 - 19.4 mg/L   Lambda free light chains 24.5 5.7 - 26.3 mg/L   Kappa, lambda light chain ratio 1.46 0.26 - 1.65  Multiple Myeloma Panel (SPEP&IFE w/QIG)  Result Value Ref Range   IgG (Immunoglobin G), Serum 1,348 586 - 1,602 mg/dL   IgA 261 64 - 422 mg/dL   IgM (Immunoglobulin M), Srm 126 26 - 217 mg/dL   Total Protein ELP 7.2 6.0 - 8.5 g/dL   Albumin SerPl Elph-Mcnc 4.0 2.9 - 4.4 g/dL   Alpha 1 0.2 0.0 - 0.4 g/dL   Alpha2 Glob SerPl Elph-Mcnc 0.7 0.4 - 1.0 g/dL   B-Globulin SerPl Elph-Mcnc 0.9 0.7 - 1.3 g/dL   Gamma Glob SerPl Elph-Mcnc 1.4 0.4 - 1.8 g/dL   M Protein SerPl Elph-Mcnc Not Observed Not Observed g/dL   Globulin, Total 3.2 2.2 - 3.9 g/dL   Albumin/Glob SerPl 1.3 0.7 - 1.7   IFE 1 Comment    Please Note Comment   CBC with Differential/Platelet  Result Value Ref Range   WBC 5.5 4.0 - 10.5 K/uL   RBC  3.35 (L) 3.87 - 5.11 MIL/uL   Hemoglobin 10.3 (L) 12.0 - 15.0 g/dL   HCT 30.9 (L) 36.0 - 46.0 %   MCV 92.2 80.0 - 100.0 fL   MCH 30.7 26.0 - 34.0 pg   MCHC 33.3 30.0 - 36.0 g/dL   RDW 12.9 11.5 - 15.5 %   Platelets 178 150 - 400 K/uL   nRBC 0.0 0.0 - 0.2 %   Neutrophils Relative % 49 %   Neutro Abs 2.8 1.7 - 7.7 K/uL   Lymphocytes Relative 38 %   Lymphs Abs 2.1 0.7 - 4.0 K/uL   Monocytes Relative 9 %   Monocytes Absolute 0.5 0.1 - 1.0 K/uL   Eosinophils Relative 2 %   Eosinophils Absolute 0.1 0.0 - 0.5 K/uL   Basophils Relative 1 %   Basophils Absolute 0.1 0.0 - 0.1 K/uL   Immature Granulocytes 1 %   Abs Immature Granulocytes 0.03 0.00 - 0.07 K/uL  This visit occurred during the SARS-CoV-2 public health emergency.  Safety protocols were in place, including screening questions prior to the visit, additional usage of staff PPE, and extensive cleaning of exam room while observing appropriate contact time as indicated for disinfecting solutions.   COVID 19 screen:  No recent travel or known exposure to COVID19 The patient denies respiratory symptoms of COVID 19 at this time. The importance of social distancing was discussed today.   Assessment and Plan    Problem List Items Addressed This Visit     Acute pain of left hip - Primary     X-ray showed no fracture. The patient has failed 6 weeks of conservative treatment including   Prednisone, Voltaren gel, lidocaine patches, tylenol and muscle relaxant. NSAIDs contraindicated given on Eliquis.  Given keeping her up at night and lack of improvement after 6 weeks... refer for  [name] to eval left hip. No fever. Will likely also need Ortho referral          Eliezer Lofts, MD

## 2021-04-20 NOTE — Patient Instructions (Signed)
We will work on approval of [name]. If it is denied or takes a while we can move forward with Ortho referral.   Continue lidocaine patches.  Can use tramadol at bedtime for pain.  Can use heat on left hip.

## 2021-04-27 ENCOUNTER — Other Ambulatory Visit: Payer: Medicare Other

## 2021-04-27 ENCOUNTER — Inpatient Hospital Stay: Payer: Medicare Other | Attending: Oncology

## 2021-04-27 ENCOUNTER — Other Ambulatory Visit: Payer: Self-pay

## 2021-04-27 ENCOUNTER — Ambulatory Visit: Payer: Medicare Other | Admitting: Oncology

## 2021-04-27 ENCOUNTER — Inpatient Hospital Stay: Payer: Medicare Other | Admitting: Oncology

## 2021-04-27 VITALS — BP 128/66 | HR 56 | Temp 98.0°F | Resp 18

## 2021-04-27 DIAGNOSIS — D649 Anemia, unspecified: Secondary | ICD-10-CM | POA: Diagnosis not present

## 2021-04-27 DIAGNOSIS — M25552 Pain in left hip: Secondary | ICD-10-CM | POA: Diagnosis not present

## 2021-04-27 DIAGNOSIS — M069 Rheumatoid arthritis, unspecified: Secondary | ICD-10-CM | POA: Insufficient documentation

## 2021-04-27 DIAGNOSIS — E860 Dehydration: Secondary | ICD-10-CM | POA: Diagnosis not present

## 2021-04-27 DIAGNOSIS — Z79899 Other long term (current) drug therapy: Secondary | ICD-10-CM | POA: Insufficient documentation

## 2021-04-27 DIAGNOSIS — M8580 Other specified disorders of bone density and structure, unspecified site: Secondary | ICD-10-CM | POA: Diagnosis not present

## 2021-04-27 DIAGNOSIS — I480 Paroxysmal atrial fibrillation: Secondary | ICD-10-CM | POA: Diagnosis not present

## 2021-04-27 DIAGNOSIS — N189 Chronic kidney disease, unspecified: Secondary | ICD-10-CM | POA: Diagnosis not present

## 2021-04-27 DIAGNOSIS — Z7901 Long term (current) use of anticoagulants: Secondary | ICD-10-CM | POA: Diagnosis not present

## 2021-04-27 DIAGNOSIS — D631 Anemia in chronic kidney disease: Secondary | ICD-10-CM | POA: Diagnosis not present

## 2021-04-27 LAB — CBC WITH DIFFERENTIAL/PLATELET
Abs Immature Granulocytes: 0.01 10*3/uL (ref 0.00–0.07)
Basophils Absolute: 0.1 10*3/uL (ref 0.0–0.1)
Basophils Relative: 1 %
Eosinophils Absolute: 0.1 10*3/uL (ref 0.0–0.5)
Eosinophils Relative: 2 %
HCT: 31 % — ABNORMAL LOW (ref 36.0–46.0)
Hemoglobin: 10.4 g/dL — ABNORMAL LOW (ref 12.0–15.0)
Immature Granulocytes: 0 %
Lymphocytes Relative: 36 %
Lymphs Abs: 1.6 10*3/uL (ref 0.7–4.0)
MCH: 31.5 pg (ref 26.0–34.0)
MCHC: 33.5 g/dL (ref 30.0–36.0)
MCV: 93.9 fL (ref 80.0–100.0)
Monocytes Absolute: 0.3 10*3/uL (ref 0.1–1.0)
Monocytes Relative: 7 %
Neutro Abs: 2.3 10*3/uL (ref 1.7–7.7)
Neutrophils Relative %: 54 %
Platelets: 182 10*3/uL (ref 150–400)
RBC: 3.3 MIL/uL — ABNORMAL LOW (ref 3.87–5.11)
RDW: 12.4 % (ref 11.5–15.5)
WBC: 4.3 10*3/uL (ref 4.0–10.5)
nRBC: 0 % (ref 0.0–0.2)

## 2021-04-27 LAB — COMPREHENSIVE METABOLIC PANEL
ALT: 14 U/L (ref 0–44)
AST: 22 U/L (ref 15–41)
Albumin: 4.2 g/dL (ref 3.5–5.0)
Alkaline Phosphatase: 41 U/L (ref 38–126)
Anion gap: 9 (ref 5–15)
BUN: 33 mg/dL — ABNORMAL HIGH (ref 8–23)
CO2: 24 mmol/L (ref 22–32)
Calcium: 9.6 mg/dL (ref 8.9–10.3)
Chloride: 105 mmol/L (ref 98–111)
Creatinine, Ser: 2.01 mg/dL — ABNORMAL HIGH (ref 0.44–1.00)
GFR, Estimated: 25 mL/min — ABNORMAL LOW (ref >60)
Glucose, Bld: 117 mg/dL — ABNORMAL HIGH (ref 70–99)
Potassium: 3.7 mmol/L (ref 3.5–5.1)
Sodium: 138 mmol/L (ref 135–145)
Total Bilirubin: 1 mg/dL (ref 0.3–1.2)
Total Protein: 7.5 g/dL (ref 6.5–8.1)

## 2021-04-27 LAB — IRON AND TIBC
Iron: 87 ug/dL (ref 28–170)
Saturation Ratios: 31 % (ref 10.4–31.8)
TIBC: 277 ug/dL (ref 250–450)
UIBC: 190 ug/dL

## 2021-04-27 LAB — FERRITIN: Ferritin: 184 ng/mL (ref 11–307)

## 2021-04-27 MED ORDER — ONDANSETRON HCL 4 MG PO TABS: 4.0000 mg | ORAL_TABLET | Freq: Three times a day (TID) | ORAL | 0 refills | Status: DC | PRN

## 2021-04-27 NOTE — Progress Notes (Signed)
Hematology/Oncology Consult note [facility]  Telephone:([phone] [phone]  Patient Care Team: [name], MD as PCP - General [name], MD as PCP - Cardiology (Cardiology) Birder Robson, MD as Referring Physician (Ophthalmology)   Name of the patient: [name]  [id number]  Jan 10, 1941   Date of visit: 04/27/21  Diagnosis-normocytic anemia likely multifactorial secondary to chronic disease and anemia of chronic kidney disease  Chief complaint/ Reason for visit-follow-up  Heme/Onc history: patient is a 80 year old female with a past medical history significant for atrial fibrillation, paroxysmal supraventricular tachycardia, hypertension, coronary artery disease referred for anemia.  Her most recent CBC with differential from 11/11/2020 showed a white count of 5.4, H&H of 10.6/32.1 with an MCV of 92 and a platelet count of 183.  Looking back at her prior CBCs patient has always had a normal white count and a platelet count and her hemoglobin has typically remained between 10-11 since 2009.  Patient recently had iron studies, TSH and B12 checked on 11/11/2020 which was within normal limits.    Interval history-patient was last seen in clinic on 01/26/2021.  In the interim, she was seen by cardiology for hypertension, uncontrolled hyperlipidemia and atrial fibrillation on Eliquis.  She appeared to be doing well.  She was also seen by her primary care doctor for ongoing hip pain.  Symptoms thought to be due to referred pain from her chronic low back pain.  She was instructed to try range of motion exercises and to remain as active as possible.  She did have some imaging which did not show any acute abnormality.  Given persistence, she was referred to Ortho and is scheduled to have an [name] of her hip tomorrow.  She was diagnosed with COVID on 04/08/2021 and was prescribed molnupiravir.  She continues to recover.  Husband reports that her  appetite is still poor.  She has lost a few pounds since her visit in June.  Overall, she has been feeling poorly.  Reports nausea with occasional vomiting with food since she has had COVID.  She also reports insomnia secondary to hip pain.  She was recently prescribed tramadol for her hip pain which appears to be helping some.  Sometimes she takes this on an empty stomach.  ECOG PS- 1 Pain scale- 0   Review of systems- Review of Systems  Constitutional:  Positive for malaise/fatigue and weight loss. Negative for chills and fever.  HENT:  Negative for congestion, ear pain and tinnitus.   Eyes: Negative.  Negative for blurred vision and double vision.  Respiratory: Negative.  Negative for cough, sputum production and shortness of breath.   Cardiovascular: Negative.  Negative for chest pain, palpitations and leg swelling.  Gastrointestinal: Negative.  Negative for abdominal pain, constipation, diarrhea, nausea and vomiting.  Genitourinary:  Negative for dysuria, frequency and urgency.  Musculoskeletal:  Negative for back pain and falls.       Hip pain  Skin: Negative.  Negative for rash.  Neurological:  Positive for weakness. Negative for headaches.  Endo/Heme/Allergies: Negative.  Does not bruise/bleed easily.  Psychiatric/Behavioral:  Negative for depression. The patient has insomnia. The patient is not nervous/anxious.       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 cath 2/09: no CAD, EF 70%   HYPERLIPIDEMIA    HYPERTENSION    Mild Aortic insufficiency    Moderate mitral regurgitation    Moderate tricuspid regurgitation    OSTEOPENIA    PAF (paroxysmal atrial fibrillation) ([place])     a. s/p ablation 2013; b. CHADS2VASc -> 4 (HTN, age x 2, female)-->Eliquis.   PAT (paroxysmal atrial tachycardia) ([place])    a. 04/2019 Zio: Occas PACs and rare PVCs. 21 atrial runs - longest 20 beats, max rate 169.   Pneumonia 2009   RA (rheumatoid arthritis) ([facility])    Sinus Bradycardia    a. asymptomatic but prevents use of AVN blocking agents; b. 04/2019 Zio: Avg HR 61 (37-109).   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 05/2019 Echo: EF 60-65%. DD. RVSP 41.50mHg. Mod dil LA. Mod MR/TR. Mild AI.     Past Surgical History:  Procedure Laterality Date   ABLATION OF DYSRHYTHMIC FOCUS     CARDIAC CATHETERIZATION     COLONOSCOPY  04/18/08   Partial hysterectomy--1979     Thoracentesis   12/20/07      Social History   Socioeconomic History   Marital status: Widowed    Spouse name: Not on file   Number of children: Not on file   Years of education: Not on file   Highest education level: Not on file  Occupational History   Not on file  Tobacco Use   Smoking status: Never   Smokeless tobacco: Never  Vaping Use   Vaping Use: Never used  Substance and Sexual Activity   Alcohol use: No    Alcohol/week: 0.0 standard drinks   Drug use: No   Sexual activity: Never  Other Topics Concern   Not on file  Social History Narrative   Marital Status: widow x 1 yr   Children: 547 grandchildren [phone] numerous great grand children   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 Full Code ( reviewed 2015)       Occasional exercise.   Diet: fruits and veggies, lean meats.   Social Determinants of Health   Financial Resource Strain: Not on file  Food Insecurity: Not on file  Transportation Needs: Not on file   Physical Activity: Not on file  Stress: Not on file  Social Connections: Not on file  Intimate Partner Violence: Not on file    Family History  Problem Relation Age of Onset   Diabetes Other    Breast cancer Paternal Aunt [phone]  Heart disease Mother    Pancreatic cancer Father    Atrial fibrillation Brother    Heart disease Brother    Heart attack Brother      Current Outpatient Medications:    B Complex Vitamins (VITAMIN B COMPLEX) TABS, Take 1 tablet by mouth daily. , Disp: , Rfl:    chlorthalidone (HYGROTON) 25 MG tablet, Take 1 tablet (25 mg total) by mouth daily., Disp: 90 tablet, Rfl: 3   cholecalciferol (VITAMIN D3) 25 MCG (1000 UNIT) tablet, Take 1,000 Units by mouth daily., Disp: , Rfl:    ELIQUIS 5 MG TABS tablet, TAKE 1 TABLET BY MOUTH  TWICE DAILY, Disp: 180 tablet, Rfl: 1   hydrALAZINE (APRESOLINE) 50 MG tablet, Take 1 tablet (50 mg total) by mouth 3 (three) times daily., Disp: 90 tablet, Rfl: 2   Lidocaine 4 % PTCH, Place onto the skin., Disp: , Rfl:    losartan (COZAAR) 100 MG tablet, TAKE 1 TABLET BY MOUTH ONCE DAILY, Disp: 90 tablet, Rfl: 2   LUMIGAN 0.01 % SOLN, Place into both eyes 2 (two) times daily., Disp: , Rfl:    rosuvastatin (CRESTOR) 10 MG tablet, Take 1 tablet (10 mg total) by mouth daily., Disp: 90 tablet, Rfl: 3  Physical exam:  Vitals:   04/27/21 0959  BP: 128/66  Pulse: (!) 56  Resp: 18  Temp: 98 F (36.7 C)  TempSrc: Tympanic  SpO2: 100%   Physical Exam Vitals reviewed.  Constitutional:      Appearance: Normal appearance.  HENT:     Head: Normocephalic and atraumatic.  Eyes:     Pupils: Pupils are equal, round, and reactive to light.  Cardiovascular:     Rate and Rhythm: Normal rate and regular rhythm.     Heart sounds: Normal heart sounds. No murmur heard. Pulmonary:     Effort: Pulmonary effort is normal.     Breath sounds: Normal breath sounds. No wheezing.  Abdominal:     General: Bowel sounds are normal. There is no  distension.     Palpations: Abdomen is soft.     Tenderness: There is no abdominal tenderness.  Musculoskeletal:        General: Normal range of motion.     Cervical back: Normal range of motion.  Skin:    General: Skin is warm and dry.     Findings: No rash.  Neurological:     Mental Status: She is alert and oriented to person, place, and time.  Psychiatric:        Judgment: Judgment normal.     CMP Latest Ref Rng & Units 04/27/2021  Glucose 70 - 99 mg/dL 117(H)  BUN 8 - 23 mg/dL 33(H)  Creatinine 0.44 - 1.00 mg/dL 2.01(H)  Sodium 135 - 145 mmol/L 138  Potassium 3.5 - 5.1 mmol/L 3.7  Chloride 98 - 111 mmol/L 105  CO2 22 - 32 mmol/L 24  Calcium 8.9 - 10.3 mg/dL 9.6  Total Protein 6.5 - 8.1 g/dL 7.5  Total Bilirubin 0.3 - 1.2 mg/dL 1.0  Alkaline Phos 38 - 126 U/L 41  AST 15 - 41 U/L 22  ALT 0 - 44 U/L 14   CBC Latest Ref Rng & Units 04/27/2021  WBC 4.0 - 10.5 K/uL 4.3  Hemoglobin 12.0 - 15.0 g/dL 10.4(L)  Hematocrit 36.0 - 46.0 % 31.0(L)  Platelets 150 - 400 K/uL 182    No images are attached to the encounter.  No results found.    Assessment and plan- Patient is a 80 y.o. female referred for normocytic anemia likely multifactorial secondary to anemia of chronic disease and chronic kidney disease  Normocytic anemia- Secondary to CKD and anemia of chronic disease. Initial work-up showed negative multiple myeloma panel, normal LDH and reticulocyte count, normal haptoglobin, ferritin and iron studies were normal, B1 and B6 levels are normal. Labs from today show stable hemoglobin 10.4, MCV 93.9.  Her creatinine is up at 2.01. No indication for EPO at this time.  Could consider if hemoglobin is consistently below 10.  Elevated creatinine- Secondary to dehydration. Reports a poor appetite since having COVID about 2 and half weeks ago.  Creatinine is 2.01 today which is the highest its been. Offered IV fluids but patient thinks she is able to tolerate oral fluids at  home. Recommend repeat lab work in 2 to 3 weeks.  Poor appetite/weight loss- Secondary to COVID and likely hip pain. Reports nausea and occasional vomiting. She was recently prescribed tramadol which potentially could cause some of her nausea. Recommend antiemetics prior to food and/or pain medicine. Prescription sent in for Zofran 4 to 8 mg every 8 hours as needed. Also recommend dietary supplements such as boost or Ensure.  Some samples provided today.  Hip pain- She has been referred to orthopedics by her primary care doctor. She is scheduled for an [name] tomorrow. Continue tramadol as needed. Recommend she take her tramadol with food and/or Zofran.  Disposition: Can trial Zofran. Continue tramadol. Drink plenty of fluids. Repeat labs in 3 weeks to check creatinine. RTC in 3 months for follow-up with lab work (CBC, ferritin and iron studies, CMP and vitamin B12 level), MD assessment.  I spent 30 minutes dedicated to the care of this patient (face-to-face and non-face-to-face) on the date of the encounter to include what is described in the assessment and plan.   Visit Diagnosis 1. Dehydration   2. Normocytic anemia   3. Acute pain of left hip    Faythe Casa, NP 04/27/2021 11:37 AM

## 2021-04-28 ENCOUNTER — Ambulatory Visit
Admission: RE | Admit: 2021-04-28 | Discharge: 2021-04-28 | Disposition: A | Discharge status: Home / Self Care | Payer: Medicare Other | Source: Ambulatory Visit | Attending: Family Medicine | Admitting: Family Medicine

## 2021-04-28 DIAGNOSIS — M1612 Unilateral primary osteoarthritis, left hip: Secondary | ICD-10-CM | POA: Diagnosis not present

## 2021-04-28 DIAGNOSIS — S76312A Strain of muscle, fascia and tendon of the posterior muscle group at thigh level, left thigh, initial encounter: Secondary | ICD-10-CM | POA: Diagnosis not present

## 2021-04-28 DIAGNOSIS — M25552 Pain in left hip: Secondary | ICD-10-CM

## 2021-04-28 LAB — VITAMIN B12: Vitamin B-12: 576 pg/mL (ref 180–914)

## 2021-04-28 IMAGING — MR MR HIP*L* W/O CM
5 series · 34 of 40 positions shown · non-contrast
Comparison: X-ray [DATE]

CLINICAL DATA: Hip pain, chronic, no prior imaging. Failed 6 weeks
of conservative treatement, cannot sleep due to pain

EXAM:
MR OF THE LEFT HIP WITHOUT CONTRAST
TECHNIQUE: Multiplanar, multisequence MR imaging was performed. No intravenous
contrast was administered.

[Series 9: T2 fat-sat · coronal · left · 3.0mm · 0.89mm/px · 8 of 30 slices shown (1 of 2)]
[im 1/30]
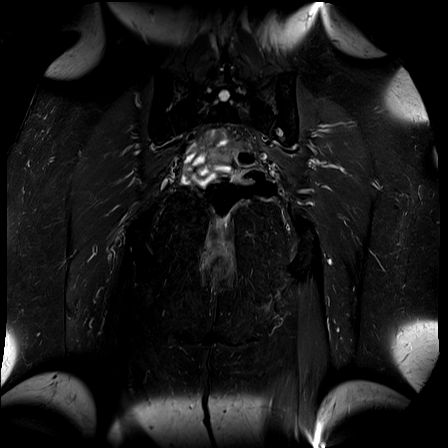
[im 5/30]
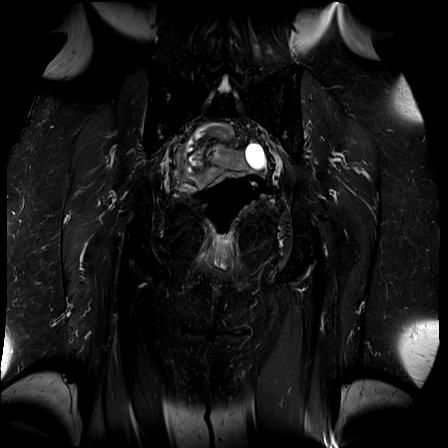
[im 9/30]
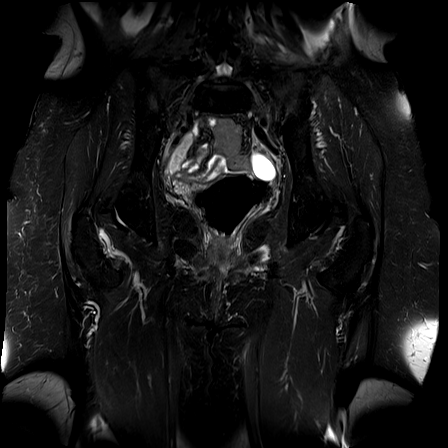
[im 13/30]
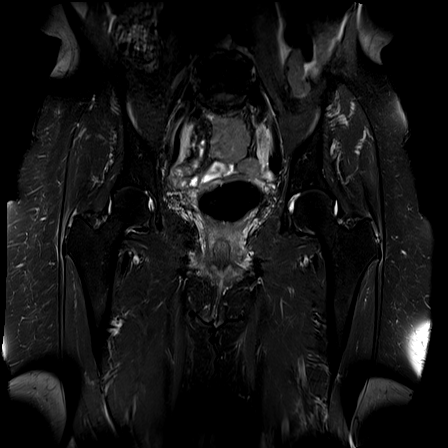
[im 17/30]
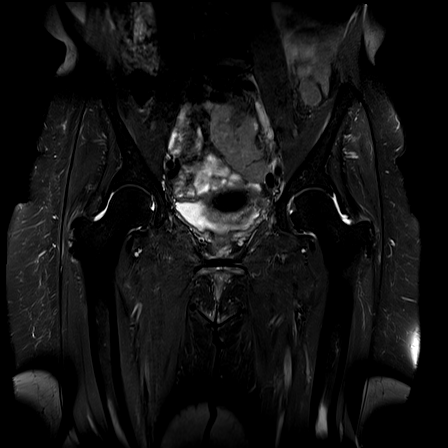
[im 21/30]
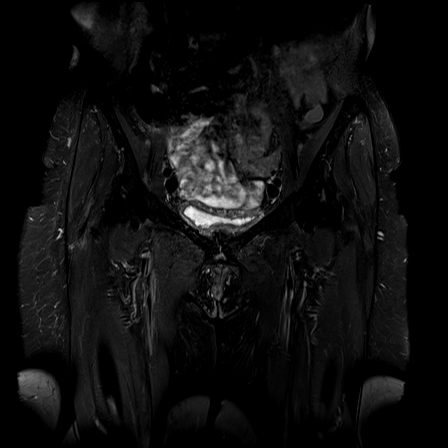
[im 25/30]
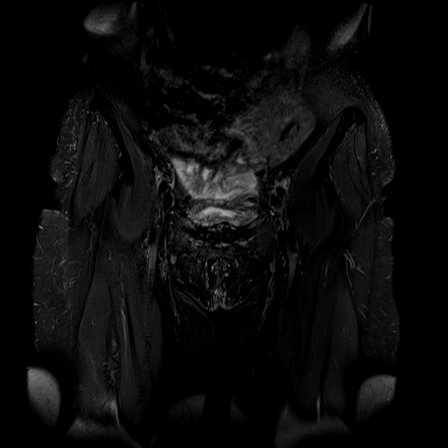
[im 30/30]
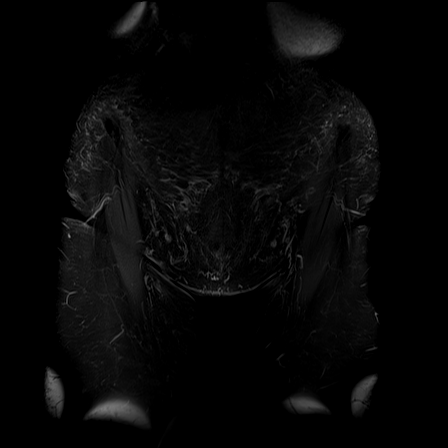

[Series 10: T1 · coronal · left · 3.0mm · 0.89mm/px · 2 of 30 slices shown]
[im 1/30]
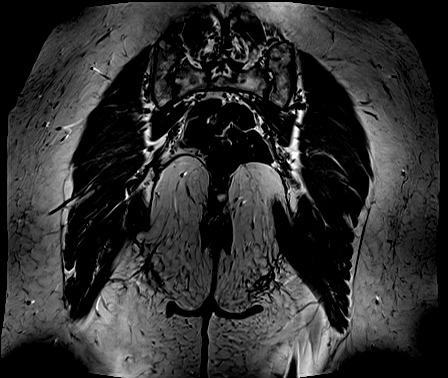
[im 5/30]
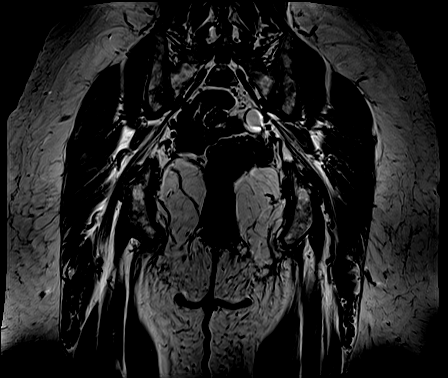

[Series 11: T2 fat-sat · axial · left · 3.0mm · 1.19mm/px · z∈[-23,+85]mm · 8 of 31 slices shown (2 of 2)]
[im 1/31]
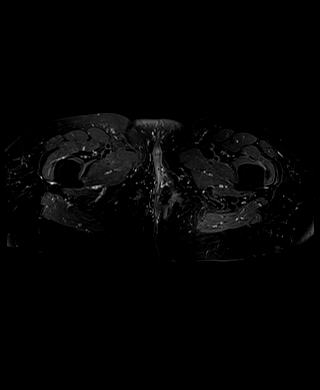
[im 5/31]
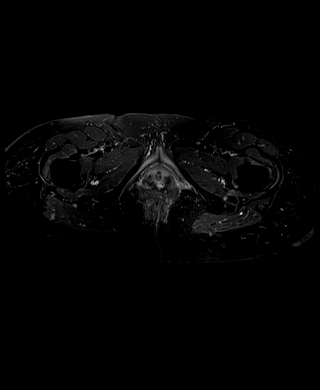
[im 9/31]
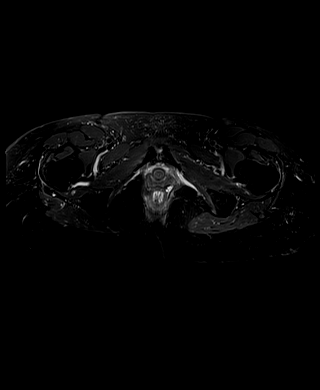
[im 13/31]
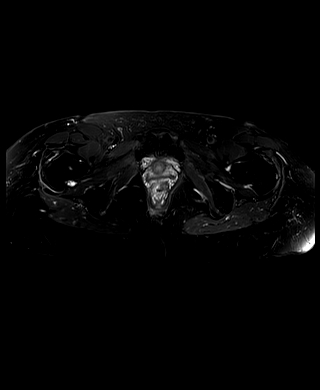
[im 18/31]
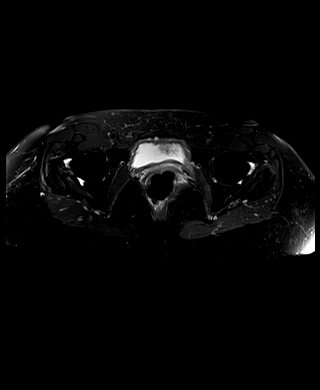
[im 22/31]
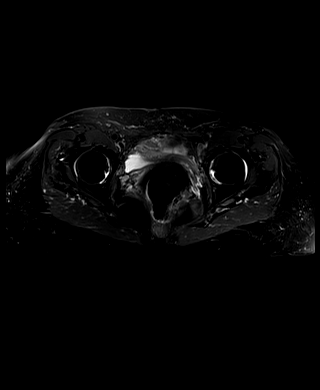
[im 26/31]
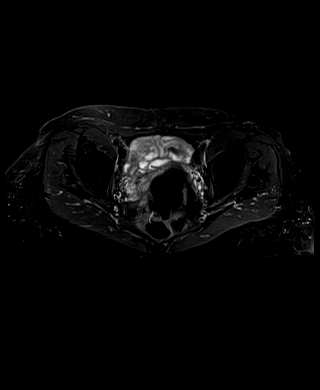
[im 31/31]
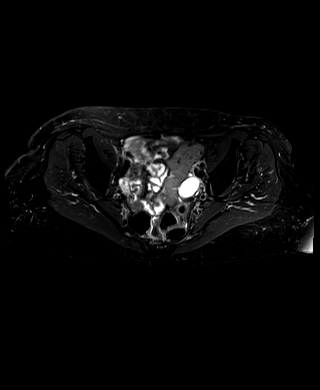

[Series 12: PD fat-sat · coronal · left · 3.0mm · 0.56mm/px · 7 of 28 slices shown (1 of 2)]
[im 1/28]
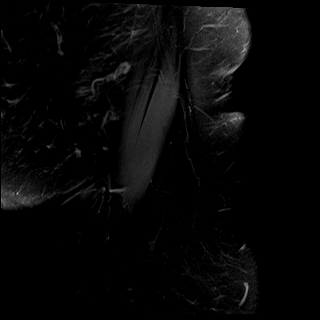
[im 5/28]
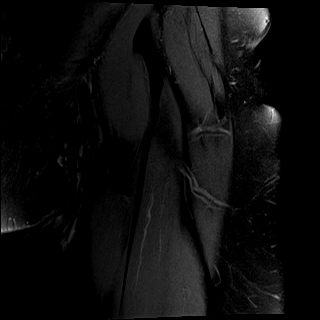
[im 10/28]
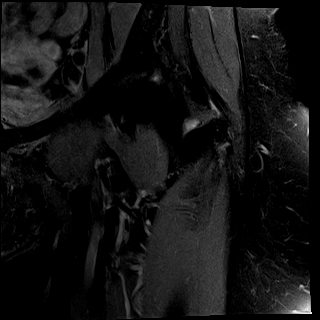
[im 14/28]
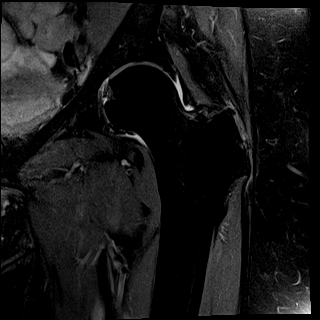
[im 19/28]
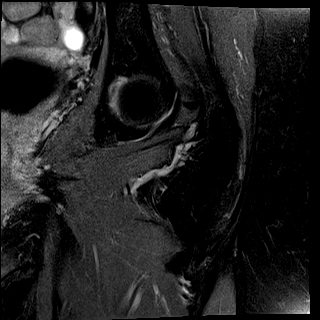
[im 23/28]
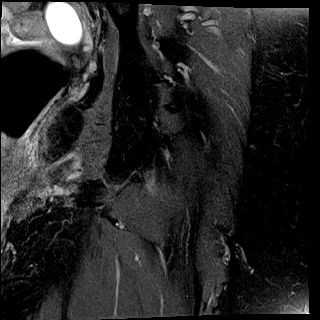
[im 28/28]
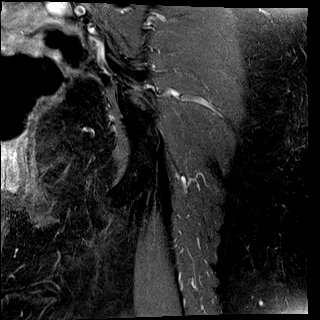

[Series 13: PD fat-sat · sagittal · left · 3.0mm · 0.56mm/px · 9 of 35 slices shown (2 of 2)]
[im 1/35]
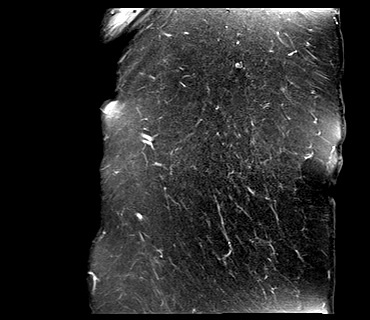
[im 5/35]
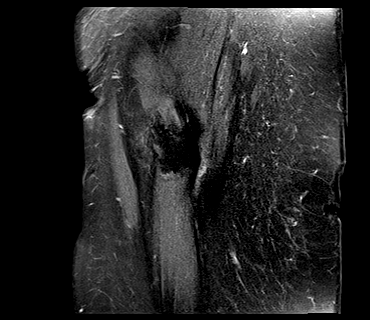
[im 9/35]
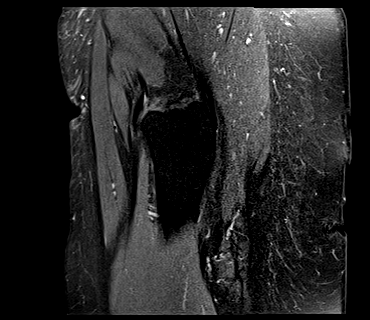
[im 13/35]
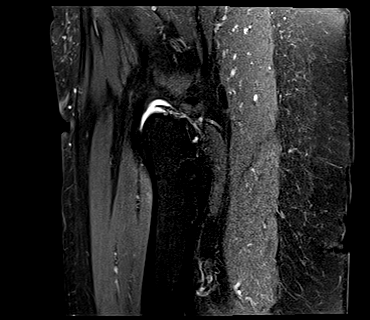
[im 18/35]
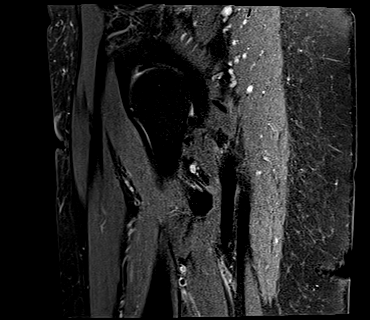
[im 22/35]
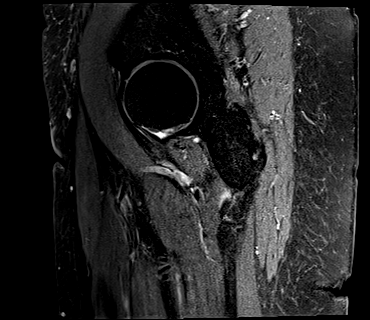
[im 26/35]
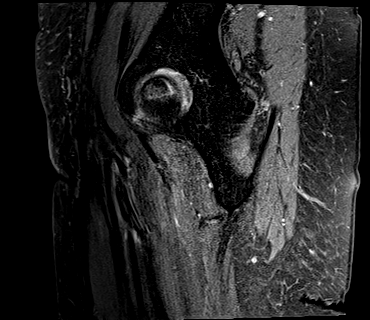
[im 30/35]
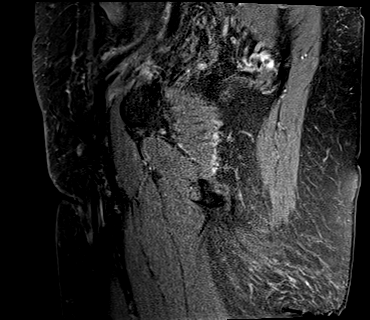
[im 35/35]
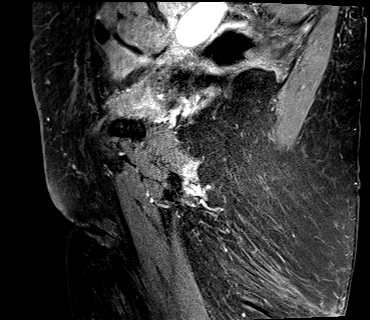

[34 of 40 positions shown; findings below may reference images not displayed]

FINDINGS: Bones: No acute fracture. No dislocation. No femoral head avascular
necrosis. Visualized bony pelvis intact without fracture or
diastasis. Mild arthropathy of the sacroiliac joints and pubic
symphysis. No bone marrow edema. No marrow replacing bone lesion.

Articular cartilage and labrum

Articular cartilage: Minimal chondral thinning along the posterior
aspect of the left hip joint. No cartilage defect. No subchondral
marrow signal changes.

Labrum: Mild anterosuperior labral degeneration/fraying (series 13,
images 13-14). No displaced labral tear or paralabral cyst.

Joint or bursal effusion

Joint effusion:  None.

Bursae: Mild bilateral peritrochanteric bursal edema. No focal
bursal fluid collections.

Muscles and tendons

Muscles and tendons: Tendinosis with low-grade insertional tearing
of the bilateral gluteus minimus tendons. Mild bilateral gluteus
medius tendinosis without tear. Mild tendinosis of the bilateral
hamstring tendon origins. Normal muscle bulk and signal intensity
without edema, atrophy, or fatty infiltration.

Other findings

Miscellaneous: Left adnexal cyst measuring up to 3.9 cm with
possible small nodule along its superior aspect (series 9, image 7).
No soft tissue edema or fluid collection. No inguinal
lymphadenopathy.
IMPRESSION: 1. No acute osseous findings.  Minimal left hip osteoarthritis.
2. Tendinosis with low-grade insertional tearing of the bilateral
gluteus minimus tendons.
3. Mild bilateral gluteus medius tendinosis without tear.
4. Mild tendinosis of the bilateral hamstring tendon origins.
5. Left adnexal cyst measuring up to 3.9 cm with possible small
nodule along its superior aspect. Pelvic ultrasound is recommended
for further characterization.

## 2021-04-29 ENCOUNTER — Other Ambulatory Visit: Payer: Self-pay | Admitting: Family Medicine

## 2021-04-29 DIAGNOSIS — R29898 Other symptoms and signs involving the musculoskeletal system: Secondary | ICD-10-CM

## 2021-04-29 DIAGNOSIS — M25552 Pain in left hip: Secondary | ICD-10-CM

## 2021-04-29 DIAGNOSIS — R9389 Abnormal findings on diagnostic imaging of other specified body structures: Secondary | ICD-10-CM

## 2021-04-29 DIAGNOSIS — N949 Unspecified condition associated with female genital organs and menstrual cycle: Secondary | ICD-10-CM

## 2021-04-29 DIAGNOSIS — R935 Abnormal findings on diagnostic imaging of other abdominal regions, including retroperitoneum: Secondary | ICD-10-CM

## 2021-04-29 NOTE — Progress Notes (Signed)
Pt notified.  Questions answered.  Referral to [facility] placed.  US pelvic transvag ordered to eval adnexal cyst.  Pt reports pain is some better with tramadol and she is now able to sleep at night.

## 2021-05-05 DIAGNOSIS — M545 Low back pain, unspecified: Secondary | ICD-10-CM | POA: Diagnosis not present

## 2021-05-10 ENCOUNTER — Other Ambulatory Visit: Payer: Self-pay | Admitting: Cardiovascular Disease

## 2021-05-10 ENCOUNTER — Telehealth: Payer: Self-pay | Admitting: *Deleted

## 2021-05-10 DIAGNOSIS — I10 Essential (primary) hypertension: Secondary | ICD-10-CM

## 2021-05-10 NOTE — Telephone Encounter (Signed)
Patient called stating that she has to cancel a trip that is scheduled 05/21/21. Patient stated that she needs a letter from Dr. Diona Browner stating that she is not able to go on this trip at this time because of health issues. Patient stated that Dr. Diona Browner can look at her notes and see that she is not able to travel now for health issues. Patient requested a call back when ready for pickup. Patient was advised that Dr Diona Browner is out of the office this week but will still send to her and her CMA.

## 2021-05-11 NOTE — Telephone Encounter (Signed)
[name] notified as instructed by telephone.  She thinks she can wait until next Tuesday for the letter.

## 2021-05-11 NOTE — Telephone Encounter (Signed)
I am aware of her limiting health issues and I am willing  give her a  letter.  Given I am not in town. If she needs it before  next Tuesday.. please write a letter base.. forward it to me and I will edit it then it can be stamped with my signature.

## 2021-05-12 NOTE — Telephone Encounter (Signed)
[name] called back to say she needed letter sooner that next week.  Draft written and sent to Dr. Diona Browner to review and edit.

## 2021-05-13 NOTE — Telephone Encounter (Signed)
[name] notified that her letter is ready to be picked up at the front desk.

## 2021-05-19 DIAGNOSIS — M545 Low back pain, unspecified: Secondary | ICD-10-CM | POA: Diagnosis not present

## 2021-05-21 DIAGNOSIS — M545 Low back pain, unspecified: Secondary | ICD-10-CM | POA: Diagnosis not present

## 2021-05-25 ENCOUNTER — Inpatient Hospital Stay: Payer: Medicare Other | Attending: Oncology

## 2021-05-25 DIAGNOSIS — D631 Anemia in chronic kidney disease: Secondary | ICD-10-CM | POA: Diagnosis not present

## 2021-05-25 DIAGNOSIS — I480 Paroxysmal atrial fibrillation: Secondary | ICD-10-CM | POA: Diagnosis not present

## 2021-05-25 DIAGNOSIS — Z79899 Other long term (current) drug therapy: Secondary | ICD-10-CM | POA: Diagnosis not present

## 2021-05-25 DIAGNOSIS — M8580 Other specified disorders of bone density and structure, unspecified site: Secondary | ICD-10-CM | POA: Diagnosis not present

## 2021-05-25 DIAGNOSIS — Z7901 Long term (current) use of anticoagulants: Secondary | ICD-10-CM | POA: Diagnosis not present

## 2021-05-25 DIAGNOSIS — M069 Rheumatoid arthritis, unspecified: Secondary | ICD-10-CM | POA: Diagnosis not present

## 2021-05-25 DIAGNOSIS — N189 Chronic kidney disease, unspecified: Secondary | ICD-10-CM | POA: Diagnosis not present

## 2021-05-25 DIAGNOSIS — D649 Anemia, unspecified: Secondary | ICD-10-CM

## 2021-05-25 LAB — COMPREHENSIVE METABOLIC PANEL
ALT: 11 U/L (ref 0–44)
AST: 18 U/L (ref 15–41)
Albumin: 4.1 g/dL (ref 3.5–5.0)
Alkaline Phosphatase: 40 U/L (ref 38–126)
Anion gap: 6 (ref 5–15)
BUN: 37 mg/dL — ABNORMAL HIGH (ref 8–23)
CO2: 28 mmol/L (ref 22–32)
Calcium: 9.4 mg/dL (ref 8.9–10.3)
Chloride: 103 mmol/L (ref 98–111)
Creatinine, Ser: 1.74 mg/dL — ABNORMAL HIGH (ref 0.44–1.00)
GFR, Estimated: 30 mL/min — ABNORMAL LOW (ref >60)
Glucose, Bld: 103 mg/dL — ABNORMAL HIGH (ref 70–99)
Potassium: 3.7 mmol/L (ref 3.5–5.1)
Sodium: 137 mmol/L (ref 135–145)
Total Bilirubin: 1 mg/dL (ref 0.3–1.2)
Total Protein: 7.4 g/dL (ref 6.5–8.1)

## 2021-05-25 LAB — CBC WITH DIFFERENTIAL/PLATELET
Abs Immature Granulocytes: 0 10*3/uL (ref 0.00–0.07)
Basophils Absolute: 0.1 10*3/uL (ref 0.0–0.1)
Basophils Relative: 1 %
Eosinophils Absolute: 0.1 10*3/uL (ref 0.0–0.5)
Eosinophils Relative: 2 %
HCT: 30.2 % — ABNORMAL LOW (ref 36.0–46.0)
Hemoglobin: 10 g/dL — ABNORMAL LOW (ref 12.0–15.0)
Immature Granulocytes: 0 %
Lymphocytes Relative: 31 %
Lymphs Abs: 1.4 10*3/uL (ref 0.7–4.0)
MCH: 31.6 pg (ref 26.0–34.0)
MCHC: 33.1 g/dL (ref 30.0–36.0)
MCV: 95.6 fL (ref 80.0–100.0)
Monocytes Absolute: 0.5 10*3/uL (ref 0.1–1.0)
Monocytes Relative: 10 %
Neutro Abs: 2.6 10*3/uL (ref 1.7–7.7)
Neutrophils Relative %: 56 %
Platelets: 173 10*3/uL (ref 150–400)
RBC: 3.16 MIL/uL — ABNORMAL LOW (ref 3.87–5.11)
RDW: 13.2 % (ref 11.5–15.5)
WBC: 4.6 10*3/uL (ref 4.0–10.5)
nRBC: 0 % (ref 0.0–0.2)

## 2021-05-31 DIAGNOSIS — H35371 Puckering of macula, right eye: Secondary | ICD-10-CM | POA: Diagnosis not present

## 2021-06-09 ENCOUNTER — Ambulatory Visit
Admission: RE | Admit: 2021-06-09 | Discharge: 2021-06-09 | Disposition: A | Discharge status: Home / Self Care | Payer: Medicare Other | Source: Ambulatory Visit | Attending: Family Medicine | Admitting: Family Medicine

## 2021-06-09 ENCOUNTER — Other Ambulatory Visit: Payer: Self-pay

## 2021-06-09 ENCOUNTER — Telehealth: Payer: Self-pay | Admitting: *Deleted

## 2021-06-09 DIAGNOSIS — N949 Unspecified condition associated with female genital organs and menstrual cycle: Secondary | ICD-10-CM | POA: Diagnosis present

## 2021-06-09 DIAGNOSIS — Z780 Asymptomatic menopausal state: Secondary | ICD-10-CM | POA: Diagnosis not present

## 2021-06-09 DIAGNOSIS — R9389 Abnormal findings on diagnostic imaging of other specified body structures: Secondary | ICD-10-CM | POA: Diagnosis not present

## 2021-06-09 DIAGNOSIS — R1909 Other intra-abdominal and pelvic swelling, mass and lump: Secondary | ICD-10-CM | POA: Diagnosis not present

## 2021-06-09 IMAGING — US US PELVIS COMPLETE WITH TRANSVAGINAL
1 series · 13 of 25 positions shown · non-contrast
Comparison: [name] LEFT hip [DATE]

CLINICAL DATA: Adnexal cyst on [name] of [DATE], for ultrasound
characterization; postmenopausal

EXAM:
TRANSABDOMINAL AND TRANSVAGINAL ULTRASOUND OF PELVIS
TECHNIQUE: Both transabdominal and transvaginal ultrasound examinations of the
pelvis were performed. Transabdominal technique was performed for
global imaging of the pelvis including uterus, ovaries, adnexal
regions, and pelvic cul-de-sac. It was necessary to proceed with
endovaginal exam following the transabdominal exam to visualize the
ovaries and adnexa.

[Series 1: us pelvis complete with transvaginal · 0.21mm/px · 75 acquisitions, 13 frames shown]
[im 1/75]
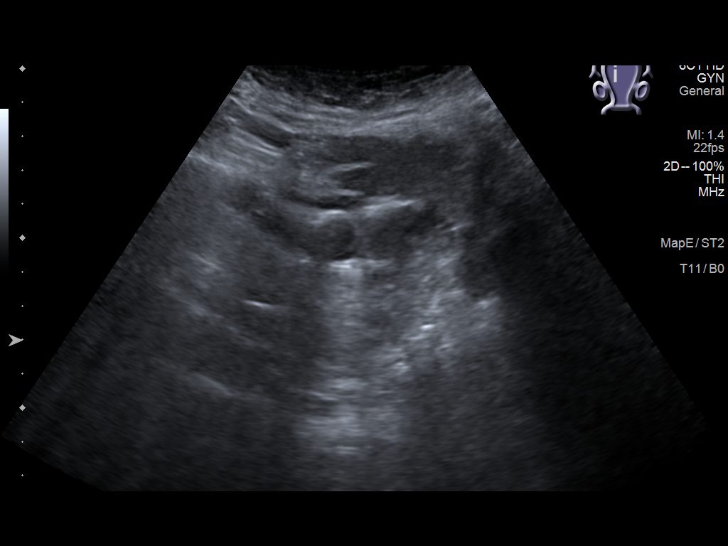
[im 7/75]
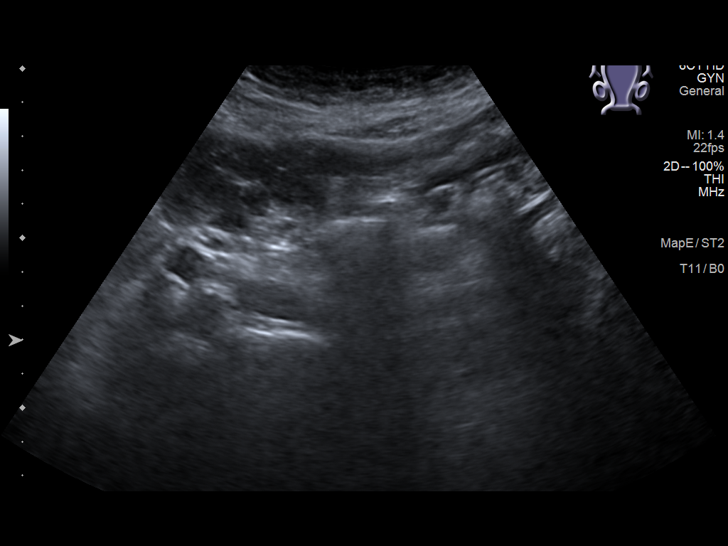
[im 13/75]
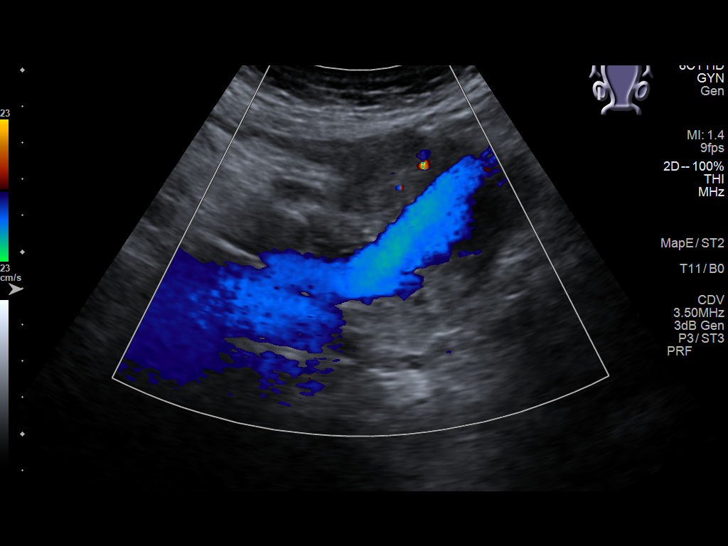
[im 19/75]
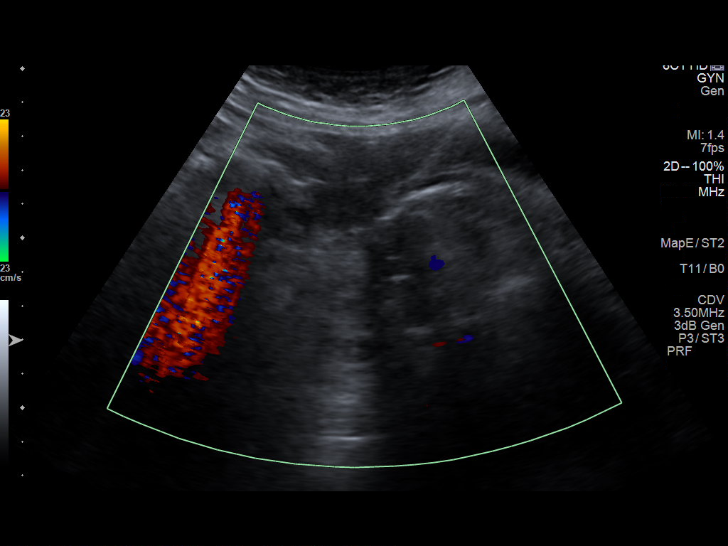
[im 25/75]
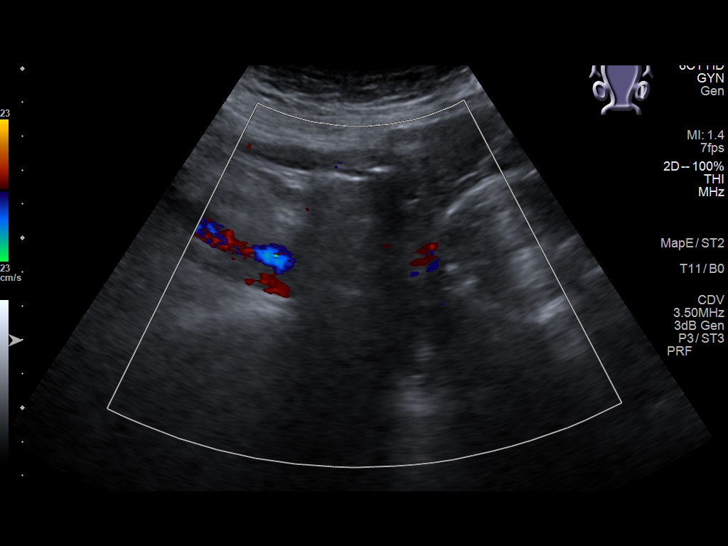
[im 31/75]
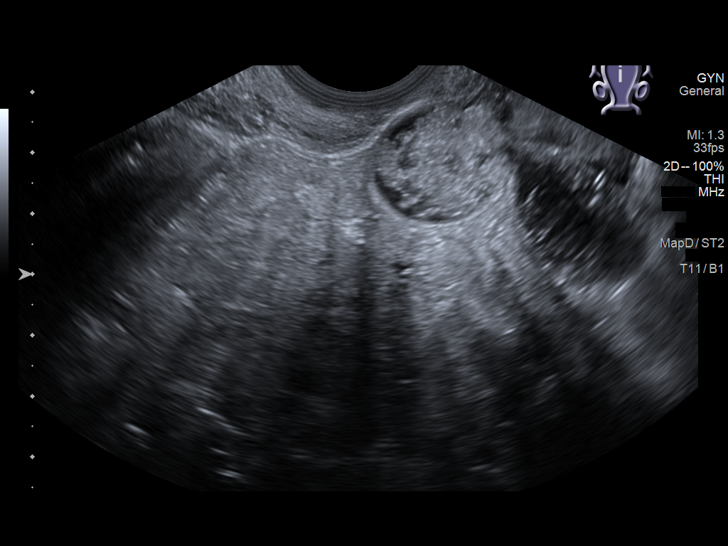
[im 38/75]
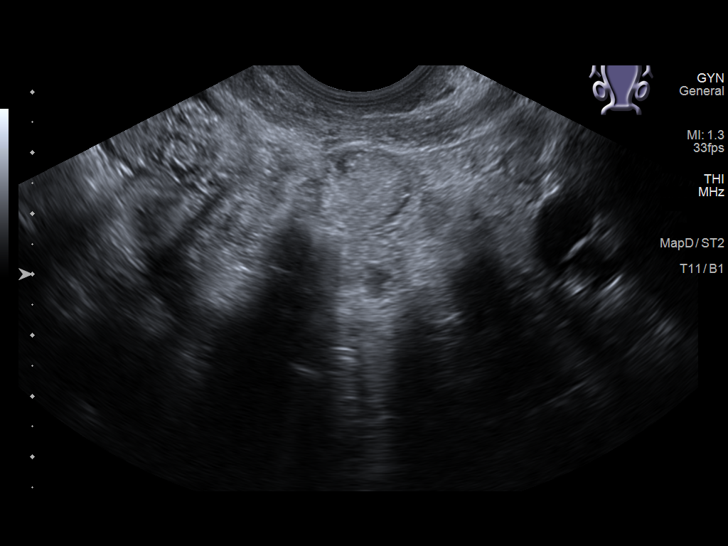
[im 44/75]
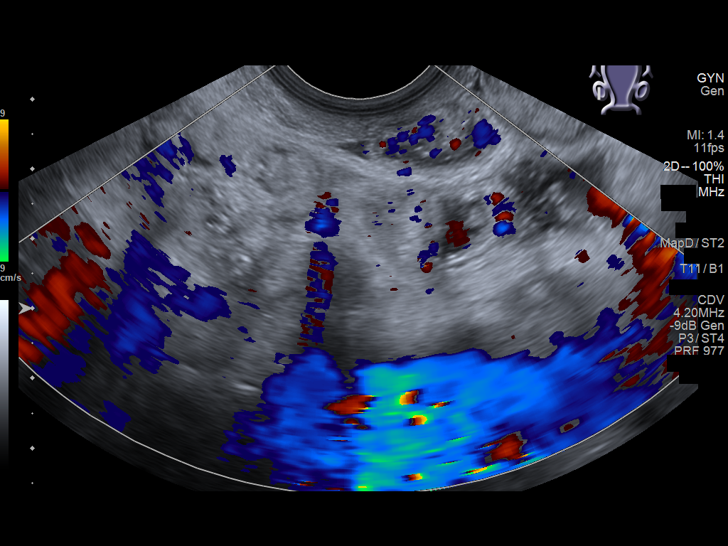
[im 50/75]
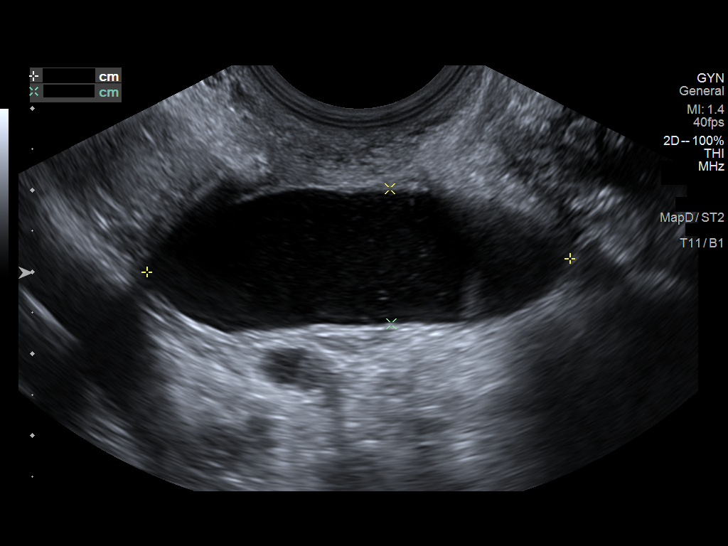
[im 56/75]
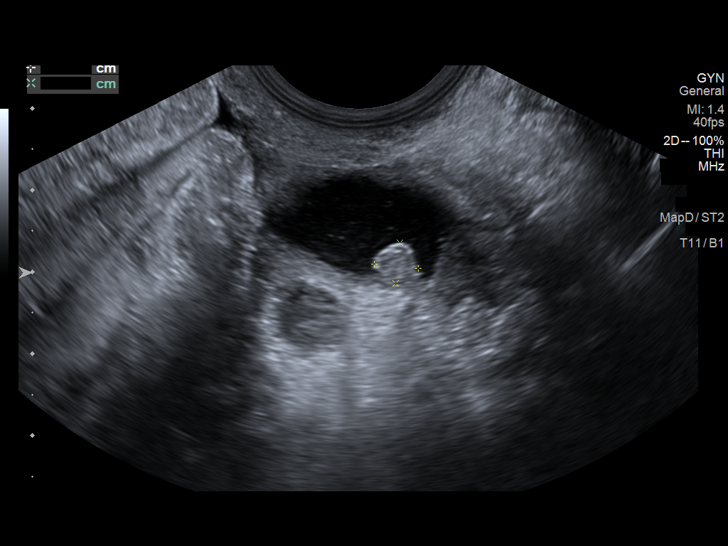
[im 62/75]
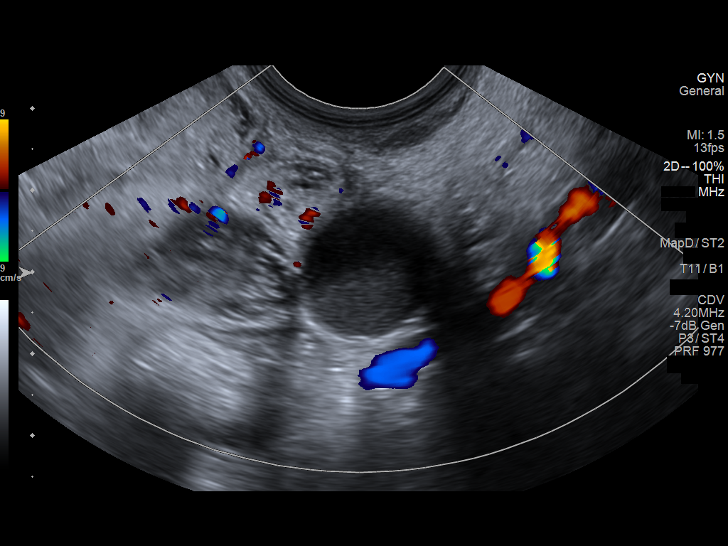
[im 68/75]
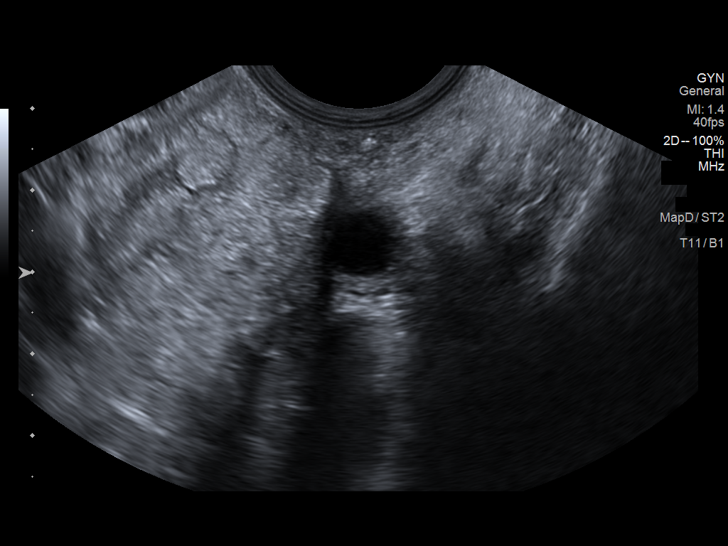
[im 75/75]
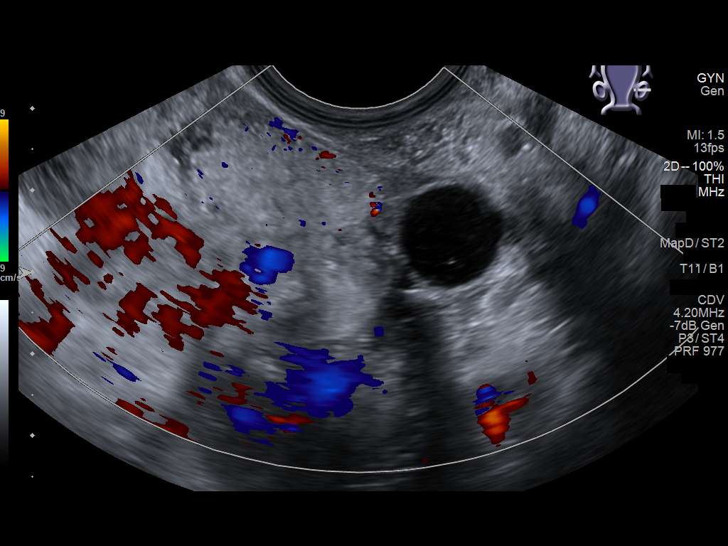

[13 of 25 positions shown; findings below may reference images not displayed]

FINDINGS: Uterus

Surgically absent

Endometrium

Surgically absent

Right ovary

Not visualized, question obscured by bowel

Left ovary

No LEFT normal ovary visualized, see below

Other findings

LEFT adnexal complex cystic mass 5.2 x 1.7 x 1.8 cm containing
scattered low level diffuse internal echogenicity and a mural nodule
which measures 5 x 5 x 5 mm. No blood flow seen within the mural
nodule on color Doppler imaging. Lesion corresponds to that seen on
MR, including presence of the small mural nodule. No free pelvic
fluid. No additional pelvic masses.
IMPRESSION: Post hysterectomy with nonvisualization of normal appearing ovaries.

5.2 x 1.7 x 1.8 cm diameter complex cystic nodule in LEFT adnexa
question of ovarian or paraovarian origin, complicated by scattered
internal echoes and a 5 mm mural nodule which lacks internal blood
flow on color Doppler imaging.

Either further characterization of this lesion by MR imaging with
and without contrast or surgical evaluation recommended.

These results will be called to the ordering clinician or
representative by the Radiologist Assistant, and communication
documented in the PACS or [REDACTED].

## 2021-06-09 NOTE — Telephone Encounter (Signed)
[name] left a voicemail stating that he was calling to report on a pelvis ultrasound and requested a call back.  Called [facility] ultrasound back and spoke to Red. Red stated that they were calling to let Dr. Diona Browner know that the results should be in Epic. Message sent to Dr. Diona Browner.

## 2021-06-10 ENCOUNTER — Other Ambulatory Visit: Payer: Self-pay | Admitting: Family Medicine

## 2021-06-10 DIAGNOSIS — R935 Abnormal findings on diagnostic imaging of other abdominal regions, including retroperitoneum: Secondary | ICD-10-CM

## 2021-06-10 DIAGNOSIS — N9489 Other specified conditions associated with female genital organs and menstrual cycle: Secondary | ICD-10-CM

## 2021-06-10 NOTE — Telephone Encounter (Signed)
Noted  

## 2021-06-22 ENCOUNTER — Telehealth: Payer: Self-pay | Admitting: Family Medicine

## 2021-06-22 NOTE — Telephone Encounter (Signed)
Pt is requesting a call. She states she was supposed to decide whether she wanted to have surgery done or not and she wants to discuss it preferably with Dr. Diona Browner. Did not want an appointment. Advised I will send message to CMA.

## 2021-06-23 NOTE — Telephone Encounter (Signed)
Ms. Auerbach notified as instructed by telephone.

## 2021-06-23 NOTE — Telephone Encounter (Signed)
Let her know I will call her on Thursday.

## 2021-06-24 NOTE — Telephone Encounter (Signed)
Called patient.  Discussed ORTHO plans for hip pain and abn [name]  As well as pending referral to [facility] for eval of cystic lesion in left adnexa.  [name] .Marland Kitchen can you given me an update on the status of the GYN ONC referral? It was placed on 06/10/2021

## 2021-07-08 NOTE — Telephone Encounter (Signed)
Referral was sent 06/10/21 to [facility] Referral was marked routine which is why it most likely has not been reviewed it.  I have changed it to urgent to flag them.   I called to follow up on referral and they are closed  Will have to try again tomorrow Phone: [phone]

## 2021-07-23 ENCOUNTER — Other Ambulatory Visit: Payer: Self-pay | Admitting: Family Medicine

## 2021-07-23 ENCOUNTER — Other Ambulatory Visit: Payer: Self-pay | Admitting: Oncology

## 2021-07-23 NOTE — Telephone Encounter (Signed)
Last filled 04-20-21 #15 Last OV 04-20-21 No Future OV [facility]

## 2021-07-26 ENCOUNTER — Encounter (HOSPITAL_BASED_OUTPATIENT_CLINIC_OR_DEPARTMENT_OTHER): Payer: Self-pay | Admitting: Cardiovascular Disease

## 2021-07-26 ENCOUNTER — Other Ambulatory Visit: Payer: Self-pay

## 2021-07-26 ENCOUNTER — Ambulatory Visit (HOSPITAL_BASED_OUTPATIENT_CLINIC_OR_DEPARTMENT_OTHER): Payer: Medicare Other | Admitting: Cardiovascular Disease

## 2021-07-26 VITALS — BP 142/74 | HR 65 | Ht 63.0 in | Wt 142.9 lb

## 2021-07-26 DIAGNOSIS — I4819 Other persistent atrial fibrillation: Secondary | ICD-10-CM

## 2021-07-26 DIAGNOSIS — I10 Essential (primary) hypertension: Secondary | ICD-10-CM

## 2021-07-26 DIAGNOSIS — I2510 Atherosclerotic heart disease of native coronary artery without angina pectoris: Secondary | ICD-10-CM

## 2021-07-26 DIAGNOSIS — N1832 Chronic kidney disease, stage 3b: Secondary | ICD-10-CM

## 2021-07-26 DIAGNOSIS — E782 Mixed hyperlipidemia: Secondary | ICD-10-CM

## 2021-07-26 DIAGNOSIS — K219 Gastro-esophageal reflux disease without esophagitis: Secondary | ICD-10-CM | POA: Diagnosis not present

## 2021-07-26 HISTORY — DX: Gastro-esophageal reflux disease without esophagitis: K21.9

## 2021-07-26 MED ORDER — CLONIDINE HCL 0.1 MG PO TABS: 0.1000 mg | ORAL_TABLET | Freq: Two times a day (BID) | ORAL | 3 refills | Status: DC

## 2021-07-26 MED ORDER — PANTOPRAZOLE SODIUM 40 MG PO TBEC: 40.0000 mg | DELAYED_RELEASE_TABLET | Freq: Every day | ORAL | 11 refills | Status: DC

## 2021-07-26 NOTE — Assessment & Plan Note (Signed)
Her renal function has been much worse in the last year.  We will reduce the chlorthalidone to 12.5 mg.  Her daughter notes that her appetite has been very poor especially since she had COVID-19.  Check renal artery Dopplers.  Repeat basic metabolic panel when she gets her other lab work done.  If her renal function remains abnormal at follow-up, will need referral to nephrology.  Continue losartan.

## 2021-07-26 NOTE — Assessment & Plan Note (Signed)
Continue rosuvastatin.  

## 2021-07-26 NOTE — Assessment & Plan Note (Signed)
[name] pain palpitations when laying in bed at night.  She never gets it during the day.  She also reports having GERD.  She does have a history of SVT and is not on any beta-blockers at this time.  However I suspect that her current symptoms are more attributed to GERD than arrhythmia.  We will start with a 30-day trial of pantoprazole.  If she continues to have symptoms at follow-up we will consider repeating her ambulatory monitor and a beta-blocker.  Thyroid function has been within normal limits.  Check a basic metabolic panel when she comes for her other lab work.

## 2021-07-26 NOTE — Assessment & Plan Note (Signed)
She remains in sinus rhythm.  Continue Eliquis.  She continues to have palpitations consider wearing a monitor at follow-up.  Right now I think her symptoms are more attributable to GERD.

## 2021-07-26 NOTE — Assessment & Plan Note (Signed)
Her blood pressure remains poorly controlled.  It has been in the 160s at home.  She has resistant hypertension given that she is on 3 medications.  We will check renal artery Dopplers.  She will also hold her losartan for 2 days and then get renal and and aldosterone checked.  Given that her renal function seems to be worsening we will reduce her chlorthalidone to 12.5 mg daily.  Add clonidine 0.1 mg twice daily.  She will track her blood pressures and bring back to follow-up in 1 month.

## 2021-07-26 NOTE — Patient Instructions (Addendum)
Medication Instructions:  DECREASE YOUR CHLORTHALIDONE TO 25 MG 1/2 TABLET DAILY   START CLONIDINE 0.1 MG TWICE  A DAY   START PANTOPRAZOLE 40 MG DAILY   *If you need a refill on your cardiac medications before your next appointment, please call your pharmacy*  Lab Work: [place] 2 DAYS PRIOR TO [facility] (RENIN/ALDOSTERONE LEVELS)  If you have labs (blood work) drawn today and your tests are completely normal, you will receive your results only by: MyChart Message (if you have MyChart) OR A paper copy in the mail If you have any lab test that is abnormal or we need to change your treatment, we will call you to review the results.  Testing/Procedures: Your physician has requested that you have a renal artery duplex. During this test, an ultrasound is used to evaluate blood flow to the kidneys. Allow one hour for this exam. Do not eat after midnight the day before and avoid carbonated beverages. Take your medications as you usually do. SCHEDULE AT [facility] REG[facility]   Follow-Up: At [facility], you and your health needs are our priority.  As part of our continuing mission to provide you with exceptional heart care, we have created designated Provider Care Teams.  These Care Teams include your primary Cardiologist (physician) and Advanced Practice Providers (APPs -  Physician Assistants and Nurse Practitioners) who all work together to provide you with the care you need, when you need it.  We recommend signing up for the patient portal called "MyChart".  Sign up information is provided on this After Visit Summary.  MyChart is used to connect with patients for Virtual Visits (Telemedicine).  Patients are able to view lab/test results, encounter notes, upcoming appointments, etc.  Non-urgent messages can be sent to your provider as well.   To learn more about what you can do with MyChart, go to [email or web address].    Your next appointment:     08/19/2021 AT  11:40 AM WITH DR [facility]

## 2021-07-26 NOTE — Progress Notes (Signed)
Cardiology Office Note:    Date:  07/26/2021   ID:  [name], DOB 1941-06-01, MRN [id number]  PCP:  [name], MD  Cardiologist:  [name], MD  Nephrologist:  Referring MD: [name], MD   CC: Hypertension  History of Present Illness:    [name] is a 80 y.o. female with a hx of persistent atrial fibrillation status post ablation, bradycardia, CKD 2, mitral regurgitation, aortic regurgitation, hypertension, and hyperlipidemia here for follow up. She had an echo 05/14/2019 that revealed LVEF 60 to 65% with grade 2 diastolic dysfunction and mildly elevated pulmonary pressures.  She was initially seen 12/2020 to establish care in the hypertension clinic.  She was first diagnosed with hypertension many decades ago.  In the past it was well-controlled.  However in the last year she has struggled.  Lately her blood pressure has been in the 160s at home.  She last saw [name] on 11/22/2019.  Prior to that losartan was increased to 50 mg.  Her blood pressure remained above goal since making that change.  [name] increased losartan to 100mg .  She followed up with [name], NP, and hydralazine 25mg  bid was added on 2/26.  At her first visit hydralazine was increased.  She saw [name], NP on 12/2019 an dit was increased to 75mg .  She started to work out at the [employer].  She was seen in the ED with palpitations 03/2020.  She was in sinus rhythm at the time.  This has resolved.   It was noted that she had inadvertently stopped taking her hydrochlorothiazide. She this was resumed when she saw her pharmacist on 03/2020.  Hydralazine was reduced back to 50 mg twice daily.  HCTZ was changed to chlorthalidone. Her lipids were poorly controlled so she was started to rosuvastatin. Since her last appointment she was found to have a cystic mass in the L adnexa. She presents today for evaluation of palpitations.  Today, she is accompanied by her daughter. Overall, she is  being affected by her palpitations. The palpitation episodes started 3 weeks ago and only occur at night when she is lying down lasting for about an hour. These episodes cause her to lose sleep. She does not experience these palpitations when walking around. Sometimes, she reports getting acid reflux and occasionally takes a medicine that helps with the reflux. At home, her blood pressure readings have been on average 160s/90s. Her husband is also reported to use the same heart monitor and their blood pressure readings may have been mixed up. A few months ago, she was infected with Covid that has resolved. However, her appetite has been diminished since her infection. She tries to exercise but going to the ED made her nervous to continue. However, she participated in workout programs at [facility]. She denies any chest pain or shortness of breath. No lightheadedness, headaches, syncope, orthopnea, or PND. Also has no lower extremity edema.  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 cath 2/09: no CAD, EF 70%   GERD (gastroesophageal reflux disease) 07/26/2021   HYPERLIPIDEMIA    HYPERTENSION    Mild Aortic insufficiency    Moderate mitral regurgitation    Moderate tricuspid  regurgitation    OSTEOPENIA    PAF (paroxysmal atrial fibrillation) ([name])    a. s/p ablation 2013; b. CHADS2VASc -> 4 (HTN, age x 2, female)-->Eliquis.   PAT (paroxysmal atrial tachycardia) ([name])    a. 04/2019 Zio: Occas PACs and rare PVCs. 21 atrial runs - longest 20 beats, max rate 169.   Pneumonia 2009   RA (rheumatoid arthritis) (HCC)    Sinus Bradycardia    a. asymptomatic but prevents use of AVN blocking agents; b. 04/2019 Zio: Avg HR 61 (37-109).    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 05/2019 Echo: EF 60-65%. DD. RVSP 41.39mmHg. Mod dil LA. Mod MR/TR. Mild AI.    Past Surgical History:  Procedure Laterality Date   ABLATION OF DYSRHYTHMIC FOCUS     CARDIAC CATHETERIZATION     COLONOSCOPY  04/18/08   Partial hysterectomy--1979     Thoracentesis   12/20/07      Current Medications: Current Meds  Medication Sig   B Complex Vitamins (VITAMIN B COMPLEX) TABS Take 1 tablet by mouth daily.    chlorthalidone (HYGROTON) 25 MG tablet Take 25 mg by mouth as directed. TAKE 1/2 TABLET DAILY   cholecalciferol (VITAMIN D3) 25 MCG (1000 UNIT) tablet Take 1,000 Units by mouth daily.   cloNIDine (CATAPRES) 0.1 MG tablet Take 1 tablet (0.1 mg total) by mouth 2 (two) times daily.   ELIQUIS 5 MG TABS tablet TAKE 1 TABLET BY MOUTH TWICE DAILY   hydrALAZINE (APRESOLINE) 50 MG tablet TAKE 1 TABLET BY MOUTH 3 TIMES DAILY   Lidocaine 4 % PTCH Place onto the skin.   losartan (COZAAR) 100 MG tablet TAKE 1 TABLET BY MOUTH ONCE DAILY   LUMIGAN 0.01 % SOLN Place into both eyes 2 (two) times daily.   ondansetron (ZOFRAN) 4 MG tablet Take 1-2 tablets (4-8 mg total) by mouth every 8 (eight) hours as needed for nausea or vomiting.   pantoprazole (PROTONIX) 40 MG tablet Take 1 tablet (40 mg total) by mouth daily.   rosuvastatin (CRESTOR) 10 MG tablet Take 1 tablet (10 mg total) by mouth daily.   traMADol (ULTRAM) 50 MG tablet TAKE 1 TABLET BY MOUTH EVERY 8 HOURS AS NEEDED FOR UP TO 5 DAYS FOR SEVERE PAIN   [DISCONTINUED] chlorthalidone (HYGROTON) 25 MG tablet Take 1 tablet (25 mg total) by mouth daily. (Patient taking differently: Take 25 mg by mouth as directed. TAKE 1/2 TABLET DAILY)     Allergies:   Amlodipine, Celecoxib, and Tramadol   Social History   Socioeconomic History   Marital status: Married    Spouse name: Not on file   Number of children: Not on file   Years of education: Not on file   Highest education level: Not on file   Occupational History   Not on file  Tobacco Use   Smoking status: Never   Smokeless tobacco: Never  Vaping Use   Vaping Use: Never used  Substance and Sexual Activity   Alcohol use: No    Alcohol/week: 0.0 standard drinks   Drug use: No   Sexual activity: Never  Other Topics Concern   Not on file  Social History Narrative   Marital Status: widow x 1 yr   Children: 2, grandchildren 19, numerous great grand children   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 Full Code ( reviewed 2015)       Occasional exercise.   Diet: fruits and veggies, lean meats.   Social Determinants of Health   Financial Resource Strain: Not on file  Food Insecurity: Not on file  Transportation Needs: Not on file  Physical Activity: Not on file  Stress: Not on file  Social Connections: Not on file     Family History: The patient's family history includes Atrial fibrillation in her brother; Breast cancer (age of onset: 63) in her paternal aunt; Diabetes in an other family member; Heart attack in her brother; Heart disease in her brother and mother; Pancreatic cancer in her father.  ROS:   Please see the history of present illness.    (+) Palpitations (+) Acid Reflux All other systems reviewed and are negative.  EKGs/Labs/Other Studies Reviewed:    EKG:     07/26/2021: Sinus rhythm Rate 65 03/23/2021: EKG is not ordered today. 11/27/2020: Sinus rhythm.  Rate 60 bpm. 12/06/19: sinus bradycardia.  Rate 53 bpm.  Non-specific ST-T changes.   Echo 05/14/19:  1. The left ventricle has normal systolic function with an ejection  fraction of 60-65%. The cavity size was normal. There is mildly increased  left ventricular wall thickness.  Left ventricular diastolic Doppler  parameters are consistent with  pseudonormalization.   2. The right ventricle has normal systolic function. The cavity was  normal. There is no increase in right ventricular wall thickness. Right  ventricular systolic pressure is mildly elevated with an estimated  pressure of 41.7 mmHg.   3. Left atrial size was moderately dilated.   4. Incompletely evaluated hypoechoic structure noted in the liver, most  likely a cyst.   5. The mitral valve is degenerative. Mild thickening of the mitral valve  leaflet. Mitral valve regurgitation is moderate by color flow Doppler.   6. Tricuspid valve regurgitation is moderate.   7. The aortic valve has an indeterminate number of cusps. Mild thickening  of the aortic valve. Mild calcification of the aortic valve. Aortic valve  regurgitation is mild by color flow Doppler.   8. The aorta is normal in size and structure.   Recent Labs: 11/11/2020: TSH 1.95 05/25/2021: ALT 11; BUN 37; Creatinine, Ser 1.74; Hemoglobin 10.0; Platelets 173; Potassium 3.7; Sodium 137   Recent Lipid Panel    Component Value Date/Time   CHOL 233 (H) 12/22/2020 1345   TRIG 47 12/22/2020 1345   HDL 90 12/22/2020 1345   CHOLHDL 2.6 12/22/2020 1345   VLDL 9 12/22/2020 1345   LDLCALC 134 (H) 12/22/2020 1345   LDLDIRECT 104.4 11/26/2012 0746    Physical Exam:    VS:  BP (!) 142/74 (BP Location: Left Arm, Patient Position: Sitting, Cuff Size: Normal)   Pulse 65   Ht 5\' 3"  (1.6 m)   Wt 142 lb 14.4 oz (64.8 kg)   BMI 25.31 kg/m  , BMI Body mass index is 25.31 kg/m. GENERAL:  Well appearing HEENT: Pupils equal round and reactive, fundi not visualized, oral mucosa unremarkable NECK:  No jugular venous distention, waveform within normal limits, carotid upstroke brisk and symmetric, no bruits LUNGS:  Clear to auscultation bilaterally HEART:  RRR.  PMI not displaced or sustained,S1 and S2 within normal limits, no S3, no S4, no clicks, no rubs,  III/VI systolic murmurs ABD:  Flat, positive bowel sounds normal in frequency in pitch, no bruits, no rebound, no guarding,  no midline pulsatile mass, no hepatomegaly, no splenomegaly EXT:  2 plus pulses throughout, no edema, no cyanosis no clubbing SKIN:  No rashes no nodules NEURO:  Cranial nerves II through XII grossly intact, motor grossly intact throughout PSYCH:  Cognitively intact, oriented to person place and time   ASSESSMENT:    1. Coronary artery disease, non-occlusive   2. Essential hypertension   3. Persistent atrial fibrillation ([place])   4. HYPERLIPIDEMIA   5. Gastroesophageal reflux disease, unspecified whether esophagitis present   6. Stage 3b chronic kidney disease (CKD) ([place])       PLAN:    Essential hypertension Her blood pressure remains poorly controlled.  It has been in the 160s at home.  She has resistant hypertension given that she is on 3 medications.  We will check renal artery Dopplers.  She will also hold her losartan for 2 days and then get renal and and aldosterone checked.  Given that her renal function seems to be worsening we will reduce her chlorthalidone to 12.5 mg daily.  Add clonidine 0.1 mg twice daily.  She will track her blood pressures and bring back to follow-up in 1 month.  Persistent atrial fibrillation She remains in sinus rhythm.  Continue Eliquis.  She continues to have palpitations consider wearing a monitor at follow-up.  Right now I think her symptoms are more attributable to GERD.  HYPERLIPIDEMIA Continue rosuvastatin.   GERD (gastroesophageal reflux disease) Ms. Zehring pain palpitations when laying in bed at night.  She never gets it during the day.  She also reports having GERD.  She does have a history of SVT and is not on any beta-blockers at this time.  However I suspect that her current symptoms are more attributed to GERD than arrhythmia.  We will start with a 30-day trial of pantoprazole.  If she continues to have symptoms at  follow-up we will consider repeating her ambulatory monitor and a beta-blocker.  Thyroid function has been within normal limits.  Check a basic metabolic panel when she comes for her other lab work.  Stage 3b chronic kidney disease (CKD) (HCC) Her renal function has been much worse in the last year.  We will reduce the chlorthalidone to 12.5 mg.  Her daughter notes that her appetite has been very poor especially since she had COVID-19.  Check renal artery Dopplers.  Repeat basic metabolic panel when she gets her other lab work done.  If her renal function remains abnormal at follow-up, will need referral to nephrology.  Continue losartan.    Disposition: FU with Virgle Arth C. Oval Linsey, MD, [facility] in 1 month   Medication Adjustments/Labs and Tests Ordered: Current medicines are reviewed at length with the patient today.  Concerns regarding medicines are outlined above.  Orders Placed This Encounter  Procedures   Aldosterone + renin activity w/ ratio   Basic metabolic panel   EKG 42-ASTM   VAS US RENAL ARTERY DUPLEX    Meds ordered this encounter  Medications   cloNIDine (CATAPRES) 0.1 MG tablet    Sig: Take 1 tablet (0.1 mg total) by mouth 2 (two) times daily.    Dispense:  180 tablet    Refill:  3   pantoprazole (PROTONIX) 40 MG tablet    Sig: Take 1 tablet (40 mg total) by mouth daily.    Dispense:  30 tablet    Refill:  11     I,Mykaella Javier,acting as a scribe for [name], MD.,have documented all relevant documentation on the  behalf of [name], MD,as directed by  [name], MD while in the presence of [name], MD.  I, [place] Oval Linsey, MD have reviewed all documentation for this visit.  The documentation of the exam, diagnosis, procedures, and orders on 07/26/2021 are all accurate and complete.   Signed, [name], MD  07/26/2021 5:31 PM    [place]

## 2021-07-28 ENCOUNTER — Encounter (HOSPITAL_BASED_OUTPATIENT_CLINIC_OR_DEPARTMENT_OTHER): Payer: Self-pay | Admitting: Obstetrics and Gynecology

## 2021-07-28 ENCOUNTER — Emergency Department (HOSPITAL_BASED_OUTPATIENT_CLINIC_OR_DEPARTMENT_OTHER): Payer: Medicare Other | Admitting: Radiology

## 2021-07-28 ENCOUNTER — Other Ambulatory Visit: Payer: Self-pay

## 2021-07-28 DIAGNOSIS — I129 Hypertensive chronic kidney disease with stage 1 through stage 4 chronic kidney disease, or unspecified chronic kidney disease: Secondary | ICD-10-CM | POA: Diagnosis not present

## 2021-07-28 DIAGNOSIS — I2510 Atherosclerotic heart disease of native coronary artery without angina pectoris: Secondary | ICD-10-CM | POA: Diagnosis not present

## 2021-07-28 DIAGNOSIS — R0602 Shortness of breath: Secondary | ICD-10-CM | POA: Insufficient documentation

## 2021-07-28 DIAGNOSIS — N1832 Chronic kidney disease, stage 3b: Secondary | ICD-10-CM | POA: Insufficient documentation

## 2021-07-28 DIAGNOSIS — R079 Chest pain, unspecified: Secondary | ICD-10-CM | POA: Diagnosis not present

## 2021-07-28 DIAGNOSIS — Z8616 Personal history of COVID-19: Secondary | ICD-10-CM | POA: Insufficient documentation

## 2021-07-28 DIAGNOSIS — R072 Precordial pain: Secondary | ICD-10-CM | POA: Insufficient documentation

## 2021-07-28 LAB — CBC
HCT: 28.6 % — ABNORMAL LOW (ref 36.0–46.0)
Hemoglobin: 9.5 g/dL — ABNORMAL LOW (ref 12.0–15.0)
MCH: 31.1 pg (ref 26.0–34.0)
MCHC: 33.2 g/dL (ref 30.0–36.0)
MCV: 93.8 fL (ref 80.0–100.0)
Platelets: 179 10*3/uL (ref 150–400)
RBC: 3.05 MIL/uL — ABNORMAL LOW (ref 3.87–5.11)
RDW: 12.3 % (ref 11.5–15.5)
WBC: 4.5 10*3/uL (ref 4.0–10.5)
nRBC: 0 % (ref 0.0–0.2)

## 2021-07-28 LAB — BASIC METABOLIC PANEL
Anion gap: 9 (ref 5–15)
BUN: 43 mg/dL — ABNORMAL HIGH (ref 8–23)
CO2: 23 mmol/L (ref 22–32)
Calcium: 9.9 mg/dL (ref 8.9–10.3)
Chloride: 105 mmol/L (ref 98–111)
Creatinine, Ser: 1.78 mg/dL — ABNORMAL HIGH (ref 0.44–1.00)
GFR, Estimated: 29 mL/min — ABNORMAL LOW (ref >60)
Glucose, Bld: 116 mg/dL — ABNORMAL HIGH (ref 70–99)
Potassium: 3.7 mmol/L (ref 3.5–5.1)
Sodium: 137 mmol/L (ref 135–145)

## 2021-07-28 LAB — TROPONIN I (HIGH SENSITIVITY): Troponin I (High Sensitivity): 4 ng/L (ref <18)

## 2021-07-28 IMAGING — DX DG CHEST 2V
2 series · 2 of 2 positions shown · non-contrast
Comparison: [DATE]

CLINICAL DATA: Chest pain

EXAM:
CHEST - 2 VIEW

[chest pa]
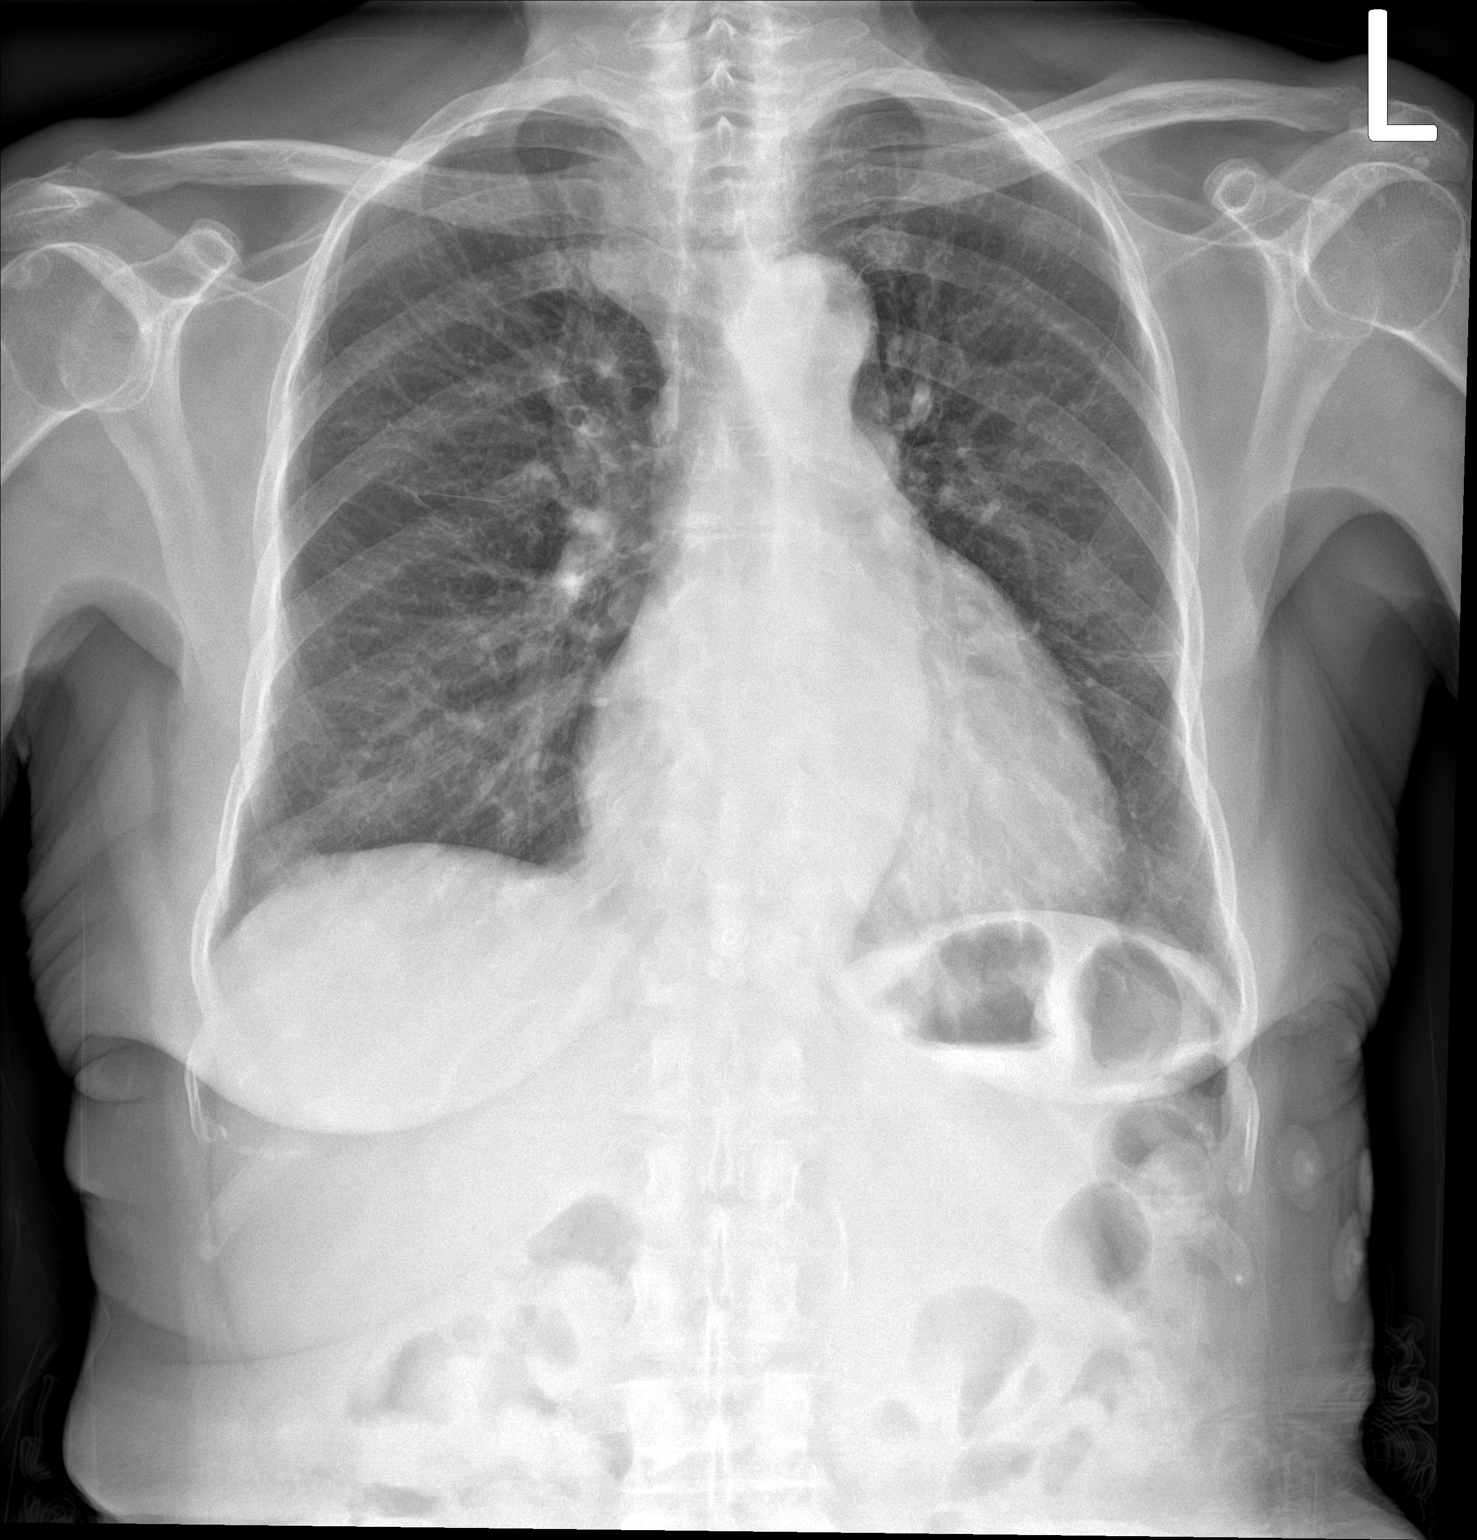

[chest lat]
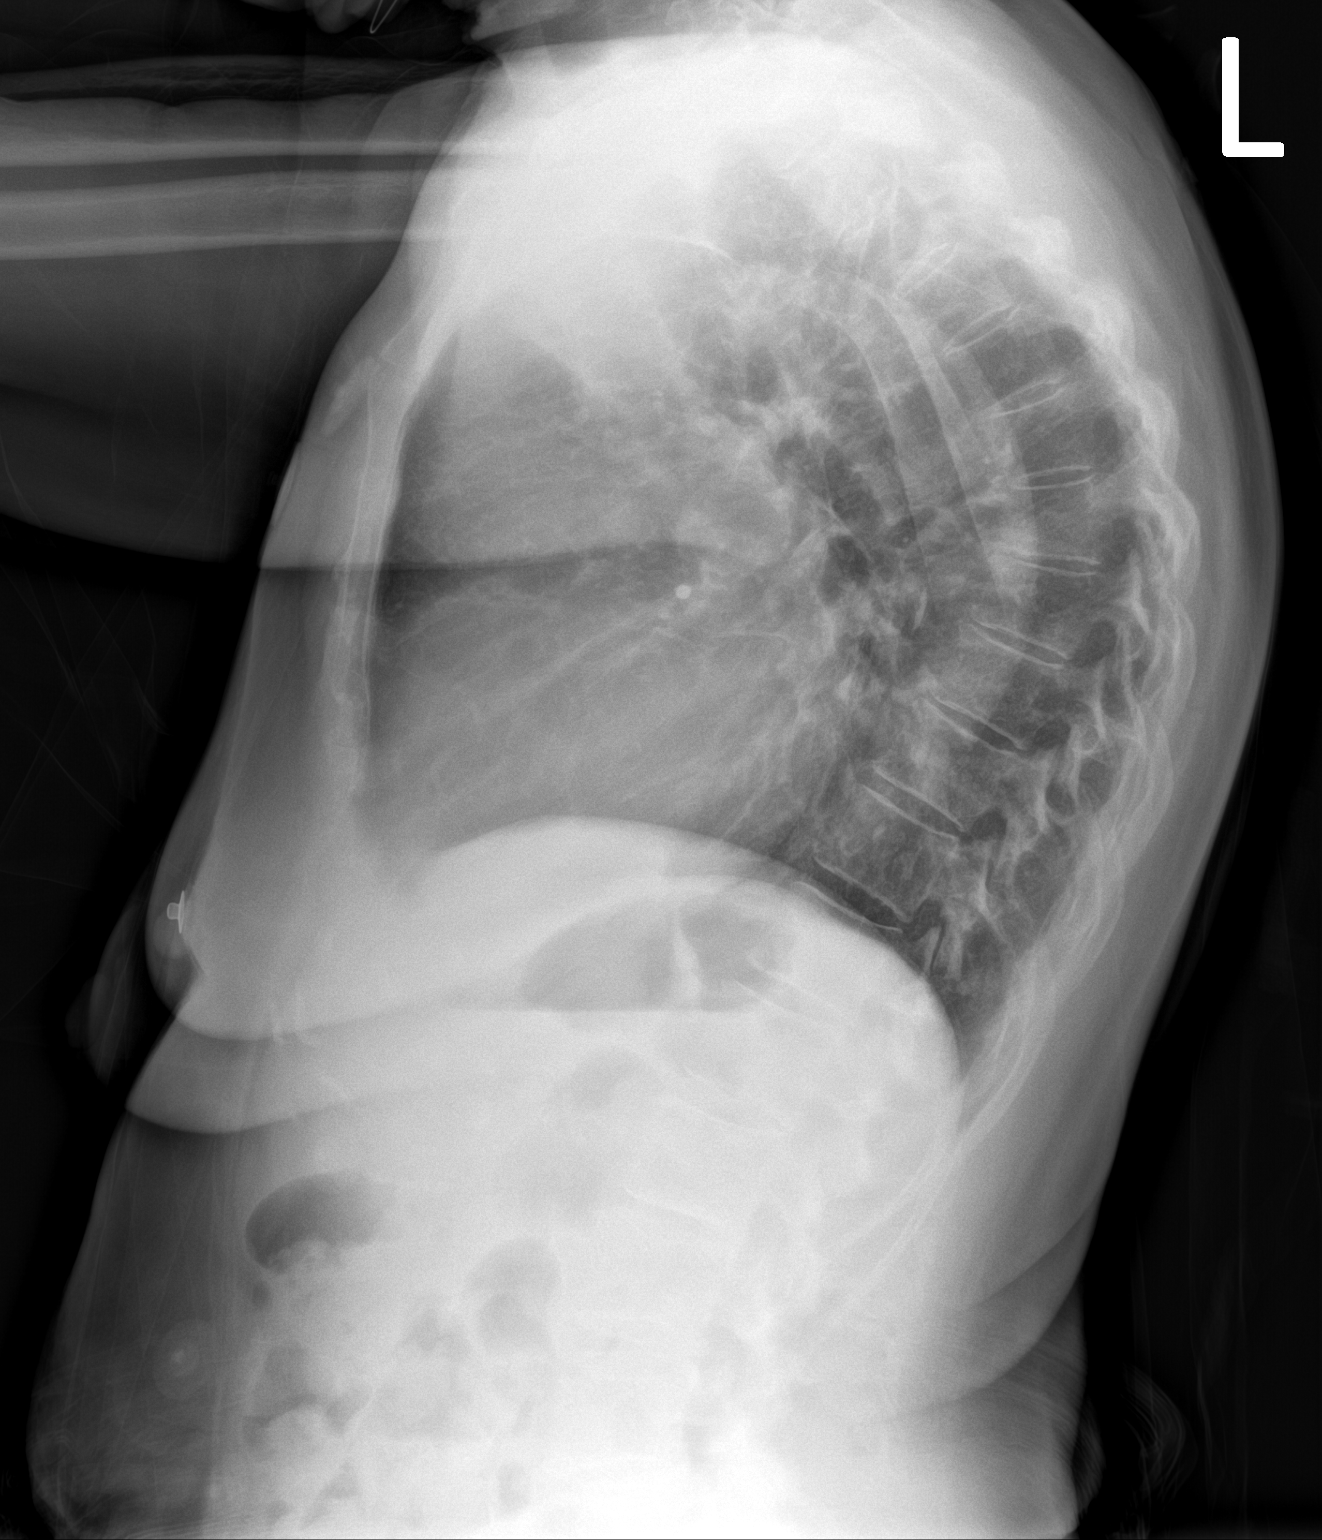

[2 of 2 positions shown; findings below may reference images not displayed]

FINDINGS: Heart and mediastinal contours are within normal limits. No focal
opacities or effusions. No acute bony abnormality.
IMPRESSION: No active cardiopulmonary disease.

## 2021-07-28 NOTE — ED Triage Notes (Signed)
Patient reports to the ER for chest pain that she originally thought was GERD. Patient reports she called EMS to her house and an EKG was performed. Patient reports ShOB as well. Patient denies Nausea/Emesis or diarrhea.

## 2021-07-29 ENCOUNTER — Telehealth (HOSPITAL_BASED_OUTPATIENT_CLINIC_OR_DEPARTMENT_OTHER): Payer: Self-pay | Admitting: *Deleted

## 2021-07-29 ENCOUNTER — Emergency Department (HOSPITAL_BASED_OUTPATIENT_CLINIC_OR_DEPARTMENT_OTHER)
Admission: EM | Admit: 2021-07-29 | Discharge: 2021-07-29 | Disposition: A | Discharge status: Home / Self Care | Payer: Medicare Other | Attending: Emergency Medicine | Admitting: Emergency Medicine

## 2021-07-29 DIAGNOSIS — R079 Chest pain, unspecified: Secondary | ICD-10-CM

## 2021-07-29 DIAGNOSIS — R0602 Shortness of breath: Secondary | ICD-10-CM

## 2021-07-29 LAB — TROPONIN I (HIGH SENSITIVITY): Troponin I (High Sensitivity): 4 ng/L (ref <18)

## 2021-07-29 MED ORDER — CHLORTHALIDONE 25 MG PO TABS: ORAL_TABLET | ORAL | 3 refills | Status: DC

## 2021-07-29 NOTE — ED Provider Notes (Signed)
[facility] EMERGENCY DEPT Provider Note   CSN: [id number] Arrival date & time: 07/28/21  2230     History Chief Complaint  Patient presents with   Chest Pain    [name] is a 80 y.o. female.   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 cath 2/09: no CAD, EF 70%   GERD (gastroesophageal reflux disease) 07/26/2021   HYPERLIPIDEMIA    HYPERTENSION    Mild Aortic insufficiency    Moderate mitral regurgitation    Moderate tricuspid regurgitation    OSTEOPENIA    PAF (paroxysmal atrial fibrillation) ([place])    a. s/p ablation 2013; b. CHADS2VASc -> 4 (HTN, age x 2, female)-->Eliquis.   PAT (paroxysmal atrial tachycardia) ([place])    a. 04/2019 Zio: Occas PACs and rare PVCs. 21 atrial runs - longest 20 beats, max rate 169.   Pneumonia 2009   RA (rheumatoid arthritis) (HCC)    Sinus Bradycardia    a. asymptomatic but prevents use of AVN blocking agents; b. 04/2019 Zio: Avg HR 61 (37-109).   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 05/2019 Echo: EF 60-65%. DD. RVSP 41.27mmHg. Mod dil LA. Mod MR/TR. Mild AI.    Patient Active Problem List   Diagnosis Date Noted   GERD (gastroesophageal reflux disease) 07/26/2021   Acute pain of left hip 04/20/2021   COVID-19 virus infection 04/08/2021   Diarrhea of presumed infectious origin 12/03/2020   Memory loss 11/10/2020   Urinary frequency 11/10/2020   Sinus bradycardia 07/18/2019   Valvular heart disease 07/18/2019   PSVT  (paroxysmal supraventricular tachycardia) ([place]) 05/16/2019   Blistering rash 02/15/2019   Persistent atrial fibrillation ([place]) 07/19/2018   Peripheral edema 02/02/2018   Acute nonintractable headache 02/02/2018   Coronary artery disease, non-occlusive 09/14/2017   Status post ablation of atrial fibrillation 09/14/2017   Essential hypertension 09/14/2017   Tinea pedis 07/14/2017   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/15/2016   Stage 3b chronic kidney disease (CKD) ([place]) 03/14/2016   History of rheumatoid arthritis 03/14/2016   Mitral regurgitation 12/30/2015   Obesity 12/02/2015   Aortic valve insufficiency 12/02/2015   Vitamin D deficiency 04/17/2015   Counseling regarding end of life decision making 04/17/2015   Allergic rhinitis 02/03/2015   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     Family History  Problem Relation Age of Onset   Diabetes Other    Breast cancer Paternal Aunt 61   Heart disease Mother    Pancreatic cancer Father  Atrial fibrillation Brother    Heart disease Brother    Heart attack Brother     Social History   Tobacco Use   Smoking status: Never   Smokeless tobacco: Never  Vaping Use   Vaping Use: Never used  Substance Use Topics   Alcohol use: No    Alcohol/week: 0.0 standard drinks   Drug use: No    Home Medications Prior to Admission medications   Medication Sig Start Date End Date Taking? Authorizing Provider  B Complex Vitamins (VITAMIN B COMPLEX) TABS Take 1 tablet by mouth daily.     [provider]  chlorthalidone  (HYGROTON) 25 MG tablet Take 25 mg by mouth as directed. TAKE 1/2 TABLET DAILY    [provider]  cholecalciferol (VITAMIN D3) 25 MCG (1000 UNIT) tablet Take 1,000 Units by mouth daily.    [provider]  cloNIDine (CATAPRES) 0.1 MG tablet Take 1 tablet (0.1 mg total) by mouth 2 (two) times daily. 07/26/21   [name], MD  ELIQUIS 5 MG TABS tablet TAKE 1 TABLET BY MOUTH TWICE DAILY 02/18/21   End, Harrell Gave, MD  hydrALAZINE (APRESOLINE) 50 MG tablet TAKE 1 TABLET BY MOUTH 3 TIMES DAILY 05/10/21   [name], MD  Lidocaine 4 % PTCH Place onto the skin. 04/10/21   [provider]  losartan (COZAAR) 100 MG tablet TAKE 1 TABLET BY MOUTH ONCE DAILY 04/15/21   [name], MD  LUMIGAN 0.01 % SOLN Place into both eyes 2 (two) times daily. 05/28/20   [provider]  ondansetron (ZOFRAN) 4 MG tablet TAKE 1-2 TABLETS BY MOUTH EVERY 8 HOURS AS NEEDED FOR NAUSEA OR VOMITING 07/28/21   [name], NP  pantoprazole (PROTONIX) 40 MG tablet Take 1 tablet (40 mg total) by mouth daily. 07/26/21   [name], MD  rosuvastatin (CRESTOR) 10 MG tablet Take 1 tablet (10 mg total) by mouth daily. 01/14/21 04/27/22  [name], MD  traMADol (ULTRAM) 50 MG tablet TAKE 1 TABLET BY MOUTH EVERY 8 HOURS AS NEEDED FOR UP TO 5 DAYS FOR SEVERE PAIN 07/23/21   [name], MD    Allergies    Amlodipine, Celecoxib, and Tramadol  Review of Systems   Review of Systems  HENT:  Negative for trouble swallowing.   Cardiovascular:  Positive for chest pain.  Gastrointestinal:  Negative for abdominal pain.  Neurological:  Positive for dizziness.  All other systems reviewed and are negative.  Physical Exam Updated Vital Signs BP 128/66 (BP Location: Right Arm)   Pulse 62   Temp 97.9 F (36.6 C) (Oral)   Resp 16   Ht 5\' 3"  (1.6 m)   Wt 64.8 kg   SpO2 98%   BMI 25.31 kg/m   Physical Exam Vitals and nursing note reviewed.  Constitutional:       Appearance: She is well-developed.  HENT:     Head: Normocephalic and atraumatic.  Cardiovascular:     Rate and Rhythm: Normal rate and regular rhythm.  Pulmonary:     Effort: Pulmonary effort is normal. No respiratory distress.     Breath sounds: No stridor.  Chest:     Chest wall: No mass or tenderness.  Abdominal:     General: There is no distension.     Palpations: Abdomen is soft.  Musculoskeletal:        General: Normal range of motion.     Cervical back: Normal range of motion.  Skin:    General:  Skin is warm and dry.  Neurological:     Mental Status: She is alert.    ED Results / Procedures / Treatments   Labs (all labs ordered are listed, but only abnormal results are displayed) Labs Reviewed  BASIC METABOLIC PANEL - Abnormal; Notable for the following components:      Result Value   Glucose, Bld 116 (*)    BUN 43 (*)    Creatinine, Ser 1.78 (*)    GFR, Estimated 29 (*)    All other components within normal limits  CBC - Abnormal; Notable for the following components:   RBC 3.05 (*)    Hemoglobin 9.5 (*)    HCT 28.6 (*)    All other components within normal limits  TROPONIN I (HIGH SENSITIVITY)  TROPONIN I (HIGH SENSITIVITY)    EKG EKG Interpretation  Date/Time:  Wednesday July 28 2021 22:43:20 EDT Ventricular Rate:  51 PR Interval:  150 QRS Duration: 78 QT Interval:  438 QTC Calculation: 403 R Axis:   66 Text Interpretation: Sinus bradycardia Otherwise normal ECG Confirmed by Merrily Pew [phone]) on 07/28/2021 10:56:49 PM  Radiology DG Chest 2 View  Result Date: 07/28/2021 CLINICAL DATA:  Chest pain EXAM: CHEST - 2 VIEW COMPARISON:  03/18/2020 FINDINGS: Heart and mediastinal contours are within normal limits. No focal opacities or effusions. No acute bony abnormality. IMPRESSION: No active cardiopulmonary disease. Electronically Signed   By: Rolm Baptise M.D.   On: 07/28/2021 23:10    Procedures Procedures   Medications Ordered in  ED Medications - No data to display  ED Course  I have reviewed the triage vital signs and the nursing notes.  Pertinent labs & imaging results that were available during my care of the patient were reviewed by me and considered in my medical decision making (see chart for details).    MDM Rules/Calculators/A&P                         Chest pain of unclear etiology. Has h/o CAD, will continue follo wup with Dr. Oval Linsey.  Suspect her dizziness/lightheadedness could be from overly treating HTN as she was supposed to half her chlorthalidone when startign the clonidine but hasn't. I suggested oing that and if there were still a problem, maybe holdign the clonidine as that could be side effects from those meds.   Final Clinical Impression(s) / ED Diagnoses Final diagnoses:  Nonspecific chest pain  SOB (shortness of breath)    Rx / DC Orders ED Discharge Orders     None        Chantry Headen, Corene Cornea, MD 07/29/21 [phone]

## 2021-07-29 NOTE — Telephone Encounter (Signed)
Patient was seen by Dr Oval Linsey 10/17 with medication changes of decreasing Chlorthalidone to 25 mg 1/2 tablet daily  No updated prescription sent to pharmacy at visit Typed out on AVS,, changed on medication list, and verbally given to patient.  Updated Rx sent to [facility]

## 2021-08-02 ENCOUNTER — Other Ambulatory Visit: Payer: Self-pay

## 2021-08-02 ENCOUNTER — Telehealth: Payer: Self-pay

## 2021-08-02 ENCOUNTER — Encounter: Payer: Self-pay | Admitting: Nurse Practitioner

## 2021-08-02 ENCOUNTER — Inpatient Hospital Stay: Payer: Medicare Other | Attending: Oncology

## 2021-08-02 ENCOUNTER — Inpatient Hospital Stay (HOSPITAL_BASED_OUTPATIENT_CLINIC_OR_DEPARTMENT_OTHER): Payer: Medicare Other | Admitting: Nurse Practitioner

## 2021-08-02 VITALS — BP 138/72 | HR 40 | Temp 96.2°F | Resp 16 | Wt 142.5 lb

## 2021-08-02 DIAGNOSIS — K219 Gastro-esophageal reflux disease without esophagitis: Secondary | ICD-10-CM | POA: Diagnosis not present

## 2021-08-02 DIAGNOSIS — R001 Bradycardia, unspecified: Secondary | ICD-10-CM | POA: Diagnosis not present

## 2021-08-02 DIAGNOSIS — M8580 Other specified disorders of bone density and structure, unspecified site: Secondary | ICD-10-CM | POA: Insufficient documentation

## 2021-08-02 DIAGNOSIS — I4819 Other persistent atrial fibrillation: Secondary | ICD-10-CM | POA: Diagnosis not present

## 2021-08-02 DIAGNOSIS — N1832 Chronic kidney disease, stage 3b: Secondary | ICD-10-CM | POA: Insufficient documentation

## 2021-08-02 DIAGNOSIS — D3912 Neoplasm of uncertain behavior of left ovary: Secondary | ICD-10-CM | POA: Insufficient documentation

## 2021-08-02 DIAGNOSIS — I129 Hypertensive chronic kidney disease with stage 1 through stage 4 chronic kidney disease, or unspecified chronic kidney disease: Secondary | ICD-10-CM | POA: Diagnosis not present

## 2021-08-02 DIAGNOSIS — E559 Vitamin D deficiency, unspecified: Secondary | ICD-10-CM | POA: Insufficient documentation

## 2021-08-02 DIAGNOSIS — M069 Rheumatoid arthritis, unspecified: Secondary | ICD-10-CM | POA: Insufficient documentation

## 2021-08-02 DIAGNOSIS — I471 Supraventricular tachycardia: Secondary | ICD-10-CM | POA: Diagnosis not present

## 2021-08-02 DIAGNOSIS — D631 Anemia in chronic kidney disease: Secondary | ICD-10-CM | POA: Diagnosis not present

## 2021-08-02 DIAGNOSIS — E785 Hyperlipidemia, unspecified: Secondary | ICD-10-CM | POA: Diagnosis not present

## 2021-08-02 DIAGNOSIS — D649 Anemia, unspecified: Secondary | ICD-10-CM

## 2021-08-02 LAB — IRON AND TIBC
Iron: 75 ug/dL (ref 28–170)
Saturation Ratios: 25 % (ref 10.4–31.8)
TIBC: 297 ug/dL (ref 250–450)
UIBC: 222 ug/dL

## 2021-08-02 LAB — CBC
HCT: 28.1 % — ABNORMAL LOW (ref 36.0–46.0)
Hemoglobin: 9.6 g/dL — ABNORMAL LOW (ref 12.0–15.0)
MCH: 31.8 pg (ref 26.0–34.0)
MCHC: 34.2 g/dL (ref 30.0–36.0)
MCV: 93 fL (ref 80.0–100.0)
Platelets: 169 10*3/uL (ref 150–400)
RBC: 3.02 MIL/uL — ABNORMAL LOW (ref 3.87–5.11)
RDW: 12.2 % (ref 11.5–15.5)
WBC: 4.2 10*3/uL (ref 4.0–10.5)
nRBC: 0 % (ref 0.0–0.2)

## 2021-08-02 LAB — FOLATE: Folate: 25 ng/mL (ref >5.9)

## 2021-08-02 LAB — VITAMIN B12: Vitamin B-12: 710 pg/mL (ref 180–914)

## 2021-08-02 LAB — FERRITIN: Ferritin: 133 ng/mL (ref 11–307)

## 2021-08-02 NOTE — Progress Notes (Signed)
Hematology/Oncology Consult Note [facility]  Telephone:([phone] [phone]  Patient Care Team: [name], MD as PCP - General [name], MD as PCP - Cardiology (Cardiology) Birder Robson, MD as Referring Physician (Ophthalmology)   Name of the patient: [name]  [id number]  1941-06-26   Date of visit: 08/02/21  Diagnosis-normocytic anemia likely multifactorial secondary to chronic disease and anemia of chronic kidney disease  Chief complaint/ Reason for visit-follow-up for Anemia of CKD  Heme/Onc history: patient is a 80 year old female with a past medical history significant for atrial fibrillation, paroxysmal supraventricular tachycardia, hypertension, coronary artery disease referred for anemia.  Her most recent CBC with differential from 11/11/2020 showed a white count of 5.4, H&H of 10.6/32.1 with an MCV of 92 and a platelet count of 183.  Looking back at her prior CBCs patient has always had a normal white count and a platelet count and her hemoglobin has typically remained between 10-11 since 2009.  Patient recently had iron studies, TSH and B12 checked on 11/11/2020 which was within normal limits.    Interval history-patient is 80 year old female who returns to clinic for labs, further evaluation.  She is being referred to GYN oncology for evaluation of left adnexal complex cystic mass.  She had pelvic ultrasound in August.  Denies pelvic pain.  No vaginal bleeding.  She was seen in the ER on 07/29/2021 for chest pain with associated dizziness.  She is awaiting follow-up appointment with her cardiologist in early November.  She was taking twice the dose of losartan as prescribed.  ER requested that she hold medication and she continues to do so.  She monitors her blood pressure at home.  Otherwise she feels at baseline and denies other specific complaints.  ECOG PS- 1 Pain scale- 0  Review of systems- Review of Systems   Constitutional:  Positive for malaise/fatigue. Negative for chills, fever and weight loss.  HENT:  Negative for hearing loss, nosebleeds, sore throat and tinnitus.   Eyes:  Negative for blurred vision and double vision.  Respiratory:  Negative for cough, hemoptysis, shortness of breath and wheezing.   Cardiovascular:  Negative for chest pain, palpitations and leg swelling.       Low heart rate  Gastrointestinal:  Negative for abdominal pain, blood in stool, constipation, diarrhea, melena, nausea and vomiting.  Genitourinary:  Negative for dysuria and urgency.  Musculoskeletal:  Negative for back pain, falls, joint pain and myalgias.  Skin:  Negative for itching and rash.  Neurological:  Positive for dizziness. Negative for tingling, sensory change, loss of consciousness, weakness and headaches.  Endo/Heme/Allergies:  Negative for environmental allergies. Does not bruise/bleed easily.  Psychiatric/Behavioral:  Negative for depression. The patient is not nervous/anxious and does not have insomnia.       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 cath 2/09: no CAD, EF 70%   GERD (gastroesophageal reflux disease) 07/26/2021   HYPERLIPIDEMIA    HYPERTENSION    Mild Aortic insufficiency    Moderate mitral regurgitation    Moderate tricuspid regurgitation    OSTEOPENIA    PAF (paroxysmal atrial fibrillation) ([place])  a. s/p ablation 2013; b. CHADS2VASc -> 4 (HTN, age x 2, female)-->Eliquis.   PAT (paroxysmal atrial tachycardia) ([place])    a. 04/2019 Zio: Occas PACs and rare PVCs. 21 atrial runs - longest 20 beats, max rate 169.   Pneumonia 2009   RA (rheumatoid arthritis) ([facility])    Sinus  Bradycardia    a. asymptomatic but prevents use of AVN blocking agents; b. 04/2019 Zio: Avg HR 61 (37-109).   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 05/2019 Echo: EF 60-65%. DD. RVSP 41.60mmHg. Mod dil LA. Mod MR/TR. Mild AI.     Past Surgical History:  Procedure Laterality Date   ABLATION OF DYSRHYTHMIC FOCUS     CARDIAC CATHETERIZATION     COLONOSCOPY  04/18/08   Partial hysterectomy--1979     Thoracentesis   12/20/07      Social History   Socioeconomic History   Marital status: Married    Spouse name: Not on file   Number of children: Not on file   Years of education: Not on file   Highest education level: Not on file  Occupational History   Not on file  Tobacco Use   Smoking status: Never   Smokeless tobacco: Never  Vaping Use   Vaping Use: Never used  Substance and Sexual Activity   Alcohol use: No    Alcohol/week: 0.0 standard drinks   Drug use: No   Sexual activity: Never  Other Topics Concern   Not on file  Social History Narrative   Marital Status: widow x 1 yr   Children: 69, grandchildren 70, numerous great grand children   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 Full Code ( reviewed 2015)       Occasional exercise.   Diet: fruits and veggies, lean meats.   Social Determinants of Health   Financial Resource Strain: Not on file  Food Insecurity: Not on file  Transportation Needs: Not on file  Physical Activity: Not on file  Stress: Not on file  Social Connections: Not on file  Intimate Partner Violence: Not on file    Family History  Problem Relation Age of Onset   Diabetes Other    Breast cancer Paternal Aunt 30   Heart disease Mother    Pancreatic cancer  Father    Atrial fibrillation Brother    Heart disease Brother    Heart attack Brother      Current Outpatient Medications:    B Complex Vitamins (VITAMIN B COMPLEX) TABS, Take 1 tablet by mouth daily. , Disp: , Rfl:    chlorthalidone (HYGROTON) 25 MG tablet, TAKE 1/2 TABLET DAILY, Disp: 45 tablet, Rfl: 3   cholecalciferol (VITAMIN D3) 25 MCG (1000 UNIT) tablet, Take 1,000 Units by mouth daily., Disp: , Rfl:    cloNIDine (CATAPRES) 0.1 MG tablet, Take 1 tablet (0.1 mg total) by mouth 2 (two) times daily., Disp: 180 tablet, Rfl: 3   ELIQUIS 5 MG TABS tablet, TAKE 1 TABLET BY MOUTH TWICE DAILY, Disp: 180 tablet, Rfl: 1   hydrALAZINE (APRESOLINE) 50 MG tablet, TAKE 1 TABLET BY MOUTH 3 TIMES DAILY, Disp: 90 tablet, Rfl: 2   Lidocaine 4 % PTCH, Place onto the skin., Disp: , Rfl:    LUMIGAN 0.01 % SOLN,  Place into both eyes 2 (two) times daily., Disp: , Rfl:    ondansetron (ZOFRAN) 4 MG tablet, TAKE 1-2 TABLETS BY MOUTH EVERY 8 HOURS AS NEEDED FOR NAUSEA OR VOMITING, Disp: 20 tablet, Rfl: 0   pantoprazole (PROTONIX) 40 MG tablet, Take 1 tablet (40 mg total) by mouth daily., Disp: 30 tablet, Rfl: 11   rosuvastatin (CRESTOR) 10 MG tablet, Take 1 tablet (10 mg total) by mouth daily., Disp: 90 tablet, Rfl: 3   traMADol (ULTRAM) 50 MG tablet, TAKE 1 TABLET BY MOUTH EVERY 8 HOURS AS NEEDED FOR UP TO 5 DAYS FOR SEVERE PAIN, Disp: 15 tablet, Rfl: 0   losartan (COZAAR) 100 MG tablet, TAKE 1 TABLET BY MOUTH ONCE DAILY (Patient not taking: Reported on 08/02/2021), Disp: 90 tablet, Rfl: 2  Physical exam:  Vitals:   08/02/21 1024  BP: 138/72  Pulse: (!) 40  Resp: 16  Temp: (!) 96.2 F (35.7 C)  SpO2: 99%  Weight: 142 lb 8 oz (64.6 kg)   Physical Exam Vitals reviewed.  Constitutional:      Appearance: Normal appearance. She is not ill-appearing.     Comments: Appears stated age.  Accompanied by husband.  HENT:     Head: Normocephalic and atraumatic.  Cardiovascular:     Rate and Rhythm: Regular  rhythm. Bradycardia present.     Heart sounds: No murmur heard. Pulmonary:     Effort: Pulmonary effort is normal.     Breath sounds: Normal breath sounds. No wheezing.  Abdominal:     Palpations: Abdomen is soft.     Tenderness: There is no abdominal tenderness.  Musculoskeletal:        General: Normal range of motion.     Cervical back: Normal range of motion.  Skin:    General: Skin is warm and dry.     Findings: No rash.  Neurological:     Mental Status: She is alert and oriented to person, place, and time. Mental status is at baseline.  Psychiatric:        Mood and Affect: Mood normal.        Behavior: Behavior normal.     CMP Latest Ref Rng & Units 07/28/2021  Glucose 70 - 99 mg/dL 116(H)  BUN 8 - 23 mg/dL 43(H)  Creatinine 0.44 - 1.00 mg/dL 1.78(H)  Sodium 135 - 145 mmol/L 137  Potassium 3.5 - 5.1 mmol/L 3.7  Chloride 98 - 111 mmol/L 105  CO2 22 - 32 mmol/L 23  Calcium 8.9 - 10.3 mg/dL 9.9  Total Protein 6.5 - 8.1 g/dL -  Total Bilirubin 0.3 - 1.2 mg/dL -  Alkaline Phos 38 - 126 U/L -  AST 15 - 41 U/L -  ALT 0 - 44 U/L -   CBC Latest Ref Rng & Units 08/02/2021  WBC 4.0 - 10.5 K/uL 4.2  Hemoglobin 12.0 - 15.0 g/dL 9.6(L)  Hematocrit 36.0 - 46.0 % 28.1(L)  Platelets 150 - 400 K/uL 169   Iron/TIBC/Ferritin/ %Sat    Component Value Date/Time   IRON 75 08/02/2021 0959   TIBC 297 08/02/2021 0959   FERRITIN 133 08/02/2021 0959   IRONPCTSAT 25 08/02/2021 0959    DG Chest 2 View  Result Date: 07/28/2021 CLINICAL DATA:  Chest pain EXAM: CHEST - 2 VIEW COMPARISON:  03/18/2020 FINDINGS: Heart and mediastinal contours are within normal limits. No focal opacities or effusions. No acute bony abnormality. IMPRESSION: No active cardiopulmonary disease. Electronically Signed   By: Rolm Baptise M.D.  On: 07/28/2021 23:10    I independently reviewed below imaging and agree with findings.  06/09/21- US Pelvic Complete Uterus- Surgically absent Endometrium- Surgically  absent Right ovary-  Not visualized, question obscured by bowel Left ovary- No LEFT normal ovary visualized, see below Other findings- LEFT adnexal complex cystic mass 5.2 x 1.7 x 1.8 cm containing scattered low level diffuse internal echogenicity and a mural nodule which measures 5 x 5 x 5 mm. No blood flow seen within the mural nodule on color Doppler imaging. Lesion corresponds to that seen on MR, including presence of the small mural nodule. No free pelvic fluid. No additional pelvic masses.    Assessment and plan- Patient is a 80 y.o. 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 Awaiting appt with nephrology. Initial work-up showed negative SPEP, normal LDH, reticulocyte count, normal haptoglobin, ferritin and iron studies were normal, B1 and B6 levels were normal. Hemoglobin between 9-10 but has down trending into 9s over the past year.  Ferritin is normal.  Folate normal.  Iron saturation 25%.  Vitamin B12 is normal. Suspect she would benefit from starting EPO injections and optimize iron in the future. We briefly reviewed role of EPO and IV iron in CKD today. Clinically recommend addressing after she has seen Cardiology (see below). Will authorize EPO so that this can be offered at next visit.  Bradycardia- heart rate has decreased to 40s. Historically, 50s-60s. She is symptomatic intermittently. Not on rate controlling medications. I advised her to continue to hold her losartan and contact her cardiologist to see her sooner.  Weight loss- post covid. Now stable.  Hip pain- managed by ortho.  Left adnexal cystic mass- awaiting appt with gyn onc  Disposition: 2 months- labs, follow up with Dr. Janese Banks, +/- Retacrit  Visit Diagnosis 1. Anemia of chronic renal failure, stage 3b ([place])   2. Bradycardia    Beckey Rutter, DNP, AGNP-C [place] [facility] [phone] (clinic) 08/02/2021   CC: Dr. Oval Linsey (cardiology)

## 2021-08-02 NOTE — Telephone Encounter (Signed)
Msg sent to [facility] for assistance.   Awaiting response

## 2021-08-02 NOTE — Telephone Encounter (Signed)
Referral received for gyn oncology to evaluate adnexal mass. Records reviewed and appointment being arranged for 08/04/21 with Dr. Fransisca Connors.

## 2021-08-02 NOTE — Progress Notes (Signed)
Patient was evaluated by walk in clinic for dizziness.  It was found that she was taking Losartan incorrectly.  She was taking a whole tab when was suppose to be taking 1/2 tab.  Was advised to hold Losartan for 2 days and has not restarted back today.  Right shoulder for the past 1-2 weeks (8/10 pain scale).

## 2021-08-03 ENCOUNTER — Telehealth (HOSPITAL_BASED_OUTPATIENT_CLINIC_OR_DEPARTMENT_OTHER): Payer: Self-pay | Admitting: *Deleted

## 2021-08-03 ENCOUNTER — Encounter: Payer: Self-pay | Admitting: Oncology

## 2021-08-03 DIAGNOSIS — D631 Anemia in chronic kidney disease: Secondary | ICD-10-CM | POA: Insufficient documentation

## 2021-08-03 DIAGNOSIS — N1832 Chronic kidney disease, stage 3b: Secondary | ICD-10-CM | POA: Insufficient documentation

## 2021-08-03 DIAGNOSIS — I10 Essential (primary) hypertension: Secondary | ICD-10-CM

## 2021-08-03 MED ORDER — HYDRALAZINE HCL 50 MG PO TABS: 50.0000 mg | ORAL_TABLET | Freq: Two times a day (BID) | ORAL | 2 refills | Status: DC

## 2021-08-03 MED ORDER — CLONIDINE HCL 0.1 MG PO TABS: ORAL_TABLET | ORAL | 3 refills | Status: DC

## 2021-08-03 NOTE — Telephone Encounter (Signed)
Patients blood pressure and HR running low since seen last week  10/19 blood pressure 102/40 felt dizzy and chest pain  Went to ED for evaluation, CP thought to be reflux  Today's blood pressure readings prior to medications below  100/56 HR 46 95/44 HR 43 94/50 HR 44  Last night  112/60 HR 46 117/64 HR 47  Reviewed with Dr Oval Linsey, will decrease Hydralazine to twice a day (leave off afternoon dose) and decrease Clonidine to 1/2 tablet twice a day Hold mornings blood pressure medications and monitor tonight before taking  Patients daughter made aware of changes and updated Rx's sent to pharmacy

## 2021-08-04 ENCOUNTER — Inpatient Hospital Stay: Payer: Medicare Other

## 2021-08-04 ENCOUNTER — Other Ambulatory Visit: Payer: Self-pay

## 2021-08-04 ENCOUNTER — Inpatient Hospital Stay (HOSPITAL_BASED_OUTPATIENT_CLINIC_OR_DEPARTMENT_OTHER): Payer: Medicare Other | Admitting: Obstetrics and Gynecology

## 2021-08-04 ENCOUNTER — Other Ambulatory Visit (HOSPITAL_BASED_OUTPATIENT_CLINIC_OR_DEPARTMENT_OTHER): Payer: Self-pay | Admitting: Cardiovascular Disease

## 2021-08-04 VITALS — BP 157/65 | HR 55 | Temp 97.8°F | Resp 20 | Wt 141.6 lb

## 2021-08-04 DIAGNOSIS — M069 Rheumatoid arthritis, unspecified: Secondary | ICD-10-CM | POA: Diagnosis not present

## 2021-08-04 DIAGNOSIS — D3912 Neoplasm of uncertain behavior of left ovary: Secondary | ICD-10-CM

## 2021-08-04 DIAGNOSIS — I471 Supraventricular tachycardia: Secondary | ICD-10-CM | POA: Diagnosis not present

## 2021-08-04 DIAGNOSIS — I129 Hypertensive chronic kidney disease with stage 1 through stage 4 chronic kidney disease, or unspecified chronic kidney disease: Secondary | ICD-10-CM | POA: Diagnosis not present

## 2021-08-04 DIAGNOSIS — D631 Anemia in chronic kidney disease: Secondary | ICD-10-CM

## 2021-08-04 DIAGNOSIS — N9489 Other specified conditions associated with female genital organs and menstrual cycle: Secondary | ICD-10-CM

## 2021-08-04 DIAGNOSIS — R001 Bradycardia, unspecified: Secondary | ICD-10-CM | POA: Diagnosis not present

## 2021-08-04 DIAGNOSIS — E785 Hyperlipidemia, unspecified: Secondary | ICD-10-CM | POA: Diagnosis not present

## 2021-08-04 DIAGNOSIS — N1832 Chronic kidney disease, stage 3b: Secondary | ICD-10-CM | POA: Diagnosis not present

## 2021-08-04 DIAGNOSIS — K219 Gastro-esophageal reflux disease without esophagitis: Secondary | ICD-10-CM | POA: Diagnosis not present

## 2021-08-04 DIAGNOSIS — M8580 Other specified disorders of bone density and structure, unspecified site: Secondary | ICD-10-CM | POA: Diagnosis not present

## 2021-08-04 DIAGNOSIS — E559 Vitamin D deficiency, unspecified: Secondary | ICD-10-CM | POA: Diagnosis not present

## 2021-08-04 DIAGNOSIS — I4819 Other persistent atrial fibrillation: Secondary | ICD-10-CM | POA: Diagnosis not present

## 2021-08-04 LAB — BASIC METABOLIC PANEL
Anion gap: 10 (ref 5–15)
BUN: 40 mg/dL — ABNORMAL HIGH (ref 8–23)
CO2: 24 mmol/L (ref 22–32)
Calcium: 9.8 mg/dL (ref 8.9–10.3)
Chloride: 103 mmol/L (ref 98–111)
Creatinine, Ser: 1.64 mg/dL — ABNORMAL HIGH (ref 0.44–1.00)
GFR, Estimated: 32 mL/min — ABNORMAL LOW (ref >60)
Glucose, Bld: 98 mg/dL (ref 70–99)
Potassium: 4.1 mmol/L (ref 3.5–5.1)
Sodium: 137 mmol/L (ref 135–145)

## 2021-08-04 NOTE — Progress Notes (Signed)
Gynecologic Oncology Consult Visit   Referring Provider: PCP Dr Diona Browner  Chief Concern: adnexal mass  Subjective:  [name] is a 80 y.o. female who is seen in consultation from Dr. Diona Browner for incidentally detected adnexal mass on [name] for hip pain.    04/28/21 [name]  IMPRESSION: 1. No acute osseous findings.  Minimal left hip osteoarthritis. 2. Tendinosis with low-grade insertional tearing of the bilateral gluteus minimus tendons. 3. Mild bilateral gluteus medius tendinosis without tear. 4. Mild tendinosis of the bilateral hamstring tendon origins. 5. Left adnexal cyst measuring up to 3.9 cm with possible small nodule along its superior aspect. Pelvic ultrasound is recommended for further characterization.  Pelvic US 06/09/21 Post hysterectomy with nonvisualization of normal appearing ovaries.   5.2 x 1.7 x 1.8 cm diameter complex cystic nodule in LEFT adnexa question of ovarian or paraovarian origin, complicated by scattered internal echoes and a 5 mm mural nodule which lacks internal blood flow on color Doppler imaging.   CKD with anemia.   Bradycardia   Problem List: Patient Active Problem List   Diagnosis Date Noted   Adnexal mass 08/04/2021   Anemia of chronic renal failure, stage 3b ([place]) 08/03/2021   GERD (gastroesophageal reflux disease) 07/26/2021   Acute pain of left hip 04/20/2021   COVID-19 virus infection 04/08/2021   Diarrhea of presumed infectious origin 12/03/2020   Memory loss 11/10/2020   Urinary frequency 11/10/2020   Sinus bradycardia 07/18/2019   Valvular heart disease 07/18/2019   PSVT (paroxysmal supraventricular tachycardia) ([place]) 05/16/2019   Blistering rash 02/15/2019   Persistent atrial fibrillation ([place]) 07/19/2018   Peripheral edema 02/02/2018   Acute nonintractable headache 02/02/2018   Coronary artery disease, non-occlusive 09/14/2017   Status post ablation of atrial fibrillation 09/14/2017   Essential hypertension 09/14/2017    Tinea pedis 07/14/2017   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/15/2016   Stage 3b chronic kidney disease (CKD) ([place]) 03/14/2016   History of rheumatoid arthritis 03/14/2016   Mitral regurgitation 12/30/2015   Obesity 12/02/2015   Aortic valve insufficiency 12/02/2015   Vitamin D deficiency 04/17/2015   Counseling regarding end of life decision making 04/17/2015   Allergic rhinitis 02/03/2015   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 cath 2/09: no CAD, EF 70%   GERD (gastroesophageal reflux disease) 07/26/2021   HYPERLIPIDEMIA    HYPERTENSION    Mild Aortic insufficiency    Moderate mitral regurgitation    Moderate tricuspid regurgitation    OSTEOPENIA    PAF (paroxysmal atrial fibrillation) ([place])    a. s/p ablation 2013; b. CHADS2VASc -> 4 (HTN, age x 2, female)-->Eliquis.   PAT (paroxysmal atrial tachycardia) ([place])    a. 04/2019 Zio: Occas PACs and rare PVCs. 21 atrial runs - longest 20 beats, max rate 169.   Pneumonia 2009   RA (rheumatoid arthritis) ([facility])    Sinus Bradycardia    a. asymptomatic but prevents use of AVN blocking agents; b. 04/2019 Zio: Avg HR 61 (37-109).   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 05/2019 Echo: EF 60-65%. DD. RVSP 41.42mmHg. Mod dil  LA. Mod MR/TR. Mild AI.    Past Surgical History: Past Surgical History:  Procedure Laterality Date   ABLATION OF DYSRHYTHMIC FOCUS     CARDIAC CATHETERIZATION     COLONOSCOPY  04/18/08   Partial hysterectomy--1979     Thoracentesis   12/20/07    .  Family History: Family History  Problem Relation Age of Onset   Diabetes Other     Breast cancer Paternal Aunt 31   Heart disease Mother    Pancreatic cancer Father    Atrial fibrillation Brother    Heart disease Brother    Heart attack Brother     Social History: Social History   Socioeconomic History   Marital status: Married    Spouse name: Not on file   Number of children: Not on file   Years of education: Not on file   Highest education level: Not on file  Occupational History   Not on file  Tobacco Use   Smoking status: Never   Smokeless tobacco: Never  Vaping Use   Vaping Use: Never used  Substance and Sexual Activity   Alcohol use: No    Alcohol/week: 0.0 standard drinks   Drug use: No   Sexual activity: Never  Other Topics Concern   Not on file  Social History Narrative   Marital Status: widow x 1 yr   Children: 62, grandchildren 50, numerous great grand children   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 Full Code ( reviewed 2015)       Occasional exercise.   Diet: fruits and veggies, lean meats.   Social Determinants of Health   Financial Resource Strain: Not on file  Food Insecurity: Not on file  Transportation Needs: Not on file  Physical Activity: Not on file  Stress: Not on file  Social Connections: Not on file  Intimate Partner Violence: Not on file    Allergies: Allergies  Allergen Reactions   Amlodipine     Ankle swelling   Celecoxib Other (See Comments)    Tachycardia/palpitations   Tramadol Nausea Only    Current Medications: Current Outpatient Medications  Medication Sig Dispense Refill   B Complex Vitamins (VITAMIN B COMPLEX) TABS Take 1 tablet by mouth daily.      chlorthalidone (HYGROTON) 25 MG tablet TAKE 1/2 TABLET DAILY 45 tablet 3    cholecalciferol (VITAMIN D3) 25 MCG (1000 UNIT) tablet Take 1,000 Units by mouth daily.     cloNIDine (CATAPRES) 0.1 MG tablet TAKE 1/2 TABLET BY MOUTH TWICE A DAY 90 tablet 3   ELIQUIS 5 MG TABS tablet TAKE 1 TABLET BY MOUTH TWICE DAILY 180 tablet 1   hydrALAZINE (APRESOLINE) 50 MG tablet Take 1 tablet (50 mg total) by mouth 2 (two) times daily at 8 am and 10 pm. 90 tablet 2   Lidocaine 4 % PTCH Place onto the skin.     losartan (COZAAR) 100 MG tablet TAKE 1 TABLET BY MOUTH ONCE DAILY (Patient not taking: Reported on 08/02/2021) 90 tablet 2   LUMIGAN 0.01 % SOLN Place into both eyes 2 (two) times daily.     ondansetron (ZOFRAN) 4 MG tablet TAKE 1-2 TABLETS BY MOUTH EVERY 8 HOURS AS NEEDED FOR NAUSEA OR VOMITING 20 tablet 0   pantoprazole (PROTONIX) 40 MG  tablet Take 1 tablet (40 mg total) by mouth daily. 30 tablet 11   rosuvastatin (CRESTOR) 10 MG tablet Take 1 tablet (10 mg total) by mouth daily. 90 tablet 3   traMADol (ULTRAM) 50 MG tablet TAKE 1 TABLET BY MOUTH EVERY 8 HOURS AS NEEDED FOR UP TO 5 DAYS FOR SEVERE PAIN 15 tablet 0   No current facility-administered medications for this visit.    Review of Systems General: negative for, fevers, chills, fatigue, changes in sleep, changes in weight or appetite Skin: negative for changes in color, texture, moles or lesions Eyes: negative for, changes in vision, pain, diplopia HEENT: negative for, change in hearing, pain, discharge, tinnitus, vertigo, voice changes, sore throat, neck masses Pulmonary: negative for, dyspnea, orthopnea, productive cough Cardiac: negative for, syncope, discomfort, pressure  She has some chest pain, dyspnea and orthopnea.   Gastrointestinal: negative for, dysphagia, nausea, vomiting, jaundice, pain, constipation, diarrhea, hematemesis, hematochezia Musculoskeletal: negative for, pain, stiffness, swelling, range of motion limitation Hematology: negative for, easy bruising, bleeding Neurologic/Psych: negative for,  headaches, seizures, paralysis, weakness, tremor, change in gait, change in sensation, mood swings, depression, anxiety, change in memory  Objective:  Physical Examination:  BP (!) 157/65   Pulse (!) 55   Temp 97.8 F (36.6 C)   Resp 20   Wt 141 lb 9.6 oz (64.2 kg)   SpO2 100%   BMI 25.08 kg/m    ECOG Performance Status: 1 - Symptomatic but completely ambulatory  General appearance: alert, cooperative, and appears stated age HEENT:neck supple with midline trachea and thyroid without masses Lymph node survey: non-palpable, axillary, inguinal, supraclavicular Cardiovascular: regular rate and rhythm, no murmurs or gallops Respiratory: normal air entry, lungs clear to auscultation Abdomen: soft, non-tender, without masses or organomegaly, no hernias, and well healed incision Back: inspection of back is normal Extremities: extremities normal, atraumatic, no cyanosis or edema Skin exam - normal coloration and turgor, no rashes, no suspicious skin lesions noted. Neurological exam reveals alert, oriented, normal speech, no focal findings or movement disorder noted.  Pelvic: exam chaperoned by nurse;  Vulva: normal appearing vulva with no masses, tenderness or lesions; Vagina: normal vagina; Adnexa: normal adnexa in size, nontender and no masses; Uterus absent Rectal: normal, no masses  Lab Review Labs on site today:  Radiologic Imaging:  Assessment:  SACHIKO METHOT is a 80 y.o. female diagnosed with incidentally detected complex 5.9 cm left adnexal mass with small mural nodule. Low suspicion for cancer.  Prior hysterectomy   Medical co-morbidities complicating care: CKD/anemia, bradycardia Plan:   Problem List Items Addressed This Visit       Other   Adnexal mass    We discussed that the mass was an incidental finding on [name] for left hip pain.  [place] characteristics not highly concerning for cancer.  Asymptomatic now.    Suggested CA125 today and repeat pelvic US in 3  months and return to clinic to see Beckey Rutter NP.    The patient's diagnosis, an outline of the further diagnostic and laboratory studies which will be required, the recommendation, and alternatives were discussed.  All questions were answered to the patient's satisfaction.  A total of 40 minutes were spent with the patient/family today; 50% was spent in education, counseling and coordination of care for adnexal mass.    Mellody Drown, MD   CC:  [name], MD [address] [place],  [place] [address] [phone]

## 2021-08-05 LAB — CA 125: Cancer Antigen (CA) 125: 16 U/mL (ref 0.0–38.1)

## 2021-08-09 NOTE — Telephone Encounter (Signed)
This has been taken care of.   There was an issue with how the order was placed. This has been corrected and they are working on the referral.   Nothing further needed.

## 2021-08-17 ENCOUNTER — Other Ambulatory Visit: Payer: Self-pay

## 2021-08-17 ENCOUNTER — Observation Stay
Admission: EM | Admit: 2021-08-17 | Discharge: 2021-08-18 | Disposition: A | Discharge status: Home / Self Care | Payer: Medicare Other | Attending: Obstetrics and Gynecology | Admitting: Obstetrics and Gynecology

## 2021-08-17 ENCOUNTER — Emergency Department: Payer: Medicare Other

## 2021-08-17 ENCOUNTER — Encounter: Payer: Self-pay | Admitting: Emergency Medicine

## 2021-08-17 DIAGNOSIS — R079 Chest pain, unspecified: Secondary | ICD-10-CM | POA: Diagnosis present

## 2021-08-17 DIAGNOSIS — I5032 Chronic diastolic (congestive) heart failure: Secondary | ICD-10-CM | POA: Diagnosis present

## 2021-08-17 DIAGNOSIS — I4891 Unspecified atrial fibrillation: Secondary | ICD-10-CM | POA: Diagnosis not present

## 2021-08-17 DIAGNOSIS — R000 Tachycardia, unspecified: Secondary | ICD-10-CM | POA: Diagnosis not present

## 2021-08-17 DIAGNOSIS — I2510 Atherosclerotic heart disease of native coronary artery without angina pectoris: Secondary | ICD-10-CM | POA: Insufficient documentation

## 2021-08-17 DIAGNOSIS — Z20822 Contact with and (suspected) exposure to covid-19: Secondary | ICD-10-CM | POA: Diagnosis not present

## 2021-08-17 DIAGNOSIS — R0789 Other chest pain: Secondary | ICD-10-CM | POA: Diagnosis not present

## 2021-08-17 DIAGNOSIS — D638 Anemia in other chronic diseases classified elsewhere: Secondary | ICD-10-CM | POA: Diagnosis present

## 2021-08-17 DIAGNOSIS — I130 Hypertensive heart and chronic kidney disease with heart failure and stage 1 through stage 4 chronic kidney disease, or unspecified chronic kidney disease: Secondary | ICD-10-CM | POA: Insufficient documentation

## 2021-08-17 DIAGNOSIS — K219 Gastro-esophageal reflux disease without esophagitis: Secondary | ICD-10-CM | POA: Diagnosis present

## 2021-08-17 DIAGNOSIS — I5033 Acute on chronic diastolic (congestive) heart failure: Secondary | ICD-10-CM | POA: Diagnosis not present

## 2021-08-17 DIAGNOSIS — E782 Mixed hyperlipidemia: Secondary | ICD-10-CM | POA: Diagnosis present

## 2021-08-17 DIAGNOSIS — Z743 Need for continuous supervision: Secondary | ICD-10-CM | POA: Diagnosis not present

## 2021-08-17 DIAGNOSIS — I10 Essential (primary) hypertension: Secondary | ICD-10-CM | POA: Diagnosis present

## 2021-08-17 DIAGNOSIS — Z79899 Other long term (current) drug therapy: Secondary | ICD-10-CM | POA: Diagnosis not present

## 2021-08-17 DIAGNOSIS — I499 Cardiac arrhythmia, unspecified: Secondary | ICD-10-CM | POA: Diagnosis not present

## 2021-08-17 DIAGNOSIS — N1832 Chronic kidney disease, stage 3b: Secondary | ICD-10-CM | POA: Diagnosis present

## 2021-08-17 DIAGNOSIS — R0602 Shortness of breath: Secondary | ICD-10-CM | POA: Diagnosis not present

## 2021-08-17 DIAGNOSIS — J811 Chronic pulmonary edema: Secondary | ICD-10-CM | POA: Diagnosis not present

## 2021-08-17 DIAGNOSIS — I517 Cardiomegaly: Secondary | ICD-10-CM | POA: Diagnosis not present

## 2021-08-17 LAB — BASIC METABOLIC PANEL
Anion gap: 9 (ref 5–15)
BUN: 35 mg/dL — ABNORMAL HIGH (ref 8–23)
CO2: 22 mmol/L (ref 22–32)
Calcium: 9.7 mg/dL (ref 8.9–10.3)
Chloride: 107 mmol/L (ref 98–111)
Creatinine, Ser: 1.64 mg/dL — ABNORMAL HIGH (ref 0.44–1.00)
GFR, Estimated: 32 mL/min — ABNORMAL LOW (ref >60)
Glucose, Bld: 92 mg/dL (ref 70–99)
Potassium: 4.1 mmol/L (ref 3.5–5.1)
Sodium: 138 mmol/L (ref 135–145)

## 2021-08-17 LAB — CBC WITH DIFFERENTIAL/PLATELET
Abs Immature Granulocytes: 0.02 10*3/uL (ref 0.00–0.07)
Basophils Absolute: 0.1 10*3/uL (ref 0.0–0.1)
Basophils Relative: 1 %
Eosinophils Absolute: 0.2 10*3/uL (ref 0.0–0.5)
Eosinophils Relative: 3 %
HCT: 33 % — ABNORMAL LOW (ref 36.0–46.0)
Hemoglobin: 11 g/dL — ABNORMAL LOW (ref 12.0–15.0)
Immature Granulocytes: 0 %
Lymphocytes Relative: 36 %
Lymphs Abs: 2.3 10*3/uL (ref 0.7–4.0)
MCH: 31.6 pg (ref 26.0–34.0)
MCHC: 33.3 g/dL (ref 30.0–36.0)
MCV: 94.8 fL (ref 80.0–100.0)
Monocytes Absolute: 0.5 10*3/uL (ref 0.1–1.0)
Monocytes Relative: 9 %
Neutro Abs: 3.2 10*3/uL (ref 1.7–7.7)
Neutrophils Relative %: 51 %
Platelets: 177 10*3/uL (ref 150–400)
RBC: 3.48 MIL/uL — ABNORMAL LOW (ref 3.87–5.11)
RDW: 12.2 % (ref 11.5–15.5)
WBC: 6.3 10*3/uL (ref 4.0–10.5)
nRBC: 0 % (ref 0.0–0.2)

## 2021-08-17 LAB — BRAIN NATRIURETIC PEPTIDE: B Natriuretic Peptide: 353.9 pg/mL — ABNORMAL HIGH (ref 0.0–100.0)

## 2021-08-17 LAB — RESP PANEL BY RT-PCR (FLU A&B, COVID) ARPGX2
Influenza A by PCR: NEGATIVE
Influenza B by PCR: NEGATIVE
SARS Coronavirus 2 by RT PCR: NEGATIVE

## 2021-08-17 LAB — HEMOGLOBIN A1C
Hgb A1c MFr Bld: 5.3 % (ref 4.8–5.6)
Mean Plasma Glucose: 105.41 mg/dL

## 2021-08-17 LAB — TSH: TSH: 2.214 u[IU]/mL (ref 0.350–4.500)

## 2021-08-17 LAB — PROCALCITONIN: Procalcitonin: <0.1 ng/mL

## 2021-08-17 LAB — MAGNESIUM: Magnesium: 2 mg/dL (ref 1.7–2.4)

## 2021-08-17 LAB — TROPONIN I (HIGH SENSITIVITY)
Troponin I (High Sensitivity): 10 ng/L (ref <18)
Troponin I (High Sensitivity): 19 ng/L — ABNORMAL HIGH (ref <18)
Troponin I (High Sensitivity): 17 ng/L (ref <18)

## 2021-08-17 IMAGING — DX DG CHEST 1V PORT
1 series · 1 of 1 positions shown · non-contrast
Comparison: [DATE].

CLINICAL DATA: CP

EXAM:
PORTABLE CHEST 1 VIEW

[chest ap]
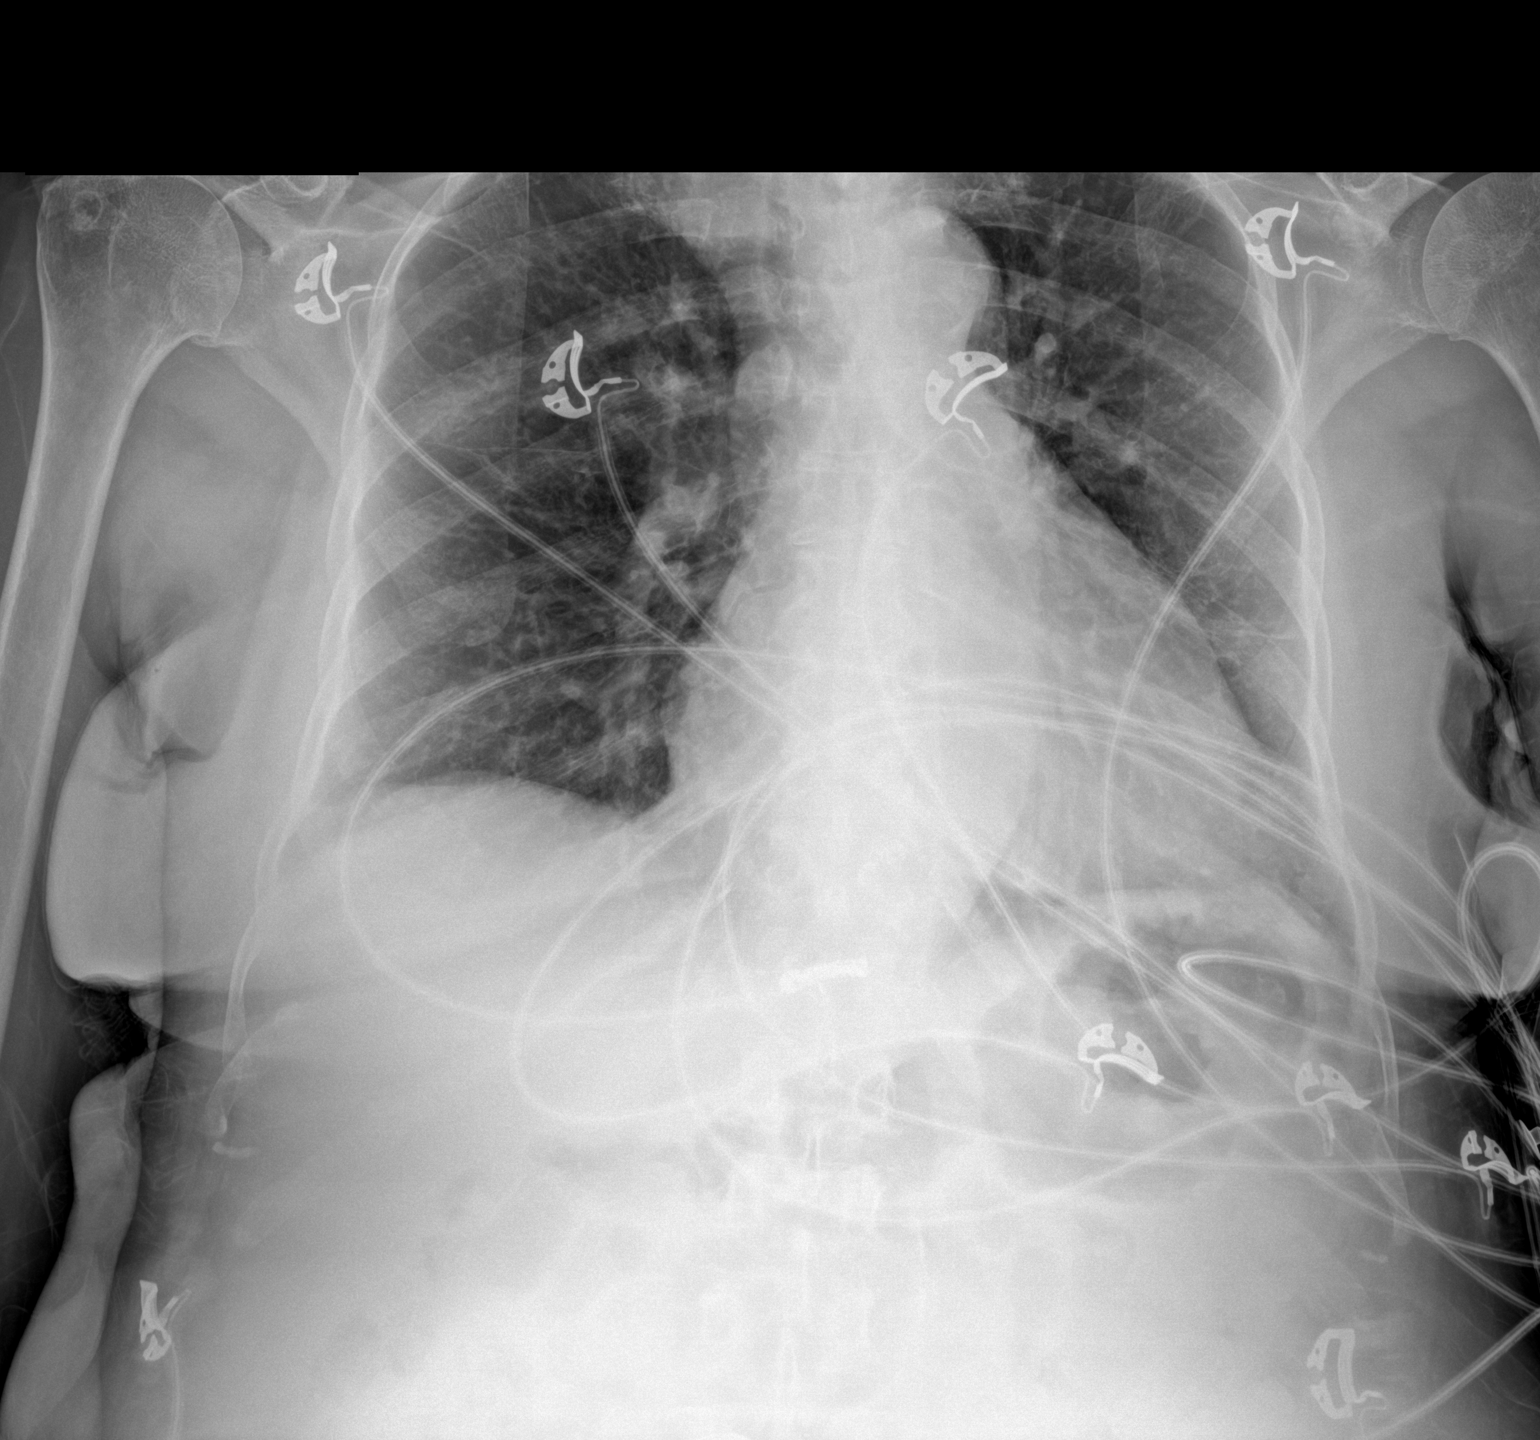

[1 of 1 positions shown; findings below may reference images not displayed]

FINDINGS: Subtle opacity in the right mid to upper. No visible pleural
effusions or pneumothorax. Similar mild enlargement the cardiac
silhouette. Pulmonary vascular congestion.
IMPRESSION: 1. Subtle opacity in the right mid to upper, which could represent
early pneumonia in the correct clinical setting. Recommend imaging
follow-up to resolution.
2. Mild cardiomegaly and pulmonary vascular congestion.

## 2021-08-17 MED ORDER — HYDRALAZINE HCL 20 MG/ML IJ SOLN: 5.0000 mg | INTRAMUSCULAR | Status: DC | PRN

## 2021-08-17 MED ORDER — ALBUTEROL SULFATE HFA 108 (90 BASE) MCG/ACT IN AERS: 2.0000 | INHALATION_SPRAY | RESPIRATORY_TRACT | Status: DC | PRN

## 2021-08-17 MED ORDER — DM-GUAIFENESIN ER 30-600 MG PO TB12: 1.0000 | ORAL_TABLET | Freq: Two times a day (BID) | ORAL | Status: DC | PRN

## 2021-08-17 MED ORDER — DILTIAZEM HCL 25 MG/5ML IV SOLN
5.0000 mg | Freq: Once | INTRAVENOUS | Status: AC
  Administered 2021-08-17: 5 mg via INTRAVENOUS
  Filled 2021-08-17: qty 5

## 2021-08-17 MED ORDER — DILTIAZEM HCL-DEXTROSE 125-5 MG/125ML-% IV SOLN (PREMIX)
5.0000 mg/h | INTRAVENOUS | Status: DC
Start: 2021-08-17 — End: 2021-08-18
  Administered 2021-08-17: 5 mg/h via INTRAVENOUS
  Filled 2021-08-17: qty 125

## 2021-08-17 MED ORDER — MORPHINE SULFATE (PF) 2 MG/ML IV SOLN: 0.5000 mg | INTRAVENOUS | Status: DC | PRN

## 2021-08-17 MED ORDER — FUROSEMIDE 10 MG/ML IJ SOLN
40.0000 mg | Freq: Once | INTRAMUSCULAR | Status: AC
  Administered 2021-08-17: 40 mg via INTRAVENOUS
  Filled 2021-08-17: qty 4

## 2021-08-17 MED ORDER — ACETAMINOPHEN 325 MG PO TABS: 650.0000 mg | ORAL_TABLET | Freq: Four times a day (QID) | ORAL | Status: DC | PRN

## 2021-08-17 MED ORDER — ONDANSETRON HCL 4 MG/2ML IJ SOLN: 4.0000 mg | Freq: Three times a day (TID) | INTRAMUSCULAR | Status: DC | PRN

## 2021-08-17 MED ORDER — METOPROLOL TARTRATE 5 MG/5ML IV SOLN
5.0000 mg | INTRAVENOUS | Status: AC | PRN
  Administered 2021-08-17 (×3): 5 mg via INTRAVENOUS
  Filled 2021-08-17 (×3): qty 5

## 2021-08-17 MED ORDER — ALBUTEROL SULFATE (2.5 MG/3ML) 0.083% IN NEBU: 2.5000 mg | INHALATION_SOLUTION | RESPIRATORY_TRACT | Status: DC | PRN

## 2021-08-17 MED ORDER — NITROGLYCERIN 0.4 MG SL SUBL: 0.4000 mg | SUBLINGUAL_TABLET | SUBLINGUAL | Status: DC | PRN

## 2021-08-17 NOTE — H&P (Signed)
History and Physical    [name]:[id number] DOB: Feb 03, 1941 DOA: 08/17/2021  Referring MD/NP/PA:   PCP: [name], MD   Patient coming from:  The patient is coming from home.  At baseline, pt is independent for most of ADL.        Chief Complaint: chest pain and SOB  HPI: [name] is a 80 y.o. female with medical history significant of dCHF, hypertension, hyperlipidemia, GERD, rheumatoid arthritis, cough on Eliquis (s/p of ablation), aortic valve stenosis, tricuspid valve and mitral valve regurgitation, CAD, anemia, CKD stage IIIb, COVID infection, memory loss, who presents with chest pain or shortness of breath  Patient states that his symptoms started yesterday, including shortness of breath and chest pain.  Her chest pain is located in the substernal area, dull, initially 8 out of 10 in severity, currently mild, nonradiating, associated with palpitation.  Patient has mild cough with little mucus production.  No fever or chills.  She states that her shortness of breath has been progressively worsening.  She also has worsening bilateral lower leg edema.  She states that she had diarrhea last night which has resolved.  Currently no nausea, vomiting, diarrhea or abdominal pain.  No symptoms of UTI.  Patient was found to have A. fib with RVR with heart rate 90-150s.  After giving 3 doses of 5mg  of metoprolol, heart rate is still in the 120-130s.  Cardizem drip was started in the ED.  ED Course: pt was found to have BNP 353, troponin level 10, WBC 6.3, negative COVID PCR, renal function close to baseline, temperature normal, blood pressure 171/103, 119/80, RR 21, oxygen saturation 100% on room air.  Chest x-ray showed cardiomegaly, vascular congestion and mild opacity in the right middle lobe and right upper lobe.  Patient is admitted to progressive bed as inpatient.  Review of Systems:   General: no fevers, chills, no body weight gain, has fatigue HEENT: no blurry  vision, hearing changes or sore throat Respiratory: has dyspnea, coughing, no wheezing CV: has chest pain and palpitations GI: no nausea, vomiting, abdominal pain, diarrhea, constipation GU: no dysuria, burning on urination, increased urinary frequency, hematuria  Ext: has leg edema Neuro: no unilateral weakness, numbness, or tingling, no vision change or hearing loss Skin: no rash, no skin tear. MSK: No muscle spasm, no deformity, no limitation of range of movement in spin Heme: No easy bruising.  Travel history: No recent long distant travel.  Allergy:  Allergies  Allergen Reactions   Amlodipine     Ankle swelling   Celecoxib Other (See Comments) and Rash    Tachycardia/palpitations Other reaction(s): Other Tachycardia/palpitations Tachycardia/palpitations   Tramadol Nausea Only    Past Medical History:  Diagnosis Date   Anemia    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 cath 2/09: no CAD, EF 70%   GERD (gastroesophageal reflux disease) 07/26/2021   HYPERLIPIDEMIA    HYPERTENSION    Mild Aortic insufficiency    Moderate mitral regurgitation    Moderate tricuspid regurgitation    OSTEOPENIA    PAF (paroxysmal atrial fibrillation) ([place])    a. s/p ablation 2013; b. CHADS2VASc -> 4 (HTN, age x 2, female)-->Eliquis.   [name] (paroxysmal atrial tachycardia) ([place])    a. 04/2019 Zio: Occas PACs and rare PVCs. 21 atrial runs - longest 20 beats, max rate 169.   Pneumonia 2009  RA (rheumatoid arthritis) ([facility])    Sinus Bradycardia    a. asymptomatic but prevents use of AVN blocking agents; b. 04/2019 Zio: Avg HR 61 (37-109).   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 05/2019 Echo: EF 60-65%. DD. RVSP 41.6mmHg. Mod dil LA. Mod MR/TR. Mild AI.    Past Surgical History:  Procedure Laterality Date   ABLATION OF DYSRHYTHMIC FOCUS     CARDIAC CATHETERIZATION      COLONOSCOPY  04/18/08   Partial hysterectomy--1979     Thoracentesis   12/20/07      Social History:  reports that she has never smoked. She has never used smokeless tobacco. She reports that she does not drink alcohol and does not use drugs.  Family History:  Family History  Problem Relation Age of Onset   Diabetes Other    Breast cancer Paternal Aunt 2   Heart disease Mother    Pancreatic cancer Father    Atrial fibrillation Brother    Heart disease Brother    Heart attack Brother      Prior to Admission medications   Medication Sig Start Date End Date Taking? Authorizing Provider  B Complex Vitamins (VITAMIN B COMPLEX) TABS Take 1 tablet by mouth daily.     [provider]  chlorthalidone (HYGROTON) 25 MG tablet TAKE 1/2 TABLET DAILY 07/29/21   [name], MD  cholecalciferol (VITAMIN D3) 25 MCG (1000 UNIT) tablet Take 1,000 Units by mouth daily.    [provider]  cloNIDine (CATAPRES) 0.1 MG tablet TAKE 1/2 TABLET BY MOUTH TWICE A DAY 08/03/21   [name], MD  ELIQUIS 5 MG TABS tablet TAKE 1 TABLET BY MOUTH TWICE DAILY 02/18/21   End, Harrell Gave, MD  hydrALAZINE (APRESOLINE) 50 MG tablet Take 1 tablet (50 mg total) by mouth 2 (two) times daily at 8 am and 10 pm. 08/03/21   [name], MD  Lidocaine 4 % PTCH Place onto the skin. Patient not taking: Reported on 08/04/2021 04/10/21   [provider]  losartan (COZAAR) 100 MG tablet TAKE 1 TABLET BY MOUTH ONCE DAILY 04/15/21   [name], MD  LUMIGAN 0.01 % SOLN Place into both eyes 2 (two) times daily. 05/28/20   [provider]  ondansetron (ZOFRAN) 4 MG tablet TAKE 1-2 TABLETS BY MOUTH EVERY 8 HOURS AS NEEDED FOR NAUSEA OR VOMITING 07/28/21   [name], NP  pantoprazole (PROTONIX) 40 MG tablet Take 1 tablet (40 mg total) by mouth daily. 07/26/21   [name], MD  rosuvastatin (CRESTOR) 10 MG tablet Take 1 tablet (10 mg total) by mouth daily. 01/14/21 04/27/22   [name], MD  traMADol (ULTRAM) 50 MG tablet TAKE 1 TABLET BY MOUTH EVERY 8 HOURS AS NEEDED FOR UP TO 5 DAYS FOR SEVERE PAIN Patient not taking: Reported on 08/04/2021 07/23/21   [name], MD    Physical Exam: Vitals:   08/17/21 1602 08/17/21 1602 08/17/21 1628 08/17/21 1630  BP:    116/70  Pulse: 100  100 93  Resp: 16  11 19   Temp:  98.3 F (36.8 C)    TempSrc:  Oral    SpO2: 99%  100% 100%   General: Not in acute distress HEENT:       Eyes: PERRL, EOMI, no scleral icterus.       ENT: No discharge from the ears and nose, no pharynx injection, no tonsillar enlargement.        Neck:  positive JVD, no bruit, no mass felt. Heme: No neck lymph node enlargement. Cardiac: S1/S2, RRR, No gallops or rubs. Respiratory: Has crackles bilaterally GI: Soft, nondistended, nontender, no rebound pain, no organomegaly, BS present. GU: No hematuria Ext: 2+ pitting leg edema bilaterally. 1+DP/PT pulse bilaterally. Musculoskeletal: No joint deformities, No joint redness or warmth, no limitation of ROM in spin. Skin: No rashes.  Neuro: Alert, oriented X3, cranial nerves II-XII grossly intact, moves all extremities normally.  Psych: Patient is not psychotic, no suicidal or hemocidal ideation.  Labs on Admission: I have personally reviewed following labs and imaging studies  CBC: Recent Labs  Lab 08/17/21 1422  WBC 6.3  NEUTROABS 3.2  HGB 11.0*  HCT 33.0*  MCV 94.8  PLT 381   Basic Metabolic Panel: Recent Labs  Lab 08/17/21 1422  NA 138  K 4.1  CL 107  CO2 22  GLUCOSE 92  BUN 35*  CREATININE 1.64*  CALCIUM 9.7  MG 2.0   GFR: Estimated Creatinine Clearance: 25.1 mL/min (A) (by C-G formula based on SCr of 1.64 mg/dL (H)). Liver Function Tests: No results for input(s): AST, ALT, ALKPHOS, BILITOT, PROT, ALBUMIN in the last 168 hours. No results for input(s): LIPASE, AMYLASE in the last 168 hours. No results for input(s): AMMONIA in the last 168 hours. Coagulation  Profile: No results for input(s): INR, PROTIME in the last 168 hours. Cardiac Enzymes: No results for input(s): CKTOTAL, CKMB, CKMBINDEX, TROPONINI in the last 168 hours. BNP (last 3 results) No results for input(s): PROBNP in the last 8760 hours. HbA1C: No results for input(s): HGBA1C in the last 72 hours. CBG: No results for input(s): GLUCAP in the last 168 hours. Lipid Profile: No results for input(s): CHOL, HDL, LDLCALC, TRIG, CHOLHDL, LDLDIRECT in the last 72 hours. Thyroid Function Tests: Recent Labs    08/17/21 1422  TSH 2.214   Anemia Panel: No results for input(s): VITAMINB12, FOLATE, FERRITIN, TIBC, IRON, RETICCTPCT in the last 72 hours. Urine analysis:    Component Value Date/Time   COLORURINE Straw 08/19/2012 0208   COLORURINE YELLOW 01/08/2011 1553   APPEARANCEUR Cloudy 08/19/2012 0208   LABSPEC 1.009 08/19/2012 0208   PHURINE 5.0 08/19/2012 0208   PHURINE 5.5 01/08/2011 1553   GLUCOSEU Negative 08/19/2012 0208   HGBUR 3+ 08/19/2012 0208   HGBUR LARGE (A) 01/08/2011 1553   BILIRUBINUR Negative 11/10/2020 1538   BILIRUBINUR Negative 08/19/2012 0208   KETONESUR trace (5) (A) 10/16/2020 1442   KETONESUR Negative 08/19/2012 0208   KETONESUR NEGATIVE 01/08/2011 1553   PROTEINUR Negative 11/10/2020 1538   PROTEINUR Negative 08/19/2012 0208   PROTEINUR NEGATIVE 01/08/2011 1553   UROBILINOGEN 0.2 11/10/2020 1538   UROBILINOGEN 0.2 01/08/2011 1553   NITRITE Negative 11/10/2020 1538   NITRITE Negative 08/19/2012 0208   NITRITE NEGATIVE 01/08/2011 1553   LEUKOCYTESUR Small (1+) (A) 11/10/2020 1538   LEUKOCYTESUR 2+ 08/19/2012 0208   Sepsis Labs: @LABRCNTIP (procalcitonin:4,lacticidven:4) ) Recent Results (from the past 240 hour(s))  Resp Panel by RT-PCR (Flu A&B, Covid) Nasopharyngeal Swab     Status: None   Collection Time: 08/17/21  2:55 PM   Specimen: Nasopharyngeal Swab; Nasopharyngeal(NP) swabs in vial transport medium  Result Value Ref Range Status    SARS Coronavirus 2 by RT PCR NEGATIVE NEGATIVE Final    Comment: (NOTE) SARS-CoV-2 target nucleic acids are NOT DETECTED.  The SARS-CoV-2 RNA is generally detectable in upper respiratory specimens during the acute phase of infection. The lowest concentration of SARS-CoV-2 viral copies this assay  can detect is 138 copies/mL. A negative result does not preclude SARS-Cov-2 infection and should not be used as the sole basis for treatment or other patient management decisions. A negative result may occur with  improper specimen collection/handling, submission of specimen other than nasopharyngeal swab, presence of viral mutation(s) within the areas targeted by this assay, and inadequate number of viral copies(<138 copies/mL). A negative result must be combined with clinical observations, patient history, and epidemiological information. The expected result is Negative.  Fact Sheet for Patients:  EntrepreneurPulse.com.au  Fact Sheet for Healthcare Providers:  IncredibleEmployment.be  This test is no t yet approved or cleared by the [place] FDA and  has been authorized for detection and/or diagnosis of SARS-CoV-2 by FDA under an Emergency Use Authorization (EUA). This EUA will remain  in effect (meaning this test can be used) for the duration of the COVID-19 declaration under Section 564(b)(1) of the Act, 21 U.S.C.section 360bbb-3(b)(1), unless the authorization is terminated  or revoked sooner.       Influenza A by PCR NEGATIVE NEGATIVE Final   Influenza B by PCR NEGATIVE NEGATIVE Final    Comment: (NOTE) The Xpert Xpress SARS-CoV-2/FLU/RSV plus assay is intended as an aid in the diagnosis of influenza from Nasopharyngeal swab specimens and should not be used as a sole basis for treatment. Nasal washings and aspirates are unacceptable for Xpert Xpress SARS-CoV-2/FLU/RSV testing.  Fact Sheet for  Patients: EntrepreneurPulse.com.au  Fact Sheet for Healthcare Providers: IncredibleEmployment.be  This test is not yet approved or cleared by the [place] FDA and has been authorized for detection and/or diagnosis of SARS-CoV-2 by FDA under an Emergency Use Authorization (EUA). This EUA will remain in effect (meaning this test can be used) for the duration of the COVID-19 declaration under Section 564(b)(1) of the Act, 21 U.S.C. section 360bbb-3(b)(1), unless the authorization is terminated or revoked.  Performed at [facility], [address], [place], [place] [address]      Radiological Exams on Admission: DG Chest Portable 1 View  Result Date: 08/17/2021 CLINICAL DATA:  CP EXAM: PORTABLE CHEST 1 VIEW COMPARISON:  July 28, 2021. FINDINGS: Subtle opacity in the right mid to upper. No visible pleural effusions or pneumothorax. Similar mild enlargement the cardiac silhouette. Pulmonary vascular congestion. IMPRESSION: 1. Subtle opacity in the right mid to upper, which could represent early pneumonia in the correct clinical setting. Recommend imaging follow-up to resolution. 2. Mild cardiomegaly and pulmonary vascular congestion. Electronically Signed   By: Margaretha Sheffield M.D.   On: 08/17/2021 14:47     EKG: I have personally reviewed.  Atrial fibrillation, QTC 413, heart rate 129, early R wave progression, nonspecific T wave change   Assessment/Plan Principal Problem:   Acute on chronic diastolic CHF (congestive heart failure) (HCC) Active Problems:   HYPERLIPIDEMIA   Anemia of chronic disease   Stage 3b chronic kidney disease (CKD) (HCC)   Essential hypertension   GERD (gastroesophageal reflux disease)   Atrial fibrillation with RVR ([facility])   CAD (coronary artery disease)   Chest pain   Acute on chronic diastolic CHF (congestive heart failure) [facility]): Patient has history of diastolic CHF.  2D echo on 4/0/8144 showed  diastolic dysfunction.  2D echo on 05/14/2019 showed EF 60-65% with left ventricular diastolic Doppler parameters consistent with pseudonormalization.  Patient has bilateral leg edema, positive JVD, crackles on auscultation, elevated BNP 353, chest x-ray showed cardiomegaly with vascular congestion.  Clinically consistent with CHF exacerbation.  Chest x-ray also showed mild  opacity in right middle lobe and the right upper lobe, but patient does not have fever or leukocytosis, clinically does not seem to have pneumonia.  Will treat the patient as CHF exacerbation.  -Will admit to progressive unit as inpatient -Gentle IV Lasix 20 mg bid by IV (patient received 40 mg Lasix in ED) -2d echo -Daily weights -strict I/O's -Low salt diet -Fluid restriction -Obtain REDs Vest reading -check procalcitonin level  Atrial fibrillation with RVR: Heart rate up to 150s.  After giving 3 dose of 5 mg of metoprolol in ED, heart rate is still in 120-130s.  Cardizem drip started -Continue Cardizem drip  CAD and chest pain: Initial troponin negative, possibly due to demand ischemia.  Patient is on Eliquis, low suspicions for PE. -Trend troponin -Crestor -As needed nitroglycerin and morphine -Check A1c, FLP -Patient is on Eliquis  HYPERLIPIDEMIA -Crestor  Anemia of chronic disease: Hemoglobin stable 11.0.  Normocytic anemia -Follow-up with CBC  Stage 3b chronic kidney disease (CKD) ([place]): Baseline creatinine 1.3-1.6 recently.  His creatinine is 1.64, BUN 35, close to baseline. -Follow-up with BMP  Essential hypertension: Blood pressure 116/70 -IV hydralazine as needed -Patient is on Cardizem drip -Hold Hygroton, oral hydralazine, Cozaar -Continue home clonidine, but start tomorrow morning  GERD (gastroesophageal reflux disease) -Protonix   DVT ppx: on Eliquis Code Status: Full code Family Communication:  Yes, patient's husband and daughter   at bed side Disposition Plan:  Anticipate discharge back  to previous environment Consults called:  none Admission status and Level of care: Telemetry Cardiac:   as inpt       Status is: Inpatient  Remains inpatient appropriate because: Patient has multiple comorbidities, now presents with chest pain and possible CHF exacerbation, BNP 353, also has atrial fibrillation with RVR, heart rate up to 150s.  Her presentation is highly complicated patient is at high risk of deteriorating.  Will need to be treated in hospital for at least 2 days           Date of Service 08/17/2021    [facility]   If 7PM-7AM, please contact night-coverage [email or web address] 08/17/2021, 5:07 PM

## 2021-08-17 NOTE — ED Triage Notes (Signed)
Pt to ED via [facility] from [facility] for chest pain. Pt states that she has been having mid sternal chest pain since last night. Pt states that the pain has been constant since last night. Pt was found to be in A. Fib as well. Pt has hx/o of A. Fib and is on Eliquis 5 mg. Per EMS with them pts rate was between 90-150s.   Pt is currently c/o 8/10 chest pain that is located in the center of her chest. Pt states that the pain does not radiate anywhere. Pt states that she is also c/o shortness of breath and nausea. Pt does have swelling in her legs.   Pt is able to speak in complete sentences at this time and does not appear to be in acute distress.

## 2021-08-17 NOTE — ED Notes (Signed)
Based on pt parameters, no change/titration needed at current time for Diltiazem drip. Will continue to monitor.

## 2021-08-17 NOTE — ED Provider Notes (Signed)
[facility] Emergency Department Provider Note   ____________________________________________   Event Date/Time   First MD Initiated Contact with Patient 08/17/21 1403     (approximate)  I have reviewed the triage vital signs and the nursing notes.   HISTORY  Chief Complaint Chest Pain    HPI [name] is a 80 y.o. female with past medical history of hypertension, hyperlipidemia, CAD, CKD, rheumatoid arthritis, and paroxysmal atrial fibrillation on Eliquis who presents to the ED complaining of chest pain.  Patient reports that she has been dealing with constant aching pain in the center of her chest for the past 24 hours.  This been associated with mild difficulty breathing but she denies any fevers or cough.  She initially presented to urgent care earlier today, found to be in atrial fibrillation with RVR.  She denies any palpitations, states she is typically in normal sinus rhythm but will have chest pain like this when she goes into A. fib.  She reports taking Eliquis for her A. fib, states she has not missed any doses.  She denies any abdominal pain, nausea, vomiting, diarrhea, dysuria, or flank pain.        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 cath 2/09: no CAD, EF 70%   GERD (gastroesophageal reflux disease) 07/26/2021   HYPERLIPIDEMIA    HYPERTENSION    Mild Aortic insufficiency    Moderate mitral regurgitation    Moderate tricuspid regurgitation    OSTEOPENIA    PAF (paroxysmal atrial fibrillation) ([place])    a. s/p ablation 2013; b. CHADS2VASc -> 4 (HTN, age x 2, female)-->Eliquis.   [name] (paroxysmal atrial tachycardia) ([place])    a. 04/2019 Zio: Occas PACs and rare PVCs. 21 atrial runs - longest 20 beats, max rate 169.   Pneumonia 2009   RA (rheumatoid arthritis) ([facility])    Sinus Bradycardia     a. asymptomatic but prevents use of AVN blocking agents; b. 04/2019 Zio: Avg HR 61 (37-109).   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 05/2019 Echo: EF 60-65%. DD. RVSP 41.25mmHg. Mod dil LA. Mod MR/TR. Mild AI.    Patient Active Problem List   Diagnosis Date Noted   Adnexal mass 08/04/2021   Anemia of chronic renal failure, stage 3b ([place]) 08/03/2021   GERD (gastroesophageal reflux disease) 07/26/2021   Acute pain of left hip 04/20/2021   COVID-19 virus infection 04/08/2021   Diarrhea of presumed infectious origin 12/03/2020   Memory loss 11/10/2020   Urinary frequency 11/10/2020   Sinus bradycardia 07/18/2019   Valvular heart disease 07/18/2019   PSVT (paroxysmal supraventricular tachycardia) ([place]) 05/16/2019   Blistering rash 02/15/2019   Persistent atrial fibrillation ([place]) 07/19/2018   Peripheral edema 02/02/2018   Acute nonintractable headache 02/02/2018   Coronary artery disease, non-occlusive 09/14/2017   Status post ablation of atrial fibrillation 09/14/2017   Essential hypertension 09/14/2017   Tinea pedis 07/14/2017   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/15/2016   Stage 3b chronic kidney disease (CKD) ([place]) 03/14/2016   History of rheumatoid arthritis 03/14/2016   Mitral regurgitation 12/30/2015   Obesity 12/02/2015   Aortic valve insufficiency 12/02/2015   Vitamin D deficiency 04/17/2015   Counseling regarding end of life decision making 04/17/2015   Allergic rhinitis 02/03/2015   Aortic  valve sclerosis 08/19/2010   Osteopenia 05/11/2010   SINUS BRADYCARDIA 08/19/2009   Anemia of chronic disease 12/13/2007   HYPERLIPIDEMIA 08/23/2007   UNSPECIFIED GLAUCOMA 10/10/2006    Past Surgical History:  Procedure Laterality Date   ABLATION OF DYSRHYTHMIC FOCUS     CARDIAC CATHETERIZATION     COLONOSCOPY  04/18/08   Partial hysterectomy--1979     Thoracentesis   12/20/07      Prior to Admission medications   Medication Sig Start Date End Date  Taking? Authorizing Provider  B Complex Vitamins (VITAMIN B COMPLEX) TABS Take 1 tablet by mouth daily.     [provider]  chlorthalidone (HYGROTON) 25 MG tablet TAKE 1/2 TABLET DAILY 07/29/21   [name], MD  cholecalciferol (VITAMIN D3) 25 MCG (1000 UNIT) tablet Take 1,000 Units by mouth daily.    [provider]  cloNIDine (CATAPRES) 0.1 MG tablet TAKE 1/2 TABLET BY MOUTH TWICE A DAY 08/03/21   [name], MD  ELIQUIS 5 MG TABS tablet TAKE 1 TABLET BY MOUTH TWICE DAILY 02/18/21   End, Harrell Gave, MD  hydrALAZINE (APRESOLINE) 50 MG tablet Take 1 tablet (50 mg total) by mouth 2 (two) times daily at 8 am and 10 pm. 08/03/21   [name], MD  Lidocaine 4 % PTCH Place onto the skin. Patient not taking: Reported on 08/04/2021 04/10/21   [provider]  losartan (COZAAR) 100 MG tablet TAKE 1 TABLET BY MOUTH ONCE DAILY 04/15/21   [name], MD  LUMIGAN 0.01 % SOLN Place into both eyes 2 (two) times daily. 05/28/20   [provider]  ondansetron (ZOFRAN) 4 MG tablet TAKE 1-2 TABLETS BY MOUTH EVERY 8 HOURS AS NEEDED FOR NAUSEA OR VOMITING 07/28/21   [name], NP  pantoprazole (PROTONIX) 40 MG tablet Take 1 tablet (40 mg total) by mouth daily. 07/26/21   [name], MD  rosuvastatin (CRESTOR) 10 MG tablet Take 1 tablet (10 mg total) by mouth daily. 01/14/21 04/27/22  [name], MD  traMADol (ULTRAM) 50 MG tablet TAKE 1 TABLET BY MOUTH EVERY 8 HOURS AS NEEDED FOR UP TO 5 DAYS FOR SEVERE PAIN Patient not taking: Reported on 08/04/2021 07/23/21   [name], MD    Allergies Amlodipine, Celecoxib, and Tramadol  Family History  Problem Relation Age of Onset   Diabetes Other    Breast cancer Paternal Aunt 84   Heart disease Mother    Pancreatic cancer Father    Atrial fibrillation Brother    Heart disease Brother    Heart attack Brother     Social History Social History   Tobacco Use   Smoking status: Never    Smokeless tobacco: Never  Vaping Use   Vaping Use: Never used  Substance Use Topics   Alcohol use: No    Alcohol/week: 0.0 standard drinks   Drug use: No    Review of Systems  Constitutional: No fever/chills Eyes: No visual changes. ENT: No sore throat. Cardiovascular: Positive for chest pain. Respiratory: Positive for shortness of breath. Gastrointestinal: No abdominal pain.  No nausea, no vomiting.  No diarrhea.  No constipation. Genitourinary: Negative for dysuria. Musculoskeletal: Negative for back pain. Skin: Negative for rash. Neurological: Negative for headaches, focal weakness or numbness.  ____________________________________________   PHYSICAL EXAM:  VITAL SIGNS: ED Triage Vitals  Enc Vitals Group     BP 08/17/21 1409 (!) 171/103     Pulse Rate 08/17/21 1409 (!) 49     Resp 08/17/21 1409  17     Temp --      Temp src --      SpO2 08/17/21 1407 100 %     Weight --      Height --      Head Circumference --      Peak Flow --      Pain Score 08/17/21 1408 8     Pain Loc --      Pain Edu? --      Excl. in [place]? --     Constitutional: Alert and oriented. Eyes: Conjunctivae are normal. Head: Atraumatic. Nose: No congestion/rhinnorhea. Mouth/Throat: Mucous membranes are moist. Neck: Normal ROM Cardiovascular: Tachycardic, irregularly irregular rhythm. Grossly normal heart sounds.  2+ radial pulses bilaterally. Respiratory: Normal respiratory effort.  No retractions. Lungs CTAB. Gastrointestinal: Soft and nontender. No distention. Genitourinary: deferred Musculoskeletal: No lower extremity tenderness nor edema. Neurologic:  Normal speech and language. No gross focal neurologic deficits are appreciated. Skin:  Skin is warm, dry and intact. No rash noted. Psychiatric: Mood and affect are normal. Speech and behavior are normal.  ____________________________________________   LABS (all labs ordered are listed, but only abnormal results are  displayed)  Labs Reviewed  CBC WITH DIFFERENTIAL/PLATELET - Abnormal; Notable for the following components:      Result Value   RBC 3.48 (*)    Hemoglobin 11.0 (*)    HCT 33.0 (*)    All other components within normal limits  BRAIN NATRIURETIC PEPTIDE - Abnormal; Notable for the following components:   B Natriuretic Peptide 353.9 (*)    All other components within normal limits  RESP PANEL BY RT-PCR (FLU A&B, COVID) ARPGX2  BASIC METABOLIC PANEL  MAGNESIUM  TSH  TROPONIN I (HIGH SENSITIVITY)   ____________________________________________  EKG  ED ECG REPORT I, Blake Divine, the attending physician, personally viewed and interpreted this ECG.   Date: 08/17/2021  EKG Time: 14:12  Rate: 129  Rhythm: atrial fibrillation  Axis: Normal  Intervals:none  ST&T Change: ST and T wave changes inferiorly   PROCEDURES  Procedure(s) performed (including Critical Care):  .Critical Care Performed by: Blake Divine, MD Authorized by: Blake Divine, MD   Critical care provider statement:    Critical care time (minutes):  45   Critical care time was exclusive of:  Separately billable procedures and treating other patients and teaching time   Critical care was necessary to treat or prevent imminent or life-threatening deterioration of the following conditions:  Cardiac failure   Critical care was time spent personally by me on the following activities:  Ordering and performing treatments and interventions, ordering and review of laboratory studies, ordering and review of radiographic studies, pulse oximetry, re-evaluation of patient's condition, review of old charts, obtaining history from patient or surrogate, examination of patient, evaluation of patient's response to treatment and development of treatment plan with patient or surrogate   I assumed direction of critical care for this patient from another provider in my specialty: no      ____________________________________________   INITIAL IMPRESSION / ASSESSMENT AND PLAN / ED COURSE      80 year old female with past medical history of hypertension, hyperlipidemia, CAD, CKD, atrial fibrillation on Eliquis, and rheumatoid arthritis who presents to the ED complaining of constant chest pain and shortness of breath since yesterday.  Patient arrives in atrial fibrillation with rapid ventricular response, ST and T wave changes noted inferiorly, likely rate related.  We will attempt to control her heart rate with IV metoprolol, but  if this is unsuccessful we will start a diltiazem drip.  We will screen electrolytes, cardiac enzymes, and thyroid function.  No evidence of infectious process at this time.  Patient with slight improvement in heart rate following 3 doses of IV metoprolol, but remains tachycardic in the 120s and 130s.  We will start her on a diltiazem drip for better heart rate control.  She does state that her chest pain and shortness of breath are improving, continues to maintain O2 sats on room air.  Chest x-ray reviewed by me and shows questionable right-sided infiltrate, low suspicion for pneumonia at this time.  She also appears to have mild pulmonary edema contributing to her difficulty breathing and we will give dose of IV Lasix.  Lab results are pending at this time, patient turned over to oncoming provider pending reassessment and admission.      ____________________________________________   FINAL CLINICAL IMPRESSION(S) / ED DIAGNOSES  Final diagnoses:  Atrial fibrillation with RVR [facility])     ED Discharge Orders     None        Note:  This document was prepared using Dragon voice recognition software and may include unintentional dictation errors.    Blake Divine, MD 08/17/21 1524

## 2021-08-17 NOTE — ED Notes (Signed)
Pt reported swelling around ankles bilaterally but increased on the left side since this am. Removed pt shoes and socks and applied our socks.

## 2021-08-18 ENCOUNTER — Ambulatory Visit: Payer: Medicare Other | Admitting: Primary Care

## 2021-08-18 DIAGNOSIS — I4891 Unspecified atrial fibrillation: Secondary | ICD-10-CM | POA: Diagnosis present

## 2021-08-18 DIAGNOSIS — I5033 Acute on chronic diastolic (congestive) heart failure: Secondary | ICD-10-CM | POA: Diagnosis not present

## 2021-08-18 LAB — BASIC METABOLIC PANEL
Anion gap: 8 (ref 5–15)
BUN: 39 mg/dL — ABNORMAL HIGH (ref 8–23)
CO2: 26 mmol/L (ref 22–32)
Calcium: 9.7 mg/dL (ref 8.9–10.3)
Chloride: 105 mmol/L (ref 98–111)
Creatinine, Ser: 1.77 mg/dL — ABNORMAL HIGH (ref 0.44–1.00)
GFR, Estimated: 29 mL/min — ABNORMAL LOW (ref >60)
Glucose, Bld: 113 mg/dL — ABNORMAL HIGH (ref 70–99)
Potassium: 3.9 mmol/L (ref 3.5–5.1)
Sodium: 139 mmol/L (ref 135–145)

## 2021-08-18 LAB — LIPID PANEL
Cholesterol: 205 mg/dL — ABNORMAL HIGH (ref 0–200)
HDL: 78 mg/dL (ref >40)
LDL Cholesterol: 118 mg/dL — ABNORMAL HIGH (ref 0–99)
Total CHOL/HDL Ratio: 2.6 RATIO
Triglycerides: 47 mg/dL (ref <150)
VLDL: 9 mg/dL (ref 0–40)

## 2021-08-18 LAB — TROPONIN I (HIGH SENSITIVITY): Troponin I (High Sensitivity): 22 ng/L — ABNORMAL HIGH (ref <18)

## 2021-08-18 LAB — MAGNESIUM: Magnesium: 2 mg/dL (ref 1.7–2.4)

## 2021-08-18 MED ORDER — LATANOPROST 0.005 % OP SOLN
1.0000 [drp] | Freq: Every day | OPHTHALMIC | Status: DC
  Filled 2021-08-18: qty 2.5

## 2021-08-18 MED ORDER — CLONIDINE HCL 0.1 MG PO TABS: 0.1000 mg | ORAL_TABLET | Freq: Two times a day (BID) | ORAL | Status: DC

## 2021-08-18 MED ORDER — METOPROLOL TARTRATE 25 MG PO TABS: 12.5000 mg | ORAL_TABLET | Freq: Two times a day (BID) | ORAL | 11 refills | Status: DC

## 2021-08-18 MED ORDER — B COMPLEX-C PO TABS
1.0000 | ORAL_TABLET | Freq: Every day | ORAL | Status: DC
  Filled 2021-08-18: qty 1

## 2021-08-18 MED ORDER — ROSUVASTATIN CALCIUM 20 MG PO TABS: 10.0000 mg | ORAL_TABLET | Freq: Every day | ORAL | Status: DC

## 2021-08-18 MED ORDER — APIXABAN 5 MG PO TABS
5.0000 mg | ORAL_TABLET | Freq: Two times a day (BID) | ORAL | Status: DC
  Administered 2021-08-18: 5 mg via ORAL
  Filled 2021-08-18: qty 1

## 2021-08-18 MED ORDER — VITAMIN D 25 MCG (1000 UNIT) PO TABS: 1000.0000 [IU] | ORAL_TABLET | Freq: Every day | ORAL | Status: DC

## 2021-08-18 MED ORDER — APIXABAN 5 MG PO TABS: 5.0000 mg | ORAL_TABLET | Freq: Two times a day (BID) | ORAL | Status: DC

## 2021-08-18 MED ORDER — PANTOPRAZOLE SODIUM 40 MG PO TBEC: 40.0000 mg | DELAYED_RELEASE_TABLET | Freq: Every day | ORAL | Status: DC

## 2021-08-18 NOTE — Care Management Obs Status (Signed)
[place] NOTIFICATION   Patient Details  Name: [name] MRN: [id number] Date of Birth: 1941/02/07   Medicare Observation Status Notification Given:  Yes    Adelene Amas, [place] 08/18/2021, 8:48 AM

## 2021-08-18 NOTE — Progress Notes (Signed)
PT Cancellation Note  Patient Details Name: [name] MRN: [id number] DOB: [birth year]   Cancelled Treatment:    Reason Eval/Treat Not Completed: Other (comment). Per attending MD via secure chat, no need for PT/OT consult. Will d/c orders.    [place] Caster. Fairly IV, PT, DPT Physical Therapist- [facility]  08/18/2021, 8:29 AM

## 2021-08-18 NOTE — Care Management Obs Status (Deleted)
[place] NOTIFICATION   Patient Details  Name: [name] MRN: [id number] Date of Birth: 12-01-1940   Medicare Observation Status Notification Given:       Ova Fresh[place] 08/18/2021, 8:46 AM

## 2021-08-18 NOTE — Progress Notes (Signed)
IVs removed from patient. Discharge instructions given to patient. Verbalized understanding. Daughter is at bedside and will be transporting patient home.

## 2021-08-18 NOTE — ED Notes (Signed)
Hospitalist [place] updated about pt's status - no dramatic changes from prior progress note. No new orders per MD. Orders to continue to monitor HR and BP - ED Charge RN notified

## 2021-08-18 NOTE — ED Notes (Signed)
MD notified that pt has had (2) decelerations in HR around 1900/2200 (non sustained with autonomic resolution) and 0230 (sustained) of a HR lower than 60 during occasional beats. HR dropped to 42 at this time- Cardizem stopped. ED Charge RN and hospitalist MD Adrienne Mocha notified. Pt asymptomatic stating "this is the best ive felt in a long time". Pt A&Ox4 and found resting with eyes closed and equal unlabored RR during deceleration- awakened spontaneously without delay.   Verbal orders to hold Cardizem and continue monitor of pt's BP and pulse at this time. Pt normotensive with monitor showing sinus brady. No new orders at this time

## 2021-08-18 NOTE — Care Management CC44 (Signed)
Condition Code 44 Documentation Completed  Patient Details  Name: [name] MRN: [id number] Date of Birth: [birth year]   Condition Code 44 given:    Patient signature on Condition Code 44 notice:    Documentation of 2 MD's agreement:    Code 44 added to claim:       Adelene Amas, [place] 08/18/2021, 8:46 AM

## 2021-08-18 NOTE — Progress Notes (Signed)
PT Cancellation Note  Patient Details Name: [name] MRN: [id number] DOB: 1941/08/09   Cancelled Treatment:    Reason Eval/Treat Not Completed: Medical issues which prohibited therapy. Orders received and chart reviewed. Per EMR Cardizem drip started due to pt experiencing A-fib with RVR. Per PT protocol will hold for 24 hours before initiating therapy. Will initiate after 24 hours as available.   [place] Caster. Fairly IV, PT, DPT Physical Therapist- [facility]  08/18/2021, 8:15 AM

## 2021-08-18 NOTE — Progress Notes (Signed)
OT Cancellation Note  Patient Details Name: EWELINA NAVES MRN: [id number] DOB: 06/22/1941   Cancelled Treatment:    Reason Eval/Treat Not Completed: Other (comment) OT order received and chart reviewed. Upon chart review this AM, noted that Cardizem gtt started at [place] yesterday 11/08. Therapy guidelines recommend initiating mobilization after 24hrs of initiation of Cardizem gtt. That said, upon communicating with attending MD (therapy consult from ED MD) via secure chat, he states "no need for PT/OT consult". Will complete order and sign off at this time. Thank you.  Gerrianne Scale, [place], OTR/L ascom [phone] 08/18/21, 8:31 AM

## 2021-08-18 NOTE — Care Management CC44 (Signed)
Condition Code 44 Documentation Completed  Patient Details  Name: [name] MRN: [id number] Date of Birth: 12/27/1940   Condition Code 44 given:  Yes Patient signature on Condition Code 44 notice:  Yes Documentation of 2 MD's agreement:  Yes Code 44 added to claim:  Yes    Adelene Amas, [place] 08/18/2021, 8:48 AM

## 2021-08-18 NOTE — ED Notes (Signed)
Pt's HR 51-57 and BP slightly hypotensive MD Adrienne Mocha notified- no new orders at this time - verbal orders to continue cardiac monitoring. ED Charge notified. Pt A&Ox4 and in NAD- pt asymptomatic

## 2021-08-18 NOTE — Discharge Summary (Signed)
[name] NUU:[id number] DOB: 12/30/1940 DOA: 08/17/2021  PCP: [name], MD  Admit date: 08/17/2021 Discharge date: 08/18/2021  Time spent: 35 minutes  Recommendations for Outpatient Follow-up:  Close cardiology f/u     Discharge Diagnoses:  Principal Problem:   Acute on chronic diastolic CHF (congestive heart failure) ([place]) Active Problems:   HYPERLIPIDEMIA   Anemia of chronic disease   Stage 3b chronic kidney disease (CKD) ([place])   Essential hypertension   GERD (gastroesophageal reflux disease)   Atrial fibrillation with RVR (H[facility])   CAD (coronary artery disease)   Chest pain   Discharge Condition: stable  Diet recommendation: heart healthy  There were no vitals filed for this visit.  History of present illness:  [name] is a 80 y.o. female with medical history significant of dCHF, hypertension, hyperlipidemia, GERD, rheumatoid arthritis, cough on Eliquis (s/p of ablation), aortic valve stenosis, tricuspid valve and mitral valve regurgitation, CAD, anemia, CKD stage IIIb, COVID infection, memory loss, who presents with chest pain or shortness of breath   Patient states that his symptoms started yesterday, including shortness of breath and chest pain.  Her chest pain is located in the substernal area, dull, initially 8 out of 10 in severity, currently mild, nonradiating, associated with palpitation.  Patient has mild cough with little mucus production.  No fever or chills.  She states that her shortness of breath has been progressively worsening.  She also has worsening bilateral lower leg edema.  She states that she had diarrhea last night which has resolved.  Currently no nausea, vomiting, diarrhea or abdominal pain.  No symptoms of UTI.   Patient was found to have A. fib with RVR with heart rate 90-150s.  After giving 3 doses of 5mg  of metoprolol, heart rate is still in the 120-130s.  Cardizem drip was started in the ED.    Hospital Course:  Patient  presented with mild chest pain and insomnia and shortness of breath. Found to be in a fib with rvr. Mild flat troponin elevation w/ non-ischemic ekg, not felt to be acs. Mild fluid overload 2/2 rvr that was treated with IV lasix. CXR with subtle right lobe opacity but clinically no signs pneumonia so abx held. Treated for RVR with IV diltiazem. RVR quickly resolved and diltiazem stopped. Hemodynamically stable. On hospital day 1 patient returned to baseline, feeling well. Will plan on discharge with start of low-dose metoprolol 12.5 bid, hold hydralazine given normotension here, and close cardiology f/u. Continue other meds including eliquis.  Procedures: none   Consultations: none  Discharge Exam: Vitals:   08/18/21 0700 08/18/21 0730  BP: (!) 111/58 (!) 105/56  Pulse: (!) 54 (!) 52  Resp: 15 18  Temp:    SpO2: 99%     General: NAD Cardiovascular: RRR, mild systolic murmur Respiratory: CTAB  Discharge Instructions   Discharge Instructions     Diet - low sodium heart healthy   Complete by: As directed    Increase activity slowly   Complete by: As directed       Allergies as of 08/18/2021       Reactions   Amlodipine    Ankle swelling   Celecoxib Other (See Comments), Rash   Tachycardia/palpitations Other reaction(s): Other Tachycardia/palpitations Tachycardia/palpitations   Tramadol Nausea Only        Medication List     STOP taking these medications    hydrALAZINE 50 MG tablet Commonly known as: APRESOLINE   traMADol 50 MG tablet Commonly  known as: ULTRAM       TAKE these medications    chlorthalidone 25 MG tablet Commonly known as: HYGROTON TAKE 1/2 TABLET DAILY What changed:  how much to take how to take this when to take this   cholecalciferol 25 MCG (1000 UNIT) tablet Commonly known as: VITAMIN D3 Take 1,000 Units by mouth daily.   cloNIDine 0.1 MG tablet Commonly known as: Catapres TAKE 1/2 TABLET BY MOUTH TWICE A DAY   Eliquis 5  MG Tabs tablet Generic drug: apixaban TAKE 1 TABLET BY MOUTH TWICE DAILY   Lidocaine 4 % Ptch Place onto the skin.   losartan 100 MG tablet Commonly known as: COZAAR TAKE 1 TABLET BY MOUTH ONCE DAILY   Lumigan 0.01 % Soln Generic drug: bimatoprost Place 1 drop into both eyes 2 (two) times daily.   metoprolol tartrate 25 MG tablet Commonly known as: LOPRESSOR Take 0.5 tablets (12.5 mg total) by mouth 2 (two) times daily.   ondansetron 4 MG tablet Commonly known as: ZOFRAN TAKE 1-2 TABLETS BY MOUTH EVERY 8 HOURS AS NEEDED FOR NAUSEA OR VOMITING   pantoprazole 40 MG tablet Commonly known as: PROTONIX Take 1 tablet (40 mg total) by mouth daily.   rosuvastatin 10 MG tablet Commonly known as: CRESTOR Take 1 tablet (10 mg total) by mouth daily.   Vitamin B Complex Tabs Take 1 tablet by mouth daily.       Allergies  Allergen Reactions   Amlodipine     Ankle swelling   Celecoxib Other (See Comments) and Rash    Tachycardia/palpitations Other reaction(s): Other Tachycardia/palpitations Tachycardia/palpitations   Tramadol Nausea Only    Follow-up Information     Your cardiologist Follow up.   Why: call to schedule an appointment within 1 week                 The results of significant diagnostics from this hospitalization (including imaging, microbiology, ancillary and laboratory) are listed below for reference.    Significant Diagnostic Studies: DG Chest 2 View  Result Date: 07/28/2021 CLINICAL DATA:  Chest pain EXAM: CHEST - 2 VIEW COMPARISON:  03/18/2020 FINDINGS: Heart and mediastinal contours are within normal limits. No focal opacities or effusions. No acute bony abnormality. IMPRESSION: No active cardiopulmonary disease. Electronically Signed   By: Rolm Baptise M.D.   On: 07/28/2021 23:10   DG Chest Portable 1 View  Result Date: 08/17/2021 CLINICAL DATA:  CP EXAM: PORTABLE CHEST 1 VIEW COMPARISON:  July 28, 2021. FINDINGS: Subtle opacity in the  right mid to upper. No visible pleural effusions or pneumothorax. Similar mild enlargement the cardiac silhouette. Pulmonary vascular congestion. IMPRESSION: 1. Subtle opacity in the right mid to upper, which could represent early pneumonia in the correct clinical setting. Recommend imaging follow-up to resolution. 2. Mild cardiomegaly and pulmonary vascular congestion. Electronically Signed   By: Margaretha Sheffield M.D.   On: 08/17/2021 14:47    Microbiology: Recent Results (from the past 240 hour(s))  Resp Panel by RT-PCR (Flu A&B, Covid) Nasopharyngeal Swab     Status: None   Collection Time: 08/17/21  2:55 PM   Specimen: Nasopharyngeal Swab; Nasopharyngeal(NP) swabs in vial transport medium  Result Value Ref Range Status   SARS Coronavirus 2 by RT PCR NEGATIVE NEGATIVE Final    Comment: (NOTE) SARS-CoV-2 target nucleic acids are NOT DETECTED.  The SARS-CoV-2 RNA is generally detectable in upper respiratory specimens during the acute phase of infection. The lowest concentration of SARS-CoV-2 viral copies this  assay can detect is 138 copies/mL. A negative result does not preclude SARS-Cov-2 infection and should not be used as the sole basis for treatment or other patient management decisions. A negative result may occur with  improper specimen collection/handling, submission of specimen other than nasopharyngeal swab, presence of viral mutation(s) within the areas targeted by this assay, and inadequate number of viral copies(<138 copies/mL). A negative result must be combined with clinical observations, patient history, and epidemiological information. The expected result is Negative.  Fact Sheet for Patients:  EntrepreneurPulse.com.au  Fact Sheet for Healthcare Providers:  IncredibleEmployment.be  This test is no t yet approved or cleared by the [place] FDA and  has been authorized for detection and/or diagnosis of SARS-CoV-2 by FDA under an  Emergency Use Authorization (EUA). This EUA will remain  in effect (meaning this test can be used) for the duration of the COVID-19 declaration under Section 564(b)(1) of the Act, 21 U.S.C.section 360bbb-3(b)(1), unless the authorization is terminated  or revoked sooner.       Influenza A by PCR NEGATIVE NEGATIVE Final   Influenza B by PCR NEGATIVE NEGATIVE Final    Comment: (NOTE) The Xpert Xpress SARS-CoV-2/FLU/RSV plus assay is intended as an aid in the diagnosis of influenza from Nasopharyngeal swab specimens and should not be used as a sole basis for treatment. Nasal washings and aspirates are unacceptable for Xpert Xpress SARS-CoV-2/FLU/RSV testing.  Fact Sheet for Patients: EntrepreneurPulse.com.au  Fact Sheet for Healthcare Providers: IncredibleEmployment.be  This test is not yet approved or cleared by the [place] FDA and has been authorized for detection and/or diagnosis of SARS-CoV-2 by FDA under an Emergency Use Authorization (EUA). This EUA will remain in effect (meaning this test can be used) for the duration of the COVID-19 declaration under Section 564(b)(1) of the Act, 21 U.S.C. section 360bbb-3(b)(1), unless the authorization is terminated or revoked.  Performed at [facility], [address] [place], [place] [address]      Labs: Basic Metabolic Panel: Recent Labs  Lab 08/17/21 1422 08/18/21 0325  NA 138 139  K 4.1 3.9  CL 107 105  CO2 22 26  GLUCOSE 92 113*  BUN 35* 39*  CREATININE 1.64* 1.77*  CALCIUM 9.7 9.7  MG 2.0 2.0   Liver Function Tests: No results for input(s): AST, ALT, ALKPHOS, BILITOT, PROT, ALBUMIN in the last 168 hours. No results for input(s): LIPASE, AMYLASE in the last 168 hours. No results for input(s): AMMONIA in the last 168 hours. CBC: Recent Labs  Lab 08/17/21 1422  WBC 6.3  NEUTROABS 3.2  HGB 11.0*  HCT 33.0*  MCV 94.8  PLT 177   Cardiac Enzymes: No results  for input(s): CKTOTAL, CKMB, CKMBINDEX, TROPONINI in the last 168 hours. BNP: BNP (last 3 results) Recent Labs    08/17/21 1422  BNP 353.9*    ProBNP (last 3 results) No results for input(s): PROBNP in the last 8760 hours.  CBG: No results for input(s): GLUCAP in the last 168 hours.     Signed:  Desma Maxim MD.  [facility] 08/18/2021, 8:05 AM

## 2021-08-19 ENCOUNTER — Other Ambulatory Visit: Payer: Self-pay

## 2021-08-19 ENCOUNTER — Ambulatory Visit (HOSPITAL_BASED_OUTPATIENT_CLINIC_OR_DEPARTMENT_OTHER): Payer: Medicare Other | Admitting: Cardiovascular Disease

## 2021-08-19 ENCOUNTER — Encounter (HOSPITAL_BASED_OUTPATIENT_CLINIC_OR_DEPARTMENT_OTHER): Payer: Self-pay | Admitting: Cardiovascular Disease

## 2021-08-19 VITALS — BP 120/64 | HR 47 | Ht 63.0 in | Wt 138.6 lb

## 2021-08-19 DIAGNOSIS — I4819 Other persistent atrial fibrillation: Secondary | ICD-10-CM

## 2021-08-19 DIAGNOSIS — I495 Sick sinus syndrome: Secondary | ICD-10-CM

## 2021-08-19 DIAGNOSIS — E782 Mixed hyperlipidemia: Secondary | ICD-10-CM | POA: Diagnosis not present

## 2021-08-19 DIAGNOSIS — I10 Essential (primary) hypertension: Secondary | ICD-10-CM | POA: Diagnosis not present

## 2021-08-19 MED ORDER — METOPROLOL TARTRATE 25 MG PO TABS: ORAL_TABLET | ORAL | 1 refills | Status: DC

## 2021-08-19 NOTE — Assessment & Plan Note (Signed)
Blood pressure today is controlled.  She has not taken any of her antihypertensives because it was low this morning.  Since being in the hospital with A. fib with RVR her blood pressures been running low.  Prior to that her blood pressure was in the 120s to 130s.  Her hydralazine has been held since discharge.  Last night she took losartan, clonidine, and chlorthalidone.  The clonidine could be contributing to her bradycardia.  We will stop it for now.  We will also hold her metoprolol.  She will take it only as needed if she has A. fib with RVR.  Continue losartan and chlorthalidone.  She will continue tracking her blood pressures and we will follow-up in a week to see if we need to make additional changes.

## 2021-08-19 NOTE — Progress Notes (Signed)
Cardiology Office Note:    Date:  08/19/2021   ID:  [name], DOB 11/27/1940, MRN [id number]  PCP:  [name], MD  Cardiologist:  [name], MD   Referring MD: [name], MD   CC: Hypertension  History of Present Illness:    [name] is a 80 y.o. female with a hx of persistent atrial fibrillation status post ablation, bradycardia, CKD 2, mitral regurgitation, aortic regurgitation, hypertension, and hyperlipidemia here for follow up. She had an echo 05/14/2019 that revealed LVEF 60 to 65% with grade 2 diastolic dysfunction and mildly elevated pulmonary pressures.  She was initially seen 12/2020 to establish care in the hypertension clinic.  She was first diagnosed with hypertension many decades ago.  In the past it was well-controlled.  However in the last year she has struggled.  Lately her blood pressure has been in the 160s at home.  She last saw [name] on 11/22/2019.  Prior to that losartan was increased to 50 mg.  Her blood pressure remained above goal since making that change.  [name] increased losartan to 100mg .  She followed up with [name], NP, and hydralazine 25mg  bid was added on 2/26.  At her first visit hydralazine was increased.  She saw [name], NP on 12/2019 an dit was increased to 75mg .  She started to work out at the [employer].  She was seen in the ED with palpitations 03/2020.  She was in sinus rhythm at the time.  This has resolved.   It was noted that she had inadvertently stopped taking her hydrochlorothiazide. She this was resumed when she saw her pharmacist on 03/2020.  Hydralazine was reduced back to 50 mg twice daily.  HCTZ was changed to chlorthalidone. Her lipids were poorly controlled so she was started to rosuvastatin. 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  At her last appointment she reported palpitations but was noted to be in sinus rhythm at that time.  Her renal function was worsening so chlorthalidone was  reduced and she was started on clonidine.  Renal artery Dopplers were ordered but have not yet been completed to rule out renal artery stenosis.  Yesterday and she was seen in the hospital.  She reported shortness of breath and chest pain.  She also had increasing lower extremity edema.  She was noted to be in atrial fibrillation with RVR in the 90s to 150s.  She was given IV diltiazem and IV metoprolol.  High-sensitivity troponin was minimally elevated 22 and flat.  It was thought to be demand ischemia in the setting of A. fib with RVR.  With the initiation of IV diltiazem her heart rate quickly improved.  She was started on metoprolol 12.5 mg twice daily.  Her home hydralazine was held and she was asked to follow-up closely with cardiology.  Since leaving her heart rate has been low. She took her medication last night but none this AM because her BP was 99/57, HR 54.  She has been feeling weak and tired.  She has no more palpitations.     Previous antihypertensives: Amlodipine- ankle swelling Spironolactone- unsure why it was stopped Hydralazine- swelling with 100mg , OK with 50mg  Clonidine- worked well.  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 cath 2/09: no CAD, EF 70%   GERD (gastroesophageal reflux disease) 07/26/2021   HYPERLIPIDEMIA    HYPERTENSION    Mild Aortic insufficiency    Moderate mitral regurgitation    Moderate tricuspid regurgitation    OSTEOPENIA    PAF (paroxysmal atrial fibrillation) ([place])    a. s/p ablation 2013; b. CHADS2VASc -> 4 (HTN, age x 2, female)-->Eliquis.   PAT (paroxysmal atrial tachycardia) ([place])    a. 04/2019 Zio: Occas PACs and rare PVCs. 21 atrial runs - longest 20 beats, max rate 169.   Pneumonia 2009   RA (rheumatoid arthritis) ([facility])    Sinus  Bradycardia    a. asymptomatic but prevents use of AVN blocking agents; b. 04/2019 Zio: Avg HR 61 (37-109).   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 05/2019 Echo: EF 60-65%. DD. RVSP 41.40mmHg. Mod dil LA. Mod MR/TR. Mild AI.    Past Surgical History:  Procedure Laterality Date   ABLATION OF DYSRHYTHMIC FOCUS     CARDIAC CATHETERIZATION     COLONOSCOPY  04/18/08   Partial hysterectomy--1979     Thoracentesis   12/20/07      Current Medications: Current Meds  Medication Sig   B Complex Vitamins (VITAMIN B COMPLEX) TABS Take 1 tablet by mouth daily.    chlorthalidone (HYGROTON) 25 MG tablet Take 25 mg by mouth as directed. Take 1/2 tablet daily   cholecalciferol (VITAMIN D3) 25 MCG (1000 UNIT) tablet Take 1,000 Units by mouth daily.   ELIQUIS 5 MG TABS tablet TAKE 1 TABLET BY MOUTH TWICE DAILY   losartan (COZAAR) 100 MG tablet TAKE 1 TABLET BY MOUTH ONCE DAILY   LUMIGAN 0.01 % SOLN Place 1 drop into both eyes 2 (two) times daily.   pantoprazole (PROTONIX) 40 MG tablet Take 1 tablet (40 mg total) by mouth daily.   rosuvastatin (CRESTOR) 10 MG tablet Take 1 tablet (10 mg total) by mouth daily.   [DISCONTINUED] chlorthalidone (HYGROTON) 25 MG tablet TAKE 1/2 TABLET DAILY (Patient taking differently: Take 12.5 mg by mouth daily. TAKE 1/2 TABLET DAILY)   [DISCONTINUED] cloNIDine (CATAPRES) 0.1 MG tablet TAKE 1/2 TABLET BY MOUTH TWICE A DAY   [DISCONTINUED] metoprolol tartrate (LOPRESSOR) 25 MG tablet Take 0.5 tablets (12.5 mg total) by mouth 2 (two) times daily. (Patient taking differently: Take 12.5 mg by mouth as needed.)   [DISCONTINUED] metoprolol tartrate (LOPRESSOR) 25 MG tablet Take 25 mg by mouth as needed. TAKE 1/2 TABLET AS NEEDED FOR AFIB     Allergies:   Amlodipine, Celecoxib, and Tramadol   Social History   Socioeconomic History   Marital status: Married    Spouse name: Not on file   Number of children: Not on file   Years of education: Not on file    Highest education level: Not on file  Occupational History   Not on file  Tobacco Use   Smoking status: Never   Smokeless tobacco: Never  Vaping Use   Vaping Use: Never used  Substance and Sexual Activity   Alcohol use: No    Alcohol/week: 0.0 standard drinks   Drug use: No   Sexual activity: Never    Birth control/protection: None  Other Topics Concern   Not on file  Social History Narrative   Marital Status: widow x 1 yr   Children: 58, grandchildren 10, numerous great grand children   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 Full Code ( reviewed 2015)       Occasional exercise.   Diet: fruits and veggies, lean meats.   Social Determinants of Health   Financial Resource Strain: Not on file  Food Insecurity: Not on file  Transportation Needs: Not on file  Physical Activity: Not on file  Stress: Not on file  Social Connections: Not on file     Family History: The patient's family history includes Atrial fibrillation in her brother; Breast cancer (age of onset: 34) in her paternal aunt; Diabetes in an other family member; Heart attack in her brother; Heart disease in her brother and mother; Pancreatic cancer in her father.  ROS:   Please see the history of present illness.    (+) Palpitations (+) Acid Reflux All other systems reviewed and are negative.  EKGs/Labs/Other Studies Reviewed:    EKG:     07/26/2021: Sinus rhythm Rate 65 03/23/2021: EKG is not ordered today. 11/27/2020: Sinus rhythm.  Rate 60 bpm. 12/06/19: sinus bradycardia.  Rate 53 bpm.  Non-specific ST-T changes. 08/19/2021: Ectopic atrial bradycardia.  Rate 47 bpm.   Echo 05/14/19:  1. The left ventricle has normal systolic function  with an ejection  fraction of 60-65%. The cavity size was normal. There is mildly increased  left ventricular wall thickness. Left ventricular diastolic Doppler  parameters are consistent with  pseudonormalization.   2. The right ventricle has normal systolic function. The cavity was  normal. There is no increase in right ventricular wall thickness. Right  ventricular systolic pressure is mildly elevated with an estimated  pressure of 41.7 mmHg.   3. Left atrial size was moderately dilated.   4. Incompletely evaluated hypoechoic structure noted in the liver, most  likely a cyst.   5. The mitral valve is degenerative. Mild thickening of the mitral valve  leaflet. Mitral valve regurgitation is moderate by color flow Doppler.   6. Tricuspid valve regurgitation is moderate.   7. The aortic valve has an indeterminate number of cusps. Mild thickening  of the aortic valve. Mild calcification of the aortic valve. Aortic valve  regurgitation is mild by color flow Doppler.   8. The aorta is normal in size and structure.   Recent Labs: 05/25/2021: ALT 11 08/17/2021: B Natriuretic Peptide 353.9; Hemoglobin 11.0; Platelets 177; TSH 2.214 08/18/2021: BUN 39; Creatinine, Ser 1.77; Magnesium 2.0; Potassium 3.9; Sodium 139   Recent Lipid Panel    Component Value Date/Time   CHOL 205 (H) 08/18/2021 0325   TRIG 47 08/18/2021 0325   HDL 78 08/18/2021 0325   CHOLHDL 2.6 08/18/2021 0325   VLDL 9 08/18/2021 0325   LDLCALC 118 (H) 08/18/2021 0325   LDLDIRECT 104.4 11/26/2012 0746    Physical Exam:    VS:  BP 120/64   Pulse (!) 47   Ht 5\' 3"  (1.6 m)   Wt 138 lb 9.6 oz (62.9 kg)   BMI 24.55 kg/m  , BMI Body mass index is 24.55 kg/m. GENERAL:  Well appearing HEENT: Pupils equal round and reactive, fundi not visualized, oral mucosa unremarkable NECK:  No jugular venous distention, waveform within normal limits, carotid upstroke brisk and symmetric, no bruits LUNGS:  Clear to auscultation  bilaterally HEART:  Bradycardic.  Regular rhythm  PMI not displaced or sustained,S1 and S2  within normal limits, no S3, no S4, no clicks, no rubs, III/VI systolic murmur at the left upper sternal border. ABD:  Flat, positive bowel sounds normal in frequency in pitch, no bruits, no rebound, no guarding, no midline pulsatile mass, no hepatomegaly, no splenomegaly EXT:  2 plus pulses throughout, no edema, no cyanosis no clubbing SKIN:  No rashes no nodules NEURO:  Cranial nerves II through XII grossly intact, motor grossly intact throughout PSYCH:  Cognitively intact, oriented to person place and time   ASSESSMENT:    1. Persistent atrial fibrillation ([place])   2. SINUS BRADYCARDIA   3. HYPERLIPIDEMIA   4. Essential hypertension        PLAN:    SINUS BRADYCARDIA Her heart rate has been low since she started metoprolol.  I suspect she has sick sinus syndrome.  We will stop her daily metoprolol and give it to her only as needed when she is in atrial fibrillation with RVR.  We will also have her see EP for consideration of antiarrhythmic strategies.  Clonidine could be contributing, though she was not bradycardic on clonidine prior to adding the metoprolol for her heart rate.  Nevertheless, given that her blood pressure is low now we will stop the clonidine.  HYPERLIPIDEMIA Continue rosuvastatin.  Persistent atrial fibrillation Ms. Alcario Drought is back in sinus rhythm.  Today she is sinus bradycardia.  She is not able to tolerate daily metoprolol.  We will stop it.  We also stop her clonidine.  Continue Eliquis.  Ultimately, I think she would be a good candidate for an antiarrhythmic.  We will have her see EP to see if they think she should be on Tikosyn or something else.  If she continues to have A. fib with RVR, may need to consider pacemaker deal with her sick sinus syndrome.  Essential hypertension Blood pressure today is controlled.  She has not taken any of her antihypertensives because it  was low this morning.  Since being in the hospital with A. fib with RVR her blood pressures been running low.  Prior to that her blood pressure was in the 120s to 130s.  Her hydralazine has been held since discharge.  Last night she took losartan, clonidine, and chlorthalidone.  The clonidine could be contributing to her bradycardia.  We will stop it for now.  We will also hold her metoprolol.  She will take it only as needed if she has A. fib with RVR.  Continue losartan and chlorthalidone.  She will continue tracking her blood pressures and we will follow-up in a week to see if we need to make additional changes.    Disposition: FU with Zarian Colpitts C. Oval Linsey, MD, [facility] in 1 week   Medication Adjustments/Labs and Tests Ordered: Current medicines are reviewed at length with the patient today.  Concerns regarding medicines are outlined above.  Orders Placed This Encounter  Procedures   Ambulatory referral to Cardiac Electrophysiology     Meds ordered this encounter  Medications   metoprolol tartrate (LOPRESSOR) 25 MG tablet    Sig: TAKE 1/2 TABLET AS NEEDED FOR AFIB    Dispense:  30 tablet    Refill:  1    D/C PREVIOUS RX      Signed, [name], MD  08/19/2021 3:03 PM    [facility]

## 2021-08-19 NOTE — Assessment & Plan Note (Signed)
[name] is back in sinus rhythm.  Today she is sinus bradycardia.  She is not able to tolerate daily metoprolol.  We will stop it.  We also stop her clonidine.  Continue Eliquis.  Ultimately, I think she would be a good candidate for an antiarrhythmic.  We will have her see EP to see if they think she should be on Tikosyn or something else.  If she continues to have A. fib with RVR, may need to consider pacemaker deal with her sick sinus syndrome.

## 2021-08-19 NOTE — Assessment & Plan Note (Signed)
Her heart rate has been low since she started metoprolol.  I suspect she has sick sinus syndrome.  We will stop her daily metoprolol and give it to her only as needed when she is in atrial fibrillation with RVR.  We will also have her see EP for consideration of antiarrhythmic strategies.  Clonidine could be contributing, though she was not bradycardic on clonidine prior to adding the metoprolol for her heart rate.  Nevertheless, given that her blood pressure is low now we will stop the clonidine.

## 2021-08-19 NOTE — Patient Instructions (Signed)
Medication Instructions:  STOP CLONIDINE   USE 1/2 METOPROLOL AS NEEDED FOR AFIB   *If you need a refill on your cardiac medications before your next appointment, please call your pharmacy*  Lab Work: NONE  Testing/Procedures: NONE   Follow-Up: WILL CALL YOU WITH APPOINTMENT INFORMATION   You have been referred to ELECTROPHYSIOLOGY

## 2021-08-19 NOTE — Assessment & Plan Note (Signed)
Continue rosuvastatin.  

## 2021-08-20 NOTE — Addendum Note (Signed)
Addended by: Gerald Stabs on: 08/20/2021 12:47 PM   Modules accepted: Orders

## 2021-08-24 ENCOUNTER — Encounter: Payer: Self-pay | Admitting: Cardiology

## 2021-08-24 ENCOUNTER — Ambulatory Visit: Payer: Medicare Other | Admitting: Cardiology

## 2021-08-24 ENCOUNTER — Other Ambulatory Visit: Payer: Self-pay

## 2021-08-24 ENCOUNTER — Ambulatory Visit (INDEPENDENT_AMBULATORY_CARE_PROVIDER_SITE_OTHER): Payer: Medicare Other

## 2021-08-24 VITALS — BP 134/80 | HR 60 | Ht 63.0 in | Wt 142.0 lb

## 2021-08-24 DIAGNOSIS — I4819 Other persistent atrial fibrillation: Secondary | ICD-10-CM

## 2021-08-24 DIAGNOSIS — R001 Bradycardia, unspecified: Secondary | ICD-10-CM | POA: Diagnosis not present

## 2021-08-24 NOTE — Patient Instructions (Addendum)
Medication Instructions:  Your physician recommends that you continue on your current medications as directed. Please refer to the Current Medication list given to you today.  *If you need a refill on your cardiac medications before your next appointment, please call your pharmacy*   Lab Work: None ordered   Testing/Procedures:                           ZIO XT- Long Term Monitor Instructions  Your physician has requested you wear a ZIO patch monitor for 14 days.  This is a single patch monitor. [employer] supplies one patch monitor per enrollment. Additional stickers are not available. Please do not apply patch if you will be having a Nuclear Stress Test,  Echocardiogram, Cardiac CT, [name], or Chest Xray during the period you would be wearing the  monitor. The patch cannot be worn during these tests. You cannot remove and re-apply the  ZIO XT patch monitor.  Your ZIO patch monitor will be mailed 3 day USPS to your address on file. It may take 3-5 days  to receive your monitor after you have been enrolled.  Once you have received your monitor, please review the enclosed instructions. Your monitor  has already been registered assigning a specific monitor serial # to you.  Billing and Patient Assistance Program Information  We have supplied [employer] with any of your insurance information on file for billing purposes. [employer] offers a sliding scale Patient Assistance Program for patients that do not have  insurance, or whose insurance does not completely cover the cost of the ZIO monitor.  You must apply for the Patient Assistance Program to qualify for this discounted rate.  To apply, please call [employer] at [phone], select option 4, select option 2, ask to apply for  Patient Assistance Program. [employer] will ask your household income, and how many people  are in your household. They will quote your out-of-pocket cost based on that information.  [employer] will also be able to set up a  12-month, interest-free payment plan if needed.  Applying the monitor   Shave hair from upper left chest.  Hold abrader disc by orange tab. Rub abrader in 40 strokes over the upper left chest as  indicated in your monitor instructions.  Clean area with 4 enclosed alcohol pads. Let dry.  Apply patch as indicated in monitor instructions. Patch will be placed under collarbone on left  side of chest with arrow pointing upward.  Rub patch adhesive wings for 2 minutes. Remove white label marked "1". Remove the white  label marked "2". Rub patch adhesive wings for 2 additional minutes.  While looking in a mirror, press and release button in center of patch. A small green light will  flash 3-4 times. This will be your only indicator that the monitor has been turned on.  Do not shower for the first 24 hours. You may shower after the first 24 hours.  Press the button if you feel a symptom. You will hear a small click. Record Date, Time and  Symptom in the Patient Logbook.  When you are ready to remove the patch, follow instructions on the last 2 pages of Patient  Logbook. Stick patch monitor onto the last page of Patient Logbook.  Place Patient Logbook in the blue and white box. Use locking tab on box and tape box closed  securely. The blue and white box has prepaid postage on it. Please place it in the   mailbox as  soon as possible. Your physician should have your test results approximately 7 days after the  monitor has been mailed back to [facility].  Call [place] at [phone] if you have questions regarding  your ZIO XT patch monitor. Call them immediately if you see an orange light blinking on your  monitor.  If your monitor falls off in less than 4 days, contact our Monitor department at [phone].  If your monitor becomes loose or falls off after 4 days call [employer] at [phone] for  suggestions on securing your monitor    Follow-Up: At [facility],  you and your health needs are our priority.  As part of our continuing mission to provide you with exceptional heart care, we have created designated Provider Care Teams.  These Care Teams include your primary Cardiologist (physician) and Advanced Practice Providers (APPs -  Physician Assistants and Nurse Practitioners) who all work together to provide you with the care you need, when you need it.   Your next appointment:   6 - 8  week(s)  The format for your next appointment:   In Person  Provider:   Allegra Lai, MD    Thank you for choosing [facility]!!   Trinidad Curet, RN [phone]   Other Instructions   Amiodarone Tablets What is this medication? AMIODARONE (a MEE oh da rone) prevents and treats a fast or irregular heartbeat (arrhythmia). It works by slowing down overactive electric signals in the heart, which stabilizes your heart rhythm. It belongs to a group of medications called antiarrhythmics. This medicine may be used for other purposes; ask your health care provider or pharmacist if you have questions. COMMON BRAND NAME(S): Cordarone, Pacerone What should I tell my care team before I take this medication? They need to know if you have any of these conditions: Liver disease Lung disease Other heart problems Thyroid disease An unusual or allergic reaction to amiodarone, iodine, other medications, foods, dyes, or preservatives Pregnant or trying to get pregnant Breast-feeding How should I use this medication? Take this medication by mouth with a glass of water. Follow the directions on the prescription label. You can take this medication with or without food. However, you should always take it the same way each time. Take your doses at regular intervals. Do not take your medication more often than directed. Do not stop taking except on the advice of your care team. A special MedGuide will be given to you by the pharmacist with each prescription and refill. Be  sure to read this information carefully each time. Talk to your care team regarding the use of this medication in children. Special care may be needed. Overdosage: If you think you have taken too much of this medicine contact a poison control center or emergency room at once. NOTE: This medicine is only for you. Do not share this medicine with others. What if I miss a dose? If you miss a dose, take it as soon as you can. If it is almost time for your next dose, take only that dose. Do not take double or extra doses. What may interact with this medication? Do not take this medication with any of the following: Abarelix Apomorphine Arsenic trioxide Certain antibiotics like erythromycin, gemifloxacin, levofloxacin, pentamidine Certain medications for depression like amoxapine, tricyclic antidepressants Certain medications for fungal infections like fluconazole, itraconazole, ketoconazole, posaconazole, voriconazole Certain medications for irregular heartbeat like disopyramide, dronedarone, ibutilide, propafenone, sotalol Certain medications for malaria like chloroquine, halofantrine  Cisapride Droperidol Haloperidol [place] Maprotiline Methadone Phenothiazines like chlorpromazine, mesoridazine, thioridazine Pimozide Ranolazine Red yeast rice Vardenafil This medication may also interact with the following: Antiviral medications for HIV or AIDS Certain medications for blood pressure, heart disease, irregular heart beat Certain medications for cholesterol like atorvastatin, cerivastatin, lovastatin, simvastatin Certain medications for hepatitis C like sofosbuvir and ledipasvir; sofosbuvir Certain medications for seizures like phenytoin Certain medications for thyroid problems Certain medications that treat or prevent blood clots like warfarin Cholestyramine Cimetidine Clopidogrel Cyclosporine Dextromethorphan Diuretics Dofetilide Fentanyl General anesthetics Grapefruit  juice Lidocaine Loratadine Methotrexate Other medications that prolong the QT interval (cause an abnormal heart rhythm) Procainamide Quinidine Rifabutin, rifampin, or rifapentine St. John's Wort Trazodone Ziprasidone This list may not describe all possible interactions. Give your health care provider a list of all the medicines, herbs, non-prescription drugs, or dietary supplements you use. Also tell them if you smoke, drink alcohol, or use illegal drugs. Some items may interact with your medicine. What should I watch for while using this medication? Your condition will be monitored closely when you first begin therapy. Often, this medication is first started in a hospital or other monitored health care setting. Once you are on maintenance therapy, visit your care team for regular checks on your progress. Because your condition and use of this medication carry some risk, it is a good idea to carry an identification card, necklace or bracelet with details of your condition, medications, and care team. You may get drowsy or dizzy. Do not drive, use machinery, or do anything that needs mental alertness until you know how this medication affects you. Do not stand or sit up quickly, especially if you are an older patient. This reduces the risk of dizzy or fainting spells. This medication can make you more sensitive to the sun. Keep out of the sun. If you cannot avoid being in the sun, wear protective clothing and use sunscreen. Do not use sun lamps or tanning beds/booths. You should have regular eye exams before and during treatment. Call your care team if you have blurred vision, see halos, or your eyes become sensitive to light. Your eyes may get dry. It may be helpful to use a lubricating eye solution or artificial tears solution. If you are going to have surgery or a procedure that requires contrast dyes, tell your care team that you are taking this medication. What side effects may I notice from  receiving this medication? Side effects that you should report to your care team as soon as possible: Allergic reactions--skin rash, itching, hives, swelling of the face, lips, tongue, or throat Bluish-gray skin Change in vision such as blurry vision, seeing halos around lights, vision loss Heart failure--shortness of breath, swelling of the ankles, feet, or hands, sudden weight gain, unusual weakness or fatigue Heart rhythm changes--fast or irregular heartbeat, dizziness, feeling faint or lightheaded, chest pain, trouble breathing High thyroid levels (hyperthyroidism)--fast or irregular heartbeat, weight loss, excessive sweating or sensitivity to heat, tremors or shaking, anxiety, nervousness, irregular menstrual cycle or spotting Liver injury--right upper belly pain, loss of appetite, nausea, light-colored stool, dark yellow or brown urine, yellowing skin or eyes, unusual weakness or fatigue Low thyroid levels (hypothyroidism)--unusual weakness or fatigue, sensitivity to cold, constipation, hair loss, dry skin, weight gain, feelings of depression Lung injury--shortness of breath or trouble breathing, cough, spitting up blood, chest pain, fever Pain, tingling, or numbness in the hands or feet, muscle weakness, trouble walking, loss of balance or coordination Side effects that usually do not  require medical attention (report to your care team if they continue or are bothersome): Nausea Vomiting This list may not describe all possible side effects. Call your doctor for medical advice about side effects. You may report side effects to FDA at 1-800-FDA-1088. Where should I keep my medication? Keep out of the reach of children and pets. Store at room temperature between 20 and 25 degrees C (68 and 77 degrees F). Protect from light. Keep container tightly closed. Throw away any unused medication after the expiration date. NOTE: This sheet is a summary. It may not cover all possible information. If you  have questions about this medicine, talk to your doctor, pharmacist, or health care provider.  2022 Elsevier/Gold Standard (2020-11-20 00:00:00)    Cardiac Ablation Cardiac ablation is a procedure to destroy (ablate) some heart tissue that is sending bad signals. These bad signals cause problems in heart rhythm. The heart has many areas that make these signals. If there are problems in these areas, they can make the heart beat in a way that is not normal. Destroying some tissues can help make the heart rhythm normal. Tell your doctor about: Any allergies you have. All medicines you are taking. These include vitamins, herbs, eye drops, creams, and over-the-counter medicines. Any problems you or family members have had with medicines that make you fall asleep (anesthetics). Any blood disorders you have. Any surgeries you have had. Any medical conditions you have, such as kidney failure. Whether you are pregnant or may be pregnant. What are the risks? This is a safe procedure. But problems may occur, including: Infection. Bruising and bleeding. Bleeding into the chest. Stroke or blood clots. Damage to nearby areas of your body. Allergies to medicines or dyes. The need for a pacemaker if the normal system is damaged. Failure of the procedure to treat the problem. What happens before the procedure? Medicines Ask your doctor about: Changing or stopping your normal medicines. This is important. Taking aspirin and ibuprofen. Do not take these medicines unless your doctor tells you to take them. Taking other medicines, vitamins, herbs, and supplements. General instructions Follow instructions from your doctor about what you cannot eat or drink. Plan to have someone take you home from the hospital or clinic. If you will be going home right after the procedure, plan to have someone with you for 24 hours. Ask your doctor what steps will be taken to prevent infection. What happens during the  procedure?  An IV tube will be put into one of your veins. You will be given a medicine to help you relax. The skin on your neck or groin will be numbed. A cut (incision) will be made in your neck or groin. A needle will be put through your cut and into a large vein. A tube (catheter) will be put into the needle. The tube will be moved to your heart. Dye may be put through the tube. This helps your doctor see your heart. Small devices (electrodes) on the tube will send out signals. A type of energy will be used to destroy some heart tissue. The tube will be taken out. Pressure will be held on your cut. This helps stop bleeding. A bandage will be put over your cut. The exact procedure may vary among doctors and hospitals. What happens after the procedure? You will be watched until you leave the hospital or clinic. This includes checking your heart rate, breathing rate, oxygen, and blood pressure. Your cut will be watched for bleeding. You  will need to lie still for a few hours. Do not drive for 24 hours or as long as your doctor tells you. Summary Cardiac ablation is a procedure to destroy some heart tissue. This is done to treat heart rhythm problems. Tell your doctor about any medical conditions you may have. Tell him or her about all medicines you are taking to treat them. This is a safe procedure. But problems may occur. These include infection, bruising, bleeding, and damage to nearby areas of your body. Follow what your doctor tells you about food and drink. You may also be told to change or stop some of your medicines. After the procedure, do not drive for 24 hours or as long as your doctor tells you. This information is not intended to replace advice given to you by your health care provider. Make sure you discuss any questions you have with your health care provider. Document Revised: 08/29/2019 Document Reviewed: 08/29/2019 Elsevier Patient Education  2022 [employer].   Pacemaker Implantation, Adult Pacemaker implantation is a procedure to place a pacemaker inside the chest. A pacemaker is a small computer that sends electrical signals to the heart and helps the heart beat normally. A pacemaker also stores information about heart rhythms. You may need pacemaker implantation if you have: A slow heartbeat (bradycardia). Loss of consciousness that happens repeatedly (syncope) or repeated episodes of dizziness or light-headedness because of an irregular heart rate. Shortness of breath (dyspnea) due to heart problems. The pacemaker usually attaches to your heart through a wire called a lead. One or two leads may be needed. There are different types of pacemakers: Transvenous pacemaker. This type is placed under the skin or muscle of your upper chest area. The lead goes through a vein in the chest area to reach the inside of the heart. Epicardial pacemaker. This type is placed under the skin or muscle of your chest or abdomen. The lead goes through your chest to the outside of the heart. Tell a health care provider about: Any allergies you have. All medicines you are taking, including vitamins, herbs, eye drops, creams, and over-the-counter medicines. Any problems you or family members have had with anesthetic medicines. Any blood or bone disorders you have. Any surgeries you have had. Any medical conditions you have. Whether you are pregnant or may be pregnant. What are the risks? Generally, this is a safe procedure. However, problems may occur, including: Infection. Bleeding. Failure of the pacemaker or the lead. Collapse of a lung or bleeding into a lung. Blood clot inside a blood vessel with a lead. Damage to the heart. Infection inside the heart (endocarditis). Allergic reactions to medicines. What happens before the procedure? Staying hydrated Follow instructions from your health care provider about hydration, which may include: Up to 2 hours  before the procedure - you may continue to drink clear liquids, such as water, clear fruit juice, black coffee, and plain tea.  Eating and drinking restrictions Follow instructions from your health care provider about eating and drinking, which may include: 8 hours before the procedure - stop eating heavy meals or foods, such as meat, fried foods, or fatty foods. 6 hours before the procedure - stop eating light meals or foods, such as toast or cereal. 6 hours before the procedure - stop drinking milk or drinks that contain milk. 2 hours before the procedure - stop drinking clear liquids. Medicines Ask your health care provider about: Changing or stopping your regular medicines. This is especially important if  you are taking diabetes medicines or blood thinners. Taking medicines such as aspirin and ibuprofen. These medicines can thin your blood. Do not take these medicines unless your health care provider tells you to take them. Taking over-the-counter medicines, vitamins, herbs, and supplements. Tests You may have: A heart evaluation. This may include: An electrocardiogram (ECG). This involves placing patches on your skin to check your heart rhythm. A chest X-ray. An echocardiogram. This is a test that uses sound waves (ultrasound) to produce an image of the heart. A cardiac rhythm monitor. This is used to record your heart rhythm and any events for a longer period of time. Blood tests. Genetic testing. General instructions Do not use any products that contain nicotine or tobacco for at least 4 weeks before the procedure. These products include cigarettes, e-cigarettes, and chewing tobacco. If you need help quitting, ask your health care provider. Ask your health care provider: How your surgery site will be marked. What steps will be taken to help prevent infection. These steps may include: Removing hair at the surgery site. Washing skin with a germ-killing soap. Receiving antibiotic  medicine. Plan to have someone take you home from the hospital or clinic. If you will be going home right after the procedure, plan to have someone with you for 24 hours. What happens during the procedure? An IV will be inserted into one of your veins. You will be given one or more of the following: A medicine to help you relax (sedative). A medicine to numb the area (local anesthetic). A medicine to make you fall asleep (general anesthetic). The next steps vary depending on the type of pacemaker you will be getting. If you are getting a transvenous pacemaker: An incision will be made in your upper chest. A pocket will be made for the pacemaker. It may be placed under the skin or between layers of muscle. The lead will be inserted into a blood vessel that goes to the heart. While X-rays are taken by an imaging machine (fluoroscopy), the lead will be advanced through the vein to the inside of your heart. The other end of the lead will be tunneled under the skin and attached to the pacemaker. If you are getting an epicardial pacemaker: An incision will be made near your ribs or breastbone (sternum) for the lead. The lead will be attached to the outside of your heart. Another incision will be made in your chest or upper abdomen to create a pocket for the pacemaker. The free end of the lead will be tunneled under the skin and attached to the pacemaker. The transvenous or epicardial pacemaker will be tested. Imaging studies may be done to check the lead position. The incisions will be closed with stitches (sutures), adhesive strips, or skin glue. Bandages (dressings) will be placed over the incisions. The procedure may vary among health care providers and hospitals. What happens after the procedure? Your blood pressure, heart rate, breathing rate, and blood oxygen level will be monitored until you leave the hospital or clinic. You may be given antibiotics. You will be given pain medicine. An  ECG and chest X-rays will be done. You may need to wear a continuous type of ECG (Holter monitor) to check your heart rhythm. Your health care provider will program the pacemaker. If you were given a sedative during the procedure, it can affect you for several hours. Do not drive or operate machinery until your health care provider says that it is safe. You will be given  a pacemaker identification card. This card lists the implant date, device model, and manufacturer of your pacemaker. Summary A pacemaker is a small computer that sends electrical signals to the heart and helps the heart beat normally. There are different types of pacemakers. A pacemaker may be placed under the skin or muscle of your chest or abdomen. Follow instructions from your health care provider about eating and drinking and about taking medicines before the procedure. This information is not intended to replace advice given to you by your health care provider. Make sure you discuss any questions you have with your health care provider. Document Revised: 06/08/2021 Document Reviewed: 08/28/2019 Elsevier Patient Education  2022 [employer].

## 2021-08-24 NOTE — Progress Notes (Unsigned)
Patient enrolled for [employer] to mail a 14 day ZIO XT monitor to her address on file.

## 2021-08-24 NOTE — Progress Notes (Signed)
Electrophysiology Office Note   Date:  08/24/2021   ID:  Jacqueline, [name] 08/16/1941, MRN [id number]  PCP:  [name], MD  Cardiologist:  Oval Linsey Primary Electrophysiologist:  Lilas Diefendorf Meredith Leeds, MD    Chief Complaint: atrial fibrillation   History of Present Illness: [name] is a 80 y.o. female who is being seen today for the evaluation of AF at the request of Jacqueline Latch, MD. Presenting today for electrophysiology evaluation.  She has a history significant for persistent atrial fibrillation status post ablation, bradycardia, CKD stage II, mitral regurgitation, aortic regurgitation, hypertension, hyperlipidemia.  She had an echo in 2020 that showed a normal ejection fraction with grade 2 diastolic dysfunction.  She is followed in the hypertension clinic.  She presented to the hospital 08/17/2021.  She was found to be in atrial fibrillation with rates in the 90s to 150s.  She was given diltiazem and metoprolol.  Her rate improved with IV diltiazem.  She was started on metoprolol 12.5 mg twice daily.  Unfortunately she feels weak and tired.  She does not have any more palpitations.  Today, she denies symptoms of palpitations, chest pain, shortness of breath, orthopnea, PND, lower extremity edema, claudication, dizziness, presyncope, syncope, bleeding, or neurologic sequela. The patient is tolerating medications without difficulties.  Today she complains of feeling tired and fatigued.  Her daughter is here with her states that her heart rates 40s a good amount of the time.  She has not noted atrial fibrillation since she converted a few weeks ago.  I was her first episode of atrial fibrillation in quite some time.  She had an ablation at [facility] potentially 10 years ago.   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 cath 2/09:  no CAD, EF 70%   GERD (gastroesophageal reflux disease) 07/26/2021   HYPERLIPIDEMIA    HYPERTENSION    Mild Aortic insufficiency    Moderate mitral regurgitation    Moderate tricuspid regurgitation    OSTEOPENIA    PAF (paroxysmal atrial fibrillation) ([name])    a. s/p ablation 2013; b. CHADS2VASc -> 4 (HTN, age x 2, female)-->Eliquis.   PAT (paroxysmal atrial tachycardia) ([name])    a. 04/2019 Zio: Occas PACs and rare PVCs. 21 atrial runs - longest 20 beats, max rate 169.   Pneumonia 2009   RA (rheumatoid arthritis) (H[facility])    Sinus Bradycardia    a. asymptomatic but prevents use of AVN blocking agents; b. 04/2019 Zio: Avg HR 61 (37-109).   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 05/2019 Echo: EF 60-65%. DD. RVSP 41.46mmHg. Mod dil LA. Mod MR/TR. Mild AI.   Past Surgical History:  Procedure Laterality Date   ABLATION OF DYSRHYTHMIC FOCUS     CARDIAC CATHETERIZATION     COLONOSCOPY  04/18/08   Partial hysterectomy--1979     Thoracentesis   12/20/07       Current Outpatient Medications  Medication Sig Dispense Refill   B Complex Vitamins (VITAMIN B COMPLEX) TABS Take 1 tablet by mouth daily.      chlorthalidone (HYGROTON) 25 MG tablet Take 25 mg by mouth as directed. Take 1/2 tablet daily     cholecalciferol (VITAMIN D3) 25 MCG (1000 UNIT) tablet Take 1,000 Units by mouth daily.     ELIQUIS  5 MG TABS tablet TAKE 1 TABLET BY MOUTH TWICE DAILY 180 tablet 1   losartan (COZAAR) 100 MG tablet TAKE 1 TABLET BY MOUTH ONCE DAILY 90 tablet 2   LUMIGAN 0.01 % SOLN Place 1 drop into both eyes 2 (two) times daily.     metoprolol tartrate (LOPRESSOR) 25 MG tablet TAKE 1/2 TABLET AS NEEDED FOR AFIB 30 tablet 1   pantoprazole (PROTONIX) 40 MG tablet Take 1 tablet (40 mg total) by mouth daily. 30 tablet 11   rosuvastatin (CRESTOR) 10 MG tablet Take 1 tablet (10 mg total) by mouth daily. 90 tablet 3   No current facility-administered medications for this visit.    Allergies:    Amlodipine, Celecoxib, and Tramadol   Social History:  The patient  reports that she has never smoked. She has never used smokeless tobacco. She reports that she does not drink alcohol and does not use drugs.   Family History:  The patient's family history includes Atrial fibrillation in her brother; Breast cancer (age of onset: 57) in her paternal aunt; Diabetes in an other family member; Heart attack in her brother; Heart disease in her brother and mother; Pancreatic cancer in her father.    ROS:  Please see the history of present illness.   Otherwise, review of systems is positive for none.   All other systems are reviewed and negative.    PHYSICAL EXAM: VS:  BP 134/80   Pulse 60   Ht 5\' 3"  (1.6 m)   Wt 142 lb (64.4 kg)   SpO2 97%   BMI 25.15 kg/m  , BMI Body mass index is 25.15 kg/m. GEN: Well nourished, well developed, in no acute distress  HEENT: normal  Neck: no JVD, carotid bruits, or masses Cardiac: RRR; no murmurs, rubs, or gallops,no edema  Respiratory:  clear to auscultation bilaterally, normal work of breathing GI: soft, nontender, nondistended, + BS MS: no deformity or atrophy  Skin: warm and dry Neuro:  Strength and sensation are intact Psych: euthymic mood, full affect  EKG:  EKG is not ordered today. Personal review of the ekg ordered 08/19/21 shows atrial fibrillation, rate 129  Recent Labs: 05/25/2021: ALT 11 08/17/2021: B Natriuretic Peptide 353.9; Hemoglobin 11.0; Platelets 177; TSH 2.214 08/18/2021: BUN 39; Creatinine, Ser 1.77; Magnesium 2.0; Potassium 3.9; Sodium 139    Lipid Panel     Component Value Date/Time   CHOL 205 (H) 08/18/2021 0325   TRIG 47 08/18/2021 0325   HDL 78 08/18/2021 0325   CHOLHDL 2.6 08/18/2021 0325   VLDL 9 08/18/2021 0325   LDLCALC 118 (H) 08/18/2021 0325   LDLDIRECT 104.4 11/26/2012 0746     Wt Readings from Last 3 Encounters:  08/24/21 142 lb (64.4 kg)  08/19/21 138 lb 9.6 oz (62.9 kg)  08/04/21 141 lb 9.6 oz (64.2  kg)      Other studies Reviewed: Additional studies/ records that were reviewed today include: TTE 05/14/19  Review of the above records today demonstrates:   1. The left ventricle has normal systolic function with an ejection  fraction of 60-65%. The cavity size was normal. There is mildly increased  left ventricular wall thickness. Left ventricular diastolic Doppler  parameters are consistent with  pseudonormalization.   2. The right ventricle has normal systolic function. The cavity was  normal. There is no increase in right ventricular wall thickness. Right  ventricular systolic pressure is mildly elevated with an estimated  pressure of 41.7 mmHg.   3. Left atrial  size was moderately dilated.   4. Incompletely evaluated hypoechoic structure noted in the liver, most  likely a cyst.   5. The mitral valve is degenerative. Mild thickening of the mitral valve  leaflet. Mitral valve regurgitation is moderate by color flow Doppler.   6. Tricuspid valve regurgitation is moderate.   7. The aortic valve has an indeterminate number of cusps. Mild thickening  of the aortic valve. Mild calcification of the aortic valve. Aortic valve  regurgitation is mild by color flow Doppler.   8. The aorta is normal in size and structure.   ASSESSMENT AND PLAN:  1.  Persistent atrial fibrillation: Fortunately she is back in normal rhythm.  Persistent atrial fibrillation: Currently on Eliquis 5 mg twice daily.  CHA2DS2-VASc of at least 4.  Unfortunately, she does have CKD.  This would limit the use of dofetilide.  I discussed with her further options including amiodarone versus ablation.  Her and her family Ceola Para discuss this further and let us know.  2.  Hypertension: Follows in the hypertension clinic.  We Sonnie Pawloski continue with current management.  3.  Sick sinus syndrome has had bradycardia with metoprolol but has unfortunately had rapid episodes of atrial fibrillation.  We Rayfield Beem work to avoid further rate  controlling medication.  She does have symptoms of sick sinus syndrome and bradycardia found on home monitors.  We Jennie Hannay fit her with a 2-week monitor to further determine what her overall heart rates are.  If they are slow and she is symptomatic, pacemaker implant may be indicated.  4.  Hyperlipidemia: Continue Crestor per primary cardiology.  Case discussed with primary cardiology  Current medicines are reviewed at length with the patient today.   The patient does not have concerns regarding her medicines.  The following changes were made today:  none  Labs/ tests ordered today include:  Orders Placed This Encounter  Procedures   LONG TERM MONITOR (3-14 DAYS)     Disposition:   FU with Shiraz Bastyr 8 weeks  Signed, Zlaty Alexa Meredith Leeds, MD  08/24/2021 10:51 AM     [address] [place] [id number] [phone] (office) [phone] (fax)

## 2021-08-26 DIAGNOSIS — R001 Bradycardia, unspecified: Secondary | ICD-10-CM | POA: Diagnosis not present

## 2021-08-26 DIAGNOSIS — I4819 Other persistent atrial fibrillation: Secondary | ICD-10-CM

## 2021-08-27 ENCOUNTER — Telehealth (INDEPENDENT_AMBULATORY_CARE_PROVIDER_SITE_OTHER): Payer: Medicare Other | Admitting: Cardiovascular Disease

## 2021-08-27 ENCOUNTER — Ambulatory Visit: Payer: Medicare Other | Admitting: Family

## 2021-08-27 ENCOUNTER — Encounter (HOSPITAL_BASED_OUTPATIENT_CLINIC_OR_DEPARTMENT_OTHER): Payer: Self-pay | Admitting: Cardiovascular Disease

## 2021-08-27 DIAGNOSIS — I4819 Other persistent atrial fibrillation: Secondary | ICD-10-CM

## 2021-08-27 DIAGNOSIS — I495 Sick sinus syndrome: Secondary | ICD-10-CM

## 2021-08-27 DIAGNOSIS — I10 Essential (primary) hypertension: Secondary | ICD-10-CM | POA: Diagnosis not present

## 2021-08-27 DIAGNOSIS — E782 Mixed hyperlipidemia: Secondary | ICD-10-CM | POA: Diagnosis not present

## 2021-08-27 NOTE — Patient Instructions (Addendum)
Medication Instructions:  Your physician recommends that you continue on your current medications as directed. Please refer to the Current Medication list given to you today.   *If you need a refill on your cardiac medications before your next appointment, please call your pharmacy*  Lab Work: NONE  Testing/Procedures: NONE  Follow-Up: 12/02/2021 AT 9:00 AM WITH DR [facility]

## 2021-08-27 NOTE — Progress Notes (Signed)
Cardiology Office Note:    Date:  10/14/2021   ID:  [name], DOB 12/16/1940, MRN [id number]  PCP:  Jacqueline Sanders, MD  Cardiologist:  Jacqueline Latch, MD   Referring MD: Jacqueline Sanders, MD   CC: Hypertension  History of Present Illness:    [name] is a 80 y.o. female with a hx of persistent atrial fibrillation status post ablation, bradycardia, CKD 2, mitral regurgitation, aortic regurgitation, hypertension, and hyperlipidemia here for follow up. She had an echo 05/14/2019 that revealed LVEF 60 to 65% with grade 2 diastolic dysfunction and mildly elevated pulmonary pressures.  She was initially seen 12/2020 to establish care in the hypertension clinic.  She was first diagnosed with hypertension many decades ago.  In the past it was well-controlled.  However in the last year she has struggled.  Lately her blood pressure has been in the 160s at home.  She last saw Dr. Saunders [name] on 11/22/2019.  Prior to that losartan was increased to 50 mg.  Her blood pressure remained above goal since making that change.  Dr. Saunders [name] increased losartan to 100mg .  She followed up with Jacqueline Montana, NP, and hydralazine 25mg  bid was added on 2/26.  At her first visit hydralazine was increased.  She saw Jacqueline Bayley, NP on 12/2019 an dit was increased to 75mg .  She started to work out at the [employer].  She was seen in the ED with palpitations 03/2020.  She was in sinus rhythm at the time.  This has resolved.   It was noted that she had inadvertently stopped taking her hydrochlorothiazide. She this was resumed when she saw her pharmacist on 03/2020.  Hydralazine was reduced back to 50 mg twice daily.  HCTZ was changed to chlorthalidone. Her lipids were poorly controlled so she was started to rosuvastatin. 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  At her last appointment she reported palpitations but was noted to be in sinus rhythm at that time.  Her renal function was worsening so chlorthalidone was  reduced and she was started on clonidine.  Renal artery Dopplers were ordered but have not yet been completed to rule out renal artery stenosis.  08/2021 she was seen in the hospital.  She reported shortness of breath and chest pain.  She also had increasing lower extremity edema.  She was noted to be in atrial fibrillation with RVR in the 90s to 150s.  She was given IV diltiazem and IV metoprolol.  High-sensitivity troponin was minimally elevated 22 and flat.  It was thought to be demand ischemia in the setting of A. fib with RVR.  With the initiation of IV diltiazem her heart rate quickly improved.  She was started on metoprolol 12.5 mg twice daily.  Her home hydralazine was held and she was asked to follow-up closely with cardiology. When she followed up in clinic she was bradycardic.  Clonidine and metoprolol were both discontinued she was instructed to take the metoprolol only as needed.  She was continued on losartan and chlorthalidone.  She followed up with EP and remained in sinus rhythm.  Given her chronic kidney disease she was not a candidate for dofetilide.  They discussed ablation versus amiodarone.  She was started on a 2-week monitor to better assess her heart rate and rhythm.  Lately she has been feeling much better.  Her blood pressures been in the 120s to 130s.  Her heart rate has been consistently in  the 40s.  She has no lightheadedness or dizziness and has otherwise been well.  She denies any chest pain or shortness of breath.  She has no lower extremity edema, orthopnea, or PND.  Previous antihypertensives: Amlodipine- ankle swelling Spironolactone- unsure why it was stopped Hydralazine- swelling with 100mg , OK with 50mg  Clonidine- worked well.  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 cath 2/09: no CAD, EF 70%   GERD (gastroesophageal reflux disease) 07/26/2021   HYPERLIPIDEMIA    HYPERTENSION    Mild Aortic insufficiency    Moderate mitral regurgitation    Moderate tricuspid regurgitation    OSTEOPENIA    PAF (paroxysmal atrial fibrillation) ([place])    a. s/p ablation 2013; b. CHADS2VASc -> 4 (HTN, age x 2, female)-->Eliquis.   PAT (paroxysmal atrial tachycardia) ([place])    a. 04/2019 Zio: Occas PACs and rare PVCs. 21 atrial runs - longest 20 beats, max rate 169.   Pneumonia 2009   RA (rheumatoid arthritis) (H[facility])    Sinus Bradycardia    a. asymptomatic but prevents use of AVN blocking agents; b. 04/2019 Zio: Avg HR 61 (37-109).   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 05/2019 Echo: EF 60-65%. DD. RVSP 41.38mmHg. Mod dil LA. Mod MR/TR. Mild AI.    Past Surgical History:  Procedure Laterality Date   ABLATION OF DYSRHYTHMIC FOCUS     CARDIAC CATHETERIZATION     COLONOSCOPY  04/18/08   Partial hysterectomy--1979     Thoracentesis   12/20/07      Current Medications: Current Meds  Medication Sig   B Complex Vitamins (VITAMIN B COMPLEX) TABS Take 1 tablet by mouth daily.    chlorthalidone (HYGROTON) 25 MG tablet Take 25 mg by mouth as directed. Take 1/2 tablet daily   cholecalciferol (VITAMIN D3) 25 MCG (1000 UNIT) tablet Take 1,000 Units by mouth daily.   losartan (COZAAR) 100 MG tablet TAKE 1 TABLET BY MOUTH ONCE DAILY   LUMIGAN 0.01 % SOLN Place 1 drop into both eyes 2 (two) times daily.   metoprolol tartrate (LOPRESSOR) 25 MG tablet TAKE 1/2 TABLET AS NEEDED FOR AFIB   pantoprazole (PROTONIX) 40 MG tablet Take 1 tablet (40 mg total) by mouth daily.   rosuvastatin (CRESTOR) 10 MG tablet Take 1 tablet (10 mg total) by mouth daily.   [DISCONTINUED] ELIQUIS 5 MG TABS tablet TAKE 1 TABLET BY MOUTH TWICE DAILY     Allergies:   Amlodipine, Celecoxib, and Tramadol   Social History   Socioeconomic History   Marital status: Married    Spouse  name: Not on file   Number of children: Not on file   Years of education: Not on file   Highest education level: Not on file  Occupational History   Not on file  Tobacco Use   Smoking status: Never   Smokeless tobacco: Never  Vaping Use   Vaping Use: Never used  Substance and Sexual Activity   Alcohol use: No    Alcohol/week: 0.0 standard drinks   Drug use: No   Sexual activity: Never    Birth control/protection: None  Other Topics Concern   Not on file  Social History Narrative   Marital Status: widow x 1 yr  Children: 5, grandchildren 47, numerous great grand children   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 Full Code ( reviewed 2015)       Occasional exercise.   Diet: fruits and veggies, lean meats.   Social Determinants of Health   Financial Resource Strain: Not on file  Food Insecurity: Not on file  Transportation Needs: Not on file  Physical Activity: Not on file  Stress: Not on file  Social Connections: Not on file     Family History: The patient's family history includes Atrial fibrillation in her brother; Breast cancer (age of onset: 23) in her paternal aunt; Diabetes in an other family member; Heart attack in her brother; Heart disease in her brother and mother; Pancreatic cancer in her father.  ROS:   Please see the history of present illness.    (+) Palpitations (+) Acid Reflux All other systems reviewed and are negative.  EKGs/Labs/Other Studies Reviewed:    EKG:     07/26/2021: Sinus rhythm Rate 65 03/23/2021: EKG is not ordered today. 11/27/2020: Sinus rhythm.  Rate 60 bpm. 12/06/19: sinus bradycardia.  Rate 53 bpm.  Non-specific ST-T changes. 08/19/2021: Ectopic atrial  bradycardia.  Rate 47 bpm.   Echo 05/14/19:  1. The left ventricle has normal systolic function with an ejection  fraction of 60-65%. The cavity size was normal. There is mildly increased  left ventricular wall thickness. Left ventricular diastolic Doppler  parameters are consistent with  pseudonormalization.   2. The right ventricle has normal systolic function. The cavity was  normal. There is no increase in right ventricular wall thickness. Right  ventricular systolic pressure is mildly elevated with an estimated  pressure of 41.7 mmHg.   3. Left atrial size was moderately dilated.   4. Incompletely evaluated hypoechoic structure noted in the liver, most  likely a cyst.   5. The mitral valve is degenerative. Mild thickening of the mitral valve  leaflet. Mitral valve regurgitation is moderate by color flow Doppler.   6. Tricuspid valve regurgitation is moderate.   7. The aortic valve has an indeterminate number of cusps. Mild thickening  of the aortic valve. Mild calcification of the aortic valve. Aortic valve  regurgitation is mild by color flow Doppler.   8. The aorta is normal in size and structure.   Recent Labs: 05/25/2021: ALT 11 08/17/2021: B Natriuretic Peptide 353.9; Hemoglobin 11.0; Platelets 177; TSH 2.214 08/18/2021: BUN 39; Creatinine, Ser 1.77; Magnesium 2.0; Potassium 3.9; Sodium 139   Recent Lipid Panel    Component Value Date/Time   CHOL 205 (H) 08/18/2021 0325   TRIG 47 08/18/2021 0325   HDL 78 08/18/2021 0325   CHOLHDL 2.6 08/18/2021 0325   VLDL 9 08/18/2021 0325   LDLCALC 118 (H) 08/18/2021 0325   LDLDIRECT 104.4 11/26/2012 0746    Physical Exam:    VS:  BP 135/65   Pulse (!) 44   Ht 5\' 3"  (1.6 m)   Wt 140 lb (63.5 kg)   BMI 24.80 kg/m  , BMI Body mass index is 24.8 kg/m. GENERAL:  Well appearing HEENT: Pupils equal round and reactive, fundi not visualized, oral mucosa unremarkable NECK:  No jugular venous distention, waveform within normal limits,  carotid upstroke brisk and symmetric, no bruits LUNGS:  Clear to  auscultation bilaterally HEART:  Bradycardic.  Regular rhythm  PMI not displaced or sustained,S1 and S2 within normal limits, no S3, no S4, no clicks, no rubs, III/VI systolic murmur at the left upper sternal border. ABD:  Flat, positive bowel sounds normal in frequency in pitch, no bruits, no rebound, no guarding, no midline pulsatile mass, no hepatomegaly, no splenomegaly EXT:  2 plus pulses throughout, no edema, no cyanosis no clubbing SKIN:  No rashes no nodules NEURO:  Cranial nerves II through XII grossly intact, motor grossly intact throughout PSYCH:  Cognitively intact, oriented to person place and time   ASSESSMENT:    1. SINUS BRADYCARDIA   2. Essential hypertension   3. HYPERLIPIDEMIA   4. Persistent atrial fibrillation (HCC)        PLAN:    SINUS BRADYCARDIA She remains bradycardic. Today she is asymptomatic.  Continue to hold metoprolol.  No PPM at this time.  Essential hypertension Blood pressure has been much more controlled on her current regimen.  Continue chlorthalidone, losartan, and avoid beta-blocker unless she goes into A. fib with RVR.  HYPERLIPIDEMIA Continue rosuvastatin.  Persistent atrial fibrillation Currently back in sinus rhythm.  She likely has sick sinus syndrome and oscillates between A. fib with RVR and sinus bradycardia.  Continue to follow-up with [name] as scheduled.      Disposition: FU with Kamrie Fanton C. Oval Linsey, MD, [facility] 11/2021 as scheduled   Medication Adjustments/Labs and Tests Ordered: Current medicines are reviewed at length with the patient today.  Concerns regarding medicines are outlined above.  No orders of the defined types were placed in this encounter.     No orders of the defined types were placed in this encounter.      Signed, Jacqueline Latch, MD  10/14/2021 5:32 PM    [place]

## 2021-09-03 ENCOUNTER — Other Ambulatory Visit: Payer: Self-pay | Admitting: Internal Medicine

## 2021-09-06 NOTE — Telephone Encounter (Signed)
Eliquis 5mg  refill request received. Patient is 80 years old, weight-63.5kg, Crea-1.77 on 08/18/2021, Diagnosis-Afib, and last seen by [name] on 08/24/21 and Dr. Oval Linsey on 08/27/21 via Telemedicine. Dose is appropriate based on dosing criteria. Will send in refill to requested pharmacy.

## 2021-09-06 NOTE — Telephone Encounter (Signed)
Refill request

## 2021-09-08 ENCOUNTER — Ambulatory Visit (INDEPENDENT_AMBULATORY_CARE_PROVIDER_SITE_OTHER): Payer: Medicare Other

## 2021-09-08 ENCOUNTER — Other Ambulatory Visit: Payer: Self-pay

## 2021-09-08 DIAGNOSIS — I10 Essential (primary) hypertension: Secondary | ICD-10-CM | POA: Diagnosis not present

## 2021-09-14 ENCOUNTER — Telehealth: Payer: Self-pay | Admitting: Family Medicine

## 2021-09-14 NOTE — Telephone Encounter (Signed)
Pt called in requesting a copy of her Covid vaccine . Would like a call back [phone]

## 2021-09-16 DIAGNOSIS — I4819 Other persistent atrial fibrillation: Secondary | ICD-10-CM | POA: Diagnosis not present

## 2021-09-16 DIAGNOSIS — R001 Bradycardia, unspecified: Secondary | ICD-10-CM | POA: Diagnosis not present

## 2021-09-17 NOTE — Telephone Encounter (Signed)
Copy of Covid Vaccines printed off [facility] and mailed to patient per request.

## 2021-09-17 NOTE — Telephone Encounter (Signed)
Pt called in would like to know status of getting copy of Covid vaccine [phone]

## 2021-09-22 ENCOUNTER — Ambulatory Visit (HOSPITAL_BASED_OUTPATIENT_CLINIC_OR_DEPARTMENT_OTHER): Payer: Medicare Other | Admitting: Cardiovascular Disease

## 2021-09-28 ENCOUNTER — Ambulatory Visit: Payer: Medicare Other

## 2021-10-01 ENCOUNTER — Other Ambulatory Visit (HOSPITAL_BASED_OUTPATIENT_CLINIC_OR_DEPARTMENT_OTHER): Payer: Self-pay | Admitting: Cardiovascular Disease

## 2021-10-12 ENCOUNTER — Ambulatory Visit: Payer: Medicare Other | Admitting: Cardiology

## 2021-10-12 ENCOUNTER — Encounter: Payer: Self-pay | Admitting: Cardiology

## 2021-10-12 ENCOUNTER — Other Ambulatory Visit: Payer: Self-pay

## 2021-10-12 VITALS — BP 162/76 | HR 48 | Ht 63.0 in | Wt 146.0 lb

## 2021-10-12 DIAGNOSIS — I4819 Other persistent atrial fibrillation: Secondary | ICD-10-CM | POA: Diagnosis not present

## 2021-10-12 DIAGNOSIS — E785 Hyperlipidemia, unspecified: Secondary | ICD-10-CM | POA: Diagnosis not present

## 2021-10-12 DIAGNOSIS — I495 Sick sinus syndrome: Secondary | ICD-10-CM

## 2021-10-12 DIAGNOSIS — I10 Essential (primary) hypertension: Secondary | ICD-10-CM

## 2021-10-12 MED ORDER — AMIODARONE HCL 200 MG PO TABS: ORAL_TABLET | ORAL | 3 refills | Status: DC

## 2021-10-12 NOTE — Progress Notes (Signed)
Electrophysiology Office Note   Date:  10/12/2021   ID:  Jalina, Blowers 12/09/1940, MRN [id number]  PCP:  [name], MD  Cardiologist:  Oval Linsey Primary Electrophysiologist:  Jahaan Vanwagner Meredith Leeds, MD    Chief Complaint: atrial fibrillation   History of Present Illness: [name] is a 81 y.o. female who is being seen today for the evaluation of AF at the request of [name], MD. Presenting today for electrophysiology evaluation.  She has a history similar for persistent atrial fibrillation status post ablation, bradycardia, CKD stage II, mitral regurgitation, aortic regurgitation, hypertension, hyperlipidemia.  She had an echo in 2020 that showed a normal ejection fraction with grade 2 diastolic dysfunction.  She is followed in hypertension clinic.  She presented to the hospital 08/17/2021 and was found to be in atrial fibrillation with rates in the 90s to 150s.  She was started on diltiazem and metoprolol.  Heart rate improved with diltiazem.  She had an ablation at [facility] potentially 10 years ago.  Since being seen, she is continued to have episodes of atrial fibrillation.  She is in sinus rhythm today and feeling well.  She did have bradycardia and wore a cardiac monitor that showed average heart rates in the 50s.  Today, denies symptoms of palpitations, chest pain, shortness of breath, orthopnea, PND, lower extremity edema, claudication, dizziness, presyncope, syncope, bleeding, or neurologic sequela. The patient is tolerating medications without difficulties.  Today she feels well.  She continues to be quite active.  She overall has no complaints at this time.  She did have continued episodes of atrial fibrillation but has been in sinus rhythm for most of the time since being seen last.   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 cath 2/09: no CAD, EF 70%   GERD (gastroesophageal reflux disease) 07/26/2021   HYPERLIPIDEMIA    HYPERTENSION    Mild Aortic insufficiency    Moderate mitral regurgitation    Moderate tricuspid regurgitation    OSTEOPENIA    PAF (paroxysmal atrial fibrillation) ([place])    a. s/p ablation 2013; b. CHADS2VASc -> 4 (HTN, age x 2, female)-->Eliquis.   PAT (paroxysmal atrial tachycardia) ([place])    a. 04/2019 Zio: Occas PACs and rare PVCs. 21 atrial runs - longest 20 beats, max rate 169.   Pneumonia 2009   RA (rheumatoid arthritis) ([facility])    Sinus Bradycardia    a. asymptomatic but prevents use of AVN blocking agents; b. 04/2019 Zio: Avg HR 61 (37-109).   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 05/2019 Echo: EF 60-65%. DD. RVSP 41.32mmHg. Mod dil LA. Mod MR/TR. Mild AI.   Past Surgical History:  Procedure Laterality Date   ABLATION OF DYSRHYTHMIC FOCUS     CARDIAC CATHETERIZATION     COLONOSCOPY  04/18/08   Partial hysterectomy--1979     Thoracentesis   12/20/07       Current Outpatient Medications  Medication Sig Dispense Refill   amiodarone (PACERONE) 200 MG tablet Take 1 tablet (200 mg total) by mouth 2 (two) times daily for 28 days, THEN 1 tablet (200 mg total) daily. 90 tablet 3   B Complex Vitamins (VITAMIN B COMPLEX) TABS Take 1 tablet by mouth daily.      B Complex-C (SUPER  B-COMPLEX + VITAMIN C) TABS TAKE 1 TABLET BY MOUTH AT BEDTIME. 30 tablet 3   chlorthalidone (HYGROTON) 25 MG tablet Take 25 mg by mouth as directed. Take 1/2 tablet daily     cholecalciferol (VITAMIN D3) 25 MCG (1000 UNIT) tablet Take 1,000 Units by mouth daily.     D 1000 25 MCG (1000 UT) capsule TAKE 1 CAPSULE BY MOUTH AT BEDTIME. 30 capsule 3   ELIQUIS 5 MG TABS tablet TAKE 1 TABLET BY MOUTH TWICE DAILY 180 tablet 1   losartan (COZAAR) 100 MG tablet TAKE 1 TABLET BY MOUTH ONCE DAILY 90 tablet 2   LUMIGAN 0.01 % SOLN Place 1 drop into both eyes 2 (two) times daily.     metoprolol  tartrate (LOPRESSOR) 25 MG tablet TAKE 1/2 TABLET AS NEEDED FOR AFIB 30 tablet 1   pantoprazole (PROTONIX) 40 MG tablet Take 1 tablet (40 mg total) by mouth daily. 30 tablet 11   rosuvastatin (CRESTOR) 10 MG tablet Take 1 tablet (10 mg total) by mouth daily. 90 tablet 3   No current facility-administered medications for this visit.    Allergies:   Amlodipine, Celecoxib, and Tramadol   Social History:  The patient  reports that she has never smoked. She has never used smokeless tobacco. She reports that she does not drink alcohol and does not use drugs.   Family History:  The patient's family history includes Atrial fibrillation in her brother; Breast cancer (age of onset: 54) in her paternal aunt; Diabetes in an other family member; Heart attack in her brother; Heart disease in her brother and mother; Pancreatic cancer in her father.   ROS:  Please see the history of present illness.   Otherwise, review of systems is positive for none.   All other systems are reviewed and negative.   PHYSICAL EXAM: VS:  BP (!) 162/76    Pulse (!) 48    Ht 5\' 3"  (1.6 m)    Wt 146 lb (66.2 kg)    BMI 25.86 kg/m  , BMI Body mass index is 25.86 kg/m. GEN: Well nourished, well developed, in no acute distress  HEENT: normal  Neck: no JVD, carotid bruits, or masses Cardiac: RRR; no murmurs, rubs, or gallops,no edema  Respiratory:  clear to auscultation bilaterally, normal work of breathing GI: soft, nontender, nondistended, + BS MS: no deformity or atrophy  Skin: warm and dry Neuro:  Strength and sensation are intact Psych: euthymic mood, full affect  EKG:  EKG is not ordered today. Personal review of the ekg ordered 08/19/21 shows atrial fibrillation  Recent Labs: 05/25/2021: ALT 11 08/17/2021: B Natriuretic Peptide 353.9; Hemoglobin 11.0; Platelets 177; TSH 2.214 08/18/2021: BUN 39; Creatinine, Ser 1.77; Magnesium 2.0; Potassium 3.9; Sodium 139    Lipid Panel     Component Value Date/Time   CHOL 205  (H) 08/18/2021 0325   TRIG 47 08/18/2021 0325   HDL 78 08/18/2021 0325   CHOLHDL 2.6 08/18/2021 0325   VLDL 9 08/18/2021 0325   LDLCALC 118 (H) 08/18/2021 0325   LDLDIRECT 104.4 11/26/2012 0746     Wt Readings from Last 3 Encounters:  10/12/21 146 lb (66.2 kg)  08/27/21 140 lb (63.5 kg)  08/24/21 142 lb (64.4 kg)      Other studies Reviewed: Additional studies/ records that were reviewed today include: TTE 05/14/19  Review of the above records today demonstrates:   1. The left ventricle has normal systolic function with an ejection  fraction of 60-65%. The  cavity size was normal. There is mildly increased  left ventricular wall thickness. Left ventricular diastolic Doppler  parameters are consistent with  pseudonormalization.   2. The right ventricle has normal systolic function. The cavity was  normal. There is no increase in right ventricular wall thickness. Right  ventricular systolic pressure is mildly elevated with an estimated  pressure of 41.7 mmHg.   3. Left atrial size was moderately dilated.   4. Incompletely evaluated hypoechoic structure noted in the liver, most  likely a cyst.   5. The mitral valve is degenerative. Mild thickening of the mitral valve  leaflet. Mitral valve regurgitation is moderate by color flow Doppler.   6. Tricuspid valve regurgitation is moderate.   7. The aortic valve has an indeterminate number of cusps. Mild thickening  of the aortic valve. Mild calcification of the aortic valve. Aortic valve  regurgitation is mild by color flow Doppler.   8. The aorta is normal in size and structure.   ASSESSMENT AND PLAN:  1.  Persistent atrial fibrillation: Currently on Eliquis 5 mg twice daily.  CHA2DS2-VASc of at least 4.  Has CKD which would limit the use of dofetilide.  She would like to avoid ablation for now.  Due to that, we Kaisyn Millea start amiodarone 200 mg twice daily for 1 month followed by 200 mg daily.  She is fortunately in normal rhythm  today.  2.  Hypertension: Follows in hypertension clinic.  Continue with current management.  3.  Sick sinus syndrome: Wore a cardiac monitor that showed average heart rates in the 50s.  We Tysin Salada continue with current management.  4.  Hyperlipidemia: Continue Crestor per primary cardiology.   Current medicines are reviewed at length with the patient today.   The patient does not have concerns regarding her medicines.  The following changes were made today: Start amiodarone  Labs/ tests ordered today include:  No orders of the defined types were placed in this encounter.    Disposition:   FU with atrial fibrillation clinic 3 months  Signed, Zaydrian Batta Meredith Leeds, MD  10/12/2021 12:48 PM     [place] [address] [place] [address] [phone] (office) [phone] (fax)

## 2021-10-12 NOTE — Patient Instructions (Addendum)
Medication Instructions:  Start Amiodarone 200 mg two times a day 4 weeks, then 200 mg daily from there. Your physician recommends that you continue on your current medications as directed. Please refer to the Current Medication list given to you today. *If you need a refill on your cardiac medications before your next appointment, please call your pharmacy*  Lab Work: None. If you have labs (blood work) drawn today and your tests are completely normal, you will receive your results only by: [place] (if you have MyChart) OR A paper copy in the mail If you have any lab test that is abnormal or we need to change your treatment, we will call you to review the results.  Testing/Procedures: None.  Follow-Up: At [facility], you and your health needs are our priority.  As part of our continuing mission to provide you with exceptional heart care, we have created designated Provider Care Teams.  These Care Teams include your primary Cardiologist (physician) and Advanced Practice Providers (APPs -  Physician Assistants and Nurse Practitioners) who all work together to provide you with the care you need, when you need it.  Your physician wants you to follow-up in: 3 months in the Afib Clinic. They will contact you to schedule.  We recommend signing up for the patient portal called "MyChart".  Sign up information is provided on this After Visit Summary.  MyChart is used to connect with patients for Virtual Visits (Telemedicine).  Patients are able to view lab/test results, encounter notes, upcoming appointments, etc.  Non-urgent messages can be sent to your provider as well.   To learn more about what you can do with MyChart, go to [email or web address].    Any Other Special Instructions Will Be Listed Below (If Applicable).

## 2021-10-13 ENCOUNTER — Other Ambulatory Visit: Payer: Self-pay | Admitting: *Deleted

## 2021-10-13 ENCOUNTER — Ambulatory Visit (INDEPENDENT_AMBULATORY_CARE_PROVIDER_SITE_OTHER): Payer: Medicare Other

## 2021-10-13 DIAGNOSIS — D649 Anemia, unspecified: Secondary | ICD-10-CM

## 2021-10-13 DIAGNOSIS — Z23 Encounter for immunization: Secondary | ICD-10-CM | POA: Diagnosis not present

## 2021-10-13 DIAGNOSIS — D631 Anemia in chronic kidney disease: Secondary | ICD-10-CM

## 2021-10-13 DIAGNOSIS — N1832 Chronic kidney disease, stage 3b: Secondary | ICD-10-CM

## 2021-10-14 ENCOUNTER — Encounter (HOSPITAL_BASED_OUTPATIENT_CLINIC_OR_DEPARTMENT_OTHER): Payer: Self-pay | Admitting: Cardiovascular Disease

## 2021-10-14 NOTE — Assessment & Plan Note (Signed)
She remains bradycardic. Today she is asymptomatic.  Continue to hold metoprolol.  No PPM at this time.

## 2021-10-14 NOTE — Assessment & Plan Note (Signed)
Continue rosuvastatin.  

## 2021-10-14 NOTE — Assessment & Plan Note (Signed)
Currently back in sinus rhythm.  She likely has sick sinus syndrome and oscillates between A. fib with RVR and sinus bradycardia.  Continue to follow-up with [name] as scheduled.

## 2021-10-14 NOTE — Assessment & Plan Note (Signed)
Blood pressure has been much more controlled on her current regimen.  Continue chlorthalidone, losartan, and avoid beta-blocker unless she goes into A. fib with RVR.

## 2021-10-18 ENCOUNTER — Inpatient Hospital Stay: Payer: Medicare Other

## 2021-10-18 ENCOUNTER — Inpatient Hospital Stay: Payer: Medicare Other | Attending: Oncology

## 2021-10-18 ENCOUNTER — Other Ambulatory Visit: Payer: Self-pay

## 2021-10-18 ENCOUNTER — Inpatient Hospital Stay: Payer: Medicare Other | Admitting: Oncology

## 2021-10-18 ENCOUNTER — Encounter: Payer: Self-pay | Admitting: Oncology

## 2021-10-18 VITALS — BP 165/69 | HR 51 | Temp 97.7°F | Resp 16 | Ht 63.0 in | Wt 144.7 lb

## 2021-10-18 DIAGNOSIS — I471 Supraventricular tachycardia: Secondary | ICD-10-CM | POA: Diagnosis not present

## 2021-10-18 DIAGNOSIS — I4819 Other persistent atrial fibrillation: Secondary | ICD-10-CM | POA: Insufficient documentation

## 2021-10-18 DIAGNOSIS — R001 Bradycardia, unspecified: Secondary | ICD-10-CM | POA: Diagnosis not present

## 2021-10-18 DIAGNOSIS — K219 Gastro-esophageal reflux disease without esophagitis: Secondary | ICD-10-CM | POA: Diagnosis not present

## 2021-10-18 DIAGNOSIS — E559 Vitamin D deficiency, unspecified: Secondary | ICD-10-CM | POA: Insufficient documentation

## 2021-10-18 DIAGNOSIS — M069 Rheumatoid arthritis, unspecified: Secondary | ICD-10-CM | POA: Diagnosis not present

## 2021-10-18 DIAGNOSIS — M8580 Other specified disorders of bone density and structure, unspecified site: Secondary | ICD-10-CM | POA: Insufficient documentation

## 2021-10-18 DIAGNOSIS — I129 Hypertensive chronic kidney disease with stage 1 through stage 4 chronic kidney disease, or unspecified chronic kidney disease: Secondary | ICD-10-CM | POA: Diagnosis not present

## 2021-10-18 DIAGNOSIS — E785 Hyperlipidemia, unspecified: Secondary | ICD-10-CM | POA: Diagnosis not present

## 2021-10-18 DIAGNOSIS — D3912 Neoplasm of uncertain behavior of left ovary: Secondary | ICD-10-CM | POA: Insufficient documentation

## 2021-10-18 DIAGNOSIS — D631 Anemia in chronic kidney disease: Secondary | ICD-10-CM | POA: Insufficient documentation

## 2021-10-18 DIAGNOSIS — N1832 Chronic kidney disease, stage 3b: Secondary | ICD-10-CM

## 2021-10-18 DIAGNOSIS — D649 Anemia, unspecified: Secondary | ICD-10-CM

## 2021-10-18 LAB — COMPREHENSIVE METABOLIC PANEL
ALT: 20 U/L (ref 0–44)
AST: 25 U/L (ref 15–41)
Albumin: 4.2 g/dL (ref 3.5–5.0)
Alkaline Phosphatase: 53 U/L (ref 38–126)
Anion gap: 5 (ref 5–15)
BUN: 29 mg/dL — ABNORMAL HIGH (ref 8–23)
CO2: 27 mmol/L (ref 22–32)
Calcium: 9.3 mg/dL (ref 8.9–10.3)
Chloride: 107 mmol/L (ref 98–111)
Creatinine, Ser: 1.83 mg/dL — ABNORMAL HIGH (ref 0.44–1.00)
GFR, Estimated: 28 mL/min — ABNORMAL LOW (ref >60)
Glucose, Bld: 81 mg/dL (ref 70–99)
Potassium: 4.1 mmol/L (ref 3.5–5.1)
Sodium: 139 mmol/L (ref 135–145)
Total Bilirubin: 0.6 mg/dL (ref 0.3–1.2)
Total Protein: 7.7 g/dL (ref 6.5–8.1)

## 2021-10-18 LAB — CBC WITH DIFFERENTIAL/PLATELET
Abs Immature Granulocytes: 0.01 10*3/uL (ref 0.00–0.07)
Basophils Absolute: 0.1 10*3/uL (ref 0.0–0.1)
Basophils Relative: 1 %
Eosinophils Absolute: 0.1 10*3/uL (ref 0.0–0.5)
Eosinophils Relative: 3 %
HCT: 32.8 % — ABNORMAL LOW (ref 36.0–46.0)
Hemoglobin: 10.5 g/dL — ABNORMAL LOW (ref 12.0–15.0)
Immature Granulocytes: 0 %
Lymphocytes Relative: 39 %
Lymphs Abs: 1.8 10*3/uL (ref 0.7–4.0)
MCH: 29.8 pg (ref 26.0–34.0)
MCHC: 32 g/dL (ref 30.0–36.0)
MCV: 93.2 fL (ref 80.0–100.0)
Monocytes Absolute: 0.4 10*3/uL (ref 0.1–1.0)
Monocytes Relative: 8 %
Neutro Abs: 2.2 10*3/uL (ref 1.7–7.7)
Neutrophils Relative %: 49 %
Platelets: 166 10*3/uL (ref 150–400)
RBC: 3.52 MIL/uL — ABNORMAL LOW (ref 3.87–5.11)
RDW: 13.1 % (ref 11.5–15.5)
WBC: 4.4 10*3/uL (ref 4.0–10.5)
nRBC: 0 % (ref 0.0–0.2)

## 2021-10-18 LAB — IRON AND TIBC
Iron: 62 ug/dL (ref 28–170)
Saturation Ratios: 19 % (ref 10.4–31.8)
TIBC: 326 ug/dL (ref 250–450)
UIBC: 264 ug/dL

## 2021-10-18 LAB — VITAMIN B12: Vitamin B-12: 1062 pg/mL — ABNORMAL HIGH (ref 180–914)

## 2021-10-18 LAB — FERRITIN: Ferritin: 100 ng/mL (ref 11–307)

## 2021-10-18 NOTE — Progress Notes (Signed)
Hematology/Oncology Consult note [facility]  [phone] [phone]  Patient Care Team: [name], MD as PCP - General [name], MD as PCP - Cardiology (Cardiology) Birder Robson, MD as Referring Physician (Ophthalmology) Clent Jacks, RN as Oncology Nurse Navigator   Name of the patient: [name]  [id number]  19-Aug-1941   Date of visit: 10/18/21  Diagnosis-anemia of chronic kidney disease  Chief complaint/ Reason for visit-routine follow-up of anemia of chronic kidney disease  Heme/Onc history: patient is a 81 year old female with a past medical history significant for atrial fibrillation, paroxysmal supraventricular tachycardia, hypertension, coronary artery disease referred for anemia.  Her most recent CBC with differential from 11/11/2020 showed a white count of 5.4, H&H of 10.6/32.1 with an MCV of 92 and a platelet count of 183.  Looking back at her prior CBCs patient has always had a normal white count and a platelet count and her hemoglobin has typically remained between 10-11 since 2009.  Patient recently had iron studies, TSH and B12 checked on 11/11/2020 which was within normal limits.    Patient does have chronic kidney disease but has not required any EPO so far    Interval history-patient is here with her husband and overall she is doing well.  She was hospitalized in November 2022 for A. fib with RVR.  Presently reports doing well.  Denies any blood loss in her stool or urine denies any dark melanotic stools ECOG PS- 1 Pain scale- 0   Review of systems- Review of Systems  Constitutional:  Positive for malaise/fatigue. Negative for chills, fever and weight loss.  HENT:  Negative for congestion, ear discharge and nosebleeds.   Eyes:  Negative for blurred vision.  Respiratory:  Negative for cough, hemoptysis, sputum production, shortness of breath and wheezing.   Cardiovascular:  Negative for chest  pain, palpitations, orthopnea and claudication.  Gastrointestinal:  Negative for abdominal pain, blood in stool, constipation, diarrhea, heartburn, melena, nausea and vomiting.  Genitourinary:  Negative for dysuria, flank pain, frequency, hematuria and urgency.  Musculoskeletal:  Negative for back pain, joint pain and myalgias.  Skin:  Negative for rash.  Neurological:  Negative for dizziness, tingling, focal weakness, seizures, weakness and headaches.  Endo/Heme/Allergies:  Does not bruise/bleed easily.  Psychiatric/Behavioral:  Negative for depression and suicidal ideas. The patient does not have insomnia.       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 cath 2/09: no CAD, EF 70%   GERD (gastroesophageal reflux disease) 07/26/2021   HYPERLIPIDEMIA    HYPERTENSION    Mild Aortic insufficiency    Moderate mitral regurgitation    Moderate tricuspid regurgitation    OSTEOPENIA    PAF (paroxysmal atrial fibrillation) ([place])    a. s/p ablation 2013; b. CHADS2VASc -> 4 (HTN, age x 2, female)-->Eliquis.   PAT (paroxysmal atrial tachycardia) ([place])    a. 04/2019 Zio: Occas PACs and rare PVCs. 21 atrial runs - longest 20 beats, max rate 169.   Pneumonia 2009   RA (rheumatoid arthritis) ([facility])    Sinus Bradycardia  a. asymptomatic but prevents use of AVN blocking agents; b. 04/2019 Zio: Avg HR 61 (37-109).   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 05/2019 Echo: EF 60-65%. DD. RVSP 41.72mmHg. Mod dil LA. Mod MR/TR. Mild AI.     Past Surgical History:  Procedure Laterality Date   ABLATION OF  DYSRHYTHMIC FOCUS     CARDIAC CATHETERIZATION     COLONOSCOPY  04/18/08   Partial hysterectomy--1979     Thoracentesis   12/20/07      Social History   Socioeconomic History   Marital status: Married    Spouse name: Not on file   Number of children: Not on file   Years of education: Not on file   Highest education level: Not on file  Occupational History   Not on file  Tobacco Use   Smoking status: Never   Smokeless tobacco: Never  Vaping Use   Vaping Use: Never used  Substance and Sexual Activity   Alcohol use: No    Alcohol/week: 0.0 standard drinks   Drug use: No   Sexual activity: Never    Birth control/protection: None  Other Topics Concern   Not on file  Social History Narrative   Marital Status: widow x 1 yr   Children: 64, grandchildren 62, numerous great grand children   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 Full Code ( reviewed 2015)       Occasional exercise.   Diet: fruits and veggies, lean meats.   Social Determinants of Health   Financial Resource Strain: Not on file  Food Insecurity: Not on file  Transportation Needs: Not on file  Physical Activity: Not on file  Stress: Not on file  Social Connections: Not on file  Intimate Partner Violence: Not on file    Family History  Problem Relation Age of Onset   Diabetes Other    Breast cancer Paternal Aunt 73   Heart disease Mother    Pancreatic cancer Father    Atrial fibrillation Brother    Heart disease Brother    Heart attack Brother      Current Outpatient Medications:    amiodarone (PACERONE) 200 MG tablet, Take 1 tablet (200 mg total) by mouth 2 (two) times daily for 28 days, THEN 1 tablet (200 mg total) daily., Disp:  90 tablet, Rfl: 3   B Complex Vitamins (VITAMIN B COMPLEX) TABS, Take 1 tablet by mouth daily. , Disp: , Rfl:    B Complex-C (SUPER B-COMPLEX + VITAMIN C) TABS, TAKE 1 TABLET BY MOUTH AT BEDTIME., Disp: 30 tablet, Rfl: 3   chlorthalidone (HYGROTON) 25 MG tablet, Take 25 mg by mouth as directed. Take 1/2 tablet daily, Disp: , Rfl:    cholecalciferol (VITAMIN D3) 25 MCG (1000 UNIT) tablet, Take 1,000 Units by mouth daily., Disp: , Rfl:    D 1000 25 MCG (1000 UT) capsule, TAKE 1 CAPSULE BY MOUTH AT BEDTIME., Disp: 30 capsule, Rfl: 3   ELIQUIS 5 MG TABS tablet, TAKE 1 TABLET BY MOUTH TWICE DAILY, Disp: 180 tablet, Rfl: 1   losartan (COZAAR) 100 MG tablet, TAKE 1 TABLET BY MOUTH ONCE DAILY, Disp: 90 tablet, Rfl: 2   LUMIGAN 0.01 % SOLN, Place 1 drop into both eyes 2 (two) times  daily., Disp: , Rfl:    metoprolol tartrate (LOPRESSOR) 25 MG tablet, TAKE 1/2 TABLET AS NEEDED FOR AFIB, Disp: 30 tablet, Rfl: 1   pantoprazole (PROTONIX) 40 MG tablet, Take 1 tablet (40 mg total) by mouth daily., Disp: 30 tablet, Rfl: 11   rosuvastatin (CRESTOR) 10 MG tablet, Take 1 tablet (10 mg total) by mouth daily., Disp: 90 tablet, Rfl: 3  Physical exam:  Vitals:   10/18/21 1019  BP: (!) 165/69  Pulse: (!) 51  Resp: 16  Temp: 97.7 F (36.5 C)  TempSrc: Tympanic  SpO2: 100%  Weight: 144 lb 11.2 oz (65.6 kg)  Height: 5\' 3"  (1.6 m)   Physical Exam Constitutional:      General: She is not in acute distress. Cardiovascular:     Rate and Rhythm: Normal rate and regular rhythm.     Heart sounds: Normal heart sounds.  Pulmonary:     Effort: Pulmonary effort is normal.     Breath sounds: Normal breath sounds.  Abdominal:     General: Bowel sounds are normal.     Palpations: Abdomen is soft.  Skin:    General: Skin is warm and dry.  Neurological:     Mental Status: She is alert and oriented to person, place, and time.     CMP Latest Ref Rng & Units 10/18/2021  Glucose 70 - 99 mg/dL 81  BUN 8 - 23 mg/dL  29(H)  Creatinine 0.44 - 1.00 mg/dL 1.83(H)  Sodium 135 - 145 mmol/L 139  Potassium 3.5 - 5.1 mmol/L 4.1  Chloride 98 - 111 mmol/L 107  CO2 22 - 32 mmol/L 27  Calcium 8.9 - 10.3 mg/dL 9.3  Total Protein 6.5 - 8.1 g/dL 7.7  Total Bilirubin 0.3 - 1.2 mg/dL 0.6  Alkaline Phos 38 - 126 U/L 53  AST 15 - 41 U/L 25  ALT 0 - 44 U/L 20   CBC Latest Ref Rng & Units 10/18/2021  WBC 4.0 - 10.5 K/uL 4.4  Hemoglobin 12.0 - 15.0 g/dL 10.5(L)  Hematocrit 36.0 - 46.0 % 32.8(L)  Platelets 150 - 400 K/uL 166     Assessment and plan- Patient is a 81 y.o. female with anemia of chronic kidney disease here for routine follow-up  Patient has not required any EPO so far.  Her hemoglobin did drop down to 9.5 in October but is back up to 10.5 today.  I will therefore hold off on starting EPO at this time given that the goal with EPO would be to maintain her hemoglobin between 10-11.  Ferritin levels are presently normal at 100 and therefore I will hold off on any IV iron at this time.  B12 levels are normal at 1062.  Repeat CBC ferritin and iron studies in 3 in 6 months and I will see her back in 6 months   Visit Diagnosis 1. Anemia of chronic renal failure, stage 3b (HCC)      Dr. Randa Evens, MD, MPH [facility] [phone] 10/18/2021 2:19 PM

## 2021-10-27 NOTE — Progress Notes (Deleted)
Subjective:   [name] is a 81 y.o. female who presents for Medicare Annual (Subsequent) preventive examination.  I connected with [name] today by telephone and verified that I am speaking with the correct person using two identifiers. Location patient: home Location provider: work Persons participating in the virtual visit: patient, Marine scientist.    I discussed the limitations, risks, security and privacy concerns of performing an evaluation and management service by telephone and the availability of in person appointments. I also discussed with the patient that there may be a patient responsible charge related to this service. The patient expressed understanding and verbally consented to this telephonic visit.    Interactive audio and video telecommunications were attempted between this provider and patient, however failed, due to patient having technical difficulties OR patient did not have access to video capability.  We continued and completed visit with audio only.  Some vital signs may be absent or patient reported.   Time Spent with patient on telephone encounter: *** minutes  Review of Systems           Objective:    There were no vitals filed for this visit. There is no height or weight on file to calculate BMI.  Advanced Directives 10/18/2021 08/17/2021 08/04/2021 07/28/2021 01/26/2021 11/24/2020 03/18/2020  Does Patient Have a Medical Advance Directive? Yes Yes No No Yes Yes Yes  Type of Paramedic of [place];Living will [place];Living will [place];Living will - [place];Living will Living will Living will  Does patient want to make changes to medical advance directive? - - Yes (ED - Information included in AVS) - No - Patient declined - -  Copy of [place] in Chart? - - - - No - copy requested - -  Would patient like information on creating a medical advance  directive? - - Yes (ED - Information included in AVS) No - Patient declined - - -    Current Medications (verified) Outpatient Encounter Medications as of 10/28/2021  Medication Sig   amiodarone (PACERONE) 200 MG tablet Take 1 tablet (200 mg total) by mouth 2 (two) times daily for 28 days, THEN 1 tablet (200 mg total) daily.   B Complex Vitamins (VITAMIN B COMPLEX) TABS Take 1 tablet by mouth daily.    B Complex-C (SUPER B-COMPLEX + VITAMIN C) TABS TAKE 1 TABLET BY MOUTH AT BEDTIME.   chlorthalidone (HYGROTON) 25 MG tablet Take 25 mg by mouth as directed. Take 1/2 tablet daily   cholecalciferol (VITAMIN D3) 25 MCG (1000 UNIT) tablet Take 1,000 Units by mouth daily.   D 1000 25 MCG (1000 UT) capsule TAKE 1 CAPSULE BY MOUTH AT BEDTIME.   ELIQUIS 5 MG TABS tablet TAKE 1 TABLET BY MOUTH TWICE DAILY   losartan (COZAAR) 100 MG tablet TAKE 1 TABLET BY MOUTH ONCE DAILY   LUMIGAN 0.01 % SOLN Place 1 drop into both eyes 2 (two) times daily.   metoprolol tartrate (LOPRESSOR) 25 MG tablet TAKE 1/2 TABLET AS NEEDED FOR AFIB   pantoprazole (PROTONIX) 40 MG tablet Take 1 tablet (40 mg total) by mouth daily.   rosuvastatin (CRESTOR) 10 MG tablet Take 1 tablet (10 mg total) by mouth daily.   No facility-administered encounter medications on file as of 10/28/2021.    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 cath 2/09: no CAD, EF 70%   GERD (gastroesophageal reflux disease) 07/26/2021   HYPERLIPIDEMIA    HYPERTENSION    Mild Aortic insufficiency    Moderate mitral regurgitation    Moderate tricuspid regurgitation    OSTEOPENIA    PAF (paroxysmal atrial fibrillation) ([facility])    a. s/p ablation 2013; b. CHADS2VASc -> 4 (HTN, age x 2, female)-->Eliquis.   PAT (paroxysmal atrial tachycardia)  ([facility])    a. 04/2019 Zio: Occas PACs and rare PVCs. 21 atrial runs - longest 20 beats, max rate 169.   Pneumonia 2009   RA (rheumatoid arthritis) ([facility])    Sinus Bradycardia    a. asymptomatic but prevents use of AVN blocking agents; b. 04/2019 Zio: Avg HR 61 (37-109).   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 05/2019 Echo: EF 60-65%. DD. RVSP 41.53mmHg. Mod dil LA. Mod MR/TR. Mild AI.   Past Surgical History:  Procedure Laterality Date   ABLATION OF DYSRHYTHMIC FOCUS     CARDIAC CATHETERIZATION     COLONOSCOPY  04/18/08   Partial hysterectomy--1979     Thoracentesis   12/20/07     Family History  Problem Relation Age of Onset   Diabetes Other    Breast cancer Paternal Aunt 88   Heart disease Mother    Pancreatic cancer Father    Atrial fibrillation Brother    Heart disease Brother    Heart attack Brother    Social History   Socioeconomic History   Marital status: Married    Spouse name: Not on file   Number of children: Not on file   Years of education: Not on file   Highest education level: Not on file  Occupational History   Not on file  Tobacco Use   Smoking status: Never   Smokeless tobacco: Never  Vaping Use   Vaping Use: Never used  Substance and Sexual Activity   Alcohol use: No    Alcohol/week: 0.0 standard drinks   Drug use: No   Sexual activity: Never    Birth control/protection: None  Other Topics Concern   Not on file  Social History Narrative   Marital Status: widow x 1 yr   Children: 57, grandchildren 76, numerous great grand children   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 Full Code ( reviewed 2015)       Occasional exercise.   Diet: fruits and veggies, lean  meats.   Social Determinants of Health   Financial Resource Strain: Not on file  Food Insecurity: Not on file  Transportation Needs: Not on file  Physical Activity: Not on file  Stress: Not on file  Social Connections: Not on file    Tobacco Counseling Counseling given: Not Answered   Clinical Intake:                 Diabetic? No         Activities of Daily Living No flowsheet data found.  Patient Care Team: [name], MD as PCP - General [name], MD as PCP - Cardiology (Cardiology) Birder Robson, MD as Referring Physician (Ophthalmology) Clent Jacks, RN as  Oncology Nurse Navigator  Indicate any recent Medical Services you may have received from other than [facility] providers in the past year (date may be approximate).     Assessment:   This is a routine wellness examination for Cniyah.  Hearing/Vision screen No results found.  Dietary issues and exercise activities discussed:     Goals Addressed   None    Depression Screen PHQ 2/9 Scores 08/08/2019 07/16/2018 07/12/2017 03/10/2016 05/11/2015 04/17/2015 02/03/2015  PHQ - 2 Score 0 0 0 0 0 0 0  PHQ- 9 Score - 0 1 - - - -    Fall Risk Fall Risk  08/08/2019 07/16/2018 07/12/2017 03/10/2016 05/11/2015  Falls in the past year? 0 No No No No    FALL RISK PREVENTION PERTAINING TO THE HOME:  Any stairs in or around the home? {YES/NO:21197} If so, are there any without handrails? {YES/NO:21197} Home free of loose throw rugs in walkways, pet beds, electrical cords, etc? {YES/NO:21197} Adequate lighting in your home to reduce risk of falls? {YES/NO:21197}  ASSISTIVE DEVICES UTILIZED TO PREVENT FALLS:  Life alert? {YES/NO:21197} Use of a cane, walker or w/c? {YES/NO:21197} Grab bars in the bathroom? {YES/NO:21197} Shower chair or bench in shower? {YES/NO:21197} Elevated toilet seat or a handicapped toilet? {YES/NO:21197}  TIMED UP AND GO:  Was the test performed? No .    Cognitive  Function: MMSE - Mini Mental State Exam 07/16/2018 07/12/2017 03/10/2016  Orientation to time 5 5 5   Orientation to Place 5 5 5   Registration 3 3 3   Attention/ Calculation 0 0 0  Recall 3 1 3   Recall-comments - unable to recall 2 of 3 words -  Language- name 2 objects 0 0 0  Language- repeat 1 1 1   Language- follow 3 step command 3 3 0  Language- follow 3 step command-comments - - pt was unable to follow 3 steps to 3 step command  Language- read & follow direction 0 0 0  Write a sentence 0 0 0  Copy design 0 0 0  Total score 20 18 17         Immunizations Immunization History  Administered Date(s) Administered   Fluad Quad(high Dose 65+) 07/17/2019, 11/10/2020, 10/13/2021   Influenza Whole 08/05/2009   Influenza, High Dose Seasonal PF 07/14/2015   Influenza,inj,Quad PF,6+ Mos 08/25/2016, 07/14/2017, 07/16/2018   PFIZER(Purple Top)SARS-COV-2 Vaccination 11/04/2019, 11/25/2019, 09/25/2020   PPD Test 05/01/2012, 04/17/2013   Pneumococcal Conjugate-13 04/17/2015   Pneumococcal Polysaccharide-23 10/11/2007   Td 10/11/2003, 02/13/2019    TDAP status: Up to date  Flu Vaccine status: Up to date  Pneumococcal vaccine status: Up to date  {Covid-19 vaccine status:2101808}  Qualifies for Shingles Vaccine? Yes   Zostavax completed No   {Shingrix Completed?:2101804}  Screening Tests Health Maintenance  Topic Date Due   Zoster Vaccines- Shingrix (1 of 2) Never done   MAMMOGRAM  01/20/2019   COVID-19 Vaccine (4 - Booster for Pfizer series) 11/20/2020   TETANUS/TDAP  02/12/2029   Pneumonia Vaccine 29+ Years old  Completed   INFLUENZA VACCINE  Completed   DEXA SCAN  Completed   Hepatitis C Screening  Completed   HPV VACCINES  Aged Out    Health Maintenance  Health Maintenance Due  Topic Date Due   Zoster Vaccines- Shingrix (1 of 2) Never done   MAMMOGRAM  01/20/2019   COVID-19 Vaccine (4 - Booster for Pfizer series) 11/20/2020    Colorectal cancer screening: No longer  required.   {Mammogram status:21018020}  {Bone Density  status:21018021}  Lung Cancer Screening: (Low Dose CT Chest recommended if Age 76-80 years, 30 pack-year currently smoking OR have quit w/in 15years.) does not qualify.     Additional Screening:  Hepatitis C Screening: does qualify; Completed 12/28/12  Vision Screening: Recommended annual ophthalmology exams for early detection of glaucoma and other disorders of the eye. Is the patient up to date with their annual eye exam?  {YES/NO:21197} Who is the provider or what is the name of the office in which the patient attends annual eye exams? *** If pt is not established with a provider, would they like to be referred to a provider to establish care? {YES/NO:21197}.   Dental Screening: Recommended annual dental exams for proper oral hygiene  Community Resource Referral / Chronic Care Management: CRR required this visit?  {YES/NO:21197}  CCM required this visit?  {YES/NO:21197}     Plan:     I have personally reviewed and noted the following in the patients chart:   Medical and social history Use of alcohol, tobacco or illicit drugs  Current medications and supplements including opioid prescriptions.  Functional ability and status Nutritional status Physical activity Advanced directives List of other physicians Hospitalizations, surgeries, and ER visits in previous 12 months Vitals Screenings to include cognitive, depression, and falls Referrals and appointments  In addition, I have reviewed and discussed with patient certain preventive protocols, quality metrics, and best practice recommendations. A written personalized care plan for preventive services as well as general preventive health recommendations were provided to patient.   Due to this being a telephonic visit, the after visit summary with patients personalized plan was offered to patient via mail or my-chart. ***Patient declined at this time./ Patient would  like to access on my-chart/ per request, patient was mailed a copy of AVS./ Patient preferred to pick up at office at next visit.   Loma Messing, LPN   [id number]/84/6962   Nurse Health Advisor  Nurse Notes: none

## 2021-10-28 ENCOUNTER — Telehealth: Payer: Self-pay

## 2021-10-28 ENCOUNTER — Ambulatory Visit: Payer: Medicare Other

## 2021-10-28 NOTE — Telephone Encounter (Signed)
Made several attempts to reach patient on [phone] in regards to annual wellness telephone visit today @ 1:15pm. Unable to leave a vm message. Patient should contact office to reschedule appointment. TM

## 2021-11-04 ENCOUNTER — Ambulatory Visit
Admission: RE | Admit: 2021-11-04 | Discharge: 2021-11-04 | Disposition: A | Discharge status: Home / Self Care | Payer: Medicare Other | Source: Ambulatory Visit | Attending: Nurse Practitioner | Admitting: Nurse Practitioner

## 2021-11-04 DIAGNOSIS — N9489 Other specified conditions associated with female genital organs and menstrual cycle: Secondary | ICD-10-CM | POA: Diagnosis present

## 2021-11-04 IMAGING — US US PELVIS COMPLETE WITH TRANSVAGINAL
1 series · 13 of 25 positions shown · non-contrast
Comparison: [DATE]

CLINICAL DATA: Follow-up adnexal cyst



[Series 1: gyn us · 72 acquisitions, 13 frames shown]
[im 1/72]
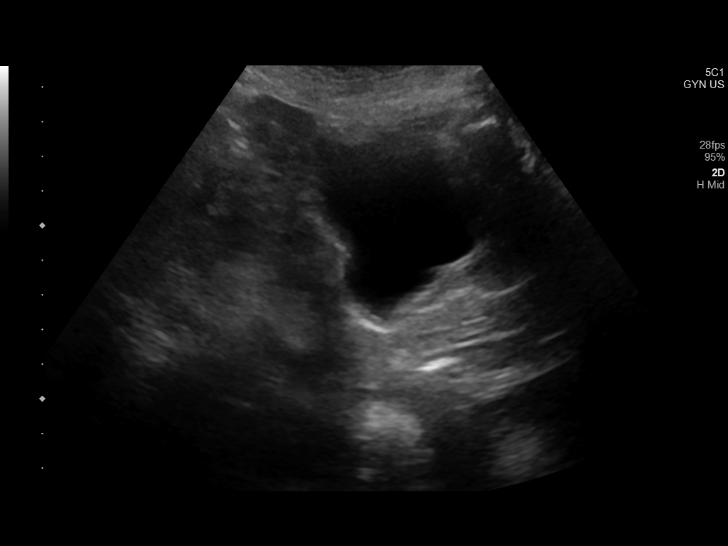
[im 6/72]
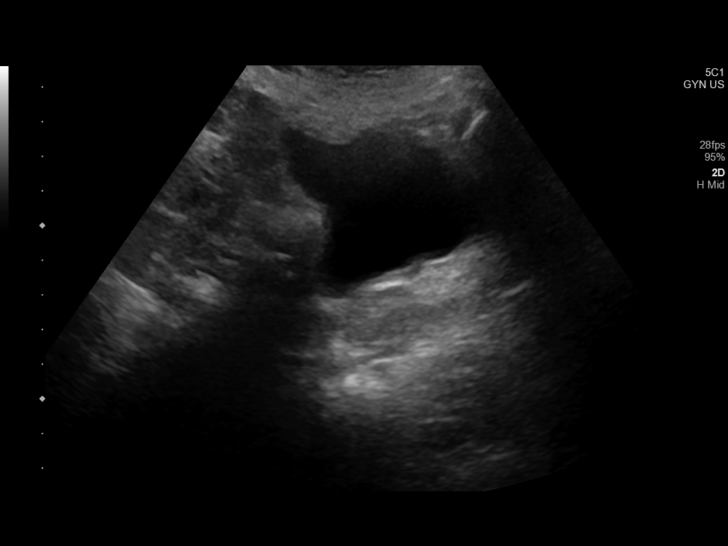
[im 12/72]
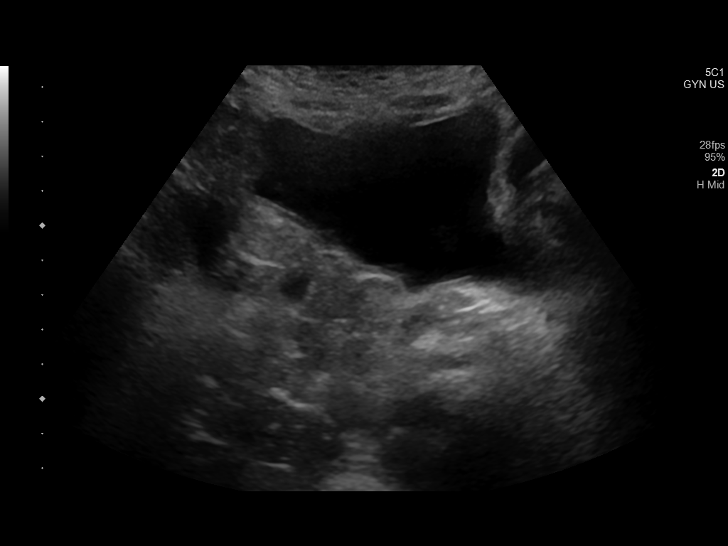
[im 18/72]
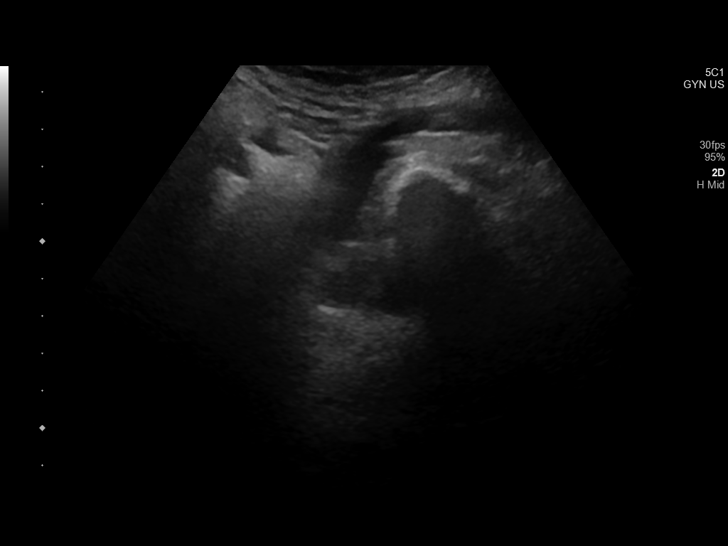
[im 24/72]
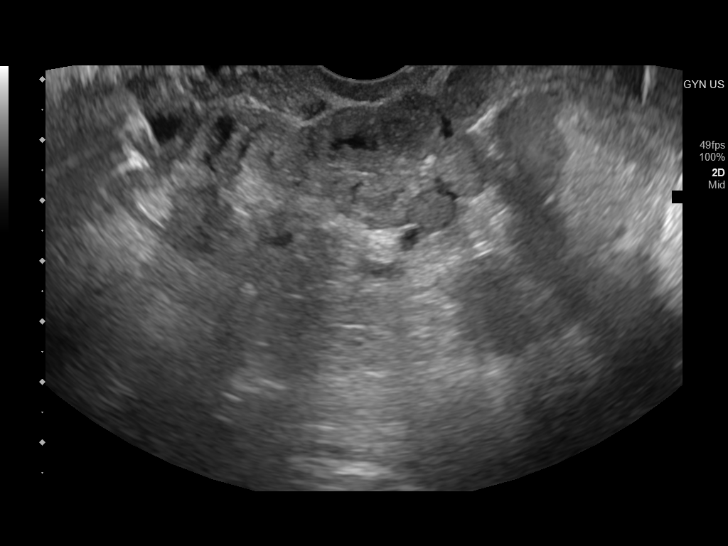
[im 30/72]
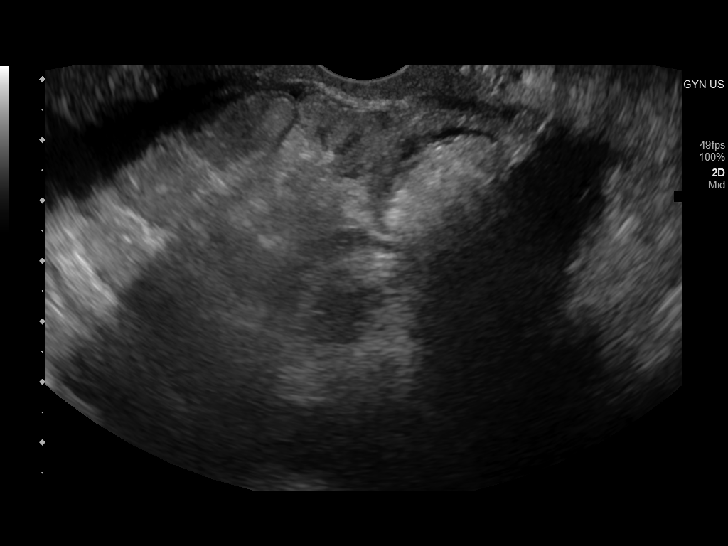
[im 36/72]
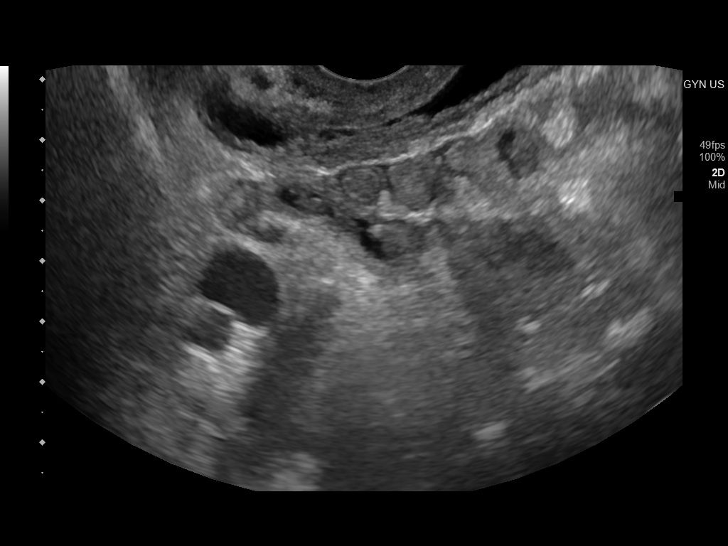
[im 42/72]
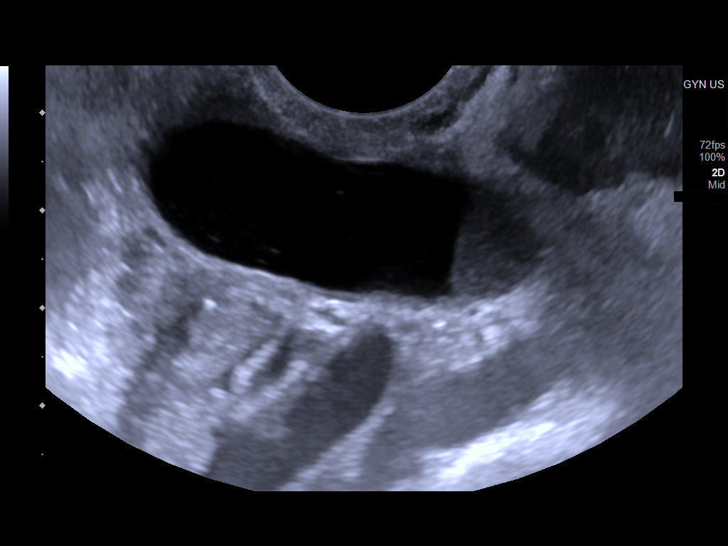
[im 48/72]
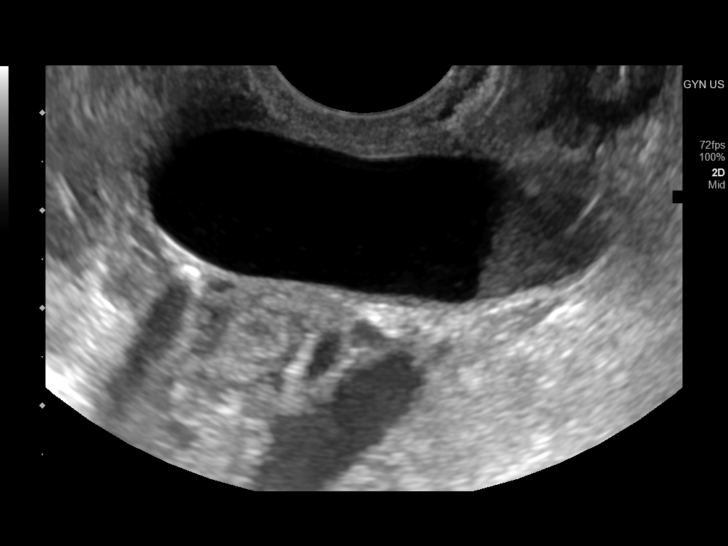
[im 54/72]
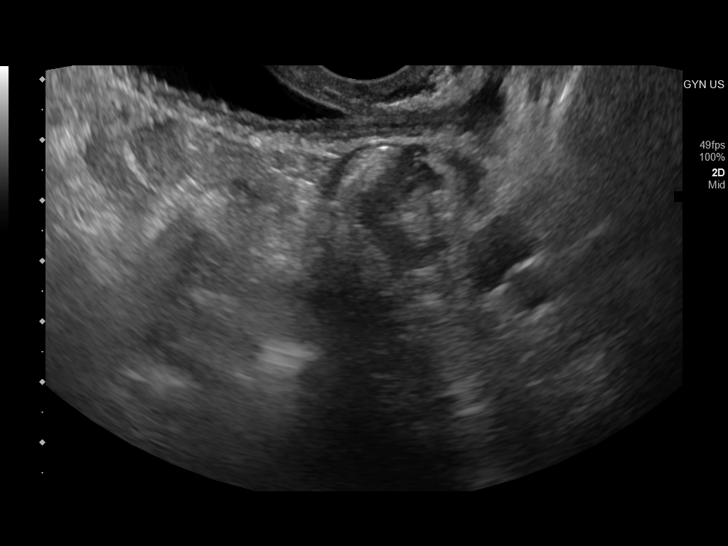
[im 60/72]
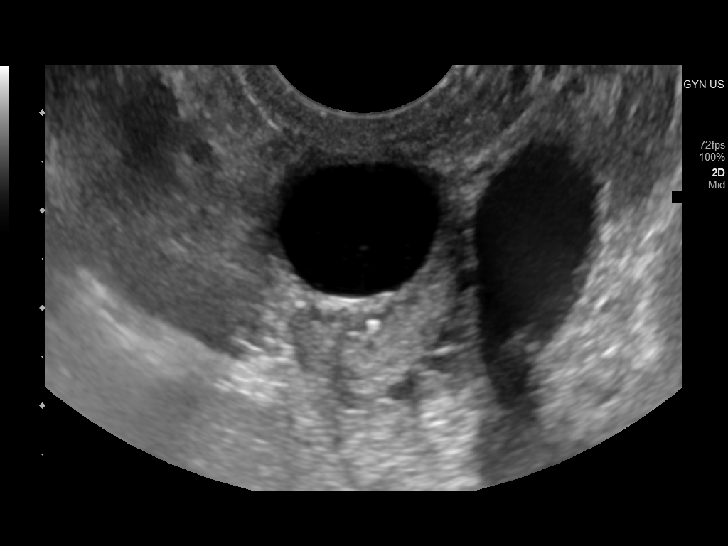
[im 66/72]
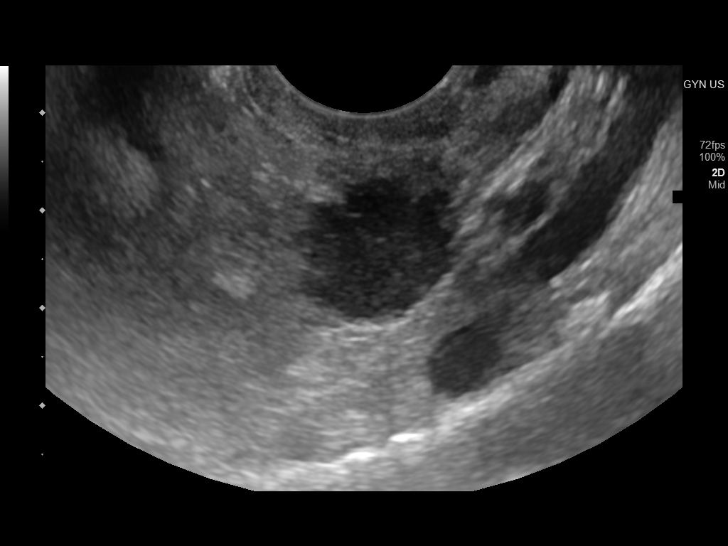
[im 72/72]
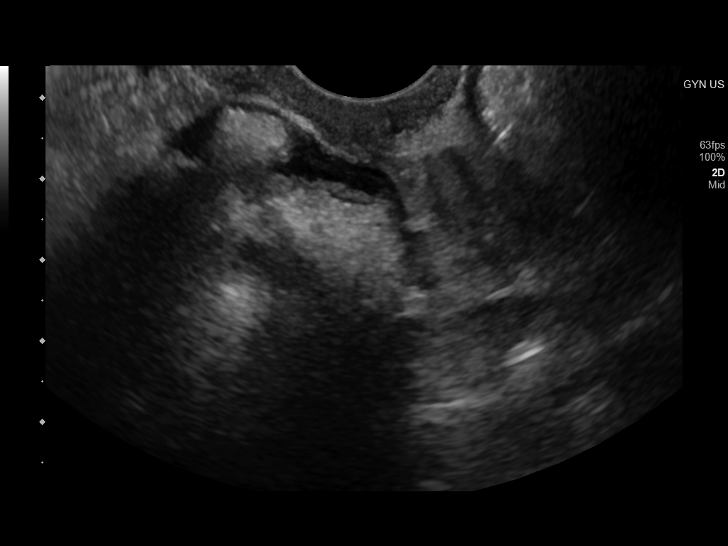

[13 of 25 positions shown; findings below may reference images not displayed]

FINDINGS: Uterus

Surgically absent

Endometrium

Surgically absent

Right ovary

Not visualized, likely obscured by bowel

Left ovary

No normal appearing LEFT ovary visualized, see below

Other findings

Complex cyst LEFT adnexa 4.6 x 1.6 x 1.9 cm (previously 5.2 x 1.7 x
1.8 cm), containing a dependent debris fluid level. Single small
mural nodule within the cystic lesion, 5 x 5 x 3 mm, measured 5 x 5
x 5 mm on previous exam. No septations, wall thickening, or
additional mural nodules. No free pelvic fluid or additional pelvic
masses.
IMPRESSION: Surgical absence of uterus with nonvisualization of RIGHT ovary.

Minimally complicated cyst of the LEFT adnexa 4.6 cm greatest size
slightly decreased from the 5.2 cm on the previous exam, containing
a dependent fluid level consistent with old hemorrhage or debris.

The small nodular focus within the lesion is little changed,
remaining 5 mm greatest diameter.

No other pelvic sonographic abnormalities.

## 2021-11-12 DIAGNOSIS — H35371 Puckering of macula, right eye: Secondary | ICD-10-CM | POA: Diagnosis not present

## 2021-11-17 ENCOUNTER — Inpatient Hospital Stay: Payer: Medicare Other | Attending: Obstetrics and Gynecology | Admitting: Nurse Practitioner

## 2021-11-17 ENCOUNTER — Other Ambulatory Visit: Payer: Self-pay

## 2021-11-17 VITALS — BP 164/69 | HR 51 | Temp 98.7°F | Resp 20 | Wt 145.3 lb

## 2021-11-17 DIAGNOSIS — I480 Paroxysmal atrial fibrillation: Secondary | ICD-10-CM | POA: Insufficient documentation

## 2021-11-17 DIAGNOSIS — I130 Hypertensive heart and chronic kidney disease with heart failure and stage 1 through stage 4 chronic kidney disease, or unspecified chronic kidney disease: Secondary | ICD-10-CM | POA: Insufficient documentation

## 2021-11-17 DIAGNOSIS — K219 Gastro-esophageal reflux disease without esophagitis: Secondary | ICD-10-CM | POA: Insufficient documentation

## 2021-11-17 DIAGNOSIS — Z90722 Acquired absence of ovaries, bilateral: Secondary | ICD-10-CM | POA: Insufficient documentation

## 2021-11-17 DIAGNOSIS — I2510 Atherosclerotic heart disease of native coronary artery without angina pectoris: Secondary | ICD-10-CM | POA: Diagnosis not present

## 2021-11-17 DIAGNOSIS — Z888 Allergy status to other drugs, medicaments and biological substances status: Secondary | ICD-10-CM | POA: Insufficient documentation

## 2021-11-17 DIAGNOSIS — M1612 Unilateral primary osteoarthritis, left hip: Secondary | ICD-10-CM | POA: Insufficient documentation

## 2021-11-17 DIAGNOSIS — Z833 Family history of diabetes mellitus: Secondary | ICD-10-CM | POA: Insufficient documentation

## 2021-11-17 DIAGNOSIS — Z803 Family history of malignant neoplasm of breast: Secondary | ICD-10-CM | POA: Diagnosis not present

## 2021-11-17 DIAGNOSIS — R001 Bradycardia, unspecified: Secondary | ICD-10-CM | POA: Insufficient documentation

## 2021-11-17 DIAGNOSIS — N9489 Other specified conditions associated with female genital organs and menstrual cycle: Secondary | ICD-10-CM | POA: Diagnosis not present

## 2021-11-17 DIAGNOSIS — N838 Other noninflammatory disorders of ovary, fallopian tube and broad ligament: Secondary | ICD-10-CM | POA: Insufficient documentation

## 2021-11-17 DIAGNOSIS — M8580 Other specified disorders of bone density and structure, unspecified site: Secondary | ICD-10-CM | POA: Diagnosis not present

## 2021-11-17 DIAGNOSIS — Z8249 Family history of ischemic heart disease and other diseases of the circulatory system: Secondary | ICD-10-CM | POA: Diagnosis not present

## 2021-11-17 DIAGNOSIS — Z885 Allergy status to narcotic agent status: Secondary | ICD-10-CM | POA: Insufficient documentation

## 2021-11-17 DIAGNOSIS — Z800 Family history of malignant neoplasm of digestive organs: Secondary | ICD-10-CM | POA: Diagnosis not present

## 2021-11-17 DIAGNOSIS — D631 Anemia in chronic kidney disease: Secondary | ICD-10-CM | POA: Diagnosis not present

## 2021-11-17 DIAGNOSIS — E785 Hyperlipidemia, unspecified: Secondary | ICD-10-CM | POA: Insufficient documentation

## 2021-11-17 DIAGNOSIS — R5383 Other fatigue: Secondary | ICD-10-CM | POA: Diagnosis not present

## 2021-11-17 DIAGNOSIS — N1832 Chronic kidney disease, stage 3b: Secondary | ICD-10-CM | POA: Insufficient documentation

## 2021-11-17 DIAGNOSIS — E559 Vitamin D deficiency, unspecified: Secondary | ICD-10-CM | POA: Diagnosis not present

## 2021-11-17 DIAGNOSIS — Z79899 Other long term (current) drug therapy: Secondary | ICD-10-CM | POA: Diagnosis not present

## 2021-11-17 DIAGNOSIS — Z7901 Long term (current) use of anticoagulants: Secondary | ICD-10-CM | POA: Insufficient documentation

## 2021-11-17 DIAGNOSIS — I5032 Chronic diastolic (congestive) heart failure: Secondary | ICD-10-CM | POA: Diagnosis not present

## 2021-11-17 DIAGNOSIS — Z90710 Acquired absence of both cervix and uterus: Secondary | ICD-10-CM | POA: Insufficient documentation

## 2021-11-17 NOTE — Progress Notes (Signed)
Gynecologic Oncology Consult Visit   Referring Provider: PCP Dr Diona Browner  Chief Concern: adnexal mass  Subjective:  [name] is a 81 y.o. female who is seen in consultation from Dr. Diona Browner for follow up for incidental adnexal mass. In the interim she has had repeat imaging.   US Pelvic complete 11/04/2021  FINDINGS: Uterus: Surgically absent Endometrium: Surgically absent Right ovary: Not visualized, likely obscured by bowel Left ovary: No normal appearing LEFT ovary visualized, see below Other findings: Complex cyst LEFT adnexa 4.6 x 1.6 x 1.9 cm (previously 5.2 x 1.7 x 1.8 cm), containing a dependent debris fluid level. Single small mural nodule within the cystic lesion, 5 x 5 x 3 mm, measured 5 x 5 x 5 mm on previous exam. No septations, wall thickening, or additional mural nodules. No free pelvic fluid or additional pelvic masses.   IMPRESSION: - Surgical absence of uterus with nonvisualization of RIGHT ovary. - Minimally complicated cyst of the LEFT adnexa 4.6 cm greatest size slightly decreased from the 5.2 cm on the previous exam, containing a dependent fluid level consistent with old hemorrhage or debris. - The small nodular focus within the lesion is little changed, remaining 5 mm greatest diameter. - No other pelvic sonographic abnormalities.  Gynecologic History:  Presented to pcp for hip pain. Imaging was performed which showed incidental adnexal mass.     04/28/21 [name]  IMPRESSION: 1. No acute osseous findings.  Minimal left hip osteoarthritis. 2. Tendinosis with low-grade insertional tearing of the bilateral gluteus minimus tendons. 3. Mild bilateral gluteus medius tendinosis without tear. 4. Mild tendinosis of the bilateral hamstring tendon origins. 5. Left adnexal cyst measuring up to 3.9 cm with possible small nodule along its superior aspect. Pelvic ultrasound is recommended for further characterization.  Pelvic US 06/09/21 Post hysterectomy with  nonvisualization of normal appearing ovaries.   5.2 x 1.7 x 1.8 cm diameter complex cystic nodule in LEFT adnexa question of ovarian or paraovarian origin, complicated by scattered internal echoes and a 5 mm mural nodule which lacks internal blood flow on color Doppler imaging.   CKD with anemia.   Bradycardia   Problem List: Patient Active Problem List   Diagnosis Date Noted   Atrial fibrillation with rapid ventricular response ([place]) 08/18/2021   Acute on chronic diastolic CHF (congestive heart failure) ([place]) 08/17/2021   Chest pain 08/17/2021   Adnexal mass 08/04/2021   Anemia of chronic renal failure, stage 3b ([place]) 08/03/2021   GERD (gastroesophageal reflux disease) 07/26/2021   Acute pain of left hip 04/20/2021   COVID-19 virus infection 04/08/2021   Diarrhea of presumed infectious origin 12/03/2020   Memory loss 11/10/2020   Urinary frequency 11/10/2020   Sinus bradycardia 07/18/2019   Valvular heart disease 07/18/2019   PSVT (paroxysmal supraventricular tachycardia) ([place]) 05/16/2019   Blistering rash 02/15/2019   Persistent atrial fibrillation ([place]) 07/19/2018   Peripheral edema 02/02/2018   Acute nonintractable headache 02/02/2018   Coronary artery disease, non-occlusive 09/14/2017   Status post ablation of atrial fibrillation 09/14/2017   Essential hypertension 09/14/2017   Tinea pedis 07/14/2017   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/15/2016   Stage 3b chronic kidney disease (CKD) ([place]) 03/14/2016   History of rheumatoid arthritis 03/14/2016   Mitral regurgitation 12/30/2015   Obesity 12/02/2015   Aortic valve insufficiency 12/02/2015   Vitamin D deficiency 04/17/2015   Counseling regarding end of life decision making 04/17/2015   Allergic rhinitis 02/03/2015   Aortic valve sclerosis 08/19/2010   Osteopenia 05/11/2010  SINUS BRADYCARDIA 08/19/2009   Anemia of chronic disease 12/13/2007   HYPERLIPIDEMIA 08/23/2007   UNSPECIFIED GLAUCOMA 10/10/2006     Past Medical History: Past Medical History:  Diagnosis Date   Anemia    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 cath 2/09: no CAD, EF 70%   GERD (gastroesophageal reflux disease) 07/26/2021   HYPERLIPIDEMIA    HYPERTENSION    Mild Aortic insufficiency    Moderate mitral regurgitation    Moderate tricuspid regurgitation    OSTEOPENIA    PAF (paroxysmal atrial fibrillation) ([place])    a. s/p ablation 2013; b. CHADS2VASc -> 4 (HTN, age x 2, female)-->Eliquis.   PAT (paroxysmal atrial tachycardia) ([place])    a. 04/2019 Zio: Occas PACs and rare PVCs. 21 atrial runs - longest 20 beats, max rate 169.   Pneumonia 2009   RA (rheumatoid arthritis) ([facility])    Sinus Bradycardia    a. asymptomatic but prevents use of AVN blocking agents; b. 04/2019 Zio: Avg HR 61 (37-109).   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 05/2019 Echo: EF 60-65%. DD. RVSP 41.71mmHg. Mod dil LA. Mod MR/TR. Mild AI.    Past Surgical History: Past Surgical History:  Procedure Laterality Date   ABLATION OF DYSRHYTHMIC FOCUS     CARDIAC CATHETERIZATION     COLONOSCOPY  04/18/08   Partial hysterectomy--1979     Thoracentesis   12/20/07    .  Family History: Family History  Problem Relation Age of Onset   Diabetes Other    Breast cancer Paternal Aunt 45   Heart disease Mother    Pancreatic cancer Father    Atrial fibrillation Brother    Heart disease Brother    Heart attack Brother     Social History: Social History   Socioeconomic History   Marital status: Married    Spouse name: Not on file   Number of children: Not on file   Years of education: Not on file   Highest education level: Not on file  Occupational History   Not on file  Tobacco Use   Smoking status: Never   Smokeless tobacco: Never  Vaping Use   Vaping Use: Never used  Substance and Sexual Activity   Alcohol use: No     Alcohol/week: 0.0 standard drinks   Drug use: No   Sexual activity: Never    Birth control/protection: None  Other Topics Concern   Not on file  Social History Narrative   Marital Status: widow x 1 yr   Children: 90, grandchildren 53, numerous great grand children   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 Full Code ( reviewed 2015)       Occasional exercise.   Diet: fruits and veggies, lean meats.   Social Determinants of Health   Financial Resource Strain: Not on file  Food Insecurity: Not on file  Transportation Needs: Not on file  Physical Activity: Not on file  Stress: Not on file  Social Connections: Not on file  Intimate Partner Violence: Not on file    Allergies: Allergies  Allergen Reactions   Amlodipine     Ankle swelling  Celecoxib Other (See Comments) and Rash    Tachycardia/palpitations Other reaction(s): Other Tachycardia/palpitations Tachycardia/palpitations   Tramadol Nausea Only    Current Medications: Current Outpatient Medications  Medication Sig Dispense Refill   amiodarone (PACERONE) 200 MG tablet Take 1 tablet (200 mg total) by mouth 2 (two) times daily for 28 days, THEN 1 tablet (200 mg total) daily. 90 tablet 3   B Complex Vitamins (VITAMIN B COMPLEX) TABS Take 1 tablet by mouth daily.      B Complex-C (SUPER B-COMPLEX + VITAMIN C) TABS TAKE 1 TABLET BY MOUTH AT BEDTIME. 30 tablet 3   chlorthalidone (HYGROTON) 25 MG tablet Take 25 mg by mouth as directed. Take 1/2 tablet daily     cholecalciferol (VITAMIN D3) 25 MCG (1000 UNIT) tablet Take 1,000 Units by mouth daily.     D 1000 25 MCG (1000 UT) capsule TAKE 1 CAPSULE BY MOUTH AT BEDTIME. 30 capsule 3   ELIQUIS 5 MG TABS  tablet TAKE 1 TABLET BY MOUTH TWICE DAILY 180 tablet 1   losartan (COZAAR) 100 MG tablet TAKE 1 TABLET BY MOUTH ONCE DAILY 90 tablet 2   LUMIGAN 0.01 % SOLN Place 1 drop into both eyes 2 (two) times daily.     metoprolol tartrate (LOPRESSOR) 25 MG tablet TAKE 1/2 TABLET AS NEEDED FOR AFIB 30 tablet 1   pantoprazole (PROTONIX) 40 MG tablet Take 1 tablet (40 mg total) by mouth daily. 30 tablet 11   rosuvastatin (CRESTOR) 10 MG tablet Take 1 tablet (10 mg total) by mouth daily. 90 tablet 3   No current facility-administered medications for this visit.    Review of Systems General:  fatigue Skin: no complaints Eyes: no complaints HEENT: no complaints Breasts: no complaints Pulmonary: no complaints Cardiac: no complaints Gastrointestinal: no complaints Genitourinary/Sexual: no complaints Ob/Gyn: no complaints Musculoskeletal: no complaints Hematology: no complaints Neurologic/Psych: no complaints   Objective:  Physical Examination:  BP (!) 164/69    Pulse (!) 51    Temp 98.7 F (37.1 C)    Resp 20    Wt 145 lb 4.8 oz (65.9 kg)    SpO2 100%    BMI 25.74 kg/m    ECOG Performance Status: 1 - Symptomatic but completely ambulatory  GENERAL: Patient is a well appearing female in no acute distress HEENT:  Sclera clear. Anicteric NODES:  Negative axillary, supraclavicular, inguinal lymph node survery LUNGS:  Clear to auscultation bilaterally.   HEART:  Regular rate and rhythm.  ABDOMEN:  Soft, nontender. No masses or ascites EXTREMITIES:  No peripheral edema. Atraumatic. No cyanosis SKIN:  Clear with no obvious rashes or skin changes.  NEURO:  Nonfocal. Well oriented.  Appropriate affect.  Pelvic: deferred.  Lab Review Labs on site today  Radiologic Imaging: Per hpi  Assessment:  JALON BLACKWELDER is a 81 y.o. female diagnosed with incidentally detected complex 5.9 cm left adnexal mass with small mural nodule. Low suspicion for cancer. Normal ca 125. Follow up ultrasound was  stable to smaller. Mural nodule was stable appearing. Clinically, asymptomatic.   Prior hysterectomy.   Medical co-morbidities complicating care: CKD/anemia, bradycardia Plan:   Problem List Items Addressed This Visit   None  She remains asymptomatic. Ultrasound was independently reviewed and cyst appears smaller and single mural nodule within cystic lesion is essentially stable to slightly smaller. She remains asymptomatic. Probability of malignancy is low. CA 1235 was also low and normal. Plan to repeat ultrasound in 6 months to ensure stability and see me  for results.   The patient's diagnosis, an outline of the further diagnostic and laboratory studies which will be required, the recommendation, and alternatives were discussed.  All questions were answered to the patient's satisfaction.  I discussed the assessment and treatment plan with the patient. The patient was provided an opportunity to ask questions and all were answered. The patient agreed with the plan and demonstrated an understanding of the instructions.   The patient was advised to call back or seek an in-person evaluation if the symptoms worsen or if the condition fails to improve as anticipated.   I spent 20 minutes face-to-face visit time dedicated to the care of this patient on the date of this encounter to including pre-visit review of notes, face-to-face time with the patient, and post visit ordering of testing/documentation.   Verlon Au, NP   CC:  [name], MD [address] [place],  [place] [id number] [phone]

## 2021-11-18 ENCOUNTER — Emergency Department: Payer: Medicare Other

## 2021-11-18 ENCOUNTER — Telehealth: Payer: Self-pay

## 2021-11-18 ENCOUNTER — Other Ambulatory Visit: Payer: Self-pay

## 2021-11-18 ENCOUNTER — Emergency Department
Admission: EM | Admit: 2021-11-18 | Discharge: 2021-11-18 | Disposition: A | Discharge status: Home / Self Care | Payer: Medicare Other | Attending: Emergency Medicine | Admitting: Emergency Medicine

## 2021-11-18 ENCOUNTER — Encounter: Payer: Self-pay | Admitting: Emergency Medicine

## 2021-11-18 DIAGNOSIS — Y92000 Kitchen of unspecified non-institutional (private) residence as  the place of occurrence of the external cause: Secondary | ICD-10-CM | POA: Diagnosis not present

## 2021-11-18 DIAGNOSIS — W228XXA Striking against or struck by other objects, initial encounter: Secondary | ICD-10-CM | POA: Diagnosis not present

## 2021-11-18 DIAGNOSIS — R519 Headache, unspecified: Secondary | ICD-10-CM | POA: Diagnosis not present

## 2021-11-18 DIAGNOSIS — S0990XA Unspecified injury of head, initial encounter: Secondary | ICD-10-CM | POA: Diagnosis not present

## 2021-11-18 DIAGNOSIS — Z7901 Long term (current) use of anticoagulants: Secondary | ICD-10-CM | POA: Insufficient documentation

## 2021-11-18 DIAGNOSIS — I129 Hypertensive chronic kidney disease with stage 1 through stage 4 chronic kidney disease, or unspecified chronic kidney disease: Secondary | ICD-10-CM | POA: Diagnosis not present

## 2021-11-18 DIAGNOSIS — S0003XA Contusion of scalp, initial encounter: Secondary | ICD-10-CM | POA: Insufficient documentation

## 2021-11-18 DIAGNOSIS — N189 Chronic kidney disease, unspecified: Secondary | ICD-10-CM | POA: Insufficient documentation

## 2021-11-18 DIAGNOSIS — M542 Cervicalgia: Secondary | ICD-10-CM | POA: Diagnosis not present

## 2021-11-18 IMAGING — CT CT HEAD W/O CM
4 series · 16 of 47 positions shown, 18 images · non-contrast
Comparison: Head CT dated [DATE] and cervical spine radiographs
dated [DATE]

CLINICAL DATA: Posterior head pain and small scalp hematoma after
hitting the back of her head on the underside of a cabinet last
week.



[Series 2: head wo · axial · 0.39mm/px · z∈[+185,+295]mm · 7 of 30 slices shown, 9 images]
[im 4/30  brain]
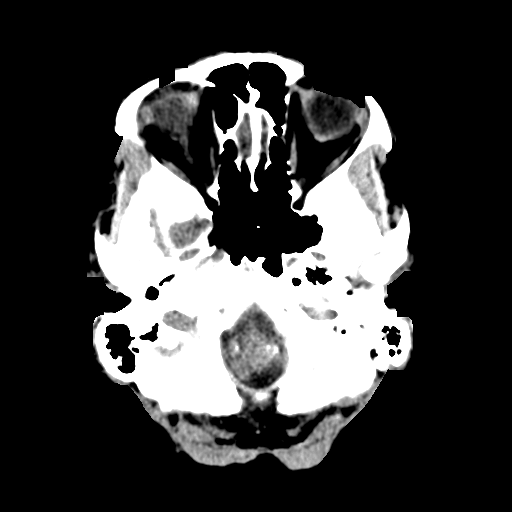
[im 4/30  bone]
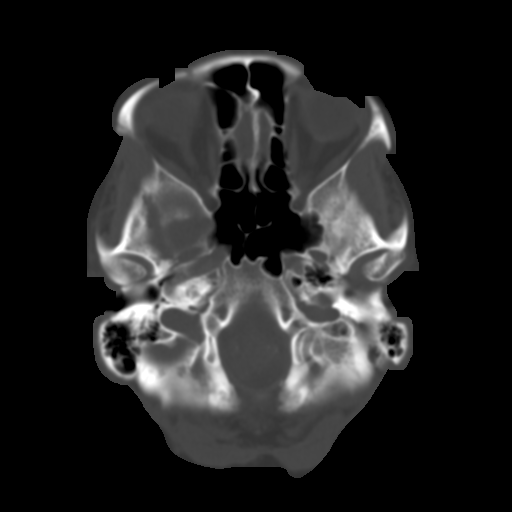
[im 8/30  brain]
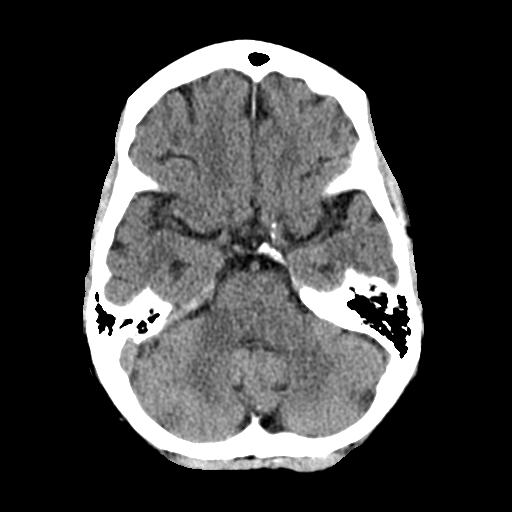
[im 11/30  brain]
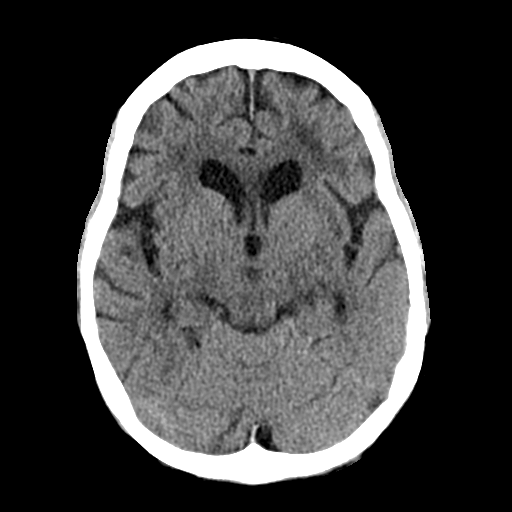
[im 15/30  brain]
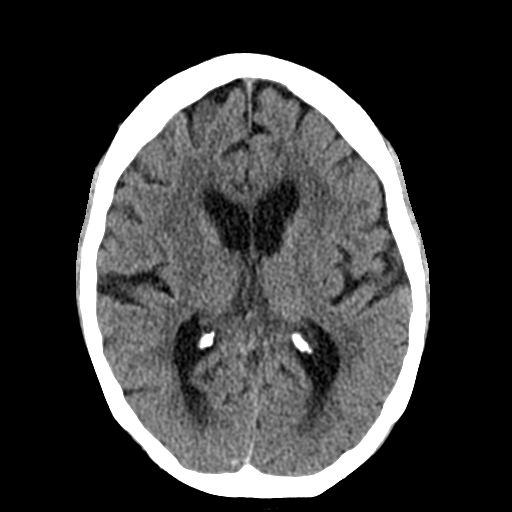
[im 19/30  brain]
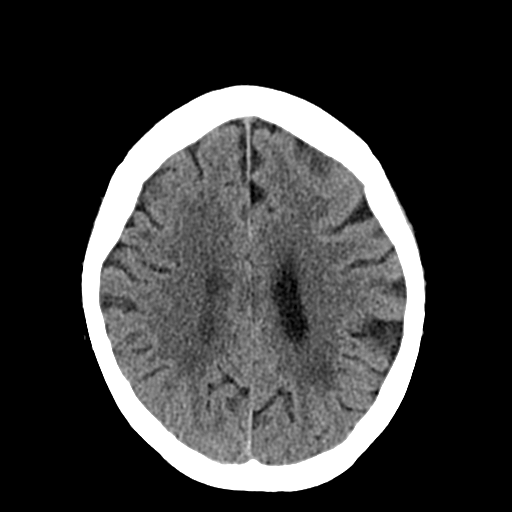
[im 19/30  bone]
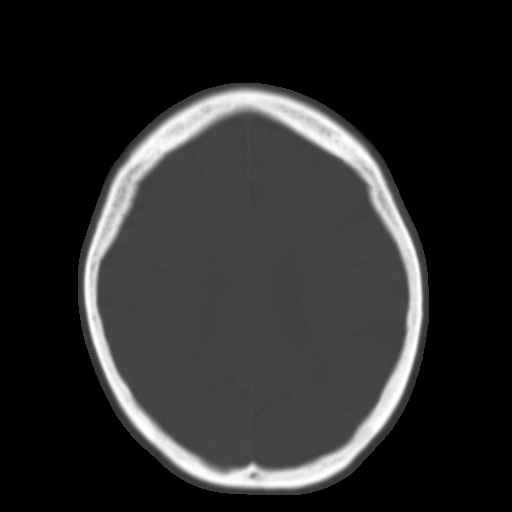
[im 22/30  brain]
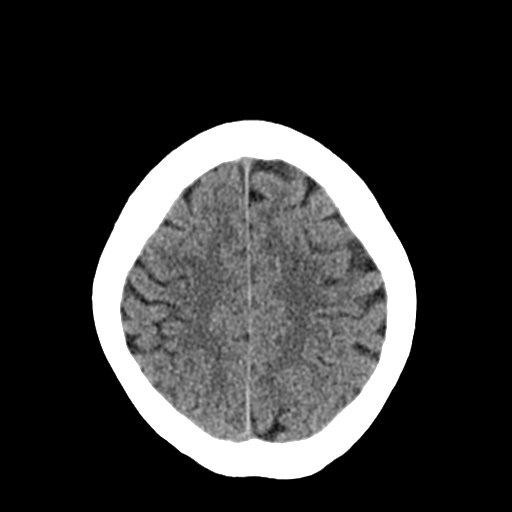
[im 26/30  brain]
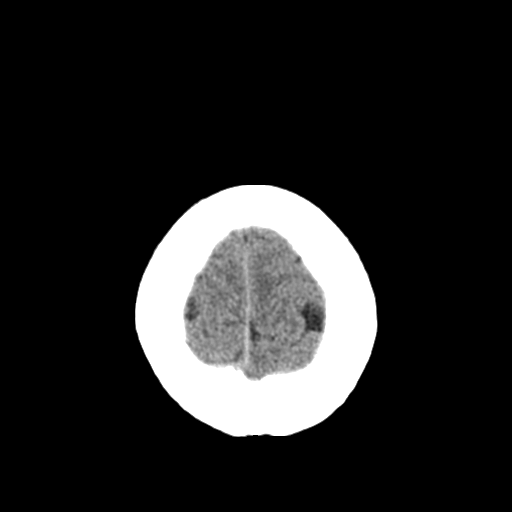

[Series 3: head bone · axial · 0.39mm/px · z∈[+184,+214]mm · 3 of 75 slices shown]
[im 8/75  bone]
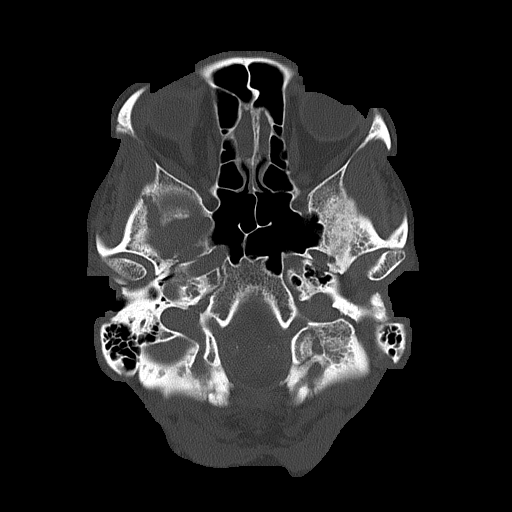
[im 15/75  bone]
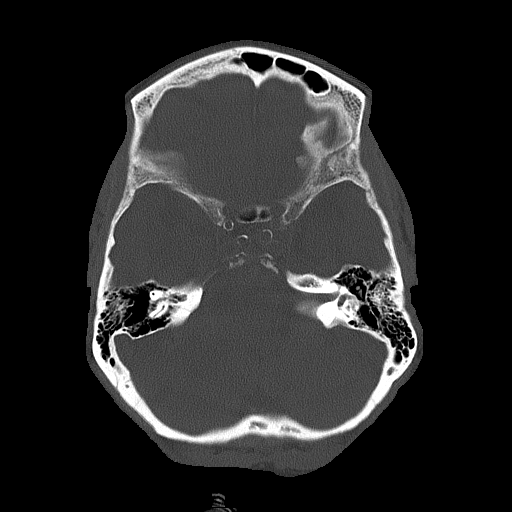
[im 23/75  bone]
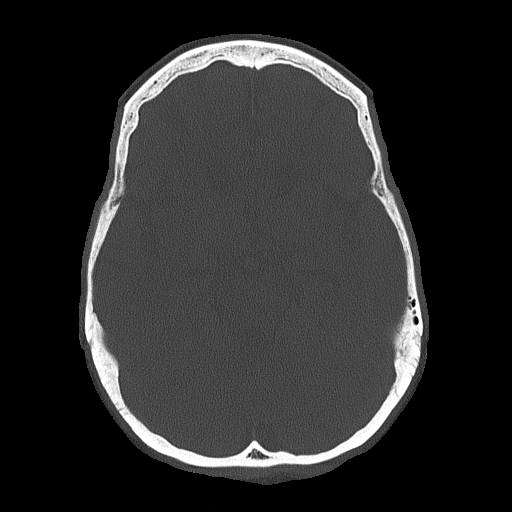

[Series 4: coronal soft tissue · coronal · 0.33mm/px · 3 of 68 slices shown]
[im 23/68  brain]
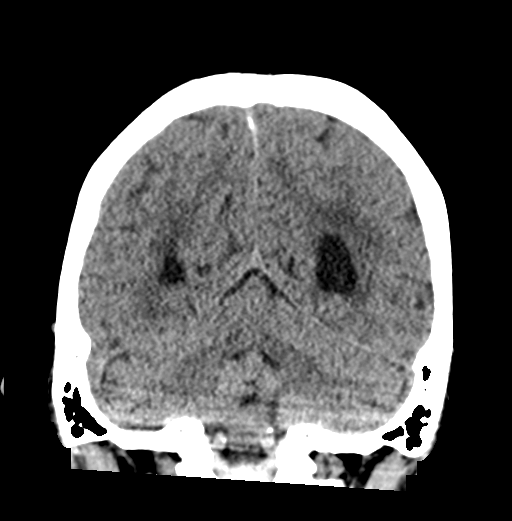
[im 30/68  brain]
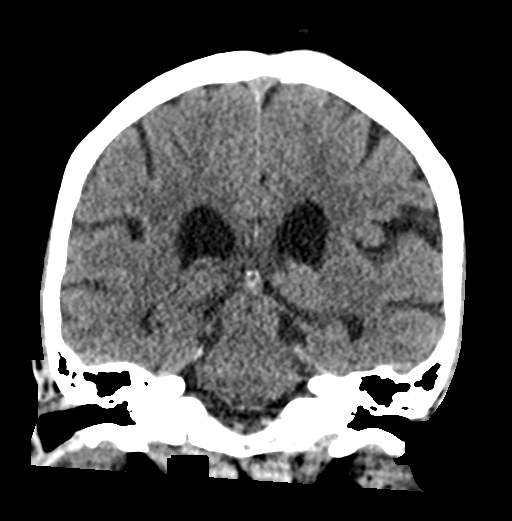
[im 38/68  brain]
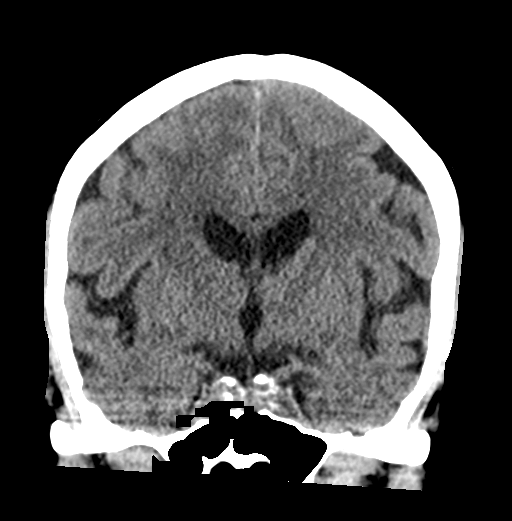

[Series 5: sagittal soft tissue · sagittal · 0.33mm/px · 3 of 53 slices shown]
[im 18/53  brain]
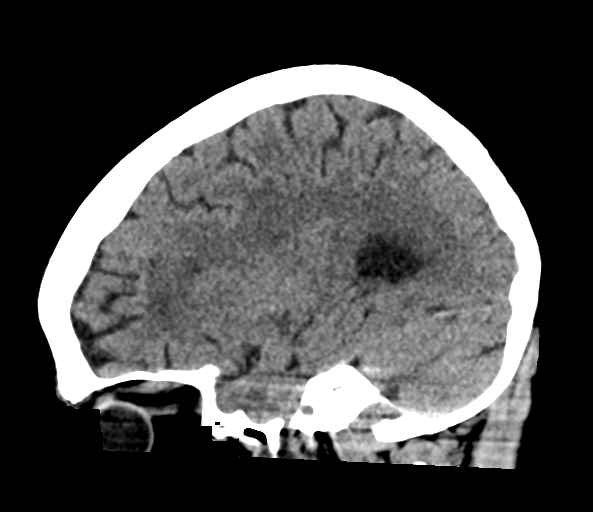
[im 27/53  brain]
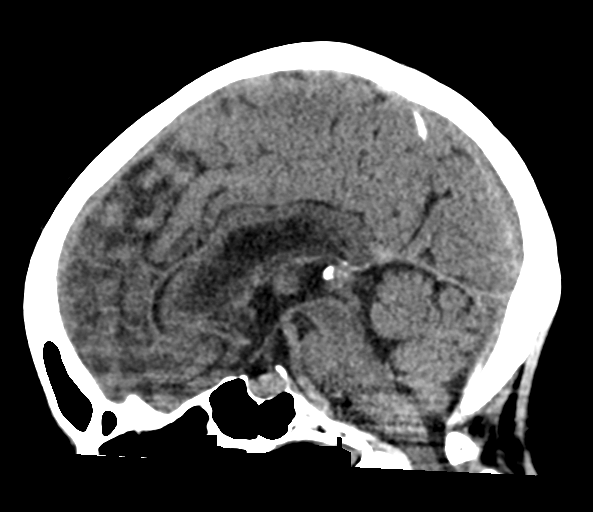
[im 35/53  brain]
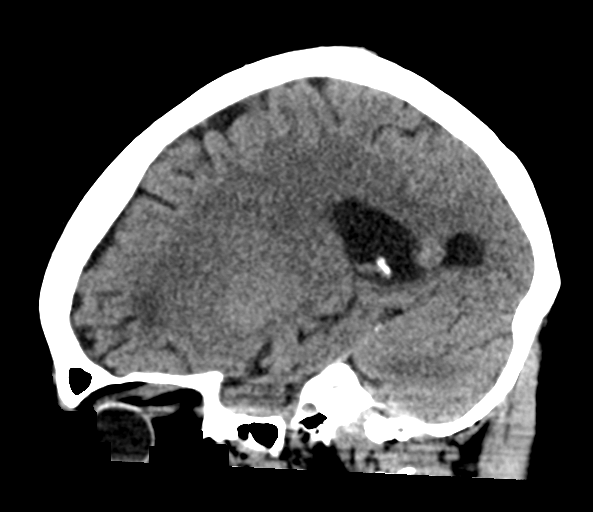

[16 of 47 positions shown; findings below may reference images not displayed]

FINDINGS: CT HEAD FINDINGS

Brain: Stable mildly enlarged ventricles and cortical sulci. Stable
mild patchy white matter low density in both cerebral hemispheres.
No intracranial hemorrhage, mass lesion or CT evidence of acute
infarction.

Vascular: No hyperdense vessel or unexpected calcification.

Skull: Normal. Negative for fracture or focal lesion.

Sinuses/Orbits: Unremarkable.

Other: None.

CT CERVICAL SPINE FINDINGS

Alignment: Normal.

Skull base and vertebrae: No acute fracture. No primary bone lesion
or focal pathologic process.

Soft tissues and spinal canal: No prevertebral fluid or swelling. No
visible canal hematoma.

Disc levels:  Multilevel degenerative changes.

Upper chest: Clear lung apices.

Other: Multiple poorly defined nodules in both lobes of the thyroid
gland with mild enlargement of the left lobe. The largest visible
nodule measures 1.2 cm in maximum diameter.

Mild bilateral carotid artery atheromatous calcifications.
IMPRESSION: 1. No skull fracture or intracranial hemorrhage.
2. No cervical spine fracture or subluxation.
3. Stable mild diffuse cerebral and cerebellar atrophy and mild
chronic white matter small vessel ischemic changes.
4. Multilevel cervical spine degenerative changes.
5. Multinodular thyroid with the largest nodule measuring 1.2 cm in
maximum diameter. Not clinically significant; no follow-up imaging
recommended (ref: [HOSPITAL]. [DATE]): 143-50).
6. Mild bilateral carotid artery atheromatous calcifications.

## 2021-11-18 NOTE — ED Triage Notes (Signed)
Pt comes into the ED via POV c/o head injury last week where she hit the back of her head on the underside of a cabinet.  Pt is on blood thinners, and she still has a small hematoma present and pain to the back of the head.  Pt neurologically intact at this time and denies any other pain or symptoms.  Pt was never evaluated at the first instance of injury.

## 2021-11-18 NOTE — Telephone Encounter (Signed)
Per chart review tab pt went to [facility] ED. Sending note to Dr Diona Browner and Butch Penny CMA.

## 2021-11-18 NOTE — ED Provider Notes (Signed)
[facility] Provider Note    Event Date/Time   First MD Initiated Contact with Patient 11/18/21 1550     (approximate)   History   Head Injury   HPI  [name] is a 81 y.o. female who presents to the ED for evaluation of Head Injury   I reviewed outpatient cardiology visit from 1/30.  History of atrial fibrillation s/p ablation, CKD, HTN and HLD.  On amiodarone and Eliquis.  Patient presents to the ED 1 week after a head injury for evaluation of hematoma to her occiput.  She reports standing in her kitchen when she accidentally struck her occiput to the underside of a cabinet, causing injury.  Denies additional trauma, denies any falls to the ground or any syncopal episodes.  Since that time, she has had a "bump" to her occiput that has been painful.  Slightly improved with Tylenol.  Due to the persistence of this pain and bump, she presents to the ED for evaluation.  She is ambulating independently at her baseline, without fevers, emesis, vision changes or focal weakness.  Physical Exam   Triage Vital Signs: ED Triage Vitals  Enc Vitals Group     BP 11/18/21 1445 (!) 193/61     Pulse Rate 11/18/21 1445 (!) 55     Resp 11/18/21 1445 18     Temp 11/18/21 1445 98.4 F (36.9 C)     Temp Source 11/18/21 1445 Oral     SpO2 11/18/21 1445 100 %     Weight --      Height --      Head Circumference --      Peak Flow --      Pain Score 11/18/21 1449 8     Pain Loc --      Pain Edu? --      Excl. in [place]? --     Most recent vital signs: Vitals:   11/18/21 1445 11/18/21 1628  BP: (!) 193/61 (!) 177/72  Pulse: (!) 55 64  Resp: 18 18  Temp: 98.4 F (36.9 C)   SpO2: 100% 98%    General: Awake, no distress.  Ambulatory with a normal gait independently. CV:  Good peripheral perfusion.  Resp:  Normal effort.  Abd:  No distention.  MSK:  No deformity noted.  Small closed hematoma to the occiput, about 2 cm in diameter.  No laceration or open  injury Neuro:  No focal deficits appreciated. Cranial nerves II through XII intact 5/5 strength and sensation in all 4 extremities Other:     ED Results / Procedures / Treatments   Labs (all labs ordered are listed, but only abnormal results are displayed) Labs Reviewed - No data to display  EKG   RADIOLOGY CT of the head and neck reviewed by me without evidence of acute ICH, skull fracture or cervical fracture.  Official radiology report(s): CT HEAD WO CONTRAST (5MM)  Result Date: 11/18/2021 CLINICAL DATA:  Posterior head pain and small scalp hematoma after hitting the back of her head on the underside of a cabinet last week. EXAM: CT HEAD WITHOUT CONTRAST CT CERVICAL SPINE WITHOUT CONTRAST TECHNIQUE: Multidetector CT imaging of the head and cervical spine was performed following the standard protocol without intravenous contrast. Multiplanar CT image reconstructions of the cervical spine were also generated. RADIATION DOSE REDUCTION: This exam was performed according to the departmental dose-optimization program which includes automated exposure control, adjustment of the mA and/or kV according to patient size  and/or use of iterative reconstruction technique. COMPARISON:  Head CT dated 01/31/2018 and cervical spine radiographs dated 11/20/2007 FINDINGS: CT HEAD FINDINGS Brain: Stable mildly enlarged ventricles and cortical sulci. Stable mild patchy white matter low density in both cerebral hemispheres. No intracranial hemorrhage, mass lesion or CT evidence of acute infarction. Vascular: No hyperdense vessel or unexpected calcification. Skull: Normal. Negative for fracture or focal lesion. Sinuses/Orbits: Unremarkable. Other: None. CT CERVICAL SPINE FINDINGS Alignment: Normal. Skull base and vertebrae: No acute fracture. No primary bone lesion or focal pathologic process. Soft tissues and spinal canal: No prevertebral fluid or swelling. No visible canal hematoma. Disc levels:  Multilevel  degenerative changes. Upper chest: Clear lung apices. Other: Multiple poorly defined nodules in both lobes of the thyroid gland with mild enlargement of the left lobe. The largest visible nodule measures 1.2 cm in maximum diameter. Mild bilateral carotid artery atheromatous calcifications. IMPRESSION: 1. No skull fracture or intracranial hemorrhage. 2. No cervical spine fracture or subluxation. 3. Stable mild diffuse cerebral and cerebellar atrophy and mild chronic white matter small vessel ischemic changes. 4. Multilevel cervical spine degenerative changes. 5. Multinodular thyroid with the largest nodule measuring 1.2 cm in maximum diameter. Not clinically significant; no follow-up imaging recommended (ref: J Am Coll Radiol. 2015 Feb;12(2): 143-50). 6. Mild bilateral carotid artery atheromatous calcifications. Electronically Signed   By: Claudie Revering M.D.   On: 11/18/2021 15:51   CT Cervical Spine Wo Contrast  Result Date: 11/18/2021 CLINICAL DATA:  Posterior head pain and small scalp hematoma after hitting the back of her head on the underside of a cabinet last week. EXAM: CT HEAD WITHOUT CONTRAST CT CERVICAL SPINE WITHOUT CONTRAST TECHNIQUE: Multidetector CT imaging of the head and cervical spine was performed following the standard protocol without intravenous contrast. Multiplanar CT image reconstructions of the cervical spine were also generated. RADIATION DOSE REDUCTION: This exam was performed according to the departmental dose-optimization program which includes automated exposure control, adjustment of the mA and/or kV according to patient size and/or use of iterative reconstruction technique. COMPARISON:  Head CT dated 01/31/2018 and cervical spine radiographs dated 11/20/2007 FINDINGS: CT HEAD FINDINGS Brain: Stable mildly enlarged ventricles and cortical sulci. Stable mild patchy white matter low density in both cerebral hemispheres. No intracranial hemorrhage, mass lesion or CT evidence of acute  infarction. Vascular: No hyperdense vessel or unexpected calcification. Skull: Normal. Negative for fracture or focal lesion. Sinuses/Orbits: Unremarkable. Other: None. CT CERVICAL SPINE FINDINGS Alignment: Normal. Skull base and vertebrae: No acute fracture. No primary bone lesion or focal pathologic process. Soft tissues and spinal canal: No prevertebral fluid or swelling. No visible canal hematoma. Disc levels:  Multilevel degenerative changes. Upper chest: Clear lung apices. Other: Multiple poorly defined nodules in both lobes of the thyroid gland with mild enlargement of the left lobe. The largest visible nodule measures 1.2 cm in maximum diameter. Mild bilateral carotid artery atheromatous calcifications. IMPRESSION: 1. No skull fracture or intracranial hemorrhage. 2. No cervical spine fracture or subluxation. 3. Stable mild diffuse cerebral and cerebellar atrophy and mild chronic white matter small vessel ischemic changes. 4. Multilevel cervical spine degenerative changes. 5. Multinodular thyroid with the largest nodule measuring 1.2 cm in maximum diameter. Not clinically significant; no follow-up imaging recommended (ref: J Am Coll Radiol. 2015 Feb;12(2): 143-50). 6. Mild bilateral carotid artery atheromatous calcifications. Electronically Signed   By: Claudie Revering M.D.   On: 11/18/2021 15:51    PROCEDURES and INTERVENTIONS:  Procedures  Medications - No data to display  IMPRESSION / MDM / ASSESSMENT AND PLAN / ED COURSE  I reviewed the triage vital signs and the nursing notes.  Anticoagulated 81 year old female presents to the ED 1 week after a minor head injury, with evidence of a small persistent hematoma, but otherwise without evidence of acute pathology and suitable for outpatient management.  She looks clinically well with no neurologic or vascular deficits.  Has a small occipital hematoma that is nonbleeding, without injury to require suture or staple repair.  CT imaging of the head and  neck without evidence of ICH or skull fracture.  We discussed more prompt ED evaluation after head injury in the setting of her anticoagulation.      FINAL CLINICAL IMPRESSION(S) / ED DIAGNOSES   Final diagnoses:  Injury of head, initial encounter  Anticoagulated     Rx / DC Orders   ED Discharge Orders     None        Note:  This document was prepared using Dragon voice recognition software and may include unintentional dictation errors.   Vladimir Crofts, MD 11/18/21 1700

## 2021-11-18 NOTE — Telephone Encounter (Signed)
[facility] - Client TELEPHONE ADVICE RECORD [facility] Patient Name: [name] Gender: Female DOB: 1941-03-12 Age: 81 Y 53 M 30 D Return Phone Number: [phone] (Primary) Address: City/ State/ Zip: [place] [address] Client [facility] - Client Client Site [facility] - Day Provider Eliezer Lofts - MD Contact Type Call Who Is Calling Patient / Member / Family / Caregiver Call Type Triage / Clinical Relationship To Patient Self Return Phone Number [phone] (Primary) Chief Complaint HEAD INJURY - and not acting right. Change in behaviour after hitting head. Reason for Call Symptomatic / Request for Health Information Initial Comment The caller states that she hit her head 2 weeks ago and now has a knot on her head that is throbbing. The caller states that she has also been experiencing light-headedness. The caller was transferred by the office -- office states they have no available appointments. Translation No Nurse Assessment Nurse: Raenette Rover, RN, Zella Ball Date/Time (Eastern Time): 11/18/2021 1:42:27 PM Confirm and document reason for call. If symptomatic, describe symptoms. ---The caller states that she hit her head 2 weeks ago and now has a knot on her head that is throbbing. The caller states that she has also been experiencing lightheadedness. The caller was transferred by the office -- office states they have no available appointments. Does the patient have any new or worsening symptoms? ---Yes Will a triage be completed? ---Yes Related visit to physician within the last 2 weeks? ---No Does the PT have any chronic conditions? (i.e. diabetes, asthma, this includes High risk factors for pregnancy, etc.) ---Yes List chronic conditions. ---htn, cad Is this a behavioral health or substance abuse call? ---No Guidelines Guideline Title Affirmed Question Affirmed Notes Nurse  Date/Time (Eastern Time) Head Injury Large swelling or bruise > 2 inches (5 cm) Raenette Rover, RN, Zella Ball 11/18/2021 1:43:56 PM PLEASE NOTE: All timestamps contained within this report are represented as [place] Standard Time. CONFIDENTIALTY NOTICE: This fax transmission is intended only for the addressee. It contains information that is legally privileged, confidential or otherwise protected from use or disclosure. If you are not the intended recipient, you are strictly prohibited from reviewing, disclosing, copying using or disseminating any of this information or taking any action in reliance on or regarding this information. If you have received this fax in error, please notify us immediately by telephone so that we can arrange for its return to [place]. Phone: [phone], Toll-Free: [phone], Fax: [phone] Page: 2 of 2 Call Id: [id number] [place]. Time Eilene Ghazi Time) Disposition Final User 11/18/2021 1:41:07 PM Send to Urgent Queue Milton Ferguson 11/18/2021 1:47:37 PM Go to ED Now Yes Raenette Rover, RN, Herbert Deaner Disagree/Comply Comply Caller Understands Yes PreDisposition InappropriateToAsk Care Advice Given Per Guideline GO TO ED NOW: USE A COLD PACK FOR PAIN, SWELLING, OR BRUISING: * Put a cold pack or an ice bag (wrapped in a moist towel) on the area for 20 minutes. CARE ADVICE given per Head Injury (Adult) guideline. Comments User: Wilson Singer, RN Date/Time Eilene Ghazi Time): 11/18/2021 1:44:54 PM hit her head while under the cabinet User: Wilson Singer, RN Date/Time (Eastern Time): 11/18/2021 1:49:50 PM even though its been two weeks caller states its sore, throbbing in the area and a pretty big knot in the area and an increase in confusion since it happened and currently on blood thinners but cant recall the name of them. Husband plans to take her to the ER they are trying to decide which  one. Referrals GO TO FACILITY UNDECIDE

## 2021-11-18 NOTE — Discharge Instructions (Signed)
Use Tylenol for pain and fevers.  Up to 1000 mg per dose, up to 4 times per day.  Do not take more than 4000 mg of Tylenol/acetaminophen within 24 hours..  

## 2021-12-01 NOTE — Progress Notes (Signed)
Cardiology Office Note:    Date:  12/02/2021   ID:  [name], DOB 1941/02/13, MRN [id number]  PCP:  [name], MD  Cardiologist:  [name], MD   Referring MD: [name], MD   CC: Hypertension  History of Present Illness:    [name] is a 82 y.o. female with a hx of persistent atrial fibrillation status post ablation, bradycardia, CKD 2, mitral regurgitation, aortic regurgitation, hypertension, and hyperlipidemia here for follow up. She had an echo 05/14/2019 that revealed LVEF 60 to 65% with grade 2 diastolic dysfunction and mildly elevated pulmonary pressures.  She was initially seen 12/2020 to establish care in the hypertension clinic.  She was first diagnosed with hypertension many decades ago.  In the past it was well-controlled.  However in the last year she has struggled.  Lately her blood pressure has been in the 160s at home.  She last saw [name] on 11/22/2019.  Prior to that losartan was increased to 50 mg.  Her blood pressure remained above goal since making that change.  [name] increased losartan to 100mg .  She followed up with [name], NP, and hydralazine 25mg  bid was added on 2/26.  At her first visit hydralazine was increased.  She saw [name], NP on 12/2019 an dit was increased to 75mg .  She started to work out at the [employer].  She was seen in the ED with palpitations 03/2020.  She was in sinus rhythm at the time.  This has resolved.   It was noted that she had inadvertently stopped taking her hydrochlorothiazide. She this was resumed when she saw her pharmacist on 03/2020.  Hydralazine was reduced back to 50 mg twice daily.  HCTZ was changed to chlorthalidone. Her lipids were poorly controlled so she was started to rosuvastatin. 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  At her last appointment she reported palpitations but was noted to be in sinus rhythm at that time.  Her renal function was worsening so chlorthalidone was  reduced and she was started on clonidine.  Renal artery dopplers were normal 08/2021.  08/2021 she was seen in the hospital.  She reported shortness of breath and chest pain.  She also had increasing lower extremity edema.  She was noted to be in atrial fibrillation with RVR in the 90s to 150s.  She was given IV diltiazem and IV metoprolol.  High-sensitivity troponin was minimally elevated 22 and flat.  It was thought to be demand ischemia in the setting of A. fib with RVR.  With the initiation of IV diltiazem her heart rate quickly improved.  She was started on metoprolol 12.5 mg twice daily.  Her home hydralazine was held and she was asked to follow-up closely with cardiology. When she followed up in clinic she was bradycardic.  Clonidine and metoprolol were both discontinued she was instructed to take the metoprolol only as needed.  She was continued on losartan and chlorthalidone.  She followed up with EP and remained in sinus rhythm.  Given her chronic kidney disease she was not a candidate for dofetilide.  They discussed ablation versus amiodarone.  She was started on a 2-week monitor to better assess her heart rate and rhythm. She had 20 runs of SVT up to 17 beats. Her average heart rate was 53 bpm. She did have some overnight bradycardia to the mid 30s.   At her last appointment she was feeling better,  with well controlled blood pressure and consistent heart rates in the 40s. She saw [name] 10/12/21 and was doing well. It was noted she had continued episodes of Afib but had been in sinus rhythm for most of the interim. She was started on 200 mg amiodarone twice daily for 1 month followed by 200 mg daily. On 11/18/2021 she presented to the ED 1 week after a head injury due to persistent pain and raised lesion. She had accidentally struck her occiput on the underside of a cabinet. Workup revealed a small, non-bleeding occipital hematoma suitable for outpatient management.   She is accompanied by a  family member. Today, she states she is feeling good overall. On average she believes her blood pressure is in the 160s at home. She usually checks it often, but forgot to bring her log with her today. They agree that a bradycardic heart rate as shown on her EKG is fairly typical for her. She reports that amiodarone has been helping her significantly. Her arrhythmic episodes have been much more controlled. Lately she has been exercising only a little.  She would like to work up to returning to the gym 3 times a week. However, she was limited by a busy schedule and her recent heart issues. Also, she states her diet is "not good." She denies any chest pain, shortness of breath, or peripheral edema. No lightheadedness, headaches, syncope, orthopnea, or PND.  Previous antihypertensives: Amlodipine- ankle swelling Spironolactone- unsure why it was stopped Hydralazine- swelling with 100mg , OK with 50mg  Clonidine- worked well.  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 cath 2/09: no CAD, EF 70%   GERD (gastroesophageal reflux disease) 07/26/2021   HYPERLIPIDEMIA    HYPERTENSION    Mild Aortic insufficiency    Moderate mitral regurgitation    Moderate tricuspid regurgitation    OSTEOPENIA    PAF (paroxysmal atrial fibrillation) ([facility])    a. s/p ablation 2013; b. CHADS2VASc -> 4 (HTN, age x 2, female)-->Eliquis.   PAT (paroxysmal atrial tachycardia) ([facility])    a. 04/2019 Zio: Occas PACs and rare PVCs. 21 atrial runs - longest 20 beats, max rate 169.   Pneumonia 2009   RA (rheumatoid arthritis) ([facility])    Sinus Bradycardia    a. asymptomatic but prevents use of AVN blocking agents; b. 04/2019 Zio: Avg HR 61 (37-109).   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 05/2019 Echo: EF 60-65%.  DD. RVSP 41.14mmHg. Mod dil LA. Mod MR/TR. Mild AI.    Past Surgical History:  Procedure Laterality Date   ABLATION OF DYSRHYTHMIC FOCUS     CARDIAC CATHETERIZATION     COLONOSCOPY  04/18/08   Partial hysterectomy--1979     Thoracentesis   12/20/07      Current Medications: Current Meds  Medication Sig   amiodarone (PACERONE) 200 MG tablet Take 1 tablet (200 mg total) by mouth 2 (two) times daily for 28 days, THEN 1 tablet (200 mg total) daily.   B Complex Vitamins (VITAMIN B COMPLEX) TABS Take 1 tablet by mouth daily.    B Complex-C (SUPER B-COMPLEX + VITAMIN C) TABS TAKE 1 TABLET BY MOUTH AT BEDTIME.   chlorthalidone (HYGROTON) 25 MG tablet Take 25 mg by mouth as directed.  Take 1/2 tablet daily   cholecalciferol (VITAMIN D3) 25 MCG (1000 UNIT) tablet Take 1,000 Units by mouth daily.   D 1000 25 MCG (1000 UT) capsule TAKE 1 CAPSULE BY MOUTH AT BEDTIME.   ELIQUIS 5 MG TABS tablet TAKE 1 TABLET BY MOUTH TWICE DAILY   hydrALAZINE (APRESOLINE) 25 MG tablet Take 1 tablet (25 mg total) by mouth in the morning and at bedtime.   losartan (COZAAR) 100 MG tablet TAKE 1 TABLET BY MOUTH ONCE DAILY   LUMIGAN 0.01 % SOLN Place 1 drop into both eyes 2 (two) times daily.   metoprolol tartrate (LOPRESSOR) 25 MG tablet TAKE 1/2 TABLET AS NEEDED FOR AFIB   pantoprazole (PROTONIX) 40 MG tablet Take 1 tablet (40 mg total) by mouth daily.   rosuvastatin (CRESTOR) 10 MG tablet Take 1 tablet (10 mg total) by mouth daily.     Allergies:   Amlodipine, Celecoxib, and Tramadol   Social History   Socioeconomic History   Marital status: Married    Spouse name: Not on file   Number of children: Not on file   Years of education: Not on file   Highest education level: Not on file  Occupational History   Not on file  Tobacco Use   Smoking status: Never   Smokeless tobacco: Never  Vaping Use   Vaping Use: Never used  Substance and Sexual Activity   Alcohol use: No    Alcohol/week: 0.0 standard drinks    Drug use: No   Sexual activity: Never    Birth control/protection: None  Other Topics Concern   Not on file  Social History Narrative   Marital Status: widow x 1 yr   Children: 26, grandchildren 9, numerous great grand children   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 Full Code ( reviewed 2015)       Occasional exercise.   Diet: fruits and veggies, lean meats.   Social Determinants of Health   Financial Resource Strain: Not on file  Food Insecurity: Not on file  Transportation Needs: Not on file  Physical Activity: Not on file  Stress: Not on file  Social Connections: Not on file     Family History: The patient's family history includes Atrial fibrillation in her brother; Breast cancer (age of onset: 31) in her paternal aunt; Diabetes in an other family member; Heart attack in her brother; Heart disease in her brother and mother; Pancreatic cancer in her father.  ROS:   Please see the history of present illness.    All other systems reviewed and are negative.  EKGs/Labs/Other Studies Reviewed:    EKG:     12/02/2021: Sinus bradycardia. Rate 49 bpm. 08/19/2021: Ectopic atrial bradycardia.  Rate 47 bpm. 07/26/2021: Sinus rhythm Rate 65 03/23/2021: EKG was not ordered. 11/27/2020: Sinus rhythm.  Rate 60 bpm. 12/06/19: sinus bradycardia.  Rate 53 bpm.  Non-specific ST-T changes.  Monitor 09/2021: Patch Wear Time:  12 days and 16 hours   Predominant underlying rhythm was sinus rhythm Less than 1% ventricular and supraventricular ectopy 20 SVT runs, longest 17 beats, fastest 6 beats at 176 bpm No triggered events noted  Bilateral Renal Artery Doppler 09/08/2021: Summary:  Largest Aortic Diameter: 2.1 cm      Renal:     Right:  Abnormal size for the right kidney. Normal cortical thickness         of right kidney. No evidence of right renal artery stenosis.         RRV flow present.  Left:  Cyst(s) noted. LRV flow present. No evidence of left renal         artery stenosis. Abnormal cortical thickness of the left         kidney. Abnormal size for the left kidney.  Mesenteric:  Normal Celiac artery , Superior Mesenteric artery and Inferior Mesenteric  artery findings.   Echo 05/14/19:  1. The left ventricle has normal systolic function with an ejection  fraction of 60-65%. The cavity size was normal. There is mildly increased  left ventricular wall thickness. Left ventricular diastolic Doppler  parameters are consistent with  pseudonormalization.   2. The right ventricle has normal systolic function. The cavity was  normal. There is no increase in right ventricular wall thickness. Right  ventricular systolic pressure is mildly elevated with an estimated  pressure of 41.7 mmHg.   3. Left atrial size was moderately dilated.   4. Incompletely evaluated hypoechoic structure noted in the liver, most  likely a cyst.   5. The mitral valve is degenerative. Mild thickening of the mitral valve  leaflet. Mitral valve regurgitation is moderate by color flow Doppler.   6. Tricuspid valve regurgitation is moderate.   7. The aortic valve has an indeterminate number of cusps. Mild thickening  of the aortic valve. Mild calcification of the aortic valve. Aortic valve  regurgitation is mild by color flow Doppler.   8. The aorta is normal in size and structure.   Recent Labs: 08/17/2021: B Natriuretic Peptide 353.9; TSH 2.214 08/18/2021: Magnesium 2.0 10/18/2021: ALT 20; BUN 29; Creatinine, Ser 1.83; Hemoglobin 10.5; Platelets 166; Potassium 4.1; Sodium 139   Recent Lipid Panel    Component Value Date/Time   CHOL 205 (H) 08/18/2021 0325   TRIG 47 08/18/2021 0325   HDL 78 08/18/2021 0325   CHOLHDL 2.6  08/18/2021 0325   VLDL 9 08/18/2021 0325   LDLCALC 118 (H) 08/18/2021 0325   LDLDIRECT 104.4 11/26/2012 0746    Physical Exam:    VS:  BP (!) 154/68 (BP Location: Right Arm, Patient Position: Sitting, Cuff Size: Normal)    Pulse (!) 49    Ht 5\' 3"  (1.6 m)    Wt 146 lb 4.8 oz (66.4 kg)    BMI 25.92 kg/m  , BMI Body mass index is 25.92 kg/m. GENERAL:  Well appearing HEENT: Pupils equal round and reactive, fundi not visualized, oral mucosa unremarkable NECK:  No jugular venous distention, waveform within normal limits, carotid upstroke brisk and symmetric, no bruits LUNGS:  Clear to auscultation bilaterally HEART:  Bradycardic.  Regular rhythm  PMI not displaced or sustained,S1 and S2 within normal limits, no S3, no S4, no clicks, no rubs, III/VI systolic murmur at the left upper sternal border.  II/VI holosystolic murmur at the apex. ABD:  Flat, positive bowel sounds normal in frequency in pitch, no bruits, no rebound, no guarding, no midline pulsatile mass, no hepatomegaly, no splenomegaly EXT:  2 plus pulses throughout, no edema, no cyanosis no clubbing SKIN:  No rashes no nodules NEURO:  Cranial nerves II through XII grossly intact, motor grossly intact throughout PSYCH:  Cognitively intact, oriented to person place and time   ASSESSMENT:    1. Mitral valve insufficiency, unspecified etiology   2. Stage 3b chronic kidney  disease (CKD) ([facility])   3. HYPERLIPIDEMIA   4. SINUS BRADYCARDIA   5. Persistent atrial fibrillation ([facility])   6. Essential hypertension     PLAN:    Mitral regurgitation Moderate MR.  She is euvolemic and doing well.  Continue chlorthalidone for BP and volume management.  Her last echo was in 2020.  We will get a repeat echo.  Stage 3b chronic kidney disease (CKD) ([facility]) Renal function stable.  Continue losartan.  Consider Farxiga at follow up.  HYPERLIPIDEMIA Continue rosuvastatin.    SINUS BRADYCARDIA Asymptomatic.  Avoid nodal agents.  She takes  metoprolol only when she has A-fib with RVR.  Her arrhythmias have been more stable since starting amiodarone.  She is very adamant about avoiding a pacemaker if at all possible.  Persistent atrial fibrillation Currently in sinus rhythm.  Doing well on amiodarone.  Continue Eliquis.  She will soon be 81 and her dose of Eliquis will need to be reduced to 2.5 mg given her CKD.  Essential hypertension Blood pressure has been poorly controlled lately.  Encouraged her to get back into her exercise routine.  Continue chlorthalidone and losartan.  We are limited by bradycardia and chronic kidney disease.  We will add hydralazine 25 mg twice daily.  Given that she also has bradycardia, would aim for blood pressure <140/90 to avoid dizziness and falls.     Disposition:  FU with APP in 1 month. FU with Kailyn Dubie C. Oval Linsey, MD, [facility] in 4 months.  Medication Adjustments/Labs and Tests Ordered: Current medicines are reviewed at length with the patient today.  Concerns regarding medicines are outlined above.   Orders Placed This Encounter  Procedures   EKG 12-Lead   ECHOCARDIOGRAM COMPLETE   Meds ordered this encounter  Medications   hydrALAZINE (APRESOLINE) 25 MG tablet    Sig: Take 1 tablet (25 mg total) by mouth in the morning and at bedtime.    Dispense:  180 tablet    Refill:  1   I,Mathew Stumpf,acting as a scribe for [name], MD.,have documented all relevant documentation on the behalf of [name], MD,as directed by  [name], MD while in the presence of [name], MD.  I, Antler Oval Linsey, MD have reviewed all documentation for this visit.  The documentation of the exam, diagnosis, procedures, and orders on 12/02/2021 are all accurate and complete.   Signed, [name], MD  12/02/2021 9:13 AM    [place]

## 2021-12-02 ENCOUNTER — Ambulatory Visit (HOSPITAL_BASED_OUTPATIENT_CLINIC_OR_DEPARTMENT_OTHER): Payer: Medicare Other | Admitting: Cardiovascular Disease

## 2021-12-02 ENCOUNTER — Other Ambulatory Visit: Payer: Self-pay

## 2021-12-02 ENCOUNTER — Encounter (HOSPITAL_BASED_OUTPATIENT_CLINIC_OR_DEPARTMENT_OTHER): Payer: Self-pay | Admitting: Cardiovascular Disease

## 2021-12-02 DIAGNOSIS — I340 Nonrheumatic mitral (valve) insufficiency: Secondary | ICD-10-CM | POA: Diagnosis not present

## 2021-12-02 DIAGNOSIS — I4819 Other persistent atrial fibrillation: Secondary | ICD-10-CM | POA: Diagnosis not present

## 2021-12-02 DIAGNOSIS — N1832 Chronic kidney disease, stage 3b: Secondary | ICD-10-CM

## 2021-12-02 DIAGNOSIS — I495 Sick sinus syndrome: Secondary | ICD-10-CM

## 2021-12-02 DIAGNOSIS — E782 Mixed hyperlipidemia: Secondary | ICD-10-CM | POA: Diagnosis not present

## 2021-12-02 DIAGNOSIS — I10 Essential (primary) hypertension: Secondary | ICD-10-CM | POA: Diagnosis not present

## 2021-12-02 MED ORDER — HYDRALAZINE HCL 25 MG PO TABS: 25.0000 mg | ORAL_TABLET | Freq: Two times a day (BID) | ORAL | 1 refills | Status: DC

## 2021-12-02 NOTE — Assessment & Plan Note (Addendum)
Moderate MR.  She is euvolemic and doing well.  Continue chlorthalidone for BP and volume management.  Her last echo was in 2020.  We will get a repeat echo.

## 2021-12-02 NOTE — Assessment & Plan Note (Signed)
Continue rosuvastatin.  

## 2021-12-02 NOTE — Assessment & Plan Note (Signed)
Renal function stable.  Continue losartan.  Consider Farxiga at follow up.

## 2021-12-02 NOTE — Assessment & Plan Note (Signed)
Blood pressure has been poorly controlled lately.  Encouraged her to get back into her exercise routine.  Continue chlorthalidone and losartan.  We are limited by bradycardia and chronic kidney disease.  We will add hydralazine 25 mg twice daily.  Given that she also has bradycardia, would aim for blood pressure <140/90 to avoid dizziness and falls.

## 2021-12-02 NOTE — Assessment & Plan Note (Signed)
Asymptomatic.  Avoid nodal agents.  She takes metoprolol only when she has A-fib with RVR.  Her arrhythmias have been more stable since starting amiodarone.  She is very adamant about avoiding a pacemaker if at all possible.

## 2021-12-02 NOTE — Assessment & Plan Note (Signed)
Currently in sinus rhythm.  Doing well on amiodarone.  Continue Eliquis.  She will soon be 81 and her dose of Eliquis will need to be reduced to 2.5 mg given her CKD.

## 2021-12-02 NOTE — Patient Instructions (Addendum)
Medication Instructions:  START HYDRALAZINE 25 MG TWICE A DAY   *If you need a refill on your cardiac medications before your next appointment, please call your pharmacy*  Lab Work: NONE   Testing/Procedures: Your physician has requested that you have an echocardiogram. Echocardiography is a painless test that uses sound waves to create images of your heart. It provides your doctor with information about the size and shape of your heart and how well your hearts chambers and valves are working. This procedure takes approximately one hour. There are no restrictions for this procedure.  Follow-Up: At [facility], you and your health needs are our priority.  As part of our continuing mission to provide you with exceptional heart care, we have created designated Provider Care Teams.  These Care Teams include your primary Cardiologist (physician) and Advanced Practice Providers (APPs -  Physician Assistants and Nurse Practitioners) who all work together to provide you with the care you need, when you need it.  We recommend signing up for the patient portal called "MyChart".  Sign up information is provided on this After Visit Summary.  MyChart is used to connect with patients for Virtual Visits (Telemedicine).  Patients are able to view lab/test results, encounter notes, upcoming appointments, etc.  Non-urgent messages can be sent to your provider as well.   To learn more about what you can do with MyChart, go to [email or web address].    Your next appointment:   4 month(s)  The format for your next appointment:   In Person  Provider:   [name], MD{  [place] NP [place] NP

## 2021-12-07 DIAGNOSIS — T1511XA Foreign body in conjunctival sac, right eye, initial encounter: Secondary | ICD-10-CM | POA: Diagnosis not present

## 2021-12-21 ENCOUNTER — Ambulatory Visit (INDEPENDENT_AMBULATORY_CARE_PROVIDER_SITE_OTHER): Payer: Medicare Other | Admitting: Family Medicine

## 2021-12-21 ENCOUNTER — Encounter: Payer: Self-pay | Admitting: Family Medicine

## 2021-12-21 ENCOUNTER — Other Ambulatory Visit: Payer: Self-pay

## 2021-12-21 VITALS — BP 142/64 | HR 98 | Temp 97.8°F | Wt 149.6 lb

## 2021-12-21 DIAGNOSIS — R519 Headache, unspecified: Secondary | ICD-10-CM | POA: Diagnosis not present

## 2021-12-21 DIAGNOSIS — I6523 Occlusion and stenosis of bilateral carotid arteries: Secondary | ICD-10-CM | POA: Diagnosis not present

## 2021-12-21 DIAGNOSIS — M47812 Spondylosis without myelopathy or radiculopathy, cervical region: Secondary | ICD-10-CM | POA: Diagnosis not present

## 2021-12-21 MED ORDER — PREDNISONE 20 MG PO TABS: ORAL_TABLET | ORAL | 0 refills | Status: DC

## 2021-12-21 NOTE — Assessment & Plan Note (Addendum)
Acute, mild to moderate ? ?No red flags for intracranial bleed.  No clear need for second CT scan.  She will contact me if any neurologic changes begin or if headache is increasing or if becomes worst headache of life.  Possible mild concussion given mild memory issues, but family states this was likely late there before she had her head. ? ?Her headache is more of a structural pain and likely nerve damage/irritation at the occiput.  Her scalp is tender to palpation. ? ?She can try topical Voltaren gel (NSAIDs orally are contraindicated).  I will treat with a course of prednisone for the possibility of referred pain from cervical spine given her arthritis. ?

## 2021-12-21 NOTE — Progress Notes (Signed)
? ? Patient ID: [name], female    DOB: [birth year], 81 y.o.   MRN: [id number] ? ?This visit was conducted in person. ? ?There were no vitals taken for this visit.  ? ?CC:  ?Chief Complaint  ?Patient presents with  ? Follow-up  ? Head Injury  ?  Patient hit her head on cabinet 2/9 & was seen at ED. [name] was done but patient is still having throbbing pain on the right back side of her head. No dizziness, vision changes or memory changes.   ? ? ?Subjective:  ? ?HPI: ?[name] is a 81 y.o. female presenting on 12/21/2021 for Follow-up and Head Injury (Patient hit her head on cabinet 2/9 & was seen at ED. [name] was done but patient is still having throbbing pain on the right back side of her head. No dizziness, vision changes or memory changes. ) ?History of atrial fibrillation s/p ablation, CKD, HTN and HLD.  On amiodarone and Eliquis. ? ? ED visit reviewed  from 11/18/2021...  hit head 1 week prior hit occiput on cabinet when leaning under.. no real fall or proceeding symptoms. No LOC. ? Use tylenol for headache. ?Pain was worsening.. limiting sleep. ? Initially some confusion. ? No other neuro changes. ? ?CT head and cervical spine reviewed: ?IMPRESSION: ?1. No skull fracture or intracranial hemorrhage. ?2. No cervical spine fracture or subluxation. ?3. Stable mild diffuse cerebral and cerebellar atrophy and mild ?chronic white matter small vessel ischemic changes. ?4. Multilevel cervical spine degenerative changes. ?5. 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 Not clinically significant; no follow-up imaging ?recommended (ref: J Am Coll Radiol. 2015 Feb;12(2): 143-50). ?6. Mild bilateral carotid artery atheromatous calcifications. ? ?  Family is with her today at [place]. ? She reports throbbing in posterior skull... felt like it went away for  a few days but has come back. ?Not worsening. ? Keeping her up at night some. ? No neuro symptoms except some mild memory  issues. ? ? Still left shoulder pain .. [facility] ? [name] shoulder 09/2022:  ? Recommended tylenol.. it is better now. ? ? ? ? ?Relevant past medical, surgical, family and social history reviewed and updated as indicated. Interim medical history since our last visit reviewed. ?Allergies and medications reviewed and updated. ?Outpatient Medications Prior to Visit  ?Medication Sig Dispense Refill  ? amiodarone (PACERONE) 200 MG tablet Take 1 tablet (200 mg total) by mouth 2 (two) times daily for 28 days, THEN 1 tablet (200 mg total) daily. 90 tablet 3  ? B Complex Vitamins (VITAMIN B COMPLEX) TABS Take 1 tablet by mouth daily.     ? B Complex-C (SUPER B-COMPLEX + VITAMIN C) TABS TAKE 1 TABLET BY MOUTH AT BEDTIME. 30 tablet 3  ? chlorthalidone (HYGROTON) 25 MG tablet Take 25 mg by mouth as directed. Take 1/2 tablet daily    ? cholecalciferol (VITAMIN D3) 25 MCG (1000 UNIT) tablet Take 1,000 Units by mouth daily.    ? D 1000 25 MCG (1000 UT) capsule TAKE 1 CAPSULE BY MOUTH AT BEDTIME. 30 capsule 3  ? ELIQUIS 5 MG TABS tablet TAKE 1 TABLET BY MOUTH TWICE DAILY 180 tablet 1  ? hydrALAZINE (APRESOLINE) 25 MG tablet Take 1 tablet (25 mg total) by mouth in the morning and at bedtime. 180 tablet 1  ? losartan (COZAAR) 100 MG tablet TAKE 1 TABLET BY MOUTH ONCE DAILY 90 tablet 2  ? LUMIGAN 0.01 %  SOLN Place 1 drop into both eyes 2 (two) times daily.    ? metoprolol tartrate (LOPRESSOR) 25 MG tablet TAKE 1/2 TABLET AS NEEDED FOR AFIB 30 tablet 1  ? pantoprazole (PROTONIX) 40 MG tablet Take 1 tablet (40 mg total) by mouth daily. 30 tablet 11  ? rosuvastatin (CRESTOR) 10 MG tablet Take 1 tablet (10 mg total) by mouth daily. 90 tablet 3  ? ?No facility-administered medications prior to visit.  ?  ? ?Per HPI unless specifically indicated in ROS section below ?Review of Systems  ?Constitutional:  Negative for fatigue and fever.  ?HENT:  Negative for congestion.   ?Eyes:  Negative for pain.  ?Respiratory:  Negative for cough  and shortness of breath.   ?Cardiovascular:  Negative for chest pain, palpitations and leg swelling.  ?Gastrointestinal:  Negative for abdominal pain.  ?Genitourinary:  Negative for dysuria and vaginal bleeding.  ?Musculoskeletal:  Negative for back pain.  ?Neurological:  Positive for headaches. Negative for dizziness, tremors, seizures, syncope, facial asymmetry, speech difficulty, weakness, light-headedness and numbness.  ?Psychiatric/Behavioral:  Negative for dysphoric mood.   ?Objective:  ?There were no vitals taken for this visit.  ?Wt Readings from Last 3 Encounters:  ?12/02/21 146 lb 4.8 oz (66.4 kg)  ?11/17/21 145 lb 4.8 oz (65.9 kg)  ?10/18/21 144 lb 11.2 oz (65.6 kg)  ?  ?  ?Physical Exam ?Constitutional:   ?   General: She is not in acute distress. ?   Appearance: Normal appearance. She is well-developed. She is not ill-appearing or toxic-appearing.  ?HENT:  ?   Head: Normocephalic.  ?   Comments: Tender to palpation over posterior head at scalp.  No vertebral tenderness.  She does have some decreased range of motion of neck negative Spurling's ?   Right Ear: Hearing, tympanic membrane, ear canal and external ear normal. Tympanic membrane is not erythematous, retracted or bulging.  ?   Left Ear: Hearing, tympanic membrane, ear canal and external ear normal. Tympanic membrane is not erythematous, retracted or bulging.  ?   Nose: No mucosal edema or rhinorrhea.  ?   Right Sinus: No maxillary sinus tenderness or frontal sinus tenderness.  ?   Left Sinus: No maxillary sinus tenderness or frontal sinus tenderness.  ?   Mouth/Throat:  ?   Pharynx: Uvula midline.  ?Eyes:  ?   General: Lids are normal. Lids are everted, no foreign bodies appreciated.  ?   Conjunctiva/sclera: Conjunctivae normal.  ?   Pupils: Pupils are equal, round, and reactive to light.  ?Neck:  ?   Thyroid: No thyroid mass or thyromegaly.  ?   Vascular: No carotid bruit.  ?   Trachea: Trachea normal.  ?Cardiovascular:  ?   Rate and Rhythm:  Normal rate and regular rhythm.  ?   Pulses: Normal pulses.  ?   Heart sounds: Normal heart sounds, S1 normal and S2 normal. No murmur heard. ?  No friction rub. No gallop.  ?Pulmonary:  ?   Effort: Pulmonary effort is normal. No tachypnea or respiratory distress.  ?   Breath sounds: Normal breath sounds. No decreased breath sounds, wheezing, rhonchi or rales.  ?Abdominal:  ?   General: Bowel sounds are normal.  ?   Palpations: Abdomen is soft.  ?   Tenderness: There is no abdominal tenderness.  ?Musculoskeletal:  ?   Right shoulder: No tenderness or bony tenderness.  ?   Cervical back: Normal range of motion and neck supple.  ?Skin: ?  General: Skin is warm and dry.  ?   Findings: No rash.  ?Neurological:  ?   Mental Status: She is alert and oriented to person, place, and time.  ?   GCS: GCS eye subscore is 4. GCS verbal subscore is 5. GCS motor subscore is 6.  ?   Cranial Nerves: No cranial nerve deficit.  ?   Sensory: No sensory deficit.  ?   Motor: No abnormal muscle tone.  ?   Coordination: Coordination normal.  ?   Gait: Gait normal.  ?   Deep Tendon Reflexes: Reflexes are normal and symmetric.  ?   Comments: Nml cerebellar exam ?  ?No papilledema  ?Psychiatric:     ?   Mood and Affect: Mood is not anxious or depressed.     ?   Speech: Speech normal.     ?   Behavior: Behavior normal. Behavior is cooperative.     ?   Thought Content: Thought content normal.     ?   Cognition and Memory: Memory is not impaired. She does not exhibit impaired recent memory or impaired remote memory.     ?   Judgment: Judgment normal.  ? ?   ?Results for orders placed or performed in visit on 10/18/21  ?Iron and TIBC([facility])  ?Result Value Ref Range  ? Iron 62 28 - 170 ug/dL  ? TIBC 326 250 - 450 ug/dL  ? Saturation Ratios 19 10.4 - 31.8 %  ? UIBC 264 ug/dL  ?Ferritin  ?Result Value Ref Range  ? Ferritin 100 11 - 307 ng/mL  ?Vitamin B12  ?Result Value Ref Range  ? Vitamin B-12 1,062 (H) 180 - 914 pg/mL  ?Comprehensive  metabolic panel  ?Result Value Ref Range  ? Sodium 139 135 - 145 mmol/L  ? Potassium 4.1 3.5 - 5.1 mmol/L  ? Chloride 107 98 - 111 mmol/L  ? CO2 27 22 - 32 mmol/L  ? Glucose, Bld 81 70 - 99 mg/dL  ? BUN 29 (H)

## 2021-12-21 NOTE — Patient Instructions (Addendum)
Complete prednisone taper. ? Can apply topical Voltaren cream 4 times daily to left shoulder and can try on back on head. ? Call if not improving as expected if neurologic changes. ?

## 2021-12-21 NOTE — Assessment & Plan Note (Signed)
Noticed incidentally on CT scan of cervical spine.  No fractures were noted.  This could be contributing to her head pain as well as left shoulder pain. ?

## 2021-12-21 NOTE — Assessment & Plan Note (Signed)
New LDL goal less than 70. ?She is currently on rosuvastatin 10 mg daily.  At next check this may need to be increased to get LDL to new goal. ?

## 2021-12-22 ENCOUNTER — Telehealth: Payer: Self-pay

## 2021-12-22 NOTE — Telephone Encounter (Signed)
Dawn with [employer] called asking about pt dosage of medication predniSONE (DELTASONE) 20 MG tablet. Dawn would like a call back to discuss. [phone]. Please advise. ?

## 2021-12-22 NOTE — Telephone Encounter (Signed)
Received scrip for Prednisone quantity does not match directions. You can resend with number 30 or call and change over the phone.  ? ?[phone] ?

## 2021-12-23 MED ORDER — PREDNISONE 20 MG PO TABS: ORAL_TABLET | ORAL | 0 refills | Status: DC

## 2021-12-23 NOTE — Telephone Encounter (Signed)
New rx sent with corrected instructions. ?

## 2021-12-23 NOTE — Addendum Note (Signed)
Addended by: Eliezer Lofts E on: 12/23/2021 08:32 AM ? ? Modules accepted: Orders ? ?

## 2021-12-29 ENCOUNTER — Ambulatory Visit (HOSPITAL_BASED_OUTPATIENT_CLINIC_OR_DEPARTMENT_OTHER): Payer: Medicare Other | Admitting: Family

## 2021-12-29 ENCOUNTER — Encounter (HOSPITAL_BASED_OUTPATIENT_CLINIC_OR_DEPARTMENT_OTHER): Payer: Self-pay | Admitting: Family

## 2021-12-29 ENCOUNTER — Other Ambulatory Visit: Payer: Self-pay

## 2021-12-29 ENCOUNTER — Ambulatory Visit (INDEPENDENT_AMBULATORY_CARE_PROVIDER_SITE_OTHER): Payer: Medicare Other

## 2021-12-29 VITALS — BP 158/76 | HR 64 | Ht 63.0 in | Wt 153.8 lb

## 2021-12-29 DIAGNOSIS — Z79899 Other long term (current) drug therapy: Secondary | ICD-10-CM | POA: Diagnosis not present

## 2021-12-29 DIAGNOSIS — I340 Nonrheumatic mitral (valve) insufficiency: Secondary | ICD-10-CM | POA: Diagnosis not present

## 2021-12-29 DIAGNOSIS — I10 Essential (primary) hypertension: Secondary | ICD-10-CM

## 2021-12-29 DIAGNOSIS — I4819 Other persistent atrial fibrillation: Secondary | ICD-10-CM | POA: Diagnosis not present

## 2021-12-29 DIAGNOSIS — N1832 Chronic kidney disease, stage 3b: Secondary | ICD-10-CM | POA: Diagnosis not present

## 2021-12-29 DIAGNOSIS — D6859 Other primary thrombophilia: Secondary | ICD-10-CM

## 2021-12-29 DIAGNOSIS — I495 Sick sinus syndrome: Secondary | ICD-10-CM | POA: Diagnosis not present

## 2021-12-29 LAB — ECHOCARDIOGRAM COMPLETE
AR max vel: 1.47 cm2
AV Area VTI: 1.62 cm2
AV Area mean vel: 1.54 cm2
AV Mean grad: 7 mmHg
AV Peak grad: 16.3 mmHg
AV Vena cont: 0.24 cm
Ao pk vel: 2.02 m/s
Area-P 1/2: 3.11 cm2
Calc EF: 70.7 %
MV M vel: 5.69 m/s
MV Peak grad: 129.5 mmHg
P 1/2 time: 661 msec
Radius: 0.5 cm
S' Lateral: 2.71 cm
Single Plane A2C EF: 73.5 %
Single Plane A4C EF: 67.5 %

## 2021-12-29 MED ORDER — HYDRALAZINE HCL 50 MG PO TABS: 50.0000 mg | ORAL_TABLET | Freq: Two times a day (BID) | ORAL | 1 refills | Status: DC

## 2021-12-29 NOTE — Patient Instructions (Addendum)
Medication Instructions:  ?Your physician has recommended you make the following change in your medication:  ? ?CHANGE Hydralazine to '50mg'$  twice daily ? ?*If you need a refill on your cardiac medications before your next appointment, please call your pharmacy* ? ? ?Lab Work: ?None ordered today.  ? ?Testing/Procedures: ?None ordered today. Your echocardiogram results will be available in MyChart likely tomorrow. Dr. Blenda Mounts comments will be available in about a week.  ? ? ?Follow-Up: ?At [facility], you and your health needs are our priority.  As part of our continuing mission to provide you with exceptional heart care, we have created designated Provider Care Teams.  These Care Teams include your primary Cardiologist (physician) and Advanced Practice Providers (APPs -  Physician Assistants and Nurse Practitioners) who all work together to provide you with the care you need, when you need it. ? ?We recommend signing up for the patient portal called "MyChart".  Sign up information is provided on this After Visit Summary.  MyChart is used to connect with patients for Virtual Visits (Telemedicine).  Patients are able to view lab/test results, encounter notes, upcoming appointments, etc.  Non-urgent messages can be sent to your provider as well.   ?To learn more about what you can do with MyChart, go to [email or web address].   ? ?Your next appointment:   ?As scheduled in June with Dr. Oval Linsey ? ?Other Instructions ?  ?Heart Healthy Diet Recommendations: ?A low-salt diet is recommended. Meats should be grilled, baked, or boiled. Avoid fried foods. Focus on lean protein sources like fish or chicken with vegetables and fruits. The American Heart Association is a Microbiologist!  American Heart Association Diet and Lifeystyle Recommendations   ? ?Exercise recommendations: ?The American Heart Association recommends 150 minutes of moderate intensity exercise weekly. ?Try 30 minutes of moderate intensity exercise  4-5 times per week. ?This could include walking, jogging, or swimming.  ? ?[facility] ?[facility] ?[place],  [place]  [id number] ?Get Driving Directions ?Main: [phone] ?

## 2021-12-29 NOTE — Progress Notes (Signed)
? ?Office Visit  ?  ?Patient Name: [name] ?Date of Encounter: 12/29/2021 ? ?PCP:  [name], MD ?  ?[place]  ?Cardiologist:  [name], MD  ?Advanced Practice Provider:  No care team member to display ?Electrophysiologist:  None  ? ?Chief Complaint  ?  ?[name] is a 81 y.o. female with a hx of persistent atrial fibrillation s/p ablation, bradycardia, CKD 3, MR, AI, hypertension, hyperlipidemia  presents today for hypertension follow-up ? ?Past Medical History  ?  ?Past Medical History:  ?Diagnosis Date  ? Anemia   ? 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 Qualifier: Diagnosis of  By: [name], [name]    ? Coronary artery disease, non-occlusive   ? a. cath 2/09: no CAD, EF 70%  ? GERD (gastroesophageal reflux disease) 07/26/2021  ? HYPERLIPIDEMIA   ? HYPERTENSION   ? Mild Aortic insufficiency   ? Moderate mitral regurgitation   ? Moderate tricuspid regurgitation   ? OSTEOPENIA   ? PAF (paroxysmal atrial fibrillation) ([place])   ? a. s/p ablation 2013; b. CHADS2VASc -> 4 (HTN, age x 2, female)-->Eliquis.  ? PAT (paroxysmal atrial tachycardia) ([place])   ? a. 04/2019 Zio: Occas PACs and rare PVCs. 21 atrial runs - longest 20 beats, max rate 169.  ? Pneumonia 2009  ? RA (rheumatoid arthritis) ([place])   ? Sinus Bradycardia   ? a. asymptomatic but prevents use of AVN blocking agents; b. 04/2019 Zio: Avg HR 61 (37-109).  ? 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 Valvular heart disease   ? a. 05/2019 Echo: EF 60-65%. DD. RVSP 41.53mHg. Mod dil LA. Mod MR/TR. Mild AI.  ? ?Past Surgical History:  ?Procedure Laterality Date  ? ABLATION OF DYSRHYTHMIC FOCUS    ? CARDIAC CATHETERIZATION    ? COLONOSCOPY  04/18/08  ? Partial hysterectomy--1979    ? Thoracentesis   12/20/07    ? ?Allergies ? ?Allergies  ?Allergen Reactions  ? Amlodipine   ?  Ankle swelling  ? Celecoxib Other (See Comments) and Rash  ?  Tachycardia/palpitations ?Other reaction(s):  Other ?Tachycardia/palpitations ?Tachycardia/palpitations  ? Tramadol Nausea Only  ? ?History of Present Illness  ?  ?Jacqueline RIGHTMYERis a 81y.o. female with a hx of persistent atrial fibrillation s/p ablation, bradycardia, CKD 3, MR, AI, hypertension, hyperlipidemia last seen 12/02/21 by Dr. ROval Linsey ? ?Echo August 2020 with LVEF 60 to 663% grade 2 diastolic dysfunction, mildly elevated PASP.  She was seen 12/2020 to establish in hypertension clinic with Dr. ROval Linsey Prior to that Losartan had been increased to '100mg'$  QD and Hydralazine initiated and subsequently up titrated. Chlorthalidone previously reduced due to renal functiona nd Clonidine initiated. Renal artery dopplers performed 08/2021 were normal. Hospitalization 08/2021 with atrial fib with RVR treated with IV diltiazem and metoprolol. Home hydralazine helda nd discharged on Metoprolol 12.'5mg'$  BID. At follow up noted to be bradycardic so clonidine held and Metoprolol reduced to PRN. Followed up with EP though not dofetilide candidate due to CKD. Discussion of ablation vs amiodarone and 2 week monitor placed revealing 20 runs of SVT up to 17 beats and average HR 53 bpm. She saw Dr. CCurt Bears1/2023 and was started on Amiodarone. 11/18/21 ED visit due to head injury with imaging revealing small, non-bleeding occipital hematoma suitable for outpatient management.  ? ?Last seen in clinic 12/02/21 by Dr. RHaynes Dage Her BP was often 160s at home and bradycardia was unchanged. She was feeling better on Amiodarone in relation  to arrhythmia. Hydralazine was resumed at '25mg'$   BID. Echo was ordered.  ? ?She presents today for follow up with her daughter and granddaughter. Her granddaughter is a Engineering geologist. She shares wince medication changes her SBp at home has been in 140s. Did not remember to bring her BP cuff or log with her. Denies chest pain, lightheadedness, dizziness. Her mild exertional dyspnea is similar compared to previous. She is planning to resume working  out at [employer].  ? ?Previous antihypertensive: ?Amlodipine - ankle swelling ?Spironolactone - unsure why stopped  ?Hydralazine - swelling with '100mg'$ , okay with '50mg'$  ?Clonidine - worked well, stopped for low BP and bradycardia after Metoprolol ? ?EKGs/Labs/Other Studies Reviewed:  ? ?The following studies were reviewed today: ?Monitor 09/2021: ?Patch Wear Time:  12 days and 16 hours ?  ?Predominant underlying rhythm was sinus rhythm ?Less than 1% ventricular and supraventricular ectopy ?20 SVT runs, longest 17 beats, fastest 6 beats at 176 bpm ?No triggered events noted ?  ?Bilateral Renal Artery Doppler 09/08/2021: ?Summary:  ?Largest Aortic Diameter: 2.1 cm  ?   ?Renal:  ?   ?Right: Abnormal size for the right kidney. Normal cortical thickness  ?       of right kidney. No evidence of right renal artery stenosis.  ?       RRV flow present.  ?Left:  Cyst(s) noted. LRV flow present. No evidence of left renal  ?       artery stenosis. Abnormal cortical thickness of the left  ?       kidney. Abnormal size for the left kidney.  ?Mesenteric:  ?Normal Celiac artery , Superior Mesenteric artery and Inferior Mesenteric  ?artery findings.  ?  ?Echo 05/14/19: ? 1. The left ventricle has normal systolic function with an ejection  ?fraction of 60-65%. The cavity size was normal. There is mildly increased  ?left ventricular wall thickness. Left ventricular diastolic Doppler  ?parameters are consistent with  ?pseudonormalization.  ? 2. The right ventricle has normal systolic function. The cavity was  ?normal. There is no increase in right ventricular wall thickness. Right  ?ventricular systolic pressure is mildly elevated with an estimated  ?pressure of 41.7 mmHg.  ? 3. Left atrial size was moderately dilated.  ? 4. Incompletely evaluated hypoechoic structure noted in the liver, most  ?likely a cyst.  ? 5. The mitral valve is degenerative. Mild thickening of the mitral valve  ?leaflet. Mitral valve regurgitation is moderate by  color flow Doppler.  ? 6. Tricuspid valve regurgitation is moderate.  ? 7. The aortic valve has an indeterminate number of cusps. Mild thickening  ?of the aortic valve. Mild calcification of the aortic valve. Aortic valve  ?regurgitation is mild by color flow Doppler.  ? 8. The aorta is normal in size and structure.  ? ?EKG: No EKG today ? ?Recent Labs: ?08/17/2021: B Natriuretic Peptide 353.9; TSH 2.214 ?08/18/2021: Magnesium 2.0 ?10/18/2021: ALT 20; BUN 29; Creatinine, Ser 1.83; Hemoglobin 10.5; Platelets 166; Potassium 4.1; Sodium 139  ?Recent Lipid Panel ?   ?Component Value Date/Time  ? CHOL 205 (H) 08/18/2021 0325  ? TRIG 47 08/18/2021 0325  ? HDL 78 08/18/2021 0325  ? CHOLHDL 2.6 08/18/2021 0325  ? VLDL 9 08/18/2021 0325  ? LDLCALC 118 (H) 08/18/2021 0325  ? LDLDIRECT 104.4 11/26/2012 0746  ? ? ?Risk Assessment/Calculations:  ? ?CHA2DS2-VASc Score = 6  ?This indicates a 9.7% annual risk of stroke. ?The patient's score is based upon: ?CHF History: 1 ?  HTN History: 1 ?Diabetes History: 0 ?Stroke History: 0 ?Vascular Disease History: 1 ?Age Score: 2 ?Gender Score: 1 ?  ?Home Medications  ? ?Current Meds  ?Medication Sig  ? amiodarone (PACERONE) 200 MG tablet Take 1 tablet (200 mg total) by mouth 2 (two) times daily for 28 days, THEN 1 tablet (200 mg total) daily.  ? B Complex Vitamins (VITAMIN B COMPLEX) TABS Take 1 tablet by mouth daily.   ? chlorthalidone (HYGROTON) 25 MG tablet Take 25 mg by mouth as directed. Take 1/2 tablet daily  ? D 1000 25 MCG (1000 UT) capsule TAKE 1 CAPSULE BY MOUTH AT BEDTIME.  ? ELIQUIS 5 MG TABS tablet TAKE 1 TABLET BY MOUTH TWICE DAILY  ? hydrALAZINE (APRESOLINE) 50 MG tablet Take 1 tablet (50 mg total) by mouth in the morning and at bedtime.  ? losartan (COZAAR) 100 MG tablet TAKE 1 TABLET BY MOUTH ONCE DAILY  ? LUMIGAN 0.01 % SOLN Place 1 drop into both eyes 2 (two) times daily.  ? metoprolol tartrate (LOPRESSOR) 25 MG tablet TAKE 1/2 TABLET AS NEEDED FOR AFIB  ? pantoprazole  (PROTONIX) 40 MG tablet Take 1 tablet (40 mg total) by mouth daily.  ? predniSONE (DELTASONE) 20 MG tablet 2 tabs by mouth daily x 5 days, then1 tabs by mouth daily x 5 days  ? rosuvastatin (CRESTOR) 10 MG tablet Take 1 tablet (10 mg total) by mouth dai

## 2022-01-12 ENCOUNTER — Encounter (HOSPITAL_BASED_OUTPATIENT_CLINIC_OR_DEPARTMENT_OTHER): Payer: Self-pay

## 2022-01-13 ENCOUNTER — Ambulatory Visit (HOSPITAL_COMMUNITY)
Admission: RE | Admit: 2022-01-13 | Discharge: 2022-01-13 | Disposition: A | Discharge status: Home / Self Care | Payer: Medicare Other | Source: Ambulatory Visit | Attending: Nurse Practitioner | Admitting: Nurse Practitioner

## 2022-01-13 ENCOUNTER — Encounter (HOSPITAL_COMMUNITY): Payer: Self-pay | Admitting: Nurse Practitioner

## 2022-01-13 VITALS — BP 168/66 | HR 54 | Ht 63.0 in | Wt 146.8 lb

## 2022-01-13 DIAGNOSIS — Z79899 Other long term (current) drug therapy: Secondary | ICD-10-CM | POA: Insufficient documentation

## 2022-01-13 DIAGNOSIS — Z7901 Long term (current) use of anticoagulants: Secondary | ICD-10-CM | POA: Diagnosis not present

## 2022-01-13 DIAGNOSIS — I4891 Unspecified atrial fibrillation: Secondary | ICD-10-CM | POA: Diagnosis not present

## 2022-01-13 DIAGNOSIS — R519 Headache, unspecified: Secondary | ICD-10-CM | POA: Insufficient documentation

## 2022-01-13 DIAGNOSIS — E785 Hyperlipidemia, unspecified: Secondary | ICD-10-CM | POA: Diagnosis not present

## 2022-01-13 DIAGNOSIS — I129 Hypertensive chronic kidney disease with stage 1 through stage 4 chronic kidney disease, or unspecified chronic kidney disease: Secondary | ICD-10-CM | POA: Diagnosis not present

## 2022-01-13 DIAGNOSIS — I4819 Other persistent atrial fibrillation: Secondary | ICD-10-CM | POA: Diagnosis not present

## 2022-01-13 DIAGNOSIS — D6869 Other thrombophilia: Secondary | ICD-10-CM | POA: Diagnosis not present

## 2022-01-13 DIAGNOSIS — G4452 New daily persistent headache (NDPH): Secondary | ICD-10-CM | POA: Diagnosis not present

## 2022-01-13 DIAGNOSIS — I340 Nonrheumatic mitral (valve) insufficiency: Secondary | ICD-10-CM | POA: Insufficient documentation

## 2022-01-13 DIAGNOSIS — N183 Chronic kidney disease, stage 3 unspecified: Secondary | ICD-10-CM | POA: Diagnosis not present

## 2022-01-13 LAB — COMPREHENSIVE METABOLIC PANEL
ALT: 19 U/L (ref 0–44)
AST: 22 U/L (ref 15–41)
Albumin: 3.5 g/dL (ref 3.5–5.0)
Alkaline Phosphatase: 47 U/L (ref 38–126)
Anion gap: 10 (ref 5–15)
BUN: 40 mg/dL — ABNORMAL HIGH (ref 8–23)
CO2: 23 mmol/L (ref 22–32)
Calcium: 9.4 mg/dL (ref 8.9–10.3)
Chloride: 106 mmol/L (ref 98–111)
Creatinine, Ser: 2.95 mg/dL — ABNORMAL HIGH (ref 0.44–1.00)
GFR, Estimated: 16 mL/min — ABNORMAL LOW (ref >60)
Glucose, Bld: 93 mg/dL (ref 70–99)
Potassium: 4.2 mmol/L (ref 3.5–5.1)
Sodium: 139 mmol/L (ref 135–145)
Total Bilirubin: 1.1 mg/dL (ref 0.3–1.2)
Total Protein: 7.1 g/dL (ref 6.5–8.1)

## 2022-01-13 LAB — CBC
HCT: 30.7 % — ABNORMAL LOW (ref 36.0–46.0)
Hemoglobin: 9.7 g/dL — ABNORMAL LOW (ref 12.0–15.0)
MCH: 30 pg (ref 26.0–34.0)
MCHC: 31.6 g/dL (ref 30.0–36.0)
MCV: 95 fL (ref 80.0–100.0)
Platelets: 137 10*3/uL — ABNORMAL LOW (ref 150–400)
RBC: 3.23 MIL/uL — ABNORMAL LOW (ref 3.87–5.11)
RDW: 13.5 % (ref 11.5–15.5)
WBC: 7.1 10*3/uL (ref 4.0–10.5)
nRBC: 0 % (ref 0.0–0.2)

## 2022-01-13 LAB — TSH: TSH: 1.522 u[IU]/mL (ref 0.350–4.500)

## 2022-01-13 MED ORDER — AMIODARONE HCL 200 MG PO TABS: ORAL_TABLET | ORAL | Status: DC

## 2022-01-13 NOTE — Progress Notes (Signed)
? ?Primary Care Physician: [name], MD ?Referring Physician:[name] ?Cardiologist: Dr. Oval Linsey ? ? ?[name] is a 81 y.o. female with a h/o  hx of persistent atrial fibrillation s/p ablation, bradycardia, CKD 3, MR, AI, hypertension, hyperlipidemia, presents today for f/u amiodarone load in January by [name]. She is on 200 mg daily now and is staying in SR. She is c/o of headaches for the last few weeks. Had a hard bump to head back in early [place] went to the ER and had a head CT which did not show any bleeding. The head aches have just started. She denies sleep apnea, no new meds. She has been anemic in the past. No recent blow to the head.   ? ? ?Today, she denies symptoms of palpitations, chest pain, shortness of breath, orthopnea, PND, lower extremity edema, dizziness, presyncope, syncope, or neurologic sequela. The patient is tolerating medications without difficulties and is otherwise without complaint today.  ? ?Past Medical History:  ?Diagnosis Date  ? Anemia   ? 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 Qualifier: Diagnosis of  By: [name], [name]    ? Coronary artery disease, non-occlusive   ? a. cath 2/09: no CAD, EF 70%  ? GERD (gastroesophageal reflux disease) 07/26/2021  ? HYPERLIPIDEMIA   ? HYPERTENSION   ? Mild Aortic insufficiency   ? Moderate mitral regurgitation   ? Moderate tricuspid regurgitation   ? OSTEOPENIA   ? PAF (paroxysmal atrial fibrillation) ([place])   ? a. s/p ablation 2013; b. CHADS2VASc -> 4 (HTN, age x 2, female)-->Eliquis.  ? PAT (paroxysmal atrial tachycardia) ([place])   ? a. 04/2019 Zio: Occas PACs and rare PVCs. 21 atrial runs - longest 20 beats, max rate 169.  ? Pneumonia 2009  ? RA (rheumatoid arthritis) ([place])   ? Sinus Bradycardia   ? a. asymptomatic but prevents use of AVN blocking agents; b. 04/2019 Zio: Avg HR 61 (37-109).  ? 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 Valvular heart disease   ? a. 05/2019 Echo: EF 60-65%. DD. RVSP 41.10mHg. Mod dil LA. Mod  MR/TR. Mild AI.  ? ?Past Surgical History:  ?Procedure Laterality Date  ? ABLATION OF DYSRHYTHMIC FOCUS    ? CARDIAC CATHETERIZATION    ? COLONOSCOPY  04/18/08  ? Partial hysterectomy--1979    ? Thoracentesis   12/20/07    ? ? ?Current Outpatient Medications  ?Medication Sig Dispense Refill  ? B Complex Vitamins (VITAMIN B COMPLEX) TABS Take 1 tablet by mouth daily.     ? chlorthalidone (HYGROTON) 25 MG tablet Take 12.5 mg by mouth as directed.    ? D 1000 25 MCG (1000 UT) capsule TAKE 1 CAPSULE BY MOUTH AT BEDTIME. 30 capsule 3  ? ELIQUIS 5 MG TABS tablet TAKE 1 TABLET BY MOUTH TWICE DAILY 180 tablet 1  ? hydrALAZINE (APRESOLINE) 50 MG tablet Take 1 tablet (50 mg total) by mouth in the morning and at bedtime. 180 tablet 1  ? losartan (COZAAR) 100 MG tablet TAKE 1 TABLET BY MOUTH ONCE DAILY 90 tablet 2  ? LUMIGAN 0.01 % SOLN Place 1 drop into both eyes 2 (two) times daily.    ? metoprolol tartrate (LOPRESSOR) 25 MG tablet TAKE 1/2 TABLET AS NEEDED FOR AFIB 30 tablet 1  ? pantoprazole (PROTONIX) 40 MG tablet Take 1 tablet (40 mg total) by mouth daily. 30 tablet 11  ? rosuvastatin (CRESTOR) 10 MG tablet Take 1 tablet (10 mg total) by mouth daily. 90 tablet 3  ?  amiodarone (PACERONE) 200 MG tablet Taking one tablet by mouth daily    ? ?No current facility-administered medications for this encounter.  ? ? ?Allergies  ?Allergen Reactions  ? Amlodipine   ?  Ankle swelling  ? Celecoxib Other (See Comments) and Rash  ?  Tachycardia/palpitations ?Other reaction(s): Other ?Tachycardia/palpitations ?Tachycardia/palpitations  ? Tramadol Nausea Only  ? ? ?Social History  ? ?Socioeconomic History  ? Marital status: Married  ?  Spouse name: Not on file  ? Number of children: Not on file  ? Years of education: Not on file  ? Highest education level: Not on file  ?Occupational History  ? Not on file  ?Tobacco Use  ? Smoking status: Never  ? Smokeless tobacco: Never  ?Vaping Use  ? Vaping Use: Never used  ?Substance and Sexual Activity   ? Alcohol use: No  ?  Alcohol/week: 0.0 standard drinks  ? Drug use: No  ? Sexual activity: Never  ?  Birth control/protection: None  ?Other Topics Concern  ? Not on file  ?Social History Narrative  ? Marital Status: widow x 1 yr  ? Children: 5, grandchildren 57, numerous great grand children  ? 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 nonsmoker, nondrinker  ? 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   ? Has living will, HCPOA: [place] Diones, daughter. Full Code ( reviewed 2015)  ?    ? Occasional exercise.  ? Diet: fruits and veggies, lean meats.  ? ?Social Determinants of Health  ? ?Financial Resource Strain: Not on file  ?Food Insecurity: Not on file  ?Transportation Needs: Not on file  ?Physical Activity: Not on file  ?Stress: Not on file  ?Social Connections: Not on file  ?Intimate Partner Violence: Not on file  ? ? ?Family History  ?Problem Relation Age of Onset  ? Diabetes Other   ? Breast cancer Paternal Aunt 51  ? Heart disease Mother   ? Pancreatic cancer Father   ? Atrial fibrillation Brother   ? Heart disease Brother   ? Heart attack Brother   ? ? ?ROS- All systems are reviewed and negative except as per the HPI above ? ?Physical Exam: ?Vitals:  ? 01/13/22 0936  ?BP: (!) 168/66  ?Pulse: (!) 54  ?Weight: 66.6 kg  ?Height: '5\' 3"'$  (1.6 m)  ? ?Wt Readings from Last 3 Encounters:  ?01/13/22 66.6 kg  ?12/29/21 69.8 kg  ?12/21/21 67.9 kg  ? ? ?Labs: ?Lab Results  ?Component Value Date  ? NA 139 10/18/2021  ? K 4.1 10/18/2021  ? CL 107 10/18/2021  ? CO2 27 10/18/2021  ? GLUCOSE 81 10/18/2021  ? BUN 29 (H) 10/18/2021  ? CREATININE 1.83 (H) 10/18/2021  ? CALCIUM 9.3 10/18/2021  ? MG 2.0 08/18/2021  ? ?Lab Results  ?Component Value Date  ? INR 1.2 03/18/2020  ? ?Lab Results  ?Component Value Date  ? CHOL 205 (H) 08/18/2021  ? HDL 78 08/18/2021  ? LDLCALC 118  (H) 08/18/2021  ? TRIG 47 08/18/2021  ? ? ? ?GEN- The patient is well appearing, alert and oriented x 3 today.   ?Head- normocephalic, atraumatic ?Eyes-  Sclera clear, conjunctiva pink ?Ears- hearing intact ?Oropharynx- clear ?Neck- supple, no JVP ?Lymph- no cervical lymphadenopathy ?Lungs- Clear to ausculation bilaterally, normal work of breathing ?Heart- Regular rate and rhythm, no murmurs, rubs or gallops, PMI not laterally displaced ?GI- soft, NT,  ND, + BS ?Extremities- no clubbing, cyanosis, or edema ?MS- no significant deformity or atrophy ?Skin- no rash or lesion ?Psych- euthymic mood, full affect ?Neuro- strength and sensation are intact ? ?EKG-Sinus brady at 54 bpm, pr int 150 ms, qrs int 84 ms, qtc 411 ms  ? ? ? ?Assessment and Plan:  ?1. Afib ?Now maintaining  SR with amiodarone 200 mg daily ?Continue amiodarone ?Continue metoprolol tartrate 25 mg 1/2 daily ? ?2. CHA2DS2VASc  score of at least 7 ?Continue eliquis 5 mg bid  ? ?3. H/A's x 3-4 weeks ?No recent head trauma  ?Unsure of etiology ?F/u with PCP  ?Will check TSH/cmet/cbc today and forward to PCP ? ?F/u with Dr. Oval Linsey 03/17/22 and afib clinic as needed ? ?Geroge Baseman Kayleen Memos, ANP-C ?Afib Clinic ?[facility] ?[address] ?[place], [place] [address] ?[phone]  ? ? ?F/u  ? ?F/u with Dr.  Marland Kitchen

## 2022-01-13 NOTE — Progress Notes (Signed)
Noted. ? Agree with appt in office. ?

## 2022-01-14 ENCOUNTER — Encounter (HOSPITAL_COMMUNITY): Payer: Self-pay

## 2022-01-17 ENCOUNTER — Inpatient Hospital Stay: Payer: Medicare Other | Attending: Oncology

## 2022-01-17 DIAGNOSIS — D631 Anemia in chronic kidney disease: Secondary | ICD-10-CM | POA: Insufficient documentation

## 2022-01-17 DIAGNOSIS — N1832 Chronic kidney disease, stage 3b: Secondary | ICD-10-CM | POA: Diagnosis not present

## 2022-01-17 LAB — CBC
HCT: 31.6 % — ABNORMAL LOW (ref 36.0–46.0)
Hemoglobin: 10.3 g/dL — ABNORMAL LOW (ref 12.0–15.0)
MCH: 30.7 pg (ref 26.0–34.0)
MCHC: 32.6 g/dL (ref 30.0–36.0)
MCV: 94 fL (ref 80.0–100.0)
Platelets: 155 10*3/uL (ref 150–400)
RBC: 3.36 MIL/uL — ABNORMAL LOW (ref 3.87–5.11)
RDW: 13.6 % (ref 11.5–15.5)
WBC: 6.3 10*3/uL (ref 4.0–10.5)
nRBC: 0 % (ref 0.0–0.2)

## 2022-01-17 LAB — IRON AND TIBC
Iron: 30 ug/dL (ref 28–170)
Saturation Ratios: 11 % (ref 10.4–31.8)
TIBC: 284 ug/dL (ref 250–450)
UIBC: 254 ug/dL

## 2022-01-17 LAB — FERRITIN: Ferritin: 138 ng/mL (ref 11–307)

## 2022-01-21 ENCOUNTER — Other Ambulatory Visit: Payer: Self-pay | Admitting: Family Medicine

## 2022-01-21 ENCOUNTER — Ambulatory Visit (INDEPENDENT_AMBULATORY_CARE_PROVIDER_SITE_OTHER): Payer: Medicare Other | Admitting: Family Medicine

## 2022-01-21 ENCOUNTER — Encounter: Payer: Self-pay | Admitting: Family Medicine

## 2022-01-21 VITALS — BP 152/84 | HR 56 | Ht 63.0 in | Wt 146.0 lb

## 2022-01-21 DIAGNOSIS — I4891 Unspecified atrial fibrillation: Secondary | ICD-10-CM | POA: Diagnosis not present

## 2022-01-21 DIAGNOSIS — N179 Acute kidney failure, unspecified: Secondary | ICD-10-CM | POA: Diagnosis not present

## 2022-01-21 DIAGNOSIS — I5033 Acute on chronic diastolic (congestive) heart failure: Secondary | ICD-10-CM

## 2022-01-21 DIAGNOSIS — D638 Anemia in other chronic diseases classified elsewhere: Secondary | ICD-10-CM

## 2022-01-21 DIAGNOSIS — R519 Headache, unspecified: Secondary | ICD-10-CM

## 2022-01-21 DIAGNOSIS — N1832 Chronic kidney disease, stage 3b: Secondary | ICD-10-CM | POA: Diagnosis not present

## 2022-01-21 DIAGNOSIS — I10 Essential (primary) hypertension: Secondary | ICD-10-CM | POA: Diagnosis not present

## 2022-01-21 LAB — BASIC METABOLIC PANEL
BUN: 30 mg/dL — ABNORMAL HIGH (ref 6–23)
CO2: 25 mEq/L (ref 19–32)
Calcium: 9.5 mg/dL (ref 8.4–10.5)
Chloride: 103 mEq/L (ref 96–112)
Creatinine, Ser: 2.27 mg/dL — ABNORMAL HIGH (ref 0.40–1.20)
GFR: 20.02 mL/min — ABNORMAL LOW (ref >60.00)
Glucose, Bld: 90 mg/dL (ref 70–99)
Potassium: 4.1 mEq/L (ref 3.5–5.1)
Sodium: 137 mEq/L (ref 135–145)

## 2022-01-21 LAB — POCT URINALYSIS DIPSTICK
Bilirubin, UA: NEGATIVE
Blood, UA: NEGATIVE
Glucose, UA: NEGATIVE
Ketones, UA: NEGATIVE
Leukocytes, UA: NEGATIVE
Nitrite, UA: NEGATIVE
Protein, UA: POSITIVE — AB
Spec Grav, UA: 1.015 (ref 1.010–1.025)
Urobilinogen, UA: 1 E.U./dL
pH, UA: 6 (ref 5.0–8.0)

## 2022-01-21 LAB — SEDIMENTATION RATE: Sed Rate: 99 mm/hr — ABNORMAL HIGH (ref 0–30)

## 2022-01-21 NOTE — Assessment & Plan Note (Signed)
Acute ? ?She is having 2 separate areas of head soreness.  The tenderness over her occiput where she hit it in February seems to be improving over time. ? ?She has a new swelling on the top of her head in the last few weeks.  This area per the husband has decreased in size.  This history is most suggestive of a subcutaneous cyst that is now improving.  On exam a slight raised area is noted on the scalp.  She will treat it with warm compresses and Tylenol as needed. ? ?On exam she does also have some left temporal pain.  She does have left shoulder issues.  Given she is not a great historian regarding whether she has a true headache as opposed to just scalp soreness.  I will send for a sed rate given  polymyalgia rheumatica is on the differential. ? ?

## 2022-01-21 NOTE — Assessment & Plan Note (Signed)
Chronic, poorly controlled ? ?Her blood pressure continues to be above goal.  Given she does not have severe head pain I do not think this is the cause of the blood pressure elevation.  I do instead think that the elevated blood pressure could be contributing to overall head ache. ? ?Given her blood pressure medicine has actively recently been adjusted by cardiology I will ask her to call their office for further adjustments of hydralazine.  Given her decreased renal function I will hold her diuretic.  It is very likely they may need to increase the hydralazine.  She will  follow blood pressure at home and will call cardiology for further recommendations. ?

## 2022-01-21 NOTE — Progress Notes (Signed)
? ? Patient ID: [name], female    DOB: 1941-08-30, 81 y.o.   MRN: [id number] ? ?This visit was conducted in person. ? ?BP (!) 152/84   Pulse (!) 56   Ht 5' 3"  (1.6 m)   Wt 146 lb (66.2 kg)   SpO2 98%   BMI 25.86 kg/m?   ? ?CC:  ?Chief Complaint  ?Patient presents with  ? Headache  ?  Pt stated--having throbbing headache on the left upper side of the head, with knot, worse with pressure.  ? ? ?Subjective:  ? ?HPI: ?[name] is a 81 y.o. female presenting on 01/21/2022 for Headache (Pt stated--having throbbing headache on the left upper side of the head, with knot, worse with pressure.) ? ?[name] was seen on 12/21/2021 following a head injury on November 18, 2021.  She had hit her head on a cabinet and was seen at the emergency room. ?CT head and cervical spine reviewed: ?IMPRESSION: ?1. No skull fracture or intracranial hemorrhage. ?2. No cervical spine fracture or subluxation. ?3. Stable mild diffuse cerebral and cerebellar atrophy and mild ?chronic white matter small vessel ischemic changes. ?4. Multilevel cervical spine degenerative changes. ?5. 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 Not clinically significant; no follow-up imaging ?recommended (ref: J Am Coll Radiol. 2015 Feb;12(2): 143-50). ?6. Mild bilateral carotid artery atheromatous calcifications. ?  ?At that time headache was felt to be more superficial likely due to nerve damage and irritation as her scalp was tender to palpation.  She was given a course of prednisone for possible referred pain from her cervical spine. ?BP Readings from Last 3 Encounters:  ?01/21/22 (!) 152/84  ?01/13/22 (!) 168/66  ?12/29/21 (!) 158/76  ? ?I reviewed recent office visit note from cardiology on 12/29/21 ?Was seen at that time for persistent atrial fibrillation status [name] ablation, bradycardia and hypertension. ?At that point given her blood pressure had improved but is not at goal, her hydralazine was  increased to 50 mg twice daily.  Chlorthalidone 25 mg daily and losartan 100 mg daily were continued. ?   ?She was also seen on 01/13/2022 at the atrial fibrillation clinic.  Labs were ordered including TSH c-Met and CBC. ?These showed slight great decrease in hemoglobin to 9.7.  Platelets at 137 ?Her creatinine had decreased from baseline chronic kidney disease with creatinine 1.6-1.8.... New creatinine at 2.95 with GFR at 16. ? ?Of note, she also had repeat labs per hematology showing improved anemia hemoglobin 1.3 and normal iron studies. ? ? ? She feels that head pain at site injury improved some.Marland Kitchen area where hit head.. swelling went down. ? Still left shoulder pain. ? ?2 weeks ago started having intermittent pain in posterior upper head... noted lump on scalp.. has had decrease in size. Area throbs off  and on, decrease in soreness as size has decreased. ? No redness. ?BP at home remain> 140/90 ? ? Drinks water daily, no clear source of dehydration. ? She has been taking tylenol... no ibuprofen, aleve. ? ? No burning with urination, nocturia ( 2-3 times per night ) ? Only new med changes.. hydralazine added back... has been on lsartan and chlorthalidone long term. ?No swelling in akles. ? She is on chlorthalidone for  CHF ? ?Relevant past medical, surgical, family and social history reviewed and updated as indicated. Interim medical history since our last visit reviewed. ?Allergies and medications reviewed and updated. ?Outpatient Medications Prior to Visit  ?Medication Sig  Dispense Refill  ? amiodarone (PACERONE) 200 MG tablet Taking one tablet by mouth daily    ? B Complex Vitamins (VITAMIN B COMPLEX) TABS Take 1 tablet by mouth daily.     ? chlorthalidone (HYGROTON) 25 MG tablet Take 12.5 mg by mouth as directed.    ? D 1000 25 MCG (1000 UT) capsule TAKE 1 CAPSULE BY MOUTH AT BEDTIME. 30 capsule 3  ? ELIQUIS 5 MG TABS tablet TAKE 1 TABLET BY MOUTH TWICE DAILY 180 tablet 1  ? hydrALAZINE (APRESOLINE) 50 MG  tablet Take 1 tablet (50 mg total) by mouth in the morning and at bedtime. 180 tablet 1  ? losartan (COZAAR) 100 MG tablet TAKE 1 TABLET BY MOUTH ONCE DAILY 90 tablet 2  ? LUMIGAN 0.01 % SOLN Place 1 drop into both eyes 2 (two) times daily.    ? metoprolol tartrate (LOPRESSOR) 25 MG tablet TAKE 1/2 TABLET AS NEEDED FOR AFIB 30 tablet 1  ? pantoprazole (PROTONIX) 40 MG tablet Take 1 tablet (40 mg total) by mouth daily. 30 tablet 11  ? rosuvastatin (CRESTOR) 10 MG tablet Take 1 tablet (10 mg total) by mouth daily. 90 tablet 3  ? ?No facility-administered medications prior to visit.  ?  ? ?Per HPI unless specifically indicated in ROS section below ?Review of Systems  ?Constitutional:  Negative for fatigue and fever.  ?HENT:  Negative for congestion.   ?Eyes:  Negative for pain.  ?Respiratory:  Negative for cough and shortness of breath.   ?Cardiovascular:  Negative for chest pain, palpitations and leg swelling.  ?Gastrointestinal:  Negative for abdominal pain.  ?Genitourinary:  Negative for dysuria and vaginal bleeding.  ?Musculoskeletal:  Negative for back pain.  ?Neurological:  Negative for syncope, light-headedness and headaches.  ?Psychiatric/Behavioral:  Negative for dysphoric mood.   ?Objective:  ?BP (!) 152/84   Pulse (!) 56   Ht 5' 3"  (1.6 m)   Wt 146 lb (66.2 kg)   SpO2 98%   BMI 25.86 kg/m?   ?Wt Readings from Last 3 Encounters:  ?01/21/22 146 lb (66.2 kg)  ?01/13/22 146 lb 12.8 oz (66.6 kg)  ?12/29/21 153 lb 12.8 oz (69.8 kg)  ?  ?  ?Physical Exam ?Vitals and nursing note reviewed.  ?Constitutional:   ?   General: She is not in acute distress. ?   Appearance: Normal appearance. She is well-developed. She is not ill-appearing or toxic-appearing.  ?HENT:  ?   Head: Normocephalic.  ?   Right Ear: Hearing, tympanic membrane, ear canal and external ear normal.  ?   Left Ear: Hearing, tympanic membrane, ear canal and external ear normal.  ?   Nose: Nose normal.  ?Eyes:  ?   General: Lids are normal. Lids are  everted, no foreign bodies appreciated.  ?   Conjunctiva/sclera: Conjunctivae normal.  ?   Pupils: Pupils are equal, round, and reactive to light.  ?Neck:  ?   Thyroid: No thyroid mass or thyromegaly.  ?   Vascular: No carotid bruit.  ?   Trachea: Trachea normal.  ?Cardiovascular:  ?   Rate and Rhythm: Normal rate and regular rhythm.  ?   Heart sounds: Normal heart sounds, S1 normal and S2 normal. No murmur heard. ?  No gallop.  ?Pulmonary:  ?   Effort: Pulmonary effort is normal. No respiratory distress.  ?   Breath sounds: Normal breath sounds. No wheezing, rhonchi or rales.  ?Abdominal:  ?   General: Bowel sounds are normal. There  is no distension or abdominal bruit.  ?   Palpations: Abdomen is soft. There is no fluid wave or mass.  ?   Tenderness: There is no abdominal tenderness. There is no guarding or rebound.  ?   Hernia: No hernia is present.  ?Musculoskeletal:  ?   Right shoulder: Normal range of motion.  ?   Left shoulder: Tenderness present. Decreased range of motion.  ?   Cervical back: Normal range of motion and neck supple.  ?Lymphadenopathy:  ?   Cervical: No cervical adenopathy.  ?Skin: ?   General: Skin is warm and dry.  ?   Findings: No rash.  ?   Comments: Small flat non-mobile area of swelling on superior scalp, tender to palpation ?Prominent bone on posterior occiput that is tender to touch. ? ?No skin changes  ?Neurological:  ?   Mental Status: She is alert.  ?   Cranial Nerves: No cranial nerve deficit.  ?   Sensory: No sensory deficit.  ?Psychiatric:     ?   Mood and Affect: Mood is not anxious or depressed.     ?   Speech: Speech normal.     ?   Behavior: Behavior normal. Behavior is cooperative.     ?   Judgment: Judgment normal.  ? ?   ?Results for orders placed or performed in visit on 01/17/22  ?Iron and TIBC  ?Result Value Ref Range  ? Iron 30 28 - 170 ug/dL  ? TIBC 284 250 - 450 ug/dL  ? Saturation Ratios 11 10.4 - 31.8 %  ? UIBC 254 ug/dL  ?Ferritin  ?Result Value Ref Range  ?  Ferritin 138 11 - 307 ng/mL  ?CBC  ?Result Value Ref Range  ? WBC 6.3 4.0 - 10.5 K/uL  ? RBC 3.36 (L) 3.87 - 5.11 MIL/uL  ? Hemoglobin 10.3 (L) 12.0 - 15.0 g/dL  ? HCT 31.6 (L) 36.0 - 46.0 %  ? MCV 94.0 80.0 - 100.0 fL  ?

## 2022-01-21 NOTE — Assessment & Plan Note (Signed)
Stable fluid status at this time. ?

## 2022-01-21 NOTE — Patient Instructions (Addendum)
Contact Cardiology for recommendation on increasing BP medication to get BP to goal < 140/90. ? Keep up with water intake. Hold chlorthalidone for now. Follow BPs at home. ? Please stop at the lab to have labs drawn. ?Use tylenol for head soreness from likely cyst. ?Start warm compresses for cyst if achy ?

## 2022-01-21 NOTE — Assessment & Plan Note (Signed)
Acute worsening of chronic stage III kidney disease. ? ?He has had multiple medication changes in the last several months since has been treated for atrial fibrillation and poorly controlled hypertension. ?There were no nephrotoxic antibiotics given.  I will have her hold her diuretic as she may be dehydrated causing this recent worsening.  She will increase water intake.  We will recheck her kidney function today.  Urinalysis did show some evidence of 1+ protein loss but she does not have clear evidence of nephritis or nephrotic syndrome.  We may need to consider holding her ARB.  Given her decreased renal function even at baseline I will refer her to nephrology for further work-up . ?

## 2022-01-21 NOTE — Assessment & Plan Note (Addendum)
On recheck her chronic anemia appears to be stable with a hemoglobin of 10.3.  Per hematology's (Dr. Janese Banks) note there is no indication for EPO injection unless hemoglobin is less than 10. ? ?Also no current iron deficiency ?

## 2022-01-28 ENCOUNTER — Other Ambulatory Visit (HOSPITAL_BASED_OUTPATIENT_CLINIC_OR_DEPARTMENT_OTHER): Payer: Self-pay | Admitting: Cardiovascular Disease

## 2022-01-31 NOTE — Telephone Encounter (Signed)
Rx(s) sent to pharmacy electronically.  

## 2022-02-02 ENCOUNTER — Encounter: Payer: Self-pay | Admitting: *Deleted

## 2022-02-03 ENCOUNTER — Ambulatory Visit (HOSPITAL_BASED_OUTPATIENT_CLINIC_OR_DEPARTMENT_OTHER): Payer: Medicare Other | Admitting: Family

## 2022-02-03 ENCOUNTER — Encounter (HOSPITAL_BASED_OUTPATIENT_CLINIC_OR_DEPARTMENT_OTHER): Payer: Self-pay | Admitting: Family

## 2022-02-03 VITALS — BP 150/60 | HR 57 | Ht 63.0 in | Wt 148.7 lb

## 2022-02-03 DIAGNOSIS — N1832 Chronic kidney disease, stage 3b: Secondary | ICD-10-CM

## 2022-02-03 DIAGNOSIS — I340 Nonrheumatic mitral (valve) insufficiency: Secondary | ICD-10-CM | POA: Diagnosis not present

## 2022-02-03 DIAGNOSIS — E782 Mixed hyperlipidemia: Secondary | ICD-10-CM | POA: Diagnosis not present

## 2022-02-03 DIAGNOSIS — D6859 Other primary thrombophilia: Secondary | ICD-10-CM

## 2022-02-03 DIAGNOSIS — I10 Essential (primary) hypertension: Secondary | ICD-10-CM | POA: Diagnosis not present

## 2022-02-03 NOTE — Patient Instructions (Signed)
Medication Instructions:  ?Continue your current medications.  ? ?*If you need a refill on your cardiac medications before your next appointment, please call your pharmacy* ? ? ?Lab Work: ?None ordered today.  ? ?Testing/Procedures: ?None ordered today.  ? ?Follow-Up: ?At [facility], you and your health needs are our priority.  As part of our continuing mission to provide you with exceptional heart care, we have created designated Provider Care Teams.  These Care Teams include your primary Cardiologist (physician) and Advanced Practice Providers (APPs -  Physician Assistants and Nurse Practitioners) who all work together to provide you with the care you need, when you need it. ? ?We recommend signing up for the patient portal called "MyChart".  Sign up information is provided on this After Visit Summary.  MyChart is used to connect with patients for Virtual Visits (Telemedicine).  Patients are able to view lab/test results, encounter notes, upcoming appointments, etc.  Non-urgent messages can be sent to your provider as well.   ?To learn more about what you can do with MyChart, go to [email or web address].   ? ?Your next appointment:   ?As scheduled ? ? ?Other Instructions ?Your home blood pressure cuff was found to be 8 points lower in the office. If your blood pressure is consistently more than 132 on your home cuff for the top number, then call us and let us know.  ? ?Your Primary Care referred you to nephrology. You can call them in about a week to schedule an appointment if you do not hear from them: ?[place] ?[place], [place] [address] ?Phone: [phone] ? ?Important Information About Sugar ? ? ? ? ?  ?

## 2022-02-03 NOTE — Progress Notes (Signed)
? ?Office Visit  ?  ?Patient Name: SAIDA LONON ?Date of Encounter: 02/03/2022 ? ?PCP:  [name], MD ?  ?[place]  ?Cardiologist:  [name], MD  ?Advanced Practice Provider:  No care team member to display ?Electrophysiologist:  None  ? ?Chief Complaint  ?  ?GENEVIE ELMAN is a 81 y.o. female with a hx of persistent atrial fibrillation s/p ablation, bradycardia, CKD 3, MR, AI, hypertension, hyperlipidemia  presents today for hypertension follow-up ? ?Past Medical History  ?  ?Past Medical History:  ?Diagnosis Date  ? Anemia   ? 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 Qualifier: Diagnosis of  By: [name], [name]    ? Coronary artery disease, non-occlusive   ? a. cath 2/09: no CAD, EF 70%  ? GERD (gastroesophageal reflux disease) 07/26/2021  ? HYPERLIPIDEMIA   ? HYPERTENSION   ? Mild Aortic insufficiency   ? Moderate mitral regurgitation   ? Moderate tricuspid regurgitation   ? OSTEOPENIA   ? PAF (paroxysmal atrial fibrillation) ([place])   ? a. s/p ablation 2013; b. CHADS2VASc -> 4 (HTN, age x 2, female)-->Eliquis.  ? PAT (paroxysmal atrial tachycardia) ([place])   ? a. 04/2019 Zio: Occas PACs and rare PVCs. 21 atrial runs - longest 20 beats, max rate 169.  ? Pneumonia 2009  ? RA (rheumatoid arthritis) ([place])   ? Sinus Bradycardia   ? a. asymptomatic but prevents use of AVN blocking agents; b. 04/2019 Zio: Avg HR 61 (37-109).  ? 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 Valvular heart disease   ? a. 05/2019 Echo: EF 60-65%. DD. RVSP 41.78mHg. Mod dil LA. Mod MR/TR. Mild AI.  ? ?Past Surgical History:  ?Procedure Laterality Date  ? ABLATION OF DYSRHYTHMIC FOCUS    ? CARDIAC CATHETERIZATION    ? COLONOSCOPY  04/18/08  ? Partial hysterectomy--1979    ? Thoracentesis   12/20/07    ? ?Allergies ? ?Allergies  ?Allergen Reactions  ? Amlodipine   ?  Ankle swelling  ? Celecoxib Other (See Comments) and Rash  ?  Tachycardia/palpitations ?Other reaction(s):  Other ?Tachycardia/palpitations ?Tachycardia/palpitations  ? Tramadol Nausea Only  ? ?History of Present Illness  ?  ?CBRIEL GALLICCHIOis a 81y.o. female with a hx of persistent atrial fibrillation s/p ablation, bradycardia, CKD 3, MR, AI, hypertension, hyperlipidemia last seen 01/13/22 by atrial fibrillation clinic.  ? ?Echo August 2020 with LVEF 60 to 639% grade 2 diastolic dysfunction, mildly elevated PASP.  She was seen 12/2020 to establish in hypertension clinic with Dr. ROval Linsey Prior to that Losartan had been increased to '100mg'$  QD and Hydralazine initiated and subsequently up titrated. Chlorthalidone previously reduced due to renal functiona nd Clonidine initiated. Renal artery dopplers performed 08/2021 were normal. Hospitalization 08/2021 with atrial fib with RVR treated with IV diltiazem and metoprolol. Home hydralazine helda nd discharged on Metoprolol 12.'5mg'$  BID. At follow up noted to be bradycardic so clonidine held and Metoprolol reduced to PRN. Followed up with EP though not dofetilide candidate due to CKD. Discussion of ablation vs amiodarone and 2 week monitor placed revealing 20 runs of SVT up to 17 beats and average HR 53 bpm. She saw Dr. CCurt Bears1/2023 and was started on Amiodarone. 11/18/21 ED visit due to head injury with imaging revealing small, non-bleeding occipital hematoma suitable for outpatient management.  ? ?When seen 12/02/21 by Dr. ROval LinseyBP at home in the 160s and Hydralazine resumed at '25mg'$  BID. Echo 12/2021 with normal LVEF 600-65%, moderate  MR, mild to moderate AI. Seen 12/29/21 and Hydralazine increased to '50mg'$  BID due to uncontrolled hypertension. Atrial fib clinic visit 01/13/22 found to be maintaining NSR and Amiodarone, Metoprolol continued.  ? ?She presents today for follow up with her daughter. Very pleasant lady. Her granddaughter is a Heart & Vascular RN. Since last seen her PCP discontinued her Chlorthalidone due to AKI, labs detailed below. Home BP cuff brought today to  assess accuracy: manual check 150/60, her Omron arm cuff 142/69. Home systolic readings over the last week when adjusted for accuracy are 122, 135, 154, 121, 136, 158, 124. She has not yet joined [employer] [facility]. Reports no shortness of breath nor dyspnea on exertion. Reports no chest pain, pressure, or tightness. No edema, orthopnea, PND. Reports no palpitations.   ? ?10/18/21 creatinine 1.83  ?01/13/22 creatinine 2.95 ?01/21/22 creatinine 2.27 (GFR 20) ? ?Previous antihypertensive: ?Amlodipine - ankle swelling ?Spironolactone - unsure why stopped  ?Hydralazine - swelling with '100mg'$ , okay with '50mg'$  ?Clonidine - worked well, stopped for low BP and bradycardia after Metoprolol ? ?EKGs/Labs/Other Studies Reviewed:  ? ?The following studies were reviewed today: ?Monitor 09/2021: ?Patch Wear Time:  12 days and 16 hours ?  ?Predominant underlying rhythm was sinus rhythm ?Less than 1% ventricular and supraventricular ectopy ?20 SVT runs, longest 17 beats, fastest 6 beats at 176 bpm ?No triggered events noted ?  ?Bilateral Renal Artery Doppler 09/08/2021: ?Summary:  ?Largest Aortic Diameter: 2.1 cm  ?   ?Renal:  ?   ?Right: Abnormal size for the right kidney. Normal cortical thickness  ?       of right kidney. No evidence of right renal artery stenosis.  ?       RRV flow present.  ?Left:  Cyst(s) noted. LRV flow present. No evidence of left renal  ?       artery stenosis. Abnormal cortical thickness of the left  ?       kidney. Abnormal size for the left kidney.  ?Mesenteric:  ?Normal Celiac artery , Superior Mesenteric artery and Inferior Mesenteric  ?artery findings.  ?  ?Echo 05/14/19: ? 1. The left ventricle has normal systolic function with an ejection  ?fraction of 60-65%. The cavity size was normal. There is mildly increased  ?left ventricular wall thickness. Left ventricular diastolic Doppler  ?parameters are consistent with  ?pseudonormalization.  ? 2. The right ventricle has normal systolic function. The cavity was   ?normal. There is no increase in right ventricular wall thickness. Right  ?ventricular systolic pressure is mildly elevated with an estimated  ?pressure of 41.7 mmHg.  ? 3. Left atrial size was moderately dilated.  ? 4. Incompletely evaluated hypoechoic structure noted in the liver, most  ?likely a cyst.  ? 5. The mitral valve is degenerative. Mild thickening of the mitral valve  ?leaflet. Mitral valve regurgitation is moderate by color flow Doppler.  ? 6. Tricuspid valve regurgitation is moderate.  ? 7. The aortic valve has an indeterminate number of cusps. Mild thickening  ?of the aortic valve. Mild calcification of the aortic valve. Aortic valve  ?regurgitation is mild by color flow Doppler.  ? 8. The aorta is normal in size and structure.  ? ?EKG: No EKG today ? ?Recent Labs: ?08/17/2021: B Natriuretic Peptide 353.9 ?08/18/2021: Magnesium 2.0 ?01/13/2022: ALT 19; TSH 1.522 ?01/17/2022: Hemoglobin 10.3; Platelets 155 ?01/21/2022: BUN 30; Creatinine, Ser 2.27; Potassium 4.1; Sodium 137  ?Recent Lipid Panel ?   ?Component Value Date/Time  ? CHOL 205 (  H) 08/18/2021 0325  ? TRIG 47 08/18/2021 0325  ? HDL 78 08/18/2021 0325  ? CHOLHDL 2.6 08/18/2021 0325  ? VLDL 9 08/18/2021 0325  ? LDLCALC 118 (H) 08/18/2021 0325  ? LDLDIRECT 104.4 11/26/2012 0746  ? ? ?Risk Assessment/Calculations:  ? ?CHA2DS2-VASc Score = 6  ?This indicates a 9.7% annual risk of stroke. ?The patient's score is based upon: ?CHF History: 1 ?HTN History: 1 ?Diabetes History: 0 ?Stroke History: 0 ?Vascular Disease History: 1 ?Age Score: 2 ?Gender Score: 1 ?  ?Home Medications  ? ?Current Meds  ?Medication Sig  ? amiodarone (PACERONE) 200 MG tablet Taking one tablet by mouth daily  ? B Complex Vitamins (VITAMIN B COMPLEX) TABS Take 1 tablet by mouth daily.   ? D 1000 25 MCG (1000 UT) capsule TAKE 1 CAPSULE BY MOUTH AT BEDTIME.  ? ELIQUIS 5 MG TABS tablet TAKE 1 TABLET BY MOUTH TWICE DAILY  ? hydrALAZINE (APRESOLINE) 50 MG tablet Take 1 tablet (50 mg total)  by mouth in the morning and at bedtime.  ? losartan (COZAAR) 100 MG tablet TAKE 1 TABLET BY MOUTH ONCE DAILY  ? LUMIGAN 0.01 % SOLN Place 1 drop into both eyes 2 (two) times daily.  ? metoprolol tartrate (LOPRESSOR) 25 MG tablet TAKE 1/2 TABLET AS NEEDE

## 2022-02-11 ENCOUNTER — Other Ambulatory Visit (HOSPITAL_BASED_OUTPATIENT_CLINIC_OR_DEPARTMENT_OTHER): Payer: Self-pay | Admitting: Cardiovascular Disease

## 2022-02-11 NOTE — Telephone Encounter (Signed)
Prescription refill request for Eliquis received. ?Indication:Afib ?Last office visit:4/23 ?Scr:2.27 ?Age: 82 ?Weight:67.4 kg ? ?Prescription refilled ? ?

## 2022-02-14 ENCOUNTER — Emergency Department (HOSPITAL_COMMUNITY): Payer: Medicare Other

## 2022-02-14 ENCOUNTER — Telehealth (HOSPITAL_BASED_OUTPATIENT_CLINIC_OR_DEPARTMENT_OTHER): Payer: Self-pay | Admitting: Cardiovascular Disease

## 2022-02-14 ENCOUNTER — Encounter (HOSPITAL_COMMUNITY): Payer: Self-pay | Admitting: Emergency Medicine

## 2022-02-14 ENCOUNTER — Emergency Department (HOSPITAL_COMMUNITY)
Admission: EM | Admit: 2022-02-14 | Discharge: 2022-02-14 | Disposition: A | Discharge status: Home / Self Care | Payer: Medicare Other | Attending: Emergency Medicine | Admitting: Emergency Medicine

## 2022-02-14 DIAGNOSIS — N189 Chronic kidney disease, unspecified: Secondary | ICD-10-CM | POA: Insufficient documentation

## 2022-02-14 DIAGNOSIS — I129 Hypertensive chronic kidney disease with stage 1 through stage 4 chronic kidney disease, or unspecified chronic kidney disease: Secondary | ICD-10-CM | POA: Diagnosis not present

## 2022-02-14 DIAGNOSIS — R7989 Other specified abnormal findings of blood chemistry: Secondary | ICD-10-CM | POA: Diagnosis not present

## 2022-02-14 DIAGNOSIS — R0602 Shortness of breath: Secondary | ICD-10-CM | POA: Insufficient documentation

## 2022-02-14 DIAGNOSIS — R609 Edema, unspecified: Secondary | ICD-10-CM

## 2022-02-14 DIAGNOSIS — Z79899 Other long term (current) drug therapy: Secondary | ICD-10-CM | POA: Insufficient documentation

## 2022-02-14 DIAGNOSIS — Z7901 Long term (current) use of anticoagulants: Secondary | ICD-10-CM | POA: Diagnosis not present

## 2022-02-14 DIAGNOSIS — R600 Localized edema: Secondary | ICD-10-CM | POA: Diagnosis not present

## 2022-02-14 DIAGNOSIS — R519 Headache, unspecified: Secondary | ICD-10-CM | POA: Insufficient documentation

## 2022-02-14 DIAGNOSIS — M7989 Other specified soft tissue disorders: Secondary | ICD-10-CM | POA: Diagnosis present

## 2022-02-14 DIAGNOSIS — R011 Cardiac murmur, unspecified: Secondary | ICD-10-CM | POA: Diagnosis not present

## 2022-02-14 LAB — CBC
HCT: 29 % — ABNORMAL LOW (ref 36.0–46.0)
Hemoglobin: 9.6 g/dL — ABNORMAL LOW (ref 12.0–15.0)
MCH: 31.4 pg (ref 26.0–34.0)
MCHC: 33.1 g/dL (ref 30.0–36.0)
MCV: 94.8 fL (ref 80.0–100.0)
Platelets: 170 10*3/uL (ref 150–400)
RBC: 3.06 MIL/uL — ABNORMAL LOW (ref 3.87–5.11)
RDW: 14.4 % (ref 11.5–15.5)
WBC: 5.6 10*3/uL (ref 4.0–10.5)
nRBC: 0 % (ref 0.0–0.2)

## 2022-02-14 LAB — BASIC METABOLIC PANEL
Anion gap: 7 (ref 5–15)
BUN: 28 mg/dL — ABNORMAL HIGH (ref 8–23)
CO2: 24 mmol/L (ref 22–32)
Calcium: 9.6 mg/dL (ref 8.9–10.3)
Chloride: 109 mmol/L (ref 98–111)
Creatinine, Ser: 2.04 mg/dL — ABNORMAL HIGH (ref 0.44–1.00)
GFR, Estimated: 24 mL/min — ABNORMAL LOW (ref >60)
Glucose, Bld: 98 mg/dL (ref 70–99)
Potassium: 3.9 mmol/L (ref 3.5–5.1)
Sodium: 140 mmol/L (ref 135–145)

## 2022-02-14 LAB — TROPONIN I (HIGH SENSITIVITY): Troponin I (High Sensitivity): 6 ng/L (ref <18)

## 2022-02-14 LAB — BRAIN NATRIURETIC PEPTIDE: B Natriuretic Peptide: 244.6 pg/mL — ABNORMAL HIGH (ref 0.0–100.0)

## 2022-02-14 IMAGING — CT CT HEAD W/O CM
4 series · 17 of 47 positions shown, 19 images · non-contrast
Comparison: [DATE]

CLINICAL DATA: Chronic headaches



[Series 2: head wo · axial · 0.39mm/px · z∈[-138,-18]mm · 7 of 31 slices shown, 9 images]
[im 4/31  brain]
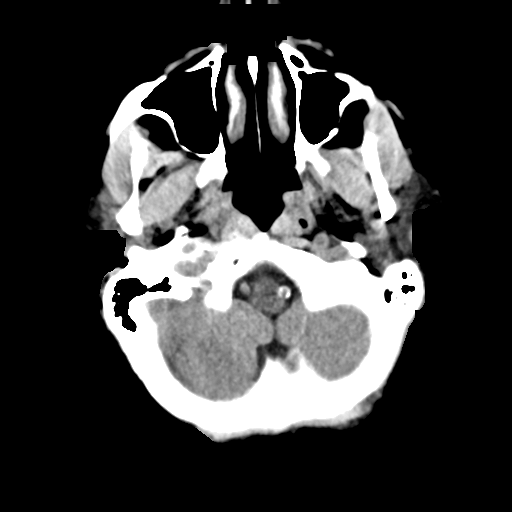
[im 4/31  bone]
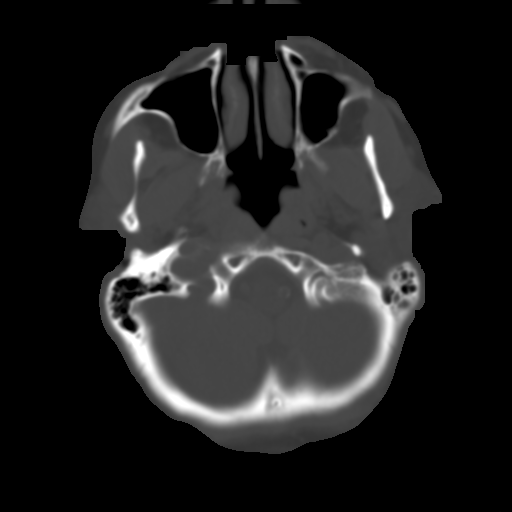
[im 8/31  brain]
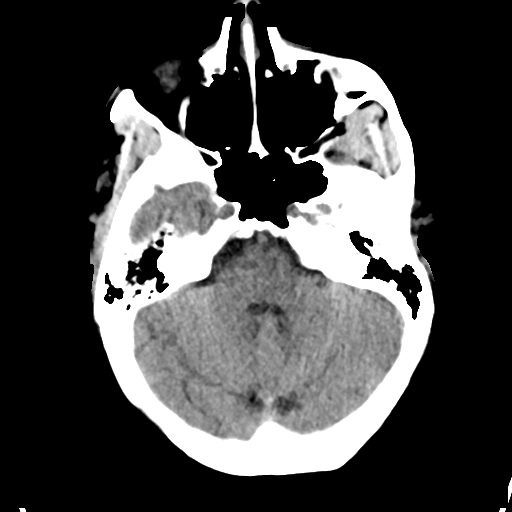
[im 12/31  brain]
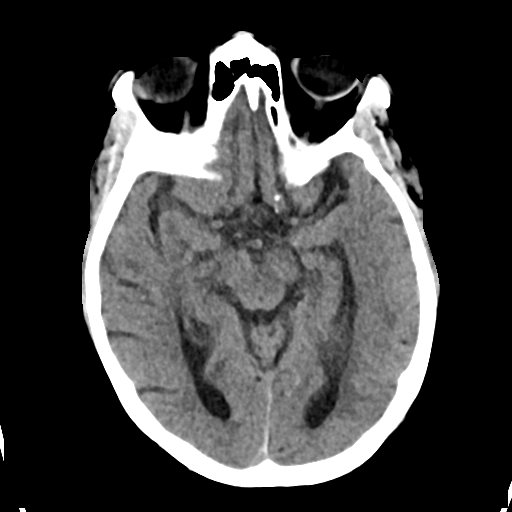
[im 16/31  brain]
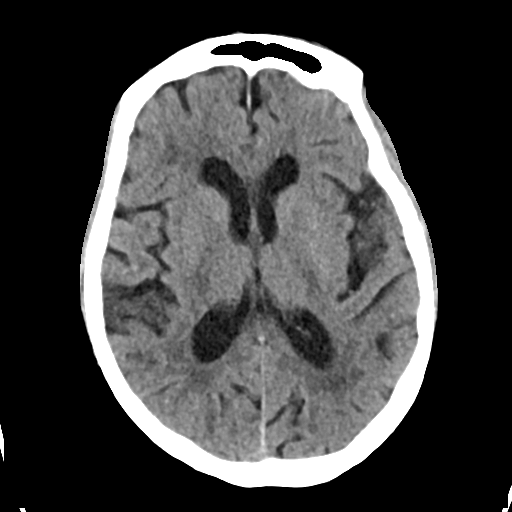
[im 19/31  brain]
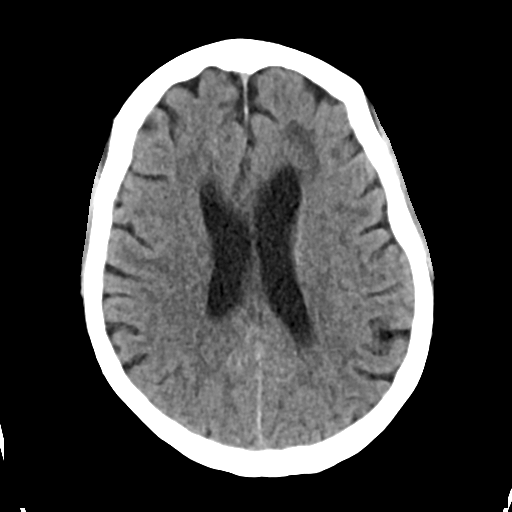
[im 19/31  bone]
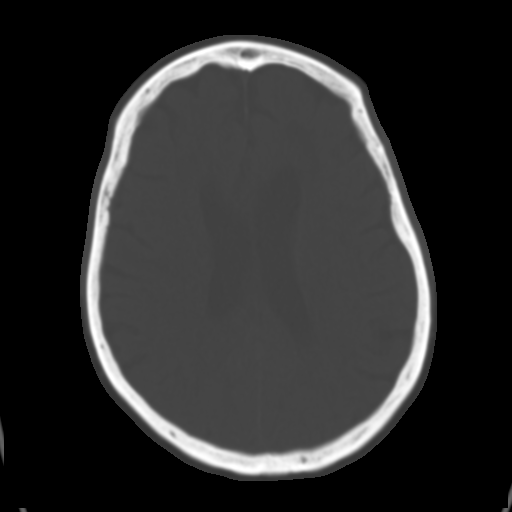
[im 23/31  brain]
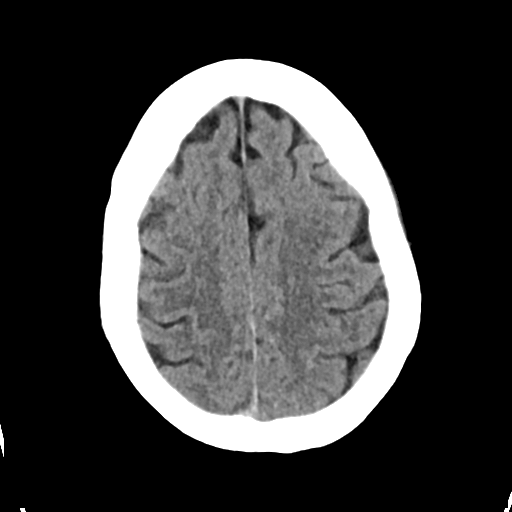
[im 27/31  brain]
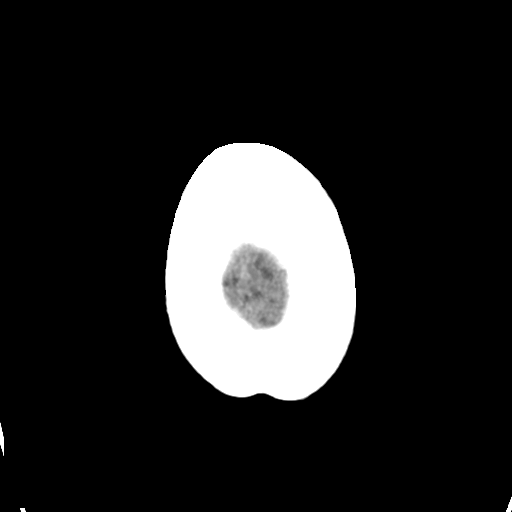

[Series 3: head bone · axial · 0.39mm/px · z∈[-140,-86]mm · 4 of 78 slices shown]
[im 8/78  bone]
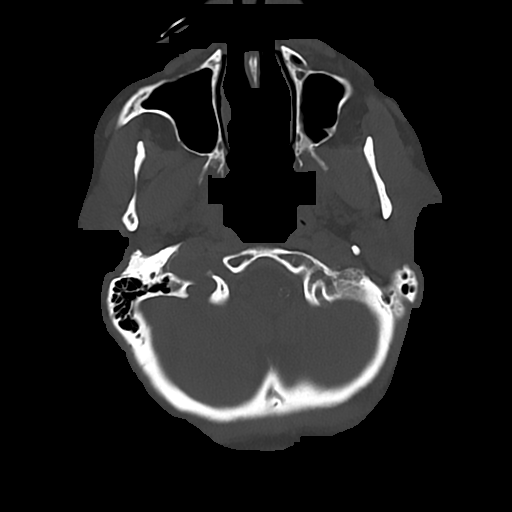
[im 16/78  bone]
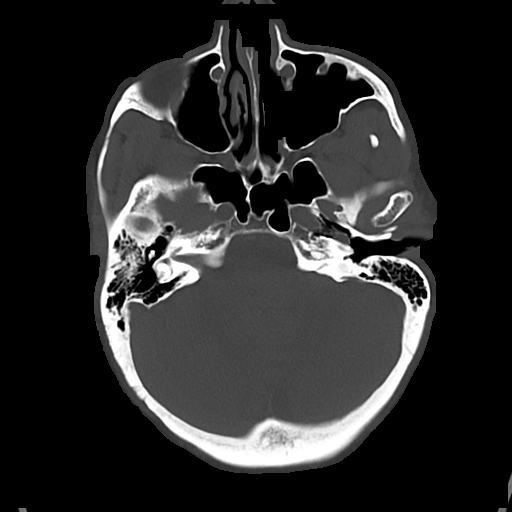
[im 24/78  bone]
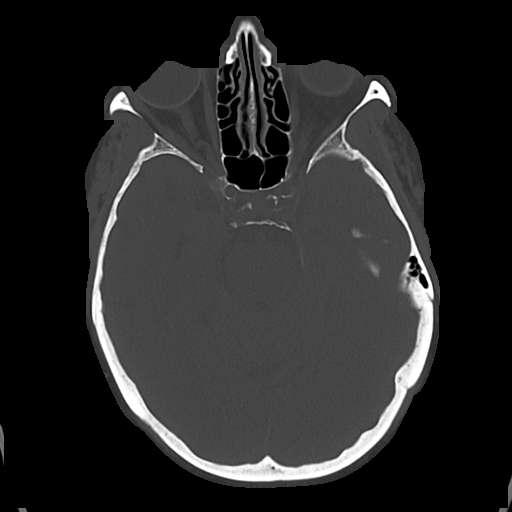
[im 35/78  bone]
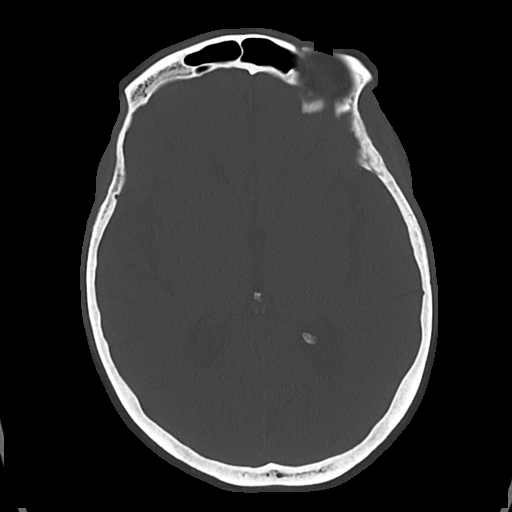

[Series 4: cor soft · coronal · 0.31mm/px · 3 of 68 slices shown]
[im 23/68  brain]
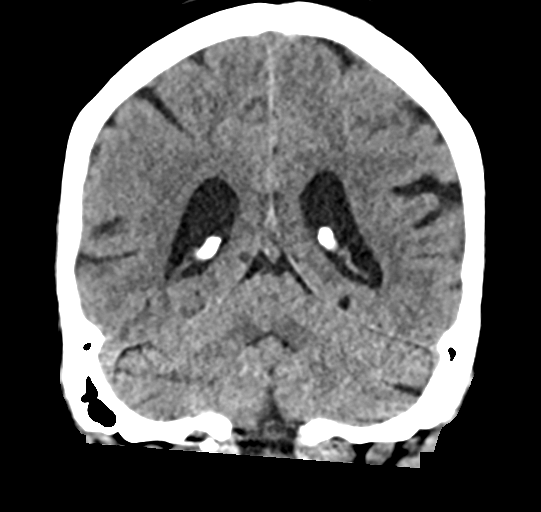
[im 30/68  brain]
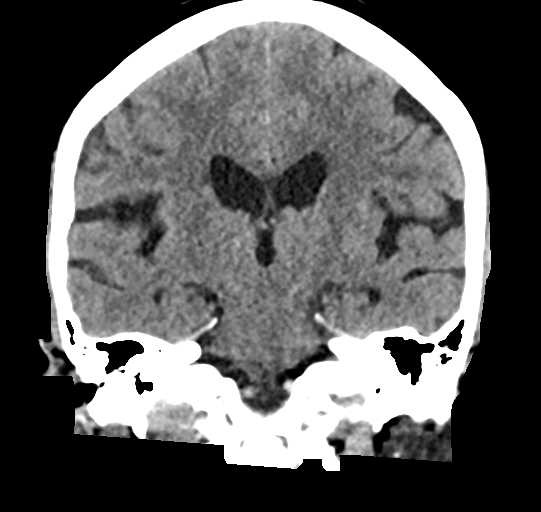
[im 38/68  brain]
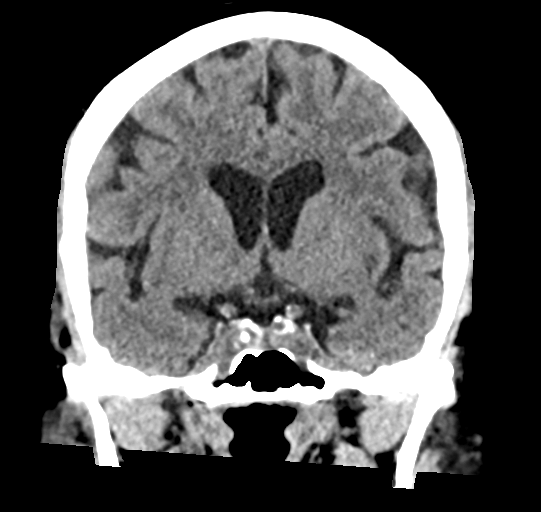

[Series 5: sag soft · sagittal · 0.31mm/px · 3 of 57 slices shown]
[im 19/57  brain]
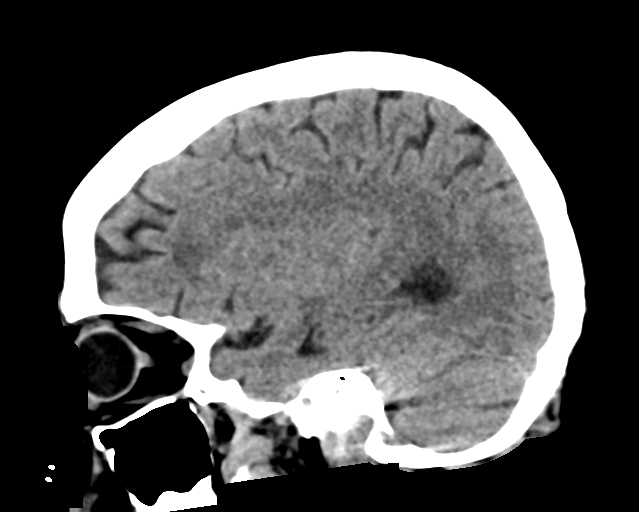
[im 29/57  brain]
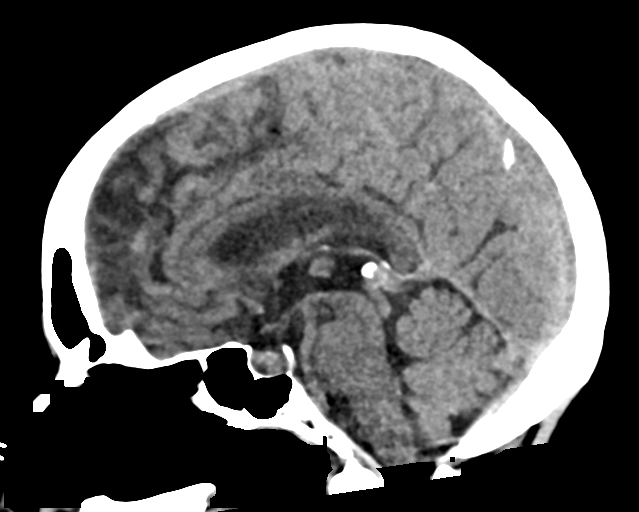
[im 38/57  brain]
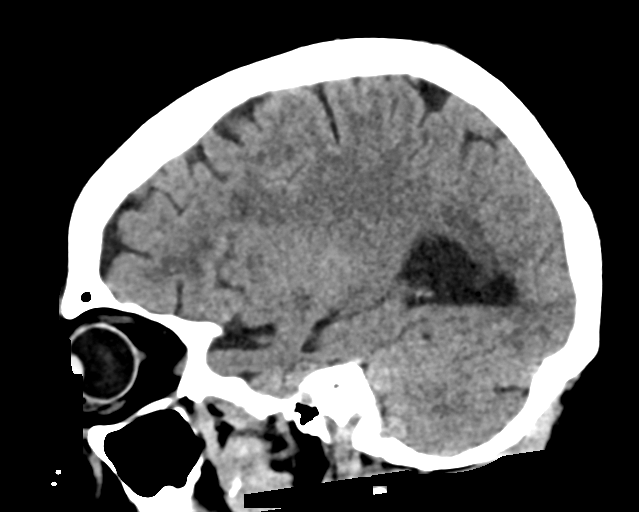

[17 of 47 positions shown; findings below may reference images not displayed]

FINDINGS: Brain: No acute intracranial findings are seen. Ventricles are not
dilated. There is no shift of midline structures. Cortical sulci are
prominent. There is decreased density in the periventricular white
matter.

Vascular: Scattered arterial calcifications are seen.

Skull: Unremarkable.

Sinuses/Orbits: Unremarkable

Other: No significant interval changes are noted.
IMPRESSION: No acute intracranial findings are seen in noncontrast CT brain.
Atrophy. Small-vessel disease.

## 2022-02-14 MED ORDER — FUROSEMIDE 20 MG PO TABS: 20.0000 mg | ORAL_TABLET | Freq: Every day | ORAL | 0 refills | Status: DC

## 2022-02-14 MED ORDER — FUROSEMIDE 10 MG/ML IJ SOLN
20.0000 mg | Freq: Once | INTRAMUSCULAR | Status: AC
  Administered 2022-02-14: 20 mg via INTRAVENOUS
  Filled 2022-02-14: qty 2

## 2022-02-14 MED ORDER — ACETAMINOPHEN 325 MG PO TABS: 650.0000 mg | ORAL_TABLET | Freq: Four times a day (QID) | ORAL | Status: DC | PRN

## 2022-02-14 NOTE — Telephone Encounter (Signed)
Patient is having some swelling in her feet and ankles  ?

## 2022-02-14 NOTE — Telephone Encounter (Signed)
Attempted to call patient, no answer, patient is listed as currently in the ED  ?

## 2022-02-14 NOTE — Telephone Encounter (Signed)
FYI this is [name]'s grandma, looks like she is currently waiting to be seen in ED, I called but didn't get an answer ?

## 2022-02-14 NOTE — ED Provider Triage Note (Signed)
Emergency Medicine Provider Triage Evaluation Note ? ?[name] , a 81 y.o. female  was evaluated in triage.  Pt complains of bilateral ankle swelling for last few days.  Patient with a history of A-fib, chronically anticoagulated on Eliquis.  She does not have a known history of heart failure, does not take any diuretics.  He does endorse some worsening shortness of breath especially with exertion.  She denies any chest pain.  She also reports that ever since 3 months ago when she hit the top of her head on the cabinet she has had persistent throbbing headaches which seem to not be going away regardless of what she is doing.  She denies any numbness, tingling, vision changes associated with the headaches.  She did have a unremarkable CT scan at time of initial evaluation. ? ?Review of Systems  ?Positive: Shortness of breath, leg swelling, headache ?Negative: vision changes, weakness, numbness, tingling, chest pain ? ?Physical Exam  ?BP (!) 168/76 (BP Location: Right Arm)   Pulse 60   Temp 98.2 ?F (36.8 ?C) (Oral)   Resp 20   Ht '5\' 3"'$  (1.6 m)   Wt 67 kg   SpO2 100%   BMI 26.17 kg/m?  ?Gen:   Awake, no distress   ?Resp:  Normal effort  ?MSK:   Moves extremities without difficulty  ?Other:  1-2+ edema bilaterally ? ?Medical Decision Making  ?Medically screening exam initiated at 10:05 AM.  Appropriate orders placed.  [name] was informed that the remainder of the evaluation will be completed by another provider, this initial triage assessment does not replace that evaluation, and the importance of remaining in the ED until their evaluation is complete. ? ?Workup initiated ?  ?Anselmo Pickler, PA-C ?02/14/22 1007 ? ?

## 2022-02-14 NOTE — Discharge Instructions (Addendum)
Your EKG did not reveal evidence of a heart attack, your chest x-ray revealed no evidence of pneumonia or fluid on your lungs.  You have slight swelling in your legs bilaterally for which we will place you on a short course of Lasix 20 mg daily and have you follow-up with your cardiologist and primary doctor.  Regarding your daily headaches, symptoms could be due to concussion.  Your CT head today revealed the following findings: ?IMPRESSION:  ?No acute intracranial findings are seen in noncontrast CT brain.  ?Atrophy. Small-vessel disease.  ?   ? ?Given your chronic headaches, we will have you follow-up with neurology in clinic to evaluate further.  Recommend Tylenol for headaches. ?

## 2022-02-14 NOTE — ED Provider Notes (Signed)
?[facility] ?Provider Note ? ? ?CSN: [id number] ?Arrival date & time: 02/14/22  0934 ? ?  ? ?History ? ?Chief Complaint  ?Patient presents with  ? Leg Swelling  ? ? ?[name] is a 81 y.o. female. ? ?HPI ? ?81 year old female with medical history significant for HTN, HLD, mitral regurgitation, tricuspid regurgitation, paroxysmal atrial fibrillation on Eliquis status post ablation, CKD stage III who presents to the emergency department with multiple complaints.  The patient states that she fell back in February and was seen in the emergency department at [facility] on 11/28/2021 during which time she had CT imaging of the head which was negative.  She followed up with her PCP on 12/21/2021 for persistent symptoms of headache.  At that time she was diagnosed with possible concussion.  She has not had repeat head imaging since the fall.  She has had no further falls. ? ?Additionally, the patient states that she has had a mild increase in dyspnea on exertion and shortness of breath.  She states that she has to prop herself up with pillows at night to sleep.  Over the past week she has noticed slight increasing bilateral lower extremity edema.  She denies any chest pain.  She is not currently on a diuretic.  She denies any fevers or chills or cough.  Her last echocardiogram in March 2023 revealed an ejection fraction of 60 to 65%, moderate mitral regurgitation, mild to moderate aortic insufficiency.  She had previously been on amlodipine but stopped due to ankle swelling. ? ?Home Medications ?Prior to Admission medications   ?Medication Sig Start Date End Date Taking? Authorizing Provider  ?furosemide (LASIX) 20 MG tablet Take 1 tablet (20 mg total) by mouth daily for 5 days. 02/14/22 02/19/22 Yes Regan Lemming, MD  ?amiodarone (PACERONE) 200 MG tablet Taking one tablet by mouth daily 01/13/22   Sherran Needs, NP  ?B Complex Vitamins (VITAMIN B COMPLEX) TABS  Take 1 tablet by mouth daily.     [provider]  ?chlorthalidone (HYGROTON) 25 MG tablet Take 12.5 mg by mouth as directed. ?Patient not taking: Reported on 02/03/2022    [provider]  ?D 1000 25 MCG (1000 UT) capsule TAKE 1 CAPSULE BY MOUTH AT BEDTIME. 10/01/21   [name], MD  ?Arne [place] 5 MG TABS tablet TAKE 1 TABLET BY MOUTH TWICE A DAY 02/11/22   [name], MD  ?hydrALAZINE (APRESOLINE) 50 MG tablet Take 1 tablet (50 mg total) by mouth in the morning and at bedtime. 12/29/21 06/27/22  Loel Dubonnet, NP  ?losartan (COZAAR) 100 MG tablet TAKE 1 TABLET BY MOUTH ONCE DAILY 01/31/22   [name], MD  ?LUMIGAN 0.01 % SOLN Place 1 drop into both eyes 2 (two) times daily. 05/28/20   [provider]  ?metoprolol tartrate (LOPRESSOR) 25 MG tablet TAKE 1/2 TABLET AS NEEDED FOR AFIB 08/19/21   [name], MD  ?pantoprazole (PROTONIX) 40 MG tablet Take 1 tablet (40 mg total) by mouth daily. 07/26/21   [name], MD  ?rosuvastatin (CRESTOR) 10 MG tablet TAKE 1 TABLET BY MOUTH ONCE DAILY 01/31/22   [name], MD  ?   ? ?Allergies    ?Amlodipine, Celecoxib, and Tramadol   ? ?Review of Systems   ?Review of Systems  ?Respiratory:  Positive for shortness of breath.   ?Cardiovascular:  Positive for leg swelling.  ?All other systems reviewed and are negative. ? ?Physical Exam ?Updated Vital Signs ?  BP (!) 150/63 (BP Location: Right Arm)   Pulse (!) 47   Temp (!) 97.5 ?F (36.4 ?C) (Oral)   Resp 13   Ht '5\' 3"'$  (1.6 m)   Wt 67 kg   SpO2 98%   BMI 26.17 kg/m?  ?Physical Exam ?Vitals and nursing note reviewed.  ?Constitutional:   ?   General: She is not in acute distress. ?   Appearance: She is well-developed.  ?HENT:  ?   Head: Normocephalic and atraumatic.  ?Eyes:  ?   Conjunctiva/sclera: Conjunctivae normal.  ?Cardiovascular:  ?   Rate and Rhythm: Normal rate and regular rhythm.  ?   Heart sounds: Murmur heard.  ?Pulmonary:  ?   Effort: Pulmonary effort is  normal. No respiratory distress.  ?   Breath sounds: Normal breath sounds.  ?Abdominal:  ?   Palpations: Abdomen is soft.  ?   Tenderness: There is no abdominal tenderness.  ?Musculoskeletal:     ?   General: No swelling.  ?   Cervical back: Neck supple.  ?   Right lower leg: Edema present.  ?   Left lower leg: Edema present.  ?   Comments: Trace bilateral pitting lower extremity edema  ?Skin: ?   General: Skin is warm and dry.  ?   Capillary Refill: Capillary refill takes less than 2 seconds.  ?Neurological:  ?   General: No focal deficit present.  ?   Mental Status: She is alert and oriented to person, place, and time. Mental status is at baseline.  ?   Comments: MENTAL STATUS EXAM:    ?Orientation: Alert and oriented to person, place and time.  ?Memory: Cooperative, follows commands well.  ?Language: Speech is clear and language is normal.  ? ?CRANIAL NERVES:    ?CN 2 (Optic): Visual fields intact to confrontation.  ?CN 3,4,6 (EOM): Pupils equal and reactive to light. Full extraocular eye movement without nystagmus.  ?CN 5 (Trigeminal): Facial sensation is normal, no weakness of masticatory muscles.  ?CN 7 (Facial): No facial weakness or asymmetry.  ?CN 8 (Auditory): Auditory acuity grossly normal.  ?CN 9,10 (Glossophar): The uvula is midline, the palate elevates symmetrically.  ?CN 11 (spinal access): Normal sternocleidomastoid and trapezius strength.  ?CN 12 (Hypoglossal): The tongue is midline. No atrophy or fasciculations..  ? ?MOTOR:  Muscle Strength: 5/5RUE, 5/5LUE, 5/5RLE, 5/5LLE.  ? ?COORDINATION:   Intact finger-to-nose, no tremor.  ? ?SENSATION:   Intact to light touch all four extremities. ? ? ?  ?Psychiatric:     ?   Mood and Affect: Mood normal.  ? ? ?ED Results / Procedures / Treatments   ?Labs ?(all labs ordered are listed, but only abnormal results are displayed) ?Labs Reviewed  ?CBC - Abnormal; Notable for the following components:  ?    Result Value  ? RBC 3.06 (*)   ? Hemoglobin 9.6 (*)   ? HCT  29.0 (*)   ? All other components within normal limits  ?BASIC METABOLIC PANEL - Abnormal; Notable for the following components:  ? BUN 28 (*)   ? Creatinine, Ser 2.04 (*)   ? GFR, Estimated 24 (*)   ? All other components within normal limits  ?BRAIN NATRIURETIC PEPTIDE - Abnormal; Notable for the following components:  ? B Natriuretic Peptide 244.6 (*)   ? All other components within normal limits  ?TROPONIN I (HIGH SENSITIVITY)  ? ? ?EKG ?EKG Interpretation ? ?Date/Time:  Monday Feb 14 2022 12:16:59 EDT ?Ventricular Rate:  53 ?PR Interval:  159 ?QRS Duration: 96 ?QT Interval:  494 ?QTC Calculation: 464 ?R Axis:   58 ?Text Interpretation: Sinus rhythm Borderline T abnormalities, anterior leads Confirmed by Regan Lemming ([id number]) on 02/14/2022 12:48:22 PM ? ?Radiology ?DG Chest 2 View ? ?Result Date: 02/14/2022 ?CLINICAL DATA:  Shortness of breath. EXAM: CHEST - 2 VIEW COMPARISON:  08/17/2021 FINDINGS: Mild cardiac enlargement, stable. No pleural effusion or edema. No airspace opacities identified. Stable linear scar in the left midlung. IMPRESSION: No acute cardiopulmonary abnormalities. Electronically Signed   By: Kerby Moors M.D.   On: 02/14/2022 10:34  ? ?CT Head Wo Contrast ? ?Result Date: 02/14/2022 ?CLINICAL DATA:  Chronic headaches EXAM: CT HEAD WITHOUT CONTRAST TECHNIQUE: Contiguous axial images were obtained from the base of the skull through the vertex without intravenous contrast. RADIATION DOSE REDUCTION: This exam was performed according to the departmental dose-optimization program which includes automated exposure control, adjustment of the mA and/or kV according to patient size and/or use of iterative reconstruction technique. COMPARISON:  11/18/2021 FINDINGS: Brain: No acute intracranial findings are seen. Ventricles are not dilated. There is no shift of midline structures. Cortical sulci are prominent. There is decreased density in the periventricular white matter. Vascular: Scattered arterial  calcifications are seen. Skull: Unremarkable. Sinuses/Orbits: Unremarkable Other: No significant interval changes are noted. IMPRESSION: No acute intracranial findings are seen in noncontrast CT brain. Atrophy. Sm

## 2022-02-14 NOTE — Telephone Encounter (Signed)
Discussed with Zigmund Daniel, patient presented today for follow up visit but had mistaken her June 8 appointment for today ?Zigmund Daniel explained wrong day and scheduled patient an appointment for Thursday, daughter verbalized consent to this appointment  ?Provider not scheduled to arrive in office until 10:00 am.  ?

## 2022-02-14 NOTE — ED Triage Notes (Signed)
Pt endorses bilateral lower leg/ankle swelling. Also has has a headache and trouble sleeping for months.  ?

## 2022-02-16 ENCOUNTER — Encounter: Payer: Self-pay | Admitting: Oncology

## 2022-02-17 ENCOUNTER — Ambulatory Visit (HOSPITAL_BASED_OUTPATIENT_CLINIC_OR_DEPARTMENT_OTHER): Payer: Medicare Other | Admitting: Cardiovascular Disease

## 2022-02-17 ENCOUNTER — Encounter (HOSPITAL_BASED_OUTPATIENT_CLINIC_OR_DEPARTMENT_OTHER): Payer: Self-pay | Admitting: Cardiovascular Disease

## 2022-02-17 VITALS — BP 160/64 | HR 60 | Ht 63.0 in | Wt 145.2 lb

## 2022-02-17 DIAGNOSIS — Z5181 Encounter for therapeutic drug level monitoring: Secondary | ICD-10-CM | POA: Diagnosis not present

## 2022-02-17 DIAGNOSIS — I10 Essential (primary) hypertension: Secondary | ICD-10-CM

## 2022-02-17 MED ORDER — HYDRALAZINE HCL 50 MG PO TABS: 50.0000 mg | ORAL_TABLET | Freq: Three times a day (TID) | ORAL | 3 refills | Status: DC

## 2022-02-17 MED ORDER — FUROSEMIDE 40 MG PO TABS: 40.0000 mg | ORAL_TABLET | Freq: Every day | ORAL | 3 refills | Status: DC

## 2022-02-17 NOTE — Patient Instructions (Addendum)
Medication Instructions:  ?INCREASE YOUR FUROSEMIDE TO 40 MG DAILY  ? ?TAKE HYDRALAZINE EVERY 8 HOURS  ? ?Labwork: ?BMET IN 1 WEEK  ? ?Testing/Procedures: ?NONE ? ?Follow-Up: ?KEEP YOUR June APPOINTMENT AND WILL CHANGE IT TO [place] VIDEO VISIT  ? ?

## 2022-02-17 NOTE — Telephone Encounter (Signed)
Patient seen by Dr [place] today  

## 2022-02-17 NOTE — Progress Notes (Signed)
? ?Cardiology Office Note:   ? ?Date:  02/17/2022  ? ?ID:  [name], DOB 18-Nov-1940, MRN [id number] ? ?PCP:  [name], MD  ?Cardiologist:  [name], MD  ? ?Referring MD: [name], MD  ? ?CC: Hypertension ? ?History of Present Illness:   ? ?[name] is a 81 y.o. female with a hx of persistent atrial fibrillation status post ablation, bradycardia, CKD 2, mitral regurgitation, aortic regurgitation, hypertension, and hyperlipidemia here for follow up. She had an echo 05/14/2019 that revealed LVEF 60 to 65% with grade 2 diastolic dysfunction and mildly elevated pulmonary pressures.  She was initially seen 12/2020 to establish care in the hypertension clinic.  She was first diagnosed with hypertension many decades ago.  In the past it was well-controlled.  However in the last year she has struggled.  Lately her blood pressure has been in the 160s at home.  She last saw [name] on 11/22/2019.  Prior to that losartan was increased to 50 mg.  Her blood pressure remained above goal since making that change.  [name] increased losartan to '100mg'$ .  She followed up with [name], NP, and hydralazine '25mg'$  bid was added on 2/26.  At her first visit hydralazine was increased.  She saw [name], NP on 12/2019 an dit was increased to '75mg'$ .  She started to work out at the [employer].  She was seen in the ED with palpitations 03/2020.  She was in sinus rhythm at the time.  This has resolved.  ? ?It was noted that she had inadvertently stopped taking her hydrochlorothiazide. She this was resumed when she saw her pharmacist on 03/2020.  Hydralazine was reduced back to 50 mg twice daily.  HCTZ was changed to chlorthalidone. Her lipids were poorly controlled so she was started to rosuvastatin. 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  At her last appointment she reported palpitations but was noted to be in sinus rhythm at that time.  Her renal function was worsening so chlorthalidone was  reduced and she was started on clonidine.  Renal artery dopplers were normal 08/2021. ? ?08/2021 she was seen in the hospital.  She reported shortness of breath and chest pain.  She also had increasing lower extremity edema.  She was noted to be in atrial fibrillation with RVR in the 90s to 150s.  She was given IV diltiazem and IV metoprolol.  High-sensitivity troponin was minimally elevated 22 and flat.  It was thought to be demand ischemia in the setting of A. fib with RVR.  With the initiation of IV diltiazem her heart rate quickly improved.  She was started on metoprolol 12.5 mg twice daily.  Her home hydralazine was held and she was asked to follow-up closely with cardiology. When she followed up in clinic she was bradycardic.  Clonidine and metoprolol were both discontinued she was instructed to take the metoprolol only as needed.  She was continued on losartan and chlorthalidone.  She followed up with EP and remained in sinus rhythm.  Given her chronic kidney disease she was not a candidate for dofetilide.  They discussed ablation versus amiodarone.  She was started on a 2-week monitor to better assess her heart rate and rhythm. She had 20 runs of SVT up to 17 beats. Her average heart rate was 53 bpm. She did have some overnight bradycardia to the mid 30s.  ? ?At her last appointment she was feeling better,  with well controlled blood pressure and consistent heart rates in the 40s. She saw [name] 10/12/21 and was doing well. It was noted she had continued episodes of Afib but had been in sinus rhythm for most of the interim. She was started on 200 mg amiodarone twice daily for 1 month followed by 200 mg daily. On 11/18/2021 she presented to the ED 1 week after a head injury due to persistent pain and raised lesion. She had accidentally struck her occiput on the underside of a cabinet. Workup revealed a small, non-bleeding occipital hematoma suitable for outpatient management.  ? ?She is accompanied by a  family member. Today, she states she is feeling good overall. On average she believes her blood pressure is in the 160s at home. She usually checks it often, but forgot to bring her log with her today. They agree that a bradycardic heart rate as shown on her EKG is fairly typical for her. She reports that amiodarone has been helping her significantly. Her arrhythmic episodes have been much more controlled. Lately she has been exercising only a little.  She would like to work up to returning to the gym 3 times a week. However, she was limited by a busy schedule and her recent heart issues. Also, she states her diet is "not good." She denies any chest pain, shortness of breath, or peripheral edema. No lightheadedness, headaches, syncope, orthopnea, or PND ? ?She saw her PCP and chlorthalidone was discontinued 2/2 AKI.  After that she started having lower extremity edema and her blood pressures increased.  She was seen in the ED 02/14/2022.  She also had mild increased exertional dyspnea.  BNP was 244.  Cardiac enzymes were negative.  She was given Lasix 20 mg to be taken for 5 days.  She has not had any improvement in her edema.  At home her blood pressures have ranged 110s to 150s over 60s to 70s.  She has follow-up scheduled with nephrology but has not seen them yet.  She has no chest pain and her breathing is stable. ? ?Previous antihypertensives: ?Amlodipine- ankle swelling ?Spironolactone- unsure why it was stopped ?Hydralazine- swelling with '100mg'$ , OK with '50mg'$  ?Clonidine- worked well.  Stopping for low BP and bradycardia after adding metoprolol ? ?Past Medical History:  ?Diagnosis Date  ? Anemia   ? 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 Qualifier: Diagnosis of  By: [name], [name]    ? Coronary artery disease, non-occlusive   ? a. cath 2/09: no CAD, EF 70%  ? GERD (gastroesophageal reflux disease) 07/26/2021  ? HYPERLIPIDEMIA   ? HYPERTENSION   ? Mild Aortic insufficiency   ? Moderate mitral regurgitation   ?  Moderate tricuspid regurgitation   ? OSTEOPENIA   ? PAF (paroxysmal atrial fibrillation) ([place])   ? a. s/p ablation 2013; b. CHADS2VASc -> 4 (HTN, age x 2, female)-->Eliquis.  ? PAT (paroxysmal atrial tachycardia) ([place])   ? a. 04/2019 Zio: Occas PACs and rare PVCs. 21 atrial runs - longest 20 beats, max rate 169.  ? Pneumonia 2009  ? RA (rheumatoid arthritis) ([place])   ? Sinus Bradycardia   ? a. asymptomatic but prevents use of AVN blocking agents; b. 04/2019 Zio: Avg HR 61 (37-109).  ? 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 Valvular heart disease   ? a. 05/2019 Echo: EF 60-65%. DD. RVSP 41.25mHg. Mod dil LA. Mod MR/TR. Mild AI.  ? ? ?Past Surgical History:  ?Procedure Laterality Date  ? ABLATION OF DYSRHYTHMIC FOCUS    ?  CARDIAC CATHETERIZATION    ? COLONOSCOPY  04/18/08  ? Partial hysterectomy--1979    ? Thoracentesis   12/20/07    ? ? ?Current Medications: ?Current Meds  ?Medication Sig  ? amiodarone (PACERONE) 200 MG tablet Taking one tablet by mouth daily  ? B Complex Vitamins (VITAMIN B COMPLEX) TABS Take 1 tablet by mouth daily.   ? D 1000 25 MCG (1000 UT) capsule TAKE 1 CAPSULE BY MOUTH AT BEDTIME.  ? ELIQUIS 5 MG TABS tablet TAKE 1 TABLET BY MOUTH TWICE A DAY  ? losartan (COZAAR) 100 MG tablet TAKE 1 TABLET BY MOUTH ONCE DAILY  ? LUMIGAN 0.01 % SOLN Place 1 drop into both eyes 2 (two) times daily.  ? metoprolol tartrate (LOPRESSOR) 25 MG tablet TAKE 1/2 TABLET AS NEEDED FOR AFIB  ? pantoprazole (PROTONIX) 40 MG tablet Take 1 tablet (40 mg total) by mouth daily.  ? rosuvastatin (CRESTOR) 10 MG tablet TAKE 1 TABLET BY MOUTH ONCE DAILY  ? [DISCONTINUED] furosemide (LASIX) 20 MG tablet Take 1 tablet (20 mg total) by mouth daily for 5 days.  ? [DISCONTINUED] hydrALAZINE (APRESOLINE) 50 MG tablet Take 1 tablet (50 mg total) by mouth in the morning and at bedtime.  ?  ? ?Allergies:   Amlodipine, Celecoxib, and Tramadol  ? ?Social History  ? ?Socioeconomic History  ? Marital status: Married  ?  Spouse name: Not on file  ? Number  of children: Not on file  ? Years of education: Not on file  ? Highest education level: Not on file  ?Occupational History  ? Not on file  ?Tobacco Use  ? Smoking status: Never  ? Smokeless tobacco: Never

## 2022-02-22 ENCOUNTER — Ambulatory Visit (INDEPENDENT_AMBULATORY_CARE_PROVIDER_SITE_OTHER): Payer: Medicare Other | Admitting: Family Medicine

## 2022-02-22 ENCOUNTER — Encounter: Payer: Self-pay | Admitting: Family Medicine

## 2022-02-22 VITALS — BP 110/60 | HR 62 | Temp 97.9°F | Ht 63.0 in | Wt 142.0 lb

## 2022-02-22 DIAGNOSIS — I4891 Unspecified atrial fibrillation: Secondary | ICD-10-CM | POA: Diagnosis not present

## 2022-02-22 DIAGNOSIS — R110 Nausea: Secondary | ICD-10-CM

## 2022-02-22 DIAGNOSIS — I10 Essential (primary) hypertension: Secondary | ICD-10-CM

## 2022-02-22 DIAGNOSIS — N189 Chronic kidney disease, unspecified: Secondary | ICD-10-CM | POA: Diagnosis not present

## 2022-02-22 DIAGNOSIS — I5033 Acute on chronic diastolic (congestive) heart failure: Secondary | ICD-10-CM | POA: Diagnosis not present

## 2022-02-22 DIAGNOSIS — N179 Acute kidney failure, unspecified: Secondary | ICD-10-CM | POA: Diagnosis not present

## 2022-02-22 NOTE — Progress Notes (Signed)
Patient ID: [name], female    DOB: November 01, 1940, 81 y.o.   MRN: [id number]  This visit was conducted in person.  BP 110/60   Pulse 62   Temp 97.9 F (36.6 C) (Oral)   Ht '5\' 3"'$  (1.6 m)   Wt 142 lb (64.4 kg)   SpO2 97%   BMI 25.15 kg/m    CC: Chief Complaint  Patient presents with   Abdominal Pain   Diarrhea   Nausea    Subjective:   HPI: [name] is a 81 y.o. female  with history of HTN, HLD, mitral regurgitation, tricuspid regurgitation, paroxysmal atrial fibrillation on Eliquis status post ablation, CKD stage III presenting on 02/22/2022 for Abdominal Pain, Diarrhea, and Nausea  Seen in ED on 02/14/2022 for   multiple issues including persistent headache, peripheral edema.  CBC without a leukocytosis, WC 5.6, hemoglobin 9.6, BMP with a stable creatinine at 2.04, initial troponin 6, BNP mild/moderately nonspecifically elevated to 245, downtrending from 354 6 months ago EKG revealed mild sinus bradycardia, ventricular rate 5 5, initial QT interval read as 640 but appears to be normal on repeat evaluation.,  Some T wave flattening anteriorly, repeat QTc 464, no significant ST segment changes  Echocardiogram in March 2023 revealed an ejection fraction of 60 to 65%, moderate mitral regurgitation, mild to moderate aortic insufficiency.     She was given 5 days course of furosemide for mild fluid overload and told to follow up with cardiology. Referral place to neurology for chronic headaches.  Since then she had follow up with Cardiology on 02/17/2022 Dr Oval Linsey. Noted reviewed. Felt peripheral edema most likely from venous insufficiency and some diastolic HF. BP was poorly controlled ... increase hydralazine q 8 hours  Recommended continuing lasix 40 mg daily    Today she reports improved blood pressure control. Headache are improved. Still some soreness on scalp.  She reports 4 days ago she was having abdominal pain and diarrhea, now just decreased appetite  and nausea. She is drinking water regularly. She has stopped drinking ensure.    BP Readings from Last 3 Encounters:  02/22/22 110/60  02/17/22 (!) 160/64  02/14/22 (!) 150/63   Wt Readings from Last 3 Encounters:  02/22/22 142 lb (64.4 kg)  02/17/22 145 lb 3.2 oz (65.9 kg)  02/14/22 147 lb 11.3 oz (67 kg)        Relevant past medical, surgical, family and social history reviewed and updated as indicated. Interim medical history since our last visit reviewed. Allergies and medications reviewed and updated. Outpatient Medications Prior to Visit  Medication Sig Dispense Refill   amiodarone (PACERONE) 200 MG tablet Taking one tablet by mouth daily     B Complex Vitamins (VITAMIN B COMPLEX) TABS Take 1 tablet by mouth daily.      D 1000 25 MCG (1000 UT) capsule TAKE 1 CAPSULE BY MOUTH AT BEDTIME. 30 capsule 3   ELIQUIS 5 MG TABS tablet TAKE 1 TABLET BY MOUTH TWICE A DAY 180 tablet 1   furosemide (LASIX) 40 MG tablet Take 1 tablet (40 mg total) by mouth daily. 90 tablet 3   hydrALAZINE (APRESOLINE) 50 MG tablet Take 1 tablet (50 mg total) by mouth every 8 (eight) hours. 270 tablet 3   losartan (COZAAR) 100 MG tablet TAKE 1 TABLET BY MOUTH ONCE DAILY 90 tablet 2   LUMIGAN 0.01 % SOLN Place 1 drop into both eyes 2 (two) times daily.     pantoprazole (PROTONIX)  40 MG tablet Take 1 tablet (40 mg total) by mouth daily. 30 tablet 11   rosuvastatin (CRESTOR) 10 MG tablet TAKE 1 TABLET BY MOUTH ONCE DAILY 90 tablet 2   metoprolol tartrate (LOPRESSOR) 25 MG tablet TAKE 1/2 TABLET AS NEEDED FOR AFIB (Patient not taking: Reported on 02/22/2022) 30 tablet 1   No facility-administered medications prior to visit.     Per HPI unless specifically indicated in ROS section below Review of Systems  Constitutional:  Negative for fatigue and fever.  HENT:  Negative for congestion.   Eyes:  Negative for pain.  Respiratory:  Negative for cough and shortness of breath.   Cardiovascular:  Negative for  chest pain, palpitations and leg swelling.  Gastrointestinal:  Negative for abdominal pain.  Genitourinary:  Negative for dysuria and vaginal bleeding.  Musculoskeletal:  Negative for back pain.  Neurological:  Negative for syncope, light-headedness and headaches.  Psychiatric/Behavioral:  Negative for dysphoric mood.    Objective:  BP 110/60   Pulse 62   Temp 97.9 F (36.6 C) (Oral)   Ht '5\' 3"'$  (1.6 m)   Wt 142 lb (64.4 kg)   SpO2 97%   BMI 25.15 kg/m   Wt Readings from Last 3 Encounters:  02/22/22 142 lb (64.4 kg)  02/17/22 145 lb 3.2 oz (65.9 kg)  02/14/22 147 lb 11.3 oz (67 kg)      Physical Exam Constitutional:      General: She is not in acute distress.    Appearance: Normal appearance. She is well-developed. She is not ill-appearing or toxic-appearing.  HENT:     Head: Normocephalic.     Right Ear: Hearing, tympanic membrane, ear canal and external ear normal. Tympanic membrane is not erythematous, retracted or bulging.     Left Ear: Hearing, tympanic membrane, ear canal and external ear normal. Tympanic membrane is not erythematous, retracted or bulging.     Nose: No mucosal edema or rhinorrhea.     Right Sinus: No maxillary sinus tenderness or frontal sinus tenderness.     Left Sinus: No maxillary sinus tenderness or frontal sinus tenderness.     Mouth/Throat:     Pharynx: Uvula midline.  Eyes:     General: Lids are normal. Lids are everted, no foreign bodies appreciated.     Conjunctiva/sclera: Conjunctivae normal.     Pupils: Pupils are equal, round, and reactive to light.  Neck:     Thyroid: No thyroid mass or thyromegaly.     Vascular: No carotid bruit.     Trachea: Trachea normal.  Cardiovascular:     Rate and Rhythm: Normal rate and regular rhythm.     Pulses: Normal pulses.     Heart sounds: Normal heart sounds, S1 normal and S2 normal. No murmur heard.    No friction rub. No gallop.  Pulmonary:     Effort: Pulmonary effort is normal. No tachypnea or  respiratory distress.     Breath sounds: Normal breath sounds. No decreased breath sounds, wheezing, rhonchi or rales.  Abdominal:     General: Bowel sounds are normal.     Palpations: Abdomen is soft.     Tenderness: There is no abdominal tenderness.  Musculoskeletal:     Cervical back: Normal range of motion and neck supple.  Skin:    General: Skin is warm and dry.     Findings: No rash.  Neurological:     Mental Status: She is alert.  Psychiatric:  Mood and Affect: Mood is not anxious or depressed.        Speech: Speech normal.        Behavior: Behavior normal. Behavior is cooperative.        Thought Content: Thought content normal.        Judgment: Judgment normal.       Results for orders placed or performed during the hospital encounter of 02/14/22  CBC  Result Value Ref Range   WBC 5.6 4.0 - 10.5 K/uL   RBC 3.06 (L) 3.87 - 5.11 MIL/uL   Hemoglobin 9.6 (L) 12.0 - 15.0 g/dL   HCT 29.0 (L) 36.0 - 46.0 %   MCV 94.8 80.0 - 100.0 fL   MCH 31.4 26.0 - 34.0 pg   MCHC 33.1 30.0 - 36.0 g/dL   RDW 14.4 11.5 - 15.5 %   Platelets 170 150 - 400 K/uL   nRBC 0.0 0.0 - 0.2 %  Basic metabolic panel  Result Value Ref Range   Sodium 140 135 - 145 mmol/L   Potassium 3.9 3.5 - 5.1 mmol/L   Chloride 109 98 - 111 mmol/L   CO2 24 22 - 32 mmol/L   Glucose, Bld 98 70 - 99 mg/dL   BUN 28 (H) 8 - 23 mg/dL   Creatinine, Ser 2.04 (H) 0.44 - 1.00 mg/dL   Calcium 9.6 8.9 - 10.3 mg/dL   GFR, Estimated 24 (L) >60 mL/min   Anion gap 7 5 - 15  Brain natriuretic peptide  Result Value Ref Range   B Natriuretic Peptide 244.6 (H) 0.0 - 100.0 pg/mL  Troponin I (High Sensitivity)  Result Value Ref Range   Troponin I (High Sensitivity) 6 <18 ng/L     COVID 19 screen:  No recent travel or known exposure to COVID19 The patient denies respiratory symptoms of COVID 19 at this time. The importance of social distancing was discussed today.   Assessment and Plan    Problem List Items  Addressed This Visit     Acute kidney injury superimposed on chronic kidney disease ([place])    Will follow kidney function closely on diuretic.      Acute on chronic diastolic CHF (congestive heart failure) ([facility])    No current fluid overload.      Atrial fibrillation with rapid ventricular response ([facility])   Essential hypertension    Chronic Improved control with increase in  hydralazine q 8 hours  and  continuing lasix 40 mg daily per cardiology.       Nausea - Primary     Acute   Viral illness versus medication SE versus other... eval with labs.  Can use phenergan prn.      Relevant Orders   Lipase (Completed)   Comprehensive metabolic panel (Completed)   Orders Placed This Encounter  Procedures   Lipase   Comprehensive metabolic panel      Eliezer Lofts, MD

## 2022-02-22 NOTE — Patient Instructions (Addendum)
Please stop at the lab to have labs drawn. ?Keep up with fluid intake, eat bland food. If able add back the protein shakes. ? Can use Pepcid AC twice daily daily for indigestion. ? Continue other medication for now. ? ? ? ?

## 2022-02-23 ENCOUNTER — Other Ambulatory Visit: Payer: Self-pay | Admitting: Family Medicine

## 2022-02-23 LAB — COMPREHENSIVE METABOLIC PANEL
ALT: 23 U/L (ref 0–35)
AST: 23 U/L (ref 0–37)
Albumin: 4.4 g/dL (ref 3.5–5.2)
Alkaline Phosphatase: 49 U/L (ref 39–117)
BUN: 45 mg/dL — ABNORMAL HIGH (ref 6–23)
CO2: 28 mEq/L (ref 19–32)
Calcium: 9.8 mg/dL (ref 8.4–10.5)
Chloride: 100 mEq/L (ref 96–112)
Creatinine, Ser: 2.85 mg/dL — ABNORMAL HIGH (ref 0.40–1.20)
GFR: 15.23 mL/min — ABNORMAL LOW (ref >60.00)
Glucose, Bld: 122 mg/dL — ABNORMAL HIGH (ref 70–99)
Potassium: 3.4 mEq/L — ABNORMAL LOW (ref 3.5–5.1)
Sodium: 138 mEq/L (ref 135–145)
Total Bilirubin: 0.8 mg/dL (ref 0.2–1.2)
Total Protein: 7.6 g/dL (ref 6.0–8.3)

## 2022-02-23 LAB — LIPASE: Lipase: 35 U/L (ref 11.0–59.0)

## 2022-02-23 MED ORDER — POTASSIUM CHLORIDE CRYS ER 20 MEQ PO TBCR: 20.0000 meq | EXTENDED_RELEASE_TABLET | Freq: Every day | ORAL | 3 refills | Status: DC

## 2022-02-23 NOTE — Progress Notes (Signed)
See my chart note.

## 2022-02-23 NOTE — Progress Notes (Signed)
Please see the the metabolic panel [name] had at my office on May 16.  She was seen that day feeling weak, decreased appetite and nauseous x 6 days.  She was due to have a basic metabolic panel today at your office but asked if I would check it while she was at my office.  It appears that increasing the Lasix to 40 mg daily has worsened her kidney function significantly and decreased her potassium slightly.  I have suggested she start a low-dose potassium pill and decrease the furosemide to 20 mg daily. Does this sound good to you?  ?She did have significant improvement in her peripheral edema as well as her blood pressure with the addition of the furosemide and increase of the hydralazine. ? ?BP Readings from Last 3 Encounters: ?02/22/22 : 110/60 ?02/17/22 : (!) 160/64 ?02/14/22 : (!) 150/63 ? ?Wt Readings from Last 3 Encounters: ?02/22/22 : 142 lb (64.4 kg) ?02/17/22 : 145 lb 3.2 oz (65.9 kg) ?02/14/22 : 147 lb 11.3 oz (67 kg) ? ? ?

## 2022-02-25 ENCOUNTER — Other Ambulatory Visit (HOSPITAL_BASED_OUTPATIENT_CLINIC_OR_DEPARTMENT_OTHER): Payer: Self-pay | Admitting: Cardiovascular Disease

## 2022-03-03 ENCOUNTER — Telehealth (INDEPENDENT_AMBULATORY_CARE_PROVIDER_SITE_OTHER): Payer: Medicare Other | Admitting: Family Medicine

## 2022-03-03 ENCOUNTER — Encounter: Payer: Self-pay | Admitting: Family Medicine

## 2022-03-03 VITALS — BP 108/53 | HR 51 | Temp 97.3°F | Resp 16 | Ht 63.0 in | Wt 145.0 lb

## 2022-03-03 DIAGNOSIS — R630 Anorexia: Secondary | ICD-10-CM | POA: Insufficient documentation

## 2022-03-03 DIAGNOSIS — R110 Nausea: Secondary | ICD-10-CM | POA: Insufficient documentation

## 2022-03-03 DIAGNOSIS — I5033 Acute on chronic diastolic (congestive) heart failure: Secondary | ICD-10-CM

## 2022-03-03 DIAGNOSIS — R609 Edema, unspecified: Secondary | ICD-10-CM

## 2022-03-03 DIAGNOSIS — I10 Essential (primary) hypertension: Secondary | ICD-10-CM | POA: Diagnosis not present

## 2022-03-03 MED ORDER — ONDANSETRON HCL 4 MG PO TABS: 4.0000 mg | ORAL_TABLET | Freq: Three times a day (TID) | ORAL | 0 refills | Status: DC | PRN

## 2022-03-03 NOTE — Assessment & Plan Note (Signed)
Improved control despite being off of diuretics.  She is now using  Hydralazine 50 mg 1 tablet every 8 hours Losartan 100 mg p.o. daily metoprolol 25 mg half tablet as needed for A-fib

## 2022-03-03 NOTE — Progress Notes (Signed)
VIRTUAL VISIT A virtual visit is felt to be most appropriate for this patient at this time.   I connected with the patient on 03/03/22 at  3:20 PM EDT by virtual telehealth platform and verified that I am speaking with the correct person using two identifiers.   I discussed the limitations, risks, security and privacy concerns of performing an evaluation and management service by  virtual telehealth platform and the availability of in person appointments. I also discussed with the patient that there may be a patient responsible charge related to this service. The patient expressed understanding and agreed to proceed.  Patient location: Home Provider Location:  [facility] [place] Participants: [name] and [name]   Chief Complaint  Patient presents with   Medication Problem   Nausea    Cant eat     History of Present Illness: 81 year old female with history of diastolic dysfunction (ejection fraction 12/29/2021 echo 60 to 65%) presents with continued decreased appetite and nausea.  At last office visit [name] had reported feeling weak decreased appetite and nausea.  Basic metabolic panel showed that the increase in Lasix to 40 mg had worsened her kidney function.  I suggested that she start a low-dose potassium pill and decrease the furosemide to 20 mg daily.   She is on visit with her daughter.  She continues to have nausea and decreased appetite.. cannot eat anything, no specific trigger. She felt that all her symptoms seem to be related to are stopping her chlorthalidone for acute renal insufficiency in April of this year.  This medicine was stopped, peripheral edema was worsened and triggered an emergency room visit May 8 where she was started on furosemide.  From that time on she has felt nauseous with no appetite.  Her furosemide was increased further on May 11 and she felt worse after this with new weakness.   The patient and her daughter feel that she could have  furosemide allergy.. so she stopped it... she has had more energy, no further weakness but no change in nausea. No abd pain, no fever.  Swelling has not recurred off the furosemide. She denies shortness of breath wheezing or peripheral edema. BP Readings from Last 3 Encounters:  03/03/22 (!) 108/53  02/22/22 110/60  02/17/22 (!) 160/64    Wt Readings from Last 3 Encounters:  03/03/22 145 lb (65.8 kg)  02/22/22 142 lb (64.4 kg)  02/17/22 145 lb 3.2 oz (65.9 kg)   She has appt in 03/2022 with nephrology.  COVID 19 screen No recent travel or known exposure to COVID19 The patient denies respiratory symptoms of COVID 19 at this time.  The importance of social distancing was discussed today.   Review of Systems  Constitutional:  Negative for chills and fever.  HENT:  Negative for congestion and ear pain.   Eyes:  Negative for pain and redness.  Respiratory:  Negative for cough and shortness of breath.   Cardiovascular:  Negative for chest pain, palpitations and leg swelling.  Gastrointestinal:  Positive for nausea. Negative for abdominal pain, blood in stool, constipation, diarrhea and vomiting.  Genitourinary:  Negative for dysuria.  Musculoskeletal:  Negative for falls and myalgias.  Skin:  Negative for rash.  Neurological:  Negative for dizziness.  Psychiatric/Behavioral:  Negative for depression. The patient is not nervous/anxious.      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 cath 2/09: no CAD, EF 70%   GERD (gastroesophageal reflux disease) 07/26/2021   HYPERLIPIDEMIA    HYPERTENSION    Mild Aortic insufficiency    Moderate mitral regurgitation    Moderate tricuspid regurgitation    OSTEOPENIA    PAF (paroxysmal atrial fibrillation) ([place])    a. s/p ablation 2013; b. CHADS2VASc -> 4 (HTN, age x 2, female)-->Eliquis.   PAT  (paroxysmal atrial tachycardia) ([place])    a. 04/2019 Zio: Occas PACs and rare PVCs. 21 atrial runs - longest 20 beats, max rate 169.   Pneumonia 2009   RA (rheumatoid arthritis) ([facility])    Sinus Bradycardia    a. asymptomatic but prevents use of AVN blocking agents; b. 04/2019 Zio: Avg HR 61 (37-109).   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 05/2019 Echo: EF 60-65%. DD. RVSP 41.13mHg. Mod dil LA. Mod MR/TR. Mild AI.    reports that she has never smoked. She has never used smokeless tobacco. She reports that she does not drink alcohol and does not use drugs.   Current Outpatient Medications:    amiodarone (PACERONE) 200 MG tablet, Taking one tablet by mouth daily, Disp: , Rfl:    B Complex Vitamins (VITAMIN B COMPLEX) TABS, Take 1 tablet by mouth daily. , Disp: , Rfl:    D 1000 25 MCG (1000 UT) capsule, TAKE 1 CAPSULE BY MOUTH AT BEDTIME., Disp: 30 capsule, Rfl: 3   ELIQUIS 5 MG TABS tablet, TAKE 1 TABLET BY MOUTH TWICE A DAY, Disp: 180 tablet, Rfl: 1   furosemide (LASIX) 40 MG tablet, Take 1 tablet (40 mg total) by mouth daily., Disp: 90 tablet, Rfl: 3   hydrALAZINE (APRESOLINE) 50 MG tablet, Take 1 tablet (50 mg total) by mouth every 8 (eight) hours., Disp: 270 tablet, Rfl: 3   losartan (COZAAR) 100 MG tablet, TAKE 1 TABLET BY MOUTH ONCE DAILY, Disp: 90 tablet, Rfl: 2   LUMIGAN 0.01 % SOLN, Place 1 drop into both eyes 2 (two) times daily., Disp: , Rfl:    metoprolol tartrate (LOPRESSOR) 25 MG tablet, TAKE 1/2 TABLET AS NEEDED FOR AFIB, Disp: 30 tablet, Rfl: 1   pantoprazole (PROTONIX) 40 MG tablet, Take 1 tablet (40 mg total) by mouth daily., Disp: 30 tablet, Rfl: 11   potassium chloride SA (KLOR-CON M) 20 MEQ tablet, Take 1 tablet (20 mEq total) by mouth daily. Take when taking furosemide., Disp: 30 tablet, Rfl: 3   rosuvastatin (CRESTOR) 10 MG tablet, TAKE 1 TABLET BY MOUTH ONCE DAILY, Disp: 90 tablet, Rfl: 2   Observations/Objective: Blood pressure (!) 108/53, pulse (!)  51, temperature (!) 97.3 F (36.3 C), resp. rate 16, height '5\' 3"'$  (1.6 m), weight 145 lb (65.8 kg).  Physical Exam  Physical Exam Constitutional:      General: The patient is not in acute distress. Pulmonary:     Effort: Pulmonary effort is normal. No respiratory distress.  Neurological:     Mental Status: The patient is alert and oriented to person, place, and time.  Psychiatric:        Mood and Affect: Mood normal.        Behavior: Behavior normal.   Assessment and Plan Problem List Items Addressed This Visit     Acute on chronic diastolic CHF (congestive heart failure) ([facility]    Per report peripheral edema has remained resolved despite stopping furosemide altogether. We discussed the possibility of restarting chlorthalidone  but I am hesitant given her decreased renal function.  She will just use furosemide 20 mg as needed if weight is increasing 3 pounds in 24 hours or if peripheral edema returns.  She can discuss fluid status with nephrologist at her upcoming appointment in June       Decreased appetite   Essential hypertension   Nausea - Primary    Acute, not improving She will try a trial of Zofran to use for nausea.  Lab evaluation showed no clear cause of nausea.  If her appetite does not improve with improvement of nausea we can consider a course of Remeron to stimulate appetite.  She denies depression and anxiety as a cause. She denies reflux or heartburn or abdominal pain of any type.       Peripheral edema    Resolved off diuretics at the moment.        Meds ordered this encounter  Medications   ondansetron (ZOFRAN) 4 MG tablet    Sig: Take 1 tablet (4 mg total) by mouth every 8 (eight) hours as needed for nausea or vomiting.    Dispense:  20 tablet    Refill:  0      I discussed the assessment and treatment plan with the patient. The patient was provided an opportunity to ask questions and all were answered. The patient agreed with the plan and demonstrated  an understanding of the instructions.   The patient was advised to call back or seek an in-person evaluation if the symptoms worsen or if the condition fails to improve as anticipated.     Jacqueline Lofts, MD

## 2022-03-03 NOTE — Assessment & Plan Note (Signed)
Acute, not improving She will try a trial of Zofran to use for nausea.  Lab evaluation showed no clear cause of nausea.  If her appetite does not improve with improvement of nausea we can consider a course of Remeron to stimulate appetite.  She denies depression and anxiety as a cause. She denies reflux or heartburn or abdominal pain of any type.

## 2022-03-03 NOTE — Assessment & Plan Note (Signed)
Resolved off diuretics at the moment.

## 2022-03-03 NOTE — Assessment & Plan Note (Signed)
Acute worsening of chronic kidney disease.  Initially in April she had issues with decreased renal function so chlorthalidone was stopped.  When she was placed on furosemide her renal function again declined.  She has upcoming appoint with nephrology for further evaluation and recommendations.

## 2022-03-03 NOTE — Patient Instructions (Addendum)
Call  next week with report on whether  nausea is improving with ondansetron.  Use lasix as needed for peripheral swelling

## 2022-03-03 NOTE — Assessment & Plan Note (Signed)
Per report peripheral edema has remained resolved despite stopping furosemide altogether. We discussed the possibility of restarting chlorthalidone but I am hesitant given her decreased renal function.  She will just use furosemide 20 mg as needed if weight is increasing 3 pounds in 24 hours or if peripheral edema returns.  She can discuss fluid status with nephrologist at her upcoming appointment in June

## 2022-03-04 ENCOUNTER — Encounter: Payer: Self-pay | Admitting: Family Medicine

## 2022-03-04 MED ORDER — PROMETHAZINE HCL 25 MG PO TABS: 25.0000 mg | ORAL_TABLET | Freq: Three times a day (TID) | ORAL | 0 refills | Status: DC | PRN

## 2022-03-04 NOTE — Telephone Encounter (Signed)
Spoke with [name] at [facility].  He states there is an interaction with Zofran and Amiodarone.  It can cause QT intervals.  Please advise.

## 2022-03-04 NOTE — Telephone Encounter (Signed)
Call Given the interaction with amiodarone and Zofran I will call in instead Phenergan to help with nausea.  This medication can cause her to feel sleepy so if it is sedating please let me know early next week.

## 2022-03-17 ENCOUNTER — Encounter (HOSPITAL_BASED_OUTPATIENT_CLINIC_OR_DEPARTMENT_OTHER): Payer: Self-pay | Admitting: Cardiovascular Disease

## 2022-03-17 ENCOUNTER — Telehealth (INDEPENDENT_AMBULATORY_CARE_PROVIDER_SITE_OTHER): Payer: Medicare Other | Admitting: Cardiovascular Disease

## 2022-03-17 VITALS — Ht 63.0 in | Wt 132.0 lb

## 2022-03-17 DIAGNOSIS — I38 Endocarditis, valve unspecified: Secondary | ICD-10-CM | POA: Diagnosis not present

## 2022-03-17 DIAGNOSIS — I10 Essential (primary) hypertension: Secondary | ICD-10-CM | POA: Diagnosis not present

## 2022-03-17 DIAGNOSIS — N179 Acute kidney failure, unspecified: Secondary | ICD-10-CM | POA: Diagnosis not present

## 2022-03-17 DIAGNOSIS — I4819 Other persistent atrial fibrillation: Secondary | ICD-10-CM

## 2022-03-17 DIAGNOSIS — I2510 Atherosclerotic heart disease of native coronary artery without angina pectoris: Secondary | ICD-10-CM | POA: Diagnosis not present

## 2022-03-17 DIAGNOSIS — N184 Chronic kidney disease, stage 4 (severe): Secondary | ICD-10-CM

## 2022-03-17 MED ORDER — APIXABAN 2.5 MG PO TABS: 2.5000 mg | ORAL_TABLET | Freq: Two times a day (BID) | ORAL | 3 refills | Status: DC

## 2022-03-17 NOTE — Patient Instructions (Addendum)
Medication Instructions:  DECREASE ELIQUIS TO 2.5 MG TWICE A DAY   *If you need a refill on your cardiac medications before your next appointment, please call your pharmacy*  Lab Work: NONE   Testing/Procedures: NONE  Follow-Up: At [facility], you and your health needs are our priority.  As part of our continuing mission to provide you with exceptional heart care, we have created designated Provider Care Teams.  These Care Teams include your primary Cardiologist (physician) and Advanced Practice Providers (APPs -  Physician Assistants and Nurse Practitioners) who all work together to provide you with the care you need, when you need it.  We recommend signing up for the patient portal called "MyChart".  Sign up information is provided on this After Visit Summary.  MyChart is used to connect with patients for Virtual Visits (Telemedicine).  Patients are able to view lab/test results, encounter notes, upcoming appointments, etc.  Non-urgent messages can be sent to your provider as well.   To learn more about what you can do with MyChart, go to [email or web address].    Your next appointment:     07/19/2022 AT 10:20 AM

## 2022-03-17 NOTE — Addendum Note (Signed)
Addended by: Alvina Filbert B on: 03/17/2022 12:16 PM   Modules accepted: Orders

## 2022-03-17 NOTE — Progress Notes (Signed)
Virtual Visit via Video Note   Because of [name]'s co-morbid illnesses, she is at least at moderate risk for complications without adequate follow up.  This format is felt to be most appropriate for this patient at this time.  All issues noted in this document were discussed and addressed.  A limited physical exam was performed with this format.  Please refer to the patient's chart for her consent to telehealth for [facility].  The patient was identified using 2 identifiers.  Date:  03/17/2022   ID:  [name], DOB 1940/12/29, MRN [id number]  Patient Location: Home Provider Location: Home Office  PCP:  [name], MD  Cardiologist:  [name], MD  Electrophysiologist:  None   Evaluation Performed:  Follow-Up Visit  Chief Complaint:  hypertension  History of Present Illness:     The patient does not have symptoms concerning for COVID-19 infection (fever, chills, cough, or new shortness of breath).   [name] is a 81 y.o. female with a hx of persistent atrial fibrillation status post ablation, bradycardia, CKD 2, mitral regurgitation, aortic regurgitation, hypertension, and hyperlipidemia here for follow up. She had an echo 05/14/2019 that revealed LVEF 60 to 65% with grade 2 diastolic dysfunction and mildly elevated pulmonary pressures.  She was initially seen 12/2020 to establish care in the hypertension clinic.  She was first diagnosed with hypertension many decades ago.  In the past it was well-controlled.  However in the last year she has struggled.  Lately her blood pressure has been in the 160s at home.  She last saw [name] on 11/22/2019.  Prior to that losartan was increased to 50 mg.  Her blood pressure remained above goal since making that change.  [name] increased losartan to '100mg'$ .  She followed up with [name], NP, and hydralazine '25mg'$  bid was added on 2/26.  At her first visit hydralazine was increased.  She saw [name], NP on  12/2019 an dit was increased to '75mg'$ .  She started to work out at the [employer].  She was seen in the ED with palpitations 03/2020.  She was in sinus rhythm at the time.  This has resolved.   It was noted that she had inadvertently stopped taking her hydrochlorothiazide. She this was resumed when she saw her pharmacist on 03/2020.  Hydralazine was reduced back to 50 mg twice daily.  HCTZ was changed to chlorthalidone. Her lipids were poorly controlled so she was started to rosuvastatin. 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  At her last appointment she reported palpitations but was noted to be in sinus rhythm at that time.  Her renal function was worsening so chlorthalidone was reduced and she was started on clonidine.  Renal artery dopplers were normal 08/2021.  08/2021 she was seen in the hospital.  She reported shortness of breath and chest pain.  She also had increasing lower extremity edema.  She was noted to be in atrial fibrillation with RVR in the 90s to 150s.  She was given IV diltiazem and IV metoprolol.  High-sensitivity troponin was minimally elevated 22 and flat.  It was thought to be demand ischemia in the setting of A. fib with RVR.  With the initiation of IV diltiazem her heart rate quickly improved.  She was started on metoprolol 12.5 mg twice daily.  Her home hydralazine was held and she was asked to follow-up closely with cardiology. When she followed  up in clinic she was bradycardic.  Clonidine and metoprolol were both discontinued she was instructed to take the metoprolol only as needed.  She was continued on losartan and chlorthalidone.  She followed up with EP and remained in sinus rhythm.  Given her chronic kidney disease she was not a candidate for dofetilide.  They discussed ablation versus amiodarone.  She was started on a 2-week monitor to better assess her heart rate and rhythm. She had 20 runs of SVT up to 17 beats. Her average heart rate was 53 bpm. She did have  some overnight bradycardia to the mid 30s.   At her last appointment she was feeling better, with well controlled blood pressure and consistent heart rates in the 40s. She saw [name] 10/12/21 and was doing well. It was noted she had continued episodes of Afib but had been in sinus rhythm for most of the interim. She was started on 200 mg amiodarone twice daily for 1 month followed by 200 mg daily. On 11/18/2021 she presented to the ED 1 week after a head injury due to persistent pain and raised lesion. She had accidentally struck her occiput on the underside of a cabinet. Workup revealed a small, non-bleeding occipital hematoma suitable for outpatient management.   She is accompanied by a family member. Today, she states she is feeling good overall. On average she believes her blood pressure is in the 160s at home. She usually checks it often, but forgot to bring her log with her today. They agree that a bradycardic heart rate as shown on her EKG is fairly typical for her. She reports that amiodarone has been helping her significantly. Her arrhythmic episodes have been much more controlled. Lately she has been exercising only a little.  She would like to work up to returning to the gym 3 times a week. However, she was limited by a busy schedule and her recent heart issues. Also, she states her diet is "not good." She denies any chest pain, shortness of breath, or peripheral edema. No lightheadedness, headaches, syncope, orthopnea, or PND  She saw her PCP and chlorthalidone was discontinued 2/2 AKI.  After that she started having lower extremity edema and her blood pressures increased.  She was seen in the ED 02/14/2022.  She also had mild increased exertional dyspnea.  BNP was 244.  Cardiac enzymes were negative.  She was given Lasix 20 mg to be taken for 5 days.  She has not had any improvement in her edema.  At the last visit her home blood pressures ranged 110s to 150s over 60s to 70s.  She has follow-up  scheduled with nephrology but has not seen them yet.  She has no chest pain and her breathing is stable.  Hydralazine was increased.  She saw her PCP week and her blood pressure was 110/60.  Furosemide was reduced to 20 mg.  She followed up again 5/25 and continued to have nausea and decreased appetite.  She stopped the furosemide and felt better.  Lately she has felt much better.  Her appetite is back.  Her blood pressures have been very well controlled.  She has not had any lightheadedness or dizziness.  She is feeling like her old self again.  She has not had any lower extremity edema but still has Lasix to take only as needed.  She sees nephrology tomorrow.  Previous antihypertensives: Amlodipine- ankle swelling Spironolactone- unsure why it was stopped Hydralazine- swelling with '100mg'$ , OK with '50mg'$  Clonidine- worked  well.  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 cath 2/09: no CAD, EF 70%   GERD (gastroesophageal reflux disease) 07/26/2021   HYPERLIPIDEMIA    HYPERTENSION    Mild Aortic insufficiency    Moderate mitral regurgitation    Moderate tricuspid regurgitation    OSTEOPENIA    PAF (paroxysmal atrial fibrillation) ([place])    a. s/p ablation 2013; b. CHADS2VASc -> 4 (HTN, age x 2, female)-->Eliquis.   PAT (paroxysmal atrial tachycardia) ([place])    a. 04/2019 Zio: Occas PACs and rare PVCs. 21 atrial runs - longest 20 beats, max rate 169.   Pneumonia 2009   RA (rheumatoid arthritis) ([facility])    Sinus Bradycardia    a. asymptomatic but prevents use of AVN blocking agents; b. 04/2019 Zio: Avg HR 61 (37-109).   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 05/2019 Echo: EF 60-65%. DD. RVSP 41.70mHg. Mod dil LA. Mod MR/TR. Mild AI.    Past Surgical History:  Procedure  Laterality Date   ABLATION OF DYSRHYTHMIC FOCUS     CARDIAC CATHETERIZATION     COLONOSCOPY  04/18/08   Partial hysterectomy--1979     Thoracentesis   12/20/07      Current Medications: Current Meds  Medication Sig   amiodarone (PACERONE) 200 MG tablet Taking one tablet by mouth daily   B Complex Vitamins (VITAMIN B COMPLEX) TABS Take 1 tablet by mouth daily.    D 1000 25 MCG (1000 UT) capsule TAKE 1 CAPSULE BY MOUTH AT BEDTIME.   ELIQUIS 5 MG TABS tablet TAKE 1 TABLET BY MOUTH TWICE A DAY   furosemide (LASIX) 40 MG tablet Take 1 tablet (40 mg total) by mouth daily.   hydrALAZINE (APRESOLINE) 50 MG tablet Take 1 tablet (50 mg total) by mouth every 8 (eight) hours.   losartan (COZAAR) 100 MG tablet TAKE 1 TABLET BY MOUTH ONCE DAILY   LUMIGAN 0.01 % SOLN Place 1 drop into both eyes 2 (two) times daily.   metoprolol tartrate (LOPRESSOR) 25 MG tablet TAKE 1/2 TABLET AS NEEDED FOR AFIB   ondansetron (ZOFRAN) 4 MG tablet Take 1 tablet (4 mg total) by mouth every 8 (eight) hours as needed for nausea or vomiting.   pantoprazole (PROTONIX) 40 MG tablet Take 1 tablet (40 mg total) by mouth daily.   potassium chloride SA (KLOR-CON M) 20 MEQ tablet Take 1 tablet (20 mEq total) by mouth daily. Take when taking furosemide.   promethazine (PHENERGAN) 25 MG tablet Take 1 tablet (25 mg total) by mouth every 8 (eight) hours as needed for nausea or vomiting.   rosuvastatin (CRESTOR) 10 MG tablet TAKE 1 TABLET BY MOUTH ONCE DAILY     Allergies:   Amlodipine, Celecoxib, and Tramadol   Social History   Socioeconomic History   Marital status: Married    Spouse name: Not on file   Number of children: Not on file   Years of education: Not on file   Highest education level: Not on file  Occupational History   Not on file  Tobacco Use   Smoking status: Never   Smokeless tobacco: Never  Vaping Use   Vaping Use: Never used  Substance and Sexual Activity   Alcohol use: No  Alcohol/week: 0.0 standard  drinks of alcohol   Drug use: No   Sexual activity: Never    Birth control/protection: None  Other Topics Concern   Not on file  Social History Narrative   Marital Status: widow x 1 yr   Children: 36, grandchildren 42, numerous great grand children   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 Full Code ( reviewed 2015)       Occasional exercise.   Diet: fruits and veggies, lean meats.   Social Determinants of Health   Financial Resource Strain: Not on file  Food Insecurity: Not on file  Transportation Needs: Not on file  Physical Activity: Not on file  Stress: Not on file  Social Connections: Not on file     Family History: The patient's family history includes Atrial fibrillation in her brother; Breast cancer (age of onset: 83) in her paternal aunt; Diabetes in an other family member; Heart attack in her brother; Heart disease in her brother and mother; Pancreatic cancer in her father.  ROS:   Please see the history of present illness.    All other systems reviewed and are negative.  EKGs/Labs/Other Studies Reviewed:    EKG:     12/02/2021: Sinus bradycardia. Rate 49 bpm. 08/19/2021: Ectopic atrial bradycardia.  Rate 47 bpm. 07/26/2021: Sinus rhythm Rate 65 03/23/2021: EKG was not ordered. 11/27/2020: Sinus rhythm.  Rate 60 bpm. 12/06/19: sinus bradycardia.  Rate 53 bpm.  Non-specific ST-T changes.  Monitor 09/2021: Patch Wear Time:  12 days and 16 hours   Predominant underlying rhythm was sinus rhythm Less than 1% ventricular and supraventricular ectopy 20 SVT runs, longest 17 beats, fastest 6 beats at 176 bpm No triggered events noted  Bilateral Renal Artery Doppler 09/08/2021: Summary:  Largest  Aortic Diameter: 2.1 cm     Renal:     Right: Abnormal size for the right kidney. Normal cortical thickness         of right kidney. No evidence of right renal artery stenosis.         RRV flow present.  Left:  Cyst(s) noted. LRV flow present. No evidence of left renal         artery stenosis. Abnormal cortical thickness of the left         kidney. Abnormal size for the left kidney.  Mesenteric:  Normal Celiac artery , Superior Mesenteric artery and Inferior Mesenteric  artery findings.   Echo 05/14/19:  1. The left ventricle has normal systolic function with an ejection  fraction of 60-65%. The cavity size was normal. There is mildly increased  left ventricular wall thickness. Left ventricular diastolic Doppler  parameters are consistent with  pseudonormalization.   2. The right ventricle has normal systolic function. The cavity was  normal. There is no increase in right ventricular wall thickness. Right  ventricular systolic pressure is mildly elevated with an estimated  pressure of 41.7 mmHg.   3. Left atrial size was moderately dilated.   4. Incompletely evaluated hypoechoic structure noted in the liver, most  likely a cyst.   5. The mitral valve is degenerative. Mild thickening of the mitral valve  leaflet. Mitral valve regurgitation is moderate by color flow Doppler.   6. Tricuspid valve regurgitation is moderate.   7. The  aortic valve has an indeterminate number of cusps. Mild thickening  of the aortic valve. Mild calcification of the aortic valve. Aortic valve  regurgitation is mild by color flow Doppler.   8. The aorta is normal in size and structure.   Recent Labs: 08/18/2021: Magnesium 2.0 01/13/2022: TSH 1.522 02/14/2022: B Natriuretic Peptide 244.6; Hemoglobin 9.6; Platelets 170 02/22/2022: ALT 23; BUN 45; Creatinine, Ser 2.85; Potassium 3.4; Sodium 138   Recent Lipid Panel    Component Value Date/Time   CHOL 205 (H) 08/18/2021 0325   TRIG 47 08/18/2021 0325   HDL  78 08/18/2021 0325   CHOLHDL 2.6 08/18/2021 0325   VLDL 9 08/18/2021 0325   LDLCALC 118 (H) 08/18/2021 0325   LDLDIRECT 104.4 11/26/2012 0746    Physical Exam:    Ht '5\' 3"'$  (1.6 m)   Wt 132 lb (59.9 kg)   BMI 23.38 kg/m  GENERAL: Well-appearing.  No acute distress. HEENT: Pupils equal round.  Oral mucosa unremarkable NECK:  No jugular venous distention, no visible thyromegaly EXT:  No edema, no cyanosis no clubbing SKIN:  No rashes no nodules NEURO:  Speech fluent.  Cranial nerves grossly intact.  Moves all 4 extremities freely PSYCH:  Cognitively intact, oriented to person place and time   ASSESSMENT:    No diagnosis found.    PLAN:    Mitral regurgitation Moderate MR stable from prior on her echo 12/2021.  Right atrial pressure was 3.  Lower extremity edema has improved and was more likely due to venous insufficiency than heart failure or valvular heart disease.  No interventions indicated at this time.   Stage 3b chronic kidney disease (CKD) (HCC) Chlorthalidone was discontinued due to worsening renal function.  She sees a nephrologist tomorrow and I am sure she will have labs checked at that time.  We will not order repeat BMP today, though it definitely think she needs oine.  No longer on furosemide and less   HYPERLIPIDEMIA Continue rosuvastatin.     SINUS BRADYCARDIA Asymptomatic.  Avoid nodal agents.  She takes metoprolol only when she has A-fib with RVR.  Her arrhythmias have been more stable since starting amiodarone.  She is very adamant about avoiding a pacemaker if at all possible.   Persistent atrial fibrillation Currently in sinus rhythm.  Doing well on amiodarone.  Reduce Eliquis to 2.'5mg'$ .  She turns 81 in 3 months and her creatinine has been consistently over 2 for a while.   Essential hypertension Blood pressure has been much more stable lately.  She is doing well on hydralazine, and losartan.  Time spent: 17 minutes-Greater than 50% of this time was  spent in counseling, explanation of diagnosis, planning of further management, and coordination of care.   Disposition:  FU with Cornie Mccomber C. Oval Linsey, MD, [facility] in 4 months.  Medication Adjustments/Labs and Tests Ordered: Current medicines are reviewed at length with the patient today.  Concerns regarding medicines are outlined above.   No orders of the defined types were placed in this encounter.  No orders of the defined types were placed in this encounter.   Signed, [name], MD  03/17/2022 9:38 AM    [place]

## 2022-03-18 ENCOUNTER — Other Ambulatory Visit: Payer: Self-pay | Admitting: Internal Medicine

## 2022-03-18 DIAGNOSIS — N1832 Chronic kidney disease, stage 3b: Secondary | ICD-10-CM

## 2022-03-18 DIAGNOSIS — N390 Urinary tract infection, site not specified: Secondary | ICD-10-CM | POA: Diagnosis not present

## 2022-03-18 DIAGNOSIS — I129 Hypertensive chronic kidney disease with stage 1 through stage 4 chronic kidney disease, or unspecified chronic kidney disease: Secondary | ICD-10-CM | POA: Diagnosis not present

## 2022-03-18 DIAGNOSIS — E785 Hyperlipidemia, unspecified: Secondary | ICD-10-CM | POA: Diagnosis not present

## 2022-03-18 DIAGNOSIS — N179 Acute kidney failure, unspecified: Secondary | ICD-10-CM

## 2022-03-18 DIAGNOSIS — I4891 Unspecified atrial fibrillation: Secondary | ICD-10-CM | POA: Diagnosis not present

## 2022-03-18 DIAGNOSIS — N184 Chronic kidney disease, stage 4 (severe): Secondary | ICD-10-CM | POA: Diagnosis not present

## 2022-03-21 ENCOUNTER — Encounter: Payer: Self-pay | Admitting: Psychiatry

## 2022-03-21 ENCOUNTER — Telehealth: Payer: Self-pay | Admitting: Psychiatry

## 2022-03-21 ENCOUNTER — Ambulatory Visit (INDEPENDENT_AMBULATORY_CARE_PROVIDER_SITE_OTHER): Payer: Medicare Other | Admitting: Psychiatry

## 2022-03-21 VITALS — BP 133/52 | HR 53 | Ht 60.0 in | Wt 136.0 lb

## 2022-03-21 DIAGNOSIS — F0781 Postconcussional syndrome: Secondary | ICD-10-CM | POA: Diagnosis not present

## 2022-03-21 DIAGNOSIS — R200 Anesthesia of skin: Secondary | ICD-10-CM | POA: Diagnosis not present

## 2022-03-21 NOTE — Telephone Encounter (Signed)
[employer] medicare NPR sent to GI 

## 2022-03-21 NOTE — Patient Instructions (Addendum)
Post Concussive Syndrome:  Post-concussion syndrome is a complex disorder in which various symptoms -- such as headaches and dizziness -- last for weeks and sometimes months after the injury that caused the concussion. Concussion is a mild traumatic brain injury, usually occurring after a blow to the head. Loss of consciousness isn't required for a diagnosis of concussion or post-concussion syndrome. In fact, the risk of post-concussion syndrome doesn't appear to be associated with the severity of the initial injury. In most people, post-concussion syndrome symptoms occur within the first seven to 10 days and go away within three months, though they can persist for a year or more. Post-concussion syndrome treatments are aimed at easing specific symptoms.  Post-concussion symptoms include: Headaches  Dizziness  Fatigue  Irritability  Anxiety  Insomnia  Loss of concentration and memory  Noise and light sensitivity  Headaches that occur after a concussion can vary and may feel like tension-type headaches or migraines. Most, however, are tension-type headaches, which may be associated with a neck injury that happened at the same time as the head injury. In some cases, people experience behavior or emotional changes after a mild traumatic brain injury. Family members may notice that the person has become more irritable, suspicious, argumentative or stubborn. When to see a doctor See a doctor if you experience a head injury severe enough to cause confusion or amnesia -- even if you never lost consciousness. If a concussion occurs while you're playing a sport, don't go back in the game. Seek medical attention so that you don't risk worsening your injury. Causes: Some experts believe post-concussion symptoms are caused by structural damage to the brain or disruption of neurotransmitter systems, resulting from the impact that caused the concussion. Others believe post-concussion symptoms are related  to psychological factors, especially since the most common symptoms -- headache, dizziness and sleep problems -- are similar to those often experienced by people diagnosed with depression, anxiety or post-traumatic stress disorder. In many cases, both physiological effects of brain trauma and emotional reactions to these effects play a role in the development of symptoms. Researchers haven't determined why some people who've had concussions develop persistent post-concussion symptoms while others do not. No proven correlation between the severity of the injury and the likelihood of developing persistent post-concussion symptoms exists.  Risk Factors: Risk factors for developing post-concussion syndrome include: Age. Studies have found increasing age to be a risk factor for post-concussion syndrome.  Sex. Women are more likely to be diagnosed with post-concussion syndrome, but this may be because women are generally more likely to seek medical care.  Trauma. Concussions resulting from car collisions, falls, assaults and sports injuries are commonly associated with post-concussion syndrome. Treatment: There is no specific treatment for post-concussion syndrome. Instead, your doctor will treat the individual symptoms you're experiencing. The types of symptoms and their frequency are unique to each person. Headaches Medications commonly used for migraines or tension headaches, including some antidepressants, appear to be effective when these types of headaches are associated with post-concussion syndrome. Examples include: Amitriptyline. This medication has been widely used for post-traumatic injuries, as well as for symptoms commonly associated with post-concussion syndrome, such as irritability, dizziness and depression. Topiramate. Commonly used to treat migraines, topiramate (Qudexy XR, Topamax, Trokendi XR) may be effective in reducing headaches after head injury. Common side effects of topiramate  include weight loss and cognitive problems.  Gabapentin. Gabapentin (Gralise, Neurontin) is frequently used to treat a variety of types of pain and may be helpful in  treating post-traumatic headaches. A common side effect of gabapentin is drowsiness.  Other agents used to treat migraines and tension-type headaches may also be helpful in some individuals. Keep in mind that the overuse of over-the-counter and prescription pain relievers may contribute to persistent post-concussion headaches. Memory and thinking problems No medications are currently recommended specifically for the treatment of cognitive problems after mild traumatic brain injury. Time may be the best therapy for post-concussion syndrome if you have cognitive problems, as most of them go away on their own in the weeks to months following the injury. Certain forms of cognitive therapy may be helpful, including focused rehabilitation that provides training in how to use a pocket calendar, Librarian, academic or other techniques to work around memory deficits and attention skills. Relaxation therapy also may help. Dizziness Vestibular rehab (a specialized form of physical therapy can help this. Depression and anxiety The symptoms of post-concussion syndrome often improve after the affected person learns that there is a cause for his or her symptoms and that they will likely improve with time. Education about the disorder can ease a person's fears and help provide peace of mind. If you're experiencing new or increasing depression or anxiety after a concussion, some treatment options include: Psychotherapy. It may be helpful to discuss your concerns with a psychologist or psychiatrist who has experience in working with people with brain injury.  Medication. To combat anxiety or depression, antidepressants or anti-anxiety medications may be prescribed. Prevention: The only known way to prevent post-concussion syndrome is to avoid the head  injury in the first place. Avoiding head injuries Although you can't prepare for every potential situation, here are some tips for avoiding common causes of head injuries: Fasten your seat belt whenever you're traveling in a car, and be sure children are in age-appropriate safety seats. Children under 13 are safest riding in the back seat, especially if your car has air bags.  Use helmets whenever you or your children are bicycling, roller-skating, in-line skating, ice-skating, skiing, snowboarding, playing football, batting or running the bases in softball or baseball, skateboarding, or horseback riding. Wear a helmet when riding a motorcycle.  Take steps around the house to prevent falls, such as removing small area rugs, improving lighting and installing handrails.   Natural supplements that can reduce headaches: Magnesium Oxide or Magnesium Glycinate 500 mg at bed (up to 800 mg daily) Coenzyme Q10 300 mg in AM Vitamin B2- 200 mg twice a day  Add 1 supplement at a time since even natural supplements can have undesirable side effects. You can sometimes buy supplements cheaper (especially Coenzyme Q10) at www.[email or web address]://compton-perez.com/ or at [employer].  Magnesium: Magnesium (250 mg twice a day or 500 mg at bed) has a relaxant effect on smooth muscles such as blood vessels. Individuals suffering from frequent or daily headache usually have low magnesium levels which can be increase with daily supplementation of 400-800 mg. Three trials found 40-90% average headache reduction  when used as a preventative. Magnesium also demonstrated the benefit in menstrually related migraine.  Magnesium is part of the messenger system in the serotonin cascade and it is a good muscle relaxant.  It is also useful for constipation. Good sources include nuts, whole grains, and tomatoes. Side Effects: loose stool/diarrhea  Riboflavin (vitamin B 2) 200 mg twice a day. This vitamin assists nerve cells in the production of ATP a principal  energy storing molecule.  It is necessary for many chemical reactions in the body.  There have been at  least 3 clinical trials of riboflavin using 400 mg per day all of which suggested that migraine frequency can be decreased.  All 3 trials showed significant improvement in over half of migraine sufferers.  The supplement is found in bread, cereal, milk, meat, and poultry.  Most Americans get more riboflavin than the recommended daily allowance, however riboflavin deficiency is not necessary for the supplements to help prevent headache. Side effects: energizing, green urine  Coenzyme Q10: This is present in almost all cells in the body and is critical component for the conversion of energy.  Recent studies have shown that a nutritional supplement of CoQ10 can reduce the frequency of migraine attacks by improving the energy production of cells as with riboflavin.  Doses of 150 mg twice a day have been shown to be effective.  Melatonin: 5 mg before bedtime. Increasing evidence shows correlation between melatonin secretion and headache conditions.  Melatonin supplementation has decreased headache intensity and duration.  It is widely used as a sleep aid.

## 2022-03-21 NOTE — Progress Notes (Signed)
Referring:  Regan Lemming, MD [address] [place],  [place] [address]  PCP: [name], MD  Neurology was asked to evaluate [name], a 81 year old female for a chief complaint of headaches.  Our recommendations of care will be communicated by shared medical record.    CC:  headaches  History provided from self, daughter  HPI:  Medical co-morbidities: aortic valve stenosis, CAD, HTN, pAfib on eliquis, HLD  The patient presents for evaluation of headaches which began after a head trauma in February. She was under the sink, got up too quickly and hit her head. CTH at that time was unremarkable. After the accident she had frequent headaches, insomnia, and difficulty with her memory and concentration.  Continued to have a headache and presented to the ED 02/14/22 where [facility] showed no acute process. Headaches have improved over time. Currently she has ~2 headaches per week. No associated photophobia or phonophobia. She is nauseated, but thinks this is medication related as it started after she was prescribed Lasix for edema. She takes Tylenol as needed which helps a little. She is not sure how long her headaches last, but they typically do not last the whole day day. Continues to struggle with insomnia and memory/concentration issues. She has also noticed decreased sensation in her left upper extremity, which she does not believe was present before the accident.  Headache History: Onset: February 2023 Triggers: no Aura: no Location: left occiput Quality/Description: throbbing Associated Symptoms:  Photophobia: no  Phonophobia: no  Nausea: yes Worse with activity?: yes  Headache days per month: 8 Headache free days per month: 22  Current Treatment: Abortive Tylenol  Preventative none  Prior Therapies                                 Metoprolol 12.5 mg  Losartan 100 mg daily Zofran Phenergan  LABS: CBC    Component Value Date/Time   WBC 5.6 02/14/2022 1000   RBC  3.06 (L) 02/14/2022 1000   HGB 9.6 (L) 02/14/2022 1000   HGB 11.1 12/06/2019 1119   HCT 29.0 (L) 02/14/2022 1000   HCT 33.0 (L) 12/06/2019 1119   PLT 170 02/14/2022 1000   PLT 199 12/06/2019 1119   MCV 94.8 02/14/2022 1000   MCV 91 12/06/2019 1119   MCV 92 08/19/2012 0208   MCH 31.4 02/14/2022 1000   MCHC 33.1 02/14/2022 1000   RDW 14.4 02/14/2022 1000   RDW 12.4 12/06/2019 1119   RDW 13.2 08/19/2012 0208   LYMPHSABS 1.8 10/18/2021 0955   LYMPHSABS 1.9 12/06/2019 1119   MONOABS 0.4 10/18/2021 0955   EOSABS 0.1 10/18/2021 0955   EOSABS 0.2 12/06/2019 1119   BASOSABS 0.1 10/18/2021 0955   BASOSABS 0.1 12/06/2019 1119      Latest Ref Rng & Units 02/22/2022    4:13 PM 02/14/2022   10:00 AM 01/21/2022   11:44 AM  CMP  Glucose 70 - 99 mg/dL 122  98  90   BUN 6 - 23 mg/dL 45  28  30   Creatinine 0.40 - 1.20 mg/dL 2.85  2.04  2.27   Sodium 135 - 145 mEq/L 138  140  137   Potassium 3.5 - 5.1 mEq/L 3.4  3.9  4.1   Chloride 96 - 112 mEq/L 100  109  103   CO2 19 - 32 mEq/L '28  24  25   '$ Calcium 8.4 -  10.5 mg/dL 9.8  9.6  9.5   Total Protein 6.0 - 8.3 g/dL 7.6     Total Bilirubin 0.2 - 1.2 mg/dL 0.8     Alkaline Phos 39 - 117 U/L 49     AST 0 - 37 U/L 23     ALT 0 - 35 U/L 23         IMAGING:  [facility] 02/14/22: no acute process  Imaging independently reviewed on March 21, 2022   Current Outpatient Medications on File Prior to Visit  Medication Sig Dispense Refill   amiodarone (PACERONE) 200 MG tablet Taking one tablet by mouth daily     apixaban (ELIQUIS) 2.5 MG TABS tablet Take 1 tablet (2.5 mg total) by mouth 2 (two) times daily. 180 tablet 3   furosemide (LASIX) 40 MG tablet Take 1 tablet (40 mg total) by mouth daily. 90 tablet 3   hydrALAZINE (APRESOLINE) 50 MG tablet Take 1 tablet (50 mg total) by mouth every 8 (eight) hours. 270 tablet 3   losartan (COZAAR) 100 MG tablet TAKE 1 TABLET BY MOUTH ONCE DAILY 90 tablet 2   pantoprazole (PROTONIX) 40 MG tablet Take 1 tablet (40 mg  total) by mouth daily. 30 tablet 11   potassium chloride SA (KLOR-CON M) 20 MEQ tablet Take 1 tablet (20 mEq total) by mouth daily. Take when taking furosemide. 30 tablet 3   promethazine (PHENERGAN) 25 MG tablet Take 1 tablet (25 mg total) by mouth every 8 (eight) hours as needed for nausea or vomiting. 20 tablet 0   rosuvastatin (CRESTOR) 10 MG tablet TAKE 1 TABLET BY MOUTH ONCE DAILY 90 tablet 2   B Complex Vitamins (VITAMIN B COMPLEX) TABS Take 1 tablet by mouth daily.  (Patient not taking: Reported on 03/21/2022)     D 1000 25 MCG (1000 UT) capsule TAKE 1 CAPSULE BY MOUTH AT BEDTIME. (Patient not taking: Reported on 03/21/2022) 30 capsule 3   No current facility-administered medications on file prior to visit.     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 cath 2/09: no CAD, EF 70%   GERD (gastroesophageal reflux disease) 07/26/2021   HYPERLIPIDEMIA    HYPERTENSION    Mild Aortic insufficiency    Moderate mitral regurgitation    Moderate tricuspid regurgitation    OSTEOPENIA    PAF (paroxysmal atrial fibrillation) ([facility])    a. s/p ablation 2013; b. CHADS2VASc -> 4 (HTN, age x 2, female)-->Eliquis.   PAT (paroxysmal atrial tachycardia) ([facility])    a. 04/2019 Zio: Occas PACs and rare PVCs. 21 atrial runs - longest 20 beats, max rate 169.   Pneumonia 2009   RA (rheumatoid arthritis) ([facility])     Sinus Bradycardia    a. asymptomatic but prevents use of AVN blocking agents; b. 04/2019 Zio: Avg HR 61 (37-109).   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 05/2019 Echo: EF 60-65%. DD. RVSP 41.6mHg. Mod dil LA. Mod MR/TR. Mild AI.    Past Surgical History Past Surgical History:  Procedure Laterality Date   ABLATION OF DYSRHYTHMIC FOCUS     CARDIAC CATHETERIZATION     COLONOSCOPY  04/18/08   Partial hysterectomy--1979     Thoracentesis   12/20/07      Social History: Social History   Tobacco Use   Smoking status: Never   Smokeless tobacco: Never  Vaping Use   Vaping Use: Never used  Substance Use Topics   Alcohol use: No    Alcohol/week: 0.0 standard drinks of alcohol   Drug use: No    ROS: Negative for fevers, chills. Positive for headaches. All other systems reviewed and negative unless stated otherwise in HPI.   Physical Exam:   Vital Signs: BP (!) 133/52   Pulse (!) 53   Ht 5' (1.524 m)   Wt 136 lb (61.7 kg)   BMI 26.56 kg/m  GENERAL: well appearing,in no acute distress,alert SKIN:  Color, texture, turgor normal. No rashes or lesions HEAD:  Normocephalic/atraumatic. CV:  RRR RESP: Normal respiratory effort MSK: no tenderness to palpation over occiput, neck, or shoulders  NEUROLOGICAL: Mental Status: Alert, oriented to person, place and time,Follows commands Cranial Nerves: PERRL, visual fields intact to confrontation, extraocular movements intact, facial sensation intact, no facial droop or ptosis, hearing grossly intact, no dysarthria Motor: muscle strength 5/5 both upper and lower extremities,no drift, normal tone Reflexes: 2+ throughout Sensation: decreased sensation to light touch LUE, otherwise sensation intact throughout Coordination: Finger-to- nose-finger intact bilaterally Gait: stooped posture, normal stride length   IMPRESSION: 81year old female with a history of aortic valve stenosis, CAD, HTN, pAfib on eliquis, HLD who  presents for evaluation of headaches, insomnia, and memory changes following a head trauma in February 2023. 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 Will order brain [name] as exam reveals decreased sensation of LUE. Referral to speech therapy placed for cognitive rehab. She would prefer not to start a medication at this time. Discussed supplement options to help with headaches and sleep (magnesium, B2, CoQ10, melatonin).  PLAN: -[name] brain without contrast -Referral to Speech Therapy for cognitive rehab -Supplement information for headaches and sleep provided    I spent a total of 34 minutes chart reviewing and counseling the patient. Headache education was done. Discussed treatment options including natural supplements. Written educational materials and patient instructions outlining all of the above were given.  Follow-up: 5 months  [id number] Harold MD 03/21/2022   11:01 AM

## 2022-03-22 ENCOUNTER — Encounter: Payer: Self-pay | Admitting: Intensive Care

## 2022-03-22 ENCOUNTER — Emergency Department: Payer: Medicare Other

## 2022-03-22 ENCOUNTER — Inpatient Hospital Stay
Admission: EM | Admit: 2022-03-22 | Discharge: 2022-03-24 | DRG: 641 | Disposition: A | Discharge status: Home / Self Care | Payer: Medicare Other | Attending: Internal Medicine | Admitting: Internal Medicine

## 2022-03-22 ENCOUNTER — Inpatient Hospital Stay: Payer: Medicare Other

## 2022-03-22 ENCOUNTER — Other Ambulatory Visit: Payer: Self-pay

## 2022-03-22 ENCOUNTER — Telehealth: Payer: Self-pay | Admitting: Psychiatry

## 2022-03-22 DIAGNOSIS — I083 Combined rheumatic disorders of mitral, aortic and tricuspid valves: Secondary | ICD-10-CM | POA: Diagnosis not present

## 2022-03-22 DIAGNOSIS — R001 Bradycardia, unspecified: Secondary | ICD-10-CM | POA: Diagnosis present

## 2022-03-22 DIAGNOSIS — H409 Unspecified glaucoma: Secondary | ICD-10-CM | POA: Diagnosis present

## 2022-03-22 DIAGNOSIS — N1832 Chronic kidney disease, stage 3b: Secondary | ICD-10-CM | POA: Diagnosis present

## 2022-03-22 DIAGNOSIS — K219 Gastro-esophageal reflux disease without esophagitis: Secondary | ICD-10-CM | POA: Diagnosis not present

## 2022-03-22 DIAGNOSIS — I358 Other nonrheumatic aortic valve disorders: Secondary | ICD-10-CM | POA: Diagnosis present

## 2022-03-22 DIAGNOSIS — R55 Syncope and collapse: Secondary | ICD-10-CM | POA: Diagnosis not present

## 2022-03-22 DIAGNOSIS — I10 Essential (primary) hypertension: Secondary | ICD-10-CM | POA: Diagnosis not present

## 2022-03-22 DIAGNOSIS — E1122 Type 2 diabetes mellitus with diabetic chronic kidney disease: Secondary | ICD-10-CM | POA: Diagnosis present

## 2022-03-22 DIAGNOSIS — M069 Rheumatoid arthritis, unspecified: Secondary | ICD-10-CM | POA: Diagnosis present

## 2022-03-22 DIAGNOSIS — R54 Age-related physical debility: Secondary | ICD-10-CM | POA: Diagnosis not present

## 2022-03-22 DIAGNOSIS — I480 Paroxysmal atrial fibrillation: Secondary | ICD-10-CM | POA: Diagnosis not present

## 2022-03-22 DIAGNOSIS — R112 Nausea with vomiting, unspecified: Secondary | ICD-10-CM | POA: Diagnosis not present

## 2022-03-22 DIAGNOSIS — I495 Sick sinus syndrome: Secondary | ICD-10-CM | POA: Diagnosis present

## 2022-03-22 DIAGNOSIS — Z79899 Other long term (current) drug therapy: Secondary | ICD-10-CM | POA: Diagnosis not present

## 2022-03-22 DIAGNOSIS — Z7901 Long term (current) use of anticoagulants: Secondary | ICD-10-CM | POA: Diagnosis not present

## 2022-03-22 DIAGNOSIS — I129 Hypertensive chronic kidney disease with stage 1 through stage 4 chronic kidney disease, or unspecified chronic kidney disease: Secondary | ICD-10-CM | POA: Diagnosis not present

## 2022-03-22 DIAGNOSIS — I700 Atherosclerosis of aorta: Secondary | ICD-10-CM | POA: Diagnosis not present

## 2022-03-22 DIAGNOSIS — N189 Chronic kidney disease, unspecified: Secondary | ICD-10-CM | POA: Diagnosis present

## 2022-03-22 DIAGNOSIS — R109 Unspecified abdominal pain: Secondary | ICD-10-CM | POA: Diagnosis not present

## 2022-03-22 DIAGNOSIS — Z833 Family history of diabetes mellitus: Secondary | ICD-10-CM

## 2022-03-22 DIAGNOSIS — K5900 Constipation, unspecified: Secondary | ICD-10-CM | POA: Diagnosis present

## 2022-03-22 DIAGNOSIS — I2510 Atherosclerotic heart disease of native coronary artery without angina pectoris: Secondary | ICD-10-CM | POA: Diagnosis not present

## 2022-03-22 DIAGNOSIS — E785 Hyperlipidemia, unspecified: Secondary | ICD-10-CM | POA: Diagnosis not present

## 2022-03-22 DIAGNOSIS — S0990XA Unspecified injury of head, initial encounter: Secondary | ICD-10-CM | POA: Diagnosis not present

## 2022-03-22 DIAGNOSIS — D631 Anemia in chronic kidney disease: Secondary | ICD-10-CM | POA: Diagnosis not present

## 2022-03-22 DIAGNOSIS — Z888 Allergy status to other drugs, medicaments and biological substances status: Secondary | ICD-10-CM

## 2022-03-22 DIAGNOSIS — Z8249 Family history of ischemic heart disease and other diseases of the circulatory system: Secondary | ICD-10-CM

## 2022-03-22 DIAGNOSIS — N179 Acute kidney failure, unspecified: Secondary | ICD-10-CM | POA: Diagnosis not present

## 2022-03-22 DIAGNOSIS — E860 Dehydration: Principal | ICD-10-CM | POA: Diagnosis present

## 2022-03-22 DIAGNOSIS — E782 Mixed hyperlipidemia: Secondary | ICD-10-CM | POA: Diagnosis present

## 2022-03-22 DIAGNOSIS — R110 Nausea: Secondary | ICD-10-CM

## 2022-03-22 DIAGNOSIS — Z886 Allergy status to analgesic agent status: Secondary | ICD-10-CM

## 2022-03-22 DIAGNOSIS — D638 Anemia in other chronic diseases classified elsewhere: Secondary | ICD-10-CM | POA: Diagnosis present

## 2022-03-22 DIAGNOSIS — I4819 Other persistent atrial fibrillation: Secondary | ICD-10-CM | POA: Diagnosis not present

## 2022-03-22 DIAGNOSIS — Z9889 Other specified postprocedural states: Secondary | ICD-10-CM

## 2022-03-22 LAB — CBC
HCT: 31.7 % — ABNORMAL LOW (ref 36.0–46.0)
Hemoglobin: 10.2 g/dL — ABNORMAL LOW (ref 12.0–15.0)
MCH: 30.3 pg (ref 26.0–34.0)
MCHC: 32.2 g/dL (ref 30.0–36.0)
MCV: 94.1 fL (ref 80.0–100.0)
Platelets: 157 10*3/uL (ref 150–400)
RBC: 3.37 MIL/uL — ABNORMAL LOW (ref 3.87–5.11)
RDW: 13.6 % (ref 11.5–15.5)
WBC: 5.2 10*3/uL (ref 4.0–10.5)
nRBC: 0 % (ref 0.0–0.2)

## 2022-03-22 LAB — COMPREHENSIVE METABOLIC PANEL
ALT: 21 U/L (ref 0–44)
AST: 28 U/L (ref 15–41)
Albumin: 4.2 g/dL (ref 3.5–5.0)
Alkaline Phosphatase: 50 U/L (ref 38–126)
Anion gap: 9 (ref 5–15)
BUN: 66 mg/dL — ABNORMAL HIGH (ref 8–23)
CO2: 26 mmol/L (ref 22–32)
Calcium: 9.4 mg/dL (ref 8.9–10.3)
Chloride: 102 mmol/L (ref 98–111)
Creatinine, Ser: 4.29 mg/dL — ABNORMAL HIGH (ref 0.44–1.00)
GFR, Estimated: 10 mL/min — ABNORMAL LOW (ref >60)
Glucose, Bld: 136 mg/dL — ABNORMAL HIGH (ref 70–99)
Potassium: 4.3 mmol/L (ref 3.5–5.1)
Sodium: 137 mmol/L (ref 135–145)
Total Bilirubin: 0.9 mg/dL (ref 0.3–1.2)
Total Protein: 7.8 g/dL (ref 6.5–8.1)

## 2022-03-22 LAB — LIPASE, BLOOD: Lipase: 55 U/L — ABNORMAL HIGH (ref 11–51)

## 2022-03-22 LAB — TROPONIN I (HIGH SENSITIVITY)
Troponin I (High Sensitivity): 6 ng/L (ref <18)
Troponin I (High Sensitivity): 5 ng/L (ref <18)

## 2022-03-22 LAB — LACTIC ACID, PLASMA: Lactic Acid, Venous: 0.7 mmol/L (ref 0.5–1.9)

## 2022-03-22 IMAGING — CT CT ABD-PELV W/O CM
2 of 4 series · 16 of 46 positions shown, 18 images · non-contrast
Comparison: None Available.

CLINICAL DATA: Abdominal pain, acute, nonlocalized.  Nausea.



[Series 2: routine abd/pel wo · axial · 0.62mm/px · z∈[-819,-454]mm · 13 of 81 slices shown, 15 images]
[im 4/81  soft-tissue]
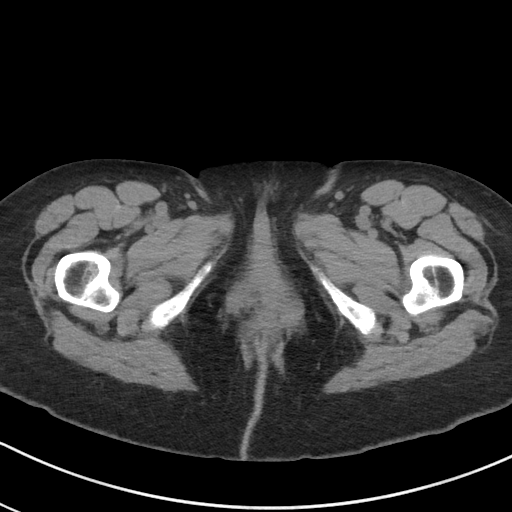
[im 4/81  bone]
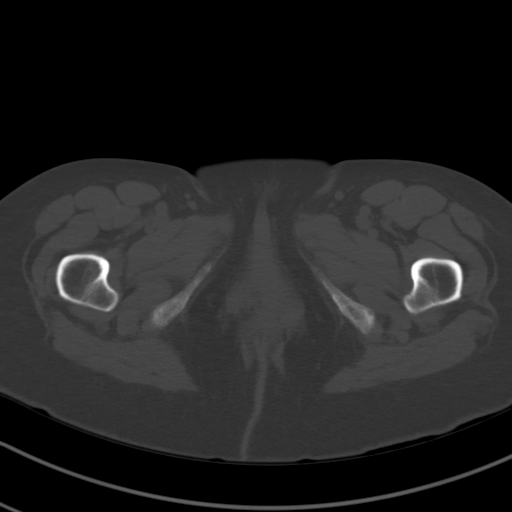
[im 11/81  soft-tissue]
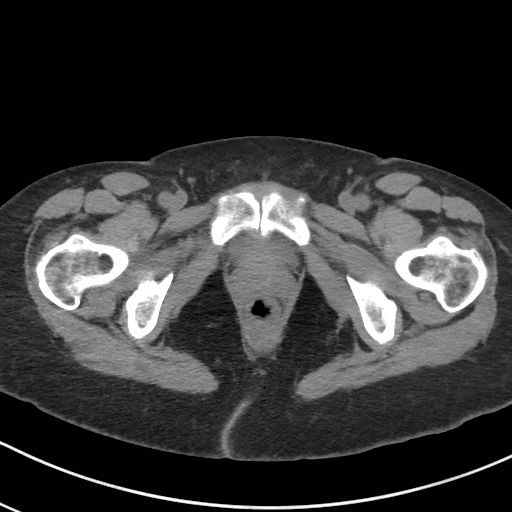
[im 19/81  soft-tissue]
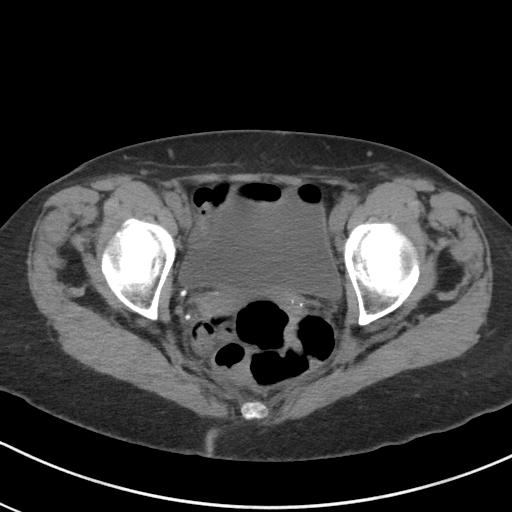
[im 22/81  soft-tissue]
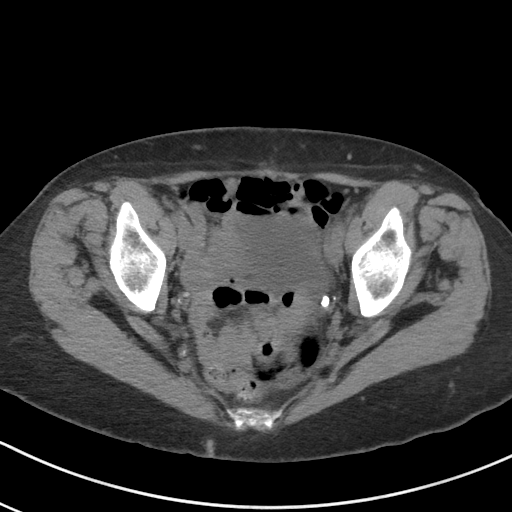
[im 30/81  soft-tissue]
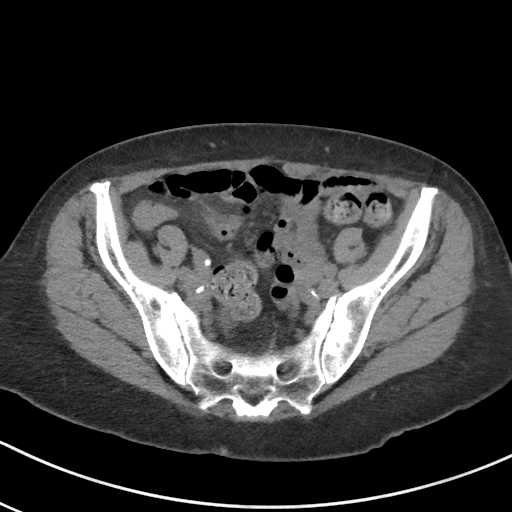
[im 33/81  soft-tissue]
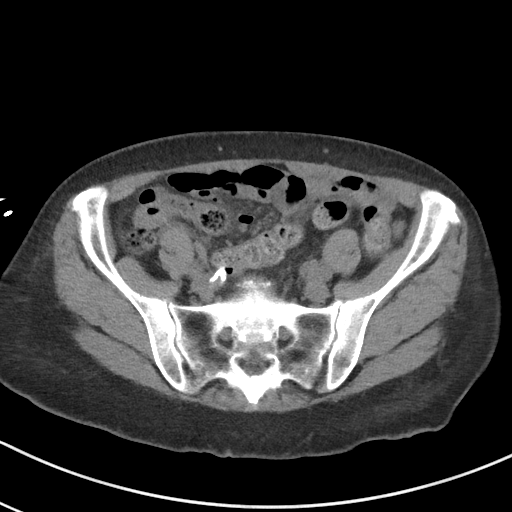
[im 41/81  soft-tissue]
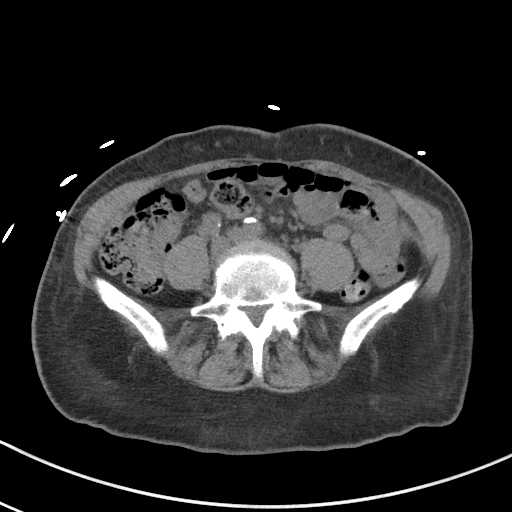
[im 48/81  soft-tissue]
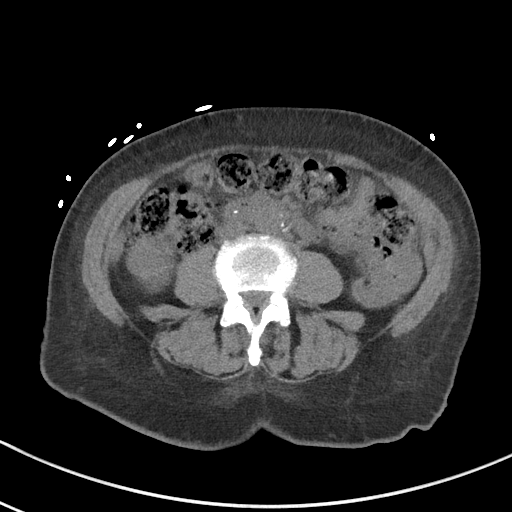
[im 51/81  soft-tissue]
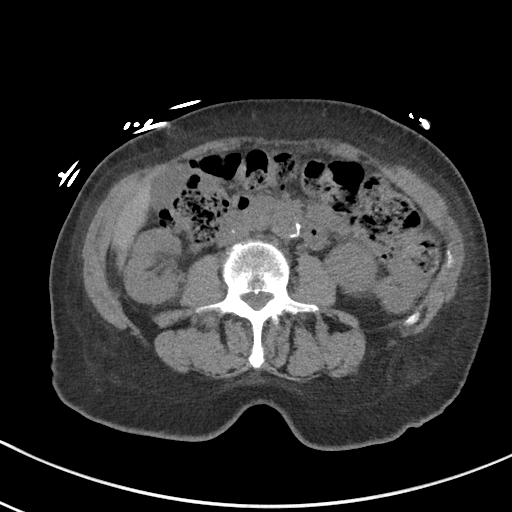
[im 51/81  bone]
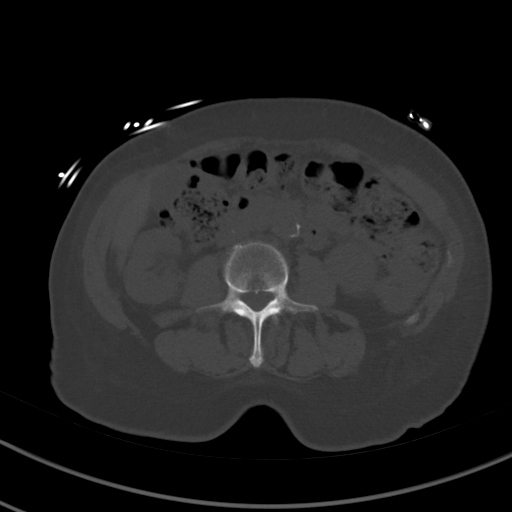
[im 59/81  soft-tissue]
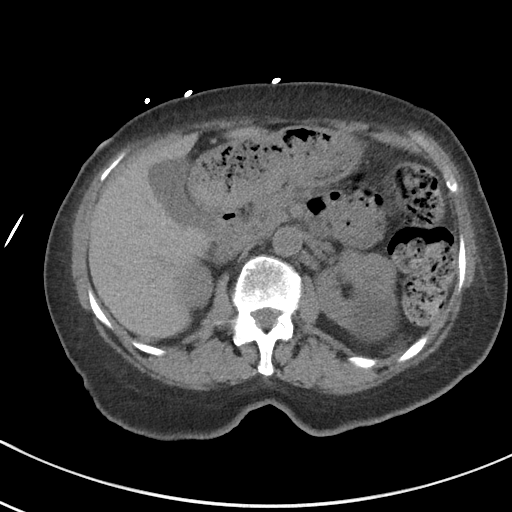
[im 62/81  soft-tissue]
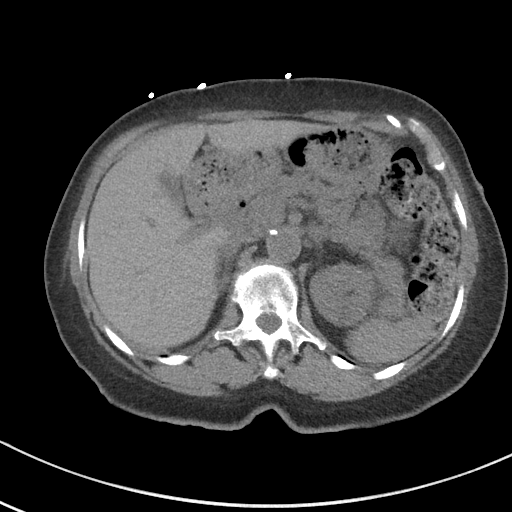
[im 70/81  soft-tissue]
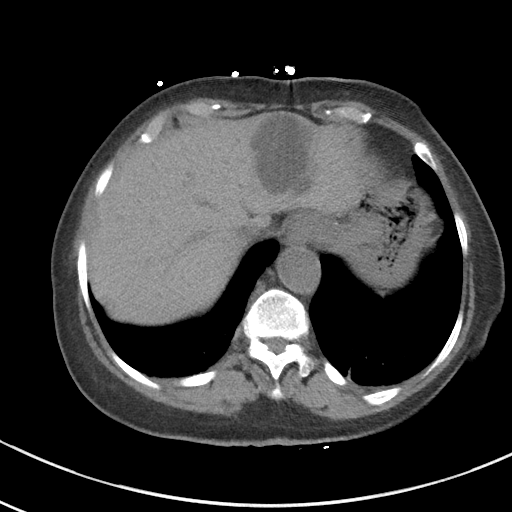
[im 77/81  soft-tissue]
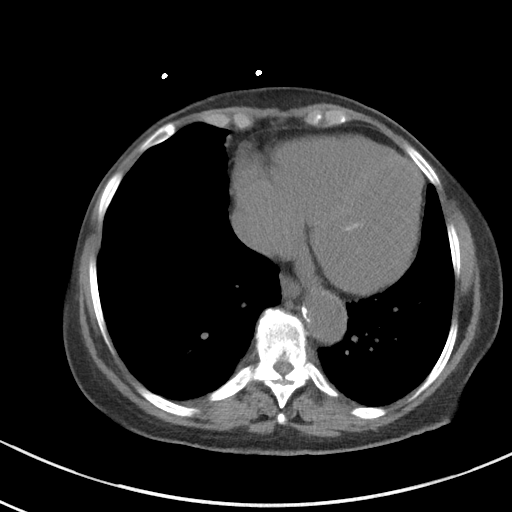

[Series 5: coronal st · coronal · 0.68mm/px · 3 of 75 slices shown]
[im 25/75  soft-tissue]
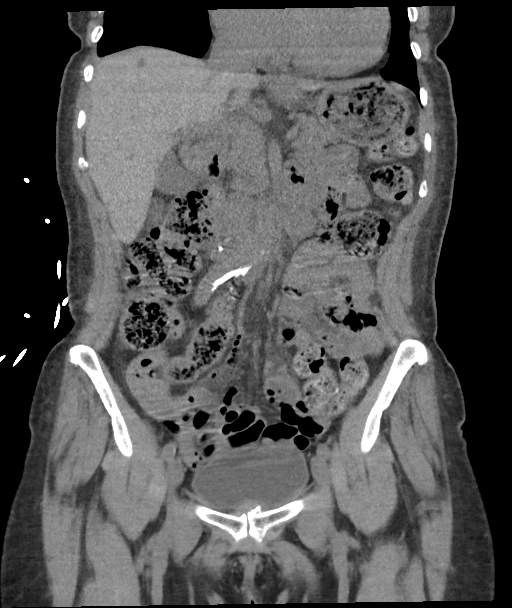
[im 33/75  soft-tissue]
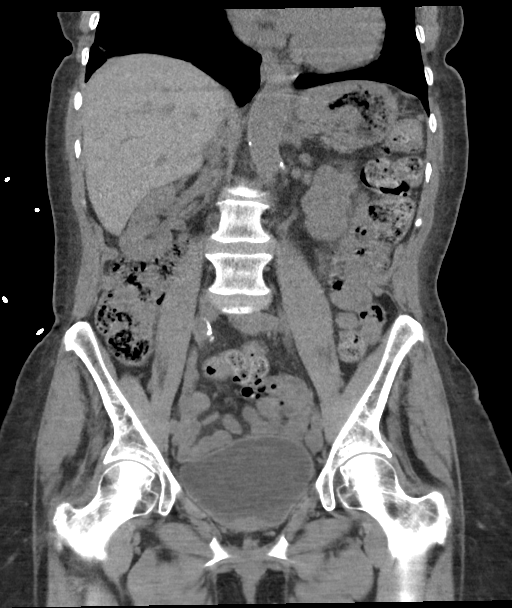
[im 42/75  soft-tissue]
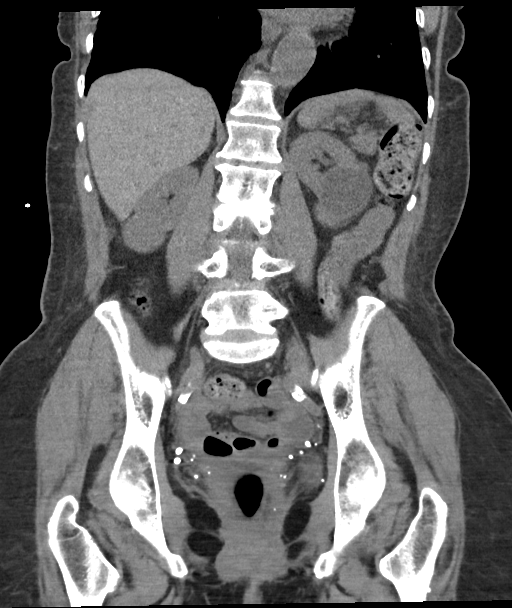

[16 of 46 positions shown; findings below may reference images not displayed]

FINDINGS: Lower chest: No acute abnormality

Hepatobiliary: Large cyst in the left hepatic lobe measures 5.2 cm.
Smaller scattered subcentimeter cysts throughout the liver.
Gallbladder unremarkable.

Pancreas: No focal abnormality or ductal dilatation.

Spleen: No focal abnormality.  Normal size.

Adrenals/Urinary Tract: 4.2 cm left lower pole renal cyst. No stones
or hydronephrosis bilaterally. Adrenal glands and urinary bladder
unremarkable.

Stomach/Bowel: Moderate stool burden throughout the colon. Normal
appendix. Stomach, large and small bowel grossly unremarkable.

Vascular/Lymphatic: Aortic atherosclerosis. No evidence of aneurysm
or adenopathy.

Reproductive: Prior hysterectomy.  No adnexal masses.

Other: No free fluid or free air.

Musculoskeletal: No acute bony abnormality.
IMPRESSION: No acute findings in the abdomen or pelvis.

Hepatic and left renal cysts.

Aortic atherosclerosis.

Moderate stool burden.

## 2022-03-22 IMAGING — MR MR MRA NECK W/O CM
1 of 2 series · 33 of 48 positions shown · non-contrast
Comparison: None Available.

CLINICAL DATA: Initial evaluation for acute syncope/presyncope.

EXAM:
MRA NECK WITHOUT CONTRAST
TECHNIQUE: Angiographic images of the neck were acquired using MRA technique
without intravenous contrast. Carotid stenosis measurements (when
applicable) are obtained utilizing NASCET criteria, using the distal
internal carotid diameter as the denominator.

[Series 9: TOF · axial · 0.5mm · 0.54mm/px · z∈[-217,-77]mm · 33 of 298 slices shown]
[im 1/298]
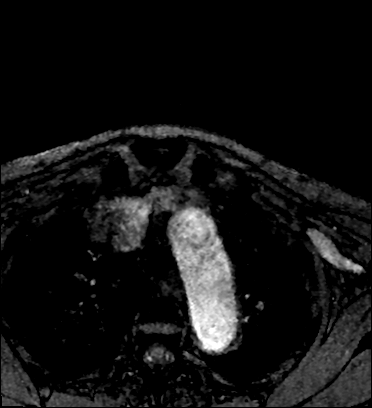
[im 10/298]
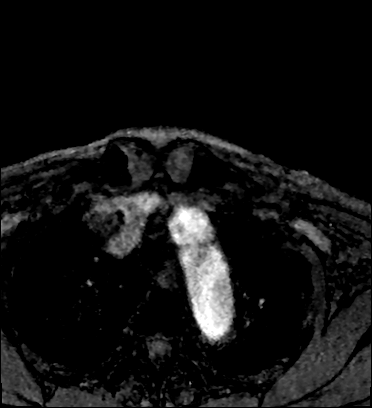
[im 19/298]
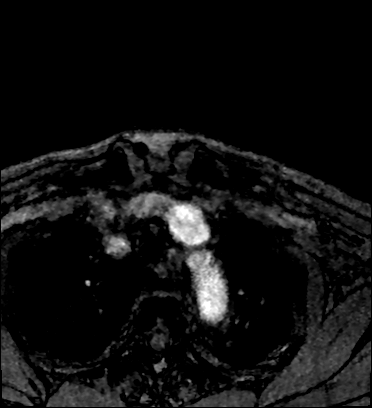
[im 28/298]
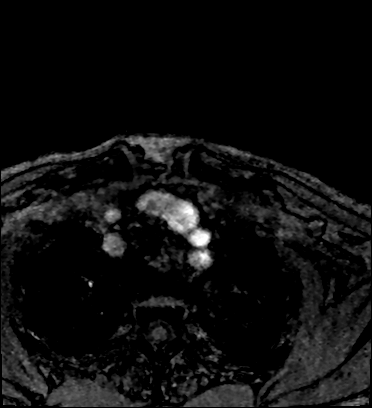
[im 38/298]
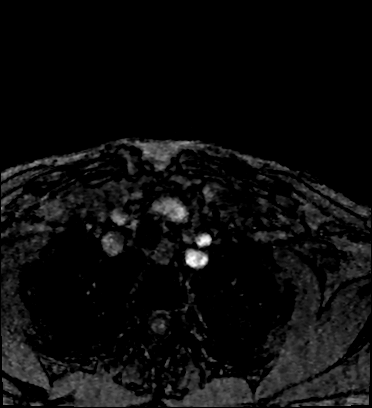
[im 47/298]
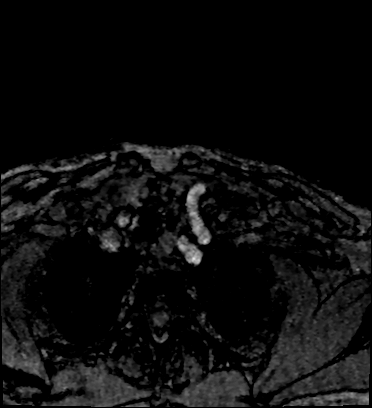
[im 56/298]
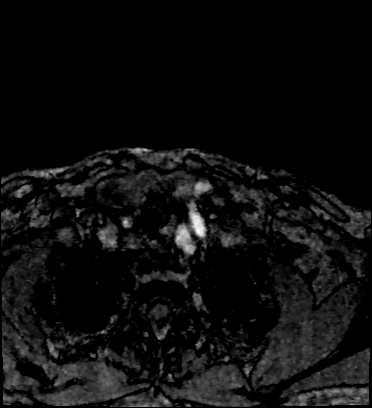
[im 65/298]
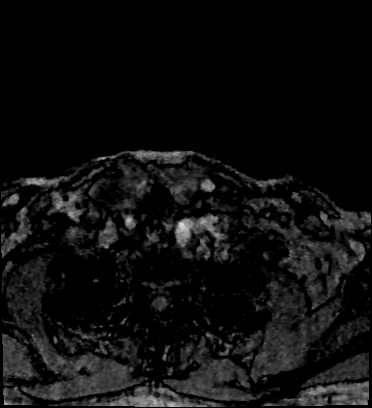
[im 75/298]
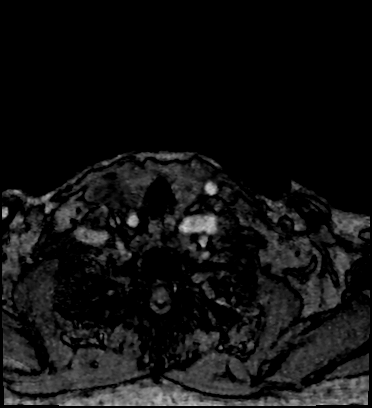
[im 84/298]
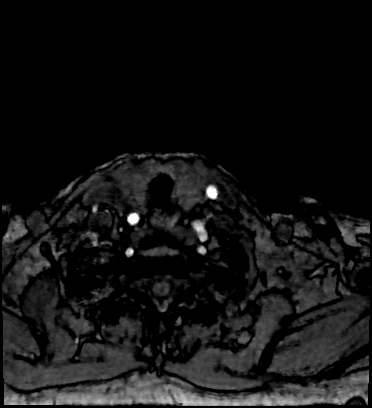
[im 93/298]
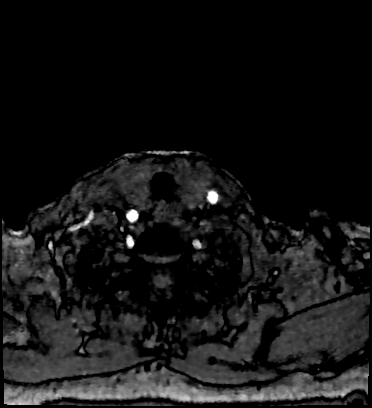
[im 103/298]
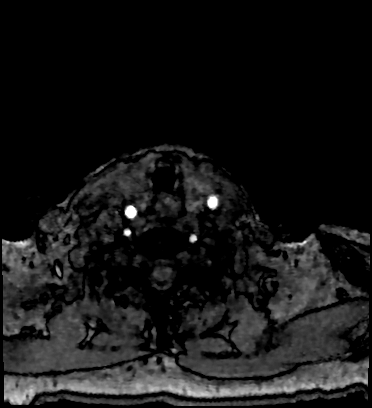
[im 112/298]
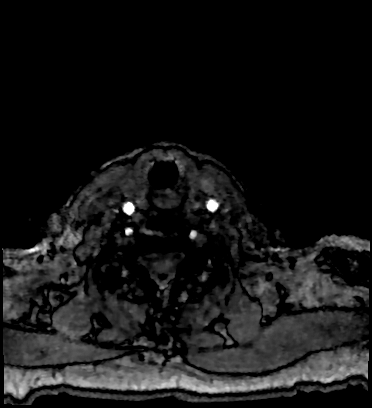
[im 121/298]
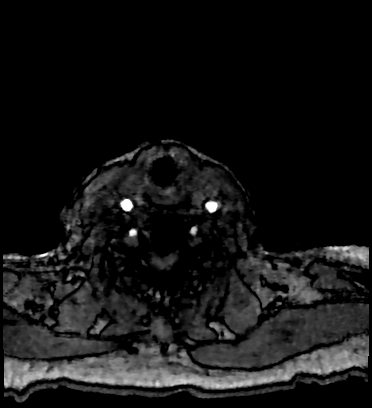
[im 130/298]
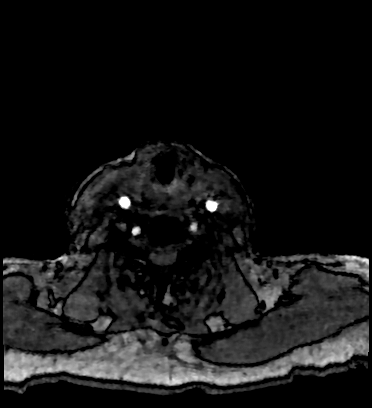
[im 140/298]
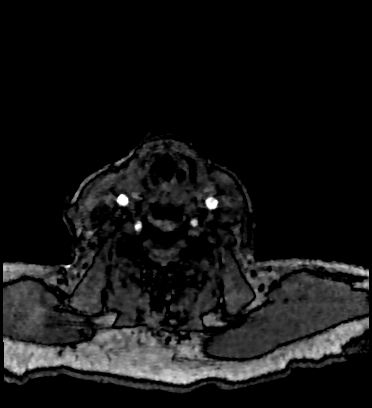
[im 149/298]
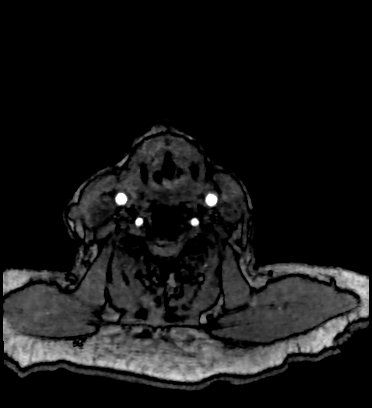
[im 158/298]
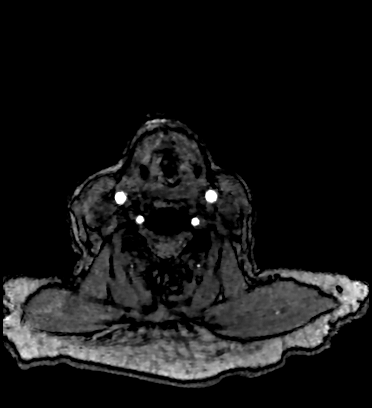
[im 168/298]
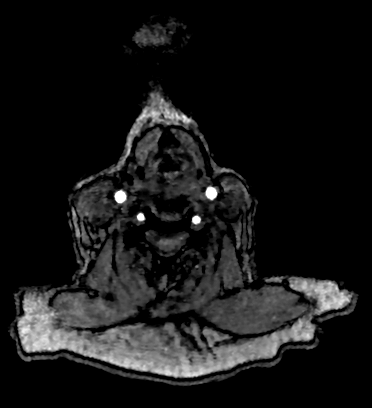
[im 177/298]
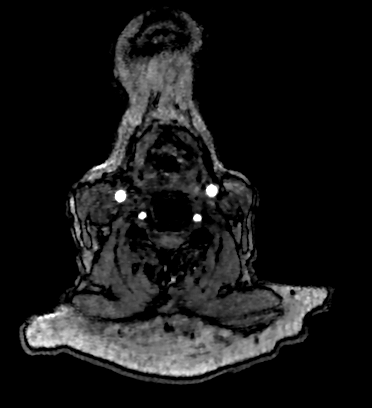
[im 186/298]
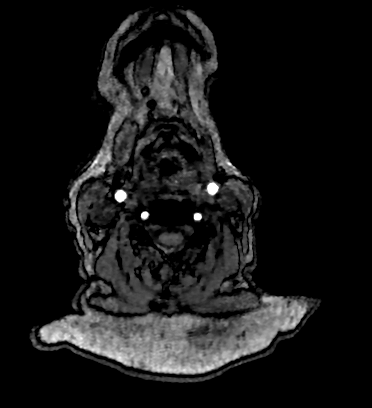
[im 195/298]
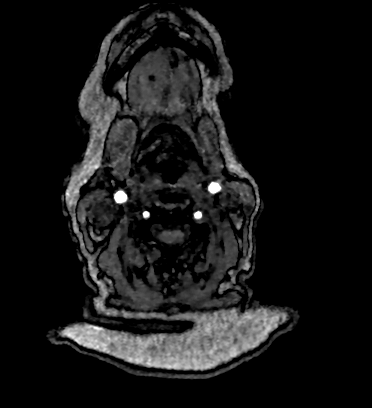
[im 205/298]
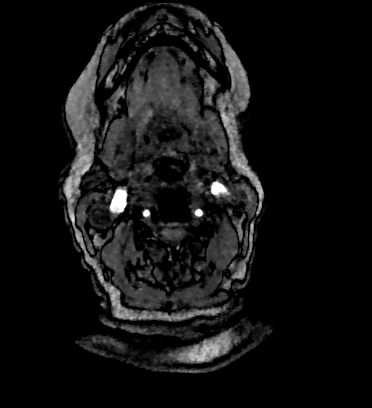
[im 214/298]
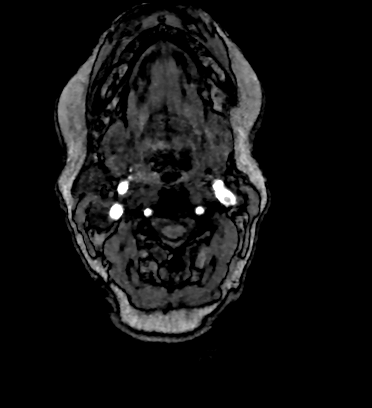
[im 223/298]
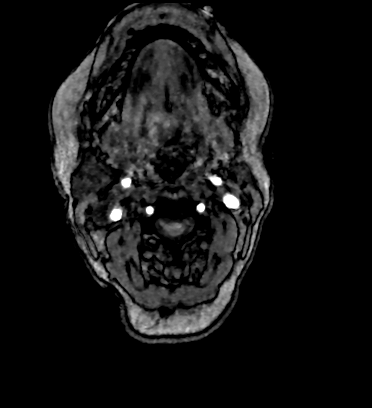
[im 233/298]
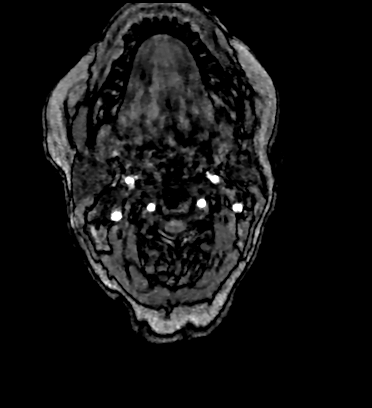
[im 242/298]
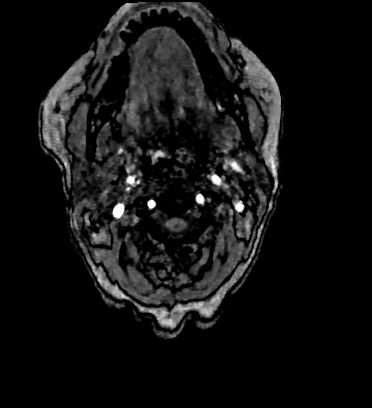
[im 251/298]
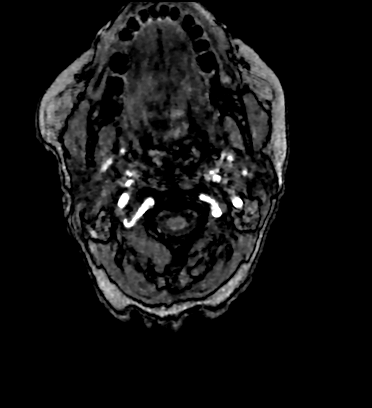
[im 260/298]
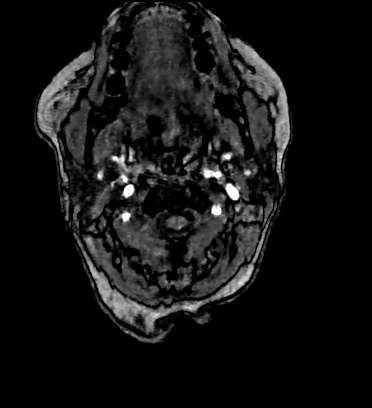
[im 270/298]
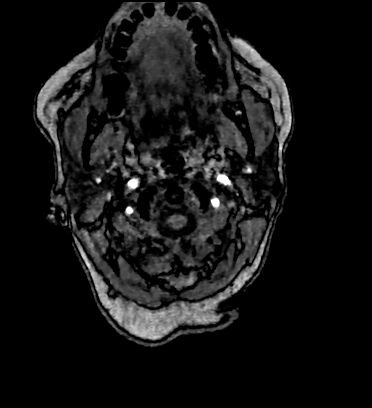
[im 279/298]
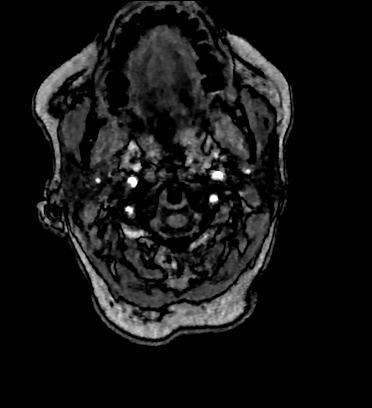
[im 288/298]
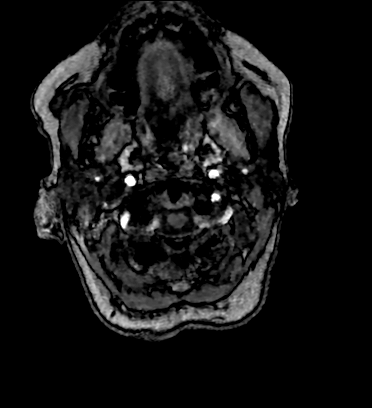
[im 298/298]
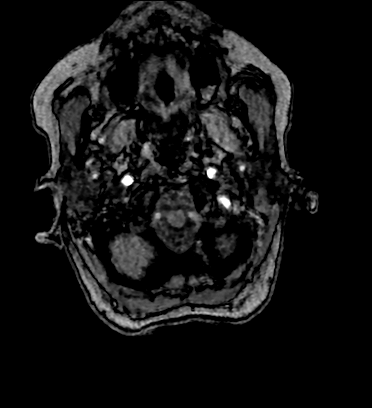

[33 of 48 positions shown; findings below may reference images not displayed]

FINDINGS: Aortic arch: Visualized aortic arch normal caliber with standard
branching pattern. No stenosis about the origin of the great
vessels.

Right carotid system: Right common and internal carotid arteries
patent without stenosis or evidence for dissection.

Left carotid system: Left common and internal carotid arteries
patent without stenosis or evidence for dissection.

Vertebral arteries: Both vertebral arteries arise from the
subclavian arteries. No visible proximal subclavian artery stenosis.
Vertebral arteries co dominant and patent without stenosis or
dissection.

Other: None.
IMPRESSION: Normal MRA of the neck.

## 2022-03-22 IMAGING — MR MR HEAD W/O CM
11 series · 46 of 48 positions shown · non-contrast
Comparison: CT from [DATE].

CLINICAL DATA: Initial evaluation for acute syncope/presyncope.

EXAM:
[name] HEAD WITHOUT CONTRAST
TECHNIQUE: Multiplanar, multiecho pulse sequences of the brain and surrounding
structures were obtained without intravenous contrast.

[Series 5: ax dwi_tracew · axial · 3.0mm · 0.65mm/px · z∈[-81,+74]mm · 4 of 48 slices shown]
[im 1/48]
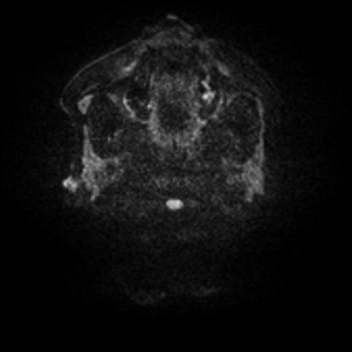
[im 16/48]
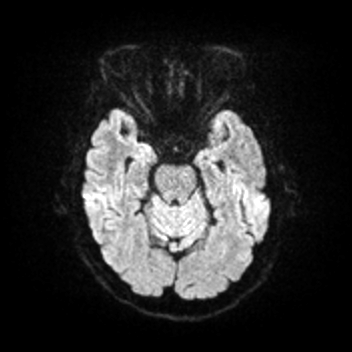
[im 32/48]
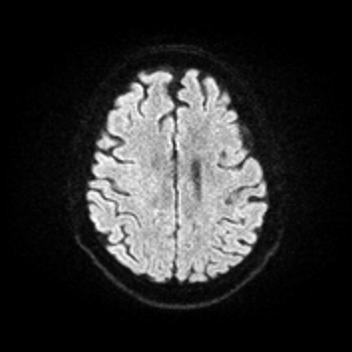
[im 48/48]
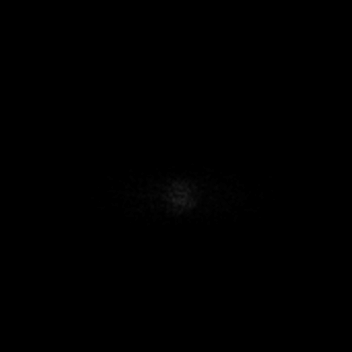

[Series 6: ax dwi_adc · axial · 3.0mm · 0.65mm/px · z∈[-81,+71]mm · 4 of 47 slices shown]
[im 1/47]
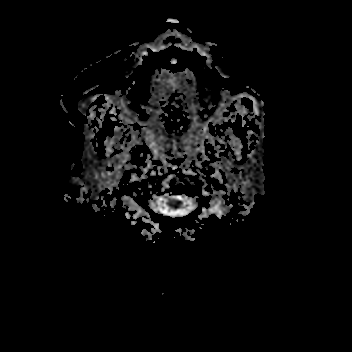
[im 16/47]
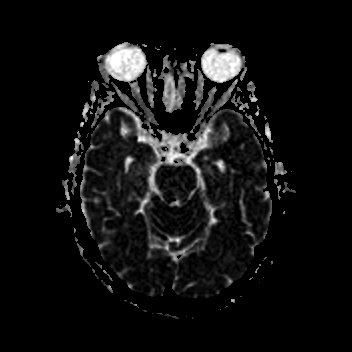
[im 31/47]
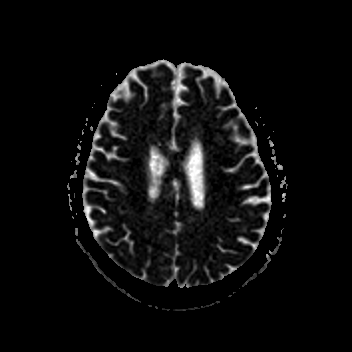
[im 47/47]
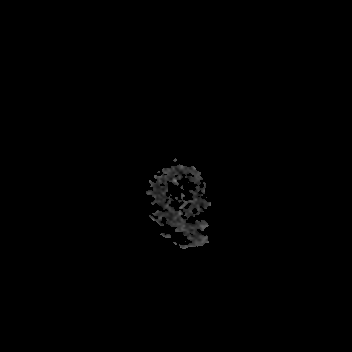

[Series 7: cor dwi_tracew · coronal · 5.0mm · 0.65mm/px · 3 of 34 slices shown]
[im 1/34]
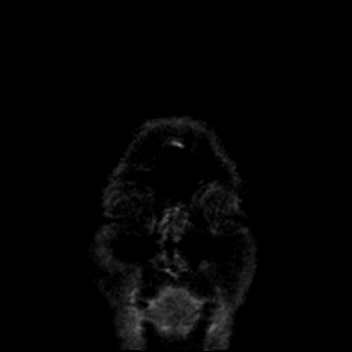
[im 17/34]
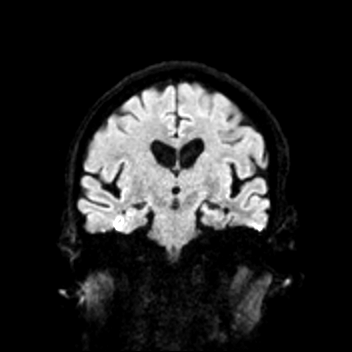
[im 34/34]
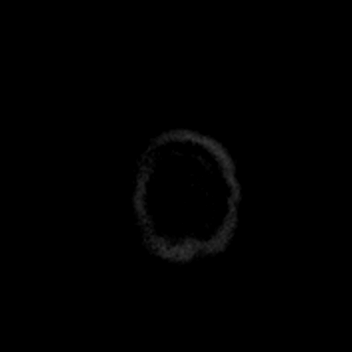

[Series 8: cor dwi_adc · coronal · 5.0mm · 0.65mm/px · 3 of 34 slices shown]
[im 1/34]
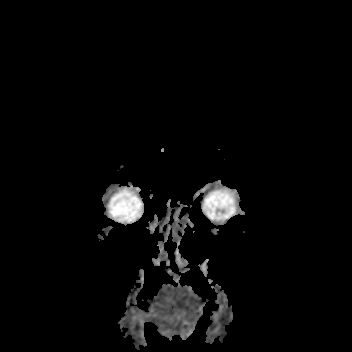
[im 17/34]
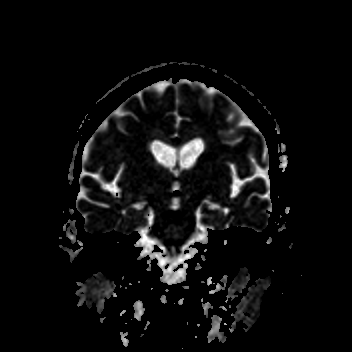
[im 34/34]
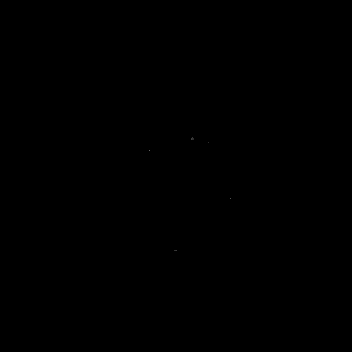

[Series 14: T1 · sagittal · 5.0mm · 0.62mm/px · 2 of 21 slices shown (1 of 2)]
[im 1/21]
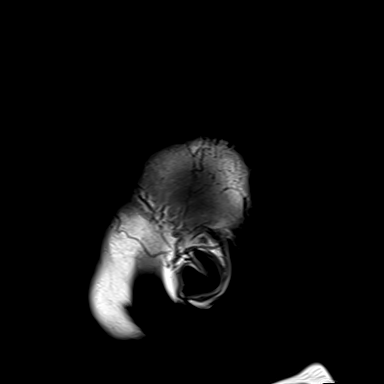
[im 21/21]
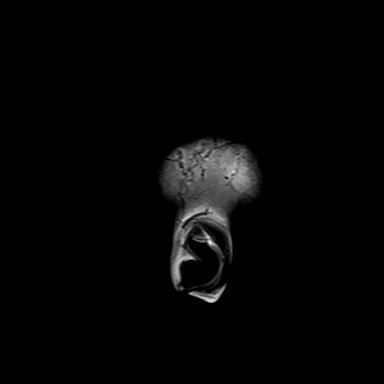

[Series 15: T2 · axial · 5.0mm · 0.53mm/px · z∈[-75,+68]mm · 2 of 25 slices shown (1 of 2)]
[im 1/25]
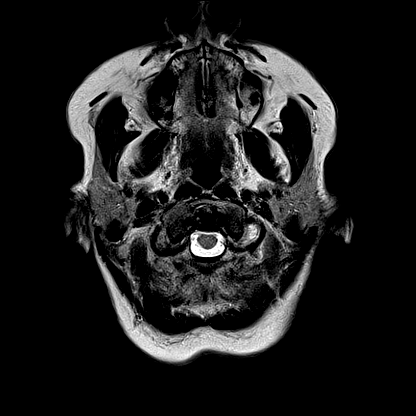
[im 25/25]
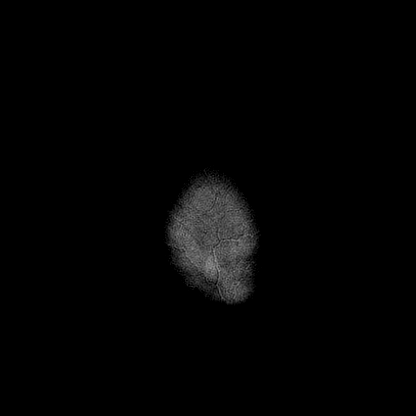

[Series 17: pha_images · axial · 3.0mm · 0.90mm/px · z∈[-91,+82]mm · 5 of 59 slices shown]
[im 1/59]
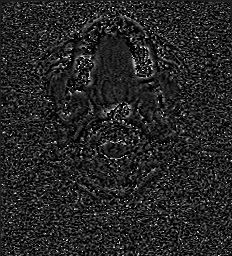
[im 15/59]
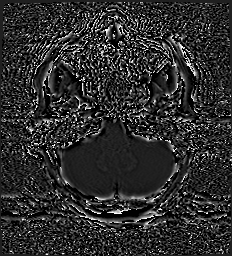
[im 30/59]
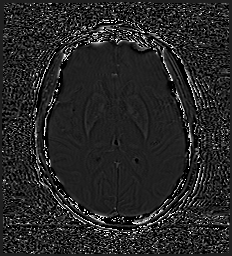
[im 44/59]
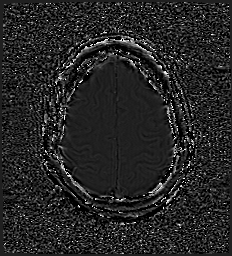
[im 59/59]
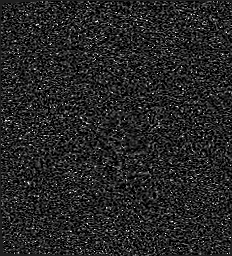

[Series 18: swi_images · axial · 3.0mm · 0.90mm/px · z∈[-91,+85]mm · 5 of 60 slices shown]
[im 1/60]
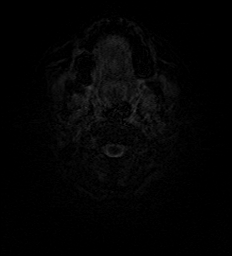
[im 15/60]
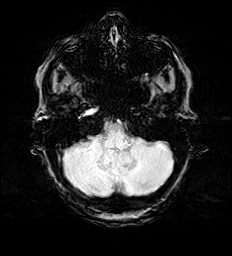
[im 30/60]
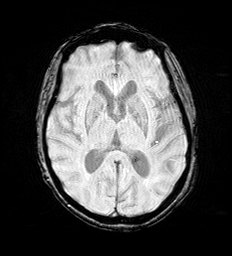
[im 45/60]
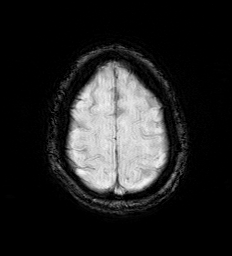
[im 60/60]
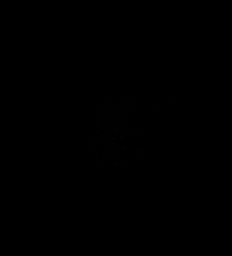

[Series 20: FLAIR · axial · 3.0mm · 0.53mm/px · z∈[-84,+77]mm · 4 of 55 slices shown]
[im 1/55]
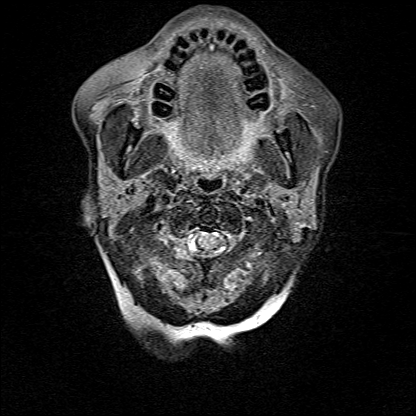
[im 19/55]
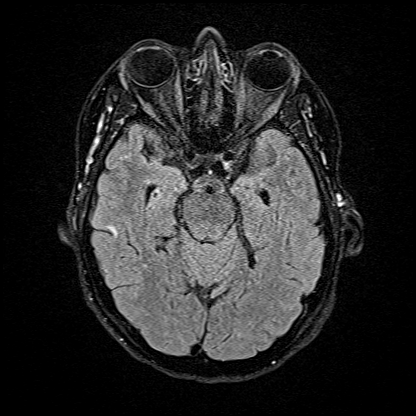
[im 37/55]
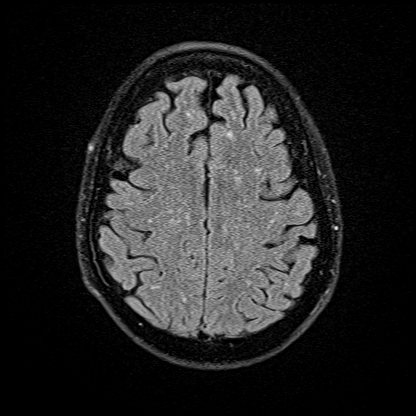
[im 55/55]
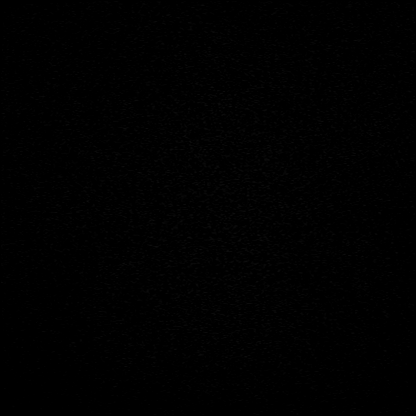

[Series 21: T1 · axial · 1.0mm · 0.98mm/px · z∈[-90,+85]mm · 12 of 176 slices shown (2 of 2)]
[im 1/176]
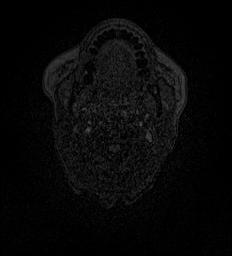
[im 14/176]
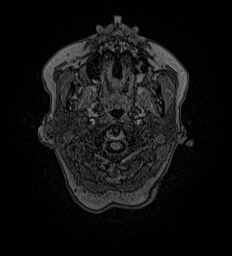
[im 27/176]
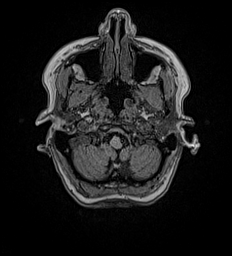
[im 41/176]
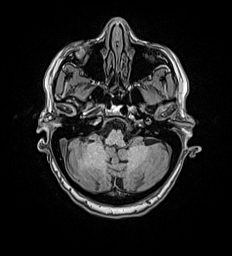
[im 54/176]
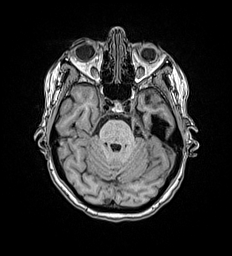
[im 68/176]
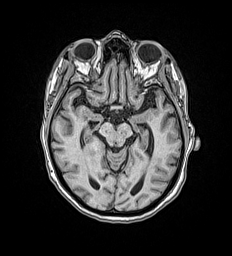
[im 81/176]
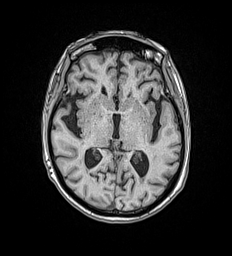
[im 95/176]
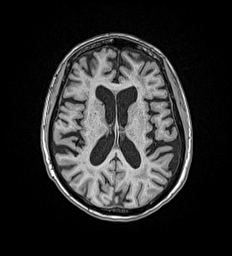
[im 108/176]
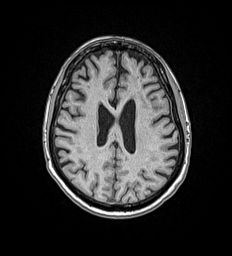
[im 122/176]
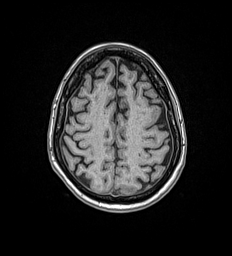
[im 149/176]
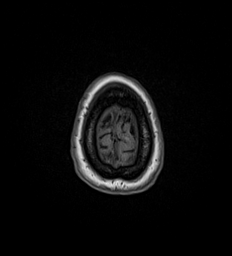
[im 176/176]
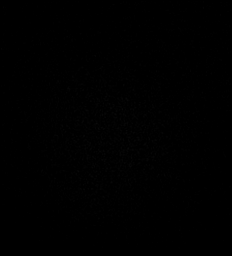

[Series 22: T2 · coronal · 5.0mm · 0.57mm/px · 2 of 29 slices shown (2 of 2)]
[im 1/29]
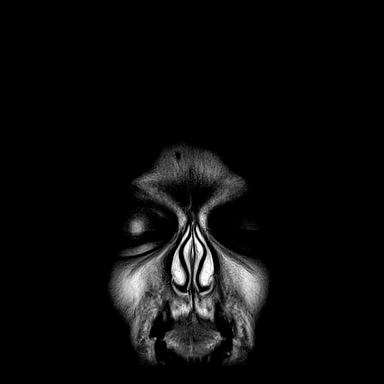
[im 29/29]
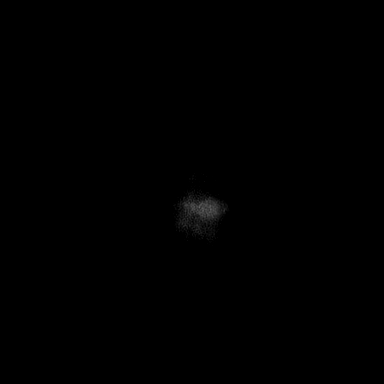

[46 of 48 positions shown; findings below may reference images not displayed]

FINDINGS: Brain: Cerebral volume within normal limits for age. Scattered
patchy T2/FLAIR hyperintensity involving the periventricular and
deep white matter both cerebral hemispheres, most consistent with
chronic small vessel ischemic disease, moderate in nature.

No evidence for acute or subacute infarct. Gray-white matter
differentiation maintained. No encephalomalacia to suggest chronic
cortical infarction. No acute or chronic intracranial blood
products.

No mass lesion, midline shift or mass effect. No hydrocephalus or
extra-axial fluid collection. Pituitary gland and suprasellar region
within normal limits.

Vascular: Major intracranial vascular flow voids are maintained.

Skull and upper cervical spine: Craniocervical junction within
normal limits. Bone marrow signal intensity normal. No scalp soft
tissue abnormality.

Sinuses/Orbits: Patient status post ocular lens replacement on the
right. Paranasal sinuses are clear. No significant mastoid effusion.

Other: None.
IMPRESSION: 1. No acute intracranial abnormality.
2. Patchy T2/FLAIR signal abnormality involving the supratentorial
cerebral white matter, most consistent with chronic small vessel
ischemic disease. Appearance is moderately advanced in nature.

## 2022-03-22 MED ORDER — ROSUVASTATIN CALCIUM 20 MG PO TABS
10.0000 mg | ORAL_TABLET | Freq: Every day | ORAL | Status: DC
  Administered 2022-03-23 – 2022-03-24 (×2): 10 mg via ORAL
  Filled 2022-03-22 (×2): qty 1

## 2022-03-22 MED ORDER — ACETAMINOPHEN 650 MG RE SUPP: 650.0000 mg | Freq: Four times a day (QID) | RECTAL | Status: DC | PRN

## 2022-03-22 MED ORDER — ACETAMINOPHEN 325 MG PO TABS: 650.0000 mg | ORAL_TABLET | Freq: Four times a day (QID) | ORAL | Status: DC | PRN

## 2022-03-22 MED ORDER — ONDANSETRON HCL 4 MG/2ML IJ SOLN
4.0000 mg | Freq: Once | INTRAMUSCULAR | Status: AC
  Administered 2022-03-22: 4 mg via INTRAVENOUS
  Filled 2022-03-22: qty 2

## 2022-03-22 MED ORDER — LACTATED RINGERS IV SOLN: INTRAVENOUS | Status: AC

## 2022-03-22 MED ORDER — PANTOPRAZOLE SODIUM 40 MG PO TBEC
40.0000 mg | DELAYED_RELEASE_TABLET | Freq: Every day | ORAL | Status: DC
  Administered 2022-03-23 – 2022-03-24 (×2): 40 mg via ORAL
  Filled 2022-03-22 (×2): qty 1

## 2022-03-22 MED ORDER — SODIUM CHLORIDE 0.9% FLUSH
3.0000 mL | Freq: Two times a day (BID) | INTRAVENOUS | Status: DC
  Administered 2022-03-23 – 2022-03-24 (×3): 3 mL via INTRAVENOUS

## 2022-03-22 MED ORDER — ONDANSETRON 4 MG PO TBDP
4.0000 mg | ORAL_TABLET | Freq: Once | ORAL | Status: AC
  Administered 2022-03-22: 4 mg via ORAL
  Filled 2022-03-22: qty 1

## 2022-03-22 MED ORDER — PROMETHAZINE HCL 25 MG PO TABS: 25.0000 mg | ORAL_TABLET | Freq: Three times a day (TID) | ORAL | Status: DC | PRN

## 2022-03-22 MED ORDER — HYDRALAZINE HCL 50 MG PO TABS
50.0000 mg | ORAL_TABLET | Freq: Three times a day (TID) | ORAL | Status: DC
  Administered 2022-03-23 – 2022-03-24 (×2): 50 mg via ORAL
  Filled 2022-03-22 (×2): qty 1

## 2022-03-22 MED ORDER — APIXABAN 2.5 MG PO TABS
2.5000 mg | ORAL_TABLET | Freq: Two times a day (BID) | ORAL | Status: DC
  Administered 2022-03-23 – 2022-03-24 (×4): 2.5 mg via ORAL
  Filled 2022-03-22 (×4): qty 1

## 2022-03-22 MED ORDER — AMIODARONE HCL 200 MG PO TABS
100.0000 mg | ORAL_TABLET | Freq: Every day | ORAL | Status: DC
  Administered 2022-03-23 – 2022-03-24 (×2): 100 mg via ORAL
  Filled 2022-03-22 (×2): qty 1

## 2022-03-22 MED ORDER — SODIUM CHLORIDE 0.9 % IV BOLUS
500.0000 mL | Freq: Once | INTRAVENOUS | Status: AC
  Administered 2022-03-22: 500 mL via INTRAVENOUS

## 2022-03-22 NOTE — Assessment & Plan Note (Signed)
We will evaluate for TIA/ CVA. Obtain, [name] of brain and MRA neck.  Neurology consult per am team as indicated.  Fall precaution. Orthostatic vitals

## 2022-03-22 NOTE — Assessment & Plan Note (Signed)
Cardiology consult. amiodarone level Cut down dose to 100 mg daily.  TFT.

## 2022-03-22 NOTE — Assessment & Plan Note (Addendum)
AKI on CKD stage V. Lab Results  Component Value Date   CREATININE 4.29 (H) 03/22/2022   CREATININE 2.85 (H) 02/22/2022   CREATININE 2.04 (H) 02/14/2022      Latest Ref Rng & Units 03/22/2022    4:56 PM 02/22/2022    4:13 PM 02/14/2022   10:00 AM  BMP  Glucose 70 - 99 mg/dL 136  122  98   BUN 8 - 23 mg/dL 66  45  28   Creatinine 0.44 - 1.00 mg/dL 4.29  2.85  2.04   Sodium 135 - 145 mmol/L 137  138  140   Potassium 3.5 - 5.1 mmol/L 4.3  3.4  3.9   Chloride 98 - 111 mmol/L 102  100  109   CO2 22 - 32 mmol/L '26  28  24   '$ Calcium 8.9 - 10.3 mg/dL 9.4  9.8  9.6     Hold cozaar.  Hold lasix.

## 2022-03-22 NOTE — ED Notes (Signed)
Pt had syncopal episode today per granddaughter.

## 2022-03-22 NOTE — Assessment & Plan Note (Signed)
Stable no chest pain.  Consider cutting down amiodarone to maybe 100 mg daily. Cont statin and hydralazine.

## 2022-03-22 NOTE — Assessment & Plan Note (Signed)
Pt is bradycardic and amiodarone dose cut down to 100 mg.  Cardiology consulted . Amiodarone level. currently sinus rhythm.

## 2022-03-22 NOTE — Assessment & Plan Note (Signed)
Am lipid panel. Cont statin therapy.

## 2022-03-22 NOTE — Assessment & Plan Note (Signed)
    Latest Ref Rng & Units 03/22/2022    4:56 PM 02/14/2022   10:00 AM 01/17/2022    1:50 PM  CBC  WBC 4.0 - 10.5 K/uL 5.2  5.6  6.3   Hemoglobin 12.0 - 15.0 g/dL 10.2  9.6  10.3   Hematocrit 36.0 - 46.0 % 31.7  29.0  31.6   Platelets 150 - 400 K/uL 157  170  155   stable we will follow.

## 2022-03-22 NOTE — ED Triage Notes (Signed)
Patient c/o nausea/vomiting/weakness that started today. Husband reports hx nausea and it prescribed phenergan at home.

## 2022-03-22 NOTE — Telephone Encounter (Signed)
Referral for Speech Therapy sent to [facility] [phone].

## 2022-03-22 NOTE — ED Provider Notes (Signed)
[facility] Provider Note    Event Date/Time   First MD Initiated Contact with Patient 03/22/22 2005     (approximate)   History   Abdominal Pain   HPI  [name] is a 81 y.o. female who comes in with nausea, vomiting, weakness that started today.  Patient was prescribed Phenergan at home.  She reports that her symptoms have been worsening over the past few days and not getting any better.  She denies any vomiting just feels like she cannot eat.  She supposed to have an [name] in the next few days to rule out anything else causing this.  They thought that she could have postconcussive syndrome given she did have a fall back in May.  She denies any chest pain, shortness of breath  Physical Exam   Triage Vital Signs: ED Triage Vitals  Enc Vitals Group     BP 03/22/22 1653 (!) 104/51     Pulse Rate 03/22/22 1653 (!) 52     Resp 03/22/22 1653 16     Temp 03/22/22 1653 98 F (36.7 C)     Temp Source 03/22/22 1653 Oral     SpO2 03/22/22 1653 100 %     Weight 03/22/22 1649 140 lb (63.5 kg)     Height 03/22/22 1649 '5\' 2"'$  (1.575 m)     Head Circumference --      Peak Flow --      Pain Score 03/22/22 1649 0     Pain Loc --      Pain Edu? --      Excl. in [place]? --     Most recent vital signs: Vitals:   03/22/22 1653 03/22/22 1942  BP: (!) 104/51 129/68  Pulse: (!) 52 (!) 47  Resp: 16 16  Temp: 98 F (36.7 C)   SpO2: 100% 99%     General: Awake, no distress.  CV:  Good peripheral perfusion.  Resp:  Normal effort.  Abd:  No distention.  Other:     ED Results / Procedures / Treatments   Labs (all labs ordered are listed, but only abnormal results are displayed) Labs Reviewed  LIPASE, BLOOD - Abnormal; Notable for the following components:      Result Value   Lipase 55 (*)    All other components within normal limits  COMPREHENSIVE METABOLIC PANEL - Abnormal; Notable for the following components:   Glucose, Bld 136 (*)    BUN 66 (*)     Creatinine, Ser 4.29 (*)    GFR, Estimated 10 (*)    All other components within normal limits  CBC - Abnormal; Notable for the following components:   RBC 3.37 (*)    Hemoglobin 10.2 (*)    HCT 31.7 (*)    All other components within normal limits  URINALYSIS, ROUTINE W REFLEX MICROSCOPIC  TROPONIN I (HIGH SENSITIVITY)  TROPONIN I (HIGH SENSITIVITY)     EKG  My interpretation of EKG:  Sinus bradycardia rate of 47 without any ST elevation or T wave inversions, normal intervals  RADIOLOGY I have reviewed the CT head personally and no ICH   PROCEDURES:  Critical Care performed: No  Procedures   MEDICATIONS ORDERED IN ED: Medications  ondansetron (ZOFRAN-ODT) disintegrating tablet 4 mg (4 mg Oral Given 03/22/22 2006)     IMPRESSION / MDM / [place] / ED COURSE  I reviewed the triage vital signs and the nursing notes.   Patient's presentation  is most consistent with acute presentation with potential threat to life or bodily function.  Differential includes brain mass, intra-abdominal infection, ACS, Electra abnormalities, AKI, UTI  Lipase is slightly elevated.  CBC shows elevated creatinine to 4.29 baseline 4 weeks ago of 2.8  Cardiac markers negative.  CT reassuring.  Given patient is trending hemoglobin up will discuss with the hospital team for admission for AKI  The patient is on the cardiac monitor to evaluate for evidence of arrhythmia and/or significant heart rate changes.      FINAL CLINICAL IMPRESSION(S) / ED DIAGNOSES   Final diagnoses:  AKI (acute kidney injury) ([place])     Rx / DC Orders   ED Discharge Orders     None        Note:  This document was prepared using Dragon voice recognition software and may include unintentional dictation errors.   Vanessa [place], MD 03/22/22 2135

## 2022-03-22 NOTE — Assessment & Plan Note (Signed)
Pt to f/u with her cardiologist on regular basis.

## 2022-03-22 NOTE — Assessment & Plan Note (Signed)
    Latest Ref Rng & Units 03/22/2022    4:56 PM 02/14/2022   10:00 AM 01/17/2022    1:50 PM  CBC  WBC 4.0 - 10.5 K/uL 5.2  5.6  6.3   Hemoglobin 12.0 - 15.0 g/dL 10.2  9.6  10.3   Hematocrit 36.0 - 46.0 % 31.7  29.0  31.6   Platelets 150 - 400 K/uL 157  170  155    We will follow and iv ppi and stool occult.

## 2022-03-22 NOTE — Hospital Course (Signed)
[name] is a 81 y.o. female with medical history significant of anemia, heart disease, hypertension, paroxysmal atrial fibrillation, bradycardia coming in with complaints of abdominal pain.  Patient has some nausea and poor appetite.  She also has some constipation. She had a near syncope episode yesterday when she was walking from the restroom to her bedroom.  She was dizzy, she collapsed at the side of the bed, she never lost consciousness. She was started on furosemide about a month ago, since that time, she has not been feeling well.  She has intermittent dizziness, she has a poor appetite, she also had increased weakness. Upon arriving the hospital, she was found to have sinus bradycardia, which is chronic.  She also had acute on chronic renal failure.  She is treated with IV fluids.

## 2022-03-22 NOTE — H&P (Signed)
History and Physical    Patient: [name] TML:[id number] DOB: 1941-08-12 DOA: 03/22/2022 DOS: the patient was seen and examined on 03/22/2022 PCP: [name], MD  Patient coming from: Home  Chief Complaint:  Chief Complaint  Patient presents with   Abdominal Pain   HPI: [name] is a 81 y.o. female with medical history significant of anemia, heart disease, hypertension, paroxysmal atrial fibrillation, bradycardia coming in with complaints of abdominal pain.  Patient has been having nausea and abdominal pain for several days now.  HPI from the patient is limited.  Patient has had episodes of dizziness and syncope.She has past medical history of persistent atrial fibrillation status post ablation / Bradycardia, grade 2 diastolic dysfunction.  Patient sees Dr. Oval Linsey from [facility] in [place].  Last cardiology note is from 11 March 2022 showing that amlodipine caused ankle swelling, Aldactone was stopped, hydralazine was 50 mg.  Patient is on amiodarone 200 mg in addition to Eliquis, Cozaar, Lopressor, Protonix, Phenergan, Crestor.  Patient it is allergies to amlodipine Celebrex and tramadol. Patient had a renal artery Doppler in November 2022 which showed normal celiac artery normal superior mesenteric and inferior mesenteric artery of it along with no evidence of right or left renal artery stenosis. Most recent 2D echocardiogram ordered by cardiology was in March 2023 showing: Left ventricular ejection fraction, by estimation, is 60 to 65%. The left ventricle has normal function. The left ventricle has no regional wall motion abnormalities. Left ventricular diastolic parameters are indeterminate. 1. Right ventricular systolic function is normal. The right ventricular size is normal. There is mildly elevated pulmonary artery systolic pressure. 2. 3. Left atrial size was severely dilated. 4. Right atrial size was severely dilated. 5. The mitral  valve is degenerative. Moderate mitral valve regurgitation. 6. Tricuspid valve regurgitation is mild to moderate. The aortic valve is grossly normal. Aortic valve regurgitation is mild to moderate. Aortic valve sclerosis/calcification is present, without any evidence of aortic stenosis. 7. The inferior vena cava is normal in size with greater than 50% respiratory variability, suggesting right atrial pressure of 3 mmHg. Today family at bedside states that patient was going from the bathroom to the bedroom where she passed out and her husband found her on the floor called EMS and she was alert awake and oriented when she was on the floor. Blood work today shows acute kidney injury with a creatinine of 4.29 with a baseline of 2.8 and hemoglobin of 10.2 with a normal platelet count and white count of 5.2 glucose 136, negative head CT noncontrast today CT of the abdomen and pelvis is negative for any acute findings sees hepatic and left renal cyst moderate stool burden.   EKG today shows Sinus bradycardia:  Review of Systems  Gastrointestinal:  Positive for nausea and vomiting.  Neurological:  Positive for dizziness and loss of consciousness.  All other systems reviewed and are negative.   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 cath 2/09: no CAD, EF 70%   GERD (gastroesophageal reflux disease) 07/26/2021   HYPERLIPIDEMIA    HYPERTENSION    Mild Aortic insufficiency    Moderate mitral regurgitation    Moderate tricuspid regurgitation    OSTEOPENIA    PAF (paroxysmal atrial fibrillation) ([place])    a. s/p ablation 2013; b.  CHADS2VASc -> 4 (HTN, age x 2, female)-->Eliquis.   PAT (paroxysmal atrial tachycardia) ([place])    a. 04/2019 Zio: Occas PACs and rare PVCs. 21 atrial runs - longest 20 beats, max rate 169.   Pneumonia 2009   RA (rheumatoid  arthritis) ([facility])    Sinus Bradycardia    a. asymptomatic but prevents use of AVN blocking agents; b. 04/2019 Zio: Avg HR 61 (37-109).   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 05/2019 Echo: EF 60-65%. DD. RVSP 41.66mHg. Mod dil LA. Mod MR/TR. Mild AI.   Past Surgical History:  Procedure Laterality Date   ABLATION OF DYSRHYTHMIC FOCUS     CARDIAC CATHETERIZATION     COLONOSCOPY  04/18/08   Partial hysterectomy--1979     Thoracentesis   12/20/07     Social History:  reports that she has never smoked. She has never used smokeless tobacco. She reports that she does not drink alcohol and does not use drugs.  Allergies  Allergen Reactions   Amlodipine     Ankle swelling   Celecoxib Other (See Comments) and Rash    Tachycardia/palpitations Other reaction(s): Other Tachycardia/palpitations Tachycardia/palpitations   Tramadol Nausea Only    Family History  Problem Relation Age of Onset   Diabetes Other    Breast cancer Paternal Aunt [phone]  Heart disease Mother    Pancreatic cancer Father    Atrial fibrillation Brother    Heart disease Brother    Heart attack Brother     Prior to Admission medications   Medication Sig Start Date End Date Taking? Authorizing Provider  amiodarone (PACERONE) 200 MG tablet Taking one tablet by mouth daily 01/13/22   CSherran Needs NP  apixaban (ELIQUIS) 2.5 MG TABS tablet Take 1 tablet (2.5 mg total) by mouth 2 (two) times daily. 03/17/22   RSkeet Latch MD  B Complex Vitamins (VITAMIN B COMPLEX) TABS Take 1 tablet by mouth daily.  Patient not taking: Reported on 03/21/2022    [provider]  D 1000 25 MCG (1000 UT) capsule TAKE 1 CAPSULE BY MOUTH AT BEDTIME. Patient not taking: Reported on 03/21/2022 10/01/21   RSkeet Latch MD  furosemide (LASIX) 40 MG tablet Take 1 tablet (40 mg total) by mouth daily. 02/17/22   RSkeet Latch MD  hydrALAZINE (APRESOLINE) 50 MG tablet Take 1 tablet (50 mg total) by mouth every 8  (eight) hours. 02/17/22   RSkeet Latch MD  losartan (COZAAR) 100 MG tablet TAKE 1 TABLET BY MOUTH ONCE DAILY 01/31/22   RSkeet Latch MD  pantoprazole (PROTONIX) 40 MG tablet Take 1 tablet (40 mg total) by mouth daily. 07/26/21   RSkeet Latch MD  potassium chloride SA (KLOR-CON M) 20 MEQ tablet Take 1 tablet (20 mEq total) by mouth daily. Take when taking furosemide. 02/23/22   Bedsole, Amy E, MD  promethazine (PHENERGAN) 25 MG tablet Take 1 tablet (25 mg total) by mouth every 8 (eight) hours as needed for nausea or vomiting. 03/04/22   BDiona Browner Amy E, MD  rosuvastatin (CRESTOR) 10 MG tablet TAKE 1 TABLET BY MOUTH ONCE DAILY 01/31/22   RSkeet Latch MD    Physical Exam: Vitals:   03/22/22 1653 03/22/22 1942 03/22/22 2030 03/22/22 2120  BP: (!) 104/51 129/68 122/60 (!) 147/62  Pulse: (!) 52 (!) 47 (!) 49 (!) 53  Resp: '16 16 16 16  '$ Temp: 98 F (36.7 C)     TempSrc: Oral     SpO2: 100% 99%  99% 100%  Weight:      Height:      Physical Exam Vitals and nursing note reviewed.  Constitutional:      General: She is not in acute distress.    Appearance: Normal appearance. She is not ill-appearing, toxic-appearing or diaphoretic.  HENT:     Head: Normocephalic and atraumatic.     Right Ear: Hearing and external ear normal.     Left Ear: Hearing and external ear normal.     Nose: Nose normal. No nasal deformity.     Mouth/Throat:     Lips: Pink.     Mouth: Mucous membranes are moist.     Tongue: No lesions.     Pharynx: Oropharynx is clear.  Eyes:     Extraocular Movements: Extraocular movements intact.     Pupils: Pupils are equal, round, and reactive to light.  Neck:     Vascular: No carotid bruit.  Cardiovascular:     Rate and Rhythm: Regular rhythm. Bradycardia present.     Pulses: Normal pulses.     Heart sounds: Murmur heard.  Pulmonary:     Effort: Pulmonary effort is normal.     Breath sounds: Normal breath sounds.  Abdominal:     General: Bowel sounds are  normal. There is no distension.     Palpations: Abdomen is soft. There is no mass.     Tenderness: There is no abdominal tenderness. There is no guarding.     Hernia: No hernia is present.  Musculoskeletal:     Right lower leg: No edema.     Left lower leg: No edema.  Skin:    General: Skin is warm.  Neurological:     General: No focal deficit present.     Mental Status: She is alert and oriented to person, place, and time.     Cranial Nerves: Cranial nerves 2-12 are intact.     Motor: Motor function is intact.  Psychiatric:        Attention and Perception: Attention normal.        Mood and Affect: Mood normal.        Speech: Speech normal.        Behavior: Behavior normal. Behavior is cooperative.        Cognition and Memory: Cognition normal.     Data Reviewed: Results for orders placed or performed during the hospital encounter of 03/22/22 (from the past 24 hour(s))  Lipase, blood     Status: Abnormal   Collection Time: 03/22/22  4:56 PM  Result Value Ref Range   Lipase 55 (H) 11 - 51 U/L  Comprehensive metabolic panel     Status: Abnormal   Collection Time: 03/22/22  4:56 PM  Result Value Ref Range   Sodium 137 135 - 145 mmol/L   Potassium 4.3 3.5 - 5.1 mmol/L   Chloride 102 98 - 111 mmol/L   CO2 26 22 - 32 mmol/L   Glucose, Bld 136 (H) 70 - 99 mg/dL   BUN 66 (H) 8 - 23 mg/dL   Creatinine, Ser 4.29 (H) 0.44 - 1.00 mg/dL   Calcium 9.4 8.9 - 10.3 mg/dL   Total Protein 7.8 6.5 - 8.1 g/dL   Albumin 4.2 3.5 - 5.0 g/dL   AST 28 15 - 41 U/L   ALT 21 0 - 44 U/L   Alkaline Phosphatase 50 38 - 126 U/L   Total Bilirubin 0.9 0.3 - 1.2 mg/dL   GFR, Estimated 10 (L) >60  mL/min   Anion gap 9 5 - 15  CBC     Status: Abnormal   Collection Time: 03/22/22  4:56 PM  Result Value Ref Range   WBC 5.2 4.0 - 10.5 K/uL   RBC 3.37 (L) 3.87 - 5.11 MIL/uL   Hemoglobin 10.2 (L) 12.0 - 15.0 g/dL   HCT 31.7 (L) 36.0 - 46.0 %   MCV 94.1 80.0 - 100.0 fL   MCH 30.3 26.0 - 34.0 pg   MCHC  32.2 30.0 - 36.0 g/dL   RDW 13.6 11.5 - 15.5 %   Platelets 157 150 - 400 K/uL   nRBC 0.0 0.0 - 0.2 %  Troponin I (High Sensitivity)     Status: None   Collection Time: 03/22/22  7:55 PM  Result Value Ref Range   Troponin I (High Sensitivity) 6 <18 ng/L     Assessment and Plan: * Syncope and collapse We will evaluate for TIA/ CVA. Obtain, [name] of brain and MRA neck.  Neurology consult per am team as indicated.  Fall precaution. Orthostatic vitals     SINUS BRADYCARDIA Cardiology consult. amiodarone level Cut down dose to 100 mg daily.  TFT.    Abdominal pain Ct abd and pelvis negative for any acute issue.  We will get lactic acid level.  Npo/ IV PPI.    HYPERLIPIDEMIA Am lipid panel. Cont statin therapy.    Anemia of chronic disease    Latest Ref Rng & Units 03/22/2022    4:56 PM 02/14/2022   10:00 AM 01/17/2022    1:50 PM  CBC  WBC 4.0 - 10.5 K/uL 5.2  5.6  6.3   Hemoglobin 12.0 - 15.0 g/dL 10.2  9.6  10.3   Hematocrit 36.0 - 46.0 % 31.7  29.0  31.6   Platelets 150 - 400 K/uL 157  170  155   stable we will follow.     Aortic valve sclerosis Pt to f/u with her cardiologist on regular basis.    Acute kidney injury superimposed on chronic kidney disease ([place]) AKI on CKD stage V. Lab Results  Component Value Date   CREATININE 4.29 (H) 03/22/2022   CREATININE 2.85 (H) 02/22/2022   CREATININE 2.04 (H) 02/14/2022      Latest Ref Rng & Units 03/22/2022    4:56 PM 02/22/2022    4:13 PM 02/14/2022   10:00 AM  BMP  Glucose 70 - 99 mg/dL 136  122  98   BUN 8 - 23 mg/dL 66  45  28   Creatinine 0.44 - 1.00 mg/dL 4.29  2.85  2.04   Sodium 135 - 145 mmol/L 137  138  140   Potassium 3.5 - 5.1 mmol/L 4.3  3.4  3.9   Chloride 98 - 111 mmol/L 102  100  109   CO2 22 - 32 mmol/L '26  28  24   '$ Calcium 8.9 - 10.3 mg/dL 9.4  9.8  9.6     Hold cozaar.  Hold lasix.    Anemia of chronic renal failure, stage 3b ([facility])    Latest Ref Rng & Units 03/22/2022    4:56 PM  02/14/2022   10:00 AM 01/17/2022    1:50 PM  CBC  WBC 4.0 - 10.5 K/uL 5.2  5.6  6.3   Hemoglobin 12.0 - 15.0 g/dL 10.2  9.6  10.3   Hematocrit 36.0 - 46.0 % 31.7  29.0  31.6   Platelets 150 - 400 K/uL 157  170  155    We will follow and iv ppi and stool occult.     Essential hypertension Blood pressure (!) 147/62, pulse (!) 53, temperature 98 F (36.7 C), temperature source Oral, resp. rate 16, height '5\' 2"'$  (1.575 m), weight 63.5 kg, SpO2 100 %. BP is stable but range has been variable . I suspect she may be orthostatic. Hold cozaar.    Status post ablation of atrial fibrillation Pt is bradycardic and amiodarone dose cut down to 100 mg.  Cardiology consulted . Amiodarone level. currently sinus rhythm.   Coronary artery disease, non-occlusive Stable no chest pain.  Consider cutting down amiodarone to maybe 100 mg daily. Cont statin and hydralazine.      Advance Care Planning:    Code Status: Full Code   Consults:  [id number].   Family Communication:  Keyonna, [place] (Spouse)  [phone] ([place])  Severity of Illness: The appropriate patient status for this patient is OBSERVATION. Observation status is judged to be reasonable and necessary in order to provide the required intensity of service to ensure the patient's safety. The patient's presenting symptoms, physical exam findings, and initial radiographic and laboratory data in the context of their medical condition is felt to place them at decreased risk for further clinical deterioration. Furthermore, it is anticipated that the patient will be medically stable for discharge from the hospital within 2 midnights of admission.   Author: Para Skeans, MD 03/22/2022 11:42 PM  For on call review [email or web address].

## 2022-03-22 NOTE — ED Notes (Addendum)
Pt taken to triage for reassessment; pt c/o nausea only; denies any c/o pain; husband st that she "almost passed out"; st was going to bed and she was "stumbling"; he helped lower to the floor on her knees; st EMS came and assisted her up and vss and recommended that she come to hosp for evaluation; st she ambulated to car with assistance; st sched for [name] on Friday after hitting head several mos ago because she has had diff remembering things with nausea

## 2022-03-22 NOTE — Assessment & Plan Note (Signed)
Blood pressure (!) 147/62, pulse (!) 53, temperature 98 F (36.7 C), temperature source Oral, resp. rate 16, height '5\' 2"'$  (1.575 m), weight 63.5 kg, SpO2 100 %. BP is stable but range has been variable . I suspect she may be orthostatic. Hold cozaar.

## 2022-03-22 NOTE — Assessment & Plan Note (Signed)
Ct abd and pelvis negative for any acute issue.  We will get lactic acid level.  Npo/ IV PPI.

## 2022-03-23 ENCOUNTER — Telehealth (HOSPITAL_BASED_OUTPATIENT_CLINIC_OR_DEPARTMENT_OTHER): Payer: Self-pay | Admitting: *Deleted

## 2022-03-23 DIAGNOSIS — I4819 Other persistent atrial fibrillation: Secondary | ICD-10-CM

## 2022-03-23 DIAGNOSIS — N179 Acute kidney failure, unspecified: Secondary | ICD-10-CM

## 2022-03-23 DIAGNOSIS — I10 Essential (primary) hypertension: Secondary | ICD-10-CM

## 2022-03-23 DIAGNOSIS — N189 Chronic kidney disease, unspecified: Secondary | ICD-10-CM

## 2022-03-23 LAB — TROPONIN I (HIGH SENSITIVITY): Troponin I (High Sensitivity): 5 ng/L (ref <18)

## 2022-03-23 LAB — LACTIC ACID, PLASMA: Lactic Acid, Venous: 1 mmol/L (ref 0.5–1.9)

## 2022-03-23 LAB — T4, FREE: Free T4: 1.54 ng/dL — ABNORMAL HIGH (ref 0.61–1.12)

## 2022-03-23 LAB — CBG MONITORING, ED
Glucose-Capillary: 93 mg/dL (ref 70–99)
Glucose-Capillary: 97 mg/dL (ref 70–99)
Glucose-Capillary: 99 mg/dL (ref 70–99)

## 2022-03-23 LAB — HEMOGLOBIN A1C
Hgb A1c MFr Bld: 5.3 % (ref 4.8–5.6)
Mean Plasma Glucose: 105.41 mg/dL

## 2022-03-23 LAB — BRAIN NATRIURETIC PEPTIDE: B Natriuretic Peptide: 102.4 pg/mL — ABNORMAL HIGH (ref 0.0–100.0)

## 2022-03-23 LAB — TSH: TSH: 0.645 u[IU]/mL (ref 0.350–4.500)

## 2022-03-23 MED ORDER — SENNOSIDES-DOCUSATE SODIUM 8.6-50 MG PO TABS
2.0000 | ORAL_TABLET | Freq: Two times a day (BID) | ORAL | Status: DC
  Administered 2022-03-23 – 2022-03-24 (×3): 2 via ORAL
  Filled 2022-03-23 (×3): qty 2

## 2022-03-23 NOTE — Telephone Encounter (Signed)
Received notification from family patient in hospital and wanted Dr Oval Linsey to know.   Will forward as FYI

## 2022-03-23 NOTE — Progress Notes (Signed)
Admission profile updated. ?

## 2022-03-23 NOTE — TOC Initial Note (Signed)
Transition of Care [facility]) - Initial/Assessment Note    Patient Details  Name: [name] MRN: [id number] Date of Birth: 05/07/1941  [facility] CM/SW Contact:    Pete Pelt, RN Phone Number: 03/23/2022, 2:22 PM  Clinical Narrative:      Patient lives at home with spouse.  No concerns with returning home and no transportation concerns.  Patient is able to obtain meds, and family state that meds are prepackaged in bubble packs to allow for accuracy and compliance.            Patient does not currently have any home health or DME, states they are open to either if medically indicated.   TOC will monitor for needs.  Expected Discharge Plan:  (TBD) Barriers to Discharge: Continued Medical Work up   Patient Goals and CMS Choice        Expected Discharge Plan and Services Expected Discharge Plan:  (TBD)   Discharge Planning Services: CM Consult   Living arrangements for the past 2 months: Single Family Home                                      Prior Living Arrangements/Services Living arrangements for the past 2 months: Single Family Home Lives with:: Self, Spouse Patient language and need for interpreter reviewed:: Yes Do you feel safe going back to the place where you live?: Yes      Need for Family Participation in Patient Care: Yes (Comment) Care giver support system in place?: Yes (comment) Current home services:  (none currently) Criminal Activity/Legal Involvement Pertinent to Current Situation/Hospitalization: No - Comment as needed  Activities of Daily Living Home Assistive Devices/Equipment: Eyeglasses ADL Screening (condition at time of admission) Patient's cognitive ability adequate to safely complete daily activities?: Yes Is the patient deaf or have difficulty hearing?: No Does the patient have difficulty seeing, even when wearing glasses/contacts?: Yes Does the patient have difficulty concentrating, remembering, or making  decisions?: No Patient able to express need for assistance with ADLs?: Yes Does the patient have difficulty dressing or bathing?: Yes Independently performs ADLs?: Yes (appropriate for developmental age) Does the patient have difficulty walking or climbing stairs?: Yes Weakness of Legs: Both Weakness of Arms/Hands: None  Permission Sought/Granted Permission sought to share information with : Case Manager Permission granted to share information with : Yes, Verbal Permission Granted              Emotional Assessment Appearance:: Appears stated age Attitude/Demeanor/Rapport: Gracious, Engaged Affect (typically observed): Pleasant, Appropriate Orientation: : Oriented to Self, Oriented to Place, Oriented to  Time Alcohol / Substance Use: Not Applicable Psych Involvement: No (comment)  Admission diagnosis:  Syncope and collapse [R55] Patient Active Problem List   Diagnosis Date Noted   Syncope and collapse 03/22/2022   Abdominal pain 03/22/2022   Acute kidney injury superimposed on chronic kidney disease ([place]) 03/22/2022   Nausea 03/03/2022   Decreased appetite 03/03/2022   Carotid artery calcification, bilateral 12/21/2021   Osteoarthritis of cervical spine 12/21/2021   Atrial fibrillation with rapid ventricular response ([place]) 08/18/2021   Acute on chronic diastolic CHF (congestive heart failure) ([place]) 08/17/2021   Adnexal mass 08/04/2021   Anemia of chronic renal failure, stage 3b ([place]) 08/03/2021   GERD (gastroesophageal reflux disease) 07/26/2021   Acute pain of left hip 04/20/2021   COVID-19 virus infection 04/08/2021   Memory loss 11/10/2020  Urinary frequency 11/10/2020   Sinus bradycardia 07/18/2019   Valvular heart disease 07/18/2019   PSVT (paroxysmal supraventricular tachycardia) ([place]) 05/16/2019   Persistent atrial fibrillation ([place]) 07/19/2018   Peripheral edema 02/02/2018   Acute nonintractable headache 02/02/2018   Coronary artery disease, non-occlusive  09/14/2017   Status post ablation of atrial fibrillation 09/14/2017   Essential hypertension 09/14/2017   Tinea pedis 07/14/2017   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/15/2016   History of rheumatoid arthritis 03/14/2016   Mitral regurgitation 12/30/2015   Obesity 12/02/2015   Aortic valve insufficiency 12/02/2015   Vitamin D deficiency 04/17/2015   Counseling regarding end of life decision making 04/17/2015   Allergic rhinitis 02/03/2015   Aortic valve sclerosis 08/19/2010   Osteopenia 05/11/2010   SINUS BRADYCARDIA 08/19/2009   Anemia of chronic disease 12/13/2007   HYPERLIPIDEMIA 08/23/2007   UNSPECIFIED GLAUCOMA 10/10/2006   PCP:  [name], MD Pharmacy:   [place], [place] - [place] [address] [place] [address] Phone: [phone] Fax: [phone]     Social Determinants of Health (SDOH) Interventions    Readmission Risk Interventions     No data to display

## 2022-03-23 NOTE — Telephone Encounter (Signed)
March 23, 2022 [name], MD to [facility]      03/23/22 11:31 AM [place].  Thanks for letting me know.  I asked the team there to go see her

## 2022-03-23 NOTE — Progress Notes (Addendum)
  Progress Note   Patient: [name] UUV:[id number] DOB: 1941-07-30 DOA: 03/22/2022     1 DOS: the patient was seen and examined on 03/23/2022   Brief hospital course: MACIEL KEGG is a 81 y.o. female with medical history significant of anemia, heart disease, hypertension, paroxysmal atrial fibrillation, bradycardia coming in with complaints of abdominal pain.  Patient has some nausea and poor appetite.  She also has some constipation. She had a near syncope episode yesterday when she was walking from the restroom to her bedroom.  She was dizzy, she collapsed at the side of the bed, she never lost consciousness. She was started on furosemide about a month ago, since that time, she has not been feeling well.  She has intermittent dizziness, she has a poor appetite, she also had increased weakness. Upon arriving the hospital, she was found to have sinus bradycardia, which is chronic.  She also had acute on chronic renal failure.  She is treated with IV fluids.  Assessment and Plan: Near syncope secondary to dehydration. 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 This appears to be secondary to side effect from medication.  Currently, patient does not have any volume overload.  She appears to be dehydrated.  Renal function at baseline with 2.0, increased to 4.29 today. I will start a gentle rehydration.  Paroxysmal atrial fibrillation. Sinus bradycardia. Discussed with cardiology, patient has chronic bradycardia, seems to be at baseline.  Amiodarone dose is decreased.  Essential hypertension. Due to worsening renal function, Cozaar is on hold.  Coronary artery disease. Appear to be stable.  Abdominal pain. Patient had a 2 days of abdominal pain, which has resolved today.  Most likely gastritis versus constipation.  Continue PPI.  Continue stool softener.     Subjective:  Still has significant weakness, no dizziness. No shortness of breath  Physical  Exam: Vitals:   03/23/22 1000 03/23/22 1100 03/23/22 1200 03/23/22 1405  BP: (!) 97/42 (!) 113/51 (!) 119/59   Pulse: (!) 53 (!) 49 (!) 47   Resp: '16 15 18   '$ Temp:    99.1 F (37.3 C)  TempSrc:    Oral  SpO2: 96% 96% 99%   Weight:      Height:       General exam: Appears calm and comfortable  Respiratory system: A few crackles in the base. Respiratory effort normal. Cardiovascular system: S1 & S2 heard, bradycardic, no JVD, murmurs, rubs, gallops or clicks. No pedal edema. Gastrointestinal system: Abdomen is nondistended, soft and nontender. No organomegaly or masses felt. Normal bowel sounds heard. Central nervous system: Alert and oriented. x3 No focal neurological deficits. Extremities: Symmetric 5 x 5 power. Skin: No rashes, lesions or ulcers Psychiatry: Judgement and insight appear normal. Mood & affect appropriate.   Data Reviewed:  EKG reviewed, lab results reviewed.  CT of abdomen pelvis and CT head reviewed.  [name] brain and neck reviewed Family Communication: Husband and daughter at bedside, updated.  Disposition: Status is: Inpatient Remains inpatient appropriate because: Severity of disease, IV treatment  Planned Discharge Destination: Home with Home Health    Time spent: 55 minutes  Author: Sharen Hones, MD 03/23/2022 2:50 PM  For on call review [email or web address].

## 2022-03-23 NOTE — Progress Notes (Signed)
SLP Cancellation Note  Patient Details Name: [name] MRN: [id number] DOB: 13-Apr-1941   Cancelled treatment:       Reason Eval/Treat Not Completed: SLP screened, no needs identified, will sign off (chart reviewed; consulted NSG then met w/ pt and Husband in room) Pt denied any difficulty swallowing and is currently on a regular diet. She tolerated a few bites/sips at breakfast meal w/ No swallowing deficits per Husband/pt; she swallowed Pills w/ water appropriately, per NSG. Pt did c/o "Larger" Pills, so the suggestion of the "applesauce trick was discussed and explained w/ pt/Husband. Both agreed to try it w/ a large pill next time.  Pt conversed in general conversation and answered basic questions re: self and the morning w/out expressive/receptive deficits noted; pt denied any speech-language deficits "today" and endorsed she was "talking much better now, not like yesterday". Speech clear and intelligible; soft voice/low volume which is baseline for her. Discussed pt's/Husband's general concerns about lack of desire/appetite and weight loss over "several months now". Dehydration and malnourishment can impact Cognition and communication. Encouraged pt/Husband to f/u w/ her MD/Nephrologist as they identified the "changes" occurring when she began taking Lasix. Current NSG was updated re: this as well.   No further skilled ST services indicated as pt appears at her baseline. Encouraged pt/Husband to f/u w/ her PCP for any further need/recommendation for f/u post discharge if indicated. Pt/Husband agreed. NSG to reconsult if any change in status while admitted.         Orinda Kenner, MS, CCC-SLP Speech Language Pathologist Rehab Services; [place] [phone] (ascom) Shaeleigh Graw 03/23/2022, 11:59 AM

## 2022-03-23 NOTE — Consult Note (Signed)
Cardiology Consultation:   Patient ID: [name] MRN: [id number]; DOB: [birth year]  Admit date: 03/22/2022 Date of Consult: 03/23/2022  PCP:  [name], MD   [facility] Providers Cardiologist:  [name], MD        Patient Profile:   [name] is a 81 y.o. female with a hx of persistent atrial fibrillation status post ablation, chronic bradycardia, CKD 2, mitral regurgitation, aortic regurgitation, essential hypertension, and hyperlipidemia who is being seen 03/23/2022 for the evaluation of syncope with collapse at the request of Dr. Posey Pronto.  History of Present Illness:   [name] is very well-known to Dr. Oval Linsey and has been followed in [place] since 12/2020.  She had an echocardiogram that was completed on 05/14/2019 that revealed an LVH of 60-65% with grade 2 diastolic dysfunction and mildly elevated pulmonary pressures.  She was initially seen on 12/2020 to establish care and at the hypertension clinic.  She was first diagnosed with hypertension she said several decades prior.  Her losartan been increased to 100 mg daily and hydralazine initiated and subsequently uptitrated.  Chlorthalidone previously reduced due to renal function and clonidine was initiated.  Renal artery Dopplers performed 08/2021 were normal.  Hospitalization on 08/2021 with atrial fibrillation with RVR treated with IV diltiazem and metoprolol.  Home hydralazine held and discharged on metoprolol 12.5 mg twice daily.  At follow-up noted to be bradycardic so clonidine held metoprolol reduced to as needed.  Followed up with the EP though not felt to be a dofetilide candidate due to to CKD.  Discussion of ablation versus amiodarone and 2-week monitor placed revealing 28 runs SVT up to 17 beats with an average heart rate of 53 bpm.  She is [name] 10/2021 was started on amiodarone.  Follow-up again on 11/18/2021 ED visit due to head injury with imaging revealed small nonbleeding occipital hematoma  suitable for outpatient management.  She was then seen on 11/2021 by Dr. Oval Linsey blood pressure at home was in the 160s and hydralazine was resumed at 25 mg twice daily.  Echo 12/2021 with normal LVEF 60 to 65%, moderate MR, mild to moderate AI.  Seen 12/29/2021 and hydralazine increased to 50 mg twice daily due to uncontrolled hypertension.  Atrial fibrillation clinic visit on 4/76/23 found to be maintaining normal sinus rhythm with with amiodarone and metoprolol continued.  She was seen in the emergency department on 02/14/2022 with increasing exertional dyspnea, BNP was 244, cardiac enzymes were negative, she was given Lasix 20 mg to be taken for 5 days.  She not had any improvement in her edema.  Her blood pressures ranged 1 10-1 50s over 60-70.  She was scheduled to follow-up with nephrology at that time.  She saw her PCP a week later and her furosemide was reduced.  She continues to remain bradycardic on EKG which is fairly typical for her and she had not had any lightheadedness, headaches, or syncope.  So she comes into the emergency department on 03/22/2022 accompanied by granddaughter for syncope and collapse.  She had associated symptoms of nausea and vomiting and weakness that started the same day.  According to family she had been taking Phenergan that had been prescribed at home.  She reported that her symptoms have been worsening over the past few days and not getting any better.  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 She denies any chest pain or shortness of breath.   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 cath 2/09: no CAD, EF 70%   GERD (gastroesophageal reflux disease) 07/26/2021   HYPERLIPIDEMIA    HYPERTENSION    Mild Aortic insufficiency    Moderate mitral regurgitation    Moderate tricuspid regurgitation    OSTEOPENIA    PAF (paroxysmal atrial fibrillation) ([place])    a. s/p ablation 2013; b. CHADS2VASc -> 4 (HTN, age x 2, female)-->Eliquis.   PAT (paroxysmal atrial tachycardia) ([name])    a. 04/2019 Zio: Occas PACs and rare PVCs. 21 atrial runs - longest 20 beats, max rate 169.   Pneumonia 2009   RA (rheumatoid arthritis) ([facility])    Sinus Bradycardia    a. asymptomatic but prevents use of AVN blocking agents; b. 04/2019 Zio: Avg HR 61 (37-109).   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 05/2019 Echo: EF 60-65%. DD. RVSP 41.42mHg. Mod dil LA. Mod MR/TR. Mild AI.    Past Surgical History:  Procedure Laterality Date   ABLATION OF DYSRHYTHMIC FOCUS     CARDIAC CATHETERIZATION     COLONOSCOPY  04/18/08   Partial hysterectomy--1979     Thoracentesis   12/20/07       Home Medications:  Prior to Admission medications   Medication Sig Start Date End Date Taking? Authorizing Provider  acetaminophen (TYLENOL) 325 MG tablet Take 325 mg by mouth every 6 (six) hours as needed for mild pain.   Yes [provider]  amiodarone (PACERONE) 200 MG tablet Taking one tablet by mouth daily 01/13/22  Yes CSherran Needs NP  apixaban (ELIQUIS) 2.5 MG TABS tablet Take 1 tablet (2.5 mg total) by mouth 2 (two) times daily. 03/17/22  Yes RSkeet Latch MD  D 1000 25 MCG (1000 UT) capsule TAKE 1 CAPSULE BY MOUTH AT BEDTIME. 10/01/21  Yes RSkeet Latch MD  hydrALAZINE (APRESOLINE) 50 MG tablet Take 1 tablet (50 mg total) by mouth every 8 (eight) hours. 02/17/22  Yes RSkeet Latch  MD  losartan (COZAAR) 100 MG tablet TAKE 1 TABLET BY MOUTH ONCE DAILY 01/31/22  Yes RSkeet Latch MD  pantoprazole (PROTONIX) 40 MG tablet Take 1 tablet (40 mg total) by mouth daily. 07/26/21  Yes RSkeet Latch MD  potassium chloride SA (KLOR-CON M) 20 MEQ tablet Take 1 tablet (20 mEq total) by mouth daily. Take when taking furosemide. 02/23/22  Yes Bedsole, Amy E, MD  promethazine (PHENERGAN) 25 MG tablet Take 1 tablet (25 mg total) by mouth every 8 (eight) hours as needed for nausea or vomiting. 03/04/22  Yes Bedsole, Amy E, MD  rosuvastatin (CRESTOR) 10 MG tablet TAKE 1 TABLET BY MOUTH ONCE DAILY 01/31/22  Yes RSkeet Latch MD  B Complex Vitamins (VITAMIN B COMPLEX) TABS Take 1 tablet by mouth daily.  Patient not taking: Reported on 03/21/2022    [provider]  furosemide (LASIX) 40 MG tablet Take 1 tablet (40 mg total) by mouth  daily. Patient not taking: Reported on 03/23/2022 02/17/22   [name], MD    Inpatient Medications: Scheduled Meds:  amiodarone  100 mg Oral Daily   apixaban  2.5 mg Oral BID   hydrALAZINE  50 mg Oral Q8H   pantoprazole  40 mg Oral Daily   rosuvastatin  10 mg Oral Daily   senna-docusate  2 tablet Oral BID   sodium chloride flush  3 mL Intravenous Q12H   Continuous Infusions:  lactated ringers 20 mL/hr at 03/23/22 0120   PRN Meds: acetaminophen **OR** acetaminophen, promethazine  Allergies:    Allergies  Allergen Reactions   Amlodipine     Ankle swelling   Celecoxib Other (See Comments) and Rash    Tachycardia/palpitations Other reaction(s): Other Tachycardia/palpitations Tachycardia/palpitations   Tramadol Nausea Only    Social History:   Social History   Socioeconomic History   Marital status: Married    Spouse name: Not on file   Number of children: Not on file   Years of education: Not on file   Highest education level: Not on file  Occupational History   Not on file  Tobacco Use   Smoking status: Never    Smokeless tobacco: Never  Vaping Use   Vaping Use: Never used  Substance and Sexual Activity   Alcohol use: No    Alcohol/week: 0.0 standard drinks of alcohol   Drug use: No   Sexual activity: Never    Birth control/protection: None  Other Topics Concern   Not on file  Social History Narrative   Marital Status: widow x 1 yrChildren: 39, grandchildren 50, numerous great grand 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 Full Code ( reviewed 2015) Occasional exercise.Diet: fruits and veggies, lean meats.   Right handed    Caffeine- none    Social Determinants of Health   Financial Resource Strain: Not on file  Food Insecurity: Not on file  Transportation Needs: Not on file  Physical Activity: Not on file  Stress: Not on file  Social Connections: Not on file  Intimate Partner Violence: Not on file    Family History:    Family History  Problem Relation Age of Onset   Diabetes Other    Breast cancer Paternal Aunt 71   Heart disease Mother    Pancreatic cancer Father    Atrial fibrillation Brother    Heart disease Brother    Heart attack Brother      ROS:  Please see the history of present illness.  Review of Systems  Constitutional:  Positive for malaise/fatigue and weight loss.  HENT: Negative.    Eyes: Negative.   Respiratory: Negative.    Cardiovascular: Negative.   Gastrointestinal:  Positive for nausea and vomiting.  Genitourinary: Negative.   Musculoskeletal: Negative.   Skin: Negative.   Neurological:  Positive for weakness.  Endo/Heme/Allergies: Negative.   Psychiatric/Behavioral: Negative.      All other ROS reviewed and negative.     Physical Exam/Data:   Vitals:   03/23/22 0500 03/23/22 0722  03/23/22 1000 03/23/22 1100  BP: (!) 100/45 (!) 122/59 (!) 97/42 (!) 113/51  Pulse: (!) 46 (!) 51 (!) 53 (!) 49  Resp: '15 18 16 15  '$ Temp:  98.1 F (36.7 C)    TempSrc:  Oral    SpO2: 96% 98% 96% 96%  Weight:  Height:        Intake/Output Summary (Last 24 hours) at 03/23/2022 1159 Last data filed at 03/23/2022 0800 Gross per 24 hour  Intake 500 ml  Output 600 ml  Net -100 ml      03/22/2022    4:49 PM 03/21/2022   10:16 AM 03/17/2022    9:03 AM  Last 3 Weights  Weight (lbs) 140 lb 136 lb 132 lb  Weight (kg) 63.504 kg 61.689 kg 59.875 kg     Body mass index is 25.61 kg/m.  General:  Well nourished, well developed, in no acute distress HEENT: normal Neck: no JVD Vascular: No carotid bruits; Distal pulses 2+ bilaterally Cardiac:  normal S1, S2; RRR, bradycardiac; II/VI systolic murmur best heard at the RSB without radiation into the bilateral carotids  Lungs:  clear to auscultation bilaterally, no wheezing, rhonchi or rales, respirations are unlabored at rest on room air Abd: soft, nontender, no hepatomegaly  Ext: no edema Musculoskeletal:  No deformities, BUE and BLE strength normal and equal Skin: warm and dry  Neuro:  CNs 2-12 intact, no focal abnormalities noted Psych:  Normal affect   EKG:  The EKG was personally reviewed and demonstrates:  SB rate of 47 Telemetry:  Telemetry was personally reviewed and demonstrates:  SB rate in the 40's  Relevant CV Studies: Echocardiogram completed on 12/29/2021  1. Left ventricular ejection fraction, by estimation, is 60 to 65%. The  left ventricle has normal function. The left ventricle has no regional  wall motion abnormalities. Left ventricular diastolic parameters are  indeterminate.   2. Right ventricular systolic function is normal. The right ventricular  size is normal. There is mildly elevated pulmonary artery systolic  pressure.   3. Left atrial size was severely dilated.   4. Right atrial size was severely dilated.    5. The mitral valve is degenerative. Moderate mitral valve regurgitation.   6. Tricuspid valve regurgitation is mild to moderate.   7. The aortic valve is grossly normal. Aortic valve regurgitation is mild  to moderate. Aortic valve sclerosis/calcification is present, without any  evidence of aortic stenosis.   8. The inferior vena cava is normal in size with greater than 50%  respiratory variability, suggesting right atrial pressure of 3 mmHg.   Laboratory Data:  High Sensitivity Troponin:   Recent Labs  Lab 03/22/22 1955 03/22/22 2227 03/23/22 0150  TROPONINIHS '6 5 5     '$ Chemistry Recent Labs  Lab 03/22/22 1656  NA 137  K 4.3  CL 102  CO2 26  GLUCOSE 136*  BUN 66*  CREATININE 4.29*  CALCIUM 9.4  GFRNONAA 10*  ANIONGAP 9    Recent Labs  Lab 03/22/22 1656  PROT 7.8  ALBUMIN 4.2  AST 28  ALT 21  ALKPHOS 50  BILITOT 0.9   Lipids No results for input(s): "CHOL", "TRIG", "HDL", "LABVLDL", "LDLCALC", "CHOLHDL" in the last 168 hours.  Hematology Recent Labs  Lab 03/22/22 1656  WBC 5.2  RBC 3.37*  HGB 10.2*  HCT 31.7*  MCV 94.1  MCH 30.3  MCHC 32.2  RDW 13.6  PLT 157   Thyroid  Recent Labs  Lab 03/23/22 0150  TSH 0.645  FREET4 1.54*    BNP Recent Labs  Lab 03/22/22 2315  BNP 102.4*    DDimer No results for input(s): "DDIMER" in the last 168 hours.   Radiology/Studies:  MR ANGIO NECK WO CONTRAST  Result Date: 03/23/2022 CLINICAL DATA:  Initial evaluation for acute syncope/presyncope. EXAM:  MRA NECK WITHOUT CONTRAST TECHNIQUE: Angiographic images of the neck were acquired using MRA technique without intravenous contrast. Carotid stenosis measurements (when applicable) are obtained utilizing NASCET criteria, using the distal internal carotid diameter as the denominator. COMPARISON:  None Available. FINDINGS: Aortic arch: Visualized aortic arch normal caliber with standard branching pattern. No stenosis about the origin of the great vessels. Right  carotid system: Right common and internal carotid arteries patent without stenosis or evidence for dissection. Left carotid system: Left common and internal carotid arteries patent without stenosis or evidence for dissection. Vertebral arteries: Both vertebral arteries arise from the subclavian arteries. No visible proximal subclavian artery stenosis. Vertebral arteries co dominant and patent without stenosis or dissection. Other: None. IMPRESSION: Normal MRA of the neck. Electronically Signed   By: Jeannine Boga M.D.   On: 03/23/2022 01:46   MR BRAIN WO CONTRAST  Result Date: 03/23/2022 CLINICAL DATA:  Initial evaluation for acute syncope/presyncope. EXAM: [name] HEAD WITHOUT CONTRAST TECHNIQUE: Multiplanar, multiecho pulse sequences of the brain and surrounding structures were obtained without intravenous contrast. COMPARISON:  CT from 03/22/2022. FINDINGS: Brain: Cerebral volume within normal limits for age. Scattered patchy T2/FLAIR hyperintensity involving the periventricular and deep white matter both cerebral hemispheres, most consistent with chronic small vessel ischemic disease, moderate in nature. No evidence for acute or subacute infarct. Gray-white matter differentiation maintained. No encephalomalacia to suggest chronic cortical infarction. No acute or chronic intracranial blood products. No mass lesion, midline shift or mass effect. No hydrocephalus or extra-axial fluid collection. Pituitary gland and suprasellar region within normal limits. Vascular: Major intracranial vascular flow voids are maintained. Skull and upper cervical spine: Craniocervical junction within normal limits. Bone marrow signal intensity normal. No scalp soft tissue abnormality. Sinuses/Orbits: Patient status post ocular lens replacement on the right. Paranasal sinuses are clear. No significant mastoid effusion. Other: None. IMPRESSION: 1. No acute intracranial abnormality. 2. Patchy T2/FLAIR signal abnormality involving  the supratentorial cerebral white matter, most consistent with chronic small vessel ischemic disease. Appearance is moderately advanced in nature. Electronically Signed   By: Jeannine Boga M.D.   On: 03/23/2022 01:44   CT HEAD WO CONTRAST (5MM)  Result Date: 03/22/2022 CLINICAL DATA:  Head trauma, minor (Age >= 65y). EXAM: CT HEAD WITHOUT CONTRAST TECHNIQUE: Contiguous axial images were obtained from the base of the skull through the vertex without intravenous contrast. RADIATION DOSE REDUCTION: This exam was performed according to the departmental dose-optimization program which includes automated exposure control, adjustment of the mA and/or kV according to patient size and/or use of iterative reconstruction technique. COMPARISON:  [id number] FINDINGS: Brain: Mild age related volume loss. No acute intracranial abnormality. Specifically, no hemorrhage, hydrocephalus, mass lesion, acute infarction, or significant intracranial injury. Vascular: No hyperdense vessel or unexpected calcification. Skull: No acute calvarial abnormality. Sinuses/Orbits: No acute findings Other: None IMPRESSION: No acute intracranial abnormality. Electronically Signed   By: Rolm Baptise M.D.   On: 03/22/2022 21:11   CT ABDOMEN PELVIS WO CONTRAST  Result Date: 03/22/2022 CLINICAL DATA:  Abdominal pain, acute, nonlocalized.  Nausea. EXAM: CT ABDOMEN AND PELVIS WITHOUT CONTRAST TECHNIQUE: Multidetector CT imaging of the abdomen and pelvis was performed following the standard protocol without IV contrast. RADIATION DOSE REDUCTION: This exam was performed according to the departmental dose-optimization program which includes automated exposure control, adjustment of the mA and/or kV according to patient size and/or use of iterative reconstruction technique. COMPARISON:  None Available. FINDINGS: Lower chest: No acute abnormality Hepatobiliary: Large cyst in the left hepatic lobe measures  5.2 cm. Smaller scattered subcentimeter cysts  throughout the liver. Gallbladder unremarkable. Pancreas: No focal abnormality or ductal dilatation. Spleen: No focal abnormality.  Normal size. Adrenals/Urinary Tract: 4.2 cm left lower pole renal cyst. No stones or hydronephrosis bilaterally. Adrenal glands and urinary bladder unremarkable. Stomach/Bowel: Moderate stool burden throughout the colon. Normal appendix. Stomach, large and small bowel grossly unremarkable. Vascular/Lymphatic: Aortic atherosclerosis. No evidence of aneurysm or adenopathy. Reproductive: Prior hysterectomy.  No adnexal masses. Other: No free fluid or free air. Musculoskeletal: No acute bony abnormality. IMPRESSION: No acute findings in the abdomen or pelvis. Hepatic and left renal cysts. Aortic atherosclerosis. Moderate stool burden. Electronically Signed   By: Rolm Baptise M.D.   On: 03/22/2022 21:10     Assessment and Plan:   Syncope with collapse not thought to be cardiac related -Patient has been bradycardic for some time without any syncopal episodes -Decrease amiodarone to 100 mg daily -Continue cardiac monitor -No echocardiogram needed at this time -Orthostatic vitals daily -[name] completed -correct electrolyte abnormalities  2.  Sinus bradycardia -Typical for patient -Decrease amiodarone to 100 mg daily -Lab work pending -avoid AV blocking agents as patient was only taking metoprolol PRN  3.  Acute kidney injury -Creatinine on admission 4.29 -Baseline creatinine roughly 2 -With nausea and vomiting in would consider gentle hydration -Daily BMP -If no improvement consider nephrology consult -Avoid nephrotoxic agents  4. Essential hypertension -losartan on hold due to elevated kidney function -continue to monitor -continue hydralazine  5. Hyperlipidemia -continue statin -LDL  6.PAF s/p ablation -continue eliquis 2.5 mg bid -continue amiodarone 100 mg daily -current SB rate 40-50's   Risk Assessment/Risk Scores:          CHA2DS2-VASc Score  = 6   This indicates a 9.7% annual risk of stroke. The patient's score is based upon: CHF History: 1 HTN History: 1 Diabetes History: 0 Stroke History: 0 Vascular Disease History: 1 Age Score: 2 Gender Score: 1         For questions or updates, please contact [place] Please consult [email or web address] for contact info under    Signed, Nalayah Hitt, NP  03/23/2022 11:59 AM

## 2022-03-24 ENCOUNTER — Telehealth: Payer: Self-pay

## 2022-03-24 ENCOUNTER — Ambulatory Visit: Payer: Self-pay

## 2022-03-24 DIAGNOSIS — I480 Paroxysmal atrial fibrillation: Secondary | ICD-10-CM

## 2022-03-24 LAB — BASIC METABOLIC PANEL
Anion gap: 5 (ref 5–15)
BUN: 59 mg/dL — ABNORMAL HIGH (ref 8–23)
CO2: 27 mmol/L (ref 22–32)
Calcium: 9.1 mg/dL (ref 8.9–10.3)
Chloride: 107 mmol/L (ref 98–111)
Creatinine, Ser: 3.23 mg/dL — ABNORMAL HIGH (ref 0.44–1.00)
GFR, Estimated: 14 mL/min — ABNORMAL LOW (ref >60)
Glucose, Bld: 89 mg/dL (ref 70–99)
Potassium: 4.4 mmol/L (ref 3.5–5.1)
Sodium: 139 mmol/L (ref 135–145)

## 2022-03-24 LAB — MAGNESIUM: Magnesium: 2.4 mg/dL (ref 1.7–2.4)

## 2022-03-24 LAB — CBC
HCT: 27.4 % — ABNORMAL LOW (ref 36.0–46.0)
Hemoglobin: 8.8 g/dL — ABNORMAL LOW (ref 12.0–15.0)
MCH: 30.6 pg (ref 26.0–34.0)
MCHC: 32.1 g/dL (ref 30.0–36.0)
MCV: 95.1 fL (ref 80.0–100.0)
Platelets: 120 10*3/uL — ABNORMAL LOW (ref 150–400)
RBC: 2.88 MIL/uL — ABNORMAL LOW (ref 3.87–5.11)
RDW: 13.2 % (ref 11.5–15.5)
WBC: 4.6 10*3/uL (ref 4.0–10.5)
nRBC: 0 % (ref 0.0–0.2)

## 2022-03-24 LAB — CBG MONITORING, ED: Glucose-Capillary: 82 mg/dL (ref 70–99)

## 2022-03-24 MED ORDER — LACTULOSE 10 GM/15ML PO SOLN
20.0000 g | Freq: Once | ORAL | Status: AC
  Administered 2022-03-24: 20 g via ORAL
  Filled 2022-03-24: qty 30

## 2022-03-24 MED ORDER — AMIODARONE HCL 100 MG PO TABS: 100.0000 mg | ORAL_TABLET | Freq: Every day | ORAL | 0 refills | Status: DC

## 2022-03-24 NOTE — Telephone Encounter (Signed)
I spoke with pts husband (DPR signed) pt has had BM since spoke with [place].and feels some better and pt laid down to rest and see how she feels. No abd pain when Mr Hodgkins left home to go to pharmacy.Pts husband is driving right now; pts husband will cb on 03/25/22 with update on how pt is doing and also schedule 1 wk appt for HFU. UC & ED precautions given and pts husband voiced understanding and was appreciative for FU call. Sending note to Dr Diona Browner who is out of office and Butch Penny CMA.

## 2022-03-24 NOTE — Telephone Encounter (Signed)
     Chief Complaint: Nausea, sweating. Sitting commode trying to have bowel movement. Symptoms: Above Frequency: Came home from hospital today. Given Lactulose prior to discharge per husband. Pertinent Negatives: Patient denies chest pain. Disposition: '[]'$ ED /'[]'$ Urgent Care (no appt availability in office) / '[]'$ Appointment(In office/virtual)/ '[]'$  [facility]/ '[]'$ Home Care/ '[]'$ Refused Recommended Disposition /'[]'$ [facility]/ '[x]'$  Follow-up with PCP Additional Notes: Pt. Has Phenergan to take for nausea. Instructed husband to follow up with pt.'s PCP. Verbalizes understanding.  Reason for Disposition  Taking prescription medication that could cause nausea (e.g., narcotics/opiates, antibiotics, OCPs, many others)  Answer Assessment - Initial Assessment Questions 1. NAUSEA SEVERITY: "How bad is the nausea?" (e.g., mild, moderate, severe; dehydration, weight loss)   - MILD: loss of appetite without change in eating habits   - MODERATE: decreased oral intake without significant weight loss, dehydration, or malnutrition   - SEVERE: inadequate caloric or fluid intake, significant weight loss, symptoms of dehydration     Mild 2. ONSET: "When did the nausea begin?"     Today 3. VOMITING: "Any vomiting?" If Yes, ask: "How many times today?"     No 4. RECURRENT SYMPTOM: "Have you had nausea before?" If Yes, ask: "When was the last time?" "What happened that time?"     Yes 5. CAUSE: "What do you think is causing the nausea?"     Hospital gave her Lactulose  6. PREGNANCY: "Is there any chance you are pregnant?" (e.g., unprotected intercourse, missed birth control pill, broken condom)     No  Protocols used: Nausea-A-AH

## 2022-03-24 NOTE — Progress Notes (Signed)
Cardiology Progress Note   Patient Name: [name] Date of Encounter: 03/24/2022  Primary Cardiologist: [name], MD  Subjective   Feels well this AM.  Strength improved.  No chest pain or sob.  Hoping to go home today.  Inpatient Medications    Scheduled Meds:  amiodarone  100 mg Oral Daily   apixaban  2.5 mg Oral BID   hydrALAZINE  50 mg Oral Q8H   lactulose  20 g Oral Once   pantoprazole  40 mg Oral Daily   rosuvastatin  10 mg Oral Daily   senna-docusate  2 tablet Oral BID   sodium chloride flush  3 mL Intravenous Q12H   Continuous Infusions:  PRN Meds: acetaminophen **OR** acetaminophen, promethazine   Vital Signs    Vitals:   03/24/22 0721 03/24/22 0722 03/24/22 0723 03/24/22 0724  BP:   132/62 132/62  Pulse: (!) 44 (!) 43 (!) 44   Resp: '14 12 17   '$ Temp:   98 F (36.7 C)   TempSrc:   Oral   SpO2: 98% 98% 96%   Weight:      Height:        Intake/Output Summary (Last 24 hours) at 03/24/2022 1021 Last data filed at 03/23/2022 1159 Gross per 24 hour  Intake 240 ml  Output 400 ml  Net -160 ml   Filed Weights   03/22/22 1649  Weight: 63.5 kg    Physical Exam   GEN: Thin, frail, in no acute distress.  HEENT: Grossly normal.  Neck: Supple, no JVD, carotid bruits, or masses. Cardiac: RRR, no murmurs, rubs, or gallops. No clubbing, cyanosis, edema.  Radials 2+, DP/PT 2+ and equal bilaterally.  Respiratory:  Respirations regular and unlabored, clear to auscultation bilaterally. GI: Soft, nontender, nondistended, BS + x 4. MS: no deformity or atrophy. Skin: warm and dry, no rash. Neuro:  Strength and sensation are intact. Psych: AAOx3.  Normal affect.  Labs    Chemistry Recent Labs  Lab 03/22/22 1656 03/24/22 0532  NA 137 139  K 4.3 4.4  CL 102 107  CO2 26 27  GLUCOSE 136* 89  BUN 66* 59*  CREATININE 4.29* 3.23*  CALCIUM 9.4 9.1  PROT 7.8  --   ALBUMIN 4.2  --   AST 28  --   ALT 21  --   ALKPHOS 50  --   BILITOT 0.9  --    GFRNONAA 10* 14*  ANIONGAP 9 5     Hematology Recent Labs  Lab 03/22/22 1656 03/24/22 0532  WBC 5.2 4.6  RBC 3.37* 2.88*  HGB 10.2* 8.8*  HCT 31.7* 27.4*  MCV 94.1 95.1  MCH 30.3 30.6  MCHC 32.2 32.1  RDW 13.6 13.2  PLT 157 120*    Cardiac Enzymes  Recent Labs  Lab 03/22/22 1955 03/22/22 2227 03/23/22 0150  TROPONINIHS '6 5 5      '$ BNP    Component Value Date/Time   BNP 102.4 (H) 03/22/2022 2315   Lipids  Lab Results  Component Value Date   CHOL 205 (H) 08/18/2021   HDL 78 08/18/2021   LDLCALC 118 (H) 08/18/2021   LDLDIRECT 104.4 11/26/2012   TRIG 47 08/18/2021   CHOLHDL 2.6 08/18/2021    HbA1c  Lab Results  Component Value Date   HGBA1C 5.3 03/22/2022    Radiology    MR ANGIO NECK WO CONTRAST  Result Date: 03/23/2022 CLINICAL DATA:  Initial evaluation for acute syncope/presyncope. EXAM: MRA NECK WITHOUT CONTRAST  TECHNIQUE: Angiographic images of the neck were acquired using MRA technique without intravenous contrast. Carotid stenosis measurements (when applicable) are obtained utilizing NASCET criteria, using the distal internal carotid diameter as the denominator. COMPARISON:  None Available. FINDINGS: Aortic arch: Visualized aortic arch normal caliber with standard branching pattern. No stenosis about the origin of the great vessels. Right carotid system: Right common and internal carotid arteries patent without stenosis or evidence for dissection. Left carotid system: Left common and internal carotid arteries patent without stenosis or evidence for dissection. Vertebral arteries: Both vertebral arteries arise from the subclavian arteries. No visible proximal subclavian artery stenosis. Vertebral arteries co dominant and patent without stenosis or dissection. Other: None. IMPRESSION: Normal MRA of the neck. Electronically Signed   By: Jeannine Boga M.D.   On: 03/23/2022 01:46   MR BRAIN WO CONTRAST  Result Date: 03/23/2022 CLINICAL DATA:  Initial  evaluation for acute syncope/presyncope. EXAM: [name] HEAD WITHOUT CONTRAST TECHNIQUE: Multiplanar, multiecho pulse sequences of the brain and surrounding structures were obtained without intravenous contrast. COMPARISON:  CT from 03/22/2022. FINDINGS: Brain: Cerebral volume within normal limits for age. Scattered patchy T2/FLAIR hyperintensity involving the periventricular and deep white matter both cerebral hemispheres, most consistent with chronic small vessel ischemic disease, moderate in nature. No evidence for acute or subacute infarct. Gray-white matter differentiation maintained. No encephalomalacia to suggest chronic cortical infarction. No acute or chronic intracranial blood products. No mass lesion, midline shift or mass effect. No hydrocephalus or extra-axial fluid collection. Pituitary gland and suprasellar region within normal limits. Vascular: Major intracranial vascular flow voids are maintained. Skull and upper cervical spine: Craniocervical junction within normal limits. Bone marrow signal intensity normal. No scalp soft tissue abnormality. Sinuses/Orbits: Patient status post ocular lens replacement on the right. Paranasal sinuses are clear. No significant mastoid effusion. Other: None. IMPRESSION: 1. No acute intracranial abnormality. 2. Patchy T2/FLAIR signal abnormality involving the supratentorial cerebral white matter, most consistent with chronic small vessel ischemic disease. Appearance is moderately advanced in nature. Electronically Signed   By: Jeannine Boga M.D.   On: 03/23/2022 01:44   CT HEAD WO CONTRAST (5MM)  Result Date: 03/22/2022 CLINICAL DATA:  Head trauma, minor (Age >= 65y). EXAM: CT HEAD WITHOUT CONTRAST TECHNIQUE: Contiguous axial images were obtained from the base of the skull through the vertex without intravenous contrast. RADIATION DOSE REDUCTION: This exam was performed according to the departmental dose-optimization program which includes automated exposure  control, adjustment of the mA and/or kV according to patient size and/or use of iterative reconstruction technique. COMPARISON:  [id number] FINDINGS: Brain: Mild age related volume loss. No acute intracranial abnormality. Specifically, no hemorrhage, hydrocephalus, mass lesion, acute infarction, or significant intracranial injury. Vascular: No hyperdense vessel or unexpected calcification. Skull: No acute calvarial abnormality. Sinuses/Orbits: No acute findings Other: None IMPRESSION: No acute intracranial abnormality. Electronically Signed   By: Rolm Baptise M.D.   On: 03/22/2022 21:11   CT ABDOMEN PELVIS WO CONTRAST  Result Date: 03/22/2022 CLINICAL DATA:  Abdominal pain, acute, nonlocalized.  Nausea. EXAM: CT ABDOMEN AND PELVIS WITHOUT CONTRAST TECHNIQUE: Multidetector CT imaging of the abdomen and pelvis was performed following the standard protocol without IV contrast. RADIATION DOSE REDUCTION: This exam was performed according to the departmental dose-optimization program which includes automated exposure control, adjustment of the mA and/or kV according to patient size and/or use of iterative reconstruction technique. COMPARISON:  None Available. FINDINGS: Lower chest: No acute abnormality Hepatobiliary: Large cyst in the left hepatic lobe measures 5.2 cm. Smaller scattered  subcentimeter cysts throughout the liver. Gallbladder unremarkable. Pancreas: No focal abnormality or ductal dilatation. Spleen: No focal abnormality.  Normal size. Adrenals/Urinary Tract: 4.2 cm left lower pole renal cyst. No stones or hydronephrosis bilaterally. Adrenal glands and urinary bladder unremarkable. Stomach/Bowel: Moderate stool burden throughout the colon. Normal appendix. Stomach, large and small bowel grossly unremarkable. Vascular/Lymphatic: Aortic atherosclerosis. No evidence of aneurysm or adenopathy. Reproductive: Prior hysterectomy.  No adnexal masses. Other: No free fluid or free air. Musculoskeletal: No acute bony  abnormality. IMPRESSION: No acute findings in the abdomen or pelvis. Hepatic and left renal cysts. Aortic atherosclerosis. Moderate stool burden. Electronically Signed   By: Rolm Baptise M.D.   On: 03/22/2022 21:10    Telemetry    Sinus brady, 40's to 50's.  No prolonged pauses, high grade heart block, or tachyarrhythmias - Personally Reviewed  Cardiac Studies   2D Echocardiogram 3.2023  1. Left ventricular ejection fraction, by estimation, is 60 to 65%. The  left ventricle has normal function. The left ventricle has no regional  wall motion abnormalities. Left ventricular diastolic parameters are  indeterminate.   2. Right ventricular systolic function is normal. The right ventricular  size is normal. There is mildly elevated pulmonary artery systolic  pressure.   3. Left atrial size was severely dilated.   4. Right atrial size was severely dilated.   5. The mitral valve is degenerative. Moderate mitral valve regurgitation.   6. Tricuspid valve regurgitation is mild to moderate.   7. The aortic valve is grossly normal. Aortic valve regurgitation is mild  to moderate. Aortic valve sclerosis/calcification is present, without any  evidence of aortic stenosis.   8. The inferior vena cava is normal in size with greater than 50%  respiratory variability, suggesting right atrial pressure of 3 mmHg.  _____________   Patient Profile     81 y.o. female with a hx of persistent atrial fibrillation status post ablation, chronic bradycardia, CKD 3, mitral regurgitation, aortic regurgitation, essential hypertension, and hyperlipidemia   Assessment & Plan    1.  Presyncope:  Feeling poorly x several weeks w/ nausea and poor PO intake.  Using phenergan in outpt setting.  Thinks that symptoms coincided with starting lasix for lower ext edema.  On 6/13, felt very tired and presyncopal - denies losing consciousness.  Bradycardic since admission w/o high grade heart block or prolonged pauses.  HsTrops  nl.  BUN/Creat elevated above prior baseline @ 66/4.29 on 6/13 (was 45/2.85 on 02/22/22).  [name] w/ small vessel dzs, otw no acute changes. Suspect poor PO intake/AKI is the etiology of symptoms on presentation.  Despite ongoing brady, she is asymptomatic this AM.  Ambulate prior to discharge to assess for chronotropic competence.  Avoid ARB and diuretic for the time being.  2.  Persistent Afib:  s/p PVI in 2013 w/ more recent recurrence requiring initiation of amio.  Maintaining sinus rhythm on amio 100 daily.  No other AVN blocking agents in the setting of chronic sinus brady.  May need to consider reducing amio to '100mg'$  qod. Cont eliquis 2.'5mg'$  BID (creat/borderline wt).  3.  Sinus bradycardia:  H/o sinus brady on amio, though rates typically 50's to 60 at outpt office visits.  Rates 40's to 50's overnight w/o high grade heart block or pauses.  Ambulate to assess chronotropic competence.  As above, may need to reduce amio to '100mg'$  qod.  4.  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  59/3.23 this AM.  If d/c'd today, will need early outpt f/u w/ PCP or nephrology for f/u.  Cont to avoid ARB/lasix for now.  5.  Moderate MR:  stable by echo in 12/2021.  6.  Essential HTN:  followed in HTN clinic in [place].  BPs currently stable on home dose of hydralazine.  Home doses of losartan and lasix currently on hold in setting of #4.  7.  Normocytic anemia:  acute on chronic. Follow.  Signed, Murray Hodgkins, NP  03/24/2022, 10:21 AM    For questions or updates, please contact   Please consult [email or web address] for contact info under Cardiology/STEMI.

## 2022-03-24 NOTE — Telephone Encounter (Signed)
Transition Care Management Unsuccessful Follow-up Telephone Call  Date of discharge and from where:  03/24/22 [facility]  Attempts:  1st Attempt  Reason for unsuccessful TCM follow-up call:  Unable to leave message

## 2022-03-24 NOTE — Discharge Summary (Signed)
Physician Discharge Summary   Patient: [name] MRN: [id number] DOB: [birth year]  Admit date:     03/22/2022  Discharge date: 03/24/22  Discharge Physician: Sharen Hones   PCP: [name], MD   Recommendations at discharge:   Follow-up with PCP in 1 week. Follow-up with cardiology in 1 week  Discharge Diagnoses: Principal Problem:   Syncope and collapse Active Problems:   SINUS BRADYCARDIA   Abdominal pain   HYPERLIPIDEMIA   Anemia of chronic disease   Aortic valve sclerosis   Coronary artery disease, non-occlusive   PAF (paroxysmal atrial fibrillation) (HCC)   Status post ablation of atrial fibrillation   Essential hypertension   Anemia of chronic renal failure, stage 3b (HCC)   Acute kidney injury superimposed on chronic kidney disease ([place])  Resolved Problems:   * No resolved hospital problems. *  Hospital Course: [name] is a 81 y.o. female with medical history significant of anemia, heart disease, hypertension, paroxysmal atrial fibrillation, bradycardia coming in with complaints of abdominal pain.  Patient has some nausea and poor appetite.  She also has some constipation. She had a near syncope episode yesterday when she was walking from the restroom to her bedroom.  She was dizzy, she collapsed at the side of the bed, she never lost consciousness. She was started on furosemide about a month ago, since that time, she has not been feeling well.  She has intermittent dizziness, she has a poor appetite, she also had increased weakness. Upon arriving the hospital, she was found to have sinus bradycardia, which is chronic.  She also had acute on chronic renal failure.  She is treated with IV fluids.  Assessment and Plan: Near syncope secondary to dehydration. 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 This appears to be secondary to side effect from medication.  Patient does not have any volume overload.  She appears to be dehydrated.   Renal function at baseline with 2.0, increased to 4.29. Patient received gentle rehydration, creatinine has dropped down to 3.2.  I will continue hold off Lasix, anticipating further drop of creatinine with the Lasix on hold.  Patient be follow-up with cardiology in the near future.  Patient also will be followed by her nephrologist as scheduled. Also hold off ARB.   Paroxysmal atrial fibrillation. Sinus bradycardia. Discussed with cardiology, patient has chronic bradycardia, seems to be at baseline.  Amiodarone dose is decreased.  Patient will be followed by her cardiologist in the near future.  Essential hypertension. Due to worsening renal function, Cozaar is on hold.   Coronary artery disease. Appear to be stable.  Abdominal pain. Condition appears to be secondary to constipation.  Patient will be giving a dose of lactulose before discharge.       Consultants: Cardiology Procedures performed: None  Disposition: Home Diet recommendation:  Discharge Diet Orders (From admission, onward)     Start     Ordered   03/24/22 0000  Diet - low sodium heart healthy        03/24/22 0947           Cardiac diet DISCHARGE MEDICATION: Allergies as of 03/24/2022       Reactions   Amlodipine    Ankle swelling   Celecoxib Other (See Comments), Rash   Tachycardia/palpitations Other reaction(s): Other Tachycardia/palpitations Tachycardia/palpitations   Tramadol Nausea Only        Medication List     STOP taking these medications    furosemide 40 MG  tablet Commonly known as: LASIX   losartan 100 MG tablet Commonly known as: COZAAR   potassium chloride SA 20 MEQ tablet Commonly known as: KLOR-CON M   Vitamin B Complex Tabs       TAKE these medications    acetaminophen 325 MG tablet Commonly known as: TYLENOL Take 325 mg by mouth every 6 (six) hours as needed for mild pain.   amiodarone 100 MG tablet Commonly known as: PACERONE Take 1 tablet (100 mg total) by  mouth daily. What changed:  medication strength how much to take how to take this when to take this additional instructions   apixaban 2.5 MG Tabs tablet Commonly known as: Eliquis Take 1 tablet (2.5 mg total) by mouth 2 (two) times daily.   D 1000 25 MCG (1000 UT) capsule Generic drug: Cholecalciferol TAKE 1 CAPSULE BY MOUTH AT BEDTIME.   hydrALAZINE 50 MG tablet Commonly known as: APRESOLINE Take 1 tablet (50 mg total) by mouth every 8 (eight) hours.   pantoprazole 40 MG tablet Commonly known as: PROTONIX Take 1 tablet (40 mg total) by mouth daily.   promethazine 25 MG tablet Commonly known as: PHENERGAN Take 1 tablet (25 mg total) by mouth every 8 (eight) hours as needed for nausea or vomiting.   rosuvastatin 10 MG tablet Commonly known as: CRESTOR TAKE 1 TABLET BY MOUTH ONCE DAILY        Follow-up Information     Bedsole, Amy E, MD Follow up in 1 week(s).   Specialty: Family Medicine Contact information: [place] [address] [phone]         [name], MD Follow up in 1 week(s).   Specialty: Cardiology Contact information: [address] [place] [address] [phone]                Discharge Exam: Danley Danker Weights   03/22/22 1649  Weight: 63.5 kg   General exam: Appears calm and comfortable  Respiratory system: Clear to auscultation. Respiratory effort normal. Cardiovascular system: Regular and bradycardic. No JVD, murmurs, rubs, gallops or clicks. No pedal edema. Gastrointestinal system: Abdomen is nondistended, soft and nontender. No organomegaly or masses felt. Normal bowel sounds heard. Central nervous system: Alert and oriented. No focal neurological deficits. Extremities: Symmetric 5 x 5 power. Skin: No rashes, lesions or ulcers Psychiatry: Judgement and insight appear normal. Mood & affect appropriate.    Condition at discharge: good  The results of significant diagnostics from  this hospitalization (including imaging, microbiology, ancillary and laboratory) are listed below for reference.   Imaging Studies: MR ANGIO NECK WO CONTRAST  Result Date: 03/23/2022 CLINICAL DATA:  Initial evaluation for acute syncope/presyncope. EXAM: MRA NECK WITHOUT CONTRAST TECHNIQUE: Angiographic images of the neck were acquired using MRA technique without intravenous contrast. Carotid stenosis measurements (when applicable) are obtained utilizing NASCET criteria, using the distal internal carotid diameter as the denominator. COMPARISON:  None Available. FINDINGS: Aortic arch: Visualized aortic arch normal caliber with standard branching pattern. No stenosis about the origin of the great vessels. Right carotid system: Right common and internal carotid arteries patent without stenosis or evidence for dissection. Left carotid system: Left common and internal carotid arteries patent without stenosis or evidence for dissection. Vertebral arteries: Both vertebral arteries arise from the subclavian arteries. No visible proximal subclavian artery stenosis. Vertebral arteries co dominant and patent without stenosis or dissection. Other: None. IMPRESSION: Normal MRA of the neck. Electronically Signed   By: Pincus Badder.D.  On: 03/23/2022 01:46   MR BRAIN WO CONTRAST  Result Date: 03/23/2022 CLINICAL DATA:  Initial evaluation for acute syncope/presyncope. EXAM: [name] HEAD WITHOUT CONTRAST TECHNIQUE: Multiplanar, multiecho pulse sequences of the brain and surrounding structures were obtained without intravenous contrast. COMPARISON:  CT from 03/22/2022. FINDINGS: Brain: Cerebral volume within normal limits for age. Scattered patchy T2/FLAIR hyperintensity involving the periventricular and deep white matter both cerebral hemispheres, most consistent with chronic small vessel ischemic disease, moderate in nature. No evidence for acute or subacute infarct. Gray-white matter differentiation maintained. No  encephalomalacia to suggest chronic cortical infarction. No acute or chronic intracranial blood products. No mass lesion, midline shift or mass effect. No hydrocephalus or extra-axial fluid collection. Pituitary gland and suprasellar region within normal limits. Vascular: Major intracranial vascular flow voids are maintained. Skull and upper cervical spine: Craniocervical junction within normal limits. Bone marrow signal intensity normal. No scalp soft tissue abnormality. Sinuses/Orbits: Patient status post ocular lens replacement on the right. Paranasal sinuses are clear. No significant mastoid effusion. Other: None. IMPRESSION: 1. No acute intracranial abnormality. 2. Patchy T2/FLAIR signal abnormality involving the supratentorial cerebral white matter, most consistent with chronic small vessel ischemic disease. Appearance is moderately advanced in nature. Electronically Signed   By: Jeannine Boga M.D.   On: 03/23/2022 01:44   CT HEAD WO CONTRAST (5MM)  Result Date: 03/22/2022 CLINICAL DATA:  Head trauma, minor (Age >= 65y). EXAM: CT HEAD WITHOUT CONTRAST TECHNIQUE: Contiguous axial images were obtained from the base of the skull through the vertex without intravenous contrast. RADIATION DOSE REDUCTION: This exam was performed according to the departmental dose-optimization program which includes automated exposure control, adjustment of the mA and/or kV according to patient size and/or use of iterative reconstruction technique. COMPARISON:  [id number] FINDINGS: Brain: Mild age related volume loss. No acute intracranial abnormality. Specifically, no hemorrhage, hydrocephalus, mass lesion, acute infarction, or significant intracranial injury. Vascular: No hyperdense vessel or unexpected calcification. Skull: No acute calvarial abnormality. Sinuses/Orbits: No acute findings Other: None IMPRESSION: No acute intracranial abnormality. Electronically Signed   By: Rolm Baptise M.D.   On: 03/22/2022 21:11   CT  ABDOMEN PELVIS WO CONTRAST  Result Date: 03/22/2022 CLINICAL DATA:  Abdominal pain, acute, nonlocalized.  Nausea. EXAM: CT ABDOMEN AND PELVIS WITHOUT CONTRAST TECHNIQUE: Multidetector CT imaging of the abdomen and pelvis was performed following the standard protocol without IV contrast. RADIATION DOSE REDUCTION: This exam was performed according to the departmental dose-optimization program which includes automated exposure control, adjustment of the mA and/or kV according to patient size and/or use of iterative reconstruction technique. COMPARISON:  None Available. FINDINGS: Lower chest: No acute abnormality Hepatobiliary: Large cyst in the left hepatic lobe measures 5.2 cm. Smaller scattered subcentimeter cysts throughout the liver. Gallbladder unremarkable. Pancreas: No focal abnormality or ductal dilatation. Spleen: No focal abnormality.  Normal size. Adrenals/Urinary Tract: 4.2 cm left lower pole renal cyst. No stones or hydronephrosis bilaterally. Adrenal glands and urinary bladder unremarkable. Stomach/Bowel: Moderate stool burden throughout the colon. Normal appendix. Stomach, large and small bowel grossly unremarkable. Vascular/Lymphatic: Aortic atherosclerosis. No evidence of aneurysm or adenopathy. Reproductive: Prior hysterectomy.  No adnexal masses. Other: No free fluid or free air. Musculoskeletal: No acute bony abnormality. IMPRESSION: No acute findings in the abdomen or pelvis. Hepatic and left renal cysts. Aortic atherosclerosis. Moderate stool burden. Electronically Signed   By: Rolm Baptise M.D.   On: 03/22/2022 21:10    Microbiology: Results for orders placed or performed during the hospital encounter of 08/17/21  Resp Panel by RT-PCR (Flu A&B, Covid) Nasopharyngeal Swab     Status: None   Collection Time: 08/17/21  2:55 PM   Specimen: Nasopharyngeal Swab; Nasopharyngeal(NP) swabs in vial transport medium  Result Value Ref Range Status   SARS Coronavirus 2 by RT PCR NEGATIVE NEGATIVE  Final    Comment: (NOTE) SARS-CoV-2 target nucleic acids are NOT DETECTED.  The SARS-CoV-2 RNA is generally detectable in upper respiratory specimens during the acute phase of infection. The lowest concentration of SARS-CoV-2 viral copies this assay can detect is 138 copies/mL. A negative result does not preclude SARS-Cov-2 infection and should not be used as the sole basis for treatment or other patient management decisions. A negative result may occur with  improper specimen collection/handling, submission of specimen other than nasopharyngeal swab, presence of viral mutation(s) within the areas targeted by this assay, and inadequate number of viral copies(<138 copies/mL). A negative result must be combined with clinical observations, patient history, and epidemiological information. The expected result is Negative.  Fact Sheet for Patients:  EntrepreneurPulse.com.au  Fact Sheet for Healthcare Providers:  IncredibleEmployment.be  This test is no t yet approved or cleared by the [place] FDA and  has been authorized for detection and/or diagnosis of SARS-CoV-2 by FDA under an Emergency Use Authorization (EUA). This EUA will remain  in effect (meaning this test can be used) for the duration of the COVID-19 declaration under Section 564(b)(1) of the Act, 21 U.S.C.section 360bbb-3(b)(1), unless the authorization is terminated  or revoked sooner.       Influenza A by PCR NEGATIVE NEGATIVE Final   Influenza B by PCR NEGATIVE NEGATIVE Final    Comment: (NOTE) The Xpert Xpress SARS-CoV-2/FLU/RSV plus assay is intended as an aid in the diagnosis of influenza from Nasopharyngeal swab specimens and should not be used as a sole basis for treatment. Nasal washings and aspirates are unacceptable for Xpert Xpress SARS-CoV-2/FLU/RSV testing.  Fact Sheet for Patients: EntrepreneurPulse.com.au  Fact Sheet for Healthcare  Providers: IncredibleEmployment.be  This test is not yet approved or cleared by the [place] FDA and has been authorized for detection and/or diagnosis of SARS-CoV-2 by FDA under an Emergency Use Authorization (EUA). This EUA will remain in effect (meaning this test can be used) for the duration of the COVID-19 declaration under Section 564(b)(1) of the Act, 21 U.S.C. section 360bbb-3(b)(1), unless the authorization is terminated or revoked.  Performed at [facility], [address] [place], [place] [address]     Labs: CBC: Recent Labs  Lab 03/22/22 1656 03/24/22 0532  WBC 5.2 4.6  HGB 10.2* 8.8*  HCT 31.7* 27.4*  MCV 94.1 95.1  PLT 157 767*   Basic Metabolic Panel: Recent Labs  Lab 03/22/22 1656 03/24/22 0532  NA 137 139  K 4.3 4.4  CL 102 107  CO2 26 27  GLUCOSE 136* 89  BUN 66* 59*  CREATININE 4.29* 3.23*  CALCIUM 9.4 9.1  MG  --  2.4   Liver Function Tests: Recent Labs  Lab 03/22/22 1656  AST 28  ALT 21  ALKPHOS 50  BILITOT 0.9  PROT 7.8  ALBUMIN 4.2   CBG: Recent Labs  Lab 03/23/22 0430 03/23/22 0720 03/23/22 1628 03/24/22 0732  GLUCAP 93 97 99 82    Discharge time spent: greater than 30 minutes.  Signed: Sharen Hones, MD [facility] 03/24/2022

## 2022-03-24 NOTE — ED Notes (Signed)
Patient resting in bed with eyes closed, patient family member at bedside resting as well. Patient denies any needs at this time.

## 2022-03-25 ENCOUNTER — Inpatient Hospital Stay: Admission: RE | Admit: 2022-03-25 | Payer: Medicare Other | Source: Ambulatory Visit

## 2022-03-25 NOTE — Telephone Encounter (Signed)
Transition Care Management Unsuccessful Follow-up Telephone Call  Date of discharge and from where:  03/24/22 [facility]  Attempts:  2nd Attempt  Reason for unsuccessful TCM follow-up call:  Unable to leave message

## 2022-03-28 ENCOUNTER — Telehealth (HOSPITAL_BASED_OUTPATIENT_CLINIC_OR_DEPARTMENT_OTHER): Payer: Self-pay | Admitting: Cardiovascular Disease

## 2022-03-28 NOTE — Telephone Encounter (Signed)
Patient with questions regarding Amiodarone and nausea. Will route to [facility] team for EP to evaluate.   Evaluated by [name] SSS with bradycardia on metoprolol as well as persistent atrial fibrillation. She was started on Amiodarone 10/12/21.   ED visit 03/22/22 with AK in setting of nausea, vomiting, weakness. Amiodarone reduced to '100mg'$  QD.  Of note, has upcoming appt tomorrow with Oda Kilts, Mitchell him as FYI.  Loel Dubonnet, NP

## 2022-03-28 NOTE — Progress Notes (Unsigned)
PCP:  [name], MD Primary Cardiologist: [name], MD Electrophysiologist: Will Meredith Leeds, MD   [name] is a 81 y.o. female seen today for Will Meredith Leeds, MD for post hospital follow up.   Admitted 6/13 - 6/15 with near syncope and AKI. Noted to have bradycardia at times. Amiodarone decreased in setting of nausea as well as bradyacardia. Pt states she was seen by neprhology and is to follow up, but has not yet made an appointment.   Today, she reports feeling "much better". She still has some decreased appetite and complains of everything "tasting bad". Supplementing with Boost/Ensure. No chest pain, SOB, syncope, or edema. Mild lightheadedness with rapid standing. Nausea has improved.   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 cath 2/09: no CAD, EF 70%   GERD (gastroesophageal reflux disease) 07/26/2021   HYPERLIPIDEMIA    HYPERTENSION    Mild Aortic insufficiency    Moderate mitral regurgitation    Moderate tricuspid regurgitation    OSTEOPENIA    PAF (paroxysmal atrial fibrillation) ([place])    a. s/p ablation 2013; b. CHADS2VASc -> 4 (HTN, age x 2, female)-->Eliquis.   PAT (paroxysmal atrial tachycardia) ([place])    a. 04/2019 Zio: Occas PACs and rare PVCs. 21 atrial runs - longest 20 beats, max rate 169.   Pneumonia 2009   RA (rheumatoid arthritis) (HCC)    Sinus Bradycardia    a. asymptomatic but prevents use of AVN blocking agents; b. 04/2019 Zio: Avg HR 61 (37-109).   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 05/2019 Echo: EF 60-65%. DD. RVSP 41.66mHg. Mod dil LA. Mod MR/TR. Mild AI.   Past Surgical History:  Procedure Laterality Date   ABLATION OF DYSRHYTHMIC FOCUS     CARDIAC CATHETERIZATION     COLONOSCOPY  04/18/08   Partial hysterectomy--1979     Thoracentesis   12/20/07       Current Outpatient Medications  Medication Sig Dispense Refill   acetaminophen (TYLENOL) 325 MG tablet Take 325 mg by mouth every 6 (six) hours as needed for mild pain.     amiodarone (PACERONE) 200 MG tablet Take 100 mg by mouth daily.     apixaban (ELIQUIS) 2.5 MG TABS tablet Take 1 tablet (2.5 mg total) by mouth 2 (two) times daily. 180 tablet 3   D 1000 25 MCG (1000 UT) capsule TAKE 1 CAPSULE BY MOUTH AT BEDTIME. 30 capsule 3   hydrALAZINE (APRESOLINE) 50 MG tablet Take 1 tablet (50 mg total) by mouth every 8 (eight) hours. 270 tablet 3   pantoprazole (PROTONIX) 40 MG tablet Take 1 tablet (40 mg total) by mouth daily. 30 tablet 11   potassium chloride SA (KLOR-CON M) 20 MEQ tablet Take 20 mEq by mouth daily.     promethazine (PHENERGAN) 25 MG tablet Take 1 tablet (25 mg total) by mouth every 8 (eight) hours as needed for nausea or vomiting. 20 tablet 0   rosuvastatin (CRESTOR) 10 MG tablet TAKE 1 TABLET BY MOUTH ONCE DAILY 90 tablet 2   No current facility-administered medications for this visit.    Allergies  Allergen Reactions   Amlodipine     Ankle swelling   Celecoxib Other (See Comments) and Rash    Tachycardia/palpitations Other reaction(s): Other Tachycardia/palpitations Tachycardia/palpitations  Tramadol Nausea Only    Social History   Socioeconomic History   Marital status: Married    Spouse name: Not on file   Number of children: Not on file   Years of education: Not on file   Highest education level: Not on file  Occupational History   Not on file  Tobacco Use   Smoking status: Never   Smokeless tobacco: Never  Vaping Use   Vaping Use: Never used  Substance and Sexual Activity   Alcohol use: No    Alcohol/week: 0.0 standard drinks of alcohol   Drug use: No   Sexual activity: Never    Birth control/protection: None  Other Topics Concern   Not on file  Social History Narrative   Marital Status: widow x 1 yrChildren: 73, grandchildren 5, numerous  great grand 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 Full Code ( reviewed 2015) Occasional exercise.Diet: fruits and veggies, lean meats.   Right handed    Caffeine- none    Social Determinants of Health   Financial Resource Strain: Not on file  Food Insecurity: Not on file  Transportation Needs: Not on file  Physical Activity: Not on file  Stress: Not on file  Social Connections: Not on file  Intimate Partner Violence: Not on file     Review of Systems: All other systems reviewed and are otherwise negative except as noted above.  Physical Exam: Vitals:   03/29/22 1117  BP: (!) 124/58  Pulse: 68  SpO2: 97%  Weight: 139 lb 12.8 oz (63.4 kg)  Height: '5\' 2"'$  (1.575 m)    GEN- The patient is well appearing, alert and oriented x 3 today.   HEENT: normocephalic, atraumatic; sclera clear, conjunctiva pink; hearing intact; oropharynx clear; neck supple, no JVP Lymph- no cervical lymphadenopathy Lungs- Clear to ausculation bilaterally, normal work of breathing.  No wheezes, rales, rhonchi Heart- Regular rate and rhythm, no murmurs, rubs or gallops, PMI not laterally displaced GI- soft, non-tender, non-distended, bowel sounds present, no hepatosplenomegaly Extremities- no clubbing, cyanosis, or edema; DP/PT/radial pulses 2+ bilaterally MS- no significant deformity or atrophy Skin- warm and dry, no rash or lesion Psych- euthymic mood, full affect Neuro- strength and sensation are intact  EKG is not ordered. Personal review of EKG from  03/22/2022  shows sinus bradycardia at 47 bpm  Additional studies reviewed include: Previous EP and AF notes.   Assessment and Plan:  1. Persistent atrial fibrillation Slow and regular  on exam today. Sinus brady by EKG last week Continue amiodarone 100 mg daily, given renal function, she does not have other options for AAD. Continue eliquis 2.5 mg BID for CHA2DS2VASc of at least 4. Decision was made to decrease by Cr and borderline weight. If her weight remains stable or increase, would consider going back on 5 mg BID  2. HTN Stable on current regimen   3. SSS Continue to follow conservatively. Average HR in the 50s by previous monitoring.  No symptoms.   4. Recent pre-syncope/ AKI Felt to be very tired and pre-syncopal in setting of starting lasix for LE edema She is worried amiodarone is contributing to nausea, though it has improved significantly since admission, still on amio.  Given no other option for AAD and per tolerance of AF, she wishes to continue for now.   Follow up with [name] in  3 months   Shirley Friar, PA-C  03/29/22 11:35 AM

## 2022-03-28 NOTE — Telephone Encounter (Signed)
Followed up, advised to keep appt tomorrow morning. Dtr verbalized understanding and agreeable to plan.

## 2022-03-28 NOTE — Telephone Encounter (Signed)
Pt c/o medication issue:  1. Name of Medication: amiodarone (PACERONE) 100 MG tablet  2. How are you currently taking this medication (dosage and times per day)? As prescribed   3. Are you having a reaction (difficulty breathing--STAT)? No   4. What is your medication issue? Daughter calling stating they believe this medication is the cause of the patient's nausea and no appetite. Please advise.

## 2022-03-29 ENCOUNTER — Other Ambulatory Visit: Payer: Medicare Other

## 2022-03-29 ENCOUNTER — Encounter: Payer: Self-pay | Admitting: Student

## 2022-03-29 ENCOUNTER — Ambulatory Visit: Payer: Medicare Other | Admitting: Student

## 2022-03-29 VITALS — BP 124/58 | HR 68 | Ht 62.0 in | Wt 139.8 lb

## 2022-03-29 DIAGNOSIS — I4819 Other persistent atrial fibrillation: Secondary | ICD-10-CM

## 2022-03-29 DIAGNOSIS — I10 Essential (primary) hypertension: Secondary | ICD-10-CM

## 2022-03-29 DIAGNOSIS — I2510 Atherosclerotic heart disease of native coronary artery without angina pectoris: Secondary | ICD-10-CM

## 2022-03-29 DIAGNOSIS — I495 Sick sinus syndrome: Secondary | ICD-10-CM

## 2022-03-29 NOTE — Patient Instructions (Signed)
Medication Instructions:  Your physician recommends that you continue on your current medications as directed. Please refer to the Current Medication list given to you today.  *If you need a refill on your cardiac medications before your next appointment, please call your pharmacy*   Lab Work: TODAY: BMET, CBC  If you have labs (blood work) drawn today and your tests are completely normal, you will receive your results only by: [place] (if you have MyChart) OR A paper copy in the mail If you have any lab test that is abnormal or we need to change your treatment, we will call you to review the results.  Follow-Up: At [facility], you and your health needs are our priority.  As part of our continuing mission to provide you with exceptional heart care, we have created designated Provider Care Teams.  These Care Teams include your primary Cardiologist (physician) and Advanced Practice Providers (APPs -  Physician Assistants and Nurse Practitioners) who all work together to provide you with the care you need, when you need it.  Your next appointment:   3 month(s)  The format for your next appointment:   In Person  Provider:   Allegra Lai, MD{

## 2022-03-30 LAB — CBC
Hematocrit: 33.4 % — ABNORMAL LOW (ref 34.0–46.6)
Hemoglobin: 10.9 g/dL — ABNORMAL LOW (ref 11.1–15.9)
MCH: 30.7 pg (ref 26.6–33.0)
MCHC: 32.6 g/dL (ref 31.5–35.7)
MCV: 94 fL (ref 79–97)
Platelets: 177 10*3/uL (ref 150–450)
RBC: 3.55 x10E6/uL — ABNORMAL LOW (ref 3.77–5.28)
RDW: 13.4 % (ref 11.7–15.4)
WBC: 5.7 10*3/uL (ref 3.4–10.8)

## 2022-03-30 LAB — BASIC METABOLIC PANEL
BUN/Creatinine Ratio: 14 (ref 12–28)
BUN: 32 mg/dL — ABNORMAL HIGH (ref 8–27)
CO2: 19 mmol/L — ABNORMAL LOW (ref 20–29)
Calcium: 10.2 mg/dL (ref 8.7–10.3)
Chloride: 108 mmol/L — ABNORMAL HIGH (ref 96–106)
Creatinine, Ser: 2.25 mg/dL — ABNORMAL HIGH (ref 0.57–1.00)
Glucose: 121 mg/dL — ABNORMAL HIGH (ref 70–99)
Potassium: 4.4 mmol/L (ref 3.5–5.2)
Sodium: 145 mmol/L — ABNORMAL HIGH (ref 134–144)
eGFR: 22 mL/min/{1.73_m2} — ABNORMAL LOW (ref >59)

## 2022-04-05 ENCOUNTER — Ambulatory Visit (INDEPENDENT_AMBULATORY_CARE_PROVIDER_SITE_OTHER): Payer: Medicare Other | Admitting: Family Medicine

## 2022-04-05 VITALS — BP 150/70 | HR 50 | Temp 97.5°F | Ht 63.0 in | Wt 140.5 lb

## 2022-04-05 DIAGNOSIS — D638 Anemia in other chronic diseases classified elsewhere: Secondary | ICD-10-CM | POA: Diagnosis not present

## 2022-04-05 DIAGNOSIS — I10 Essential (primary) hypertension: Secondary | ICD-10-CM

## 2022-04-05 DIAGNOSIS — R110 Nausea: Secondary | ICD-10-CM | POA: Diagnosis not present

## 2022-04-05 DIAGNOSIS — N189 Chronic kidney disease, unspecified: Secondary | ICD-10-CM | POA: Diagnosis not present

## 2022-04-05 DIAGNOSIS — K5900 Constipation, unspecified: Secondary | ICD-10-CM | POA: Diagnosis not present

## 2022-04-05 DIAGNOSIS — I4819 Other persistent atrial fibrillation: Secondary | ICD-10-CM | POA: Diagnosis not present

## 2022-04-05 DIAGNOSIS — N179 Acute kidney failure, unspecified: Secondary | ICD-10-CM | POA: Diagnosis not present

## 2022-04-05 DIAGNOSIS — R519 Headache, unspecified: Secondary | ICD-10-CM

## 2022-04-05 NOTE — Progress Notes (Signed)
Patient ID: [name], female    DOB: 09-Jun-1941, 81 y.o.   MRN: [id number]  This visit was conducted in person.  BP (!) 150/70   Pulse (!) 50   Temp (!) 97.5 F (36.4 C) (Temporal)   Ht 5' 3"  (1.6 m)   Wt 140 lb 8 oz (63.7 kg)   SpO2 97%   BMI 24.89 kg/m    CC:  Chief Complaint  Patient presents with   ER follow up    Subjective:   HPI: [name] is a 81 y.o. female presenting on 04/05/2022 for ER follow up   Admitted from 6/13-6/15 with near syncope and acute kidney insufficiency.   Cr had decline from 2.84  to 4.29. Cardiac enzymes were negative, CT abdomen pelvis and head were normal.   After IV fluids and by the day of discharge her creatinine had returned to 2.25 She was noted to have bradycardia at times.  Her amiodarone was decreased in the setting of nausea as well as the bradycardia. A referral was placed for nephrology but she has yet to make an appointment.  She saw cardiology on 03/29/2022, Barrington Ellison, PA Note reviewed in detail. She was continued on amiodarone 100 mg p.o. daily. She has limited options given her decreased renal function.  She was continued on Eliquis 2.5 mg twice daily. Recent presyncope was likely felt to be due to starting Lasix for lower extremity edema. She has follow-up with [name] in 3 months  Also had a hospitalization on 03/21/2022 she was seen by neurology for chronic headaches and it was felt to likely be possible postconcussive syndrome.  [name] brain was ordered.  She was also referred to speech therapy for cognitive rehabilitation.  Supplements including magnesium, B12, co-Q10 and melatonin were recommended.  Today she reports she had feeling better but then yesterday felt weak. Nauseous. Stayed in bed.     Reviewed CT abd: showed moderate  stool burden.     Has lost 10 lbs in last several months.  Wt Readings from Last 3 Encounters:  04/05/22 140 lb 8 oz (63.7 kg)  03/29/22 139 lb 12.8 oz (63.4 kg)   03/22/22 140 lb (63.5 kg)     Relevant past medical, surgical, family and social history reviewed and updated as indicated. Interim medical history since our last visit reviewed. Allergies and medications reviewed and updated. Outpatient Medications Prior to Visit  Medication Sig Dispense Refill   acetaminophen (TYLENOL) 325 MG tablet Take 325 mg by mouth every 6 (six) hours as needed for mild pain.     amiodarone (PACERONE) 200 MG tablet Take 100 mg by mouth daily.     apixaban (ELIQUIS) 2.5 MG TABS tablet Take 1 tablet (2.5 mg total) by mouth 2 (two) times daily. 180 tablet 3   hydrALAZINE (APRESOLINE) 50 MG tablet Take 1 tablet (50 mg total) by mouth every 8 (eight) hours. 270 tablet 3   pantoprazole (PROTONIX) 40 MG tablet Take 1 tablet (40 mg total) by mouth daily. 30 tablet 11   potassium chloride SA (KLOR-CON M) 20 MEQ tablet Take 20 mEq by mouth daily.     promethazine (PHENERGAN) 25 MG tablet Take 1 tablet (25 mg total) by mouth every 8 (eight) hours as needed for nausea or vomiting. 20 tablet 0   rosuvastatin (CRESTOR) 10 MG tablet TAKE 1 TABLET BY MOUTH ONCE DAILY 90 tablet 2   D 1000 25 MCG (1000 UT) capsule TAKE 1 CAPSULE BY  MOUTH AT BEDTIME. (Patient not taking: Reported on 04/05/2022) 30 capsule 3   No facility-administered medications prior to visit.     Per HPI unless specifically indicated in ROS section below Review of Systems  Gastrointestinal:  Positive for nausea.  Neurological:  Positive for weakness.   Objective:  BP (!) 150/70   Pulse (!) 50   Temp (!) 97.5 F (36.4 C) (Temporal)   Ht 5' 3"  (1.6 m)   Wt 140 lb 8 oz (63.7 kg)   SpO2 97%   BMI 24.89 kg/m   Wt Readings from Last 3 Encounters:  04/05/22 140 lb 8 oz (63.7 kg)  03/29/22 139 lb 12.8 oz (63.4 kg)  03/22/22 140 lb (63.5 kg)      Physical Exam Constitutional:      General: She is not in acute distress.    Appearance: Normal appearance. She is well-developed. She is not ill-appearing or  toxic-appearing.  HENT:     Head: Normocephalic.     Right Ear: Hearing, tympanic membrane, ear canal and external ear normal. Tympanic membrane is not erythematous, retracted or bulging.     Left Ear: Hearing, tympanic membrane, ear canal and external ear normal. Tympanic membrane is not erythematous, retracted or bulging.     Nose: No mucosal edema or rhinorrhea.     Right Sinus: No maxillary sinus tenderness or frontal sinus tenderness.     Left Sinus: No maxillary sinus tenderness or frontal sinus tenderness.     Mouth/Throat:     Pharynx: Uvula midline.  Eyes:     General: Lids are normal. Lids are everted, no foreign bodies appreciated.     Conjunctiva/sclera: Conjunctivae normal.     Pupils: Pupils are equal, round, and reactive to light.  Neck:     Thyroid: No thyroid mass or thyromegaly.     Vascular: No carotid bruit.     Trachea: Trachea normal.  Cardiovascular:     Rate and Rhythm: Normal rate and regular rhythm.     Pulses: Normal pulses.     Heart sounds: Normal heart sounds, S1 normal and S2 normal. No murmur heard.    No friction rub. No gallop.  Pulmonary:     Effort: Pulmonary effort is normal. No tachypnea or respiratory distress.     Breath sounds: Normal breath sounds. No decreased breath sounds, wheezing, rhonchi or rales.  Abdominal:     General: Bowel sounds are normal.     Palpations: Abdomen is soft.     Tenderness: There is no abdominal tenderness.  Musculoskeletal:     Cervical back: Normal range of motion and neck supple.  Skin:    General: Skin is warm and dry.     Findings: No rash.  Neurological:     Mental Status: She is alert.  Psychiatric:        Mood and Affect: Mood is not anxious or depressed.        Speech: Speech normal.        Behavior: Behavior normal. Behavior is cooperative.        Thought Content: Thought content normal.        Judgment: Judgment normal.       Results for orders placed or performed in visit on 99/24/26  Basic  metabolic panel  Result Value Ref Range   Glucose 121 (H) 70 - 99 mg/dL   BUN 32 (H) 8 - 27 mg/dL   Creatinine, Ser 2.25 (H) 0.57 - 1.00 mg/dL   eGFR  22 (L) >59 mL/min/1.73   BUN/Creatinine Ratio 14 12 - 28   Sodium 145 (H) 134 - 144 mmol/L   Potassium 4.4 3.5 - 5.2 mmol/L   Chloride 108 (H) 96 - 106 mmol/L   CO2 19 (L) 20 - 29 mmol/L   Calcium 10.2 8.7 - 10.3 mg/dL  CBC  Result Value Ref Range   WBC 5.7 3.4 - 10.8 x10E3/uL   RBC 3.55 (L) 3.77 - 5.28 x10E6/uL   Hemoglobin 10.9 (L) 11.1 - 15.9 g/dL   Hematocrit 33.4 (L) 34.0 - 46.6 %   MCV 94 79 - 97 fL   MCH 30.7 26.6 - 33.0 pg   MCHC 32.6 31.5 - 35.7 g/dL   RDW 13.4 11.7 - 15.4 %   Platelets 177 150 - 450 x10E3/uL     COVID 19 screen:  No recent travel or known exposure to COVID19 The patient denies respiratory symptoms of COVID 19 at this time. The importance of social distancing was discussed today.   Assessment and Plan Problem List Items Addressed This Visit     Acute kidney injury superimposed on chronic kidney disease ([place])    Recent worsening secondary to overdiuresis.  Follow closely.      Acute nonintractable headache    Now followed by neurology, felt likely to be postconcussive syndrome.  [name] brain pending. Information on supplements including magnesium, B12, coq.10 and melatonin given.      Anemia of chronic disease - Primary   Constipation    Acute, worsening  Start Miralax 17 g daily.. use daily until BM, then use as needed for constipation.      Essential hypertension   Nausea    If nauseous.. can take phenergan but try 1/2 tablet as it will make you tired.  Try smaller meals, frequently, use Ensure  as a supplement not a meal. Keep appt with kidney  doctor, Speech therapy etc.  If appetite is not improving.. we can consider Remeron at bedtime to stimulate appetite.      Persistent atrial fibrillation (HCC)    Chronic, followed by cardiology.  Continue anticoagulation with Eliquis 2.5 mg  twice daily Recent presyncope likely due to overdiuresis with Lasix.  Take potassium only with Lasix. Today in office appears euvolemic.          Eliezer Lofts, MD

## 2022-04-06 NOTE — Assessment & Plan Note (Signed)
No current fluid overload.

## 2022-04-06 NOTE — Assessment & Plan Note (Addendum)
Acute   Viral illness versus medication SE versus other... eval with labs.  Can use phenergan prn.

## 2022-04-06 NOTE — Assessment & Plan Note (Signed)
Will follow kidney function closely on diuretic.

## 2022-04-06 NOTE — Assessment & Plan Note (Signed)
Chronic Improved control with increase in  hydralazine q 8 hours  and  continuing lasix 40 mg daily per cardiology.

## 2022-04-11 ENCOUNTER — Ambulatory Visit (INDEPENDENT_AMBULATORY_CARE_PROVIDER_SITE_OTHER): Payer: Medicare Other

## 2022-04-11 VITALS — Ht 63.0 in | Wt 140.0 lb

## 2022-04-11 DIAGNOSIS — Z0000 Encounter for general adult medical examination without abnormal findings: Secondary | ICD-10-CM

## 2022-04-11 NOTE — Progress Notes (Signed)
I connected with [name] today by telephone and verified that I am speaking with the correct person using two identifiers. Location patient: home Location provider: work Persons participating in the virtual visit: Kealie Barrie, Glenna Durand LPN.   I discussed the limitations, risks, security and privacy concerns of performing an evaluation and management service by telephone and the availability of in person appointments. I also discussed with the patient that there may be a patient responsible charge related to this service. The patient expressed understanding and verbally consented to this telephonic visit.    Interactive audio and video telecommunications were attempted between this provider and patient, however failed, due to patient having technical difficulties OR patient did not have access to video capability.  We continued and completed visit with audio only.     Vital signs may be patient reported or missing.  Subjective:   [name] is a 81 y.o. female who presents for Medicare Annual (Subsequent) preventive examination.  Review of Systems     Cardiac Risk Factors include: advanced age (>22mn, >>19women);dyslipidemia;hypertension     Objective:    Today's Vitals   04/11/22 1126  Weight: 140 lb (63.5 kg)  Height: '5\' 3"'$  (1.6 m)   Body mass index is 24.8 kg/m.     04/11/2022   11:32 AM 03/22/2022    4:52 PM 11/18/2021    2:50 PM 11/17/2021    9:29 AM 10/18/2021   10:28 AM 08/17/2021    2:10 PM 08/04/2021    1:06 PM  Advanced Directives  Does Patient Have a Medical Advance Directive? Yes No Yes No Yes Yes No  Type of AParamedicof AAspinwallLiving will  HKittredgeLiving will  HEvaroLiving will HShermanLiving will HPryorLiving will  Does patient want to make changes to medical advance directive?       Yes (ED - Information included in AVS)  Copy  of HPittsburgin Chart? No - copy requested        Would patient like information on creating a medical advance directive?  No - Patient declined  No - Patient declined   Yes (ED - Information included in AVS)    Current Medications (verified) Outpatient Encounter Medications as of 04/11/2022  Medication Sig   acetaminophen (TYLENOL) 325 MG tablet Take 325 mg by mouth every 6 (six) hours as needed for mild pain.   amiodarone (PACERONE) 200 MG tablet Take 100 mg by mouth daily.   apixaban (ELIQUIS) 2.5 MG TABS tablet Take 1 tablet (2.5 mg total) by mouth 2 (two) times daily.   D 1000 25 MCG (1000 UT) capsule TAKE 1 CAPSULE BY MOUTH AT BEDTIME.   hydrALAZINE (APRESOLINE) 50 MG tablet Take 1 tablet (50 mg total) by mouth every 8 (eight) hours.   pantoprazole (PROTONIX) 40 MG tablet Take 1 tablet (40 mg total) by mouth daily.   promethazine (PHENERGAN) 25 MG tablet Take 1 tablet (25 mg total) by mouth every 8 (eight) hours as needed for nausea or vomiting.   rosuvastatin (CRESTOR) 10 MG tablet TAKE 1 TABLET BY MOUTH ONCE DAILY   No facility-administered encounter medications on file as of 04/11/2022.    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 cath 2/09: no CAD, EF 70%   GERD (gastroesophageal reflux disease) 07/26/2021   HYPERLIPIDEMIA    HYPERTENSION    Mild Aortic insufficiency    Moderate mitral regurgitation    Moderate tricuspid regurgitation    OSTEOPENIA    PAF (paroxysmal atrial fibrillation) ([place])    a. s/p ablation 2013; b. CHADS2VASc -> 4 (HTN, age x 2, female)-->Eliquis.   PAT (paroxysmal atrial tachycardia) ([place])    a. 04/2019 Zio: Occas PACs and rare PVCs. 21 atrial runs - longest 20 beats, max rate 169.   Pneumonia 2009   RA (rheumatoid  arthritis) (HCC)    Sinus Bradycardia    a. asymptomatic but prevents use of AVN blocking agents; b. 04/2019 Zio: Avg HR 61 (37-109).   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 05/2019 Echo: EF 60-65%. DD. RVSP 41.61mHg. Mod dil LA. Mod MR/TR. Mild AI.   Past Surgical History:  Procedure Laterality Date   ABLATION OF DYSRHYTHMIC FOCUS     CARDIAC CATHETERIZATION     COLONOSCOPY  04/18/08   Partial hysterectomy--1979     Thoracentesis   12/20/07     Family History  Problem Relation Age of Onset   Diabetes Other    Breast cancer Paternal Aunt 622  Heart disease Mother    Pancreatic cancer Father    Atrial fibrillation Brother    Heart disease Brother    Heart attack Brother    Social History   Socioeconomic History   Marital status: Married    Spouse name: Not on file   Number of children: Not on file   Years of education: Not on file   Highest education level: Not on file  Occupational History   Not on file  Tobacco Use   Smoking status: Never   Smokeless tobacco: Never  Vaping Use   Vaping Use: Never used  Substance and Sexual Activity   Alcohol use: No    Alcohol/week: 0.0 standard drinks of alcohol   Drug use: No   Sexual activity: Not Currently    Birth control/protection: None  Other Topics Concern   Not on file  Social History Narrative   Marital Status: widow x 1 yrChildren: 5, grandchildren 130 numerous great grand 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 Full Code ( reviewed 2015) Occasional exercise.Diet: fruits and veggies, lean meats.   Right handed    Caffeine- none    Social Determinants of Health   Financial Resource Strain: Low Risk  (04/11/2022)   Overall Financial  Resource Strain (CARDIA)    Difficulty of Paying Living Expenses: Not hard at all  Food Insecurity: No Food Insecurity (04/11/2022)   Hunger Vital Sign    Worried About Running Out of Food in the Last Year: Never true    Ran Out of Food in the Last Year: Never true  Transportation Needs: No Transportation Needs (04/11/2022)   PRAPARE - THydrologist(Medical): No    Lack of Transportation (Non-Medical): No  Physical Activity: Inactive (04/11/2022)   Exercise Vital Sign    Days of Exercise per Week: 0 days    Minutes of Exercise per Session: 0 min  Stress: No Stress Concern Present (04/11/2022)   [place]  Feeling of Stress : Not at all  Social Connections: Not on file    Tobacco Counseling Counseling given: Not Answered   Clinical Intake:  Pre-visit preparation completed: Yes  Pain : No/denies pain     Nutritional Status: BMI of 19-24  Normal Nutritional Risks: Nausea/ vomitting/ diarrhea (nausea today) Diabetes: No  How often do you need to have someone help you when you read instructions, pamphlets, or other written materials from your doctor or pharmacy?: 1 - Never What is the last grade level you completed in school?: 12th grade  Diabetic? no  Interpreter Needed?: No  Information entered by :: NAllen LPN   Activities of Daily Living    04/11/2022   11:33 AM 03/23/2022    1:00 PM  In your present state of health, do you have any difficulty performing the following activities:  Hearing? 0 0  Vision? 1 1  Difficulty concentrating or making decisions? 1 0  Walking or climbing stairs? 0 1  Dressing or bathing? 0 1  Doing errands, shopping? 1 0  Preparing Food and eating ? N   Using the Toilet? N   In the past six months, have you accidently leaked urine? N   Do you have problems with loss of bowel control? N   Managing your Medications? Y   Comment family helps    Housekeeping or managing your Housekeeping? N     Patient Care Team: [name], MD as PCP - General [name], MD as PCP - Cardiology (Cardiology) Constance Haw, MD as PCP - Electrophysiology (Cardiology) Birder Robson, MD as Referring Physician (Ophthalmology) Clent Jacks, RN as Oncology Nurse Navigator  Indicate any recent Medical Services you may have received from other than [facility] providers in the past year (date may be approximate).     Assessment:   This is a routine wellness examination for [place].  Hearing/Vision screen Vision Screening - Comments:: Regular eye exams, [facility]  Dietary issues and exercise activities discussed: Current Exercise Habits: The patient does not participate in regular exercise at present   Goals Addressed             This Visit's Progress    Patient Stated       04/11/2022, wants to get back to the gym       Depression Screen    04/11/2022   11:33 AM 08/08/2019   11:45 AM 07/16/2018   12:18 PM 07/12/2017    9:26 AM 03/10/2016   11:02 AM 05/11/2015    1:51 PM 04/17/2015   11:22 AM  PHQ 2/9 Scores  PHQ - 2 Score 0 0 0 0 0 0 0  PHQ- 9 Score   0 1       Fall Risk    04/11/2022   11:33 AM 12/21/2021    9:39 AM 08/08/2019   11:45 AM 07/16/2018   12:18 PM 07/12/2017    9:26 AM  [place] in the past year?  0 0 No No  Number falls in past yr: 0      Injury with Fall? 0      Risk for fall due to : Medication side effect      Follow up Falls evaluation completed;Education provided;Falls prevention discussed        FALL RISK PREVENTION PERTAINING TO THE HOME:  Any stairs in or around the home? Yes  If so, are there any without handrails? No  Home free of loose  throw rugs in walkways, pet beds, electrical cords, etc? Yes  Adequate lighting in your home to reduce risk of falls? Yes   ASSISTIVE DEVICES UTILIZED TO PREVENT FALLS:  Life alert? No  Use of a cane, walker or w/c? No  Grab  bars in the bathroom? Yes  Shower chair or bench in shower? Yes  Elevated toilet seat or a handicapped toilet? Yes   TIMED UP AND GO:  Was the test performed? No .      Cognitive Function:    07/16/2018   12:19 PM 07/12/2017    9:38 AM 03/10/2016   11:11 AM  MMSE - Mini Mental State Exam  Orientation to time '5 5 5  '$ Orientation to Place '5 5 5  '$ Registration '3 3 3  '$ Attention/ Calculation 0 0 0  Recall '3 1 3  '$ Recall-comments  unable to recall 2 of 3 words   Language- name 2 objects 0 0 0  Language- repeat '1 1 1  '$ Language- follow 3 step command 3 3 0  Language- follow 3 step command-comments   pt was unable to follow 3 steps to 3 step command  Language- read & follow direction 0 0 0  Write a sentence 0 0 0  Copy design 0 0 0  Total score '20 18 17        '$ Immunizations Immunization History  Administered Date(s) Administered   Fluad Quad(high Dose 65+) 07/17/2019, 11/10/2020, 10/13/2021   Influenza Whole 08/05/2009   Influenza, High Dose Seasonal PF 07/14/2015   Influenza,inj,Quad PF,6+ Mos 08/25/2016, 07/14/2017, 07/16/2018   PFIZER(Purple Top)SARS-COV-2 Vaccination 11/04/2019, 11/25/2019, 09/25/2020   PPD Test 05/01/2012, 04/17/2013   Pneumococcal Conjugate-13 04/17/2015   Pneumococcal Polysaccharide-23 10/11/2007   Td 10/11/2003, 02/13/2019    TDAP status: Up to date  Flu Vaccine status: Up to date  Pneumococcal vaccine status: Up to date  Covid-19 vaccine status: Completed vaccines  Qualifies for Shingles Vaccine? Yes   Zostavax completed No   Shingrix Completed?: No.    Education has been provided regarding the importance of this vaccine. Patient has been advised to call insurance company to determine out of pocket expense if they have not yet received this vaccine. Advised may also receive vaccine at local pharmacy or Health Dept. Verbalized acceptance and understanding.  Screening Tests Health Maintenance  Topic Date Due   Zoster Vaccines- Shingrix (1 of 2)  Never done   MAMMOGRAM  01/20/2019   COVID-19 Vaccine (4 - Booster for Pfizer series) 11/20/2020   INFLUENZA VACCINE  05/10/2022   TETANUS/TDAP  02/12/2029   Pneumonia Vaccine 1+ Years old  Completed   DEXA SCAN  Completed   Hepatitis C Screening  Completed   HPV VACCINES  Aged Out    Health Maintenance  Health Maintenance Due  Topic Date Due   Zoster Vaccines- Shingrix (1 of 2) Never done   MAMMOGRAM  01/20/2019   COVID-19 Vaccine (4 - Booster for Pfizer series) 11/20/2020    Colorectal cancer screening: No longer required.   Mammogram status: No longer required due to age.  Bone Density status: Completed 04/28/2015.  Lung Cancer Screening: (Low Dose CT Chest recommended if Age 62-80 years, 30 pack-year currently smoking OR have quit w/in 15years.) does not qualify.   Lung Cancer Screening Referral: no  Additional Screening:  Hepatitis C Screening: does qualify; Completed 12/28/2012  Vision Screening: Recommended annual ophthalmology exams for early detection of glaucoma and other disorders of the eye. Is the patient up to date with  their annual eye exam?  Yes  Who is the provider or what is the name of the office in which the patient attends annual eye exams? [facility] If pt is not established with a provider, would they like to be referred to a provider to establish care? No .   Dental Screening: Recommended annual dental exams for proper oral hygiene  Community Resource Referral / Chronic Care Management: CRR required this visit?  No   CCM required this visit?  No      Plan:     I have personally reviewed and noted the following in the patient's chart:   Medical and social history Use of alcohol, tobacco or illicit drugs  Current medications and supplements including opioid prescriptions.  Functional ability and status Nutritional status Physical activity Advanced directives List of other physicians Hospitalizations, surgeries, and ER visits  in previous 12 months Vitals Screenings to include cognitive, depression, and falls Referrals and appointments  In addition, I have reviewed and discussed with patient certain preventive protocols, quality metrics, and best practice recommendations. A written personalized care plan for preventive services as well as general preventive health recommendations were provided to patient.     Kellie Simmering, LPN   0/10/930   Nurse Notes: 6 CIT not administered. Patient has a diagnosis of memory loss. Followed by neurology.  Due to this being a virtual visit, the after visit summary with patients personalized plan was offered to patient via mail or my-chart. Patient would like to access on my-chart

## 2022-04-11 NOTE — Patient Instructions (Signed)
[name] , Thank you for taking time to come for your Medicare Wellness Visit. I appreciate your ongoing commitment to your health goals. Please review the following plan we discussed and let me know if I can assist you in the future.   Screening recommendations/referrals: Colonoscopy: not required Mammogram: not required Bone Density: completed 04/28/2015 Recommended yearly ophthalmology/optometry visit for glaucoma screening and checkup Recommended yearly dental visit for hygiene and checkup  Vaccinations: Influenza vaccine: due 05/10/2022 Pneumococcal vaccine: completed 04/17/2015 Tdap vaccine: completed 02/13/2019, due 02/12/2029 Shingles vaccine: discussed   Covid-19: 09/25/2020, 11/25/2019, 11/04/2019  Advanced directives: Please bring a copy of your POA (Power of Attorney) and/or Living Will to your next appointment.   Conditions/risks identified: none  Next appointment: Follow up in one year for your annual wellness visit    Preventive Care 65 Years and Older, Female Preventive care refers to lifestyle choices and visits with your health care provider that can promote health and wellness. What does preventive care include? A yearly physical exam. This is also called an annual well check. Dental exams once or twice a year. Routine eye exams. Ask your health care provider how often you should have your eyes checked. Personal lifestyle choices, including: Daily care of your teeth and gums. Regular physical activity. Eating a healthy diet. Avoiding tobacco and drug use. Limiting alcohol use. Practicing safe sex. Taking low-dose aspirin every day. Taking vitamin and mineral supplements as recommended by your health care provider. What happens during an annual well check? The services and screenings done by your health care provider during your annual well check will depend on your age, overall health, lifestyle risk factors, and family history of disease. Counseling  Your health  care provider may ask you questions about your: Alcohol use. Tobacco use. Drug use. Emotional well-being. Home and relationship well-being. Sexual activity. Eating habits. History of falls. Memory and ability to understand (cognition). Work and work Statistician. Reproductive health. Screening  You may have the following tests or measurements: Height, weight, and BMI. Blood pressure. Lipid and cholesterol levels. These may be checked every 5 years, or more frequently if you are over 79 years old. Skin check. Lung cancer screening. You may have this screening every year starting at age 23 if you have a 30-pack-year history of smoking and currently smoke or have quit within the past 15 years. Fecal occult blood test (FOBT) of the stool. You may have this test every year starting at age 51. Flexible sigmoidoscopy or colonoscopy. You may have a sigmoidoscopy every 5 years or a colonoscopy every 10 years starting at age 6. Hepatitis C blood test. Hepatitis B blood test. Sexually transmitted disease (STD) testing. Diabetes screening. This is done by checking your blood sugar (glucose) after you have not eaten for a while (fasting). You may have this done every 1-3 years. Bone density scan. This is done to screen for osteoporosis. You may have this done starting at age 65. Mammogram. This may be done every 1-2 years. Talk to your health care provider about how often you should have regular mammograms. Talk with your health care provider about your test results, treatment options, and if necessary, the need for more tests. Vaccines  Your health care provider may recommend certain vaccines, such as: Influenza vaccine. This is recommended every year. Tetanus, diphtheria, and acellular pertussis (Tdap, Td) vaccine. You may need a Td booster every 10 years. Zoster vaccine. You may need this after age 69. Pneumococcal 13-valent conjugate (PCV13) vaccine. One dose  is recommended after age  65. Pneumococcal polysaccharide (PPSV23) vaccine. One dose is recommended after age 7. Talk to your health care provider about which screenings and vaccines you need and how often you need them. This information is not intended to replace advice given to you by your health care provider. Make sure you discuss any questions you have with your health care provider. Document Released: 10/23/2015 Document Revised: 06/15/2016 Document Reviewed: 07/28/2015 Elsevier Interactive Patient Education  2017 [place] Prevention in the Home Falls can cause injuries. They can happen to people of all ages. There are many things you can do to make your home safe and to help prevent falls. What can I do on the outside of my home? Regularly fix the edges of walkways and driveways and fix any cracks. Remove anything that might make you trip as you walk through a door, such as a raised step or threshold. Trim any bushes or trees on the path to your home. Use bright outdoor lighting. Clear any walking paths of anything that might make someone trip, such as rocks or tools. Regularly check to see if handrails are loose or broken. Make sure that both sides of any steps have handrails. Any raised decks and porches should have guardrails on the edges. Have any leaves, snow, or ice cleared regularly. Use sand or salt on walking paths during winter. Clean up any spills in your garage right away. This includes oil or grease spills. What can I do in the bathroom? Use night lights. Install grab bars by the toilet and in the tub and shower. Do not use towel bars as grab bars. Use non-skid mats or decals in the tub or shower. If you need to sit down in the shower, use a plastic, non-slip stool. Keep the floor dry. Clean up any water that spills on the floor as soon as it happens. Remove soap buildup in the tub or shower regularly. Attach bath mats securely with double-sided non-slip rug tape. Do not have throw  rugs and other things on the floor that can make you trip. What can I do in the bedroom? Use night lights. Make sure that you have a light by your bed that is easy to reach. Do not use any sheets or blankets that are too big for your bed. They should not hang down onto the floor. Have a firm chair that has side arms. You can use this for support while you get dressed. Do not have throw rugs and other things on the floor that can make you trip. What can I do in the kitchen? Clean up any spills right away. Avoid walking on wet floors. Keep items that you use a lot in easy-to-reach places. If you need to reach something above you, use a strong step stool that has a grab bar. Keep electrical cords out of the way. Do not use floor polish or wax that makes floors slippery. If you must use wax, use non-skid floor wax. Do not have throw rugs and other things on the floor that can make you trip. What can I do with my stairs? Do not leave any items on the stairs. Make sure that there are handrails on both sides of the stairs and use them. Fix handrails that are broken or loose. Make sure that handrails are as long as the stairways. Check any carpeting to make sure that it is firmly attached to the stairs. Fix any carpet that is loose or worn. Avoid  having throw rugs at the top or bottom of the stairs. If you do have throw rugs, attach them to the floor with carpet tape. Make sure that you have a light switch at the top of the stairs and the bottom of the stairs. If you do not have them, ask someone to add them for you. What else can I do to help prevent falls? Wear shoes that: Do not have high heels. Have rubber bottoms. Are comfortable and fit you well. Are closed at the toe. Do not wear sandals. If you use a stepladder: Make sure that it is fully opened. Do not climb a closed stepladder. Make sure that both sides of the stepladder are locked into place. Ask someone to hold it for you, if  possible. Clearly mark and make sure that you can see: Any grab bars or handrails. First and last steps. Where the edge of each step is. Use tools that help you move around (mobility aids) if they are needed. These include: Canes. Walkers. Scooters. Crutches. Turn on the lights when you go into a dark area. Replace any light bulbs as soon as they burn out. Set up your furniture so you have a clear path. Avoid moving your furniture around. If any of your floors are uneven, fix them. If there are any pets around you, be aware of where they are. Review your medicines with your doctor. Some medicines can make you feel dizzy. This can increase your chance of falling. Ask your doctor what other things that you can do to help prevent falls. This information is not intended to replace advice given to you by your health care provider. Make sure you discuss any questions you have with your health care provider. Document Released: 07/23/2009 Document Revised: 03/03/2016 Document Reviewed: 10/31/2014 Elsevier Interactive Patient Education  2017 [employer].

## 2022-04-18 ENCOUNTER — Inpatient Hospital Stay: Payer: Medicare Other | Admitting: Oncology

## 2022-04-18 ENCOUNTER — Encounter: Payer: Self-pay | Admitting: Oncology

## 2022-04-18 ENCOUNTER — Inpatient Hospital Stay: Payer: Medicare Other | Attending: Oncology

## 2022-04-18 VITALS — BP 182/69 | HR 52 | Resp 16 | Ht 63.0 in | Wt 141.9 lb

## 2022-04-18 DIAGNOSIS — D631 Anemia in chronic kidney disease: Secondary | ICD-10-CM

## 2022-04-18 DIAGNOSIS — N1832 Chronic kidney disease, stage 3b: Secondary | ICD-10-CM | POA: Insufficient documentation

## 2022-04-18 LAB — CBC
HCT: 29.1 % — ABNORMAL LOW (ref 36.0–46.0)
Hemoglobin: 9.7 g/dL — ABNORMAL LOW (ref 12.0–15.0)
MCH: 31.5 pg (ref 26.0–34.0)
MCHC: 33.3 g/dL (ref 30.0–36.0)
MCV: 94.5 fL (ref 80.0–100.0)
Platelets: 149 10*3/uL — ABNORMAL LOW (ref 150–400)
RBC: 3.08 MIL/uL — ABNORMAL LOW (ref 3.87–5.11)
RDW: 13.8 % (ref 11.5–15.5)
WBC: 5 10*3/uL (ref 4.0–10.5)
nRBC: 0 % (ref 0.0–0.2)

## 2022-04-18 LAB — IRON AND TIBC
Iron: 62 ug/dL (ref 28–170)
Saturation Ratios: 19 % (ref 10.4–31.8)
TIBC: 319 ug/dL (ref 250–450)
UIBC: 257 ug/dL

## 2022-04-18 LAB — FERRITIN: Ferritin: 80 ng/mL (ref 11–307)

## 2022-04-18 NOTE — Progress Notes (Signed)
Hematology/Oncology Consult note [facility]  [phone] [phone]  Patient Care Team: [name], MD as PCP - General [name], MD as PCP - Cardiology (Cardiology) Constance Haw, MD as PCP - Electrophysiology (Cardiology) Birder Robson, MD as Referring Physician (Ophthalmology) Clent Jacks, RN as Oncology Nurse Navigator   Name of the patient: [name]  [id number]  June 13, 1941   Date of visit: 04/18/22  Diagnosis-anemia of chronic kidney disease  Chief complaint/ Reason for visit-routine follow-up of anemia of chronic kidney disease  Heme/Onc history: patient is a 81 year old female with a past medical history significant for atrial fibrillation, paroxysmal supraventricular tachycardia, hypertension, coronary artery disease referred for anemia.  Her most recent CBC with differential from 11/11/2020 showed a white count of 5.4, H&H of 10.6/32.1 with an MCV of 92 and a platelet count of 183.  Looking back at her prior CBCs patient has always had a normal white count and a platelet count and her hemoglobin has typically remained between 10-11 since 2009.  Patient recently had iron studies, TSH and B12 checked on 11/11/2020 which was within normal limits.     Patient does have chronic kidney disease but has not required any EPO so far  Interval history-patient is concerned about her poor appetite and ongoing weight loss.She is trying to eat but is unable to keep her weight stable.  ECOG PS- 1 Pain scale- 0  Review of systems- Review of Systems  Constitutional:  Positive for malaise/fatigue and weight loss.      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 cath 2/09: no CAD, EF 70%   GERD (gastroesophageal reflux disease) 07/26/2021   HYPERLIPIDEMIA    HYPERTENSION    Mild Aortic insufficiency    Moderate mitral regurgitation    Moderate tricuspid regurgitation    OSTEOPENIA    PAF (paroxysmal atrial fibrillation) ([place])    a. s/p ablation 2013; b. CHADS2VASc -> 4 (HTN, age x 2, female)-->Eliquis.   PAT (paroxysmal atrial tachycardia) ([place])    a. 04/2019 Zio: Occas PACs and rare PVCs. 21 atrial runs - longest 20 beats, max rate 169.   Pneumonia 2009   RA (rheumatoid arthritis) ([facility])    Sinus Bradycardia    a. asymptomatic but prevents use of AVN blocking agents; b. 04/2019 Zio: Avg HR 61 (37-109).   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 05/2019 Echo: EF 60-65%. DD. RVSP 41.47mHg. Mod dil LA. Mod MR/TR. Mild AI.     Past Surgical History:  Procedure Laterality Date   ABLATION OF DYSRHYTHMIC FOCUS     CARDIAC CATHETERIZATION     COLONOSCOPY  04/18/08   Partial hysterectomy--1979     Thoracentesis   12/20/07      Social History   Socioeconomic History   Marital status: Married    Spouse name: Not on file   Number of children: Not on file   Years of education: Not on file   Highest education level: Not on file  Occupational History   Not on  file  Tobacco Use   Smoking status: Never   Smokeless tobacco: Never  Vaping Use   Vaping Use: Never used  Substance and Sexual Activity   Alcohol use: No    Alcohol/week: 0.0 standard drinks of alcohol   Drug use: No   Sexual activity: Not Currently    Birth control/protection: None  Other Topics Concern   Not on file  Social History Narrative   Marital Status: widow x 1 yrChildren: 5, grandchildren 11, numerous great grand 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 Full Code ( reviewed 2015) Occasional exercise.Diet: fruits and veggies, lean meats.   Right handed    Caffeine- none    Social Determinants of Health   Financial Resource Strain: Low Risk  (04/11/2022)   Overall Financial Resource Strain (CARDIA)    Difficulty of Paying Living Expenses: Not hard at all  Food Insecurity: No Food Insecurity (04/11/2022)   Hunger Vital Sign    Worried About Running Out of Food in the Last Year: Never true    Ran Out of Food in the Last Year: Never true  Transportation Needs: No Transportation Needs (04/11/2022)   PRAPARE - Hydrologist (Medical): No    Lack of Transportation (Non-Medical): No  Physical Activity: Inactive (04/11/2022)   Exercise Vital Sign    Days of Exercise per Week: 0 days    Minutes of Exercise per Session: 0 min  Stress: No Stress Concern Present (04/11/2022)   [place]    Feeling of Stress : Not at all  Social Connections: Not on file  Intimate Partner Violence: Not on file    Family History  Problem Relation Age of Onset   Diabetes Other    Breast cancer Paternal Aunt 65   Heart disease Mother    Pancreatic cancer Father    Atrial fibrillation Brother    Heart disease Brother    Heart attack Brother      Current Outpatient Medications:    acetaminophen (TYLENOL) 325 MG tablet, Take 325 mg by mouth every 6 (six) hours as needed for mild pain., Disp: , Rfl:    apixaban (ELIQUIS) 2.5 MG TABS tablet, Take 1 tablet (2.5 mg total) by mouth 2 (two) times daily., Disp: 180 tablet, Rfl: 3   D 1000 25 MCG (1000 UT) capsule, TAKE 1 CAPSULE BY MOUTH AT BEDTIME., Disp: 30 capsule, Rfl: 3   hydrALAZINE (APRESOLINE) 50 MG tablet, Take 1 tablet  (50 mg total) by mouth every 8 (eight) hours., Disp: 270 tablet, Rfl: 3   LUMIGAN 0.01 % SOLN, SMARTSIG:In Eye(s), Disp: , Rfl:    pantoprazole (PROTONIX) 40 MG tablet, Take 1 tablet (40 mg total) by mouth daily., Disp: 30 tablet, Rfl: 11   POTASSIUM PO, Take 20 mEq by mouth., Disp: , Rfl:    promethazine (PHENERGAN) 25 MG tablet, Take 1 tablet (25 mg total) by mouth every 8 (eight) hours as needed for nausea or vomiting., Disp: 20 tablet, Rfl: 0   rosuvastatin (CRESTOR) 10 MG tablet, TAKE 1 TABLET BY MOUTH ONCE DAILY, Disp: 90 tablet, Rfl: 2   amiodarone (PACERONE) 200 MG tablet, Take 100 mg by mouth daily. (Patient not taking: Reported on 04/18/2022), Disp: , Rfl:   Physical exam:  Vitals:   04/18/22 1116  BP: Marland Kitchen)  182/69  Pulse: (!) 52  Resp: 16  SpO2: 98%  Weight: 141 lb 14.4 oz (64.4 kg)  Height: '5\' 3"'$  (1.6 m)   Physical Exam Cardiovascular:     Rate and Rhythm: Normal rate and regular rhythm.     Heart sounds: Normal heart sounds.  Pulmonary:     Effort: Pulmonary effort is normal.     Breath sounds: Normal breath sounds.  Abdominal:     General: Bowel sounds are normal. There is no distension.     Palpations: Abdomen is soft. There is no mass.     Tenderness: There is no abdominal tenderness.  Lymphadenopathy:     Comments: No palpable cervical, supraclavicular, axillary or inguinal adenopathy    Skin:    General: Skin is warm and dry.  Neurological:     Mental Status: She is alert and oriented to person, place, and time.         Latest Ref Rng & Units 03/29/2022   11:53 AM  CMP  Glucose 70 - 99 mg/dL 121   BUN 8 - 27 mg/dL 32   Creatinine 0.57 - 1.00 mg/dL 2.25   Sodium 134 - 144 mmol/L 145   Potassium 3.5 - 5.2 mmol/L 4.4   Chloride 96 - 106 mmol/L 108   CO2 20 - 29 mmol/L 19   Calcium 8.7 - 10.3 mg/dL 10.2       Latest Ref Rng & Units 04/18/2022   11:08 AM  CBC  WBC 4.0 - 10.5 K/uL 5.0   Hemoglobin 12.0 - 15.0 g/dL 9.7   Hematocrit 36.0 - 46.0 % 29.1    Platelets 150 - 400 K/uL 149     No images are attached to the encounter.  MR ANGIO NECK WO CONTRAST  Result Date: 03/23/2022 CLINICAL DATA:  Initial evaluation for acute syncope/presyncope. EXAM: MRA NECK WITHOUT CONTRAST TECHNIQUE: Angiographic images of the neck were acquired using MRA technique without intravenous contrast. Carotid stenosis measurements (when applicable) are obtained utilizing NASCET criteria, using the distal internal carotid diameter as the denominator. COMPARISON:  None Available. FINDINGS: Aortic arch: Visualized aortic arch normal caliber with standard branching pattern. No stenosis about the origin of the great vessels. Right carotid system: Right common and internal carotid arteries patent without stenosis or evidence for dissection. Left carotid system: Left common and internal carotid arteries patent without stenosis or evidence for dissection. Vertebral arteries: Both vertebral arteries arise from the subclavian arteries. No visible proximal subclavian artery stenosis. Vertebral arteries co dominant and patent without stenosis or dissection. Other: None. IMPRESSION: Normal MRA of the neck. Electronically Signed   By: Jeannine Boga M.D.   On: 03/23/2022 01:46   MR BRAIN WO CONTRAST  Result Date: 03/23/2022 CLINICAL DATA:  Initial evaluation for acute syncope/presyncope. EXAM: [name] HEAD WITHOUT CONTRAST TECHNIQUE: Multiplanar, multiecho pulse sequences of the brain and surrounding structures were obtained without intravenous contrast. COMPARISON:  CT from 03/22/2022. FINDINGS: Brain: Cerebral volume within normal limits for age. Scattered patchy T2/FLAIR hyperintensity involving the periventricular and deep white matter both cerebral hemispheres, most consistent with chronic small vessel ischemic disease, moderate in nature. No evidence for acute or subacute infarct. Gray-white matter differentiation maintained. No encephalomalacia to suggest chronic cortical  infarction. No acute or chronic intracranial blood products. No mass lesion, midline shift or mass effect. No hydrocephalus or extra-axial fluid collection. Pituitary gland and suprasellar region within normal limits. Vascular: Major intracranial vascular flow voids are maintained. Skull and upper cervical spine: Craniocervical junction  within normal limits. Bone marrow signal intensity normal. No scalp soft tissue abnormality. Sinuses/Orbits: Patient status post ocular lens replacement on the right. Paranasal sinuses are clear. No significant mastoid effusion. Other: None. IMPRESSION: 1. No acute intracranial abnormality. 2. Patchy T2/FLAIR signal abnormality involving the supratentorial cerebral white matter, most consistent with chronic small vessel ischemic disease. Appearance is moderately advanced in nature. Electronically Signed   By: Jeannine Boga M.D.   On: 03/23/2022 01:44   CT HEAD WO CONTRAST (5MM)  Result Date: 03/22/2022 CLINICAL DATA:  Head trauma, minor (Age >= 65y). EXAM: CT HEAD WITHOUT CONTRAST TECHNIQUE: Contiguous axial images were obtained from the base of the skull through the vertex without intravenous contrast. RADIATION DOSE REDUCTION: This exam was performed according to the departmental dose-optimization program which includes automated exposure control, adjustment of the mA and/or kV according to patient size and/or use of iterative reconstruction technique. COMPARISON:  [id number] FINDINGS: Brain: Mild age related volume loss. No acute intracranial abnormality. Specifically, no hemorrhage, hydrocephalus, mass lesion, acute infarction, or significant intracranial injury. Vascular: No hyperdense vessel or unexpected calcification. Skull: No acute calvarial abnormality. Sinuses/Orbits: No acute findings Other: None IMPRESSION: No acute intracranial abnormality. Electronically Signed   By: Rolm Baptise M.D.   On: 03/22/2022 21:11   CT ABDOMEN PELVIS WO CONTRAST  Result Date:  03/22/2022 CLINICAL DATA:  Abdominal pain, acute, nonlocalized.  Nausea. EXAM: CT ABDOMEN AND PELVIS WITHOUT CONTRAST TECHNIQUE: Multidetector CT imaging of the abdomen and pelvis was performed following the standard protocol without IV contrast. RADIATION DOSE REDUCTION: This exam was performed according to the departmental dose-optimization program which includes automated exposure control, adjustment of the mA and/or kV according to patient size and/or use of iterative reconstruction technique. COMPARISON:  None Available. FINDINGS: Lower chest: No acute abnormality Hepatobiliary: Large cyst in the left hepatic lobe measures 5.2 cm. Smaller scattered subcentimeter cysts throughout the liver. Gallbladder unremarkable. Pancreas: No focal abnormality or ductal dilatation. Spleen: No focal abnormality.  Normal size. Adrenals/Urinary Tract: 4.2 cm left lower pole renal cyst. No stones or hydronephrosis bilaterally. Adrenal glands and urinary bladder unremarkable. Stomach/Bowel: Moderate stool burden throughout the colon. Normal appendix. Stomach, large and small bowel grossly unremarkable. Vascular/Lymphatic: Aortic atherosclerosis. No evidence of aneurysm or adenopathy. Reproductive: Prior hysterectomy.  No adnexal masses. Other: No free fluid or free air. Musculoskeletal: No acute bony abnormality. IMPRESSION: No acute findings in the abdomen or pelvis. Hepatic and left renal cysts. Aortic atherosclerosis. Moderate stool burden. Electronically Signed   By: Rolm Baptise M.D.   On: 03/22/2022 21:10     Assessment and plan- Patient is a 81 y.o. female With anemia of chronic kidney disease here for routine follow-up  Patient's hemoglobin has remained stable around 10 for the last year and a half.  Her hemoglobin today is 9.7.  Just 2 weeks ago it was 10.9.  I am holding off on starting any EPO at this time.  Iron studies are presently pending.  We will consider EPO if hemoglobin is consistently in the nines.   Repeat CBC ferritin and iron studies in 6 months in 1 year and I will see her back in 1 year.  Weight loss: Patient was 146 pounds a year ago and is presently at 141 pounds.  Weight loss is therefore not significant although patient mainly complains of poor appetite.  CT abdomen did not show any evidence of acute pathology.  No palpable adenopathy on today's exam.  Patient is asking about appetite stimulants and  I have encouraged her to speak to Dr. Diona Browner about it.  Clinically I do not suspect any malignancy as a cause for her weight loss     Visit Diagnosis 1. Anemia of chronic renal failure, stage 3b (HCC)      Dr. Randa Evens, MD, MPH [facility] [place] [phone] 04/18/2022 12:49 PM

## 2022-04-21 NOTE — Telephone Encounter (Signed)
Per [facility], patient said she would call back to schedule but never did.

## 2022-05-10 ENCOUNTER — Ambulatory Visit
Admission: RE | Admit: 2022-05-10 | Discharge: 2022-05-10 | Disposition: A | Discharge status: Home / Self Care | Payer: Medicare Other | Source: Ambulatory Visit | Attending: Nurse Practitioner | Admitting: Nurse Practitioner

## 2022-05-10 DIAGNOSIS — K5909 Other constipation: Secondary | ICD-10-CM | POA: Insufficient documentation

## 2022-05-10 DIAGNOSIS — N9489 Other specified conditions associated with female genital organs and menstrual cycle: Secondary | ICD-10-CM | POA: Insufficient documentation

## 2022-05-10 DIAGNOSIS — Z780 Asymptomatic menopausal state: Secondary | ICD-10-CM | POA: Diagnosis not present

## 2022-05-10 DIAGNOSIS — K5900 Constipation, unspecified: Secondary | ICD-10-CM | POA: Insufficient documentation

## 2022-05-10 NOTE — Assessment & Plan Note (Signed)
Chronic, followed by cardiology.  Continue anticoagulation with Eliquis 2.5 mg twice daily Recent presyncope likely due to overdiuresis with Lasix.  Take potassium only with Lasix. Today in office appears euvolemic.

## 2022-05-10 NOTE — Assessment & Plan Note (Signed)
Acute, worsening  Start Miralax 17 g daily.. use daily until BM, then use as needed for constipation.

## 2022-05-10 NOTE — Assessment & Plan Note (Signed)
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  [name] brain pending. Information on supplements including magnesium, B12, coq.10 and melatonin given.

## 2022-05-10 NOTE — Assessment & Plan Note (Signed)
If nauseous.. can take phenergan but try 1/2 tablet as it will make you tired.  Try smaller meals, frequently, use Ensure  as a supplement not a meal. Keep appt with kidney  doctor, Speech therapy etc.  If appetite is not improving.. we can consider Remeron at bedtime to stimulate appetite.

## 2022-05-10 NOTE — Assessment & Plan Note (Addendum)
Recent worsening secondary to overdiuresis.  Follow closely.

## 2022-05-12 ENCOUNTER — Inpatient Hospital Stay: Payer: Medicare Other | Attending: Oncology | Admitting: Nurse Practitioner

## 2022-05-12 ENCOUNTER — Ambulatory Visit: Payer: Medicare Other | Admitting: Nurse Practitioner

## 2022-05-12 VITALS — BP 130/55 | HR 53 | Temp 96.9°F | Resp 20 | Ht 63.0 in | Wt 139.0 lb

## 2022-05-12 DIAGNOSIS — N9489 Other specified conditions associated with female genital organs and menstrual cycle: Secondary | ICD-10-CM | POA: Diagnosis not present

## 2022-05-12 DIAGNOSIS — D631 Anemia in chronic kidney disease: Secondary | ICD-10-CM | POA: Insufficient documentation

## 2022-05-12 DIAGNOSIS — N1832 Chronic kidney disease, stage 3b: Secondary | ICD-10-CM | POA: Insufficient documentation

## 2022-05-12 DIAGNOSIS — Z90710 Acquired absence of both cervix and uterus: Secondary | ICD-10-CM | POA: Diagnosis not present

## 2022-05-12 NOTE — Progress Notes (Signed)
[facility]cology Consult Visit   Referring Provider: PCP Dr Diona Browner  Chief Concern: adnexal mass  Subjective:  [name] is a 81 y.o. female who is seen in consultation from Dr. Diona Browner for follow up for incidental adnexal mass, low ROMA score. She has been followed conservatively with imaging.   05/10/22- US Pelvic Complete with Transvaginal Uterus: surgically absent Endometrium: surgically absent.  Right ovary: surgically absent Left Ovary: no normal appearing left ovary visualized Other: No free pelvic fluid. Oblong cystic structure identified in LEFT adnexa 4.5 x 2.0 x 1.5 cm, containing mobile debris. This is littlechanged in size since the 5.2 x 1.7 x 1.8 cm on 06/09/2021. Debris fluid level again noted. The echogenic nodule seen in 2022 is now seen within the debris fluid level, proximally 4 mm in size, mobile. No additional pelvic masses. Impression:  - Post hysterectomy with nonvisualization of ovaries - Stable appearance of complicated cystic lesion in the LEFT adnexa containing mobile debris and a small mobile nodule; this could represent a cyst or potentially a segment of hydrosalpinx. - Continued follo. w-up imaging recommended in 6-12 months to demonstrate continued stability  She continues to feel at baseline. Worried about appetite changes and weight loss. 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 No abdominal pain   Gynecologic History:  Presented to pcp for hip pain. Imaging was performed which showed incidental adnexal mass.   CKD with anemia. Bradycardia  04/28/21 [name]  IMPRESSION: 1. No acute osseous findings.  Minimal left hip osteoarthritis. 2. Tendinosis with low-grade insertional tearing of the bilateral gluteus minimus tendons. 3. Mild bilateral gluteus medius tendinosis without tear. 4. Mild tendinosis of the bilateral hamstring tendon origins. 5. Left adnexal cyst measuring up to 3.9 cm with possible small nodule along its superior aspect. Pelvic  ultrasound is recommended for further characterization.  Pelvic US 06/09/21 Post hysterectomy with nonvisualization of normal appearing ovaries.5.2 x 1.7 x 1.8 cm diameter complex cystic nodule in LEFT adnexa question of ovarian or paraovarian origin, complicated by scattered internal echoes and a 5 mm mural nodule which lacks internal blood flow on color Doppler imaging.   US Pelvic complete 11/04/2021 FINDINGS: Uterus: Surgically absent Endometrium: Surgically absent Right ovary: Not visualized, likely obscured by bowel Left ovary: No normal appearing LEFT ovary visualized, see below Other findings: Complex cyst LEFT adnexa 4.6 x 1.6 x 1.9 cm (previously 5.2 x 1.7 x 1.8 cm), containing a dependent debris fluid level. Single small mural nodule within the cystic lesion, 5 x 5 x 3 mm, measured 5 x 5 x 5 mm on previous exam. No septations, wall thickening, or additional mural nodules. No free pelvic fluid or additional pelvic masses. IMPRESSION: - Surgical absence of uterus with nonvisualization of RIGHT ovary. - Minimally complicated cyst of the LEFT adnexa 4.6 cm greatest size slightly decreased from the 5.2 cm on the previous exam, containing a dependent fluid level consistent with old hemorrhage or debris. - The small nodular focus within the lesion is little changed, remaining 5 mm greatest diameter. - No other pelvic sonographic abnormalities.   Problem List: Patient Active Problem List   Diagnosis Date Noted   Constipation 05/10/2022   Acute kidney injury superimposed on chronic kidney disease ([place]) 03/22/2022   Nausea 03/03/2022   Carotid artery calcification, bilateral 12/21/2021   Osteoarthritis of cervical spine 12/21/2021   Atrial fibrillation with rapid ventricular response ([place]) 08/18/2021   Acute on chronic diastolic CHF (congestive heart failure) ([place]) 08/17/2021   Adnexal mass 08/04/2021  Anemia of chronic renal failure, stage 3b ([place]) 08/03/2021   GERD (gastroesophageal  reflux disease) 07/26/2021   Memory loss 11/10/2020   Sinus bradycardia 07/18/2019   Valvular heart disease 07/18/2019   PSVT (paroxysmal supraventricular tachycardia) ([place]) 05/16/2019   Persistent atrial fibrillation ([place]) 07/19/2018   Peripheral edema 02/02/2018   Acute nonintractable headache 02/02/2018   Coronary artery disease, non-occlusive 09/14/2017   PAF (paroxysmal atrial fibrillation) ([place]) 09/14/2017   Status post ablation of atrial fibrillation 09/14/2017   Essential hypertension 09/14/2017   Tinea pedis 07/14/2017   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/15/2016   History of rheumatoid arthritis 03/14/2016   Mitral regurgitation 12/30/2015   Obesity 12/02/2015   Aortic valve insufficiency 12/02/2015   Vitamin D deficiency 04/17/2015   Counseling regarding end of life decision making 04/17/2015   Allergic rhinitis 02/03/2015   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 cath 2/09: no CAD, EF 70%   GERD (gastroesophageal reflux disease) 07/26/2021   HYPERLIPIDEMIA    HYPERTENSION    Mild Aortic insufficiency    Moderate mitral regurgitation    Moderate tricuspid regurgitation    OSTEOPENIA    PAF (paroxysmal atrial fibrillation) ([place])    a. s/p ablation 2013; b. CHADS2VASc -> 4 (HTN, age x 2, female)-->Eliquis.   PAT (paroxysmal atrial tachycardia) ([place])    a. 04/2019 Zio: Occas PACs and rare PVCs. 21 atrial runs - longest 20 beats, max rate 169.   Pneumonia 2009   RA (rheumatoid arthritis) ([facility])    Sinus Bradycardia    a. asymptomatic but prevents use of AVN blocking agents; b. 04/2019 Zio: Avg HR 61  (37-109).   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 05/2019 Echo: EF 60-65%. DD. RVSP 41.68mHg. Mod dil LA. Mod MR/TR. Mild AI.    Past Surgical History: Past Surgical History:  Procedure Laterality Date   ABLATION OF DYSRHYTHMIC FOCUS     CARDIAC CATHETERIZATION     COLONOSCOPY  04/18/08   Partial hysterectomy--1979     Thoracentesis   12/20/07    .  Family History: Family History  Problem Relation Age of Onset   Diabetes Other    Breast cancer Paternal Aunt [phone]  Heart disease Mother    Pancreatic cancer Father    Atrial fibrillation Brother    Heart disease Brother    Heart attack Brother     Social History: Social History   Socioeconomic History   Marital status: Married    Spouse name: Not on file   Number of children: Not on file   Years of education: Not on file   Highest education level: Not on file  Occupational History   Not on file  Tobacco Use   Smoking status: Never   Smokeless tobacco: Never  Vaping Use   Vaping Use: Never used  Substance and Sexual Activity   Alcohol use: No    Alcohol/week: 0.0 standard drinks of alcohol   Drug use: No   Sexual activity: Not Currently    Birth control/protection: None  Other Topics Concern   Not on file  Social History Narrative   Marital Status: widow x 1 yrChildren: 37, grandchildren 40, numerous great grand 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 Full Code ( reviewed 2015) Occasional exercise.Diet: fruits and veggies, lean meats.   Right handed    Caffeine- none    Social Determinants of Health   Financial Resource Strain: Low Risk  (04/11/2022)   Overall Financial Resource Strain (CARDIA)    Difficulty of Paying Living  Expenses: Not hard at all  Food Insecurity: No Food Insecurity (04/11/2022)   Hunger Vital Sign    Worried About Running Out of Food in the Last Year: Never true    Ran Out of Food in the Last Year: Never true  Transportation Needs: No Transportation Needs (04/11/2022)   PRAPARE - Hydrologist (Medical): No    Lack of Transportation (Non-Medical): No  Physical Activity: Inactive (04/11/2022)   Exercise Vital Sign    Days of Exercise per Week: 0 days    Minutes of Exercise per Session: 0 min  Stress: No Stress Concern Present (04/11/2022)   [place]    Feeling of Stress : Not at all  Social Connections: Not on file  Intimate Partner Violence: Not on file    Allergies: Allergies  Allergen Reactions   Amlodipine     Ankle swelling   Celecoxib Other (See Comments) and Rash    Tachycardia/palpitations Other reaction(s): Other Tachycardia/palpitations Tachycardia/palpitations   Tramadol Nausea Only    Current Medications: Current Outpatient Medications  Medication Sig Dispense Refill   acetaminophen (TYLENOL) 325 MG tablet Take 325 mg by mouth every 6 (six) hours as needed for mild pain.     apixaban (ELIQUIS) 2.5 MG TABS tablet Take 1 tablet (2.5 mg total) by mouth 2 (two) times daily. 180 tablet 3   D 1000 25 MCG (1000 UT) capsule TAKE 1 CAPSULE BY MOUTH AT BEDTIME. 30 capsule 3   furosemide (LASIX) 40 MG tablet Take 20 mg by mouth as needed for fluid or edema.     hydrALAZINE (APRESOLINE) 50 MG tablet Take 1 tablet (50 mg total) by mouth every 8 (eight) hours. 270 tablet 3   LUMIGAN 0.01 % SOLN SMARTSIG:In Eye(s)     pantoprazole (PROTONIX) 40 MG tablet Take 1 tablet (40 mg total) by mouth daily. 30 tablet 11   POTASSIUM PO Take 20 mEq by mouth.     promethazine (PHENERGAN) 25 MG tablet Take 1 tablet (25 mg total) by mouth every 8 (eight) hours as needed for nausea or vomiting. 20 tablet 0    rosuvastatin (CRESTOR) 10 MG tablet TAKE 1 TABLET BY MOUTH ONCE DAILY 90 tablet 2   No current facility-administered medications for this visit.    Review of Systems General:  fatigue Skin: no complaints Eyes: no complaints HEENT: no complaints Breasts: no complaints Pulmonary: no complaints Cardiac: no complaints Gastrointestinal: no complaints Genitourinary/Sexual: no complaints Ob/Gyn: no complaints Musculoskeletal: no complaints Hematology: no complaints Neurologic/Psych: no complaints   Objective:  Physical Examination:  BP (!) 130/55   Pulse (!) 53   Temp (!) 96.9 F (36.1 C) (Tympanic)   Resp 20   Ht '5\' 3"'$  (1.6 m)   Wt 139 lb (63 kg)   BMI 24.62 kg/m    ECOG Performance Status: 1 - Symptomatic  but completely ambulatory  GENERAL: Patient is a well appearing female in no acute distress HEENT:  Sclera clear. Anicteric NODES:  Negative axillary, supraclavicular, inguinal lymph node survery LUNGS:  Clear to auscultation bilaterally.   HEART:  Irregularly irregular ABDOMEN:  Soft, nontender. No masses or ascites EXTREMITIES:  No peripheral edema. Atraumatic. No cyanosis SKIN:  Clear with no obvious rashes or skin changes.  NEURO:  Nonfocal. Well oriented.  Appropriate affect.  Pelvic: declined  Lab Review: Labs on site today:  No labs on site today  Radiologic Imaging: Per hpi  Assessment:  CONTESSA PREUSS is a 81 y.o. female diagnosed with incidentally detected complex 5.9 cm left adnexal mass with small mural nodule. Low suspicion for cancer. Normal ca 125. Follow up ultrasound was stable to smaller. Mural nodule was stable appearing. Clinically, asymptomatic.   Prior hysterectomy.   Medical co-morbidities complicating care: CKD/anemia, bradycardia, a fib w/ hx of ablation on eliquis Plan:   Problem List Items Addressed This Visit   None  She remains asymptomatic. Ultrasound was independently reviewed and cyst appears smaller and single mural  nodule within cystic lesion is essentially stable to slightly smaller. Probability of malignancy is low. CA 125 was also low and normal. We discussed repeating ultrasound and ca 125 in 6 months to 1 year. Mass has been followed and stable for > 1 year. She prefers to be followed by pcp and will return to clinic as needed, if she becomes symptomatic.   The patient's diagnosis, an outline of the further diagnostic and laboratory studies which will be required, the recommendation, and alternatives were discussed.  All questions were answered to the patient's satisfaction.  I discussed the assessment and treatment plan with the patient. The patient was provided an opportunity to ask questions and all were answered. The patient agreed with the plan and demonstrated an understanding of the instructions.   The patient was advised to call back or seek an in-person evaluation if the symptoms worsen or if the condition fails to improve as anticipated.   I spent 20 minutes face-to-face visit time dedicated to the care of this patient on the date of this encounter to including pre-visit review of notes, face-to-face time with the patient, and post visit ordering of testing/documentation.  Verlon Au, NP  CC:  [name], MD [address] [place],  [place] [address] [phone]

## 2022-05-12 NOTE — Progress Notes (Signed)
Patient reports decrease appetite and intermittent nausea. Patient reports frequent urination which she contributes to her "fluid pills." She is drinking 2 ensures a day.  She denies any constipation or diarrhea.

## 2022-05-25 DIAGNOSIS — N184 Chronic kidney disease, stage 4 (severe): Secondary | ICD-10-CM | POA: Diagnosis not present

## 2022-05-25 DIAGNOSIS — N1832 Chronic kidney disease, stage 3b: Secondary | ICD-10-CM | POA: Diagnosis not present

## 2022-05-25 DIAGNOSIS — I4891 Unspecified atrial fibrillation: Secondary | ICD-10-CM | POA: Diagnosis not present

## 2022-05-25 DIAGNOSIS — N2581 Secondary hyperparathyroidism of renal origin: Secondary | ICD-10-CM | POA: Diagnosis not present

## 2022-05-25 DIAGNOSIS — D631 Anemia in chronic kidney disease: Secondary | ICD-10-CM | POA: Diagnosis not present

## 2022-05-25 DIAGNOSIS — I129 Hypertensive chronic kidney disease with stage 1 through stage 4 chronic kidney disease, or unspecified chronic kidney disease: Secondary | ICD-10-CM | POA: Diagnosis not present

## 2022-05-26 ENCOUNTER — Other Ambulatory Visit (HOSPITAL_BASED_OUTPATIENT_CLINIC_OR_DEPARTMENT_OTHER): Payer: Self-pay | Admitting: Cardiovascular Disease

## 2022-05-26 NOTE — Telephone Encounter (Signed)
Rx(s) sent to pharmacy electronically.  

## 2022-06-21 ENCOUNTER — Other Ambulatory Visit (HOSPITAL_BASED_OUTPATIENT_CLINIC_OR_DEPARTMENT_OTHER): Payer: Self-pay | Admitting: Cardiovascular Disease

## 2022-06-22 NOTE — Telephone Encounter (Signed)
Rx request sent to pharmacy.  

## 2022-06-27 ENCOUNTER — Ambulatory Visit: Payer: Medicare Other | Attending: Cardiology | Admitting: Cardiology

## 2022-06-27 ENCOUNTER — Encounter: Payer: Self-pay | Admitting: Cardiology

## 2022-06-27 VITALS — BP 140/72 | HR 53 | Ht 63.0 in | Wt 135.8 lb

## 2022-06-27 DIAGNOSIS — I4819 Other persistent atrial fibrillation: Secondary | ICD-10-CM

## 2022-06-27 DIAGNOSIS — D6869 Other thrombophilia: Secondary | ICD-10-CM

## 2022-06-27 NOTE — Progress Notes (Signed)
Electrophysiology Office Note   Date:  06/27/2022   ID:  SEYMONE FORLENZA, DOB 02/07/1941, MRN [id number]  PCP:  [name], MD  Cardiologist:  Oval Linsey Primary Electrophysiologist:  Novi Calia Meredith Leeds, MD    Chief Complaint: atrial fibrillation   History of Present Illness: [name] is a 81 y.o. female who is being seen today for the evaluation of AF at the request of [name], MD. Presenting today for electrophysiology evaluation.  He has a history significant for persistent atrial fibrillation post ablation, bradycardia, CKD stage III, mitral regurgitation, aortic regurgitation, hypertension, hyperlipidemia.  She had an ablation at [facility] potentially 10 years ago.  She continued to have episodes of atrial fibrillation.  She has since been loaded on amiodarone.  Today, denies symptoms of palpitations, chest pain, shortness of breath, orthopnea, PND, lower extremity edema, claudication, dizziness, presyncope, syncope, bleeding, or neurologic sequela. The patient is tolerating medications without difficulties.  She feels well.  She has no chest pain or shortness of breath.  Stable to all her daily activities.  She would like to go back to the gym and workout.    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 cath 2/09: no CAD, EF 70%   GERD (gastroesophageal reflux disease) 07/26/2021   HYPERLIPIDEMIA    HYPERTENSION    Mild Aortic insufficiency    Moderate mitral regurgitation    Moderate tricuspid regurgitation    OSTEOPENIA    PAF (paroxysmal atrial fibrillation) ([place])    a. s/p ablation 2013; b. CHADS2VASc -> 4 (HTN, age x 2, female)-->Eliquis.   PAT (paroxysmal atrial tachycardia) ([place])    a. 04/2019 Zio: Occas PACs and rare PVCs. 21 atrial runs - longest 20 beats, max rate 169.   Pneumonia 2009   RA (rheumatoid  arthritis) ([facility])    Sinus Bradycardia    a. asymptomatic but prevents use of AVN blocking agents; b. 04/2019 Zio: Avg HR 61 (37-109).   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 05/2019 Echo: EF 60-65%. DD. RVSP 41.66mHg. Mod dil LA. Mod MR/TR. Mild AI.   Past Surgical History:  Procedure Laterality Date   ABLATION OF DYSRHYTHMIC FOCUS     CARDIAC CATHETERIZATION     COLONOSCOPY  04/18/08   Partial hysterectomy--1979     Thoracentesis   12/20/07       Current Outpatient Medications  Medication Sig Dispense Refill   acetaminophen (TYLENOL) 325 MG tablet Take 325 mg by mouth every 6 (six) hours as needed for mild pain.     amiodarone (PACERONE) 200 MG tablet TAKE 1/2 TABLET (100 MG TOTAL) BY MOUTH DAILY 45 tablet 2   apixaban (ELIQUIS) 2.5 MG TABS tablet Take 1 tablet (2.5 mg total) by mouth 2 (two) times daily. 180 tablet 3   D 1000 25 MCG (1000 UT) capsule TAKE 1 CAPSULE BY MOUTH AT BEDTIME. 30 capsule 3   furosemide (LASIX) 40 MG tablet Take 20 mg by mouth as needed for fluid or edema.     hydrALAZINE (APRESOLINE) 50 MG tablet Take 1 tablet (50 mg total) by mouth every 8 (eight) hours. 270 tablet 3   LUMIGAN 0.01 % SOLN SMARTSIG:In Eye(s)     pantoprazole (PROTONIX) 40 MG tablet TAKE 1 TABLET BY MOUTH ONCE DAILY 30 tablet  11   POTASSIUM PO Take 20 mEq by mouth.     promethazine (PHENERGAN) 25 MG tablet Take 1 tablet (25 mg total) by mouth every 8 (eight) hours as needed for nausea or vomiting. 20 tablet 0   rosuvastatin (CRESTOR) 10 MG tablet TAKE 1 TABLET BY MOUTH ONCE DAILY 90 tablet 2   No current facility-administered medications for this visit.    Allergies:   Amlodipine, Celecoxib, and Tramadol   Social History:  The patient  reports that she has never smoked. She has never used smokeless tobacco. She reports that she does not drink alcohol and does not use drugs.   Family History:  The patient's family history includes Atrial fibrillation in her brother;  Breast cancer (age of onset: 43) in her paternal aunt; Diabetes in an other family member; Heart attack in her brother; Heart disease in her brother and mother; Pancreatic cancer in her father.   ROS:  Please see the history of present illness.   Otherwise, review of systems is positive for none.   All other systems are reviewed and negative.   PHYSICAL EXAM: VS:  BP (!) 140/72   Pulse (!) 53   Ht '5\' 3"'$  (1.6 m)   Wt 135 lb 12.8 oz (61.6 kg)   SpO2 99%   BMI 24.06 kg/m  , BMI Body mass index is 24.06 kg/m. GEN: Well nourished, well developed, in no acute distress  HEENT: normal  Neck: no JVD, carotid bruits, or masses Cardiac: RRR; no murmurs, rubs, or gallops,no edema  Respiratory:  clear to auscultation bilaterally, normal work of breathing GI: soft, nontender, nondistended, + BS MS: no deformity or atrophy  Skin: warm and dry Neuro:  Strength and sensation are intact Psych: euthymic mood, full affect  EKG:  EKG is ordered today. Personal review of the ekg ordered shows sinus rhythm   Recent Labs: 03/22/2022: ALT 21; B Natriuretic Peptide 102.4 03/23/2022: TSH 0.645 03/24/2022: Magnesium 2.4 03/29/2022: BUN 32; Creatinine, Ser 2.25; Potassium 4.4; Sodium 145 04/18/2022: Hemoglobin 9.7; Platelets 149    Lipid Panel     Component Value Date/Time   CHOL 205 (H) 08/18/2021 0325   TRIG 47 08/18/2021 0325   HDL 78 08/18/2021 0325   CHOLHDL 2.6 08/18/2021 0325   VLDL 9 08/18/2021 0325   LDLCALC 118 (H) 08/18/2021 0325   LDLDIRECT 104.4 11/26/2012 0746     Wt Readings from Last 3 Encounters:  06/27/22 135 lb 12.8 oz (61.6 kg)  05/12/22 139 lb (63 kg)  04/18/22 141 lb 14.4 oz (64.4 kg)      Other studies Reviewed: Additional studies/ records that were reviewed today include: TTE 05/14/19  Review of the above records today demonstrates:   1. The left ventricle has normal systolic function with an ejection  fraction of 60-65%. The cavity size was normal. There is mildly  increased  left ventricular wall thickness. Left ventricular diastolic Doppler  parameters are consistent with  pseudonormalization.   2. The right ventricle has normal systolic function. The cavity was  normal. There is no increase in right ventricular wall thickness. Right  ventricular systolic pressure is mildly elevated with an estimated  pressure of 41.7 mmHg.   3. Left atrial size was moderately dilated.   4. Incompletely evaluated hypoechoic structure noted in the liver, most  likely a cyst.   5. The mitral valve is degenerative. Mild thickening of the mitral valve  leaflet. Mitral valve regurgitation is moderate by color flow Doppler.  6. Tricuspid valve regurgitation is moderate.   7. The aortic valve has an indeterminate number of cusps. Mild thickening  of the aortic valve. Mild calcification of the aortic valve. Aortic valve  regurgitation is mild by color flow Doppler.   8. The aorta is normal in size and structure.   ASSESSMENT AND PLAN:  1.  Persistent atrial fibrillation: Currently on Eliquis 2.5 mg twice daily, amiodarone 100 mg daily.  High risk medication monitoring for amiodarone performed today.  Recent labs showed no thyroid or liver toxicity.  CHA2DS2-VASc of at least 4.  She is remained in sinus rhythm.  She is happy with her control.  We [name] continue with current management.  2.  Hypertension: Mildly elevated today but usually well controlled.  No changes.  3.  Sick sinus syndrome: Cardiac monitor showed average heart rates in the 50s.  Continue with current management.  Holding rate controlling medications.  4.  Hyperlipidemia: Continue Crestor per primary cardiology  5.  Secondary hypercoagulable state: Currently on Eliquis for atrial fibrillation as above  Current medicines are reviewed at length with the patient today.   The patient does not have concerns regarding her medicines.  The following changes were made today: None  Labs/ tests ordered today  include:  Orders Placed This Encounter  Procedures   EKG 12-Lead     Disposition:   FU 6 months  Signed, [place]hl[place] Meredith Leeds, MD  06/27/2022 11:57 AM     [facility] [address] [place] [address] [phone] (office) [phone] (fax)

## 2022-06-27 NOTE — Patient Instructions (Signed)
Medication Instructions:  Your physician recommends that you continue on your current medications as directed. Please refer to the Current Medication list given to you today.  *If you need a refill on your cardiac medications before your next appointment, please call your pharmacy*   Lab Work: None ordered  Testing/Procedures: None ordered   Follow-Up: At [facility], you and your health needs are our priority.  As part of our continuing mission to provide you with exceptional heart care, we have created designated Provider Care Teams.  These Care Teams include your primary Cardiologist (physician) and Advanced Practice Providers (APPs -  Physician Assistants and Nurse Practitioners) who all work together to provide you with the care you need, when you need it.   Your next appointment:   6 month(s)  The format for your next appointment:   In Person  Provider:   You will see one of the following Advanced Practice Providers on your designated Care Team:   Renee Ursuy, PA-C Michael "Andy" Tillery, PA-C    Thank you for choosing [facility]!!   Aislyn Hayse, RN [phone]  Other Instructions   Important Information About Sugar           

## 2022-07-02 ENCOUNTER — Encounter: Payer: Self-pay | Admitting: Oncology

## 2022-07-06 ENCOUNTER — Encounter: Payer: Self-pay | Admitting: Family Medicine

## 2022-07-06 ENCOUNTER — Ambulatory Visit (INDEPENDENT_AMBULATORY_CARE_PROVIDER_SITE_OTHER): Payer: Medicare Other | Admitting: Family Medicine

## 2022-07-06 VITALS — BP 130/78 | HR 63 | Temp 97.3°F | Ht 63.0 in | Wt 139.0 lb

## 2022-07-06 DIAGNOSIS — R609 Edema, unspecified: Secondary | ICD-10-CM

## 2022-07-06 DIAGNOSIS — R600 Localized edema: Secondary | ICD-10-CM

## 2022-07-06 LAB — CBC WITH DIFFERENTIAL/PLATELET
Basophils Absolute: 0 10*3/uL (ref 0.0–0.1)
Basophils Relative: 0.3 % (ref 0.0–3.0)
Eosinophils Absolute: 0.1 10*3/uL (ref 0.0–0.7)
Eosinophils Relative: 1.7 % (ref 0.0–5.0)
HCT: 28.9 % — ABNORMAL LOW (ref 36.0–46.0)
Hemoglobin: 9.8 g/dL — ABNORMAL LOW (ref 12.0–15.0)
Lymphocytes Relative: 26.6 % (ref 12.0–46.0)
Lymphs Abs: 1.1 10*3/uL (ref 0.7–4.0)
MCHC: 33.9 g/dL (ref 30.0–36.0)
MCV: 94 fl (ref 78.0–100.0)
Monocytes Absolute: 0.3 10*3/uL (ref 0.1–1.0)
Monocytes Relative: 7.1 % (ref 3.0–12.0)
Neutro Abs: 2.6 10*3/uL (ref 1.4–7.7)
Neutrophils Relative %: 64.3 % (ref 43.0–77.0)
Platelets: 146 10*3/uL — ABNORMAL LOW (ref 150.0–400.0)
RBC: 3.07 Mil/uL — ABNORMAL LOW (ref 3.87–5.11)
RDW: 13.7 % (ref 11.5–15.5)
WBC: 4.1 10*3/uL (ref 4.0–10.5)

## 2022-07-06 LAB — COMPREHENSIVE METABOLIC PANEL
ALT: 13 U/L (ref 0–35)
AST: 19 U/L (ref 0–37)
Albumin: 4.3 g/dL (ref 3.5–5.2)
Alkaline Phosphatase: 52 U/L (ref 39–117)
BUN: 33 mg/dL — ABNORMAL HIGH (ref 6–23)
CO2: 27 mEq/L (ref 19–32)
Calcium: 9.6 mg/dL (ref 8.4–10.5)
Chloride: 104 mEq/L (ref 96–112)
Creatinine, Ser: 1.95 mg/dL — ABNORMAL HIGH (ref 0.40–1.20)
GFR: 23.95 mL/min — ABNORMAL LOW (ref >60.00)
Glucose, Bld: 85 mg/dL (ref 70–99)
Potassium: 3.5 mEq/L (ref 3.5–5.1)
Sodium: 140 mEq/L (ref 135–145)
Total Bilirubin: 0.8 mg/dL (ref 0.2–1.2)
Total Protein: 7.5 g/dL (ref 6.0–8.3)

## 2022-07-06 LAB — BRAIN NATRIURETIC PEPTIDE: Pro B Natriuretic peptide (BNP): 323 pg/mL — ABNORMAL HIGH (ref 0.0–100.0)

## 2022-07-06 NOTE — Assessment & Plan Note (Signed)
Acute, likely multifactorial due to diastolic heart failure, chronic kidney disease and varicose veins. No sign and symptom of DVT/PE. Lungs clear to auscultation... Will evaluate BNP for fluid overload from heart. We will reevaluate chronic kidney disease but if not worsening will consider Lasix for 3-day course 20 mg daily. Given peripheral swelling is significantly better following elevation overnight there is likely a significant venous insufficiency etiology.  She will start wearing compression hose and will continue to elevate her feet above her heart.  I encouraged her to continue to work on regular exercise and avoid prolonged standing or sitting.

## 2022-07-06 NOTE — Progress Notes (Signed)
Patient ID: [name], female    DOB: 05-31-1941, 81 y.o.   MRN: [id number]  This visit was conducted in person.  BP 130/78 (BP Location: Left Arm, Patient Position: Sitting, Cuff Size: Normal)   Pulse 63   Temp (!) 97.3 F (36.3 C) (Temporal)   Ht '5\' 3"'$  (1.6 m)   Wt 139 lb (63 kg)   SpO2 97%   BMI 24.62 kg/m    CC:  Chief Complaint  Patient presents with   Acute Visit    Right foot swelling since 07/03/22    Subjective:   HPI: [name] is a 81 y.o. female with history of persistent atrial fibrillation, chronic diastolic congestive heart failure, chronic renal disease, hypertension presenting on 07/06/2022 for Acute Visit (Right foot swelling since 07/03/22)  She reports she is noted swelling in her  both feet since July 03, 2022.   Some time, an now right is swollen without left.She denies any fall or known injury.  No change in activity.  No new medications. She does take Lasix 20 mg as needed for peripheral edema... took some Sunday. Cause urination but swelling came back.  No pain  in foot or leg. She denies shortness of breath , no Cp.  No redness no pain in joint.  Improves overnight with elevation and then returns.  Wt Readings from Last 3 Encounters:  07/06/22 139 lb (63 kg)  06/27/22 135 lb 12.8 oz (61.6 kg)  05/12/22 139 lb (63 kg)   Body mass index is 24.62 kg/m.     NO change in diet, no cahnge in salt intake, no EOTH.      Relevant past medical, surgical, family and social history reviewed and updated as indicated. Interim medical history since our last visit reviewed. Allergies and medications reviewed and updated. Outpatient Medications Prior to Visit  Medication Sig Dispense Refill   acetaminophen (TYLENOL) 325 MG tablet Take 325 mg by mouth every 6 (six) hours as needed for mild pain.     amiodarone (PACERONE) 200 MG tablet TAKE 1/2 TABLET (100 MG TOTAL) BY MOUTH DAILY 45 tablet 2   apixaban (ELIQUIS) 2.5 MG TABS tablet Take  1 tablet (2.5 mg total) by mouth 2 (two) times daily. 180 tablet 3   D 1000 25 MCG (1000 UT) capsule TAKE 1 CAPSULE BY MOUTH AT BEDTIME. 30 capsule 3   furosemide (LASIX) 40 MG tablet Take 20 mg by mouth as needed for fluid or edema.     hydrALAZINE (APRESOLINE) 50 MG tablet Take 1 tablet (50 mg total) by mouth every 8 (eight) hours. 270 tablet 3   LUMIGAN 0.01 % SOLN SMARTSIG:In Eye(s)     pantoprazole (PROTONIX) 40 MG tablet TAKE 1 TABLET BY MOUTH ONCE DAILY 30 tablet 11   POTASSIUM PO Take 20 mEq by mouth.     promethazine (PHENERGAN) 25 MG tablet Take 1 tablet (25 mg total) by mouth every 8 (eight) hours as needed for nausea or vomiting. 20 tablet 0   rosuvastatin (CRESTOR) 10 MG tablet TAKE 1 TABLET BY MOUTH ONCE DAILY 90 tablet 2   No facility-administered medications prior to visit.     Per HPI unless specifically indicated in ROS section below Review of Systems  Constitutional:  Negative for fatigue and fever.  HENT:  Negative for congestion.   Eyes:  Negative for pain.  Respiratory:  Negative for cough and shortness of breath.   Cardiovascular:  Positive for leg swelling. Negative for chest  pain and palpitations.  Gastrointestinal:  Negative for abdominal pain.  Genitourinary:  Negative for dysuria and vaginal bleeding.  Musculoskeletal:  Negative for back pain.  Neurological:  Negative for syncope, light-headedness and headaches.  Psychiatric/Behavioral:  Negative for dysphoric mood.    Objective:  BP 130/78 (BP Location: Left Arm, Patient Position: Sitting, Cuff Size: Normal)   Pulse 63   Temp (!) 97.3 F (36.3 C) (Temporal)   Ht '5\' 3"'$  (1.6 m)   Wt 139 lb (63 kg)   SpO2 97%   BMI 24.62 kg/m   Wt Readings from Last 3 Encounters:  07/06/22 139 lb (63 kg)  06/27/22 135 lb 12.8 oz (61.6 kg)  05/12/22 139 lb (63 kg)      Physical Exam Constitutional:      General: She is not in acute distress.    Appearance: Normal appearance. She is well-developed. She is not  ill-appearing or toxic-appearing.  HENT:     Head: Normocephalic.     Right Ear: Hearing, tympanic membrane, ear canal and external ear normal. Tympanic membrane is not erythematous, retracted or bulging.     Left Ear: Hearing, tympanic membrane, ear canal and external ear normal. Tympanic membrane is not erythematous, retracted or bulging.     Nose: No mucosal edema or rhinorrhea.     Right Sinus: No maxillary sinus tenderness or frontal sinus tenderness.     Left Sinus: No maxillary sinus tenderness or frontal sinus tenderness.     Mouth/Throat:     Pharynx: Uvula midline.  Eyes:     General: Lids are normal. Lids are everted, no foreign bodies appreciated.     Conjunctiva/sclera: Conjunctivae normal.     Pupils: Pupils are equal, round, and reactive to light.  Neck:     Thyroid: No thyroid mass or thyromegaly.     Vascular: No carotid bruit.     Trachea: Trachea normal.  Cardiovascular:     Rate and Rhythm: Normal rate and regular rhythm.     Pulses: Normal pulses.     Heart sounds: Normal heart sounds, S1 normal and S2 normal. No murmur heard.    No friction rub. No gallop.     Comments:  No ttp over calf  bilaterally Pulmonary:     Effort: Pulmonary effort is normal. No tachypnea or respiratory distress.     Breath sounds: Normal breath sounds. No decreased breath sounds, wheezing, rhonchi or rales.  Abdominal:     General: Bowel sounds are normal.     Palpations: Abdomen is soft.     Tenderness: There is no abdominal tenderness.  Musculoskeletal:     Cervical back: Normal range of motion and neck supple.     Right lower leg: 1+ Edema present.     Left lower leg: No edema.  Skin:    General: Skin is warm and dry.     Findings: No rash.  Neurological:     Mental Status: She is alert.  Psychiatric:        Mood and Affect: Mood is not anxious or depressed.        Speech: Speech normal.        Behavior: Behavior normal. Behavior is cooperative.        Thought Content:  Thought content normal.        Judgment: Judgment normal.       Results for orders placed or performed in visit on 04/18/22  Iron and TIBC  Result Value Ref Range  Iron 62 28 - 170 ug/dL   TIBC 319 250 - 450 ug/dL   Saturation Ratios 19 10.4 - 31.8 %   UIBC 257 ug/dL  Ferritin  Result Value Ref Range   Ferritin 80 11 - 307 ng/mL  CBC  Result Value Ref Range   WBC 5.0 4.0 - 10.5 K/uL   RBC 3.08 (L) 3.87 - 5.11 MIL/uL   Hemoglobin 9.7 (L) 12.0 - 15.0 g/dL   HCT 29.1 (L) 36.0 - 46.0 %   MCV 94.5 80.0 - 100.0 fL   MCH 31.5 26.0 - 34.0 pg   MCHC 33.3 30.0 - 36.0 g/dL   RDW 13.8 11.5 - 15.5 %   Platelets 149 (L) 150 - 400 K/uL   nRBC 0.0 0.0 - 0.2 %     COVID 19 screen:  No recent travel or known exposure to COVID19 The patient denies respiratory symptoms of COVID 19 at this time. The importance of social distancing was discussed today.   Assessment and Plan Problem List Items Addressed This Visit     Peripheral edema - Primary    Acute, likely multifactorial due to diastolic heart failure, chronic kidney disease and varicose veins. No sign and symptom of DVT/PE. Lungs clear to auscultation... Will evaluate BNP for fluid overload from heart. We will reevaluate chronic kidney disease but if not worsening will consider Lasix for 3-day course 20 mg daily. Given peripheral swelling is significantly better following elevation overnight there is likely a significant venous insufficiency etiology.  She will start wearing compression hose and will continue to elevate her feet above her heart.  I encouraged her to continue to work on regular exercise and avoid prolonged standing or sitting.        Relevant Orders   Comprehensive metabolic panel   Brain natriuretic peptide   CBC with Differential/Platelet       Eliezer Lofts, MD

## 2022-07-06 NOTE — Patient Instructions (Addendum)
Please stop at the lab to have labs drawn.  Elevate feet above heart. Start wearing compression hose.

## 2022-07-19 ENCOUNTER — Other Ambulatory Visit: Payer: Self-pay | Admitting: Family Medicine

## 2022-07-19 ENCOUNTER — Ambulatory Visit (HOSPITAL_BASED_OUTPATIENT_CLINIC_OR_DEPARTMENT_OTHER): Payer: Medicare Other | Admitting: Cardiovascular Disease

## 2022-07-19 ENCOUNTER — Encounter (HOSPITAL_BASED_OUTPATIENT_CLINIC_OR_DEPARTMENT_OTHER): Payer: Self-pay | Admitting: Cardiovascular Disease

## 2022-07-19 ENCOUNTER — Encounter: Payer: Self-pay | Admitting: Oncology

## 2022-07-19 ENCOUNTER — Other Ambulatory Visit (HOSPITAL_BASED_OUTPATIENT_CLINIC_OR_DEPARTMENT_OTHER): Payer: Self-pay

## 2022-07-19 DIAGNOSIS — I10 Essential (primary) hypertension: Secondary | ICD-10-CM

## 2022-07-19 DIAGNOSIS — I5033 Acute on chronic diastolic (congestive) heart failure: Secondary | ICD-10-CM | POA: Diagnosis not present

## 2022-07-19 DIAGNOSIS — I4819 Other persistent atrial fibrillation: Secondary | ICD-10-CM

## 2022-07-19 DIAGNOSIS — I160 Hypertensive urgency: Secondary | ICD-10-CM | POA: Insufficient documentation

## 2022-07-19 HISTORY — DX: Hypertensive urgency: I16.0

## 2022-07-19 LAB — BASIC METABOLIC PANEL
BUN/Creatinine Ratio: 15 (ref 12–28)
BUN: 27 mg/dL (ref 8–27)
CO2: 21 mmol/L (ref 20–29)
Calcium: 9.9 mg/dL (ref 8.7–10.3)
Chloride: 104 mmol/L (ref 96–106)
Creatinine, Ser: 1.8 mg/dL — ABNORMAL HIGH (ref 0.57–1.00)
Glucose: 83 mg/dL (ref 70–99)
Potassium: 4.1 mmol/L (ref 3.5–5.2)
Sodium: 140 mmol/L (ref 134–144)
eGFR: 28 mL/min/{1.73_m2} — ABNORMAL LOW (ref >59)

## 2022-07-19 LAB — CBC WITH DIFFERENTIAL/PLATELET
Basophils Absolute: 0.1 10*3/uL (ref 0.0–0.2)
Basos: 1 %
EOS (ABSOLUTE): 0.1 10*3/uL (ref 0.0–0.4)
Eos: 2 %
Hematocrit: 31.2 % — ABNORMAL LOW (ref 34.0–46.6)
Hemoglobin: 10.5 g/dL — ABNORMAL LOW (ref 11.1–15.9)
Immature Grans (Abs): 0 10*3/uL (ref 0.0–0.1)
Immature Granulocytes: 0 %
Lymphocytes Absolute: 1.3 10*3/uL (ref 0.7–3.1)
Lymphs: 27 %
MCH: 31.3 pg (ref 26.6–33.0)
MCHC: 33.7 g/dL (ref 31.5–35.7)
MCV: 93 fL (ref 79–97)
Monocytes Absolute: 0.3 10*3/uL (ref 0.1–0.9)
Monocytes: 7 %
Neutrophils Absolute: 2.9 10*3/uL (ref 1.4–7.0)
Neutrophils: 63 %
Platelets: 159 10*3/uL (ref 150–450)
RBC: 3.35 x10E6/uL — ABNORMAL LOW (ref 3.77–5.28)
RDW: 13.2 % (ref 11.7–15.4)
WBC: 4.7 10*3/uL (ref 3.4–10.8)

## 2022-07-19 MED ORDER — HYDRALAZINE HCL 50 MG PO TABS
50.0000 mg | ORAL_TABLET | Freq: Three times a day (TID) | ORAL | 0 refills | Status: DC | PRN
  Filled 2022-07-19: qty 30, 10d supply, fill #0

## 2022-07-19 MED ORDER — APIXABAN 2.5 MG PO TABS: 2.5000 mg | ORAL_TABLET | Freq: Two times a day (BID) | ORAL | 3 refills | Status: DC

## 2022-07-19 MED ORDER — FUROSEMIDE 40 MG PO TABS: 20.0000 mg | ORAL_TABLET | ORAL | 3 refills | Status: DC | PRN

## 2022-07-19 MED ORDER — ROSUVASTATIN CALCIUM 10 MG PO TABS: 10.0000 mg | ORAL_TABLET | Freq: Every day | ORAL | 3 refills | Status: DC

## 2022-07-19 MED ORDER — HYDRALAZINE HCL 50 MG PO TABS: 50.0000 mg | ORAL_TABLET | Freq: Three times a day (TID) | ORAL | 3 refills | Status: DC

## 2022-07-19 MED ORDER — AMIODARONE HCL 200 MG PO TABS: ORAL_TABLET | ORAL | 3 refills | Status: DC

## 2022-07-19 NOTE — Telephone Encounter (Signed)
Last office visit 07/06/22 for peripheral edema.  Refill states:  The original prescription was discontinued on 03/24/2022 by Sharen Hones, MD for the following reason: Stop Taking at Discharge. Renewing this prescription may not be appropriate.    Refill?

## 2022-07-19 NOTE — Patient Instructions (Addendum)
Medication Instructions:  START HYDRALAZINE 50 MG EVERY 8 HOURS AS NEEDED FOR BLOOD PRESSURE ABOVE 150   *If you need a refill on your cardiac medications before your next appointment, please call your pharmacy*  Lab Work: CBC/BMET TODAY   If you have labs (blood work) drawn today and your tests are completely normal, you will receive your results only by: [place] (if you have MyChart) OR A paper copy in the mail If you have any lab test that is abnormal or we need to change your treatment, we will call you to review the results.  Testing/Procedures: NONE  Follow-Up: At [facility], you and your health needs are our priority.  As part of our continuing mission to provide you with exceptional heart care, we have created designated Provider Care Teams.  These Care Teams include your primary Cardiologist (physician) and Advanced Practice Providers (APPs -  Physician Assistants and Nurse Practitioners) who all work together to provide you with the care you need, when you need it.  We recommend signing up for the patient portal called "MyChart".  Sign up information is provided on this After Visit Summary.  MyChart is used to connect with patients for Virtual Visits (Telemedicine).  Patients are able to view lab/test results, encounter notes, upcoming appointments, etc.  Non-urgent messages can be sent to your provider as well.   To learn more about what you can do with MyChart, go to [email or web address].    Your next appointment:   08/02/2022 8:00 AM VIRTUAL VISIT WITH DR Adams   Other Instructions GET LAB WORK THEN DOWNSTAIRS TO PICK UP RX. TAKE A HYDRALAZINE AND COME BACK TO OFFICE FOR BLOOD PRESSURE CHECK

## 2022-07-19 NOTE — Assessment & Plan Note (Signed)
Blood pressure is very elevated today both initially and on repeat.  She is not feeling well.  She has not been checking it at home as often so it is hard to know what it has been doing.  Previously we tried to increase her blood pressure medicine she had worsening renal function and also had hypotension with presyncope.  We have given her additional hydralazine to take for SBP greater than 150.  We will check a BMP and a CBC as she has history of acute on chronic renal failure and anemia.  If her blood pressures are not better by this evening she will need to be seen in urgent care or the hospital.

## 2022-07-19 NOTE — Assessment & Plan Note (Addendum)
She is maintaining sinus rhythm on amiodarone.  She follows with [name].  Continue Eliquis, amiodarone, and no nodal agents given her bradycardia.  She does have metoprolol to take on an as-needed basis for A-fib with RVR.

## 2022-07-19 NOTE — Progress Notes (Signed)
Cardiology Office Note:   Date:  07/19/2022   ID:  [name], DOB 08/05/1941, MRN [id number]  PCP:  [name], MD  Cardiologist:  [name], MD  Electrophysiologist:  Constance Haw, MD   Evaluation Performed:  Follow-Up Visit  Chief Complaint:  hypertension  History of Present Illness:    [name] is a 81 y.o. female with a hx of persistent atrial fibrillation status post ablation, bradycardia, CKD 2, mitral regurgitation, aortic regurgitation, hypertension, and hyperlipidemia here for follow up. She had an echo 05/14/2019 that revealed LVEF 60 to 65% with grade 2 diastolic dysfunction and mildly elevated pulmonary pressures.  She was initially seen 12/2020 in the hypertension clinic.  She was first diagnosed with hypertension many decades ago.  In the past it was well-controlled.  However in the last year she has struggled.  Lately her blood pressure has been in the 160s at home.  She last saw [name] on 11/22/2019.  Prior to that losartan was increased to 50 mg.  Her blood pressure remained above goal since making that change.  [name] increased losartan to '100mg'$ .  She followed up with [name], NP, and hydralazine '25mg'$  bid was added on 2/26.  At her first visit hydralazine was increased.  She saw [name], NP on 12/2019 an dit was increased to '75mg'$ .  She started to work out at the [employer].  She was seen in the ED with palpitations 03/2020.  She was in sinus rhythm at the time.  This had resolved.   It was noted that she had inadvertently stopped taking her hydrochlorothiazide. She this was resumed when she saw her pharmacist on 03/2020.  Hydralazine was reduced back to 50 mg twice daily.  HCTZ was changed to chlorthalidone. Her lipids were poorly controlled so she was started to rosuvastatin. 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 She reported palpitations but was noted to be in sinus rhythm at that time.  Her renal function was  worsening so chlorthalidone was reduced and she was started on clonidine.  Renal artery dopplers were normal 08/2021.  In 08/2021 she was seen in the hospital.  She reported shortness of breath and chest pain.  She also had increasing lower extremity edema.  She was noted to be in atrial fibrillation with RVR in the 90s to 150s.  She was given IV diltiazem and IV metoprolol.  High-sensitivity troponin was minimally elevated 22 and flat.  It was thought to be demand ischemia in the setting of A. fib with RVR.  With the initiation of IV diltiazem her heart rate quickly improved.  She was started on metoprolol 12.5 mg twice daily.  Her home hydralazine was held and she was asked to follow-up closely with cardiology. When she followed up in clinic she was bradycardic.  Clonidine and metoprolol were both discontinued she was instructed to take the metoprolol only as needed.  She was continued on losartan and chlorthalidone.  She followed up with EP and remained in sinus rhythm.  Given her chronic kidney disease she was not a candidate for dofetilide.  They discussed ablation versus amiodarone.  She was started on a 2-week monitor to better assess her heart rate and rhythm. She had 20 runs of SVT up to 17 beats. Her average heart rate was 53 bpm. She did have some overnight bradycardia to the mid 30s.   She was feeling better, with well controlled blood pressure  and consistent heart rates in the 40s. She saw [name] 10/12/21 and was doing well. It was noted she had continued episodes of Afib but had been in sinus rhythm for most of the interim. She was started on 200 mg amiodarone twice daily for 1 month followed by 200 mg daily. On 11/18/2021 she presented to the ED 1 week after a head injury due to persistent pain and raised lesion. She had accidentally struck her occiput on the underside of a cabinet. Workup revealed a small, non-bleeding occipital hematoma suitable for outpatient management.   She saw her PCP  and chlorthalidone was discontinued 2/2 AKI.  After that she started having lower extremity edema and her blood pressures increased.  She was seen in the ED 02/14/2022.  She also had mild increased exertional dyspnea.  BNP was 244.  Cardiac enzymes were negative.  She was given Lasix 20 mg to be taken for 5 days.  She had not had any improvement in her edema.  Her home blood pressures ranged 110s to 150s over 60s to 70s. Hydralazine was increased.  She saw her PCP week and her blood pressure was 110/60.  Furosemide was reduced to 20 mg.  She followed up again 5/25 and continued to have nausea and decreased appetite.  She stopped the furosemide and felt better.  She was seen in the hospital 03/2022 with near syncope in the setting of intravascular volume depletion. Although she was bradycardic this was not thought to be the main cause. She had AKI that improved with IV fluids. She remained in sinus rhythm on amiodarone, though she did struggle with nausea. She followed up with [name] 06/2022 and was stable. She saw her PCP 07/06/22 and noted increased LE edema. They recommended that she take Lasix for 3 days. BNP was 323; creatinine was stable at 1.95.  Today, she is accompanied by a family member. She states that she is not feeling very good lately. In general she is feeling sluggish with headaches. Also, she has noticed episodes of brief heart flutters. She believes it is likely she is back in atrial fibrillation. Her LE edema has improved at this time after taking her Lasix. At baseline she has chills that she attributes to being on anticoagulation. Her blood pressure was higher than usual this morning at home. In clinic today her BP is 190/86 (184/68 on recheck). She did take her antihypertensives this morning. She last followed up with her nephrologist about a month ago where it was noted her kidney function improved. She denies any chest pain, shortness of breath, or peripheral edema. No lightheadedness,  syncope, orthopnea, or PND.  Previous antihypertensives: Amlodipine- ankle swelling Spironolactone- unsure why it was stopped Hydralazine- swelling with '100mg'$ , OK with '50mg'$  Clonidine- worked well.  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 cath 2/09: no CAD, EF 70%   GERD (gastroesophageal reflux disease) 07/26/2021   HYPERLIPIDEMIA    HYPERTENSION    Hypertensive urgency 07/19/2022   Mild Aortic insufficiency    Moderate mitral regurgitation    Moderate tricuspid regurgitation    OSTEOPENIA    PAF (paroxysmal atrial fibrillation) ([place])    a. s/p ablation 2013; b. CHADS2VASc -> 4 (HTN, age x 2, female)-->Eliquis.   PAT (paroxysmal atrial  tachycardia)    a. 04/2019 Zio: Occas PACs and rare PVCs. 21 atrial runs - longest 20 beats, max rate 169.   Pneumonia 2009   RA (rheumatoid arthritis) (HCC)    Sinus Bradycardia    a. asymptomatic but prevents use of AVN blocking agents; b. 04/2019 Zio: Avg HR 61 (37-109).   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 05/2019 Echo: EF 60-65%. DD. RVSP 41.25mHg. Mod dil LA. Mod MR/TR. Mild AI.    Past Surgical History:  Procedure Laterality Date   ABLATION OF DYSRHYTHMIC FOCUS     CARDIAC CATHETERIZATION     COLONOSCOPY  04/18/08   Partial hysterectomy--1979     Thoracentesis   12/20/07      Current Medications: Current Meds  Medication Sig   acetaminophen (TYLENOL) 325 MG tablet Take 325 mg by mouth every 6 (six) hours as needed for mild pain.   D 1000 25 MCG (1000 UT) capsule TAKE 1 CAPSULE BY MOUTH AT BEDTIME.   hydrALAZINE (APRESOLINE) 50 MG tablet Take 1 tablet (50 mg total) by mouth every 8 (eight) hours as needed. FOR BLOOD PRESSURE ABOVE 150   LUMIGAN 0.01 % SOLN SMARTSIG:In Eye(s)   pantoprazole (PROTONIX) 40 MG  tablet TAKE 1 TABLET BY MOUTH ONCE DAILY   POTASSIUM PO Take 20 mEq by mouth.   promethazine (PHENERGAN) 25 MG tablet Take 1 tablet (25 mg total) by mouth every 8 (eight) hours as needed for nausea or vomiting.   [DISCONTINUED] amiodarone (PACERONE) 200 MG tablet TAKE 1/2 TABLET (100 MG TOTAL) BY MOUTH DAILY   [DISCONTINUED] apixaban (ELIQUIS) 2.5 MG TABS tablet Take 1 tablet (2.5 mg total) by mouth 2 (two) times daily.   [DISCONTINUED] furosemide (LASIX) 40 MG tablet Take 20 mg by mouth as needed for fluid or edema.   [DISCONTINUED] hydrALAZINE (APRESOLINE) 50 MG tablet Take 1 tablet (50 mg total) by mouth every 8 (eight) hours.   [DISCONTINUED] rosuvastatin (CRESTOR) 10 MG tablet TAKE 1 TABLET BY MOUTH ONCE DAILY     Allergies:   Amlodipine, Celecoxib, and Tramadol   Social History   Socioeconomic History   Marital status: Married    Spouse name: Not on file   Number of children: Not on file   Years of education: Not on file   Highest education level: Not on file  Occupational History   Not on file  Tobacco Use   Smoking status: Never   Smokeless tobacco: Never  Vaping Use   Vaping Use: Never used  Substance and Sexual Activity   Alcohol use: No    Alcohol/week: 0.0 standard drinks of alcohol   Drug use: No   Sexual activity: Not Currently    Birth control/protection: None  Other Topics Concern   Not on file  Social History Narrative   Marital Status: widow x 1 yrChildren: 5, grandchildren 171 numerous great grand 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 Full Code ( reviewed 2015) Occasional exercise.Diet: fruits and veggies, lean meats.   Right handed    Caffeine- none    Social Determinants of Health    Financial Resource Strain: Low Risk  (04/11/2022)   Overall Financial Resource Strain (CARDIA)    Difficulty of Paying Living Expenses: Not hard at all  Food Insecurity: No Food Insecurity (04/11/2022)   Hunger Vital Sign  Worried About Charity fundraiser in the Last Year: Never true    [place] in the Last Year: Never true  Transportation Needs: No Transportation Needs (04/11/2022)   PRAPARE - Hydrologist (Medical): No    Lack of Transportation (Non-Medical): No  Physical Activity: Inactive (04/11/2022)   Exercise Vital Sign    Days of Exercise per Week: 0 days    Minutes of Exercise per Session: 0 min  Stress: No Stress Concern Present (04/11/2022)   [place]    Feeling of Stress : Not at all  Social Connections: Not on file     Family History: The patient's family history includes Atrial fibrillation in her brother; Breast cancer (age of onset: 52) in her paternal aunt; Diabetes in an other family member; Heart attack in her brother; Heart disease in her brother and mother; Pancreatic cancer in her father.  ROS:   Please see the history of present illness.    (+) Fatigue/Malaise (+) Headaches (+) Palpitations (+) Chills All other systems reviewed and are negative.  EKGs/Labs/Other Studies Reviewed:    EKG:    EKG is personally reviewed. 07/19/2022:  Sinus bradycardia. Rate 52 bpm. Nonspecific ST changes. 12/02/2021: Sinus bradycardia. Rate 49 bpm. 08/19/2021: Ectopic atrial bradycardia.  Rate 47 bpm. 07/26/2021: Sinus rhythm Rate 65 03/23/2021: EKG was not ordered. 11/27/2020: Sinus rhythm.  Rate 60 bpm. 12/06/19: sinus bradycardia.  Rate 53 bpm.  Non-specific ST-T changes.   MRA Neck  03/22/2022: FINDINGS: Aortic arch: Visualized aortic arch normal caliber with standard branching pattern. No stenosis about the origin of the great vessels.   Right carotid system: Right  common and internal carotid arteries patent without stenosis or evidence for dissection.   Left carotid system: Left common and internal carotid arteries patent without stenosis or evidence for dissection.   Vertebral arteries: Both vertebral arteries arise from the subclavian arteries. No visible proximal subclavian artery stenosis. Vertebral arteries co dominant and patent without stenosis or dissection.   Other: None.   IMPRESSION: Normal MRA of the neck.  Echo 12/29/2021:  1. Left ventricular ejection fraction, by estimation, is 60 to 65%. The  left ventricle has normal function. The left ventricle has no regional  wall motion abnormalities. Left ventricular diastolic parameters are  indeterminate.   2. Right ventricular systolic function is normal. The right ventricular  size is normal. There is mildly elevated pulmonary artery systolic  pressure.   3. Left atrial size was severely dilated.   4. Right atrial size was severely dilated.   5. The mitral valve is degenerative. Moderate mitral valve regurgitation.   6. Tricuspid valve regurgitation is mild to moderate.   7. The aortic valve is grossly normal. Aortic valve regurgitation is mild  to moderate. Aortic valve sclerosis/calcification is present, without any  evidence of aortic stenosis.   8. The inferior vena cava is normal in size with greater than 50%  respiratory variability, suggesting right atrial pressure of 3 mmHg.   Comparison(s): EF 60%, MV thickening w/ moderate MR, mild AI, no AS.   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 No significant changes from prior.   Monitor 09/2021: Patch Wear Time:  12 days and 16 hours   Predominant underlying rhythm was sinus rhythm Less than 1% ventricular and supraventricular ectopy 20 SVT runs, longest 17 beats, fastest 6 beats at 176 bpm No triggered events  noted  Bilateral Renal Artery Doppler 09/08/2021: Summary:   Largest Aortic Diameter: 2.1 cm     Renal:     Right: Abnormal size for the right kidney. Normal cortical thickness         of right kidney. No evidence of right renal artery stenosis.         RRV flow present.  Left:  Cyst(s) noted. LRV flow present. No evidence of left renal         artery stenosis. Abnormal cortical thickness of the left         kidney. Abnormal size for the left kidney.  Mesenteric:  Normal Celiac artery , Superior Mesenteric artery and Inferior Mesenteric  artery findings.   Echo 05/14/19:  1. The left ventricle has normal systolic function with an ejection  fraction of 60-65%. The cavity size was normal. There is mildly increased  left ventricular wall thickness. Left ventricular diastolic Doppler  parameters are consistent with  pseudonormalization.   2. The right ventricle has normal systolic function. The cavity was  normal. There is no increase in right ventricular wall thickness. Right  ventricular systolic pressure is mildly elevated with an estimated  pressure of 41.7 mmHg.   3. Left atrial size was moderately dilated.   4. Incompletely evaluated hypoechoic structure noted in the liver, most  likely a cyst.   5. The mitral valve is degenerative. Mild thickening of the mitral valve  leaflet. Mitral valve regurgitation is moderate by color flow Doppler.   6. Tricuspid valve regurgitation is moderate.   7. The aortic valve has an indeterminate number of cusps. Mild thickening  of the aortic valve. Mild calcification of the aortic valve. Aortic valve  regurgitation is mild by color flow Doppler.   8. The aorta is normal in size and structure.   Recent Labs: 03/22/2022: B Natriuretic Peptide 102.4 03/23/2022: TSH 0.645 03/24/2022: Magnesium 2.4 07/06/2022: ALT 13; BUN 33; Creatinine, Ser 1.95; Hemoglobin 9.8; Platelets 146.0; Potassium 3.5; Pro B Natriuretic peptide (BNP) 323.0; Sodium 140   Recent Lipid Panel    Component Value Date/Time   CHOL 205  (H) 08/18/2021 0325   TRIG 47 08/18/2021 0325   HDL 78 08/18/2021 0325   CHOLHDL 2.6 08/18/2021 0325   VLDL 9 08/18/2021 0325   LDLCALC 118 (H) 08/18/2021 0325   LDLDIRECT 104.4 11/26/2012 0746    Physical Exam:    VS:  BP (!) 184/68 (BP Location: Right Arm, Patient Position: Sitting, Cuff Size: Normal)   Pulse (!) 52   Ht '5\' 3"'$  (1.6 m)   Wt 139 lb 9.6 oz (63.3 kg)   BMI 24.73 kg/m  , BMI Body mass index is 24.73 kg/m. GENERAL:  Well appearing HEENT: Pupils equal round and reactive, fundi not visualized, oral mucosa unremarkable NECK:  No jugular venous distention, waveform within normal limits, carotid upstroke brisk and symmetric, no bruits, no thyromegaly LUNGS:  Clear to auscultation bilaterally HEART:  RRR.  PMI not displaced or sustained,S1 and S2 within normal limits, no S3, no S4, no clicks, no rubs, no murmurs ABD:  Flat, positive bowel sounds normal in frequency in pitch, no bruits, no rebound, no guarding, no midline pulsatile mass, no hepatomegaly, no splenomegaly EXT:  2 plus pulses throughout, no edema, no cyanosis no clubbing SKIN:  No rashes no nodules NEURO:  Cranial nerves II through XII grossly intact, motor grossly intact throughout PSYCH:  Cognitively intact, oriented to person place and time    ASSESSMENT:  1. Essential hypertension   2. Acute on chronic diastolic CHF (congestive heart failure) ([place])   3. Hypertensive urgency   4. Persistent atrial fibrillation (HCC)       PLAN:    Acute on chronic diastolic CHF (congestive heart failure) (HCC) Volume status is stable.  She has done much better since she took the extra furosemide but has not needed it recently.  She appears euvolemic on exam.  Hypertensive urgency Blood pressure is very elevated today both initially and on repeat.  She is not feeling well.  She has not been checking it at home as often so it is hard to know what it has been doing.  Previously we tried to increase her blood  pressure medicine she had worsening renal function and also had hypotension with presyncope.  We have given her additional hydralazine to take for SBP greater than 150.  We will check a BMP and a CBC as she has history of acute on chronic renal failure and anemia.  If her blood pressures are not better by this evening she will need to be seen in urgent care or the hospital.   Persistent atrial fibrillation She is maintaining sinus rhythm on amiodarone.  She follows with [name].  Continue Eliquis, amiodarone, and no nodal agents given her bradycardia.  She does have metoprolol to take on an as-needed basis for A-fib with RVR.   Disposition:  FU with Lian Pounds C. Oval Linsey, MD, [facility] in 2 weeks with virtual visit.  Medication Adjustments/Labs and Tests Ordered: Current medicines are reviewed at length with the patient today.  Concerns regarding medicines are outlined above.   Orders Placed This Encounter  Procedures   CBC with Differential/Platelet   Basic metabolic panel   Meds ordered this encounter  Medications   furosemide (LASIX) 40 MG tablet    Sig: Take 0.5 tablets (20 mg total) by mouth as needed for fluid or edema.    Dispense:  30 tablet    Refill:  3   amiodarone (PACERONE) 200 MG tablet    Sig: TAKE 1/2 TABLET (100 MG TOTAL) BY MOUTH DAILY    Dispense:  45 tablet    Refill:  3   apixaban (ELIQUIS) 2.5 MG TABS tablet    Sig: Take 1 tablet (2.5 mg total) by mouth 2 (two) times daily.    Dispense:  180 tablet    Refill:  3    NEW DOSE, D/C 5 MG RX   hydrALAZINE (APRESOLINE) 50 MG tablet    Sig: Take 1 tablet (50 mg total) by mouth every 8 (eight) hours.    Dispense:  270 tablet    Refill:  3   rosuvastatin (CRESTOR) 10 MG tablet    Sig: Take 1 tablet (10 mg total) by mouth daily.    Dispense:  90 tablet    Refill:  3   hydrALAZINE (APRESOLINE) 50 MG tablet    Sig: Take 1 tablet (50 mg total) by mouth every 8 (eight) hours as needed. FOR BLOOD PRESSURE ABOVE 150     Dispense:  30 tablet    Refill:  0   I,Mathew Stumpf,acting as a scribe for [name], MD.,have documented all relevant documentation on the behalf of [name], MD,as directed by  [name], MD while in the presence of [name], MD.  I, [place] Oval Linsey, MD have reviewed all documentation for this visit.  The documentation of the exam, diagnosis, procedures, and orders on 07/19/2022 are all accurate  and complete.   Signed, [name], MD  07/19/2022 1:07 PM    [place]

## 2022-07-19 NOTE — Assessment & Plan Note (Signed)
Volume status is stable.  She has done much better since she took the extra furosemide but has not needed it recently.  She appears euvolemic on exam.

## 2022-07-20 NOTE — Addendum Note (Signed)
Addended by: Vennie Homans on: 07/20/2022 08:17 AM   Modules accepted: Orders

## 2022-08-02 ENCOUNTER — Encounter (HOSPITAL_BASED_OUTPATIENT_CLINIC_OR_DEPARTMENT_OTHER): Payer: Self-pay | Admitting: Cardiovascular Disease

## 2022-08-02 ENCOUNTER — Telehealth (INDEPENDENT_AMBULATORY_CARE_PROVIDER_SITE_OTHER): Payer: Medicare Other | Admitting: Cardiovascular Disease

## 2022-08-02 DIAGNOSIS — I5033 Acute on chronic diastolic (congestive) heart failure: Secondary | ICD-10-CM | POA: Diagnosis not present

## 2022-08-02 DIAGNOSIS — I10 Essential (primary) hypertension: Secondary | ICD-10-CM | POA: Diagnosis not present

## 2022-08-02 DIAGNOSIS — I480 Paroxysmal atrial fibrillation: Secondary | ICD-10-CM | POA: Diagnosis not present

## 2022-08-02 MED ORDER — DOXAZOSIN MESYLATE 2 MG PO TABS: 2.0000 mg | ORAL_TABLET | Freq: Every day | ORAL | 3 refills | Status: DC

## 2022-08-02 NOTE — Assessment & Plan Note (Signed)
Pressure remains uncontrolled, though improved.  On average she is in the 150s to 160s at home.  She continues to take hydralazine as needed for blood pressures over 150.  However we will also add doxazosin 2 mg p.o. daily.  Options are limited by intolerances, chronic kidney disease, and bradycardia.  Continue hydralazine 50 mg 3 times daily.

## 2022-08-02 NOTE — Patient Instructions (Signed)
Medication Instructions:  START DOXAZOSIN 2 MG DAILY   *If you need a refill on your cardiac medications before your next appointment, please call your pharmacy*  Labwork: NONE  Testing/Procedures: NONE  Follow-Up: 09/06/2022 10:00 AM VIRTUAL VISIT WITH DR [facility]   If you need a refill on your cardiac medications before your next appointment, please call your pharmacy.

## 2022-08-02 NOTE — Assessment & Plan Note (Addendum)
Lately stable.  No recent episodes.  Continue amiodarone and Eliquis.  She is not on nodal agents 2/2 bradycardia.

## 2022-08-02 NOTE — Progress Notes (Signed)
Virtual Visit via Video Note   Because of Cyndi C Sanroman's co-morbid illnesses, she is at least at moderate risk for complications without adequate follow up.  This format is felt to be most appropriate for this patient at this time.  All issues noted in this document were discussed and addressed.  A limited physical exam was performed with this format.  Please refer to the patient's chart for her consent to telehealth for [facility].  The patient was identified using 2 identifiers.  Date:  08/02/2022   ID:  MARZETTA LANZA, DOB 1941-03-23, MRN [id number]  Patient Location: Home Provider Location: Office/Clinic  PCP:  [name], MD  Cardiologist:  [name], MD  Electrophysiologist:  Constance Haw, MD   Evaluation Performed:  Follow-Up Visit  Chief Complaint:  Hypertension  History of Present Illness:    LUCENDIA LEARD is a 81 y.o. female with a hx of persistent atrial fibrillation status post ablation, bradycardia, CKD 2, mitral regurgitation, aortic regurgitation, hypertension, and hyperlipidemia here for follow up. She had an echo 05/14/2019 that revealed LVEF 60 to 65% with grade 2 diastolic dysfunction and mildly elevated pulmonary pressures.  She was initially seen 12/2020 in the hypertension clinic.  She was first diagnosed with hypertension many decades ago.  In the past it was well-controlled.  However in the last year she has struggled.  Lately her blood pressure has been in the 160s at home.  She last saw [name] on 11/22/2019.  Prior to that losartan was increased to 50 mg.  Her blood pressure remained above goal since making that change.  [name] increased losartan to '100mg'$ .  She followed up with [name], NP, and hydralazine '25mg'$  bid was added on 2/26.  At her first visit hydralazine was increased.  She saw [name], NP on 12/2019 an dit was increased to '75mg'$ .  She started to work out at the [employer].  She was seen in the ED with  palpitations 03/2020.  She was in sinus rhythm at the time.  This had resolved.   It was noted that she had inadvertently stopped taking her hydrochlorothiazide. She this was resumed when she saw her pharmacist on 03/2020.  Hydralazine was reduced back to 50 mg twice daily.  HCTZ was changed to chlorthalidone. Her lipids were poorly controlled so she was started to rosuvastatin. 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 She reported palpitations but was noted to be in sinus rhythm at that time.  Her renal function was worsening so chlorthalidone was reduced and she was started on clonidine.  Renal artery dopplers were normal 08/2021.  In 08/2021 she was seen in the hospital.  She reported shortness of breath and chest pain.  She also had increasing lower extremity edema.  She was noted to be in atrial fibrillation with RVR in the 90s to 150s.  She was given IV diltiazem and IV metoprolol.  High-sensitivity troponin was minimally elevated 22 and flat.  It was thought to be demand ischemia in the setting of A. fib with RVR.  With the initiation of IV diltiazem her heart rate quickly improved.  She was started on metoprolol 12.5 mg twice daily.  Her home hydralazine was held and she was asked to follow-up closely with cardiology. When she followed up in clinic she was bradycardic.  Clonidine and metoprolol were both discontinued she was instructed to take the metoprolol only as needed.  She  was continued on losartan and chlorthalidone.  She followed up with EP and remained in sinus rhythm.  Given her chronic kidney disease she was not a candidate for dofetilide.  They discussed ablation versus amiodarone.  She was started on a 2-week monitor to better assess her heart rate and rhythm. She had 20 runs of SVT up to 17 beats. Her average heart rate was 53 bpm. She did have some overnight bradycardia to the mid 30s.   She was feeling better, with well controlled blood pressure and consistent heart rates in  the 40s. She saw [name] 10/12/21 and was doing well. It was noted she had continued episodes of Afib but had been in sinus rhythm for most of the interim. She was started on 200 mg amiodarone twice daily for 1 month followed by 200 mg daily. On 11/18/2021 she presented to the ED 1 week after a head injury due to persistent pain and raised lesion. She had accidentally struck her occiput on the underside of a cabinet. Workup revealed a small, non-bleeding occipital hematoma suitable for outpatient management.   She saw her PCP and chlorthalidone was discontinued 2/2 AKI.  After that she started having lower extremity edema and her blood pressures increased.  She was seen in the ED 02/14/2022.  She also had mild increased exertional dyspnea.  BNP was 244.  Cardiac enzymes were negative.  She was given Lasix 20 mg to be taken for 5 days.  She had not had any improvement in her edema.  Her home blood pressures ranged 110s to 150s over 60s to 70s. Hydralazine was increased.  She saw her PCP week and her blood pressure was 110/60.  Furosemide was reduced to 20 mg.  She followed up again 5/25 and continued to have nausea and decreased appetite.  She stopped the furosemide and felt better.  She was seen in the hospital 03/2022 with near syncope in the setting of intravascular volume depletion. Although she was bradycardic this was not thought to be the main cause. She had AKI that improved with IV fluids. She remained in sinus rhythm on amiodarone, though she did struggle with nausea. She followed up with [name] 06/2022 and was stable. She saw her PCP 07/06/22 and noted increased LE edema. They recommended that she take Lasix for 3 days. BNP was 323; creatinine was stable at 1.95.  She was seen in the office 2 weeks ago and her blood pressure was 184/68. She also complained of intermittent atrial fibrillation.Hydralazine was increased. BNP showed that kidney function was improving. Her HGB was stable. Today, she  states that her blood pressures have mostly been in the 150s-160s over 70s. Her BP has been higher in the morning than in the evenings. She has been compliant with her current medication regimen. Her heart rates have been between 44-61bpm. Overall she feels that she is doing well. She denies any further episodes of A-fib. She denies any complaints of dizziness, light-headedness, or palpitations. She reports having BLE swelling yesterday that has improved somewhat today with use of compression stockings. She states that she has not been propping up her legs recently.   Previous antihypertensives: Amlodipine- ankle swelling Spironolactone- unsure why it was stopped Hydralazine- swelling with '100mg'$ , OK with '50mg'$  Clonidine- worked well.  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 cath 2/09: no CAD, EF 70%   GERD (gastroesophageal reflux disease) 07/26/2021   HYPERLIPIDEMIA    HYPERTENSION    Hypertensive urgency 07/19/2022   Mild Aortic insufficiency    Moderate mitral regurgitation    Moderate tricuspid regurgitation    OSTEOPENIA    PAF (paroxysmal atrial fibrillation) ([place])    a. s/p ablation 2013; b. CHADS2VASc -> 4 (HTN, age x 2, female)-->Eliquis.   PAT (paroxysmal atrial tachycardia)    a. 04/2019 Zio: Occas PACs and rare PVCs. 21 atrial runs - longest 20 beats, max rate 169.   Pneumonia 2009   RA (rheumatoid arthritis) (HCC)    Sinus Bradycardia    a. asymptomatic but prevents use of AVN blocking agents; b. 04/2019 Zio: Avg HR 61 (37-109).   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 05/2019 Echo: EF 60-65%. DD. RVSP 41.89mHg. Mod dil LA. Mod MR/TR. Mild AI.    Past Surgical History:  Procedure Laterality Date   ABLATION OF DYSRHYTHMIC FOCUS     CARDIAC  CATHETERIZATION     COLONOSCOPY  04/18/08   Partial hysterectomy--1979     Thoracentesis   12/20/07      Current Medications: Current Meds  Medication Sig   acetaminophen (TYLENOL) 325 MG tablet Take 325 mg by mouth every 6 (six) hours as needed for mild pain.   amiodarone (PACERONE) 200 MG tablet TAKE 1/2 TABLET (100 MG TOTAL) BY MOUTH DAILY   apixaban (ELIQUIS) 2.5 MG TABS tablet Take 1 tablet (2.5 mg total) by mouth 2 (two) times daily.   D 1000 25 MCG (1000 UT) capsule TAKE 1 CAPSULE BY MOUTH AT BEDTIME.   doxazosin (CARDURA) 2 MG tablet Take 1 tablet (2 mg total) by mouth daily.   furosemide (LASIX) 40 MG tablet Take 0.5 tablets (20 mg total) by mouth as needed for fluid or edema.   hydrALAZINE (APRESOLINE) 50 MG tablet Take 1 tablet (50 mg total) by mouth every 8 (eight) hours as needed. FOR BLOOD PRESSURE ABOVE 150   pantoprazole (PROTONIX) 40 MG tablet TAKE 1 TABLET BY MOUTH ONCE DAILY   potassium chloride SA (KLOR-CON M) 20 MEQ tablet TAKE 1 TABLET BY MOUTH DAILY. TAKE WHEN TAKING FUROSEMIDE.   promethazine (PHENERGAN) 25 MG tablet Take 1 tablet (25 mg total) by mouth every 8 (eight) hours as needed for nausea or vomiting.     Allergies:   Amlodipine, Celecoxib, and Tramadol   Social History   Socioeconomic History   Marital status: Married    Spouse name: Not on file   Number of children: Not on file   Years of education: Not on file   Highest education level: Not on file  Occupational History   Not on file  Tobacco Use   Smoking status: Never   Smokeless tobacco: Never  Vaping Use   Vaping Use: Never used  Substance and Sexual Activity   Alcohol use: No    Alcohol/week: 0.0 standard drinks of alcohol   Drug use: No   Sexual activity: Not Currently    Birth control/protection: None  Other Topics Concern   Not on file  Social History Narrative   Marital Status: widow x 1 yrChildren: 5, grandchildren 118 numerous great grand 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 Full  Code ( reviewed 2015) Occasional exercise.Diet: fruits and veggies, lean meats.   Right handed    Caffeine- none    Social Determinants of Health   Financial Resource Strain: Low Risk  (04/11/2022)   Overall Financial Resource Strain (CARDIA)    Difficulty of Paying Living Expenses: Not hard at all  Food Insecurity: No Food Insecurity (04/11/2022)   Hunger Vital Sign    Worried About Running Out of Food in the Last Year: Never true    Ran Out of Food in the Last Year: Never true  Transportation Needs: No Transportation Needs (04/11/2022)   PRAPARE - Hydrologist (Medical): No    Lack of Transportation (Non-Medical): No  Physical Activity: Inactive (04/11/2022)   Exercise Vital Sign    Days of Exercise per Week: 0 days    Minutes of Exercise per Session: 0 min  Stress: No Stress Concern Present (04/11/2022)   [place]    Feeling of Stress : Not at all  Social Connections: Not on file     Family History: The patient's family history includes Atrial fibrillation in her brother; Breast cancer (age of onset: 66) in her paternal aunt; Diabetes in an other family member; Heart attack in her brother; Heart disease in her brother and mother; Pancreatic cancer in her father.  ROS:   Please see the history of present illness.  (+) BLE swelling   All other systems reviewed and are negative.  EKGs/Labs/Other Studies Reviewed:    EKG:    EKG is personally reviewed. 08/02/2022: EKG is not ordered today. 07/19/2022:  Sinus bradycardia. Rate 52 bpm. Nonspecific ST changes. 12/02/2021: Sinus bradycardia. Rate 49 bpm. 08/19/2021:  Ectopic atrial bradycardia.  Rate 47 bpm. 07/26/2021: Sinus rhythm Rate 65 03/23/2021: EKG was not ordered. 11/27/2020: Sinus rhythm.  Rate 60 bpm. 12/06/19: sinus bradycardia.  Rate 53 bpm.  Non-specific ST-T changes.   MRA Neck  03/22/2022: FINDINGS: Aortic arch: Visualized aortic arch normal caliber with standard branching pattern. No stenosis about the origin of the great vessels. Right carotid system: Right common and internal carotid arteries patent without stenosis or evidence for dissection. Left carotid system: Left common and internal carotid arteries patent without stenosis or evidence for dissection. Vertebral arteries: Both vertebral arteries arise from the subclavian arteries. No visible proximal subclavian artery stenosis. Vertebral arteries co dominant and patent without stenosis or dissection. Other: None. IMPRESSION: Normal MRA of the neck.  Echo 12/29/2021:  1. Left ventricular ejection fraction, by estimation, is 60 to 65%. The  left ventricle has normal function. The left ventricle has no regional  wall motion abnormalities. Left ventricular diastolic parameters are  indeterminate.   2. Right ventricular systolic function is normal. The right ventricular  size is normal. There is mildly elevated pulmonary artery systolic  pressure.   3. Left atrial size was severely dilated.   4. Right atrial size was severely dilated.   5. The mitral valve is degenerative. Moderate mitral valve regurgitation.   6. Tricuspid valve regurgitation is mild to moderate.   7. The aortic valve is grossly normal. Aortic valve regurgitation is mild  to moderate. Aortic valve sclerosis/calcification is present, without any  evidence of aortic stenosis.   8. The inferior vena cava is normal in size with greater than 50%  respiratory variability, suggesting right atrial pressure of 3 mmHg.  Comparison(s): EF 60%, MV thickening w/ moderate MR, mild AI, no  AS. 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 No significant changes from prior.   Monitor 09/2021: Patch Wear Time:  12 days and 16 hours Predominant underlying rhythm was sinus rhythm Less than 1% ventricular and supraventricular ectopy 20 SVT runs, longest 17 beats, fastest 6 beats at 176 bpm No triggered events noted  Bilateral Renal Artery Doppler 09/08/2021: Summary:  Largest Aortic Diameter: 2.1 cm  Renal:  Right: Abnormal size for the right kidney. Normal cortical thickness         of right kidney. No evidence of right renal artery stenosis.         RRV flow present.  Left: Cyst(s) noted. LRV flow present. No evidence of left renal         artery stenosis. Abnormal cortical thickness of the left         kidney. Abnormal size for the left kidney.  Mesenteric: Normal Celiac artery , Superior Mesenteric artery and Inferior Mesenteric artery findings.   Echo 05/14/19:  1. The left ventricle has normal systolic function with an ejection  fraction of 60-65%. The cavity size was normal. There is mildly increased  left ventricular wall thickness. Left ventricular diastolic Doppler  parameters are consistent with  pseudonormalization.   2. The right ventricle has normal systolic function. The cavity was  normal. There is no increase in right ventricular wall thickness. Right  ventricular systolic pressure is mildly elevated with an estimated  pressure of 41.7 mmHg.   3. Left atrial size was moderately dilated.   4. Incompletely evaluated hypoechoic structure noted in the liver, most  likely a cyst.   5. The mitral valve is degenerative. Mild thickening of the mitral valve  leaflet. Mitral valve regurgitation is moderate by color flow Doppler.   6. Tricuspid valve regurgitation is moderate.   7. The aortic valve has an indeterminate number of cusps. Mild thickening  of the aortic valve. Mild calcification of the aortic valve. Aortic  valve  regurgitation is mild by color flow Doppler.   8. The aorta is normal in size and structure.   Recent Labs: 03/22/2022: B Natriuretic Peptide 102.4 03/23/2022: TSH 0.645 03/24/2022: Magnesium 2.4 07/06/2022: ALT 13; Pro B Natriuretic peptide (BNP) 323.0 07/19/2022: BUN 27; Creatinine, Ser 1.80; Hemoglobin 10.5; Platelets 159; Potassium 4.1; Sodium 140   Recent Lipid Panel    Component Value Date/Time   CHOL 205 (H) 08/18/2021 0325   TRIG 47 08/18/2021 0325   HDL 78 08/18/2021 0325   CHOLHDL 2.6 08/18/2021 0325   VLDL 9 08/18/2021 0325   LDLCALC 118 (H) 08/18/2021 0325   LDLDIRECT 104.4 11/26/2012 0746    Physical Exam:    VS:  Ht '5\' 6"'$  (1.676 m)   BMI 22.53 kg/m  , BMI Body mass index is 22.53 kg/m. GENERAL:  Well appearing HEENT: Pupils equal round and reactive, fundi not visualized, oral mucosa unremarkable NECK:  No jugular venous distention, waveform within normal limits, carotid upstroke brisk and symmetric, no bruits, no thyromegaly LUNGS:  Clear to auscultation bilaterally HEART:  RRR.  PMI not displaced or sustained,S1 and S2 within normal limits, no S3, no S4, no clicks, no rubs, no murmurs ABD:  Flat, positive bowel sounds normal in frequency in pitch, no bruits, no rebound, no guarding, no midline pulsatile mass, no hepatomegaly, no splenomegaly EXT:  2 plus pulses throughout, no edema, no cyanosis no clubbing SKIN:  No rashes no nodules NEURO:  Cranial nerves II through XII grossly intact, motor  grossly intact throughout [facility]:  Cognitively intact, oriented to person place and time  ASSESSMENT:    1. PAF (paroxysmal atrial fibrillation) ([place])   2. Essential hypertension   3. Acute on chronic diastolic CHF (congestive heart failure) (HCC)      PLAN:    PAF (paroxysmal atrial fibrillation) (HCC) Lately stable.  No recent episodes.  Continue amiodarone and Eliquis.  She is not on nodal agents 2/2 bradycardia.   Essential hypertension Pressure remains  uncontrolled, though improved.  On average she is in the 150s to 160s at home.  She continues to take hydralazine as needed for blood pressures over 150.  However we will also add doxazosin 2 mg p.o. daily.  Options are limited by intolerances, chronic kidney disease, and bradycardia.  Continue hydralazine 50 mg 3 times daily.  Acute on chronic diastolic CHF (congestive heart failure) ([place]) She continues to have some mild edema.  Renal function is improving and she is minimally bothered by it.  Continue with TED hose and furosemide 20 mg p.o. daily as needed.  Blood pressure management as above.   Time:   Today, I have spent 17 minutes with the patient with telehealth technology discussing the above problems.    Disposition:  F/u with Braiden Presutti C. Oval Linsey, MD, [facility] or APP in one month  Medication Adjustments/Labs and Tests Ordered: Current medicines are reviewed at length with the patient today.  Concerns regarding medicines are outlined above.   No orders of the defined types were placed in this encounter.  Meds ordered this encounter  Medications   doxazosin (CARDURA) 2 MG tablet    Sig: Take 1 tablet (2 mg total) by mouth daily.    Dispense:  90 tablet    Refill:  3    I,Alexis Herring,acting as a scribe for [name], MD.,have documented all relevant documentation on the behalf of [name], MD,as directed by  [name], MD while in the presence of [name], MD.  I, [place] Oval Linsey, MD have reviewed all documentation for this visit.  The documentation of the exam, diagnosis, procedures, and orders on 08/02/2022 are all accurate and complete.  Signed, [name], MD  08/02/2022 11:31 AM    [place]

## 2022-08-02 NOTE — Assessment & Plan Note (Signed)
She continues to have some mild edema.  Renal function is improving and she is minimally bothered by it.  Continue with TED hose and furosemide 20 mg p.o. daily as needed.  Blood pressure management as above.

## 2022-08-04 ENCOUNTER — Ambulatory Visit (INDEPENDENT_AMBULATORY_CARE_PROVIDER_SITE_OTHER): Payer: Medicare Other

## 2022-08-04 DIAGNOSIS — Z23 Encounter for immunization: Secondary | ICD-10-CM | POA: Diagnosis not present

## 2022-09-06 ENCOUNTER — Ambulatory Visit (HOSPITAL_BASED_OUTPATIENT_CLINIC_OR_DEPARTMENT_OTHER): Payer: Medicare Other | Admitting: Cardiovascular Disease

## 2022-09-19 ENCOUNTER — Ambulatory Visit: Payer: Medicare Other | Admitting: Psychiatry

## 2022-09-19 ENCOUNTER — Encounter: Payer: Self-pay | Admitting: Psychiatry

## 2022-09-19 NOTE — Progress Notes (Deleted)
   CC:  post-concussive syndrome  Follow-up Visit  Last visit: 03/21/22  Brief HPI: 81 year old female with a history of aortic valve stenosis, CAD, HTN, pAfib on eliquis, HLD who follows in clinic for post-concussive syndrome after hitting her head on a sink in February 2023.  At her last visit, brain [name] was ordered. Referral to ST was placed. She was not interested in starting headache medications at that time.  Interval History: Since her last visit***  Brain [name] 03/21/22 showed moderately advanced chronic microvascular ischemic changes and was otherwise unremarkable. MRA neck was normal. Headache days per month: *** Migraine days per month*** Headache free days per month: ***  Current Headache Regimen: Preventative: *** Abortive: ***   Prior Therapies                                  Metoprolol 12.5 mg  Losartan 100 mg daily Zofran Phenergan  Physical Exam:   Vital Signs: There were no vitals taken for this visit. GENERAL:  well appearing, in no acute distress, alert  SKIN:  Color, texture, turgor normal. No rashes or lesions HEAD:  Normocephalic/atraumatic. RESP: normal respiratory effort MSK:  No gross joint deformities.   NEUROLOGICAL: Mental Status: Alert, oriented to person, place and time, Follows commands, and Speech fluent and appropriate. Cranial Nerves: PERRL, face symmetric, no dysarthria, hearing grossly intact Motor: moves all extremities equally Gait: normal-based.  IMPRESSION: ***  PLAN: ***   Follow-up: ***  I spent a total of *** minutes on the date of the service. Headache education was done. Discussed lifestyle modification including increased oral hydration, decreased caffeine, exercise and stress management. Discussed treatment options including preventive and acute medications, natural supplements, and infusion therapy. Discussed medication overuse headache and to limit use of acute treatments to no more than 2 days/week or 10  days/month. Discussed medication side effects, adverse reactions and drug interactions. Written educational materials and patient instructions outlining all of the above were given.  Genia Harold, MD

## 2022-10-19 ENCOUNTER — Inpatient Hospital Stay: Payer: Medicare Other | Admitting: Oncology

## 2022-10-19 ENCOUNTER — Inpatient Hospital Stay: Payer: Medicare Other | Attending: Oncology

## 2022-10-19 ENCOUNTER — Other Ambulatory Visit: Payer: Self-pay

## 2022-10-19 ENCOUNTER — Encounter: Payer: Self-pay | Admitting: Oncology

## 2022-10-19 VITALS — BP 163/66 | HR 58 | Temp 96.1°F | Resp 16 | Wt 140.0 lb

## 2022-10-19 DIAGNOSIS — D509 Iron deficiency anemia, unspecified: Secondary | ICD-10-CM

## 2022-10-19 DIAGNOSIS — Z90710 Acquired absence of both cervix and uterus: Secondary | ICD-10-CM | POA: Insufficient documentation

## 2022-10-19 DIAGNOSIS — D631 Anemia in chronic kidney disease: Secondary | ICD-10-CM

## 2022-10-19 DIAGNOSIS — N1832 Chronic kidney disease, stage 3b: Secondary | ICD-10-CM

## 2022-10-19 LAB — COMPREHENSIVE METABOLIC PANEL
ALT: 13 U/L (ref 0–44)
AST: 20 U/L (ref 15–41)
Albumin: 4 g/dL (ref 3.5–5.0)
Alkaline Phosphatase: 53 U/L (ref 38–126)
Anion gap: 6 (ref 5–15)
BUN: 28 mg/dL — ABNORMAL HIGH (ref 8–23)
CO2: 25 mmol/L (ref 22–32)
Calcium: 9 mg/dL (ref 8.9–10.3)
Chloride: 107 mmol/L (ref 98–111)
Creatinine, Ser: 1.97 mg/dL — ABNORMAL HIGH (ref 0.44–1.00)
GFR, Estimated: 25 mL/min — ABNORMAL LOW (ref >60)
Glucose, Bld: 73 mg/dL (ref 70–99)
Potassium: 3.8 mmol/L (ref 3.5–5.1)
Sodium: 138 mmol/L (ref 135–145)
Total Bilirubin: 0.8 mg/dL (ref 0.3–1.2)
Total Protein: 7.3 g/dL (ref 6.5–8.1)

## 2022-10-19 LAB — IRON AND TIBC
Iron: 83 ug/dL (ref 28–170)
Saturation Ratios: 24 % (ref 10.4–31.8)
TIBC: 342 ug/dL (ref 250–450)
UIBC: 259 ug/dL

## 2022-10-19 LAB — CBC WITH DIFFERENTIAL/PLATELET
Abs Immature Granulocytes: 0.01 10*3/uL (ref 0.00–0.07)
Basophils Absolute: 0.1 10*3/uL (ref 0.0–0.1)
Basophils Relative: 1 %
Eosinophils Absolute: 0.1 10*3/uL (ref 0.0–0.5)
Eosinophils Relative: 2 %
HCT: 30.9 % — ABNORMAL LOW (ref 36.0–46.0)
Hemoglobin: 10.3 g/dL — ABNORMAL LOW (ref 12.0–15.0)
Immature Granulocytes: 0 %
Lymphocytes Relative: 36 %
Lymphs Abs: 1.6 10*3/uL (ref 0.7–4.0)
MCH: 30.7 pg (ref 26.0–34.0)
MCHC: 33.3 g/dL (ref 30.0–36.0)
MCV: 92.2 fL (ref 80.0–100.0)
Monocytes Absolute: 0.3 10*3/uL (ref 0.1–1.0)
Monocytes Relative: 7 %
Neutro Abs: 2.4 10*3/uL (ref 1.7–7.7)
Neutrophils Relative %: 54 %
Platelets: 160 10*3/uL (ref 150–400)
RBC: 3.35 MIL/uL — ABNORMAL LOW (ref 3.87–5.11)
RDW: 13.6 % (ref 11.5–15.5)
WBC: 4.5 10*3/uL (ref 4.0–10.5)
nRBC: 0 % (ref 0.0–0.2)

## 2022-10-19 LAB — FERRITIN: Ferritin: 38 ng/mL (ref 11–307)

## 2022-10-20 ENCOUNTER — Encounter: Payer: Self-pay | Admitting: Oncology

## 2022-10-20 NOTE — Progress Notes (Signed)
Hematology/Oncology Consult note [facility]  [phone] [phone]  Patient Care Team: [name], MD as PCP - General [name], MD as PCP - Cardiology (Cardiology) Constance Haw, MD as PCP - Electrophysiology (Cardiology) Birder Robson, MD as Referring Physician (Ophthalmology) Clent Jacks, RN as Oncology Nurse Navigator   Name of the patient: [name]  [id number]  03-12-1941   Date of visit: 10/20/22  Diagnosis-anemia of chronic kidney disease  Chief complaint/ Reason for visit-routine follow-up of anemia  Heme/Onc history: patient is a 82 year old female with a past medical history significant for atrial fibrillation, paroxysmal supraventricular tachycardia, hypertension, coronary artery disease referred for anemia.  Her most recent CBC with differential from 11/11/2020 showed a white count of 5.4, H&H of 10.6/32.1 with an MCV of 92 and a platelet count of 183.  Looking back at her prior CBCs patient has always had a normal white count and a platelet count and her hemoglobin has typically remained between 10-11 since 2009.  Patient recently had iron studies, TSH and B12 checked on 11/11/2020 which was within normal limits.     Patient does have chronic kidney disease but has not required any EPO so far  Interval history-patient has ongoing fatigue.  She reports trouble sleeping  ECOG PS- 1 Pain scale- 0   Review of systems- Review of Systems  Constitutional:  Positive for malaise/fatigue. Negative for chills, fever and weight loss.  HENT:  Negative for congestion, ear discharge and nosebleeds.   Eyes:  Negative for blurred vision.  Respiratory:  Negative for cough, hemoptysis, sputum production, shortness of breath and wheezing.   Cardiovascular:  Negative for chest pain, palpitations, orthopnea and claudication.  Gastrointestinal:  Negative for abdominal pain, blood in stool, constipation,  diarrhea, heartburn, melena, nausea and vomiting.  Genitourinary:  Negative for dysuria, flank pain, frequency, hematuria and urgency.  Musculoskeletal:  Negative for back pain, joint pain and myalgias.  Skin:  Negative for rash.  Neurological:  Negative for dizziness, tingling, focal weakness, seizures, weakness and headaches.  Endo/Heme/Allergies:  Does not bruise/bleed easily.  Psychiatric/Behavioral:  Negative for depression and suicidal ideas. The patient has insomnia.       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 cath 2/09: no CAD, EF 70%   GERD (gastroesophageal reflux disease) 07/26/2021   HYPERLIPIDEMIA    HYPERTENSION    Hypertensive urgency 07/19/2022   Mild Aortic insufficiency    Moderate mitral regurgitation    Moderate tricuspid regurgitation    OSTEOPENIA    PAF (paroxysmal atrial fibrillation) ([place])    a. s/p ablation 2013; b. CHADS2VASc -> 4 (HTN, age x 2, female)-->Eliquis.   PAT (paroxysmal atrial tachycardia)    a. 04/2019 Zio: Occas PACs and rare PVCs. 21 atrial runs - longest 20 beats, max rate 169.   Pneumonia 2009   RA (rheumatoid arthritis) (HCC)    Sinus Bradycardia    a. asymptomatic but prevents use of AVN blocking agents; b. 04/2019 Zio: Avg HR 61 (37-109).   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 05/2019 Echo: EF 60-65%. DD. RVSP 41.69mHg. Mod dil LA. Mod MR/TR. Mild AI.     Past Surgical History:  Procedure Laterality Date   ABLATION OF DYSRHYTHMIC FOCUS     CARDIAC CATHETERIZATION     COLONOSCOPY  04/18/08   Partial hysterectomy--1979      Thoracentesis   12/20/07      Social History   Socioeconomic History   Marital status: Married    Spouse name: Not on file   Number of children: Not on file   Years of education: Not on file   Highest education level: Not on file  Occupational History   Not on file  Tobacco Use   Smoking status: Never   Smokeless tobacco: Never  Vaping Use   Vaping Use: Never used  Substance and Sexual Activity   Alcohol use: No    Alcohol/week: 0.0 standard drinks of alcohol   Drug use: No   Sexual activity: Not Currently    Birth control/protection: None  Other Topics Concern   Not on file  Social History Narrative   Marital Status: widow x 1 yrChildren: 5, grandchildren 161 numerous great grand 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 Full Code ( reviewed 2015) Occasional exercise.Diet: fruits and veggies, lean meats.   Right handed    Caffeine- none    Social Determinants of Health   Financial Resource Strain: Low Risk  (04/11/2022)   Overall Financial Resource Strain (CARDIA)    Difficulty of Paying Living Expenses: Not hard at all  Food Insecurity: No Food Insecurity (04/11/2022)   Hunger Vital Sign    Worried About Running Out of Food in the Last Year: Never true    Ran Out of Food in the Last Year: Never true  Transportation Needs: No Transportation Needs (04/11/2022)   PRAPARE - THydrologist(Medical): No    Lack of Transportation (Non-Medical): No  Physical Activity: Inactive (04/11/2022)   Exercise Vital Sign    Days of Exercise per Week: 0 days    Minutes of Exercise per Session: 0 min  Stress: No Stress Concern Present (04/11/2022)   [place]   Feeling of Stress : Not at all  Social Connections: Not on file  Intimate Partner Violence: Not on file    Family History  Problem Relation Age of Onset   Diabetes Other    Breast cancer Paternal Aunt [phone]  Heart disease Mother    Pancreatic cancer Father    Atrial fibrillation Brother    Heart disease Brother    Heart attack Brother      Current Outpatient Medications:    acetaminophen (TYLENOL) 325 MG tablet, Take 325 mg by mouth every 6 (six) hours as needed for mild pain., Disp: , Rfl:    amiodarone (PACERONE) 200 MG tablet, TAKE 1/2 TABLET (100 MG TOTAL) BY MOUTH DAILY, Disp: 45 tablet, Rfl: 3   apixaban (ELIQUIS) 2.5 MG TABS tablet, Take 1 tablet (2.5 mg total) by mouth 2 (two) times daily., Disp: 180 tablet, Rfl: 3   D 1000 25 MCG (1000 UT) capsule, TAKE 1 CAPSULE BY MOUTH AT BEDTIME., Disp: 30 capsule, Rfl: 3   doxazosin (CARDURA) 2 MG tablet, Take 1 tablet (2 mg total) by mouth daily., Disp: 90 tablet, Rfl:  3   furosemide (LASIX) 40 MG tablet, Take 0.5 tablets (20 mg total) by mouth as needed for fluid or edema., Disp: 30 tablet, Rfl: 3   hydrALAZINE (APRESOLINE) 50 MG tablet, Take 1 tablet (50 mg total) by mouth every 8 (eight) hours as needed. FOR BLOOD PRESSURE ABOVE 150, Disp: 30 tablet, Rfl: 0   LUMIGAN 0.01 % SOLN, SMARTSIG:In Eye(s), Disp: , Rfl:    pantoprazole (PROTONIX) 40 MG tablet, TAKE 1 TABLET BY MOUTH ONCE DAILY, Disp: 30 tablet, Rfl: 11   potassium chloride SA (KLOR-CON M) 20 MEQ tablet, TAKE 1 TABLET BY MOUTH DAILY. TAKE WHEN TAKING FUROSEMIDE., Disp: 30 tablet, Rfl: 3   promethazine (PHENERGAN) 25 MG tablet, Take 1 tablet (25 mg total) by mouth every 8 (eight) hours as needed for nausea or vomiting., Disp: 20 tablet, Rfl: 0   rosuvastatin (CRESTOR) 10 MG tablet, Take 1 tablet (10 mg total) by mouth daily., Disp: 90 tablet, Rfl: 3  Physical exam:  Vitals:   10/19/22 1120  BP: (!) 163/66  Pulse: (!) 58  Resp: 16  Temp: (!) 96.1 F  (35.6 C)  TempSrc: Tympanic  SpO2: 100%  Weight: 140 lb (63.5 kg)   Physical Exam Constitutional:      General: She is not in acute distress. Cardiovascular:     Rate and Rhythm: Normal rate and regular rhythm.     Heart sounds: Normal heart sounds.  Pulmonary:     Effort: Pulmonary effort is normal.     Breath sounds: Normal breath sounds.  Abdominal:     General: Bowel sounds are normal.     Palpations: Abdomen is soft.  Skin:    General: Skin is warm and dry.  Neurological:     Mental Status: She is alert and oriented to person, place, and time.         Latest Ref Rng & Units 10/19/2022   11:12 AM  CMP  Glucose 70 - 99 mg/dL 73   BUN 8 - 23 mg/dL 28   Creatinine 0.44 - 1.00 mg/dL 1.97   Sodium 135 - 145 mmol/L 138   Potassium 3.5 - 5.1 mmol/L 3.8   Chloride 98 - 111 mmol/L 107   CO2 22 - 32 mmol/L 25   Calcium 8.9 - 10.3 mg/dL 9.0   Total Protein 6.5 - 8.1 g/dL 7.3   Total Bilirubin 0.3 - 1.2 mg/dL 0.8   Alkaline Phos 38 - 126 U/L 53   AST 15 - 41 U/L 20   ALT 0 - 44 U/L 13       Latest Ref Rng & Units 10/19/2022   10:50 AM  CBC  WBC 4.0 - 10.5 K/uL 4.5   Hemoglobin 12.0 - 15.0 g/dL 10.3   Hematocrit 36.0 - 46.0 % 30.9   Platelets 150 - 400 K/uL 160     Assessment and plan- Patient is a 82 y.o. female here for routine follow-up of anemia of chronic kidney disease  Patient's hemoglobin has remained stable around 10-11 for the last couple of years.  She has not required any EPO so far for her anemia.  Her hemoglobin was 11 even back in 2021.  Iron studies did come back after the patient left for the day and ferritin levels are low at 38.  In patients with chronic kidney disease it would be desirable to keep ferritin close 200.  We will reach out to her if she is interested in receiving IV iron.  Given the stability  of her hemoglobin I will repeat her CBC ferritin and iron studies and BMP in 6 months in 1 year and see her back in 1 year   Visit Diagnosis 1.  Iron deficiency anemia, unspecified iron deficiency anemia type   2. Anemia of chronic renal failure, stage 3b (HCC)      Dr. Randa Evens, MD, MPH [facility] at [facility] [phone] 10/20/2022 11:24 AM

## 2022-10-26 ENCOUNTER — Ambulatory Visit (INDEPENDENT_AMBULATORY_CARE_PROVIDER_SITE_OTHER)
Admission: RE | Admit: 2022-10-26 | Discharge: 2022-10-26 | Disposition: A | Discharge status: Home / Self Care | Payer: Medicare Other | Source: Ambulatory Visit | Attending: Family Medicine | Admitting: Family Medicine

## 2022-10-26 ENCOUNTER — Encounter: Payer: Self-pay | Admitting: Family Medicine

## 2022-10-26 ENCOUNTER — Encounter: Payer: Self-pay | Admitting: *Deleted

## 2022-10-26 ENCOUNTER — Ambulatory Visit (INDEPENDENT_AMBULATORY_CARE_PROVIDER_SITE_OTHER): Payer: Medicare Other | Admitting: Family Medicine

## 2022-10-26 ENCOUNTER — Ambulatory Visit: Payer: Medicare Other | Admitting: Family Medicine

## 2022-10-26 VITALS — BP 140/62 | HR 61 | Temp 98.5°F | Resp 16 | Ht 66.0 in | Wt 139.0 lb

## 2022-10-26 DIAGNOSIS — M25552 Pain in left hip: Secondary | ICD-10-CM | POA: Diagnosis not present

## 2022-10-26 DIAGNOSIS — N9489 Other specified conditions associated with female genital organs and menstrual cycle: Secondary | ICD-10-CM

## 2022-10-26 DIAGNOSIS — K5900 Constipation, unspecified: Secondary | ICD-10-CM

## 2022-10-26 DIAGNOSIS — R110 Nausea: Secondary | ICD-10-CM

## 2022-10-26 DIAGNOSIS — R1909 Other intra-abdominal and pelvic swelling, mass and lump: Secondary | ICD-10-CM | POA: Diagnosis not present

## 2022-10-26 DIAGNOSIS — R413 Other amnesia: Secondary | ICD-10-CM | POA: Diagnosis not present

## 2022-10-26 MED ORDER — ONDANSETRON HCL 4 MG PO TABS: 4.0000 mg | ORAL_TABLET | Freq: Three times a day (TID) | ORAL | 0 refills | Status: DC | PRN

## 2022-10-26 NOTE — Assessment & Plan Note (Signed)
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  Also possible left trochanteric bursitis.  Will evaluate with x-rays today.  Not clearly referred from adnexa but given she is almost due for reevaluation of adnexal mass we will proceed with repeat ultrasound and CA125.

## 2022-10-26 NOTE — Patient Instructions (Addendum)
Increase water and fiber.  Start Miralax daily 17 gm  for constipation.  We will set  up [place].   Please stop at the lab to have labs drawn and X-ray.. we will call you with results.  Start  tylenol ES 2 capsules at bedtime.   We can consider Aricept in the future for memory  but as this can cause nausea I would not start now. Can try Prevagen OTC now.  Can try zofran for nausea instead of promethazine.

## 2022-10-26 NOTE — Assessment & Plan Note (Signed)
Chronic, progressive Concerning for possible dementia.  At next office visit consider Mini-Mental status exam and lab workup.  Discussed Prevagen versus Aricept.  He would definitely hold off on Aricept at this point given her chronic nausea.

## 2022-10-26 NOTE — Assessment & Plan Note (Signed)
Chronic, minimal p.o. intake and minimal water intake.  Encouraged her to increase water and fiber in her diet.  She will start MiraLAX 17 g daily

## 2022-10-26 NOTE — Assessment & Plan Note (Addendum)
Will repeat US given increase in pain, with repeat Ca125. Of note pain today is in hip and not left adnexa.

## 2022-10-26 NOTE — Progress Notes (Signed)
Patient ID: [name], female    DOB: 11/04/1940, 82 y.o.   MRN: [id number]  This visit was conducted in person.  BP (!) 140/62   Pulse 61   Temp 98.5 F (36.9 C)   Resp 16   Ht '5\' 6"'$  (1.676 m)   Wt 139 lb (63 kg)   SpO2 98%   BMI 22.44 kg/m    CC:  Chief Complaint  Patient presents with   Hip Pain    Dull pain X 1 week on left lower side.     Subjective:   HPI: [name] is a 82 y.o. female presenting on 10/26/2022 for Hip Pain (Dull pain X 1 week on left lower side. )  She reports  chronic onset pain  in left  in last 6 months worsened in last week .Marland Kitchen Pain in lateral in hip not really  abdominal. BMs occurring 3-4 times a week.  Some increase in urination, no dysuria, no blood in urine.   Pain in left lateral hip, dull, achy.. wakes her up at night. Trouble sleeping.  Worse at night, no change with walking.  History of constipation, atrial fibrillation Left adnexal mass followed.. due for repeat US and CA125 per GYN ONC 6 mo to 1 year ( 1 year =05/2023)   Still having some nausea. Wt Readings from Last 3 Encounters:  10/26/22 139 lb (63 kg)  10/19/22 140 lb (63.5 kg)  07/19/22 139 lb 9.6 oz (63.3 kg)       Reviewed heme note from 1/10/23Dr. Janese Banks for anemia of chronic renal failure.. plans to start iron infusion Relevant past medical, surgical, family and social history reviewed and updated as indicated. Interim medical history since our last visit reviewed. Allergies and medications reviewed and updated. Outpatient Medications Prior to Visit  Medication Sig Dispense Refill   acetaminophen (TYLENOL) 325 MG tablet Take 325 mg by mouth every 6 (six) hours as needed for mild pain.     amiodarone (PACERONE) 200 MG tablet TAKE 1/2 TABLET (100 MG TOTAL) BY MOUTH DAILY 45 tablet 3   apixaban (ELIQUIS) 2.5 MG TABS tablet Take 1 tablet (2.5 mg total) by mouth 2 (two) times daily. 180 tablet 3   D 1000 25 MCG (1000 UT) capsule TAKE 1 CAPSULE BY MOUTH AT  BEDTIME. 30 capsule 3   doxazosin (CARDURA) 2 MG tablet Take 1 tablet (2 mg total) by mouth daily. 90 tablet 3   furosemide (LASIX) 40 MG tablet Take 0.5 tablets (20 mg total) by mouth as needed for fluid or edema. 30 tablet 3   hydrALAZINE (APRESOLINE) 50 MG tablet Take 1 tablet (50 mg total) by mouth every 8 (eight) hours as needed. FOR BLOOD PRESSURE ABOVE 150 30 tablet 0   LUMIGAN 0.01 % SOLN SMARTSIG:In Eye(s)     pantoprazole (PROTONIX) 40 MG tablet TAKE 1 TABLET BY MOUTH ONCE DAILY 30 tablet 11   potassium chloride SA (KLOR-CON M) 20 MEQ tablet TAKE 1 TABLET BY MOUTH DAILY. TAKE WHEN TAKING FUROSEMIDE. 30 tablet 3   rosuvastatin (CRESTOR) 10 MG tablet Take 1 tablet (10 mg total) by mouth daily. 90 tablet 3   promethazine (PHENERGAN) 25 MG tablet Take 1 tablet (25 mg total) by mouth every 8 (eight) hours as needed for nausea or vomiting. 20 tablet 0   No facility-administered medications prior to visit.     Per HPI unless specifically indicated in ROS section below Review of Systems  Psychiatric/Behavioral:  Positive for  suicidal ideas.    Objective:  BP (!) 140/62   Pulse 61   Temp 98.5 F (36.9 C)   Resp 16   Ht '5\' 6"'$  (1.676 m)   Wt 139 lb (63 kg)   SpO2 98%   BMI 22.44 kg/m   Wt Readings from Last 3 Encounters:  10/26/22 139 lb (63 kg)  10/19/22 140 lb (63.5 kg)  07/19/22 139 lb 9.6 oz (63.3 kg)      Physical Exam Constitutional:      General: She is not in acute distress.    Appearance: Normal appearance. She is well-developed. She is not ill-appearing or toxic-appearing.  HENT:     Head: Normocephalic.     Right Ear: Hearing, tympanic membrane, ear canal and external ear normal. Tympanic membrane is not erythematous, retracted or bulging.     Left Ear: Hearing, tympanic membrane, ear canal and external ear normal. Tympanic membrane is not erythematous, retracted or bulging.     Nose: No mucosal edema or rhinorrhea.     Right Sinus: No maxillary sinus tenderness  or frontal sinus tenderness.     Left Sinus: No maxillary sinus tenderness or frontal sinus tenderness.     Mouth/Throat:     Pharynx: Uvula midline.  Eyes:     General: Lids are normal. Lids are everted, no foreign bodies appreciated.     Conjunctiva/sclera: Conjunctivae normal.     Pupils: Pupils are equal, round, and reactive to light.  Neck:     Thyroid: No thyroid mass or thyromegaly.     Vascular: No carotid bruit.     Trachea: Trachea normal.  Cardiovascular:     Rate and Rhythm: Normal rate and regular rhythm.     Pulses: Normal pulses.     Heart sounds: Normal heart sounds, S1 normal and S2 normal. No murmur heard.    No friction rub. No gallop.  Pulmonary:     Effort: Pulmonary effort is normal. No tachypnea or respiratory distress.     Breath sounds: Normal breath sounds. No decreased breath sounds, wheezing, rhonchi or rales.  Abdominal:     General: Bowel sounds are normal.     Palpations: Abdomen is soft.     Tenderness: There is no abdominal tenderness.  Musculoskeletal:     Cervical back: Normal range of motion and neck supple.     Comments: Tender to palpation in left anterior hip joint, mildly tender to palpation over lateral hip at the trochanteric bursa, decreased range of motion of left hip with external and internal rotation.  Pain with external rotation.  Skin:    General: Skin is warm and dry.     Findings: No rash.  Neurological:     Mental Status: She is alert.  Psychiatric:        Mood and Affect: Mood is not anxious or depressed.        Speech: Speech normal.        Behavior: Behavior normal. Behavior is cooperative.        Thought Content: Thought content normal.        Judgment: Judgment normal.       Results for orders placed or performed in visit on 10/19/22  Comprehensive metabolic panel  Result Value Ref Range   Sodium 138 135 - 145 mmol/L   Potassium 3.8 3.5 - 5.1 mmol/L   Chloride 107 98 - 111 mmol/L   CO2 25 22 - 32 mmol/L    Glucose, Bld 73 70 -  99 mg/dL   BUN 28 (H) 8 - 23 mg/dL   Creatinine, Ser 1.97 (H) 0.44 - 1.00 mg/dL   Calcium 9.0 8.9 - 10.3 mg/dL   Total Protein 7.3 6.5 - 8.1 g/dL   Albumin 4.0 3.5 - 5.0 g/dL   AST 20 15 - 41 U/L   ALT 13 0 - 44 U/L   Alkaline Phosphatase 53 38 - 126 U/L   Total Bilirubin 0.8 0.3 - 1.2 mg/dL   GFR, Estimated 25 (L) >60 mL/min   Anion gap 6 5 - 15    Assessment and Plan  Adnexal mass Assessment & Plan:  Will repeat US given increase in pain, with repeat Ca125. Of note pain today is in hip and not left adnexa.  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  Also possible left trochanteric bursitis.  Will evaluate with x-rays today.  Not clearly referred from adnexa but given she is almost due for reevaluation of adnexal mass we will proceed with repeat ultrasound and CA125.  Orders: -     DG HIP UNILAT W OR W/O PELVIS 2-3 VIEWS LEFT; Future  Memory loss Assessment & Plan: Chronic, progressive Concerning for possible dementia.  At next office visit consider Mini-Mental status exam and lab workup.  Discussed Prevagen versus Aricept.  He would definitely hold off on Aricept at this point given her chronic nausea.   Chronic nausea  Constipation, unspecified constipation type Assessment & Plan: Chronic, minimal p.o. intake and minimal water intake.  Encouraged her to increase water and fiber in her diet.  She will start MiraLAX 17 g daily   Other orders -     Ondansetron HCl; Take 1-2 tablets (4-8 mg total) by mouth every 8 (eight) hours as needed for nausea or vomiting.  Dispense: 30 tablet; Refill: 0    Return in about 4 weeks (around 11/23/2022) for follow up multiple issues including hip pain, memory and  chronic nausea.Eliezer Lofts, MD

## 2022-10-27 LAB — CA 125: CA 125: 19 U/mL (ref <35)

## 2022-10-31 ENCOUNTER — Other Ambulatory Visit: Payer: Self-pay | Admitting: Oncology

## 2022-10-31 ENCOUNTER — Ambulatory Visit
Admission: RE | Admit: 2022-10-31 | Discharge: 2022-10-31 | Disposition: A | Discharge status: Home / Self Care | Payer: Medicare Other | Source: Ambulatory Visit | Attending: Family Medicine | Admitting: Family Medicine

## 2022-10-31 DIAGNOSIS — R1900 Intra-abdominal and pelvic swelling, mass and lump, unspecified site: Secondary | ICD-10-CM | POA: Diagnosis not present

## 2022-10-31 DIAGNOSIS — N9489 Other specified conditions associated with female genital organs and menstrual cycle: Secondary | ICD-10-CM | POA: Insufficient documentation

## 2022-11-02 ENCOUNTER — Inpatient Hospital Stay: Payer: Medicare Other

## 2022-11-02 VITALS — BP 120/53 | HR 50 | Temp 97.1°F | Resp 16

## 2022-11-02 DIAGNOSIS — D631 Anemia in chronic kidney disease: Secondary | ICD-10-CM | POA: Diagnosis not present

## 2022-11-02 DIAGNOSIS — N1832 Chronic kidney disease, stage 3b: Secondary | ICD-10-CM | POA: Diagnosis not present

## 2022-11-02 DIAGNOSIS — D509 Iron deficiency anemia, unspecified: Secondary | ICD-10-CM | POA: Diagnosis not present

## 2022-11-02 MED ORDER — SODIUM CHLORIDE 0.9 % IV SOLN
Freq: Once | INTRAVENOUS | Status: AC
  Filled 2022-11-02: qty 250

## 2022-11-02 MED ORDER — SODIUM CHLORIDE 0.9 % IV SOLN
510.0000 mg | INTRAVENOUS | Status: DC
  Administered 2022-11-02: 510 mg via INTRAVENOUS
  Filled 2022-11-02: qty 17

## 2022-11-02 NOTE — Patient Instructions (Signed)

## 2022-11-03 ENCOUNTER — Telehealth: Payer: Self-pay

## 2022-11-03 DIAGNOSIS — N2581 Secondary hyperparathyroidism of renal origin: Secondary | ICD-10-CM | POA: Diagnosis not present

## 2022-11-03 DIAGNOSIS — E785 Hyperlipidemia, unspecified: Secondary | ICD-10-CM | POA: Diagnosis not present

## 2022-11-03 DIAGNOSIS — N184 Chronic kidney disease, stage 4 (severe): Secondary | ICD-10-CM | POA: Diagnosis not present

## 2022-11-03 DIAGNOSIS — I129 Hypertensive chronic kidney disease with stage 1 through stage 4 chronic kidney disease, or unspecified chronic kidney disease: Secondary | ICD-10-CM | POA: Diagnosis not present

## 2022-11-03 DIAGNOSIS — D631 Anemia in chronic kidney disease: Secondary | ICD-10-CM | POA: Diagnosis not present

## 2022-11-03 LAB — MICROALBUMIN / CREATININE URINE RATIO: Microalb Creat Ratio: 59

## 2022-11-03 LAB — PROTEIN / CREATININE RATIO, URINE
Albumin, U: 69.7
Creatinine, Urine: 117.7

## 2022-11-03 NOTE — Telephone Encounter (Signed)
[name] spoke to her gyn and [name] sent message to Steffanie Dunn to put the pt on for wed appts

## 2022-11-03 NOTE — Telephone Encounter (Signed)
Can you take care of this from gyn onc standpoint? I see her for IDA

## 2022-11-03 NOTE — Telephone Encounter (Signed)
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

## 2022-11-09 ENCOUNTER — Inpatient Hospital Stay: Payer: Medicare Other

## 2022-11-11 ENCOUNTER — Inpatient Hospital Stay: Payer: Medicare Other | Attending: Oncology

## 2022-11-11 VITALS — BP 149/63 | HR 48 | Temp 97.0°F | Resp 16

## 2022-11-11 DIAGNOSIS — D631 Anemia in chronic kidney disease: Secondary | ICD-10-CM

## 2022-11-11 DIAGNOSIS — Z90710 Acquired absence of both cervix and uterus: Secondary | ICD-10-CM | POA: Insufficient documentation

## 2022-11-11 DIAGNOSIS — N1832 Chronic kidney disease, stage 3b: Secondary | ICD-10-CM | POA: Diagnosis not present

## 2022-11-11 DIAGNOSIS — D509 Iron deficiency anemia, unspecified: Secondary | ICD-10-CM | POA: Diagnosis not present

## 2022-11-11 MED ORDER — SODIUM CHLORIDE 0.9 % IV SOLN
510.0000 mg | INTRAVENOUS | Status: DC
  Administered 2022-11-11: 510 mg via INTRAVENOUS
  Filled 2022-11-11: qty 510

## 2022-11-11 MED ORDER — SODIUM CHLORIDE 0.9 % IV SOLN
Freq: Once | INTRAVENOUS | Status: AC
  Filled 2022-11-11: qty 250

## 2022-11-11 NOTE — Patient Instructions (Signed)

## 2022-11-16 ENCOUNTER — Ambulatory Visit: Payer: Medicare Other

## 2022-11-16 ENCOUNTER — Encounter: Payer: Self-pay | Admitting: Family Medicine

## 2022-11-16 ENCOUNTER — Encounter: Payer: Self-pay | Admitting: Nurse Practitioner

## 2022-11-16 NOTE — Telephone Encounter (Signed)
On 11/08/22 [name] was called and appointment was arranged for 11/16/2022. She cancelled this appointment on 11/10/2022.

## 2022-11-16 NOTE — Progress Notes (Signed)
Received message from Dr. Diona Browner regarding changes to patient's pelvic u/s. 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 I reviewed changes with Dr Fransisca Connors who offered exam and follow up with gyn onc. Patient was offered appointment, scheduled, but cancelled. She can call cancer center for appointment at her convenience.

## 2022-11-17 ENCOUNTER — Encounter (HOSPITAL_COMMUNITY): Payer: Self-pay | Admitting: *Deleted

## 2022-11-28 DIAGNOSIS — H2512 Age-related nuclear cataract, left eye: Secondary | ICD-10-CM | POA: Diagnosis not present

## 2022-11-28 DIAGNOSIS — H35341 Macular cyst, hole, or pseudohole, right eye: Secondary | ICD-10-CM | POA: Diagnosis not present

## 2022-11-28 DIAGNOSIS — H401131 Primary open-angle glaucoma, bilateral, mild stage: Secondary | ICD-10-CM | POA: Diagnosis not present

## 2022-12-08 ENCOUNTER — Encounter: Payer: Self-pay | Admitting: Family Medicine

## 2022-12-08 ENCOUNTER — Ambulatory Visit (INDEPENDENT_AMBULATORY_CARE_PROVIDER_SITE_OTHER): Payer: Medicare Other | Admitting: Family Medicine

## 2022-12-08 VITALS — BP 180/76 | HR 52 | Temp 98.0°F | Ht 66.0 in | Wt 143.0 lb

## 2022-12-08 DIAGNOSIS — G301 Alzheimer's disease with late onset: Secondary | ICD-10-CM

## 2022-12-08 DIAGNOSIS — F02B0 Dementia in other diseases classified elsewhere, moderate, without behavioral disturbance, psychotic disturbance, mood disturbance, and anxiety: Secondary | ICD-10-CM

## 2022-12-08 DIAGNOSIS — R351 Nocturia: Secondary | ICD-10-CM | POA: Diagnosis not present

## 2022-12-08 DIAGNOSIS — M25552 Pain in left hip: Secondary | ICD-10-CM

## 2022-12-08 DIAGNOSIS — N9489 Other specified conditions associated with female genital organs and menstrual cycle: Secondary | ICD-10-CM

## 2022-12-08 MED ORDER — DONEPEZIL HCL 5 MG PO TABS: 5.0000 mg | ORAL_TABLET | Freq: Every day | ORAL | 11 refills | Status: DC

## 2022-12-08 NOTE — Assessment & Plan Note (Signed)
Chronic, intermittent Now resolved completely with treatment of chronic constipation.  She will continue using MiraLAX 17 g p.o. daily along with increased water intake and exercise.  Of note plain films showed no evidence of osteoarthritis in left hip.

## 2022-12-08 NOTE — Assessment & Plan Note (Addendum)
MMSE 19/30. Start aricept daily. 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 Evaluate with labs to rule out secondary causes of memory loss including TSH, vitamin deficiencies.

## 2022-12-08 NOTE — Patient Instructions (Addendum)
Call to reschedule follow up with Beckey Rutter NP    At [facility] at [facility]. Address: [address], [place], [place] [address]  Phone: [phone]  Drink water earlier in the day.. only sips after dinner.  Start Aricept 5 mg daily for memory issues.  Please stop at the lab to have labs drawn.

## 2022-12-08 NOTE — Progress Notes (Signed)
Patient ID: [name], female    DOB: 26-Feb-1941, 82 y.o.   MRN: [id number]  This visit was conducted in person.  BP (!) 180/76   Pulse (!) 52   Temp 98 F (36.7 C) (Temporal)   Ht '5\' 6"'$  (1.676 m)   Wt 143 lb (64.9 kg)   SpO2 98%   BMI 23.08 kg/m    CC:  Chief Complaint  Patient presents with   Medical Management of Chronic Issues    Follow up Multiple Issues    Subjective:   HPI: [name] is a 82 y.o. female presenting on 12/08/2022 for Medical Management of Chronic Issues (Follow up Multiple Issues)  Anemia followed by Dr. Janese Banks hematology, reviewed last office visit note from October 19, 2002.  Treated with iron infusions  At last office visit with me on October 26, 2022 she was having continued left hip pain  Plain film of pelvis: No clear fracture noted significant constipation and no hip arthritis. 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  Contacted [facility] and they were minimally concerned but recommended follow-up. Ca1 25 normal  Chronic constipation recommended MiraLAX 17 g daily along with nausea medicine as needed.  Today she reports she is feeling better.  She is having daily BM  , softer, no straining. Sometime twice daily.  She reports left hip, pelvic symptoms improved after regular BMs.   Appears she cancelled the appt that was made for her with Beckey Rutter to follow up with Gyn Onc on adnexal mass.     Some increased frequency and nocturia.. but drinkign lots of water before bed.  No dysuria   Some improvement in memory issues since being on prevagen.  Husband says memory is about the same, maybe slightly better.  Relevant past medical, surgical, family and social history reviewed and updated as indicated. Interim medical history since our last visit reviewed. Allergies and medications reviewed and updated. Outpatient Medications Prior to  Visit  Medication Sig Dispense Refill   acetaminophen (TYLENOL) 325 MG tablet Take 325 mg by mouth every 6 (six) hours as needed for mild pain.     amiodarone (PACERONE) 200 MG tablet TAKE 1/2 TABLET (100 MG TOTAL) BY MOUTH DAILY 45 tablet 3   apixaban (ELIQUIS) 2.5 MG TABS tablet Take 1 tablet (2.5 mg total) by mouth 2 (two) times daily. 180 tablet 3   D 1000 25 MCG (1000 UT) capsule TAKE 1 CAPSULE BY MOUTH AT BEDTIME. 30 capsule 3   doxazosin (CARDURA) 2 MG tablet Take 1 tablet (2 mg total) by mouth daily. 90 tablet 3   furosemide (LASIX) 40 MG tablet Take 0.5 tablets (20 mg total) by mouth as needed for fluid or edema. 30 tablet 3   hydrALAZINE (APRESOLINE) 50 MG tablet Take 1 tablet (50 mg total) by mouth every 8 (eight) hours as needed. FOR BLOOD PRESSURE ABOVE 150 30 tablet 0   LUMIGAN 0.01 % SOLN SMARTSIG:In Eye(s)     ondansetron (ZOFRAN) 4 MG tablet Take 1-2 tablets (4-8 mg total) by mouth every 8 (eight) hours as needed for nausea or vomiting. 30 tablet 0   pantoprazole (PROTONIX) 40 MG tablet TAKE 1 TABLET BY MOUTH ONCE DAILY 30 tablet 11   potassium chloride SA (KLOR-CON M) 20 MEQ tablet TAKE 1 TABLET BY MOUTH DAILY. TAKE WHEN TAKING FUROSEMIDE. 30 tablet 3   rosuvastatin (CRESTOR) 10 MG  tablet Take 1 tablet (10 mg total) by mouth daily. 90 tablet 3   No facility-administered medications prior to visit.     Per HPI unless specifically indicated in ROS section below Review of Systems  Constitutional:  Negative for fatigue and fever.  HENT:  Negative for congestion.   Eyes:  Negative for pain.  Respiratory:  Negative for cough and shortness of breath.   Cardiovascular:  Negative for chest pain, palpitations and leg swelling.  Gastrointestinal:  Negative for abdominal pain.  Genitourinary:  Negative for dysuria and vaginal bleeding.  Musculoskeletal:  Negative for back pain.  Neurological:  Negative for syncope, light-headedness and headaches.  Psychiatric/Behavioral:  Negative  for behavioral problems and dysphoric mood.    Objective:  BP (!) 180/76   Pulse (!) 52   Temp 98 F (36.7 C) (Temporal)   Ht '5\' 6"'$  (1.676 m)   Wt 143 lb (64.9 kg)   SpO2 98%   BMI 23.08 kg/m   Wt Readings from Last 3 Encounters:  12/08/22 143 lb (64.9 kg)  10/26/22 139 lb (63 kg)  10/19/22 140 lb (63.5 kg)      Wt Readings from Last 3 Encounters:  12/08/22 143 lb (64.9 kg)  10/26/22 139 lb (63 kg)  10/19/22 140 lb (63.5 kg)    Physical Exam Constitutional:      General: She is not in acute distress.    Appearance: Normal appearance. She is well-developed. She is not ill-appearing or toxic-appearing.  HENT:     Head: Normocephalic.     Right Ear: Hearing, tympanic membrane, ear canal and external ear normal. Tympanic membrane is not erythematous, retracted or bulging.     Left Ear: Hearing, tympanic membrane, ear canal and external ear normal. Tympanic membrane is not erythematous, retracted or bulging.     Nose: No mucosal edema or rhinorrhea.     Right Sinus: No maxillary sinus tenderness or frontal sinus tenderness.     Left Sinus: No maxillary sinus tenderness or frontal sinus tenderness.     Mouth/Throat:     Pharynx: Uvula midline.  Eyes:     General: Lids are normal. Lids are everted, no foreign bodies appreciated.     Conjunctiva/sclera: Conjunctivae normal.     Pupils: Pupils are equal, round, and reactive to light.  Neck:     Thyroid: No thyroid mass or thyromegaly.     Vascular: No carotid bruit.     Trachea: Trachea normal.  Cardiovascular:     Rate and Rhythm: Normal rate and regular rhythm.     Pulses: Normal pulses.     Heart sounds: Normal heart sounds, S1 normal and S2 normal. No murmur heard.    No friction rub. No gallop.  Pulmonary:     Effort: Pulmonary effort is normal. No tachypnea or respiratory distress.     Breath sounds: Normal breath sounds. No decreased breath sounds, wheezing, rhonchi or rales.  Abdominal:     General: Bowel sounds  are normal.     Palpations: Abdomen is soft.     Tenderness: There is no abdominal tenderness.  Musculoskeletal:     Cervical back: Normal range of motion and neck supple.  Skin:    General: Skin is warm and dry.     Findings: No rash.  Neurological:     Mental Status: She is alert.  Psychiatric:        Mood and Affect: Mood is not anxious or depressed.  Speech: Speech normal.        Behavior: Behavior normal. Behavior is cooperative.        Thought Content: Thought content normal.        Judgment: Judgment normal.     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 Start aricept daily. 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 Evaluate with labs to rule out secondary causes of memory loss including TSH, vitamin deficiencies.  Orders: -     VITAMIN D 25 Hydroxy (Vit-D Deficiency, Fractures) -     Vitamin B12 -     TSH  Adnexal mass Assessment & Plan: C8 125 negative but given some interval change in the pelvic ultrasound, recommended remaking appointment that was canceled with Amada Kingfisher.  Information on contact provided to patient.   Nocturia Assessment & Plan: Chronic, no evidence of diabetes on lab.  Symptoms are most consistent with overactive bladder.  Patient also has behavioral changes she can make such as drinking more earlier in the day.  If symptoms or not improving as expected we can consider a course of Myrbetriq.  I would need to avoid anticholinergic medication to treat.  No signs and symptoms of bacterial  urinary tract infection.   Left hip pain Assessment & Plan: Chronic, intermittent Now resolved completely with treatment of chronic constipation.  She will continue using MiraLAX 17 g p.o. daily along with increased water intake and exercise.  Of note plain films showed no evidence of osteoarthritis in left hip.   Other orders -     Donepezil HCl; Take 1 tablet (5 mg total) by mouth at bedtime.  Dispense: 30 tablet; Refill: 11    Return in about 3 months (around 03/08/2023) for follow up dementia, MMSE.   Eliezer Lofts, MD

## 2022-12-08 NOTE — Assessment & Plan Note (Signed)
Chronic, no evidence of diabetes on lab.  Symptoms are most consistent with overactive bladder.  Patient also has behavioral changes she can make such as drinking more earlier in the day.  If symptoms or not improving as expected we can consider a course of Myrbetriq.  I would need to avoid anticholinergic medication to treat.  No signs and symptoms of bacterial urinary tract infection.

## 2022-12-08 NOTE — Assessment & Plan Note (Signed)
C8 125 negative but given some interval change in the pelvic ultrasound, recommended remaking appointment that was canceled with Amada Kingfisher.  Information on contact provided to patient.

## 2022-12-09 LAB — VITAMIN D 25 HYDROXY (VIT D DEFICIENCY, FRACTURES): VITD: 25.77 ng/mL — ABNORMAL LOW (ref 30.00–100.00)

## 2022-12-09 LAB — VITAMIN B12: Vitamin B-12: 710 pg/mL (ref 211–911)

## 2022-12-09 LAB — TSH: TSH: 1.65 u[IU]/mL (ref 0.35–5.50)

## 2022-12-13 ENCOUNTER — Telehealth: Payer: Self-pay | Admitting: Family Medicine

## 2022-12-13 NOTE — Telephone Encounter (Signed)
No new referral palced. Pt was already seen by GYN ONC.. had appt with  Beckey Rutter, but unfortunately cancelled the appt... just needs to reschedule!  See note from 11/16/2022 from Beckey Rutter in [facility].

## 2022-12-13 NOTE — Telephone Encounter (Signed)
I don't see that a referral was made to the [facility].  Please advise.

## 2022-12-13 NOTE — Telephone Encounter (Signed)
Pt called stating she was instructed by [facility] to contact the [facility] to schedule an appt. Pt states she was told she couldn't schedule any appts but wasn't given an explanation as to why & was instructed to contact our office back for further help. Call back # [phone]

## 2022-12-14 DIAGNOSIS — H401131 Primary open-angle glaucoma, bilateral, mild stage: Secondary | ICD-10-CM | POA: Diagnosis not present

## 2022-12-14 DIAGNOSIS — H2512 Age-related nuclear cataract, left eye: Secondary | ICD-10-CM | POA: Diagnosis not present

## 2022-12-28 ENCOUNTER — Inpatient Hospital Stay: Payer: Medicare Other | Attending: Oncology | Admitting: Obstetrics and Gynecology

## 2022-12-28 VITALS — BP 183/68 | HR 53 | Temp 97.8°F | Resp 20 | Wt 147.7 lb

## 2022-12-28 DIAGNOSIS — N9489 Other specified conditions associated with female genital organs and menstrual cycle: Secondary | ICD-10-CM

## 2022-12-28 DIAGNOSIS — N83202 Unspecified ovarian cyst, left side: Secondary | ICD-10-CM | POA: Insufficient documentation

## 2022-12-28 DIAGNOSIS — Z90710 Acquired absence of both cervix and uterus: Secondary | ICD-10-CM | POA: Insufficient documentation

## 2022-12-28 NOTE — Progress Notes (Signed)
[facility]necologic Oncology Consult Visit   Referring Provider: PCP Dr Diona Browner  Chief Concern: incidentally discovered left adnexal mass  Subjective:  [name] is a 82 y.o. female who is seen in consultation from Dr. Diona Browner for follow up for incidental adnexal mass, low ROMA score. She has been followed conservatively with imaging.   She continues to feel at baseline.  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 Rarely has LLQ abdominal pain.  [place] 10/20/22 US pelvis FINDINGS: Measurements: Prior hysterectomy. No abnormality of the vaginal cuff. Right ovary Measurements: Not visualized Left ovary Measurements: Not visualized Other findings   Within the LEFT adnexal region, there is a tubular structure measuring 2.1 x 5.1 x 2.1 centimeters./on today's exam, there are at least 2 mural nodules within this mass, measuring 0.7 x 0.5 x 0.8 centimeters and 0.4 x 0.4 x 0.7 centimeters. Doppler evaluation shows no internal blood flow in the structures. There is layering debris within this mass. There is no free pelvic fluid.   IMPRESSION: 1. Persistent tubular fluid-filled mass within the LEFT adnexal region containing layering debris. On today's exam, there are 2 discrete mural nodules in this mass, warranting further evaluation. Recommend further characterization with [name] of the pelvis with intravenous contrast. 2. Prior hysterectomy. 3. Nonvisualized ovaries.  Gynecologic History:  Presented to pcp for hip pain. Imaging was performed 7/22 which showed incidental adnexal mass.   CKD with anemia. Bradycardia  04/28/21 [name]  IMPRESSION: 1. No acute osseous findings.  Minimal left hip osteoarthritis. 2. Tendinosis with low-grade insertional tearing of the bilateral gluteus minimus tendons. 3. Mild bilateral gluteus medius tendinosis without tear. 4. Mild tendinosis of the bilateral hamstring tendon origins. 5. Left adnexal cyst measuring up to 3.9 cm with possible small nodule along its  superior aspect. Pelvic ultrasound is recommended for further characterization.  Pelvic US 06/09/21 Post hysterectomy with nonvisualization of normal appearing ovaries.5.2 x 1.7 x 1.8 cm diameter complex cystic nodule in LEFT adnexa question of ovarian or paraovarian origin, complicated by scattered internal echoes and a 5 mm mural nodule which lacks internal blood flow on color Doppler imaging.   US Pelvic complete 11/04/2021 FINDINGS: Uterus: Surgically absent Endometrium: Surgically absent Right ovary: Not visualized, likely obscured by bowel Left ovary: No normal appearing LEFT ovary visualized, see below Other findings: Complex cyst LEFT adnexa 4.6 x 1.6 x 1.9 cm (previously 5.2 x 1.7 x 1.8 cm), containing a dependent debris fluid level. Single small mural nodule within the cystic lesion, 5 x 5 x 3 mm, measured 5 x 5 x 5 mm on previous exam. No septations, wall thickening, or additional mural nodules. No free pelvic fluid or additional pelvic masses. IMPRESSION: - Surgical absence of uterus with nonvisualization of RIGHT ovary. - Minimally complicated cyst of the LEFT adnexa 4.6 cm greatest size slightly decreased from the 5.2 cm on the previous exam, containing a dependent fluid level consistent with old hemorrhage or debris. - The small nodular focus within the lesion is little changed, remaining 5 mm greatest diameter. - No other pelvic sonographic abnormalities.  05/10/22- US Pelvic Complete with Transvaginal Uterus: surgically absent Endometrium: surgically absent.  Right ovary: surgically absent Left Ovary: no normal appearing left ovary visualized Other: No free pelvic fluid. Oblong cystic structure identified in LEFT adnexa 4.5 x 2.0 x 1.5 cm, containing mobile debris. This is littlechanged in size since the 5.2 x 1.7 x 1.8 cm on 06/09/2021. Debris fluid level again noted. The echogenic nodule seen in 2022 is now  seen within the debris fluid level, proximally 4 mm in size, mobile. No  additional pelvic masses. Impression:  - Post hysterectomy with nonvisualization of ovaries - Stable appearance of complicated cystic lesion in the LEFT adnexa containing mobile debris and a small mobile nodule; this could represent a cyst or potentially a segment of hydrosalpinx. - Continued follow-up imaging recommended in 6-12 months to demonstrate continued stability  Problem List: Patient Active Problem List   Diagnosis Date Noted   Nocturia 12/08/2022   Hypertensive urgency 07/19/2022   Constipation 05/10/2022   Acute kidney injury superimposed on chronic kidney disease ([place]) 03/22/2022   Chronic nausea 03/03/2022   Carotid artery calcification, bilateral 12/21/2021   Osteoarthritis of cervical spine 12/21/2021   Acute on chronic diastolic CHF (congestive heart failure) ([place]) 08/17/2021   Adnexal mass 08/04/2021   Anemia of chronic renal failure, stage 3b ([place]) 08/03/2021   GERD (gastroesophageal reflux disease) 07/26/2021   Left hip pain 04/20/2021   Moderate dementia ([place]) 11/10/2020   Sinus bradycardia 07/18/2019   Valvular heart disease 07/18/2019   PSVT (paroxysmal supraventricular tachycardia) 05/16/2019   Persistent atrial fibrillation ([place]) 07/19/2018   Peripheral edema 02/02/2018   Acute nonintractable headache 02/02/2018   Coronary artery disease, non-occlusive 09/14/2017   PAF (paroxysmal atrial fibrillation) ([place]) 09/14/2017   Status post ablation of atrial fibrillation 09/14/2017   Essential hypertension 09/14/2017   Tinea pedis 07/14/2017   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/15/2016   History of rheumatoid arthritis 03/14/2016   Mitral regurgitation 12/30/2015   Obesity 12/02/2015   Aortic valve insufficiency 12/02/2015   Vitamin D deficiency 04/17/2015   Counseling regarding end of life decision making 04/17/2015   Allergic rhinitis 02/03/2015   Aortic valve sclerosis 08/19/2010   Osteopenia 05/11/2010   SINUS BRADYCARDIA 08/19/2009   Anemia of  chronic disease 12/13/2007   HYPERLIPIDEMIA 08/23/2007   UNSPECIFIED GLAUCOMA 10/10/2006    Past Medical History: Past Medical History:  Diagnosis Date   Anemia    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 cath 2/09: no CAD, EF 70%   GERD (gastroesophageal reflux disease) 07/26/2021   HYPERLIPIDEMIA    HYPERTENSION    Hypertensive urgency 07/19/2022   Mild Aortic insufficiency    Moderate mitral regurgitation    Moderate tricuspid regurgitation    OSTEOPENIA    PAF (paroxysmal atrial fibrillation) ([place])    a. s/p ablation 2013; b. CHADS2VASc -> 4 (HTN, age x 2, female)-->Eliquis.   PAT (paroxysmal atrial tachycardia)    a. 04/2019 Zio: Occas PACs and rare PVCs. 21 atrial runs - longest 20 beats, max rate 169.   Pneumonia 2009   RA (rheumatoid arthritis) ([facility])    Sinus Bradycardia    a. asymptomatic but prevents use of AVN blocking agents; b. 04/2019 Zio: Avg HR 61 (37-109).   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 05/2019 Echo: EF 60-65%. DD. RVSP 41.48mmHg. Mod dil LA. Mod MR/TR. Mild AI.    Past Surgical History: Past Surgical History:  Procedure Laterality Date   ABLATION OF DYSRHYTHMIC FOCUS     CARDIAC CATHETERIZATION     COLONOSCOPY  04/18/08   Partial hysterectomy--1979     Thoracentesis   12/20/07    .  Family History: Family History  Problem Relation Age of Onset   Diabetes Other    Breast cancer Paternal Aunt 11  Heart disease Mother    Pancreatic cancer Father    Atrial fibrillation Brother    Heart disease Brother    Heart attack Brother     Social History: Social History   Socioeconomic History   Marital status: Married    Spouse name: Not on file   Number of children: Not on file   Years of education: Not on file   Highest education level: Not on file  Occupational History   Not on file  Tobacco Use   Smoking status: Never    Smokeless tobacco: Never  Vaping Use   Vaping Use: Never used  Substance and Sexual Activity   Alcohol use: No    Alcohol/week: 0.0 standard drinks of alcohol   Drug use: No   Sexual activity: Not Currently    Birth control/protection: None  Other Topics Concern   Not on file  Social History Narrative   Marital Status: widow x 1 yrChildren: 88, grandchildren 76, numerous great grand 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 Full Code ( reviewed 2015) Occasional exercise.Diet: fruits and veggies, lean meats.   Right handed    Caffeine- none    Social Determinants of Health   Financial Resource Strain: Low Risk  (04/11/2022)   Overall Financial Resource Strain (CARDIA)    Difficulty of Paying Living Expenses: Not hard at all  Food Insecurity: No Food Insecurity (04/11/2022)   Hunger Vital Sign    Worried About Running Out of Food in the Last Year: Never true    Ran Out of Food in the Last Year: Never true  Transportation Needs: No Transportation Needs (04/11/2022)   PRAPARE - Hydrologist (Medical): No    Lack of Transportation (Non-Medical): No  Physical Activity: Inactive (04/11/2022)   Exercise Vital Sign    Days of Exercise per Week: 0 days    Minutes of Exercise per Session: 0 min  Stress: No Stress Concern Present (04/11/2022)   [place]    Feeling of Stress : Not at all  Social Connections: Not on file  Intimate Partner Violence: Not on file    Allergies: Allergies  Allergen Reactions   Amlodipine     Ankle swelling   Celecoxib Other (See Comments) and Rash    Tachycardia/palpitations Other reaction(s):  Other Tachycardia/palpitations Tachycardia/palpitations   Tramadol Nausea Only    Current Medications: Current Outpatient Medications  Medication Sig Dispense Refill   acetaminophen (TYLENOL) 325 MG tablet Take 325 mg by mouth every 6 (six) hours as needed for mild pain.     amiodarone (PACERONE) 200 MG tablet TAKE 1/2 TABLET (100 MG TOTAL) BY MOUTH DAILY 45 tablet 3   apixaban (ELIQUIS) 2.5 MG TABS tablet Take 1 tablet (2.5 mg total) by mouth 2 (two) times daily. 180 tablet 3   D 1000 25 MCG (1000 UT) capsule TAKE 1 CAPSULE BY MOUTH AT BEDTIME. 30 capsule 3   donepezil (ARICEPT) 5 MG tablet Take 1 tablet (5 mg total) by mouth at bedtime. 30 tablet 11   doxazosin (CARDURA) 2 MG tablet Take 1 tablet (2 mg total) by mouth daily. 90 tablet 3   furosemide (LASIX) 40 MG tablet Take 0.5 tablets (20 mg total) by mouth as needed for fluid or edema. 30 tablet 3   hydrALAZINE (  APRESOLINE) 50 MG tablet Take 1 tablet (50 mg total) by mouth every 8 (eight) hours as needed. FOR BLOOD PRESSURE ABOVE 150 30 tablet 0   LUMIGAN 0.01 % SOLN SMARTSIG:In Eye(s)     ondansetron (ZOFRAN) 4 MG tablet Take 1-2 tablets (4-8 mg total) by mouth every 8 (eight) hours as needed for nausea or vomiting. 30 tablet 0   pantoprazole (PROTONIX) 40 MG tablet TAKE 1 TABLET BY MOUTH ONCE DAILY 30 tablet 11   potassium chloride SA (KLOR-CON M) 20 MEQ tablet TAKE 1 TABLET BY MOUTH DAILY. TAKE WHEN TAKING FUROSEMIDE. 30 tablet 3   rosuvastatin (CRESTOR) 10 MG tablet Take 1 tablet (10 mg total) by mouth daily. 90 tablet 3   No current facility-administered medications for this visit.    Review of Systems General:  fatigue Skin: no complaints Eyes: no complaints HEENT: no complaints Breasts: no complaints Pulmonary: no complaints Cardiac: no complaints Gastrointestinal: no complaints Genitourinary/Sexual: no complaints Ob/Gyn: no complaints Musculoskeletal: no complaints Hematology: no complaints Neurologic/Psych: no  complaints   Objective:  Physical Examination:  BP (!) 183/68   Pulse (!) 53   Temp 97.8 F (36.6 C)   Resp 20   Wt 147 lb 11.2 oz (67 kg)   SpO2 100%   BMI 23.84 kg/m    ECOG Performance Status: 1 - Symptomatic but completely ambulatory  GENERAL: Patient is a well appearing female in no acute distress HEENT:  Sclera clear. Anicteric NODES:  Negative axillary, supraclavicular, inguinal lymph node survery LUNGS:  Clear to auscultation bilaterally.   HEART:  Irregularly irregular ABDOMEN:  Soft, nontender. No masses or ascites EXTREMITIES:  No peripheral edema. Atraumatic. No cyanosis SKIN:  Clear with no obvious rashes or skin changes.  NEURO:  Nonfocal. Well oriented.  Appropriate affect.  Pelvic: EGBUS/Vagina: atrophic, Bimanual/RV: Hard stool.  No masses or tenderness.  Lab Review: Labs on site today:  No labs on site today  Radiologic Imaging: Per hpi    Chemistry      Component Value Date/Time   NA 138 10/19/2022 1112   NA 140 07/19/2022 1137   NA 142 08/19/2012 0208   K 3.8 10/19/2022 1112   K 4.2 08/19/2012 0208   CL 107 10/19/2022 1112   CL 109 (H) 08/19/2012 0208   CO2 25 10/19/2022 1112   CO2 24 08/19/2012 0208   BUN 28 (H) 10/19/2022 1112   BUN 27 07/19/2022 1137   BUN 27 (H) 08/19/2012 0208   CREATININE 1.97 (H) 10/19/2022 1112   CREATININE 1.34 (H) 02/02/2018 1622      Component Value Date/Time   CALCIUM 9.0 10/19/2022 1112   CALCIUM 9.0 08/19/2012 0208   ALKPHOS 53 10/19/2022 1112   ALKPHOS 73 08/19/2012 0208   AST 20 10/19/2022 1112   AST 25 08/19/2012 0208   ALT 13 10/19/2022 1112   ALT 19 08/19/2012 0208   BILITOT 0.8 10/19/2022 1112   BILITOT 0.4 08/19/2012 0208     Lab Results  Component Value Date   WBC 4.5 10/19/2022   HGB 10.3 (L) 10/19/2022   HCT 30.9 (L) 10/19/2022   MCV 92.2 10/19/2022   PLT 160 10/19/2022   CA125 10/26/22 = 19  CA125 10/22 = 16   Assessment:  [name] is a 82 y.o. female diagnosed with  incidentally detected complex 5.9 cm left adnexal mass with small mural nodule on [name] for hip pain 7/22.  Low suspicion for cancer. Normal ca 125. Follow up ultrasounds were stable  to smaller. Mural nodule was stable appearing. Clinically, asymptomatic.  CA125 remains normal.  Prior hysterectomy.   Medical co-morbidities complicating care: CKD/anemia, bradycardia, a fib w/ hx of ablation on eliquis Plan:   Problem List Items Addressed This Visit       Other   Adnexal mass - Primary   Spoke to Dr. Earle Gell Radiologist. He thinks there are now two small nodules in the cyst on the 1/24 [place] compared to one nodule on past scans.  In view of this, will order pelvic [name] for better characterization. If there is a significant level of concern can consider Laparoscopic resection to rule out malignancy.  That said, the mass has not really grown significantly over the past year and a half.      The patient's diagnosis, an outline of the further diagnostic and laboratory studies which will be required, the recommendation, and alternatives were discussed.  All questions were answered to the patient's satisfaction.  I discussed the assessment and treatment plan with the patient. The patient was provided an opportunity to ask questions and all were answered. The patient agreed with the plan and demonstrated an understanding of the instructions.   The patient was advised to call back or seek an in-person evaluation if the symptoms worsen or if the condition fails to improve as anticipated.   Mellody Drown, MD  CC:  [name], MD [address] [place],  [place] [id number] [phone]

## 2023-01-09 ENCOUNTER — Telehealth: Payer: Self-pay

## 2023-01-09 NOTE — Patient Outreach (Signed)
  Care Coordination   Initial Visit Note   01/09/2023 Name: [name] MRN: [id number] DOB: Sep 24, 1941  [name] is a 82 y.o. year old female who sees [name], MD for primary care. I spoke with  [name] by phone today.  What matters to the patients health and wellness today?  Patient denied having any nursing or community resource needs.     Goals Addressed             This Visit's Progress    care coordination activities - no further follow up needed       Interventions Today    Flowsheet Row Most Recent Value  General Interventions   General Interventions Discussed/Reviewed General Interventions Discussed  [Care coordination program/ service discussed, social determinants of health survey completed. AWV discussed and patient advised to contact provider office to schedule. Advised to contact [facility] provider office if care coordination services needed]              SDOH assessments and interventions completed:  Yes  SDOH Interventions Today    Flowsheet Row Most Recent Value  SDOH Interventions   Food Insecurity Interventions Intervention Not Indicated  Housing Interventions Intervention Not Indicated  Transportation Interventions Intervention Not Indicated        Care Coordination Interventions:  Yes, provided   Follow up plan: No further intervention required.   Encounter Outcome:  Pt. Visit Completed   Quinn Plowman RN,BSN,CCM [place] [phone] direct line

## 2023-01-12 ENCOUNTER — Encounter: Payer: Self-pay | Admitting: Family Medicine

## 2023-01-12 ENCOUNTER — Ambulatory Visit (INDEPENDENT_AMBULATORY_CARE_PROVIDER_SITE_OTHER): Payer: Medicare Other | Admitting: Family Medicine

## 2023-01-12 VITALS — BP 160/88 | HR 60 | Temp 97.9°F | Ht 66.0 in | Wt 140.8 lb

## 2023-01-12 DIAGNOSIS — K5900 Constipation, unspecified: Secondary | ICD-10-CM | POA: Diagnosis not present

## 2023-01-12 DIAGNOSIS — N9489 Other specified conditions associated with female genital organs and menstrual cycle: Secondary | ICD-10-CM

## 2023-01-12 DIAGNOSIS — M5442 Lumbago with sciatica, left side: Secondary | ICD-10-CM

## 2023-01-12 MED ORDER — ONDANSETRON HCL 4 MG PO TABS: 4.0000 mg | ORAL_TABLET | Freq: Three times a day (TID) | ORAL | 0 refills | Status: DC | PRN

## 2023-01-12 MED ORDER — PREDNISONE 20 MG PO TABS: 20.0000 mg | ORAL_TABLET | Freq: Every day | ORAL | 0 refills | Status: DC

## 2023-01-12 NOTE — Assessment & Plan Note (Signed)
Acute, current symptoms very consistent with left-sided sciatica.  No red flags or indication for x-ray.  Will treat with prednisone taper, heat and home physical therapy.  Return and ER precautions provided.

## 2023-01-12 NOTE — Patient Instructions (Addendum)
Restart mirialax 17 gm daily , and increase water and fiber.  Can use tylenol 500 mg 2 tablet three times daily if needed.  Complete prednisone taper.  Start heat and home physical therapy.  Cal if not improving as expected in 2 week for further evaluation.

## 2023-01-12 NOTE — Progress Notes (Signed)
Patient ID: PURITY BENET, female    DOB: 15-Feb-1941, 82 y.o.   MRN: [id number]  This visit was conducted in person.  BP (!) 160/88 (BP Location: Left Arm)   Pulse 60   Temp 97.9 F (36.6 C) (Temporal)   Ht 5\' 6"  (1.676 m)   Wt 140 lb 12.8 oz (63.9 kg)   SpO2 98%   BMI 22.73 kg/m    CC:  Chief Complaint  Patient presents with   Hip Pain    Subjective:   HPI: [name] is a 82 y.o. female presenting on 01/12/2023 for Hip Pain   She has history of chronic left hip pain, most recently completely resolved after treatment of chronic constipation using MiraLAX 17 mg p.o. daily. Plain films showed no evidence of osteoarthritis of left hip.  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 Reviewed most recent office visit note from December 28, 2022 Dr. Fransisca Connors Has pending pelvic [name] for better characterization and may consider laparoscopic resection to rule out malignancy.     Today she reports recurrence of pain in left hip in last week. Pain in left buttock and anterior  left hip.  No radiation of pain to leg.  No new numbness or weakness.  No falls.  Tylenol off and on  Has daily BM but still firm.. has not been using a med for constipation.   NSAIDs contraindicated as she is on Eliquis.   Relevant past medical, surgical, family and social history reviewed and updated as indicated. Interim medical history since our last visit reviewed. Allergies and medications reviewed and updated. Outpatient Medications Prior to Visit  Medication Sig Dispense Refill   acetaminophen (TYLENOL) 325 MG tablet Take 325 mg by mouth every 6 (six) hours as needed for mild pain.     amiodarone (PACERONE) 200 MG tablet TAKE 1/2 TABLET (100 MG TOTAL) BY MOUTH DAILY 45 tablet 3   apixaban (ELIQUIS) 2.5 MG TABS tablet Take 1 tablet (2.5 mg total) by mouth 2 (two) times daily. 180 tablet 3   D 1000 25 MCG (1000 UT) capsule TAKE 1 CAPSULE BY MOUTH AT BEDTIME. 30 capsule 3   donepezil  (ARICEPT) 5 MG tablet Take 1 tablet (5 mg total) by mouth at bedtime. 30 tablet 11   doxazosin (CARDURA) 2 MG tablet Take 1 tablet (2 mg total) by mouth daily. 90 tablet 3   furosemide (LASIX) 40 MG tablet Take 0.5 tablets (20 mg total) by mouth as needed for fluid or edema. 30 tablet 3   hydrALAZINE (APRESOLINE) 50 MG tablet Take 1 tablet (50 mg total) by mouth every 8 (eight) hours as needed. FOR BLOOD PRESSURE ABOVE 150 30 tablet 0   LUMIGAN 0.01 % SOLN SMARTSIG:In Eye(s)     pantoprazole (PROTONIX) 40 MG tablet TAKE 1 TABLET BY MOUTH ONCE DAILY 30 tablet 11   potassium chloride SA (KLOR-CON M) 20 MEQ tablet TAKE 1 TABLET BY MOUTH DAILY. TAKE WHEN TAKING FUROSEMIDE. 30 tablet 3   rosuvastatin (CRESTOR) 10 MG tablet Take 1 tablet (10 mg total) by mouth daily. 90 tablet 3   ondansetron (ZOFRAN) 4 MG tablet Take 1-2 tablets (4-8 mg total) by mouth every 8 (eight) hours as needed for nausea or vomiting. 30 tablet 0   No facility-administered medications prior to visit.     Per HPI unless specifically indicated in ROS section below Review of Systems  Constitutional:  Negative for fatigue and fever.  HENT:  Negative for congestion.  Eyes:  Negative for pain.  Respiratory:  Negative for cough and shortness of breath.   Cardiovascular:  Negative for chest pain, palpitations and leg swelling.  Gastrointestinal:  Negative for abdominal pain.  Genitourinary:  Negative for dysuria and vaginal bleeding.  Musculoskeletal:  Positive for back pain.  Neurological:  Negative for syncope, light-headedness and headaches.  Psychiatric/Behavioral:  Negative for dysphoric mood.    Objective:  BP (!) 160/88 (BP Location: Left Arm)   Pulse 60   Temp 97.9 F (36.6 C) (Temporal)   Ht 5\' 6"  (1.676 m)   Wt 140 lb 12.8 oz (63.9 kg)   SpO2 98%   BMI 22.73 kg/m   Wt Readings from Last 3 Encounters:  01/12/23 140 lb 12.8 oz (63.9 kg)  12/28/22 147 lb 11.2 oz (67 kg)  12/08/22 143 lb (64.9 kg)       Physical Exam Constitutional:      General: She is not in acute distress.    Appearance: Normal appearance. She is well-developed. She is not ill-appearing or toxic-appearing.  HENT:     Head: Normocephalic.     Right Ear: Hearing, tympanic membrane, ear canal and external ear normal. Tympanic membrane is not erythematous, retracted or bulging.     Left Ear: Hearing, tympanic membrane, ear canal and external ear normal. Tympanic membrane is not erythematous, retracted or bulging.     Nose: No mucosal edema or rhinorrhea.     Right Sinus: No maxillary sinus tenderness or frontal sinus tenderness.     Left Sinus: No maxillary sinus tenderness or frontal sinus tenderness.     Mouth/Throat:     Pharynx: Uvula midline.  Eyes:     General: Lids are normal. Lids are everted, no foreign bodies appreciated.     Conjunctiva/sclera: Conjunctivae normal.     Pupils: Pupils are equal, round, and reactive to light.  Neck:     Thyroid: No thyroid mass or thyromegaly.     Vascular: No carotid bruit.     Trachea: Trachea normal.  Cardiovascular:     Rate and Rhythm: Normal rate and regular rhythm.     Pulses: Normal pulses.     Heart sounds: Normal heart sounds, S1 normal and S2 normal. No murmur heard.    No friction rub. No gallop.  Pulmonary:     Effort: Pulmonary effort is normal. No tachypnea or respiratory distress.     Breath sounds: Normal breath sounds. No decreased breath sounds, wheezing, rhonchi or rales.  Abdominal:     General: Bowel sounds are normal.     Palpations: Abdomen is soft.     Tenderness: There is no abdominal tenderness.  Musculoskeletal:     Cervical back: Normal range of motion and neck supple.     Lumbar back: Tenderness present. No bony tenderness. Decreased range of motion. Positive left straight leg raise test.  Skin:    General: Skin is warm and dry.     Findings: No rash.  Neurological:     Mental Status: She is alert.  Psychiatric:        Mood and  Affect: Mood is not anxious or depressed.        Speech: Speech normal.        Behavior: Behavior normal. Behavior is cooperative.        Thought Content: Thought content normal.        Judgment: Judgment normal.       Results for orders placed or performed in visit  on 12/08/22  VITAMIN D 25 Hydroxy (Vit-D Deficiency, Fractures)  Result Value Ref Range   VITD 25.77 (L) 30.00 - 100.00 ng/mL  Vitamin B12  Result Value Ref Range   Vitamin B-12 710 211 - 911 pg/mL  TSH  Result Value Ref Range   TSH 1.65 0.35 - 5.50 uIU/mL    Assessment and Plan  Acute left-sided low back pain with left-sided sciatica Assessment & Plan: Acute, current symptoms very consistent with left-sided sciatica.  No red flags or indication for x-ray.  Will treat with prednisone taper, heat and home physical therapy.  Return and ER precautions provided.   Constipation, unspecified constipation type Assessment & Plan: Chronic, not clearly causing pain today as it is in a different location. Encouraged increased water, fiber and restarting MiraLAX 17 g daily to keep stools soft and avoid backup.   Adnexal mass Assessment & Plan: Has upcoming [name] pelvis scheduled to evaluate this further.  Followed by Gyn Onc.   Other orders -     Ondansetron HCl; Take 1-2 tablets (4-8 mg total) by mouth every 8 (eight) hours as needed for nausea or vomiting.  Dispense: 30 tablet; Refill: 0 -     predniSONE; Take 1 tablet (20 mg total) by mouth daily with breakfast.  Dispense: 15 tablet; Refill: 0    No follow-ups on file.   Eliezer Lofts, MD

## 2023-01-12 NOTE — Assessment & Plan Note (Signed)
Chronic, not clearly causing pain today as it is in a different location. Encouraged increased water, fiber and restarting MiraLAX 17 g daily to keep stools soft and avoid backup.

## 2023-01-12 NOTE — Assessment & Plan Note (Signed)
Has upcoming [name] pelvis scheduled to evaluate this further.  Followed by Gyn Onc.

## 2023-01-27 DIAGNOSIS — H5711 Ocular pain, right eye: Secondary | ICD-10-CM | POA: Diagnosis not present

## 2023-01-27 DIAGNOSIS — H35341 Macular cyst, hole, or pseudohole, right eye: Secondary | ICD-10-CM | POA: Diagnosis not present

## 2023-01-27 DIAGNOSIS — H401131 Primary open-angle glaucoma, bilateral, mild stage: Secondary | ICD-10-CM | POA: Diagnosis not present

## 2023-01-27 DIAGNOSIS — H2512 Age-related nuclear cataract, left eye: Secondary | ICD-10-CM | POA: Diagnosis not present

## 2023-02-02 ENCOUNTER — Ambulatory Visit (INDEPENDENT_AMBULATORY_CARE_PROVIDER_SITE_OTHER): Payer: Medicare Other | Admitting: Family Medicine

## 2023-02-02 ENCOUNTER — Encounter: Payer: Self-pay | Admitting: Family Medicine

## 2023-02-02 VITALS — BP 130/60 | HR 64 | Temp 97.5°F | Ht 66.0 in | Wt 142.0 lb

## 2023-02-02 DIAGNOSIS — L820 Inflamed seborrheic keratosis: Secondary | ICD-10-CM

## 2023-02-02 DIAGNOSIS — K5900 Constipation, unspecified: Secondary | ICD-10-CM

## 2023-02-02 DIAGNOSIS — K5909 Other constipation: Secondary | ICD-10-CM | POA: Diagnosis not present

## 2023-02-02 DIAGNOSIS — R110 Nausea: Secondary | ICD-10-CM | POA: Diagnosis not present

## 2023-02-02 NOTE — Progress Notes (Signed)
Patient ID: [name], female    DOB: [birth year], 82 y.o.   MRN: [id number]  This visit was conducted in person.  BP 130/60   Pulse 64   Temp (!) 97.5 F (36.4 C) (Temporal)   Ht 5\' 6"  (1.676 m)   Wt 142 lb (64.4 kg)   SpO2 97%   BMI 22.92 kg/m    CC:  Chief Complaint  Patient presents with   Nevus    On Neck    Nausea    Subjective:   HPI: [name] is a 82 y.o. female presenting on 02/02/2023 for Nevus (On Neck/) and Nausea   She has noted  skin lesion on left lower neck present for years... bigger in last few months.   Area rubs and get irritated.  No bleeding.  No itchiness.  Continued chronic nausea.Marland Kitchen still issues with firm intermittent stools/constipation... not regularly using miralax or fiber. Poor po intake.. today almost 5 pom she has only had a shake. On pantoprazole 40 mg daily  Colonoscopy 2009 Dr. Larae Grooms     Relevant past medical, surgical, family and social history reviewed and updated as indicated. Interim medical history since our last visit reviewed. Allergies and medications reviewed and updated. Outpatient Medications Prior to Visit  Medication Sig Dispense Refill   acetaminophen (TYLENOL) 325 MG tablet Take 325 mg by mouth every 6 (six) hours as needed for mild pain.     amiodarone (PACERONE) 200 MG tablet TAKE 1/2 TABLET (100 MG TOTAL) BY MOUTH DAILY 45 tablet 3   apixaban (ELIQUIS) 2.5 MG TABS tablet Take 1 tablet (2.5 mg total) by mouth 2 (two) times daily. 180 tablet 3   D 1000 25 MCG (1000 UT) capsule TAKE 1 CAPSULE BY MOUTH AT BEDTIME. 30 capsule 3   donepezil (ARICEPT) 5 MG tablet Take 1 tablet (5 mg total) by mouth at bedtime. 30 tablet 11   doxazosin (CARDURA) 2 MG tablet Take 1 tablet (2 mg total) by mouth daily. 90 tablet 3   furosemide (LASIX) 40 MG tablet Take 0.5 tablets (20 mg total) by mouth as needed for fluid or edema. 30 tablet 3   hydrALAZINE (APRESOLINE) 50 MG tablet Take 1 tablet (50 mg total) by mouth  every 8 (eight) hours as needed. FOR BLOOD PRESSURE ABOVE 150 30 tablet 0   LUMIGAN 0.01 % SOLN SMARTSIG:In Eye(s)     ondansetron (ZOFRAN) 4 MG tablet Take 1-2 tablets (4-8 mg total) by mouth every 8 (eight) hours as needed for nausea or vomiting. 30 tablet 0   pantoprazole (PROTONIX) 40 MG tablet TAKE 1 TABLET BY MOUTH ONCE DAILY 30 tablet 11   potassium chloride SA (KLOR-CON M) 20 MEQ tablet TAKE 1 TABLET BY MOUTH DAILY. TAKE WHEN TAKING FUROSEMIDE. 30 tablet 3   predniSONE (DELTASONE) 20 MG tablet Take 1 tablet (20 mg total) by mouth daily with breakfast. 15 tablet 0   rosuvastatin (CRESTOR) 10 MG tablet Take 1 tablet (10 mg total) by mouth daily. 90 tablet 3   No facility-administered medications prior to visit.     Per HPI unless specifically indicated in ROS section below Review of Systems  Constitutional:  Positive for appetite change. Negative for fatigue and fever.  HENT:  Negative for congestion.   Eyes:  Negative for pain.  Respiratory:  Negative for cough and shortness of breath.   Cardiovascular:  Negative for chest pain, palpitations and leg swelling.  Gastrointestinal:  Positive for nausea. Negative for abdominal  pain.  Genitourinary:  Negative for dysuria and vaginal bleeding.  Musculoskeletal:  Negative for back pain.  Neurological:  Negative for syncope, light-headedness and headaches.  Psychiatric/Behavioral:  Negative for dysphoric mood.    Objective:  BP 130/60   Pulse 64   Temp (!) 97.5 F (36.4 C) (Temporal)   Ht 5\' 6"  (1.676 m)   Wt 142 lb (64.4 kg)   SpO2 97%   BMI 22.92 kg/m   Wt Readings from Last 3 Encounters:  02/02/23 142 lb (64.4 kg)  01/12/23 140 lb 12.8 oz (63.9 kg)  12/28/22 147 lb 11.2 oz (67 kg)      Physical Exam Constitutional:      General: She is not in acute distress.    Appearance: Normal appearance. She is well-developed. She is not ill-appearing or toxic-appearing.  HENT:     Head: Normocephalic.     Right Ear: Hearing,  tympanic membrane, ear canal and external ear normal. Tympanic membrane is not erythematous, retracted or bulging.     Left Ear: Hearing, tympanic membrane, ear canal and external ear normal. Tympanic membrane is not erythematous, retracted or bulging.     Nose: No mucosal edema or rhinorrhea.     Right Sinus: No maxillary sinus tenderness or frontal sinus tenderness.     Left Sinus: No maxillary sinus tenderness or frontal sinus tenderness.     Mouth/Throat:     Pharynx: Uvula midline.  Eyes:     General: Lids are normal. Lids are everted, no foreign bodies appreciated.     Conjunctiva/sclera: Conjunctivae normal.     Pupils: Pupils are equal, round, and reactive to light.  Neck:     Thyroid: No thyroid mass or thyromegaly.     Vascular: No carotid bruit.     Trachea: Trachea normal.  Cardiovascular:     Rate and Rhythm: Normal rate and regular rhythm.     Pulses: Normal pulses.     Heart sounds: Normal heart sounds, S1 normal and S2 normal. No murmur heard.    No friction rub. No gallop.  Pulmonary:     Effort: Pulmonary effort is normal. No tachypnea or respiratory distress.     Breath sounds: Normal breath sounds. No decreased breath sounds, wheezing, rhonchi or rales.  Abdominal:     General: Bowel sounds are normal.     Palpations: Abdomen is soft.     Tenderness: There is no abdominal tenderness.  Musculoskeletal:     Cervical back: Normal range of motion and neck supple.  Skin:    General: Skin is warm and dry.     Findings: No rash.     Comments: Large 1 cm SK/skin tag left upper chest wall over clavicle   Neurological:     Mental Status: She is alert.  Psychiatric:        Mood and Affect: Mood is not anxious or depressed.        Speech: Speech normal.        Behavior: Behavior normal. Behavior is cooperative.        Thought Content: Thought content normal.        Judgment: Judgment normal.       Results for orders placed or performed in visit on 12/08/22   VITAMIN D 25 Hydroxy (Vit-D Deficiency, Fractures)  Result Value Ref Range   VITD 25.77 (L) 30.00 - 100.00 ng/mL  Vitamin B12  Result Value Ref Range   Vitamin B-12 710 211 - 911 pg/mL  TSH  Result Value Ref Range   TSH 1.65 0.35 - 5.50 uIU/mL    Assessment and Plan  Chronic nausea Assessment & Plan: Chronic, likely multifactorial.  She is already on pantoprazole 40 mg p.o. daily.  Unclear etiology of nausea, initially felt likely secondary to chronic constipation.  She is working on treating the constipation as below.  She uses Zofran daily for nausea.  She has not lost any weight but has poor nutritional intake given her nausea. No clear medication side effect. Given persistent symptoms, I have recommended referral to gastroenterologist for consideration of endoscopy.  Orders: -     Ambulatory referral to Gastroenterology  Inflamed seborrheic keratosis Assessment & Plan: Procedure Note for Cryotherapy Dx: irritated inflammed SK Location: left upper chest wall Size: 1 cm Patient gave consent for procedure . Patient notified of potential for scarring at site of cryotherapy. Area of concern treated with cryotherapy for 3 freeze thaw cycles with a 1 mm border of freeze. No complications.    Chronic constipation -     Ambulatory referral to Gastroenterology  Constipation, unspecified constipation type Assessment & Plan: Chronic, inadequate control Encouraged her to continue to increase water, fiber and to take MiraLAX 17 g p.o. daily (this seems to help but then she stops the regimen).  I recommended to her that this would likely be a long-term plan and that she could can continue it indefinitely.      No follow-ups on file.   Kerby Nora, MD

## 2023-02-03 DIAGNOSIS — L820 Inflamed seborrheic keratosis: Secondary | ICD-10-CM | POA: Insufficient documentation

## 2023-02-03 NOTE — Assessment & Plan Note (Signed)
Chronic, likely multifactorial.  She is already on pantoprazole 40 mg p.o. daily.  Unclear etiology of nausea, initially felt likely secondary to chronic constipation.  She is working on treating the constipation as below.  She uses Zofran daily for nausea.  She has not lost any weight but has poor nutritional intake given her nausea. No clear medication side effect. Given persistent symptoms, I have recommended referral to gastroenterologist for consideration of endoscopy.

## 2023-02-03 NOTE — Assessment & Plan Note (Signed)
Procedure Note for Cryotherapy Dx: irritated inflammed SK Location: left upper chest wall Size: 1 cm Patient gave consent for procedure . Patient notified of potential for scarring at site of cryotherapy. Area of concern treated with cryotherapy for 3 freeze thaw cycles with a 1 mm border of freeze. No complications.

## 2023-02-03 NOTE — Assessment & Plan Note (Signed)
Chronic, inadequate control Encouraged her to continue to increase water, fiber and to take MiraLAX 17 g p.o. daily (this seems to help but then she stops the regimen).  I recommended to her that this would likely be a long-term plan and that she could can continue it indefinitely.

## 2023-02-09 ENCOUNTER — Ambulatory Visit: Payer: Medicare Other | Admitting: Physician Assistant

## 2023-02-09 ENCOUNTER — Other Ambulatory Visit: Payer: Self-pay

## 2023-02-09 ENCOUNTER — Encounter: Payer: Self-pay | Admitting: Oncology

## 2023-02-09 ENCOUNTER — Encounter: Payer: Self-pay | Admitting: Physician Assistant

## 2023-02-09 VITALS — BP 144/67 | HR 59 | Temp 98.2°F | Ht 66.0 in | Wt 141.6 lb

## 2023-02-09 DIAGNOSIS — K769 Liver disease, unspecified: Secondary | ICD-10-CM

## 2023-02-09 DIAGNOSIS — K5900 Constipation, unspecified: Secondary | ICD-10-CM | POA: Diagnosis not present

## 2023-02-09 DIAGNOSIS — D638 Anemia in other chronic diseases classified elsewhere: Secondary | ICD-10-CM

## 2023-02-09 DIAGNOSIS — K7689 Other specified diseases of liver: Secondary | ICD-10-CM

## 2023-02-09 DIAGNOSIS — R110 Nausea: Secondary | ICD-10-CM

## 2023-02-09 DIAGNOSIS — R1013 Epigastric pain: Secondary | ICD-10-CM | POA: Diagnosis not present

## 2023-02-09 MED ORDER — NA SULFATE-K SULFATE-MG SULF 17.5-3.13-1.6 GM/177ML PO SOLN: 1.0000 | Freq: Once | ORAL | 0 refills | Status: AC

## 2023-02-09 NOTE — Progress Notes (Signed)
Celso Amy, PA-C [address]  [address]  [place], [place] [address]  Main: [phone]  Fax: [phone]   Gastroenterology Consultation  Referring Provider:     Excell Seltzer, MD Primary Care Physician:  Excell Seltzer, MD Primary Gastroenterologist:  Dr. Lannette Donath  Reason for Consultation:     Chronic Nausea, Chronic Constipation        HPI:   [name] is a 82 y.o. y/o female referred for consultation & management of chronic nausea and chronic constipation by Excell Seltzer, MD. She is here with her husband today.  She recently saw Dr. Ermalene Searing 02/02/2023 for chronic nausea and chronic constipation.  Has been on pantoprazole 40 Mg daily and MiraLAX as needed with little benefit.  Etiology for nausea uncertain, not improved on PPI, and was referred to GI.  No previous EGD.  She continues to have worsening nausea in the mornings.  Cannot eat due to nausea.  No vomiting.  She is having abdominal pain located in the epigastrium and bilateral lower abdomen.  Has early satiety.  Having small hard BM once per week.  Taking Miralax and Metamucil with no benefit.  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  Normal gallbladder and pancreas.  Moderate stool throughout the colon consistent with constipation.  Previous hysterectomy.  No other acute abnormality.  Pelvic transvaginal ultrasound 10/2022 (to follow-up left adnexal mass) showed persistent fluid-filled mass in the left adnexa and previous hysterectomy.  She has been scheduled for pelvic [name] with and without contrast 02/13/2023 for follow-up.  Labs 10/2022 showed CKD with BUN 28, creatinine 1.97, GFR 28.  Normal LFTs.  Normocytic anemia with hemoglobin 10.3, hematocrit 30.9, MCV 92, platelets 160.  Normal total iron 83, iron saturation 24, ferritin 38.  She has stable chronic anemia, likely anemia  of chronic disease.  Vitamin B12 normal.  Low vitamin D.  Normal TSH.  Normal CA125.  **Patient saw hematology 10/2022 to evaluate anemia.  Diagnosed with anemia of chronic disease.  Long history of anemia for many years.  Hemoglobin ranges between 10 and 11 since 2009.  Last colonoscopy done by Dr. Rob Bunting (for anemia) in 2009 was normal.  10-year repeat screening recommended.  She has not had a colonoscopy since 2009.  Echocardiogram 12/2021 showed normal LVEF 60 to 65%.  She takes Eliquis, history of paroxysmal A-fib (ablation in 2013).  Cardiologist Dr. Chilton Si.     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 cath 2/09: no CAD, EF 70%   GERD (gastroesophageal reflux disease) 07/26/2021   HYPERLIPIDEMIA    HYPERTENSION    Hypertensive urgency 07/19/2022   Mild Aortic insufficiency    Moderate mitral regurgitation    Moderate tricuspid regurgitation    OSTEOPENIA    PAF (paroxysmal atrial fibrillation) (HCC)    a. s/p ablation 2013; b. CHADS2VASc -> 4 (HTN, age x 2, female)-->Eliquis.   PAT (paroxysmal atrial tachycardia)    a. 04/2019 Zio: Occas PACs and rare PVCs. 21 atrial runs - longest 20 beats, max rate 169.   Pneumonia 2009   RA (rheumatoid arthritis) (HCC)    Sinus Bradycardia    a. asymptomatic but prevents  use of AVN blocking agents; b. 04/2019 Zio: Avg HR 61 (37-109).   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 05/2019 Echo: EF 60-65%. DD. RVSP 41.97mmHg. Mod dil LA. Mod MR/TR. Mild AI.    Past Surgical History:  Procedure Laterality Date   ABLATION OF DYSRHYTHMIC FOCUS     CARDIAC CATHETERIZATION     COLONOSCOPY  04/18/08   Partial hysterectomy--1979     Thoracentesis   12/20/07      Prior to Admission medications   Medication Sig Start Date End Date Taking? Authorizing Provider  acetaminophen  (TYLENOL) 325 MG tablet Take 325 mg by mouth every 6 (six) hours as needed for mild pain.    [provider]  amiodarone (PACERONE) 200 MG tablet TAKE 1/2 TABLET (100 MG TOTAL) BY MOUTH DAILY 07/19/22   Chilton Si, MD  apixaban (ELIQUIS) 2.5 MG TABS tablet Take 1 tablet (2.5 mg total) by mouth 2 (two) times daily. 07/19/22   Chilton Si, MD  D 1000 25 MCG (1000 UT) capsule TAKE 1 CAPSULE BY MOUTH AT BEDTIME. 10/01/21   Chilton Si, MD  donepezil (ARICEPT) 5 MG tablet Take 1 tablet (5 mg total) by mouth at bedtime. 12/08/22   Bedsole, Amy E, MD  doxazosin (CARDURA) 2 MG tablet Take 1 tablet (2 mg total) by mouth daily. 08/02/22   Chilton Si, MD  furosemide (LASIX) 40 MG tablet Take 0.5 tablets (20 mg total) by mouth as needed for fluid or edema. 07/19/22   Chilton Si, MD  hydrALAZINE (APRESOLINE) 50 MG tablet Take 1 tablet (50 mg total) by mouth every 8 (eight) hours as needed. FOR BLOOD PRESSURE ABOVE 150 07/19/22   Chilton Si, MD  LUMIGAN 0.01 % SOLN SMARTSIG:In Eye(s) 04/01/22   [provider]  ondansetron (ZOFRAN) 4 MG tablet Take 1-2 tablets (4-8 mg total) by mouth every 8 (eight) hours as needed for nausea or vomiting. 01/12/23   Bedsole, Amy E, MD  pantoprazole (PROTONIX) 40 MG tablet TAKE 1 TABLET BY MOUTH ONCE DAILY 06/22/22   Chilton Si, MD  potassium chloride SA (KLOR-CON M) 20 MEQ tablet TAKE 1 TABLET BY MOUTH DAILY. TAKE WHEN TAKING FUROSEMIDE. 07/19/22   Bedsole, Amy E, MD  predniSONE (DELTASONE) 20 MG tablet Take 1 tablet (20 mg total) by mouth daily with breakfast. 01/12/23   Bedsole, Amy E, MD  rosuvastatin (CRESTOR) 10 MG tablet Take 1 tablet (10 mg total) by mouth daily. 07/19/22   Chilton Si, MD    Family History  Problem Relation Age of Onset   Diabetes Other    Breast cancer Paternal Aunt 33   Heart disease Mother    Pancreatic cancer Father    Atrial fibrillation Brother    Heart disease Brother    Heart  attack Brother      Social History   Tobacco Use   Smoking status: Never   Smokeless tobacco: Never  Vaping Use   Vaping Use: Never used  Substance Use Topics   Alcohol use: No    Alcohol/week: 0.0 standard drinks of alcohol   Drug use: No    Allergies as of 02/09/2023 - Review Complete 02/09/2023  Allergen Reaction Noted   Amlodipine  04/05/2019   Celecoxib Other (See Comments) and Rash 02/02/2013   Tramadol Nausea Only 09/11/2013    Review of Systems:    All systems reviewed and negative except where noted in HPI.   Physical Exam:  BP (!) 144/67 (BP Location:  Left Arm, Patient Position: Sitting, Cuff Size: Normal)   Pulse (!) 59   Temp 98.2 F (36.8 C) (Oral)   Ht 5\' 6"  (1.676 m)   Wt 141 lb 9.6 oz (64.2 kg)   BMI 22.85 kg/m  No LMP recorded. Patient has had a hysterectomy. Psych:  Alert and cooperative. Normal mood and affect. General:   Alert,  Well-developed, elderly female, well-nourished, pleasant and cooperative in NAD;  Walks with no assistive devices.  Is able to get on and off exam table. Head:  Normocephalic and atraumatic. Eyes:  Sclera clear, no icterus.   Conjunctiva pink. Lungs:  Respirations even and unlabored.  Clear throughout to auscultation.   No wheezes, crackles, or rhonchi. No acute distress. Heart:  Regular rate and rhythm; Grade 2/6 systolic murmur;  No clicks, rubs, or gallops. Abdomen:  Normal bowel sounds.  No bruits.  There is Moderate Epigastric tendernes with fullness palpable in the epigastrium.  No RUQ or Lower abdominal tenderness.  No hepatosplenomegaly or hernias noted. No ascites. No guarding or rebound tenderness.    Neurologic:  Alert and oriented x3;  grossly normal neurologically. Psych:  Alert and cooperative. Normal mood and affect.  Imaging Studies: No results found.  Assessment and Plan:   LYNDSEY DEMOS is a 82 y.o. y/o female has been referred for chronic nausea and chronic constipation.    Nausea  Scheduling EGD  for Further Evaluation.  I have discussed risks & benefits of the procedure  which include, but are not limited to, bleeding, infection, perforation,respiratory compromise & drug reaction.  The patient agrees with this plan & written consent will be obtained.     Constipation Schedule colonoscopy given her worsening constipation / change in bowel habits. I gave samples of Linzess 72 and 145 mcg to try.  1 capsule once daily.  She will call back for prescription if sample works well.  Let us know which dose she likes.  Abdominal pain  Schedule EGD and colonoscopy.  Proceed with abdominal and pelvic [name].  Hepatic cyst  We will call [facility] imaging and see if they can add on abdominal [name] in addition to pelvic [name].  Anemia of chronic disease / CKD  Reassurance.  PCP to monitor labs.  Schedule EGD and colonoscopy to exclude sources of anemia.  Left Ovary Mass  Continue with plan for pelvic [name] as scheduled.  7.  Paroxysmal atrial fibrillation, currently on Eliquis  Obtain cardiac clearance and permission to hold Eliquis 2 days prior to procedures from her cardiologist Dr. Chilton Si.  Follow up in 4 weeks after colonoscopy and EGD.  Celso Amy, PA-C

## 2023-02-13 ENCOUNTER — Ambulatory Visit
Admission: RE | Admit: 2023-02-13 | Discharge: 2023-02-13 | Disposition: A | Discharge status: Home / Self Care | Payer: Medicare Other | Source: Ambulatory Visit | Attending: Obstetrics and Gynecology | Admitting: Obstetrics and Gynecology

## 2023-02-13 DIAGNOSIS — N9489 Other specified conditions associated with female genital organs and menstrual cycle: Secondary | ICD-10-CM | POA: Insufficient documentation

## 2023-02-13 DIAGNOSIS — R1031 Right lower quadrant pain: Secondary | ICD-10-CM | POA: Diagnosis not present

## 2023-02-13 MED ORDER — GADOBUTROL 1 MMOL/ML IV SOLN
6.0000 mL | Freq: Once | INTRAVENOUS | Status: AC | PRN
  Administered 2023-02-13: 6 mL via INTRAVENOUS

## 2023-02-15 ENCOUNTER — Inpatient Hospital Stay: Payer: Medicare Other | Attending: Oncology | Admitting: Obstetrics and Gynecology

## 2023-02-15 VITALS — BP 148/57 | HR 46 | Temp 97.9°F | Resp 19 | Wt 136.8 lb

## 2023-02-15 DIAGNOSIS — Z90710 Acquired absence of both cervix and uterus: Secondary | ICD-10-CM | POA: Insufficient documentation

## 2023-02-15 DIAGNOSIS — D3912 Neoplasm of uncertain behavior of left ovary: Secondary | ICD-10-CM | POA: Insufficient documentation

## 2023-02-15 DIAGNOSIS — N9489 Other specified conditions associated with female genital organs and menstrual cycle: Secondary | ICD-10-CM

## 2023-02-15 NOTE — Progress Notes (Signed)
[facility] Consult Visit   Referring Provider: PCP Dr Ermalene Searing  Chief Concern: incidentally discovered left adnexal mass  Subjective:  [name] is a 82 y.o. female who is seen in consultation from Dr. Ermalene Searing for follow up for incidental adnexal mass, low ROMA score. She has been followed conservatively with imaging.   She continues to feel at baseline.  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 Rarely has LLQ abdominal pain.  [name] done today, but no report.  [place] 10/20/22 US pelvis FINDINGS: Measurements: Prior hysterectomy. No abnormality of the vaginal cuff. Right ovary Measurements: Not visualized Left ovary Measurements: Not visualized Other findings   Within the LEFT adnexal region, there is a tubular structure measuring 2.1 x 5.1 x 2.1 centimeters./on today's exam, there are at least 2 mural nodules within this mass, measuring 0.7 x 0.5 x 0.8 centimeters and 0.4 x 0.4 x 0.7 centimeters. Doppler evaluation shows no internal blood flow in the structures. There is layering debris within this mass. There is no free pelvic fluid.   IMPRESSION: 1. Persistent tubular fluid-filled mass within the LEFT adnexal region containing layering debris. On today's exam, there are 2 discrete mural nodules in this mass, warranting further evaluation. Recommend further characterization with [name] of the pelvis with intravenous contrast. 2. Prior hysterectomy. 3. Nonvisualized ovaries.  Gynecologic History:  Presented to pcp for hip pain. Imaging was performed 7/22 which showed incidental adnexal mass.   CKD with anemia. Bradycardia  04/28/21 [name]  IMPRESSION: 1. No acute osseous findings.  Minimal left hip osteoarthritis. 2. Tendinosis with low-grade insertional tearing of the bilateral gluteus minimus tendons. 3. Mild bilateral gluteus medius tendinosis without tear. 4. Mild tendinosis of the bilateral hamstring tendon origins. 5. Left adnexal cyst measuring up to 3.9 cm with  possible small nodule along its superior aspect. Pelvic ultrasound is recommended for further characterization.  Pelvic US 06/09/21 Post hysterectomy with nonvisualization of normal appearing ovaries.5.2 x 1.7 x 1.8 cm diameter complex cystic nodule in LEFT adnexa question of ovarian or paraovarian origin, complicated by scattered internal echoes and a 5 mm mural nodule which lacks internal blood flow on color Doppler imaging.   US Pelvic complete 11/04/2021 FINDINGS: Uterus: Surgically absent Endometrium: Surgically absent Right ovary: Not visualized, likely obscured by bowel Left ovary: No normal appearing LEFT ovary visualized, see below Other findings: Complex cyst LEFT adnexa 4.6 x 1.6 x 1.9 cm (previously 5.2 x 1.7 x 1.8 cm), containing a dependent debris fluid level. Single small mural nodule within the cystic lesion, 5 x 5 x 3 mm, measured 5 x 5 x 5 mm on previous exam. No septations, wall thickening, or additional mural nodules. No free pelvic fluid or additional pelvic masses. IMPRESSION: - Surgical absence of uterus with nonvisualization of RIGHT ovary. - Minimally complicated cyst of the LEFT adnexa 4.6 cm greatest size slightly decreased from the 5.2 cm on the previous exam, containing a dependent fluid level consistent with old hemorrhage or debris. - The small nodular focus within the lesion is little changed, remaining 5 mm greatest diameter. - No other pelvic sonographic abnormalities.  03/22/22 CT  FINDINGS: Lower chest: No acute abnormality   Hepatobiliary: Large cyst in the left hepatic lobe measures 5.2 cm. Smaller scattered subcentimeter cysts throughout the liver. Gallbladder unremarkable.   Pancreas: No focal abnormality or ductal dilatation.   Spleen: No focal abnormality.  Normal size.   Adrenals/Urinary Tract: 4.2 cm left lower pole renal cyst. No stones or hydronephrosis bilaterally. Adrenal glands  and urinary bladder unremarkable.   Stomach/Bowel: Moderate  stool burden throughout the colon. Normal appendix. Stomach, large and small bowel grossly unremarkable.   Vascular/Lymphatic: Aortic atherosclerosis. No evidence of aneurysm or adenopathy.   Reproductive: Prior hysterectomy.  No adnexal masses.   Other: No free fluid or free air.   Musculoskeletal: No acute bony abnormality.   IMPRESSION: No acute findings in the abdomen or pelvis.   Hepatic and left renal cysts.   Aortic atherosclerosis.   Moderate stool burden.     05/10/22- US Pelvic Complete with Transvaginal Uterus: surgically absent Endometrium: surgically absent.  Right ovary: surgically absent Left Ovary: no normal appearing left ovary visualized Other: No free pelvic fluid. Oblong cystic structure identified in LEFT adnexa 4.5 x 2.0 x 1.5 cm, containing mobile debris. This is littlechanged in size since the 5.2 x 1.7 x 1.8 cm on 06/09/2021. Debris fluid level again noted. The echogenic nodule seen in 2022 is now seen within the debris fluid level, proximally 4 mm in size, mobile. No additional pelvic masses. Impression:  - Post hysterectomy with nonvisualization of ovaries - Stable appearance of complicated cystic lesion in the LEFT adnexa containing mobile debris and a small mobile nodule; this could represent a cyst or potentially a segment of hydrosalpinx. - Continued follow-up imaging recommended in 6-12 months to demonstrate continued stability  Problem List: Patient Active Problem List   Diagnosis Date Noted   Inflamed seborrheic keratosis 02/03/2023   Nocturia 12/08/2022   Hypertensive urgency 07/19/2022   Constipation 05/10/2022   Acute kidney injury superimposed on chronic kidney disease (HCC) 03/22/2022   Chronic nausea 03/03/2022   Carotid artery calcification, bilateral 12/21/2021   Osteoarthritis of cervical spine 12/21/2021   Acute on chronic diastolic CHF (congestive heart failure) (HCC) 08/17/2021   Adnexal mass 08/04/2021   Anemia of chronic  renal failure, stage 3b (HCC) 08/03/2021   GERD (gastroesophageal reflux disease) 07/26/2021   Left hip pain 04/20/2021   Moderate dementia (HCC) 11/10/2020   Sinus bradycardia 07/18/2019   Valvular heart disease 07/18/2019   PSVT (paroxysmal supraventricular tachycardia) 05/16/2019   Persistent atrial fibrillation (HCC) 07/19/2018   Peripheral edema 02/02/2018   Acute nonintractable headache 02/02/2018   Coronary artery disease, non-occlusive 09/14/2017   PAF (paroxysmal atrial fibrillation) (HCC) 09/14/2017   Status post ablation of atrial fibrillation 09/14/2017   Essential hypertension 09/14/2017   Tinea pedis 07/14/2017   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/15/2016   History of rheumatoid arthritis 03/14/2016   Mitral regurgitation 12/30/2015   Obesity 12/02/2015   Aortic valve insufficiency 12/02/2015   Vitamin D deficiency 04/17/2015   Counseling regarding end of life decision making 04/17/2015   Allergic rhinitis 02/03/2015   Acute left-sided low back pain with left-sided sciatica 08/28/2012   Aortic valve sclerosis 08/19/2010   Osteopenia 05/11/2010   SINUS BRADYCARDIA 08/19/2009   Anemia of chronic disease 12/13/2007   HYPERLIPIDEMIA 08/23/2007   UNSPECIFIED GLAUCOMA 10/10/2006    Past Medical History: Past Medical History:  Diagnosis Date   Anemia    Aortic valve sclerosis 08/19/2010   Qualifier: Diagnosis of  By: Huntley Dec, [name]     Coronary artery disease, non-occlusive    a. cath 2/09: no CAD, EF 70%   GERD (gastroesophageal reflux disease) 07/26/2021   HYPERLIPIDEMIA    HYPERTENSION    Hypertensive urgency 07/19/2022   Mild Aortic insufficiency    Moderate mitral regurgitation    Moderate tricuspid regurgitation    OSTEOPENIA    PAF (paroxysmal  atrial fibrillation) (HCC)    a. s/p ablation 2013; b. CHADS2VASc -> 4 (HTN, age x 2, female)-->Eliquis.   PAT (paroxysmal atrial tachycardia)    a. 04/2019 Zio: Occas PACs and rare PVCs. 21 atrial runs -  longest 20 beats, max rate 169.   Pneumonia 2009   RA (rheumatoid arthritis) (HCC)    Sinus Bradycardia    a. asymptomatic but prevents use of AVN blocking agents; b. 04/2019 Zio: Avg HR 61 (37-109).   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 05/2019 Echo: EF 60-65%. DD. RVSP 41.71mmHg. Mod dil LA. Mod MR/TR. Mild AI.    Past Surgical History: Past Surgical History:  Procedure Laterality Date   ABLATION OF DYSRHYTHMIC FOCUS     CARDIAC CATHETERIZATION     COLONOSCOPY  04/18/08   Partial hysterectomy--1979     Thoracentesis   12/20/07    .  Family History: Family History  Problem Relation Age of Onset   Diabetes Other    Breast cancer Paternal Aunt 73   Heart disease Mother    Pancreatic cancer Father    Atrial fibrillation Brother    Heart disease Brother    Heart attack Brother     Social History: Social History   Socioeconomic History   Marital status: Married    Spouse name: Not on file   Number of children: Not on file   Years of education: Not on file   Highest education level: Not on file  Occupational History   Not on file  Tobacco Use   Smoking status: Never   Smokeless tobacco: Never  Vaping Use   Vaping Use: Never used  Substance and Sexual Activity   Alcohol use: No    Alcohol/week: 0.0 standard drinks of alcohol   Drug use: No   Sexual activity: Not Currently    Birth control/protection: None  Other Topics Concern   Not on file  Social History Narrative   Marital Status: widow x 1 yrChildren: 5, grandchildren 107, numerous great grand 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 Full Code ( reviewed 2015) Occasional exercise.Diet: fruits and veggies, lean meats.   Right  handed    Caffeine- none    Social Determinants of Health   Financial Resource Strain: Low Risk  (04/11/2022)   Overall Financial Resource Strain (CARDIA)    Difficulty of Paying Living Expenses: Not hard at all  Food Insecurity: No Food Insecurity (01/09/2023)   Hunger Vital Sign    Worried About Running Out of Food in the Last Year: Never true    Ran Out of Food in the Last Year: Never true  Transportation Needs: No Transportation Needs (01/09/2023)   PRAPARE - Administrator, Civil Service (Medical): No    Lack of Transportation (Non-Medical): No  Physical Activity: Inactive (04/11/2022)   Exercise Vital Sign    Days of Exercise per Week: 0 days    Minutes of Exercise per Session: 0 min  Stress: No Stress Concern Present (04/11/2022)   [employer] of Occupational Health - Occupational Stress Questionnaire    Feeling of Stress : Not at all  Social Connections: Not on file  Intimate Partner Violence: Not on file    Allergies: Allergies  Allergen Reactions   Amlodipine     Ankle swelling   Celecoxib Other (  See Comments) and Rash    Tachycardia/palpitations Other reaction(s): Other Tachycardia/palpitations Tachycardia/palpitations   Tramadol Nausea Only    Current Medications: Current Outpatient Medications  Medication Sig Dispense Refill   acetaminophen (TYLENOL) 325 MG tablet Take 325 mg by mouth every 6 (six) hours as needed for mild pain.     amiodarone (PACERONE) 200 MG tablet TAKE 1/2 TABLET (100 MG TOTAL) BY MOUTH DAILY 45 tablet 3   apixaban (ELIQUIS) 2.5 MG TABS tablet Take 1 tablet (2.5 mg total) by mouth 2 (two) times daily. 180 tablet 3   D 1000 25 MCG (1000 UT) capsule TAKE 1 CAPSULE BY MOUTH AT BEDTIME. 30 capsule 3   donepezil (ARICEPT) 5 MG tablet Take 1 tablet (5 mg total) by mouth at bedtime. 30 tablet 11   doxazosin (CARDURA) 2 MG tablet Take 1 tablet (2 mg total) by mouth daily. 90 tablet 3   furosemide (LASIX) 40 MG tablet Take 0.5 tablets  (20 mg total) by mouth as needed for fluid or edema. 30 tablet 3   hydrALAZINE (APRESOLINE) 50 MG tablet Take 1 tablet (50 mg total) by mouth every 8 (eight) hours as needed. FOR BLOOD PRESSURE ABOVE 150 30 tablet 0   LUMIGAN 0.01 % SOLN SMARTSIG:In Eye(s)     ondansetron (ZOFRAN) 4 MG tablet Take 1-2 tablets (4-8 mg total) by mouth every 8 (eight) hours as needed for nausea or vomiting. 30 tablet 0   pantoprazole (PROTONIX) 40 MG tablet TAKE 1 TABLET BY MOUTH ONCE DAILY 30 tablet 11   potassium chloride SA (KLOR-CON M) 20 MEQ tablet TAKE 1 TABLET BY MOUTH DAILY. TAKE WHEN TAKING FUROSEMIDE. 30 tablet 3   predniSONE (DELTASONE) 20 MG tablet Take 1 tablet (20 mg total) by mouth daily with breakfast. 15 tablet 0   rosuvastatin (CRESTOR) 10 MG tablet Take 1 tablet (10 mg total) by mouth daily. 90 tablet 3   No current facility-administered medications for this visit.    Review of Systems General:  fatigue Skin: no complaints Eyes: no complaints HEENT: no complaints Breasts: no complaints Pulmonary: no complaints Cardiac: no complaints Gastrointestinal: no complaints Genitourinary/Sexual: no complaints Ob/Gyn: no complaints Musculoskeletal: no complaints Hematology: no complaints Neurologic/Psych: no complaints   Objective:  Physical Examination:  BP (!) 148/57   Pulse (!) 46   Temp 97.9 F (36.6 C)   Resp 19   Wt 136 lb 12.8 oz (62.1 kg)   SpO2 99%   BMI 22.08 kg/m    ECOG Performance Status: 1 - Symptomatic but completely ambulatory  GENERAL: Patient is a well appearing female in no acute distress HEENT:  Sclera clear. Anicteric NODES:  Negative axillary, supraclavicular, inguinal lymph node survery LUNGS:  Clear to auscultation bilaterally.   HEART:  Irregularly irregular ABDOMEN:  Soft, nontender. No masses or ascites EXTREMITIES:  No peripheral edema. Atraumatic. No cyanosis SKIN:  Clear with no obvious rashes or skin changes.  NEURO:  Nonfocal. Well oriented.   Appropriate affect.  Pelvic: per last visit EGBUS/Vagina: atrophic, Bimanual/RV: Hard stool.  No masses or tenderness.  Lab Review: Labs on site today:  No labs on site today  Radiologic Imaging: Per hpi    Chemistry      Component Value Date/Time   NA 138 10/19/2022 1112   NA 140 07/19/2022 1137   NA 142 08/19/2012 0208   K 3.8 10/19/2022 1112   K 4.2 08/19/2012 0208   CL 107 10/19/2022 1112   CL 109 (H) 08/19/2012 1610  CO2 25 10/19/2022 1112   CO2 24 08/19/2012 0208   BUN 28 (H) 10/19/2022 1112   BUN 27 07/19/2022 1137   BUN 27 (H) 08/19/2012 0208   CREATININE 1.97 (H) 10/19/2022 1112   CREATININE 1.34 (H) 02/02/2018 1622      Component Value Date/Time   CALCIUM 9.0 10/19/2022 1112   CALCIUM 9.0 08/19/2012 0208   ALKPHOS 53 10/19/2022 1112   ALKPHOS 73 08/19/2012 0208   AST 20 10/19/2022 1112   AST 25 08/19/2012 0208   ALT 13 10/19/2022 1112   ALT 19 08/19/2012 0208   BILITOT 0.8 10/19/2022 1112   BILITOT 0.4 08/19/2012 0208     Lab Results  Component Value Date   WBC 4.5 10/19/2022   HGB 10.3 (L) 10/19/2022   HCT 30.9 (L) 10/19/2022   MCV 92.2 10/19/2022   PLT 160 10/19/2022   CA125 10/26/22 = 19  CA125 10/22 = 16   Assessment:  [name] is a 82 y.o. female diagnosed with incidentally detected complex 5.9 cm left adnexal mass with small mural nodule on [name] for hip pain 7/22.  Low suspicion for cancer. Normal ca 125. Follow up ultrasounds were stable to smaller. Mural nodule was stable appearing. Clinically, asymptomatic.  CA125 remains normal.  [name] done today and pending.   Prior hysterectomy.   Medical co-morbidities complicating care: CKD/anemia, bradycardia, a fib w/ hx of ablation on eliquis Plan:   Problem List Items Addressed This Visit       Other   Adnexal mass - Primary    Per last visit we ordered and [name].  "Spoke to Dr. Myles Rosenthal Radiologist. He thinks there are now two small nodules in the cyst on the 1/24 [place] compared to  one nodule on past scans.  In view of this, will order pelvic [name] for better characterization. If there is a significant level of concern can consider Laparoscopic resection to rule out malignancy.  That said, the mass has not really grown significantly over the past year and a half."  Will contact the patient when [name] results are ready.  Again discussed the pros and cons of LS surgery to remove the lesion vs continued watchful waiting.    She has liver [name] scheduled Friday to evaluate 5 cm cyst.  And also has colonoscopy scheduled soon.        The patient's diagnosis, an outline of the further diagnostic and laboratory studies which will be required, the recommendation, and alternatives were discussed.  All questions were answered to the patient's satisfaction.  I discussed the assessment and treatment plan with the patient. The patient was provided an opportunity to ask questions and all were answered. The patient agreed with the plan and demonstrated an understanding of the instructions.   The patient was advised to call back or seek an in-person evaluation if the symptoms worsen or if the condition fails to improve as anticipated.   Leida Lauth, MD  CC:  Excell Seltzer, MD [address] [place],  [place] [address] [phone]

## 2023-02-17 ENCOUNTER — Ambulatory Visit
Admission: RE | Admit: 2023-02-17 | Discharge: 2023-02-17 | Disposition: A | Discharge status: Home / Self Care | Payer: Medicare Other | Source: Ambulatory Visit | Attending: Physician Assistant | Admitting: Physician Assistant

## 2023-02-17 DIAGNOSIS — N281 Cyst of kidney, acquired: Secondary | ICD-10-CM | POA: Diagnosis not present

## 2023-02-17 DIAGNOSIS — K769 Liver disease, unspecified: Secondary | ICD-10-CM

## 2023-02-17 MED ORDER — GADOBUTROL 1 MMOL/ML IV SOLN
6.0000 mL | Freq: Once | INTRAVENOUS | Status: AC | PRN
  Administered 2023-02-17: 6 mL via INTRAVENOUS

## 2023-02-21 ENCOUNTER — Telehealth: Payer: Self-pay

## 2023-02-21 NOTE — Progress Notes (Signed)
Call and notify patient abdominal [name] shows a benign liver cyst and benign kidney cysts.  No evidence of cancer.  This Is great news.  Of note, there appears to be some iron deposits in the liver, spleen, and bone marrow.  Patient does needs to stop all iron supplements.

## 2023-02-21 NOTE — Telephone Encounter (Signed)
Patient notified of the results below.   Call and notify patient abdominal [name] shows a benign liver cyst and benign kidney cysts.  No evidence of cancer.  This Is great news.  Of note, there appears to be some iron deposits in the liver, spleen, and bone marrow.  Patient does needs to stop all iron supplements.

## 2023-02-22 ENCOUNTER — Other Ambulatory Visit: Payer: Self-pay

## 2023-02-22 ENCOUNTER — Telehealth: Payer: Self-pay

## 2023-02-22 ENCOUNTER — Telehealth: Payer: Self-pay | Admitting: Pharmacist

## 2023-02-22 NOTE — Telephone Encounter (Signed)
Patient with diagnosis of afib on Eliquis for anticoagulation.    Procedure: Colonoscopy/EGD  Date of procedure: 02/28/23  CHA2DS2-VASc Score = 5   This indicates a 7.2% annual risk of stroke. The patient's score is based upon: CHF History: 0 HTN History: 1 Diabetes History: 0 Stroke History: 0 Vascular Disease History: 1 Age Score: 2 Gender Score: 1      CrCl 21 ml/min  Per office protocol, patient can hold Eliquis for 2-3 days prior to procedure.    **This guidance is not considered finalized until pre-operative APP has relayed final recommendations.**

## 2023-02-22 NOTE — Telephone Encounter (Signed)
-----   Message from Chilton Si, MD sent at 02/21/2023  8:03 PM EDT -----  ----- Message ----- From: Rayann Heman, CMA Sent: 02/09/2023   4:12 PM EDT To: Chilton Si, MD

## 2023-02-22 NOTE — Progress Notes (Unsigned)
Patient with diagnosis of *** on *** for anticoagulation.    Procedure: *** Date of procedure: ***   CHA2DS2-VASc Score =    {Confirm score is correct.  If not, click here to update score.  REFRESH note.  :1} This indicates a  % annual risk of stroke. The patient's score is based upon:    {This patient has a significant risk of stroke if diagnosed with atrial fibrillation.  Please consider VKA or DOAC agent for anticoagulation if the bleeding risk is acceptable.   You can also use the SmartPhrase .[email or web address] for documentation.   :[id number]}   CrCl *** Platelet count ***  Patient does/does not*** require pre-op antibiotics for dental procedure.  Per office protocol, patient can hold *** for *** days prior to procedure.   Patient ***will/will not need bridging with Lovenox (enoxaparin) around procedure.  **This guidance is not considered finalized until pre-operative APP has relayed final recommendations.**

## 2023-02-22 NOTE — Telephone Encounter (Signed)
   [facility] Pre-operative Risk Assessment    Request for surgical clearance:  What type of surgery is being performed? Colonoscopy/EGD   When is this surgery scheduled? 02/28/23   Are there any medications that need to be held prior to surgery and how long?Eliquis 2.5mg /2days   Practice name and name of physician performing surgery? [facility]/Dr. Lannette Donath  What is your office phone and fax number? [phone] fax   Anesthesia type (None, local, MAC, general) ? general   Jerick Khachatryan 02/22/2023, 2:22 PM  __________________      _______________________________________________   (provider comments below)

## 2023-02-22 NOTE — Telephone Encounter (Signed)
I requested additional information from [name], CMA regarding procedure details.  [name], NP-C  02/22/2023, 2:11 PM [address] Office [phone] Fax [phone]

## 2023-02-22 NOTE — Telephone Encounter (Signed)
Spoke with patient who is agreeable to do a tele visit on 5/17 at 1:40 pm. Med rec and consent done.

## 2023-02-22 NOTE — Telephone Encounter (Signed)
  Patient Consent for Virtual Visit        [name] has provided verbal consent on 02/22/2023 for a virtual visit (video or telephone).   CONSENT FOR VIRTUAL VISIT FOR:  [name]  By participating in this virtual visit I agree to the following:  I hereby voluntarily request, consent and authorize  [facility] and its employed or contracted physicians, physician assistants, nurse practitioners or other licensed health care professionals (the Practitioner), to provide me with telemedicine health care services (the "Services") as deemed necessary by the treating Practitioner. I acknowledge and consent to receive the Services by the Practitioner via telemedicine. I understand that the telemedicine visit will involve communicating with the Practitioner through live audiovisual communication technology and the disclosure of certain medical information by electronic transmission. I acknowledge that I have been given the opportunity to request an in-person assessment or other available alternative prior to the telemedicine visit and am voluntarily participating in the telemedicine visit.  I understand that I have the right to withhold or withdraw my consent to the use of telemedicine in the course of my care at any time, without affecting my right to future care or treatment, and that the Practitioner or I may terminate the telemedicine visit at any time. I understand that I have the right to inspect all information obtained and/or recorded in the course of the telemedicine visit and may receive copies of available information for a reasonable fee.  I understand that some of the potential risks of receiving the Services via telemedicine include:  Delay or interruption in medical evaluation due to technological equipment failure or disruption; Information transmitted may not be sufficient (e.g. poor resolution of images) to allow for appropriate medical decision making by the  Practitioner; and/or  In rare instances, security protocols could fail, causing a breach of personal health information.  Furthermore, I acknowledge that it is my responsibility to provide information about my medical history, conditions and care that is complete and accurate to the best of my ability. I acknowledge that Practitioner's advice, recommendations, and/or decision may be based on factors not within their control, such as incomplete or inaccurate data provided by me or distortions of diagnostic images or specimens that may result from electronic transmissions. I understand that the practice of medicine is not an exact science and that Practitioner makes no warranties or guarantees regarding treatment outcomes. I acknowledge that a copy of this consent can be made available to me via my patient portal [facility] MyChart), or I can request a printed copy by calling the office of  [facility].    I understand that my insurance will be billed for this visit.   I have read or had this consent read to me. I understand the contents of this consent, which adequately explains the benefits and risks of the Services being provided via telemedicine.  I have been provided ample opportunity to ask questions regarding this consent and the Services and have had my questions answered to my satisfaction. I give my informed consent for the services to be provided through the use of telemedicine in my medical care

## 2023-02-22 NOTE — Telephone Encounter (Signed)
Primary Cardiologist:Tiffany Duke Salvia, MD   Preoperative team, please contact this patient and set up a phone call appointment for further preoperative risk assessment. Please obtain consent and complete medication review. Thank you for your help.    Information completed by Malena Peer, RPH and copied from duplicate request that did not have all details of procedure:  Patient with diagnosis of afib on Eliquis for anticoagulation.     Procedure: Colonoscopy/EGD  Date of procedure: 02/28/23   CHA2DS2-VASc Score = 5   This indicates a 7.2% annual risk of stroke. The patient's score is based upon: CHF History: 0 HTN History: 1 Diabetes History: 0 Stroke History: 0 Vascular Disease History: 1 Age Score: 2 Gender Score: 1      CrCl 21 ml/min   Per office protocol, patient can hold Eliquis for 2-3 days prior to procedure.     **This guidance is not considered finalized until pre-operative APP has relayed final recommendations.[name], NP-C  02/22/2023, 2:35 PM [address], [address] Office [phone] Fax [phone]

## 2023-02-23 NOTE — Telephone Encounter (Signed)
   Pre-operative Risk Assessment    Patient Name: [name]  DOB: 1941/03/18 MRN: [id number]    PER NOTES IN EPIC FOUND PROCEDURE TO BE DONE  Request for Surgical Clearance    Procedure:   COLONOSCOPY/EGD  Date of Surgery:  Clearance 02/28/23                                 Surgeon:  DR. VANGA Surgeon's Group or Practice Name:   Phone number:  [phone] Fax number:  [phone]   Type of Clearance Requested:   - Medical  - Pharmacy:  Hold Apixaban (Eliquis)     Type of Anesthesia:   PROPOFOL   Additional requests/questions:    Elpidio Anis   02/23/2023, 11:59 AM

## 2023-02-24 ENCOUNTER — Ambulatory Visit: Payer: Medicare Other | Attending: Internal Medicine | Admitting: Nurse Practitioner

## 2023-02-24 DIAGNOSIS — Z01810 Encounter for preprocedural cardiovascular examination: Secondary | ICD-10-CM | POA: Diagnosis not present

## 2023-02-24 NOTE — Progress Notes (Signed)
Virtual Visit via Telephone Note   Because of [name]'s co-morbid illnesses, she is at least at moderate risk for complications without adequate follow up.  This format is felt to be most appropriate for this patient at this time.  The patient did not have access to video technology/had technical difficulties with video requiring transitioning to audio format only (telephone).  All issues noted in this document were discussed and addressed.  No physical exam could be performed with this format.  Please refer to the patient's chart for her consent to telehealth for [facility]  Evaluation Performed:  Preoperative cardiovascular risk assessment _____________   Date:  02/24/2023   Patient ID:  [name], DOB 16-Nov-1940, MRN [id number] Patient Location:  Home Provider location:   Office  Primary Care Provider:  Excell Seltzer, MD Primary Cardiologist:  Chilton Si, MD  Chief Complaint / Patient Profile   82 y.o. y/o female with a h/o persistent atrial fibrillation s/p ablation, bradycardia, CKD, mitral regurgitation, aortic regurgitation, HTN, HLD, chronic HFpEF, chronic LE edema who is pending EGD/colonoscopy and presents today for telephonic preoperative cardiovascular risk assessment.  History of Present Illness    [name] is a 82 y.o. female who presents via audio/video conferencing for a telehealth visit today.  Pt was last seen in cardiology clinic on 07/19/22 by Dr. Duke Salvia.  At that time [name] was undergoing adjustments to antihypertensive therapy. She did not keep the follow-up appointment. The patient is now pending procedure as outlined above. Since her last visit, she reports BP has been stable. No interim ER or urgent care visits.  She reports she has maintained consistent follow-up with nephrology and there have been no concerns. Reports chronic stable bilateral LE edema for which she wears compression stockings. She denies  chest pain, shortness of breath, fatigue, palpitations, melena, hematuria, hemoptysis, diaphoresis, weakness, presyncope, syncope, orthopnea, and PND.  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 cath 2/09: no CAD, EF 70%   GERD (gastroesophageal reflux disease) 07/26/2021   HYPERLIPIDEMIA    HYPERTENSION    Hypertensive urgency 07/19/2022   Mild Aortic insufficiency    Moderate mitral regurgitation    Moderate tricuspid regurgitation    OSTEOPENIA    PAF (paroxysmal atrial fibrillation) (HCC)    a. s/p ablation 2013; b. CHADS2VASc -> 4 (HTN, age x 2, female)-->Eliquis.   PAT (paroxysmal atrial tachycardia)    a. 04/2019 Zio: Occas PACs and rare PVCs. 21 atrial runs - longest 20 beats, max rate 169.   Pneumonia 2009   RA (rheumatoid arthritis) (HCC)    Sinus Bradycardia    a. asymptomatic but prevents use of AVN blocking agents; b. 04/2019 Zio: Avg HR 61 (37-109).   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 05/2019 Echo: EF 60-65%. DD. RVSP 41.88mmHg. Mod dil LA. Mod MR/TR. Mild AI.   Past Surgical History:  Procedure Laterality Date   ABLATION OF DYSRHYTHMIC FOCUS     CARDIAC CATHETERIZATION     COLONOSCOPY  04/18/08   Partial hysterectomy--1979     Thoracentesis   12/20/07      Allergies  Allergies  Allergen Reactions  Amlodipine     Ankle swelling   Celecoxib Other (See Comments) and Rash    Tachycardia/palpitations Other reaction(s): Other Tachycardia/palpitations Tachycardia/palpitations   Tramadol Nausea Only    Home Medications    Prior to Admission medications   Medication Sig Start Date End Date Taking? Authorizing Provider  acetaminophen (TYLENOL) 325 MG tablet Take 325 mg by mouth every 6 (six)  hours as needed for mild pain.    [provider]  amiodarone (PACERONE) 200 MG tablet TAKE 1/2 TABLET (100 MG TOTAL) BY MOUTH DAILY Patient not taking: Reported on 02/22/2023 07/19/22   Chilton Si, MD  apixaban (ELIQUIS) 2.5 MG TABS tablet Take 1 tablet (2.5 mg total) by mouth 2 (two) times daily. 07/19/22   Chilton Si, MD  D 1000 25 MCG (1000 UT) capsule TAKE 1 CAPSULE BY MOUTH AT BEDTIME. 10/01/21   Chilton Si, MD  donepezil (ARICEPT) 5 MG tablet Take 1 tablet (5 mg total) by mouth at bedtime. 12/08/22   Bedsole, Amy E, MD  doxazosin (CARDURA) 2 MG tablet Take 1 tablet (2 mg total) by mouth daily. 08/02/22   Chilton Si, MD  furosemide (LASIX) 40 MG tablet Take 0.5 tablets (20 mg total) by mouth as needed for fluid or edema. Patient not taking: Reported on 02/22/2023 07/19/22   Chilton Si, MD  hydrALAZINE (APRESOLINE) 50 MG tablet Take 1 tablet (50 mg total) by mouth every 8 (eight) hours as needed. FOR BLOOD PRESSURE ABOVE 150 07/19/22   Chilton Si, MD  LUMIGAN 0.01 % SOLN SMARTSIG:In Eye(s) 04/01/22   [provider]  ondansetron (ZOFRAN) 4 MG tablet Take 1-2 tablets (4-8 mg total) by mouth every 8 (eight) hours as needed for nausea or vomiting. 01/12/23   Bedsole, Amy E, MD  pantoprazole (PROTONIX) 40 MG tablet TAKE 1 TABLET BY MOUTH ONCE DAILY 06/22/22   Chilton Si, MD  potassium chloride SA (KLOR-CON M) 20 MEQ tablet TAKE 1 TABLET BY MOUTH DAILY. TAKE WHEN TAKING FUROSEMIDE. Patient not taking: Reported on 02/22/2023 07/19/22   Excell Seltzer, MD  predniSONE (DELTASONE) 20 MG tablet Take 1 tablet (20 mg total) by mouth daily with breakfast. 01/12/23   Bedsole, Amy E, MD  rosuvastatin (CRESTOR) 10 MG tablet Take 1 tablet (10 mg total) by mouth daily. 07/19/22   Chilton Si, MD    Physical Exam    Vital Signs:  [name] does not have vital signs available for review today.  Given telephonic nature of communication,  physical exam is limited. AAOx3. NAD. Normal affect.  Speech and respirations are unlabored.  Accessory Clinical Findings    None  Assessment & Plan    1.  Preoperative Cardiovascular Risk Assessment: .According to the Revised Cardiac Risk Index (RCRI), her Perioperative Risk of Major Cardiac Event is (%): 0.9. Her Functional Capacity in METs is: 6.61 according to the Duke Activity Status Index (DASI). The patient is doing well from a cardiac perspective. Therefore, based on ACC/AHA guidelines, the patient would be at acceptable risk for the planned procedure without further cardiovascular testing.   The patient was advised that if she develops new symptoms prior to surgery to contact our office to arrange for a follow-up visit, and she verbalized understanding.  Per office protocol, patient can hold Eliquis for 2 days prior to procedure.   A copy of this note will be routed to requesting surgeon.  Time:   Today, I have spent 10 minutes with the patient with telehealth technology discussing medical  history, symptoms, and management plan.    [name], NP-C  02/24/2023, 1:41 PM [address] Office [phone] Fax [phone]

## 2023-02-27 ENCOUNTER — Telehealth: Payer: Self-pay

## 2023-02-27 NOTE — Telephone Encounter (Signed)
Patient notifed on Friday to hold Eliquis 2 days prior to Colonoscopy.

## 2023-02-28 ENCOUNTER — Ambulatory Visit: Payer: Medicare Other | Admitting: Anesthesiology

## 2023-02-28 ENCOUNTER — Ambulatory Visit
Admission: RE | Admit: 2023-02-28 | Discharge: 2023-02-28 | Disposition: A | Discharge status: Home / Self Care | Payer: Medicare Other | Attending: Gastroenterology | Admitting: Gastroenterology

## 2023-02-28 ENCOUNTER — Encounter
Admission: RE | Disposition: A | Discharge status: Home / Self Care | Payer: Self-pay | Source: Home / Self Care | Attending: Gastroenterology

## 2023-02-28 ENCOUNTER — Encounter: Payer: Self-pay | Admitting: Gastroenterology

## 2023-02-28 DIAGNOSIS — I082 Rheumatic disorders of both aortic and tricuspid valves: Secondary | ICD-10-CM | POA: Diagnosis not present

## 2023-02-28 DIAGNOSIS — I509 Heart failure, unspecified: Secondary | ICD-10-CM | POA: Diagnosis not present

## 2023-02-28 DIAGNOSIS — M8580 Other specified disorders of bone density and structure, unspecified site: Secondary | ICD-10-CM | POA: Diagnosis not present

## 2023-02-28 DIAGNOSIS — K317 Polyp of stomach and duodenum: Secondary | ICD-10-CM | POA: Diagnosis not present

## 2023-02-28 DIAGNOSIS — R110 Nausea: Secondary | ICD-10-CM | POA: Diagnosis not present

## 2023-02-28 DIAGNOSIS — K5730 Diverticulosis of large intestine without perforation or abscess without bleeding: Secondary | ICD-10-CM | POA: Diagnosis not present

## 2023-02-28 DIAGNOSIS — R1013 Epigastric pain: Secondary | ICD-10-CM | POA: Diagnosis not present

## 2023-02-28 DIAGNOSIS — D509 Iron deficiency anemia, unspecified: Secondary | ICD-10-CM | POA: Diagnosis not present

## 2023-02-28 DIAGNOSIS — D123 Benign neoplasm of transverse colon: Secondary | ICD-10-CM | POA: Diagnosis not present

## 2023-02-28 DIAGNOSIS — I110 Hypertensive heart disease with heart failure: Secondary | ICD-10-CM | POA: Diagnosis not present

## 2023-02-28 DIAGNOSIS — I2510 Atherosclerotic heart disease of native coronary artery without angina pectoris: Secondary | ICD-10-CM | POA: Insufficient documentation

## 2023-02-28 DIAGNOSIS — M069 Rheumatoid arthritis, unspecified: Secondary | ICD-10-CM | POA: Diagnosis not present

## 2023-02-28 DIAGNOSIS — K219 Gastro-esophageal reflux disease without esophagitis: Secondary | ICD-10-CM | POA: Insufficient documentation

## 2023-02-28 DIAGNOSIS — E785 Hyperlipidemia, unspecified: Secondary | ICD-10-CM | POA: Diagnosis not present

## 2023-02-28 DIAGNOSIS — I480 Paroxysmal atrial fibrillation: Secondary | ICD-10-CM | POA: Insufficient documentation

## 2023-02-28 DIAGNOSIS — D126 Benign neoplasm of colon, unspecified: Secondary | ICD-10-CM | POA: Diagnosis not present

## 2023-02-28 DIAGNOSIS — Z1211 Encounter for screening for malignant neoplasm of colon: Secondary | ICD-10-CM

## 2023-02-28 DIAGNOSIS — I5033 Acute on chronic diastolic (congestive) heart failure: Secondary | ICD-10-CM | POA: Diagnosis not present

## 2023-02-28 DIAGNOSIS — K635 Polyp of colon: Secondary | ICD-10-CM | POA: Diagnosis not present

## 2023-02-28 DIAGNOSIS — D638 Anemia in other chronic diseases classified elsewhere: Secondary | ICD-10-CM

## 2023-02-28 HISTORY — PX: COLONOSCOPY WITH PROPOFOL: SHX5780

## 2023-02-28 HISTORY — PX: ESOPHAGOGASTRODUODENOSCOPY (EGD) WITH PROPOFOL: SHX5813

## 2023-02-28 SURGERY — COLONOSCOPY WITH PROPOFOL
Anesthesia: General

## 2023-02-28 MED ORDER — SODIUM CHLORIDE 0.9 % IV SOLN
INTRAVENOUS | Status: DC
  Administered 2023-02-28: 1000 mL via INTRAVENOUS

## 2023-02-28 MED ORDER — PROPOFOL 10 MG/ML IV BOLUS
INTRAVENOUS | Status: DC | PRN
  Administered 2023-02-28: 50 mg via INTRAVENOUS
  Administered 2023-02-28: 80 mg via INTRAVENOUS
  Administered 2023-02-28: 30 mg via INTRAVENOUS
  Administered 2023-02-28 (×2): 20 mg via INTRAVENOUS
  Administered 2023-02-28: 30 mg via INTRAVENOUS

## 2023-02-28 MED ORDER — GLYCOPYRROLATE 0.2 MG/ML IJ SOLN
INTRAMUSCULAR | Status: DC | PRN
  Administered 2023-02-28: .2 mg via INTRAVENOUS

## 2023-02-28 MED ORDER — STERILE WATER FOR IRRIGATION IR SOLN
Status: DC | PRN
  Administered 2023-02-28: 100 mL

## 2023-02-28 MED ORDER — PROPOFOL 500 MG/50ML IV EMUL
INTRAVENOUS | Status: DC | PRN
  Administered 2023-02-28: 50 ug/kg/min via INTRAVENOUS

## 2023-02-28 MED ORDER — GLYCOPYRROLATE 0.2 MG/ML IJ SOLN
INTRAMUSCULAR | Status: AC
  Filled 2023-02-28: qty 1

## 2023-02-28 MED ORDER — LIDOCAINE HCL (PF) 2 % IJ SOLN
INTRAMUSCULAR | Status: AC
  Filled 2023-02-28: qty 5

## 2023-02-28 MED ORDER — LIDOCAINE HCL (CARDIAC) PF 100 MG/5ML IV SOSY
PREFILLED_SYRINGE | INTRAVENOUS | Status: DC | PRN
  Administered 2023-02-28: 40 mg via INTRAVENOUS

## 2023-02-28 MED ORDER — PROPOFOL 10 MG/ML IV BOLUS
INTRAVENOUS | Status: AC
  Filled 2023-02-28: qty 20

## 2023-02-28 NOTE — Op Note (Signed)
[facility] Patient Name: [name] Procedure Date: 02/28/2023 11:42 AM MRN: [id number] Account #: [id number] Date of Birth: 1941-04-27 Admit Type: Outpatient Age: 82 Room: [facility] ENDO ROOM 3 Gender: Female Note Status: Finalized Instrument Name: Upper Endoscope [id number] Procedure:             Upper GI endoscopy Indications:           Epigastric abdominal pain, Unexplained iron deficiency                         anemia, Nausea Providers:             Toney Reil MD, MD Referring MD:          Toney Reil MD, MD (Referring MD) Medicines:             General Anesthesia Complications:         No immediate complications. Estimated blood loss: None. Procedure:             Pre-Anesthesia Assessment:                        - Prior to the procedure, a History and Physical was                         performed, and patient medications and allergies were                         reviewed. The patient is competent. The risks and                         benefits of the procedure and the sedation options and                         risks were discussed with the patient. All questions                         were answered and informed consent was obtained.                         Patient identification and proposed procedure were                         verified by the physician, the nurse, the                         anesthesiologist, the anesthetist and the technician                         in the pre-procedure area in the procedure room in the                         endoscopy suite. Mental Status Examination: alert and                         oriented. Airway Examination: normal oropharyngeal                         airway and neck mobility. Respiratory Examination:  clear to auscultation. CV Examination: normal.                         Prophylactic Antibiotics: The patient does not require                          prophylactic antibiotics. Prior Anticoagulants: The                         patient has taken Eliquis (apixaban), last dose was 2                         days prior to procedure. ASA Grade Assessment: III - A                         patient with severe systemic disease. After reviewing                         the risks and benefits, the patient was deemed in                         satisfactory condition to undergo the procedure. The                         anesthesia plan was to use general anesthesia.                         Immediately prior to administration of medications,                         the patient was re-assessed for adequacy to receive                         sedatives. The heart rate, respiratory rate, oxygen                         saturations, blood pressure, adequacy of pulmonary                         ventilation, and response to care were monitored                         throughout the procedure. The physical status of the                         patient was re-assessed after the procedure.                        After obtaining informed consent, the endoscope was                         passed under direct vision. Throughout the procedure,                         the patient's blood pressure, pulse, and oxygen                         saturations were monitored continuously. The Endoscope  was introduced through the mouth, and advanced to the                         second part of duodenum. The upper GI endoscopy was                         accomplished without difficulty. The patient tolerated                         the procedure well. Findings:      A single 10 mm sessile polyp with no bleeding was found in the second       portion of the duodenum. Biopsies were taken with a cold forceps for       histology.      The entire examined stomach was normal. Biopsies were taken with a cold       forceps for Helicobacter pylori testing.      The  cardia and gastric fundus were normal on retroflexion.      The gastroesophageal junction and examined esophagus were normal. Impression:            - A single duodenal polyp. Biopsied.                        - Normal stomach. Biopsied.                        - Normal gastroesophageal junction and esophagus. Recommendation:        - Await pathology results.                        - Proceed with colonoscopy as scheduled                        See colonoscopy report Procedure Code(s):     --- Professional ---                        [phone], Esophagogastroduodenoscopy, flexible,                         transoral; with biopsy, single or multiple Diagnosis Code(s):     --- Professional ---                        K31.7, Polyp of stomach and duodenum                        D50.9, Iron deficiency anemia, unspecified                        R10.13, Epigastric pain                        R11.0, Nausea CPT copyright 2022 American Medical Association. All rights reserved. The codes documented in this report are preliminary and upon coder review may  be revised to meet current compliance requirements. Dr. Libby Maw Toney Reil MD, MD 02/28/2023 12:04:52 PM This report has been signed electronically. Number of Addenda: 0 Note Initiated On: 02/28/2023 11:42 AM Estimated Blood Loss:  Estimated blood loss: none.      [facility]

## 2023-02-28 NOTE — Transfer of Care (Signed)
Immediate Anesthesia Transfer of Care Note  Patient: [name]  Procedure(s) Performed: COLONOSCOPY WITH PROPOFOL ESOPHAGOGASTRODUODENOSCOPY (EGD) WITH PROPOFOL  Patient Location: Endoscopy Unit  Anesthesia Type:General  Level of Consciousness: drowsy and patient cooperative  Airway & Oxygen Therapy: Patient Spontanous Breathing and Patient connected to face mask oxygen  Post-op Assessment: Report given to RN and Patient moving all extremities X 4  Post vital signs: Reviewed and stable  Last Vitals:  Vitals Value Taken Time  BP 150/65 02/28/23 1230  Temp 35.6 C 02/28/23 1229  Pulse 55 02/28/23 1231  Resp 21 02/28/23 1231  SpO2 98 % 02/28/23 1231  Vitals shown include unvalidated device data.  Last Pain:  Vitals:   02/28/23 1229  TempSrc: Temporal  PainSc:          Complications: No notable events documented.

## 2023-02-28 NOTE — Anesthesia Preprocedure Evaluation (Signed)
Anesthesia Evaluation  Patient identified by MRN, date of birth, ID band Patient awake    Reviewed: Allergy & Precautions, NPO status , Patient's Chart, lab work & pertinent test results  Airway Mallampati: III  TM Distance: <3 FB Neck ROM: full    Dental  (+) Chipped, Poor Dentition   Pulmonary neg pulmonary ROS, neg shortness of breath   Pulmonary exam normal        Cardiovascular Exercise Tolerance: Good hypertension, + CAD and +CHF  Normal cardiovascular exam+ Valvular Problems/Murmurs AI      Neuro/Psych  Headaches PSYCHIATRIC DISORDERS       Neuromuscular disease    GI/Hepatic Neg liver ROS,GERD  ,,  Endo/Other  negative endocrine ROS    Renal/GU Renal disease  negative genitourinary   Musculoskeletal   Abdominal   Peds  Hematology negative hematology ROS (+)   Anesthesia Other Findings Past Medical History: No date: Anemia 08/19/2010: Aortic valve sclerosis     Comment:  Qualifier: Diagnosis of  By: Huntley Dec, [name]   No date: Coronary artery disease, non-occlusive     Comment:  a. cath 2/09: no CAD, EF 70% 07/26/2021: GERD (gastroesophageal reflux disease) No date: HYPERLIPIDEMIA No date: HYPERTENSION 07/19/2022: Hypertensive urgency No date: Mild Aortic insufficiency No date: Moderate mitral regurgitation No date: Moderate tricuspid regurgitation No date: OSTEOPENIA No date: PAF (paroxysmal atrial fibrillation) (HCC)     Comment:  a. s/p ablation 2013; b. CHADS2VASc -> 4 (HTN, age x 2,               female)-->Eliquis. No date: PAT (paroxysmal atrial tachycardia)     Comment:  a. 04/2019 Zio: Occas PACs and rare PVCs. 21 atrial runs               - longest 20 beats, max rate 169. 2009: Pneumonia No date: RA (rheumatoid arthritis) (HCC) No date: Sinus Bradycardia     Comment:  a. asymptomatic but prevents use of AVN blocking agents;              b. 04/2019 Zio: Avg HR 61 (37-109). No date:  Unspecified glaucoma(365.9) No date: Valvular heart disease     Comment:  a. 05/2019 Echo: EF 60-65%. DD. RVSP 41.23mmHg. Mod dil               LA. Mod MR/TR. Mild AI.  Past Surgical History: No date: ABLATION OF DYSRHYTHMIC FOCUS No date: CARDIAC CATHETERIZATION 04/18/08: COLONOSCOPY No date: Partial hysterectomy--1979 No date: Thoracentesis   12/20/07  BMI    Body Mass Index: 21.71 kg/m      Reproductive/Obstetrics negative OB ROS                             Anesthesia Physical Anesthesia Plan  ASA: 3  Anesthesia Plan: General   Post-op Pain Management:    Induction: Intravenous  PONV Risk Score and Plan: Propofol infusion and TIVA  Airway Management Planned: Natural Airway and Nasal Cannula  Additional Equipment:   Intra-op Plan:   Post-operative Plan:   Informed Consent: I have reviewed the patients History and Physical, chart, labs and discussed the procedure including the risks, benefits and alternatives for the proposed anesthesia with the patient or authorized representative who has indicated his/her understanding and acceptance.     Dental Advisory Given  Plan Discussed with: Anesthesiologist, CRNA and Surgeon  Anesthesia Plan Comments: (Patient consented for risks of anesthesia including but not limited  to:  - adverse reactions to medications - risk of airway placement if required - damage to eyes, teeth, lips or other oral mucosa - nerve damage due to positioning  - sore throat or hoarseness - Damage to heart, brain, nerves, lungs, other parts of body or loss of life  Patient voiced understanding.)       Anesthesia Quick Evaluation

## 2023-02-28 NOTE — Op Note (Signed)
[facility] Patient Name: [name] Procedure Date: 02/28/2023 11:43 AM MRN: [id number] Account #: [id number] Date of Birth: 06/20/1941 Admit Type: Outpatient Age: 82 Room: [facility] ENDO ROOM 3 Gender: Female Note Status: Finalized Instrument Name: Peds Colonoscope [id number] Procedure:             Colonoscopy Indications:           Screening for colorectal malignant neoplasm, Last                         colonoscopy 10 years ago Providers:             Toney Reil MD, MD Referring MD:          Toney Reil MD, MD (Referring MD) Medicines:             General Anesthesia Complications:         No immediate complications. Estimated blood loss: None. Procedure:             Pre-Anesthesia Assessment:                        - Prior to the procedure, a History and Physical was                         performed, and patient medications and allergies were                         reviewed. The patient is competent. The risks and                         benefits of the procedure and the sedation options and                         risks were discussed with the patient. All questions                         were answered and informed consent was obtained.                         Patient identification and proposed procedure were                         verified by the physician, the nurse, the                         anesthesiologist, the anesthetist and the technician                         in the pre-procedure area in the procedure room in the                         endoscopy suite. Mental Status Examination: alert and                         oriented. Airway Examination: normal oropharyngeal                         airway and neck mobility. Respiratory Examination:  clear to auscultation. CV Examination: normal.                         Prophylactic Antibiotics: The patient does not require                          prophylactic antibiotics. Prior Anticoagulants: The                         patient has taken Eliquis (apixaban), last dose was 2                         days prior to procedure. ASA Grade Assessment: III - A                         patient with severe systemic disease. After reviewing                         the risks and benefits, the patient was deemed in                         satisfactory condition to undergo the procedure. The                         anesthesia plan was to use general anesthesia.                         Immediately prior to administration of medications,                         the patient was re-assessed for adequacy to receive                         sedatives. The heart rate, respiratory rate, oxygen                         saturations, blood pressure, adequacy of pulmonary                         ventilation, and response to care were monitored                         throughout the procedure. The physical status of the                         patient was re-assessed after the procedure.                        After obtaining informed consent, the colonoscope was                         passed under direct vision. Throughout the procedure,                         the patient's blood pressure, pulse, and oxygen                         saturations were monitored continuously. The  Colonoscope was introduced through the anus and                         advanced to the the terminal ileum, with                         identification of the appendiceal orifice and IC                         valve. The colonoscopy was performed without                         difficulty. The patient tolerated the procedure well.                         The quality of the bowel preparation was evaluated                         using the BBPS [facility] Bowel Preparation Scale) with                         scores of: Right Colon = 3, Transverse Colon = 3 and                          Left Colon = 3 (entire mucosa seen well with no                         residual staining, small fragments of stool or opaque                         liquid). The total BBPS score equals 9. The terminal                         ileum, ileocecal valve, appendiceal orifice, and                         rectum were photographed. Findings:      The perianal and digital rectal examinations were normal. Pertinent       negatives include normal sphincter tone and no palpable rectal lesions.      The terminal ileum appeared normal.      A 9 mm polyp was found in the transverse colon. The polyp was sessile.       The polyp was removed with a cold snare. Resection and retrieval were       complete. Estimated blood loss was minimal.      A diminutive polyp was found in the transverse colon. The polyp was       sessile. The polyp was removed with a cold biopsy forceps. Resection and       retrieval were complete.      The retroflexed view of the distal rectum and anal verge was normal and       showed no anal or rectal abnormalities. Impression:            - The examined portion of the ileum was normal.                        - One 9 mm polyp in  the transverse colon, removed with                         a cold snare. Resected and retrieved.                        - One diminutive polyp in the transverse colon,                         removed with a cold biopsy forceps. Resected and                         retrieved.                        - The distal rectum and anal verge are normal on                         retroflexion view. Recommendation:        - Discharge patient to home (with escort).                        - Resume previous diet today.                        - Continue present medications.                        - Await pathology results.                        - Repeat colonoscopy in 5 years for surveillance based                         on pathology results. Procedure  Code(s):     --- Professional ---                        [phone], Colonoscopy, flexible; with removal of                         tumor(s), polyp(s), or other lesion(s) by snare                         technique                        45380, 59, Colonoscopy, flexible; with biopsy, single                         or multiple Diagnosis Code(s):     --- Professional ---                        Z12.11, Encounter for screening for malignant neoplasm                         of colon                        D12.3, Benign neoplasm of transverse colon (hepatic  flexure or splenic flexure) CPT copyright 2022 American Medical Association. All rights reserved. The codes documented in this report are preliminary and upon coder review may  be revised to meet current compliance requirements. Dr. Libby Maw Toney Reil MD, MD 02/28/2023 12:27:25 PM This report has been signed electronically. Number of Addenda: 0 Note Initiated On: 02/28/2023 11:43 AM Scope Withdrawal Time: 0 hours 11 minutes 27 seconds  Total Procedure Duration: 0 hours 18 minutes 32 seconds  Estimated Blood Loss:  Estimated blood loss: none.      [facility]

## 2023-02-28 NOTE — H&P (Signed)
Arlyss Repress, MD [address]  [address]  [place], [place] [address]  Main: [phone]  Fax: [phone] Pager: [phone]  Primary Care Physician:  Excell Seltzer, MD Primary Gastroenterologist:  Dr. Arlyss Repress  Pre-Procedure History & Physical: HPI:  [name] is a 82 y.o. female is here for an endoscopy and colonoscopy.   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 cath 2/09: no CAD, EF 70%   GERD (gastroesophageal reflux disease) 07/26/2021   HYPERLIPIDEMIA    HYPERTENSION    Hypertensive urgency 07/19/2022   Mild Aortic insufficiency    Moderate mitral regurgitation    Moderate tricuspid regurgitation    OSTEOPENIA    PAF (paroxysmal atrial fibrillation) (HCC)    a. s/p ablation 2013; b. CHADS2VASc -> 4 (HTN, age x 2, female)-->Eliquis.   PAT (paroxysmal atrial tachycardia)    a. 04/2019 Zio: Occas PACs and rare PVCs. 21 atrial runs - longest 20 beats, max rate 169.   Pneumonia 2009   RA (rheumatoid arthritis) (HCC)    Sinus Bradycardia    a. asymptomatic but prevents use of AVN blocking agents; b. 04/2019 Zio: Avg HR 61 (37-109).   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 05/2019 Echo: EF 60-65%. DD. RVSP 41.54mmHg. Mod dil LA. Mod MR/TR. Mild AI.    Past Surgical History:  Procedure Laterality Date   ABLATION OF DYSRHYTHMIC FOCUS     CARDIAC CATHETERIZATION     COLONOSCOPY  04/18/08   Partial hysterectomy--1979     Thoracentesis   12/20/07      Prior to Admission medications   Medication Sig Start Date End Date Taking? Authorizing Provider  D 1000 25 MCG (1000 UT) capsule TAKE 1 CAPSULE BY MOUTH AT BEDTIME. 10/01/21  Yes Chilton Si, MD  donepezil (ARICEPT) 5 MG tablet Take 1 tablet (5 mg total) by mouth at bedtime. 12/08/22  Yes Bedsole, Amy E, MD  hydrALAZINE (APRESOLINE) 50  MG tablet Take 1 tablet (50 mg total) by mouth every 8 (eight) hours as needed. FOR BLOOD PRESSURE ABOVE 150 07/19/22  Yes Chilton Si, MD  LUMIGAN 0.01 % SOLN SMARTSIG:In Eye(s) 04/01/22  Yes [provider]  pantoprazole (PROTONIX) 40 MG tablet TAKE 1 TABLET BY MOUTH ONCE DAILY 06/22/22  Yes Chilton Si, MD  rosuvastatin (CRESTOR) 10 MG tablet Take 1 tablet (10 mg total) by mouth daily. 07/19/22  Yes Chilton Si, MD  acetaminophen (TYLENOL) 325 MG tablet Take 325 mg by mouth every 6 (six) hours as needed for mild pain.    [provider]  amiodarone (PACERONE) 200 MG tablet TAKE 1/2 TABLET (100 MG TOTAL) BY MOUTH DAILY Patient not taking: Reported on 02/22/2023 07/19/22   Chilton Si, MD  apixaban (ELIQUIS) 2.5 MG TABS tablet Take 1 tablet (2.5 mg total) by mouth 2 (two) times daily. 07/19/22   Chilton Si, MD  doxazosin (CARDURA) 2 MG tablet Take 1 tablet (2 mg total) by mouth daily. 08/02/22   Chilton Si, MD  furosemide (LASIX) 40 MG tablet Take 0.5 tablets (20 mg total) by mouth as needed for fluid or edema. Patient not taking: Reported on 02/22/2023 07/19/22   Chilton Si, MD  ondansetron (ZOFRAN) 4 MG tablet Take 1-2 tablets (4-8 mg total) by mouth every 8 (eight) hours  as needed for nausea or vomiting. 01/12/23   Bedsole, Amy E, MD  potassium chloride SA (KLOR-CON M) 20 MEQ tablet TAKE 1 TABLET BY MOUTH DAILY. TAKE WHEN TAKING FUROSEMIDE. Patient not taking: Reported on 02/22/2023 07/19/22   Excell Seltzer, MD  predniSONE (DELTASONE) 20 MG tablet Take 1 tablet (20 mg total) by mouth daily with breakfast. Patient not taking: Reported on 02/28/2023 01/12/23   Excell Seltzer, MD    Allergies as of 02/10/2023 - Review Complete 02/09/2023  Allergen Reaction Noted   Amlodipine  04/05/2019   Celecoxib Other (See Comments) and Rash 02/02/2013   Tramadol Nausea Only 09/11/2013    Family History  Problem Relation Age of Onset   Diabetes  Other    Breast cancer Paternal Aunt 46   Heart disease Mother    Pancreatic cancer Father    Atrial fibrillation Brother    Heart disease Brother    Heart attack Brother     Social History   Socioeconomic History   Marital status: Married    Spouse name: Not on file   Number of children: Not on file   Years of education: Not on file   Highest education level: Not on file  Occupational History   Not on file  Tobacco Use   Smoking status: Never   Smokeless tobacco: Never  Vaping Use   Vaping Use: Never used  Substance and Sexual Activity   Alcohol use: No    Alcohol/week: 0.0 standard drinks of alcohol   Drug use: No   Sexual activity: Not Currently    Birth control/protection: None  Other Topics Concern   Not on file  Social History Narrative   Marital Status: widow x 1 yrChildren: 5, grandchildren 53, numerous great grand 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 Full Code ( reviewed 2015) Occasional exercise.Diet: fruits and veggies, lean meats.   Right handed    Caffeine- none    Social Determinants of Health   Financial Resource Strain: Low Risk  (04/11/2022)   Overall Financial Resource Strain (CARDIA)    Difficulty of Paying Living Expenses: Not hard at all  Food Insecurity: No Food Insecurity (01/09/2023)   Hunger Vital Sign    Worried About Running Out of Food in the Last Year: Never true    Ran Out of Food in the Last Year: Never true  Transportation Needs: No Transportation Needs (01/09/2023)   PRAPARE - Administrator, Civil Service (Medical): No    Lack of Transportation (Non-Medical): No  Physical Activity: Inactive (04/11/2022)   Exercise Vital Sign    Days of Exercise per Week: 0 days     Minutes of Exercise per Session: 0 min  Stress: No Stress Concern Present (04/11/2022)   [employer] of Occupational Health - Occupational Stress Questionnaire    Feeling of Stress : Not at all  Social Connections: Not on file  Intimate Partner Violence: Not on file    Review of Systems: See HPI, otherwise negative ROS  Physical Exam: BP (!) 180/69   Pulse (!) 53   Temp (!) 96.6 F (35.9 C) (Temporal)   Ht 5\' 6"  (1.676 m)   Wt 61 kg   SpO2 100%   BMI 21.71 kg/m  General:   Alert,  pleasant and cooperative in NAD Head:  Normocephalic and atraumatic. Neck:  Supple; no masses or thyromegaly. Lungs:  Clear throughout to auscultation.    Heart:  Regular rate and rhythm. Abdomen:  Soft, nontender and nondistended. Normal bowel sounds, without guarding, and without rebound.   Neurologic:  Alert and  oriented x4;  grossly normal neurologically.  Impression/Plan: ARDELL FARNWORTH is here for an endoscopy and colonoscopy to be performed for chronic nausea, colon cancer screening  Risks, benefits, limitations, and alternatives regarding  endoscopy and colonoscopy have been reviewed with the patient.  Questions have been answered.  All parties agreeable.   Lannette Donath, MD  02/28/2023, 11:13 AM

## 2023-02-28 NOTE — Anesthesia Postprocedure Evaluation (Signed)
Anesthesia Post Note  Patient: [name]  Procedure(s) Performed: COLONOSCOPY WITH PROPOFOL ESOPHAGOGASTRODUODENOSCOPY (EGD) WITH PROPOFOL  Patient location during evaluation: Endoscopy Anesthesia Type: General Level of consciousness: awake and alert Pain management: pain level controlled Vital Signs Assessment: post-procedure vital signs reviewed and stable Respiratory status: spontaneous breathing, nonlabored ventilation, respiratory function stable and patient connected to nasal cannula oxygen Cardiovascular status: blood pressure returned to baseline and stable Postop Assessment: no apparent nausea or vomiting Anesthetic complications: no   No notable events documented.   Last Vitals:  Vitals:   02/28/23 1109 02/28/23 1229  BP: (!) 180/69 (!) 150/65  Pulse: (!) 53 (!) 51  Resp:  15  Temp: (!) 35.9 C (!) 35.6 C  SpO2: 100% 100%    Last Pain:  Vitals:   02/28/23 1229  TempSrc: Temporal  PainSc:                  Cleda Mccreedy Brownie Gockel

## 2023-02-28 NOTE — Anesthesia Procedure Notes (Signed)
Procedure Name: General with mask airway Date/Time: 02/28/2023 11:52 AM  Performed by: Lily Lovings, CRNAPre-anesthesia Checklist: Patient identified, Emergency Drugs available, Suction available, Patient being monitored and Timeout performed Patient Re-evaluated:Patient Re-evaluated prior to induction Oxygen Delivery Method: Simple face mask Preoxygenation: Pre-oxygenation with 100% oxygen Induction Type: IV induction

## 2023-03-01 ENCOUNTER — Other Ambulatory Visit: Payer: Self-pay | Admitting: Nurse Practitioner

## 2023-03-01 ENCOUNTER — Encounter: Payer: Self-pay | Admitting: Gastroenterology

## 2023-03-01 DIAGNOSIS — N9489 Other specified conditions associated with female genital organs and menstrual cycle: Secondary | ICD-10-CM

## 2023-03-01 NOTE — Progress Notes (Signed)
Called patient. Reviewed that [name] was reassuring. Dr Johnnette Litter would like to repeat pelvic US in 6 months along with ca 125. Patient agreeable. Will order and coordinate appts.

## 2023-03-03 LAB — SURGICAL PATHOLOGY

## 2023-03-07 ENCOUNTER — Encounter: Payer: Self-pay | Admitting: Gastroenterology

## 2023-03-14 ENCOUNTER — Other Ambulatory Visit (HOSPITAL_BASED_OUTPATIENT_CLINIC_OR_DEPARTMENT_OTHER): Payer: Self-pay | Admitting: Cardiovascular Disease

## 2023-03-14 ENCOUNTER — Ambulatory Visit (INDEPENDENT_AMBULATORY_CARE_PROVIDER_SITE_OTHER): Payer: Medicare Other | Admitting: Family Medicine

## 2023-03-14 ENCOUNTER — Encounter: Payer: Self-pay | Admitting: Family Medicine

## 2023-03-14 ENCOUNTER — Ambulatory Visit (INDEPENDENT_AMBULATORY_CARE_PROVIDER_SITE_OTHER)
Admission: RE | Admit: 2023-03-14 | Discharge: 2023-03-14 | Disposition: A | Discharge status: Home / Self Care | Payer: Medicare Other | Source: Ambulatory Visit | Attending: Family Medicine | Admitting: Family Medicine

## 2023-03-14 VITALS — BP 148/64 | HR 50 | Temp 98.7°F | Ht 66.0 in | Wt 140.0 lb

## 2023-03-14 DIAGNOSIS — M5442 Lumbago with sciatica, left side: Secondary | ICD-10-CM

## 2023-03-14 DIAGNOSIS — M4126 Other idiopathic scoliosis, lumbar region: Secondary | ICD-10-CM | POA: Diagnosis not present

## 2023-03-14 DIAGNOSIS — M545 Low back pain, unspecified: Secondary | ICD-10-CM | POA: Diagnosis not present

## 2023-03-14 MED ORDER — PREDNISONE 20 MG PO TABS
20.0000 mg | ORAL_TABLET | Freq: Every day | ORAL | 0 refills | Status: DC
Start: 2023-03-14 — End: 2023-08-16

## 2023-03-14 NOTE — Progress Notes (Signed)
Patient ID: [name], female    DOB: Mar 02, 1941, 82 y.o.   MRN: [id number]  This visit was conducted in person.  BP (!) 148/64   Pulse (!) 50   Temp 98.7 F (37.1 C) (Temporal)   Ht 5\' 6"  (1.676 m)   Wt 140 lb (63.5 kg)   SpO2 96%   BMI 22.60 kg/m    CC:  Chief Complaint  Patient presents with   Hip Pain    Left hip pain for several months getting worse over time. Increased with walking or standing     Subjective:   HPI: [name] is a 82 y.o. female presenting on 03/14/2023 for Hip Pain (Left hip pain for several months getting worse over time. Increased with walking or standing )  Chronic left hip pain intermittently for the last 6 months.... had initially improved with treatment of chronic constipation with Miralax Reviewed last OV   for same issue .Marland Kitchen Pain had recurred..Pain in left buttock and anterior  left hip.  No radiation of pain to leg.  No new numbness or weakness.  No falls.  Tylenol off and on Gave prednisone taper then recommended home physical therapy for possible left sciatica as source of pain.. helped  some. Encouraged her to restart increasing water, fiber and restart MiraLAX.   Was doing well until she had her colonoscopy. .. Pain seemed to come back  after this.. had to lay on side.   Pain is  in left buttock, throbbing, more with lying down and walking.    October 26, 2022 x-ray  left hip: FINDINGS: There is no evidence of hip fracture or dislocation. There is no evidence of arthropathy or other focal bone abnormality. Increased stool overlying the rectosigmoid consistent with constipation noted incidentally.  IMPRESSION: Negative.    Recent abdominal [name] to follow-up on adnexal mass 02/17/2023   IMPRESSION: 5 cm benign-appearing hemorrhagic cyst in the lateral segment of the left hepatic lobe. No evidence of hepatic neoplasm.   Benign appearing bilateral renal cysts (no followup imaging is recommended). No evidence of  renal neoplasm or hydronephrosis.   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 told to stop iron supplement.  Relevant past medical, surgical, family and social history reviewed and updated as indicated. Interim medical history since our last visit reviewed. Allergies and medications reviewed and updated. Outpatient Medications Prior to Visit  Medication Sig Dispense Refill   acetaminophen (TYLENOL) 325 MG tablet Take 325 mg by mouth every 6 (six) hours as needed for mild pain.     apixaban (ELIQUIS) 2.5 MG TABS tablet Take 1 tablet (2.5 mg total) by mouth 2 (two) times daily. 180 tablet 3   D 1000 25 MCG (1000 UT) capsule TAKE 1 CAPSULE BY MOUTH AT BEDTIME. 30 capsule 3   donepezil (ARICEPT) 5 MG tablet Take 1 tablet (5 mg total) by mouth at bedtime. 30 tablet 11   doxazosin (CARDURA) 2 MG tablet Take 1 tablet (2 mg total) by mouth daily. 90 tablet 3   hydrALAZINE (APRESOLINE) 50 MG tablet Take 1 tablet (50 mg total) by mouth every 8 (eight) hours as needed. FOR BLOOD PRESSURE ABOVE 150 30 tablet 0   LUMIGAN 0.01 % SOLN SMARTSIG:In Eye(s)     ondansetron (ZOFRAN) 4 MG tablet Take 1-2 tablets (4-8 mg total) by mouth every 8 (eight) hours as needed for nausea or vomiting. 30 tablet 0   pantoprazole (PROTONIX) 40 MG  tablet TAKE 1 TABLET BY MOUTH ONCE DAILY 30 tablet 11   rosuvastatin (CRESTOR) 10 MG tablet Take 1 tablet (10 mg total) by mouth daily. 90 tablet 3   No facility-administered medications prior to visit.     Per HPI unless specifically indicated in ROS section below Review of Systems  Constitutional:  Negative for fatigue and fever.  HENT:  Negative for congestion.   Eyes:  Negative for pain.  Respiratory:  Negative for cough and shortness of breath.   Cardiovascular:  Negative for chest pain, palpitations and leg swelling.  Gastrointestinal:  Negative for abdominal pain.  Genitourinary:  Negative for dysuria and vaginal  bleeding.  Musculoskeletal:  Negative for back pain.  Neurological:  Negative for syncope, light-headedness and headaches.  Psychiatric/Behavioral:  Negative for dysphoric mood.    Objective:  BP (!) 148/64   Pulse (!) 50   Temp 98.7 F (37.1 C) (Temporal)   Ht 5\' 6"  (1.676 m)   Wt 140 lb (63.5 kg)   SpO2 96%   BMI 22.60 kg/m   Wt Readings from Last 3 Encounters:  03/14/23 140 lb (63.5 kg)  02/28/23 134 lb 7.7 oz (61 kg)  02/15/23 136 lb 12.8 oz (62.1 kg)      Physical Exam Constitutional:      General: She is not in acute distress.    Appearance: Normal appearance. She is well-developed. She is not ill-appearing or toxic-appearing.  HENT:     Head: Normocephalic.     Right Ear: Hearing, tympanic membrane, ear canal and external ear normal. Tympanic membrane is not erythematous, retracted or bulging.     Left Ear: Hearing, tympanic membrane, ear canal and external ear normal. Tympanic membrane is not erythematous, retracted or bulging.     Nose: No mucosal edema or rhinorrhea.     Right Sinus: No maxillary sinus tenderness or frontal sinus tenderness.     Left Sinus: No maxillary sinus tenderness or frontal sinus tenderness.     Mouth/Throat:     Pharynx: Uvula midline.  Eyes:     General: Lids are normal. Lids are everted, no foreign bodies appreciated.     Conjunctiva/sclera: Conjunctivae normal.     Pupils: Pupils are equal, round, and reactive to light.  Neck:     Thyroid: No thyroid mass or thyromegaly.     Vascular: No carotid bruit.     Trachea: Trachea normal.  Cardiovascular:     Rate and Rhythm: Normal rate and regular rhythm.     Pulses: Normal pulses.     Heart sounds: Normal heart sounds, S1 normal and S2 normal. No murmur heard.    No friction rub. No gallop.  Pulmonary:     Effort: Pulmonary effort is normal. No tachypnea or respiratory distress.     Breath sounds: Normal breath sounds. No decreased breath sounds, wheezing, rhonchi or rales.   Abdominal:     General: Bowel sounds are normal.     Palpations: Abdomen is soft.     Tenderness: There is no abdominal tenderness.  Musculoskeletal:     Cervical back: Normal, normal range of motion and neck supple.     Thoracic back: Normal.     Lumbar back: Spasms, tenderness and bony tenderness present. Decreased range of motion. Positive left straight leg raise test.  Skin:    General: Skin is warm and dry.     Findings: No rash.  Neurological:     Mental Status: She is alert.  Psychiatric:  Mood and Affect: Mood is not anxious or depressed.        Speech: Speech normal.        Behavior: Behavior normal. Behavior is cooperative.        Thought Content: Thought content normal.        Judgment: Judgment normal.       Results for orders placed or performed during the hospital encounter of 02/28/23  Surgical pathology  Result Value Ref Range   SURGICAL PATHOLOGY      SURGICAL PATHOLOGY CASE: [id number] PATIENT: [facility] Surgical Pathology Report     Specimen Submitted: A. Duodenum polyp; cbx B. Stomach; cbx C. Colon polyp x2, trans; c snare(1) cbx(1)  Clinical History: Epigastric pain, nausea, anemia.  Duodenal polyp, diverticulosis      DIAGNOSIS: A.  DUODENUM POLYP; COLD BIOPSY: - DUODENAL MUCOSA WITH PROMINENT BRUNNER'S GLANDS AND REACTIVE OVERLYING MUCOSAL CHANGES. - NEGATIVE FOR DYSPLASIA AND MALIGNANCY.  B.  STOMACH; COLD BIOPSY: - UNREMARKABLE GASTRIC MUCOSA. - NEGATIVE FOR H. PYLORI, INTESTINAL METAPLASIA, DYSPLASIA, AND MALIGNANCY.  C.  COLON POLYP X 2, TRANSVERSE; COLD SNARE AND COLD BIOPSY: - TUBULAR ADENOMA (MULTIPLE FRAGMENTS). - NEGATIVE FOR HIGH-GRADE DYSPLASIA AND MALIGNANCY.  GROSS DESCRIPTION: A. Labeled: cbx duodenal polyp Received: Formalin Collection time: 11:57 AM on 02/28/2023 Placed into formalin time: 11:57 AM on 02/28/2023 Tissue fragment(s): 2 Size: Range from 0.2-0.4 cm D escription: Dark tan soft tissue  fragments Entirely submitted in 1 cassette.  B. Labeled: cbx gastric for epigastric pain and nausea Received: Formalin Collection time: 11:59 AM on 02/28/2023 Placed into formalin time: 11:59 AM on 02/28/2023 Tissue fragment(s): 5 Size: Aggregate, 1.3 x 0.4 x 0.1 cm Description: Tan soft tissue fragments Entirely submitted in 1 cassette.  C. Labeled: Cold snare polyp x 1/cbx polyp x 1 transverse colon Received: Formalin Collection time: 12:17 PM on 02/28/2023 Placed into formalin time: 12:17 PM on 02/28/2023 Tissue fragment(s): Multiple Size: Aggregate, 1.7 x 0.6 x 0.2 cm Description: Tan soft tissue fragments Entirely submitted in 1 cassette.  RB 02/28/2023  Final Diagnosis performed by Elijah Birk, MD.   Electronically signed 03/03/2023 4:59:56PM The electronic signature indicates that the named Attending Pathologist has evaluated the specimen Technical component performed at [facility], [address] [place], [place] [address] Lab: [phone] Dir: Jolene Schimke, MD, MMM  Professional component performed at [facility], [facility], [address] [place], [place], [place] [address] Lab: [phone] Dir: Beryle Quant, MD     Assessment and Plan  Acute left-sided low back pain with left-sided sciatica Assessment & Plan: Acute, left low back pain, buttock pain.  Initial improvement with prednisone taper but now recurrence likely due to positioning during colonoscopy.  Will evaluate with plain x-ray.  Repeat prednisone taper.  Refer to formal physical therapy.  Continue heat and gentle home stretching until this is set up.  She will contact me if pain is not improving as expected and we can consider the [name] evaluation versus referral to specialist. No red flags for urgent evaluation.  Return and ER precautions provided.  Orders: -     Ambulatory referral to Physical Therapy -     DG Lumbar Spine Complete; Future  Other orders -     predniSONE; Take 1 tablet (20  mg total) by mouth daily with breakfast.  Dispense: 15 tablet; Refill: 0    No follow-ups on file.   Jacqueline Nora, MD

## 2023-03-14 NOTE — Patient Instructions (Signed)
Complete prednisone taper.  Start  formal physical therapy.  Heat, massage on low back.  We will call with X-ray resutls.

## 2023-03-14 NOTE — Assessment & Plan Note (Signed)
Acute, left low back pain, buttock pain.  Initial improvement with prednisone taper but now recurrence likely due to positioning during colonoscopy.  Will evaluate with plain x-ray.  Repeat prednisone taper.  Refer to formal physical therapy.  Continue heat and gentle home stretching until this is set up.  She will contact me if pain is not improving as expected and we can consider the [name] evaluation versus referral to specialist. No red flags for urgent evaluation.  Return and ER precautions provided.

## 2023-03-15 ENCOUNTER — Other Ambulatory Visit (HOSPITAL_BASED_OUTPATIENT_CLINIC_OR_DEPARTMENT_OTHER): Payer: Self-pay | Admitting: *Deleted

## 2023-03-16 ENCOUNTER — Other Ambulatory Visit (HOSPITAL_BASED_OUTPATIENT_CLINIC_OR_DEPARTMENT_OTHER): Payer: Self-pay | Admitting: *Deleted

## 2023-03-16 MED ORDER — METOPROLOL TARTRATE 25 MG PO TABS: ORAL_TABLET | ORAL | 3 refills | Status: DC

## 2023-03-16 NOTE — Telephone Encounter (Signed)
Requested refill of Metoprolol 25 mg as needed for fast heart rate   metoprolol tartrate 25mg  PRN is fine. she's used it before without issue. just overdue for f/u  Above via secure chat with Ronn Melena NP   Advised granddaughter and sent to pharmacy

## 2023-03-29 DIAGNOSIS — I351 Nonrheumatic aortic (valve) insufficiency: Secondary | ICD-10-CM | POA: Diagnosis not present

## 2023-03-29 DIAGNOSIS — I130 Hypertensive heart and chronic kidney disease with heart failure and stage 1 through stage 4 chronic kidney disease, or unspecified chronic kidney disease: Secondary | ICD-10-CM | POA: Diagnosis not present

## 2023-03-29 DIAGNOSIS — K769 Liver disease, unspecified: Secondary | ICD-10-CM | POA: Diagnosis not present

## 2023-03-29 DIAGNOSIS — Z885 Allergy status to narcotic agent status: Secondary | ICD-10-CM | POA: Diagnosis not present

## 2023-03-29 DIAGNOSIS — R9431 Abnormal electrocardiogram [ECG] [EKG]: Secondary | ICD-10-CM | POA: Diagnosis not present

## 2023-03-29 DIAGNOSIS — N281 Cyst of kidney, acquired: Secondary | ICD-10-CM | POA: Diagnosis not present

## 2023-03-29 DIAGNOSIS — N189 Chronic kidney disease, unspecified: Secondary | ICD-10-CM | POA: Diagnosis not present

## 2023-03-29 DIAGNOSIS — I480 Paroxysmal atrial fibrillation: Secondary | ICD-10-CM | POA: Diagnosis not present

## 2023-03-29 DIAGNOSIS — R2681 Unsteadiness on feet: Secondary | ICD-10-CM | POA: Diagnosis not present

## 2023-03-29 DIAGNOSIS — Z8249 Family history of ischemic heart disease and other diseases of the circulatory system: Secondary | ICD-10-CM | POA: Diagnosis not present

## 2023-03-29 DIAGNOSIS — I361 Nonrheumatic tricuspid (valve) insufficiency: Secondary | ICD-10-CM | POA: Diagnosis not present

## 2023-03-29 DIAGNOSIS — E785 Hyperlipidemia, unspecified: Secondary | ICD-10-CM | POA: Diagnosis not present

## 2023-03-29 DIAGNOSIS — Z7409 Other reduced mobility: Secondary | ICD-10-CM | POA: Diagnosis not present

## 2023-03-29 DIAGNOSIS — R609 Edema, unspecified: Secondary | ICD-10-CM | POA: Diagnosis not present

## 2023-03-29 DIAGNOSIS — R110 Nausea: Secondary | ICD-10-CM | POA: Diagnosis not present

## 2023-03-29 DIAGNOSIS — R600 Localized edema: Secondary | ICD-10-CM | POA: Diagnosis not present

## 2023-03-29 DIAGNOSIS — I5032 Chronic diastolic (congestive) heart failure: Secondary | ICD-10-CM | POA: Diagnosis not present

## 2023-03-29 DIAGNOSIS — I340 Nonrheumatic mitral (valve) insufficiency: Secondary | ICD-10-CM | POA: Diagnosis not present

## 2023-03-29 DIAGNOSIS — K5900 Constipation, unspecified: Secondary | ICD-10-CM | POA: Diagnosis not present

## 2023-03-29 DIAGNOSIS — Z79899 Other long term (current) drug therapy: Secondary | ICD-10-CM | POA: Diagnosis not present

## 2023-03-29 DIAGNOSIS — Z9181 History of falling: Secondary | ICD-10-CM | POA: Diagnosis not present

## 2023-03-29 DIAGNOSIS — R29898 Other symptoms and signs involving the musculoskeletal system: Secondary | ICD-10-CM | POA: Diagnosis not present

## 2023-03-29 DIAGNOSIS — R011 Cardiac murmur, unspecified: Secondary | ICD-10-CM | POA: Diagnosis not present

## 2023-03-29 DIAGNOSIS — R001 Bradycardia, unspecified: Secondary | ICD-10-CM | POA: Diagnosis not present

## 2023-03-29 DIAGNOSIS — M25552 Pain in left hip: Secondary | ICD-10-CM | POA: Diagnosis not present

## 2023-03-29 DIAGNOSIS — Z7901 Long term (current) use of anticoagulants: Secondary | ICD-10-CM | POA: Diagnosis not present

## 2023-03-30 ENCOUNTER — Ambulatory Visit: Payer: Medicare Other | Admitting: Nurse Practitioner

## 2023-03-30 DIAGNOSIS — I340 Nonrheumatic mitral (valve) insufficiency: Secondary | ICD-10-CM | POA: Diagnosis not present

## 2023-03-30 DIAGNOSIS — K5900 Constipation, unspecified: Secondary | ICD-10-CM | POA: Diagnosis not present

## 2023-03-30 DIAGNOSIS — I480 Paroxysmal atrial fibrillation: Secondary | ICD-10-CM | POA: Diagnosis not present

## 2023-03-30 DIAGNOSIS — I351 Nonrheumatic aortic (valve) insufficiency: Secondary | ICD-10-CM | POA: Diagnosis not present

## 2023-03-30 DIAGNOSIS — M25552 Pain in left hip: Secondary | ICD-10-CM | POA: Diagnosis not present

## 2023-03-30 DIAGNOSIS — R001 Bradycardia, unspecified: Secondary | ICD-10-CM | POA: Diagnosis not present

## 2023-04-04 ENCOUNTER — Ambulatory Visit: Payer: Medicare Other | Attending: Cardiology | Admitting: Cardiology

## 2023-04-04 ENCOUNTER — Encounter: Payer: Self-pay | Admitting: Cardiology

## 2023-04-04 VITALS — BP 160/78 | HR 55 | Ht 66.0 in | Wt 131.0 lb

## 2023-04-04 DIAGNOSIS — R002 Palpitations: Secondary | ICD-10-CM

## 2023-04-04 DIAGNOSIS — I480 Paroxysmal atrial fibrillation: Secondary | ICD-10-CM | POA: Diagnosis not present

## 2023-04-04 DIAGNOSIS — R5383 Other fatigue: Secondary | ICD-10-CM

## 2023-04-04 DIAGNOSIS — R001 Bradycardia, unspecified: Secondary | ICD-10-CM

## 2023-04-04 MED ORDER — AMIODARONE HCL 200 MG PO TABS: ORAL_TABLET | ORAL | 1 refills | Status: DC

## 2023-04-04 NOTE — Patient Instructions (Signed)
Medication Instructions:  DECREASE amiodarone 100 mg to 5 days per week (Monday through Friday)  *If you need a refill on your cardiac medications before your next appointment, please call your pharmacy*   Lab Work: No labs ordered  If you have labs (blood work) drawn today and your tests are completely normal, you will receive your results only by: MyChart Message (if you have MyChart) OR A paper copy in the mail If you have any lab test that is abnormal or we need to change your treatment, we will call you to review the results.   Testing/Procedures: No testing ordered  Follow-Up: At [facility], you and your health needs are our priority.  As part of our continuing mission to provide you with exceptional heart care, we have created designated Provider Care Teams.  These Care Teams include your primary Cardiologist (physician) and Advanced Practice Providers (APPs -  Physician Assistants and Nurse Practitioners) who all work together to provide you with the care you need, when you need it.  We recommend signing up for the patient portal called "MyChart".  Sign up information is provided on this After Visit Summary.  MyChart is used to connect with patients for Virtual Visits (Telemedicine).  Patients are able to view lab/test results, encounter notes, upcoming appointments, etc.  Non-urgent messages can be sent to your provider as well.   To learn more about what you can do with MyChart, go to [email or web address].    Your next appointment:   4-6 week(s)  Provider:   Yvonne Kendall, MD

## 2023-04-04 NOTE — Progress Notes (Signed)
Cardiology Office Note Date:  04/04/2023  Patient ID:  Jacqueline, [name] 1941-05-07, MRN [id number] PCP:  Excell Seltzer, MD  Cardiologist:  Chilton Si, MD Electrophysiologist: Regan Lemming, MD    Chief Complaint: palpitations, fatigue  History of Present Illness: [name] is a 82 y.o. female with PMH notable for persis AFib, bradycardia, CKD, mitral regurg, aortic regurg, HTN; seen today for Jacqueline Jorja Loa, MD for acute visit due to palpitations.   She is s/p AF ablation 2013 at [facility]. Had recurrence of AFib, and so loaded on amiodarone.  She last saw Dr. Elberta Fortis 06/2022, maintaining NSR on amiodarone 100mg  daily. Bradycardic.  She was hospitalized at [facility] earlier this week for increased hip pain. During hospitalization, found to be bradycardic. Labs, TTE unremarkable, so recommended zio and outpatient follow-up.  She prefers to be seen at [facility] office, so called clinic today to be seen. She is joined by husband during visit. They note that she has had increased palpitation episodes, feels heartbeat in her ears. She has also had increased fatigue over the past several months, but has been progressively worse the past few weeks. The palpitation episodes used to be intermittent, but are now constant. She also c/o morning nausea, somewhat relieved by zofran later in the day.  She is quite certain that she is in AFib today.  She diligently takes eliquis BID, no bleeding concerns.    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 cath 2/09: no CAD, EF 70%   GERD (gastroesophageal reflux disease) 07/26/2021   HYPERLIPIDEMIA    HYPERTENSION    Hypertensive urgency 07/19/2022   Mild Aortic insufficiency    Moderate mitral regurgitation    Moderate tricuspid regurgitation     OSTEOPENIA    PAF (paroxysmal atrial fibrillation) (HCC)    a. s/p ablation 2013; b. CHADS2VASc -> 4 (HTN, age x 2, female)-->Eliquis.   PAT (paroxysmal atrial tachycardia)    a. 04/2019 Zio: Occas PACs and rare PVCs. 21 atrial runs - longest 20 beats, max rate 169.   Pneumonia 2009   RA (rheumatoid arthritis) (HCC)    Sinus Bradycardia    a. asymptomatic but prevents use of AVN blocking agents; b. 04/2019 Zio: Avg HR 61 (37-109).   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 05/2019 Echo: EF 60-65%. DD. RVSP 41.50mmHg. Mod dil LA. Mod MR/TR. Mild AI.    Past Surgical History:  Procedure Laterality Date   ABLATION OF DYSRHYTHMIC FOCUS     CARDIAC CATHETERIZATION     COLONOSCOPY  04/18/08   COLONOSCOPY WITH PROPOFOL N/A 02/28/2023   Procedure: COLONOSCOPY WITH PROPOFOL;  Surgeon: Toney Reil, MD;  Location: [facility] ENDOSCOPY;  Service: Gastroenterology;  Laterality: N/A;   ESOPHAGOGASTRODUODENOSCOPY (EGD) WITH PROPOFOL N/A 02/28/2023   Procedure: ESOPHAGOGASTRODUODENOSCOPY (EGD) WITH PROPOFOL;  Surgeon: Toney Reil, MD;  Location: [facility] ENDOSCOPY;  Service: Gastroenterology;  Laterality: N/A;   Partial hysterectomy--1979     Thoracentesis   12/20/07      Current Outpatient Medications  Medication Instructions   acetaminophen (TYLENOL) 325 mg, Oral, Every 6 hours PRN   apixaban (ELIQUIS) 2.5 mg, Oral, 2 times daily   D 1000 25 MCG (1000 UT) capsule TAKE 1 CAPSULE BY MOUTH AT  BEDTIME.   donepezil (ARICEPT) 5 mg, Oral, Daily at bedtime   doxazosin (CARDURA) 2 mg, Oral, Daily   hydrALAZINE (APRESOLINE) 50 mg, Oral, Every 8 hours PRN, FOR BLOOD PRESSURE ABOVE 150   LUMIGAN 0.01 % SOLN SMARTSIG:In Eye(s)   metoprolol tartrate (LOPRESSOR) 25 MG tablet As needed for fast heartrate   ondansetron (ZOFRAN) 4-8 mg, Oral, Every 8 hours PRN   pantoprazole (PROTONIX) 40 mg, Oral, Daily   predniSONE (DELTASONE) 20 mg, Oral, Daily with breakfast   rosuvastatin (CRESTOR) 10  mg, Oral, Daily    Social History:  The patient  reports that she has never smoked. She has never used smokeless tobacco. She reports that she does not drink alcohol and does not use drugs.   Family History:  The patient's family history includes Atrial fibrillation in her brother; Breast cancer (age of onset: 45) in her paternal aunt; Diabetes in an other family member; Heart attack in her brother; Heart disease in her brother and mother; Pancreatic cancer in her father.  ROS:  Please see the history of present illness. All other systems are reviewed and otherwise negative.   PHYSICAL EXAM:   Vitals:   04/04/23 1440 04/04/23 1540  BP: (!) 160/78 (!) 160/78  Pulse: (!) 55   Height: 5\' 6"  (1.676 m)   Weight: 131 lb (59.4 kg)   SpO2: 99%   BMI (Calculated): 21.15     GEN- The patient is well appearing, alert and oriented x 3 today.   Lungs- Clear to ausculation bilaterally, normal work of breathing.  Heart- Regular rate and rhythm, no murmurs, rubs or gallops Extremities- Trace peripheral edema, warm, dry   EKG is ordered. Personal review of EKG from today shows: Regular, narrow QRS with very short PR of ~29ms ?ectopic atrial rhythm        Recent Labs: 07/06/2022: Pro B Natriuretic peptide (BNP) 323.0 10/19/2022: ALT 13; BUN 28; Creatinine, Ser 1.97; Hemoglobin 10.3; Platelets 160; Potassium 3.8; Sodium 138 12/08/2022: TSH 1.65  No results found for requested labs within last 365 days.   CrCl cannot be calculated (Patient's most recent lab result is older than the maximum 21 days allowed.).   Wt Readings from Last 3 Encounters:  03/14/23 140 lb (63.5 kg)  02/28/23 134 lb 7.7 oz (61 kg)  02/15/23 136 lb 12.8 oz (62.1 kg)     Additional studies reviewed include: Previous EP, cardiology notes.   TTE, 03/30/2023  1. The left ventricular systolic function is normal, LVEF is visually estimated at 60-65%.  2. There is mild mitral valve regurgitation.  3. The left atrium is  moderately dilated in size.  4. The aortic valve is trileaflet with mildly thickened leaflets with normal excursion.  5. There is mild aortic regurgitation.  6. The right ventricle is upper normal in size, with normal systolic function.  7. There is mild to moderate tricuspid regurgitation.  8. The right atrium is moderately dilated in size.  9. IVC size and inspiratory change suggest mildly elevated right atrial pressure. (5-10 mmHg).   TTE, 12/29/2021 1. Left ventricular ejection fraction, by estimation, is 60 to 65%. The left ventricle has normal function. The left ventricle has no regional wall motion abnormalities. Left ventricular diastolic parameters are indeterminate.  2. Right ventricular systolic function is normal. The right ventricular size is normal. There is mildly elevated pulmonary artery systolic pressure.  3. Left atrial size was severely dilated.  4. Right atrial size was severely dilated.  5. The mitral  valve is degenerative. Moderate mitral valve regurgitation.  6. Tricuspid valve regurgitation is mild to moderate.  7. The aortic valve is grossly normal. Aortic valve regurgitation is mild to moderate. Aortic valve sclerosis/calcification is present, without any evidence of aortic stenosis.  8. The inferior vena cava is normal in size with greater than 50% respiratory variability, suggesting right atrial pressure of 3 mmHg.   Comparison(s): EF 60%, MV thickening w/ moderate MR, mild AI, no AS.   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 No significant changes from prior.    ASSESSMENT AND PLAN:  #) palpitations #) fatigue #) parox AFib #) bradycardia Patient feeling palpitations and "AFib" during today's EKG Reassuringly, patient is not in afib, but appears to be in a regular atrial ectopic rhythm Decrease amiodarone to 100mg  5 days / week Labs earlier this week at [facility] with stable thyroid and liver fxn Patient walked  around clinic with HR rising to mid 70s   Current medicines are reviewed at length with the patient today.   The patient has concerns regarding her medicines.  The following changes were made today:   DECREASE amiodarone 100mg  5 days / week for total of 500mg /week  Labs/ tests ordered today include:  Orders Placed This Encounter  Procedures   EKG 12-Lead     Disposition: Follow up with Dr. Elberta Fortis or EP APP in 6 months  Follow-up 1 month with Dr. Okey Dupre to re-establish care with gen cards here at [facility]  Signed, Sherie Don, NP  04/04/23  2:26 PM  Electrophysiology [facility]

## 2023-04-06 ENCOUNTER — Telehealth: Payer: Self-pay | Admitting: *Deleted

## 2023-04-06 ENCOUNTER — Other Ambulatory Visit: Payer: Self-pay | Admitting: *Deleted

## 2023-04-06 MED ORDER — AMIODARONE HCL 100 MG PO TABS: ORAL_TABLET | ORAL | 1 refills | Status: DC

## 2023-04-06 NOTE — Telephone Encounter (Signed)
Amiodarone 100 mg 5 days weekly sent into [facility] per Sherie Don, NP   Amiodarone 100 mg: Sig: Take one tablet 5 days weekly on Monday, Tuesday, Thursday, Friday and Saturday.

## 2023-04-19 ENCOUNTER — Other Ambulatory Visit: Payer: Medicare Other

## 2023-04-19 ENCOUNTER — Ambulatory Visit: Payer: Medicare Other | Admitting: Oncology

## 2023-04-19 ENCOUNTER — Inpatient Hospital Stay: Payer: Medicare Other | Attending: Oncology

## 2023-04-19 DIAGNOSIS — H401132 Primary open-angle glaucoma, bilateral, moderate stage: Secondary | ICD-10-CM | POA: Diagnosis not present

## 2023-04-19 DIAGNOSIS — H35341 Macular cyst, hole, or pseudohole, right eye: Secondary | ICD-10-CM | POA: Diagnosis not present

## 2023-04-19 DIAGNOSIS — H2512 Age-related nuclear cataract, left eye: Secondary | ICD-10-CM | POA: Diagnosis not present

## 2023-04-20 ENCOUNTER — Ambulatory Visit (INDEPENDENT_AMBULATORY_CARE_PROVIDER_SITE_OTHER): Payer: Medicare Other

## 2023-04-20 VITALS — Ht 66.0 in | Wt 140.0 lb

## 2023-04-20 DIAGNOSIS — Z0000 Encounter for general adult medical examination without abnormal findings: Secondary | ICD-10-CM

## 2023-04-20 NOTE — Progress Notes (Signed)
Subjective:   [name] is a 82 y.o. female who presents for Medicare Annual (Subsequent) preventive examination.  Visit Complete: Virtual  I connected with  [name] on 04/20/23 by a audio enabled telemedicine application and verified that I am speaking with the correct person using two identifiers.  Patient Location: Home  Provider Location: Office/Clinic  I discussed the limitations of evaluation and management by telemedicine. The patient expressed understanding and agreed to proceed.   Review of Systems      Cardiac Risk Factors include: advanced age (>47men, >43 women);hypertension;sedentary lifestyle;dyslipidemia     Objective:    Today's Vitals   04/20/23 1434  Weight: 140 lb (63.5 kg)  Height: 5\' 6"  (1.676 m)   Body mass index is 22.6 kg/m.     04/20/2023    2:42 PM 02/28/2023   11:06 AM 02/15/2023    9:58 AM 02/15/2023    9:57 AM 12/28/2022   10:57 AM 11/11/2022    9:28 AM 10/19/2022   11:18 AM  Advanced Directives  Does Patient Have a Medical Advance Directive? Yes Yes No No No Yes Yes  Type of Estate agent of [place];Living will     Living will Living will  Copy of Healthcare Power of Attorney in Chart? No - copy requested        Would patient like information on creating a medical advance directive?   No - Patient declined No - Patient declined No - Patient declined      Current Medications (verified) Outpatient Encounter Medications as of 04/20/2023  Medication Sig   acetaminophen (TYLENOL) 325 MG tablet Take 325 mg by mouth every 6 (six) hours as needed for mild pain.   amiodarone (PACERONE) 100 MG tablet Take one tablet 5 days weekly on Monday, Tuesday, Thursday, Friday and Saturday.   apixaban (ELIQUIS) 2.5 MG TABS tablet Take 1 tablet (2.5 mg total) by mouth 2 (two) times daily.   D 1000 25 MCG (1000 UT) capsule TAKE 1 CAPSULE BY MOUTH AT BEDTIME.   donepezil (ARICEPT) 5 MG tablet Take 1 tablet (5 mg total) by mouth  at bedtime.   doxazosin (CARDURA) 2 MG tablet Take 1 tablet (2 mg total) by mouth daily.   hydrALAZINE (APRESOLINE) 50 MG tablet Take 1 tablet (50 mg total) by mouth every 8 (eight) hours as needed. FOR BLOOD PRESSURE ABOVE 150   LUMIGAN 0.01 % SOLN Place 1 drop into both eyes at bedtime.   ondansetron (ZOFRAN) 4 MG tablet Take 1-2 tablets (4-8 mg total) by mouth every 8 (eight) hours as needed for nausea or vomiting.   pantoprazole (PROTONIX) 40 MG tablet TAKE 1 TABLET BY MOUTH ONCE DAILY   polyethylene glycol (MIRALAX / GLYCOLAX) 17 g packet Take 17 g by mouth daily as needed.   predniSONE (DELTASONE) 20 MG tablet Take 1 tablet (20 mg total) by mouth daily with breakfast.   rosuvastatin (CRESTOR) 10 MG tablet Take 1 tablet (10 mg total) by mouth daily.   No facility-administered encounter medications on file as of 04/20/2023.    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 cath 2/09: no CAD, EF 70%   GERD (gastroesophageal reflux disease) 07/26/2021   HYPERLIPIDEMIA    HYPERTENSION  Hypertensive urgency 07/19/2022   Mild Aortic insufficiency    Moderate mitral regurgitation    Moderate tricuspid regurgitation    OSTEOPENIA    PAF (paroxysmal atrial fibrillation) (HCC)    a. s/p ablation 2013; b. CHADS2VASc -> 4 (HTN, age x 2, female)-->Eliquis.   PAT (paroxysmal atrial tachycardia)    a. 04/2019 Zio: Occas PACs and rare PVCs. 21 atrial runs - longest 20 beats, max rate 169.   Pneumonia 2009   RA (rheumatoid arthritis) (HCC)    Sinus Bradycardia    a. asymptomatic but prevents use of AVN blocking agents; b. 04/2019 Zio: Avg HR 61 (37-109).   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 05/2019 Echo: EF 60-65%. DD. RVSP 41.68mmHg. Mod dil LA. Mod MR/TR. Mild  AI.   Past Surgical History:  Procedure Laterality Date   ABLATION OF DYSRHYTHMIC FOCUS     CARDIAC CATHETERIZATION     COLONOSCOPY  04/18/08   COLONOSCOPY WITH PROPOFOL N/A 02/28/2023   Procedure: COLONOSCOPY WITH PROPOFOL;  Surgeon: Toney Reil, MD;  Location[facility] ENDOSCOPY;  Service: Gastroenterology;  Laterality: N/A;   ESOPHAGOGASTRODUODENOSCOPY (EGD) WITH PROPOFOL N/A 02/28/2023   Procedure: ESOPHAGOGASTRODUODENOSCOPY (EGD) WITH PROPOFOL;  Surgeon: Toney Reil, MD;  [facility] ENDOSCOPY;  Service: Gastroenterology;  Laterality: N/A;   Partial hysterectomy--1979     Thoracentesis   12/20/07     Family History  Problem Relation Age of Onset   Diabetes Other    Breast cancer Paternal Aunt 16   Heart disease Mother    Pancreatic cancer Father    Atrial fibrillation Brother    Heart disease Brother    Heart attack Brother    Social History   Socioeconomic History   Marital status: Married    Spouse name: Not on file   Number of children: Not on file   Years of education: Not on file   Highest education level: Not on file  Occupational History   Not on file  Tobacco Use   Smoking status: Never   Smokeless tobacco: Never  Vaping Use   Vaping status: Never Used  Substance and Sexual Activity   Alcohol use: No    Alcohol/week: 0.0 standard drinks of alcohol   Drug use: No   Sexual activity: Not Currently    Birth control/protection: None  Other Topics Concern   Not on file  Social History Narrative   Marital Status: widow x 1 yrChildren: 5, grandchildren 107, numerous great grand 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 Full Code ( reviewed 2015) Occasional exercise.Diet: fruits and veggies,  lean meats.   Right handed    Caffeine- none    Social Determinants of Health   Financial Resource Strain: Low Risk  (04/20/2023)   Overall Financial Resource Strain (CARDIA)    Difficulty of Paying Living Expenses: Not hard at all  Food Insecurity: No Food Insecurity (04/20/2023)   Hunger Vital Sign    Worried About Running Out of Food in the Last Year: Never true    Ran Out of Food in the Last Year: Never true  Transportation Needs: No Transportation Needs (04/20/2023)   PRAPARE - Administrator, Civil Service (Medical): No    Lack of Transportation (Non-Medical): No  Physical Activity: Inactive (04/20/2023)   Exercise Vital Sign  Days of Exercise per Week: 0 days    Minutes of Exercise per Session: 0 min  Stress: No Stress Concern Present (04/20/2023)   [employer] of Occupational Health - Occupational Stress Questionnaire    Feeling of Stress : Not at all  Social Connections: Moderately Integrated (04/20/2023)   Social Connection and Isolation Panel [NHANES]    Frequency of Communication with Friends and Family: More than three times a week    Frequency of Social Gatherings with Friends and Family: More than three times a week    Attends Religious Services: More than 4 times per year    Active Member of [employer] or Organizations: No    Attends Engineer, structural: Never    Marital Status: Married    Tobacco Counseling Counseling given: Not Answered   Clinical Intake:  Pre-visit preparation completed: Yes  Pain : No/denies pain     BMI - recorded: 22.6 Nutritional Status: BMI of 19-24  Normal Nutritional Risks: Nausea/ vomitting/ diarrhea (some loose stools for approx 1 week) Diabetes: No  How often do you need to have someone help you when you read instructions, pamphlets, or other written materials from your doctor or pharmacy?: 1 - Never  Interpreter Needed?: No  Information entered by :: C.Joanne Salah LPN   Activities of Daily  Living    04/20/2023    2:43 PM  In your present state of health, do you have any difficulty performing the following activities:  Hearing? 0  Vision? 0  Difficulty concentrating or making decisions? 1  Comment occasionally forgets  Walking or climbing stairs? 0  Dressing or bathing? 0  Doing errands, shopping? 0  Preparing Food and eating ? N  Using the Toilet? N  In the past six months, have you accidently leaked urine? N  Do you have problems with loss of bowel control? N  Managing your Medications? N  Managing your Finances? N  Housekeeping or managing your Housekeeping? N    Patient Care Team: Excell Seltzer, MD as PCP - General Chilton Si, MD as PCP - Cardiology (Cardiology) Regan Lemming, MD as PCP - Electrophysiology (Cardiology) Galen Manila, MD as Referring Physician (Ophthalmology) Benita Gutter, RN as Oncology Nurse Navigator  Indicate any recent Medical Services you may have received from other than [facility] providers in the past year (date may be approximate).     Assessment:   This is a routine wellness examination for Maylyn.  Hearing/Vision screen Hearing Screening - Comments:: No hearing issues Vision Screening - Comments:: Glasses - [facility] - UTD on eye exams last exam 04/19/23  Dietary issues and exercise activities discussed:     Goals Addressed             This Visit's Progress    Patient Stated       Increase activity.       Depression Screen    04/20/2023    2:41 PM 03/14/2023    2:51 PM 01/12/2023   10:15 AM 10/26/2022   11:42 AM 04/11/2022   11:33 AM 08/08/2019   11:45 AM 07/16/2018   12:18 PM  PHQ 2/9 Scores  PHQ - 2 Score 0 0 0 0 0 0 0  PHQ- 9 Score 0 0  1   0    Fall Risk    04/20/2023    2:43 PM 03/14/2023    2:51 PM 01/12/2023   10:15 AM 10/26/2022   11:42 AM 04/11/2022   11:33 AM  Fall  Risk   [name] in the past year? 0 0 0 0   Number [name] in past yr: 0 0 0 0 0  Injury with Fall? 0 0 0 0 0  Risk for  fall due to : No Fall Risks No Fall Risks  No Fall Risks Medication side effect  Follow up [name] prevention discussed;[name] evaluation completed Follow up appointment [name] evaluation completed;Education provided;[name] prevention discussed [name] evaluation completed [name] evaluation completed;Education provided;[name] prevention discussed    MEDICARE RISK AT HOME:  Medicare Risk at Home - 04/20/23 1445     Any stairs in or around the home? Yes    If so, are there any without handrails? No    Home free of loose throw rugs in walkways, pet beds, electrical cords, etc? Yes    Adequate lighting in your home to reduce risk of [name]? Yes    Life alert? No    Use of a cane, walker or w/c? No    Grab bars in the bathroom? Yes    Shower chair or bench in shower? Yes    Elevated toilet seat or a handicapped toilet? Yes             TIMED UP AND GO:  Was the test performed?  No    Cognitive Function:    07/16/2018   12:19 PM 07/12/2017    9:38 AM 03/10/2016   11:11 AM  MMSE - Mini Mental State Exam  Orientation to time 5 5 5   Orientation to Place 5 5 5   Registration 3 3 3   Attention/ Calculation 0 0 0  Recall 3 1 3   Recall-comments  unable to recall 2 of 3 words   Language- name 2 objects 0 0 0  Language- repeat 1 1 1   Language- follow 3 step command 3 3 0  Language- follow 3 step command-comments   pt was unable to follow 3 steps to 3 step command  Language- read & follow direction 0 0 0  Write a sentence 0 0 0  Copy design 0 0 0  Total score 20 18 17         04/20/2023    2:46 PM  6CIT Screen  What Year? 4 points  What month? 0 points  What time? 3 points  Count back from 20 4 points  Months in reverse 4 points  Repeat phrase 10 points  Total Score 25 points    Immunizations Immunization History  Administered Date(s) Administered   COVID-19, mRNA, vaccine(Comirnaty)12 years and older 08/24/2022   Fluad Quad(high Dose 65+) 07/17/2019, 11/10/2020, 10/13/2021,  08/04/2022   Influenza Whole 08/05/2009   Influenza, High Dose Seasonal PF 07/14/2015   Influenza,inj,Quad PF,6+ Mos 08/25/2016, 07/14/2017, 07/16/2018   PFIZER(Purple Top)SARS-COV-2 Vaccination 11/04/2019, 11/25/2019, 09/25/2020   PPD Test 05/01/2012, 04/17/2013   Pneumococcal Conjugate-13 04/17/2015   Pneumococcal Polysaccharide-23 10/11/2007   Td 10/11/2003, 02/13/2019    TDAP status: Up to date  Flu Vaccine status: Up to date  Pneumococcal vaccine status: Up to date  Covid-19 vaccine status: Information provided on how to obtain vaccines.   Qualifies for Shingles Vaccine? Yes   Zostavax completed No   Shingrix Completed?: No.    Education has been provided regarding the importance of this vaccine. Patient has been advised to call insurance company to determine out of pocket expense if they have not yet received this vaccine. Advised may also receive vaccine at local pharmacy or Health Dept. Verbalized acceptance and understanding.  Screening Tests Health Maintenance  Topic Date Due   Zoster Vaccines- Shingrix (1 of 2) Never done   MAMMOGRAM  01/20/2019   Medicare Annual Wellness (AWV)  04/12/2023   COVID-19 Vaccine (5 - 2023-24 season) 01/12/2024 (Originally 10/19/2022)   INFLUENZA VACCINE  05/11/2023   Colonoscopy  02/28/2028   DTaP/Tdap/Td (3 - Tdap) 02/12/2029   Pneumonia Vaccine 51+ Years old  Completed   DEXA SCAN  Completed   HPV VACCINES  Aged Out   Hepatitis C Screening  Discontinued    Health Maintenance  Health Maintenance Due  Topic Date Due   Zoster Vaccines- Shingrix (1 of 2) Never done   MAMMOGRAM  01/20/2019   Medicare Annual Wellness (AWV)  04/12/2023    Colorectal cancer screening: No longer required.   Mammogram status: No longer required due to age.  Bone Density - Declined  Lung Cancer Screening: (Low Dose CT Chest recommended if Age 32-80 years, 20 pack-year currently smoking OR have quit w/in 15years.) does not qualify.   Lung Cancer  Screening Referral: no  Additional Screening:  Hepatitis C Screening: does qualify; Completed 12/28/12  Vision Screening: Recommended annual ophthalmology exams for early detection of glaucoma and other disorders of the eye. Is the patient up to date with their annual eye exam?  Yes  Who is the provider or what is the name of the office in which the patient attends annual eye exams? [facility] If pt is not established with a provider, would they like to be referred to a provider to establish care? Yes .   Dental Screening: Recommended annual dental exams for proper oral hygiene    Community Resource Referral / Chronic Care Management: CRR required this visit?  No   CCM required this visit?  No     Plan:     I have personally reviewed and noted the following in the patient's chart:   Medical and social history Use of alcohol, tobacco or illicit drugs  Current medications and supplements including opioid prescriptions. Patient is not currently taking opioid prescriptions. Functional ability and status Nutritional status Physical activity Advanced directives List of other physicians Hospitalizations, surgeries, and ER visits in previous 12 months Vitals Screenings to include cognitive, depression, and [name] Referrals and appointments  In addition, I have reviewed and discussed with patient certain preventive protocols, quality metrics, and best practice recommendations. A written personalized care plan for preventive services as well as general preventive health recommendations were provided to patient.     Maryan Puls, LPN   12/29/252   After Visit Summary: (MyChart) Due to this being a telephonic visit, the after visit summary with patients personalized plan was offered to patient via MyChart   Nurse Notes: 6CIT score 25

## 2023-04-20 NOTE — Patient Instructions (Signed)
[name] , Thank you for taking time to come for your Medicare Wellness Visit. I appreciate your ongoing commitment to your health goals. Please review the following plan we discussed and let me know if I can assist you in the future.   These are the goals we discussed:  Goals      Blood Pressure < 130/80     care coordination activities - no further follow up needed     Interventions Today    Flowsheet Row Most Recent Value  General Interventions   General Interventions Discussed/Reviewed General Interventions Discussed  [Care coordination program/ service discussed, social determinants of health survey completed. AWV discussed and patient advised to contact provider office to schedule. Advised to contact [facility] care provider office if care coordination services needed]           Patient Stated     04/11/2022, wants to get back to the gym     Patient Stated     Increase activity.     Weight < 148 lb (67.132 kg)     Target weight is 140lbs. Starting 07/16/2018, I will continue to exercise for at least 30 min 3 days per week.         This is a list of the screening recommended for you and due dates:  Health Maintenance  Topic Date Due   Zoster (Shingles) Vaccine (1 of 2) Never done   Mammogram  01/20/2019   Medicare Annual Wellness Visit  04/12/2023   COVID-19 Vaccine (5 - 2023-24 season) 01/12/2024*   Flu Shot  05/11/2023   Colon Cancer Screening  02/28/2028   DTaP/Tdap/Td vaccine (3 - Tdap) 02/12/2029   Pneumonia Vaccine  Completed   DEXA scan (bone density measurement)  Completed   HPV Vaccine  Aged Out   Hepatitis C Screening  Discontinued  *Topic was postponed. The date shown is not the original due date.    Advanced directives: Please bring a copy of your health care power of attorney and living will to the office to be added to your chart at your convenience.   Conditions/risks identified: Aim for 30 minutes of exercise or brisk walking, 6-8 glasses of water, and  5 servings of fruits and vegetables each day.   Next appointment: Follow up in one year for your annual wellness visit 04/24/24 @ 1pm telephone visit   Preventive Care 65 Years and Older, Female Preventive care refers to lifestyle choices and visits with your health care provider that can promote health and wellness. What does preventive care include? A yearly physical exam. This is also called an annual well check. Dental exams once or twice a year. Routine eye exams. Ask your health care provider how often you should have your eyes checked. Personal lifestyle choices, including: Daily care of your teeth and gums. Regular physical activity. Eating a healthy diet. Avoiding tobacco and drug use. Limiting alcohol use. Practicing safe sex. Taking low-dose aspirin every day. Taking vitamin and mineral supplements as recommended by your health care provider. What happens during an annual well check? The services and screenings done by your health care provider during your annual well check will depend on your age, overall health, lifestyle risk factors, and family history of disease. Counseling  Your health care provider may ask you questions about your: Alcohol use. Tobacco use. Drug use. Emotional well-being. Home and relationship well-being. Sexual activity. Eating habits. History of falls. Memory and ability to understand (cognition). Work and work Astronomer. Reproductive health. Screening  You may have the following tests or measurements: Height, weight, and BMI. Blood pressure. Lipid and cholesterol levels. These may be checked every 5 years, or more frequently if you are over 68 years old. Skin check. Lung cancer screening. You may have this screening every year starting at age 10 if you have a 30-pack-year history of smoking and currently smoke or have quit within the past 15 years. Fecal occult blood test (FOBT) of the stool. You may have this test every year starting  at age 72. Flexible sigmoidoscopy or colonoscopy. You may have a sigmoidoscopy every 5 years or a colonoscopy every 10 years starting at age 84. Hepatitis C blood test. Hepatitis B blood test. Sexually transmitted disease (STD) testing. Diabetes screening. This is done by checking your blood sugar (glucose) after you have not eaten for a while (fasting). You may have this done every 1-3 years. Bone density scan. This is done to screen for osteoporosis. You may have this done starting at age 77. Mammogram. This may be done every 1-2 years. Talk to your health care provider about how often you should have regular mammograms. Talk with your health care provider about your test results, treatment options, and if necessary, the need for more tests. Vaccines  Your health care provider may recommend certain vaccines, such as: Influenza vaccine. This is recommended every year. Tetanus, diphtheria, and acellular pertussis (Tdap, Td) vaccine. You may need a Td booster every 10 years. Zoster vaccine. You may need this after age 30. Pneumococcal 13-valent conjugate (PCV13) vaccine. One dose is recommended after age 79. Pneumococcal polysaccharide (PPSV23) vaccine. One dose is recommended after age 69. Talk to your health care provider about which screenings and vaccines you need and how often you need them. This information is not intended to replace advice given to you by your health care provider. Make sure you discuss any questions you have with your health care provider. Document Released: 10/23/2015 Document Revised: 06/15/2016 Document Reviewed: 07/28/2015 Elsevier Interactive Patient Education  2017 [employer].  Fall Prevention in the Home Falls can cause injuries. They can happen to people of all ages. There are many things you can do to make your home safe and to help prevent falls. What can I do on the outside of my home? Regularly fix the edges of walkways and driveways and fix any  cracks. Remove anything that might make you trip as you walk through a door, such as a raised step or threshold. Trim any bushes or trees on the path to your home. Use bright outdoor lighting. Clear any walking paths of anything that might make someone trip, such as rocks or tools. Regularly check to see if handrails are loose or broken. Make sure that both sides of any steps have handrails. Any raised decks and porches should have guardrails on the edges. Have any leaves, snow, or ice cleared regularly. Use sand or salt on walking paths during winter. Clean up any spills in your garage right away. This includes oil or grease spills. What can I do in the bathroom? Use night lights. Install grab bars by the toilet and in the tub and shower. Do not use towel bars as grab bars. Use non-skid mats or decals in the tub or shower. If you need to sit down in the shower, use a plastic, non-slip stool. Keep the floor dry. Clean up any water that spills on the floor as soon as it happens. Remove soap buildup in the tub or shower regularly.  Attach bath mats securely with double-sided non-slip rug tape. Do not have throw rugs and other things on the floor that can make you trip. What can I do in the bedroom? Use night lights. Make sure that you have a light by your bed that is easy to reach. Do not use any sheets or blankets that are too big for your bed. They should not hang down onto the floor. Have a firm chair that has side arms. You can use this for support while you get dressed. Do not have throw rugs and other things on the floor that can make you trip. What can I do in the kitchen? Clean up any spills right away. Avoid walking on wet floors. Keep items that you use a lot in easy-to-reach places. If you need to reach something above you, use a strong step stool that has a grab bar. Keep electrical cords out of the way. Do not use floor polish or wax that makes floors slippery. If you must  use wax, use non-skid floor wax. Do not have throw rugs and other things on the floor that can make you trip. What can I do with my stairs? Do not leave any items on the stairs. Make sure that there are handrails on both sides of the stairs and use them. Fix handrails that are broken or loose. Make sure that handrails are as long as the stairways. Check any carpeting to make sure that it is firmly attached to the stairs. Fix any carpet that is loose or worn. Avoid having throw rugs at the top or bottom of the stairs. If you do have throw rugs, attach them to the floor with carpet tape. Make sure that you have a light switch at the top of the stairs and the bottom of the stairs. If you do not have them, ask someone to add them for you. What else can I do to help prevent falls? Wear shoes that: Do not have high heels. Have rubber bottoms. Are comfortable and fit you well. Are closed at the toe. Do not wear sandals. If you use a stepladder: Make sure that it is fully opened. Do not climb a closed stepladder. Make sure that both sides of the stepladder are locked into place. Ask someone to hold it for you, if possible. Clearly mark and make sure that you can see: Any grab bars or handrails. First and last steps. Where the edge of each step is. Use tools that help you move around (mobility aids) if they are needed. These include: Canes. Walkers. Scooters. Crutches. Turn on the lights when you go into a dark area. Replace any light bulbs as soon as they burn out. Set up your furniture so you have a clear path. Avoid moving your furniture around. If any of your floors are uneven, fix them. If there are any pets around you, be aware of where they are. Review your medicines with your doctor. Some medicines can make you feel dizzy. This can increase your chance of falling. Ask your doctor what other things that you can do to help prevent falls. This information is not intended to replace  advice given to you by your health care provider. Make sure you discuss any questions you have with your health care provider. Document Released: 07/23/2009 Document Revised: 03/03/2016 Document Reviewed: 10/31/2014 Elsevier Interactive Patient Education  2017 [employer].

## 2023-04-24 NOTE — Telephone Encounter (Signed)
error 

## 2023-05-03 ENCOUNTER — Ambulatory Visit: Payer: Medicare Other | Admitting: Internal Medicine

## 2023-05-03 DIAGNOSIS — I480 Paroxysmal atrial fibrillation: Secondary | ICD-10-CM | POA: Diagnosis not present

## 2023-05-03 DIAGNOSIS — I10 Essential (primary) hypertension: Secondary | ICD-10-CM | POA: Diagnosis not present

## 2023-05-03 DIAGNOSIS — R001 Bradycardia, unspecified: Secondary | ICD-10-CM | POA: Diagnosis not present

## 2023-05-03 DIAGNOSIS — N1832 Chronic kidney disease, stage 3b: Secondary | ICD-10-CM | POA: Diagnosis not present

## 2023-05-03 DIAGNOSIS — R002 Palpitations: Secondary | ICD-10-CM | POA: Diagnosis not present

## 2023-05-03 DIAGNOSIS — I340 Nonrheumatic mitral (valve) insufficiency: Secondary | ICD-10-CM | POA: Diagnosis not present

## 2023-05-03 DIAGNOSIS — E7849 Other hyperlipidemia: Secondary | ICD-10-CM | POA: Diagnosis not present

## 2023-05-03 DIAGNOSIS — I071 Rheumatic tricuspid insufficiency: Secondary | ICD-10-CM | POA: Diagnosis not present

## 2023-05-03 DIAGNOSIS — I351 Nonrheumatic aortic (valve) insufficiency: Secondary | ICD-10-CM | POA: Diagnosis not present

## 2023-05-05 ENCOUNTER — Other Ambulatory Visit (HOSPITAL_BASED_OUTPATIENT_CLINIC_OR_DEPARTMENT_OTHER): Payer: Self-pay | Admitting: Cardiovascular Disease

## 2023-05-15 DIAGNOSIS — I2510 Atherosclerotic heart disease of native coronary artery without angina pectoris: Secondary | ICD-10-CM | POA: Diagnosis not present

## 2023-05-15 DIAGNOSIS — E785 Hyperlipidemia, unspecified: Secondary | ICD-10-CM | POA: Diagnosis not present

## 2023-05-15 DIAGNOSIS — N2581 Secondary hyperparathyroidism of renal origin: Secondary | ICD-10-CM | POA: Diagnosis not present

## 2023-05-15 DIAGNOSIS — I4891 Unspecified atrial fibrillation: Secondary | ICD-10-CM | POA: Diagnosis not present

## 2023-05-15 DIAGNOSIS — I129 Hypertensive chronic kidney disease with stage 1 through stage 4 chronic kidney disease, or unspecified chronic kidney disease: Secondary | ICD-10-CM | POA: Diagnosis not present

## 2023-05-15 DIAGNOSIS — N3289 Other specified disorders of bladder: Secondary | ICD-10-CM | POA: Diagnosis not present

## 2023-05-15 DIAGNOSIS — N184 Chronic kidney disease, stage 4 (severe): Secondary | ICD-10-CM | POA: Diagnosis not present

## 2023-05-16 LAB — LAB REPORT - SCANNED
Albumin, Urine POC: 128.8
Albumin/Creatinine Ratio, Urine, POC: 117
Creatinine, POC: 109.9 mg/dL
EGFR: 27

## 2023-05-25 DIAGNOSIS — R001 Bradycardia, unspecified: Secondary | ICD-10-CM | POA: Diagnosis not present

## 2023-05-25 DIAGNOSIS — I480 Paroxysmal atrial fibrillation: Secondary | ICD-10-CM | POA: Diagnosis not present

## 2023-05-25 DIAGNOSIS — H401132 Primary open-angle glaucoma, bilateral, moderate stage: Secondary | ICD-10-CM | POA: Diagnosis not present

## 2023-05-25 DIAGNOSIS — I10 Essential (primary) hypertension: Secondary | ICD-10-CM | POA: Diagnosis not present

## 2023-05-25 DIAGNOSIS — N1832 Chronic kidney disease, stage 3b: Secondary | ICD-10-CM | POA: Diagnosis not present

## 2023-05-29 DIAGNOSIS — I480 Paroxysmal atrial fibrillation: Secondary | ICD-10-CM | POA: Diagnosis not present

## 2023-05-29 DIAGNOSIS — R008 Other abnormalities of heart beat: Secondary | ICD-10-CM | POA: Diagnosis not present

## 2023-05-29 DIAGNOSIS — I471 Supraventricular tachycardia, unspecified: Secondary | ICD-10-CM | POA: Diagnosis not present

## 2023-05-31 ENCOUNTER — Ambulatory Visit (INDEPENDENT_AMBULATORY_CARE_PROVIDER_SITE_OTHER): Payer: Medicare Other | Admitting: Family Medicine

## 2023-05-31 ENCOUNTER — Encounter: Payer: Self-pay | Admitting: Family Medicine

## 2023-05-31 VITALS — BP 118/76 | HR 56 | Temp 98.0°F | Ht 66.0 in | Wt 131.0 lb

## 2023-05-31 DIAGNOSIS — R1032 Left lower quadrant pain: Secondary | ICD-10-CM | POA: Diagnosis not present

## 2023-05-31 DIAGNOSIS — M79605 Pain in left leg: Secondary | ICD-10-CM

## 2023-05-31 MED ORDER — HYDROCODONE-ACETAMINOPHEN 5-325 MG PO TABS: 0.5000 | ORAL_TABLET | Freq: Three times a day (TID) | ORAL | 0 refills | Status: AC | PRN

## 2023-05-31 NOTE — Patient Instructions (Addendum)
We will move forward with vascular test. Can use hydrocodone for pain.. severe.. try to limit use, start with half a tablet, mainly at bedtime if pain keeping up from sleep.

## 2023-05-31 NOTE — Progress Notes (Signed)
Patient ID: [name], female    DOB: 09-08-1941, 82 y.o.   MRN: [id number]  This visit was conducted in person.  BP 118/76 (BP Location: Left Arm, Patient Position: Sitting, Cuff Size: Normal)   Pulse (!) 56   Temp 98 F (36.7 C) (Temporal)   Ht 5\' 6"  (1.676 m)   Wt 131 lb (59.4 kg)   SpO2 98%   BMI 21.14 kg/m    CC:  Chief Complaint  Patient presents with   Hip Pain    Left hip pain x 1-2 years. Pain is starting to now radiate down leg. There is no hx of a fall or injury.     Subjective:   HPI: [name] is a 82 y.o. female presenting on 05/31/2023 for Hip Pain (Left hip pain x 1-2 years. Pain is starting to now radiate down leg. There is no hx of a fall or injury. 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 Also felt possibly secondary to chronic constipation and  At last office visit with myself in June 2024 I treated patient with prednisone taper and referral to physical therapy for possible referred pain from lumbar spine with left sided sciatica.     She reports prednisone did not help at all this time.  Pain does come and go for few days at a time then recurs.  Keeping her up at night.. more pain with  walking, has to rest, also pain with resting or lying down. Pain is in anterior left hip and low abdomen, pain radiated to left lower leg.  No  numbness,  no weakness.  She notes no nausea.. but poo appetite. Doesn't want to eat when leg in pain.   Colonscopy 02/2023 polyps.    She has daily BMs , no constipation. Wt Readings from Last 3 Encounters:  05/31/23 131 lb (59.4 kg)  04/20/23 140 lb (63.5 kg)  04/04/23 131 lb (59.4 kg)      Relevant past medical, surgical, family and social history reviewed and updated as indicated. Interim medical history since our last visit reviewed. Allergies and medications reviewed and updated. Outpatient Medications Prior to Visit  Medication Sig Dispense Refill   acetaminophen (TYLENOL) 325 MG tablet Take 325 mg by mouth every 6 (six) hours as needed for mild pain.     amiodarone (PACERONE) 100 MG tablet Take one tablet 5 days weekly on Monday, Tuesday, Thursday, Friday and Saturday. 25 tablet 1   apixaban (ELIQUIS) 2.5 MG TABS tablet Take 1 tablet (2.5 mg total) by mouth 2 (two) times daily. 180 tablet 3   D 1000 25 MCG (1000 UT) capsule TAKE 1 CAPSULE BY MOUTH AT BEDTIME. 30 capsule 3   donepezil (ARICEPT) 5 MG tablet Take 1 tablet (5 mg total) by mouth at bedtime. 30 tablet 11   doxazosin (CARDURA) 2 MG tablet Take 1 tablet (2 mg total) by mouth daily. 90 tablet 3   hydrALAZINE (APRESOLINE) 50 MG tablet Take 1 tablet (50 mg total) by mouth every 8 (eight) hours as needed. FOR BLOOD PRESSURE ABOVE 150 30 tablet 0   LUMIGAN 0.01 % SOLN Place 1 drop into both eyes at bedtime.     pantoprazole (  PROTONIX) 40 MG tablet TAKE 1 TABLET BY MOUTH ONCE DAILY 30 tablet 11   predniSONE (DELTASONE) 20 MG tablet Take 1 tablet (20 mg total) by mouth daily with breakfast. 15 tablet 0   rosuvastatin (CRESTOR) 10 MG tablet Take 1 tablet (10 mg total) by mouth daily. 90 tablet 3   ondansetron (ZOFRAN) 4 MG tablet Take 1-2 tablets (4-8 mg total) by mouth every 8 (eight) hours as needed for nausea or vomiting. 30 tablet 0   No facility-administered medications prior to visit.     Per HPI unless specifically indicated in ROS section below Review of Systems  Constitutional:  Negative for fatigue and fever.  HENT:  Negative for congestion.   Eyes:  Negative for pain.  Respiratory:  Negative for cough and shortness of breath.   Cardiovascular:  Negative for chest pain, palpitations and leg swelling.   Gastrointestinal:  Negative for abdominal pain.  Genitourinary:  Negative for dysuria and vaginal bleeding.  Musculoskeletal:  Negative for back pain.  Neurological:  Negative for syncope, light-headedness and headaches.  Psychiatric/Behavioral:  Negative for dysphoric mood.    Objective:  BP 118/76 (BP Location: Left Arm, Patient Position: Sitting, Cuff Size: Normal)   Pulse (!) 56   Temp 98 F (36.7 C) (Temporal)   Ht 5\' 6"  (1.676 m)   Wt 131 lb (59.4 kg)   SpO2 98%   BMI 21.14 kg/m   Wt Readings from Last 3 Encounters:  05/31/23 131 lb (59.4 kg)  04/20/23 140 lb (63.5 kg)  04/04/23 131 lb (59.4 kg)      Physical Exam Constitutional:      General: She is not in acute distress.    Appearance: Normal appearance. She is well-developed. She is not ill-appearing or toxic-appearing.  HENT:     Head: Normocephalic.     Right Ear: Hearing, tympanic membrane, ear canal and external ear normal. Tympanic membrane is not erythematous, retracted or bulging.     Left Ear: Hearing, tympanic membrane, ear canal and external ear normal. Tympanic membrane is not erythematous, retracted or bulging.     Nose: No mucosal edema or rhinorrhea.     Right Sinus: No maxillary sinus tenderness or frontal sinus tenderness.     Left Sinus: No maxillary sinus tenderness or frontal sinus tenderness.     Mouth/Throat:     Mouth: Oropharynx is clear and moist and mucous membranes are normal.     Pharynx: Uvula midline.  Eyes:     General: Lids are normal. Lids are everted, no foreign bodies appreciated.     Extraocular Movements: EOM normal.     Conjunctiva/sclera: Conjunctivae normal.     Pupils: Pupils are equal, round, and reactive to light.  Neck:     Thyroid: No thyroid mass or thyromegaly.     Vascular: No carotid bruit.     Trachea: Trachea normal.  Cardiovascular:     Rate and Rhythm: Normal rate and regular rhythm.     Pulses:          Dorsalis pedis pulses are 0 on the left side.        Posterior tibial pulses are 1+ on the left side.     Heart sounds: Normal heart sounds, S1 normal and S2 normal. No murmur heard.    No friction rub. No gallop.  Pulmonary:     Effort: Pulmonary effort is normal. No tachypnea or respiratory distress.     Breath sounds: Normal breath sounds. No decreased breath  sounds, wheezing, rhonchi or rales.  Abdominal:     General: Bowel sounds are normal.     Palpations: Abdomen is soft.     Tenderness: There is no abdominal tenderness.  Musculoskeletal:     Cervical back: Normal range of motion and neck supple.     Lumbar back: No tenderness or bony tenderness. Decreased range of motion. Negative left straight leg raise test.     Left hip: No tenderness. Decreased range of motion.  Skin:    General: Skin is warm, dry and intact.     Findings: No rash.  Neurological:     Mental Status: She is alert.  Psychiatric:        Mood and Affect: Mood is not anxious or depressed.        Speech: Speech normal.        Behavior: Behavior normal. Behavior is cooperative.        Thought Content: Thought content normal.        Cognition and Memory: Cognition and memory normal.        Judgment: Judgment normal.       Results for orders placed or performed in visit on 05/23/23  Lab report - scanned  Result Value Ref Range   EGFR 27.0    Creatinine, POC 109.9 mg/dL   Albumin, Urine POC 426.8    Albumin/Creatinine Ratio, Urine, POC 117     Assessment and Plan  Left leg pain -     VAS [place] ABI WITH/WO TBI; Future  Left groin pain -     VAS [place] ABI WITH/WO TBI; Future  Other orders -     HYDROcodone-Acetaminophen; Take 0.5-1 tablets by mouth every 8 (eight) hours as needed for up to 5 days for moderate pain.  Dispense: 15 tablet; Refill: 0  Chronic left hip/groin pain now with new left leg pain. She has had significant workup of this issue with no discovery of clear cause of pain.  Hip x-rays show no evidence of osteoarthritis. Symptoms are not  consistent with sciatica at this point. In the past symptoms seem to be related to constipation but she is having regular daily bowel movements now. 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 Consider possible vascular cause of pain given now significant pain in left leg with walking improved with rest suggesting claudication.  We will move forward with vascular evaluation. Can use hydrocodone for pain.. severe.. try to limit use, start with half a tablet, mainly at bedtime if pain keeping up from sleep.    Jacqueline Nora, MD

## 2023-06-02 ENCOUNTER — Other Ambulatory Visit: Payer: Self-pay | Admitting: Family Medicine

## 2023-06-02 MED ORDER — ONDANSETRON HCL 4 MG PO TABS: 4.0000 mg | ORAL_TABLET | Freq: Three times a day (TID) | ORAL | 0 refills | Status: DC | PRN

## 2023-06-02 NOTE — Telephone Encounter (Signed)
Prescription Request  06/02/2023  LOV: 05/31/2023  What is the name of the medication or equipment? ondansetron (ZOFRAN) 4 MG tablet, Completely out of medication   Have you contacted your pharmacy to request a refill? No   Which pharmacy would you like this sent to?  [facility] - [place], [place] - [address] [place] [address] Phone: [phone] Fax: [phone]     Patient notified that their request is being sent to the clinical staff for review and that they should receive a response within 2 business days.   Please advise at Mobile [phone] (mobile)

## 2023-06-02 NOTE — Telephone Encounter (Signed)
Last office visit 05/31/2023 for leg/groin pain.  Last refilled 01/12/23 for #30 with no refills.  Next Appt: No future appointments with PCP.

## 2023-06-12 DIAGNOSIS — I4891 Unspecified atrial fibrillation: Secondary | ICD-10-CM | POA: Diagnosis not present

## 2023-06-12 DIAGNOSIS — I129 Hypertensive chronic kidney disease with stage 1 through stage 4 chronic kidney disease, or unspecified chronic kidney disease: Secondary | ICD-10-CM | POA: Diagnosis not present

## 2023-06-12 DIAGNOSIS — I480 Paroxysmal atrial fibrillation: Secondary | ICD-10-CM | POA: Diagnosis not present

## 2023-06-12 DIAGNOSIS — I130 Hypertensive heart and chronic kidney disease with heart failure and stage 1 through stage 4 chronic kidney disease, or unspecified chronic kidney disease: Secondary | ICD-10-CM | POA: Diagnosis not present

## 2023-06-12 DIAGNOSIS — Z7901 Long term (current) use of anticoagulants: Secondary | ICD-10-CM | POA: Diagnosis not present

## 2023-06-12 DIAGNOSIS — N179 Acute kidney failure, unspecified: Secondary | ICD-10-CM | POA: Diagnosis not present

## 2023-06-12 DIAGNOSIS — N1832 Chronic kidney disease, stage 3b: Secondary | ICD-10-CM | POA: Diagnosis not present

## 2023-06-12 DIAGNOSIS — R001 Bradycardia, unspecified: Secondary | ICD-10-CM | POA: Diagnosis not present

## 2023-06-12 DIAGNOSIS — R1084 Generalized abdominal pain: Secondary | ICD-10-CM | POA: Diagnosis not present

## 2023-06-12 DIAGNOSIS — Z79899 Other long term (current) drug therapy: Secondary | ICD-10-CM | POA: Diagnosis not present

## 2023-06-12 DIAGNOSIS — R110 Nausea: Secondary | ICD-10-CM | POA: Diagnosis not present

## 2023-06-12 DIAGNOSIS — M25552 Pain in left hip: Secondary | ICD-10-CM | POA: Diagnosis not present

## 2023-06-12 DIAGNOSIS — I5032 Chronic diastolic (congestive) heart failure: Secondary | ICD-10-CM | POA: Diagnosis not present

## 2023-06-12 DIAGNOSIS — K5900 Constipation, unspecified: Secondary | ICD-10-CM | POA: Diagnosis not present

## 2023-06-13 DIAGNOSIS — N189 Chronic kidney disease, unspecified: Secondary | ICD-10-CM | POA: Diagnosis not present

## 2023-06-13 DIAGNOSIS — K5900 Constipation, unspecified: Secondary | ICD-10-CM | POA: Diagnosis not present

## 2023-06-13 DIAGNOSIS — R001 Bradycardia, unspecified: Secondary | ICD-10-CM | POA: Diagnosis not present

## 2023-06-13 DIAGNOSIS — R109 Unspecified abdominal pain: Secondary | ICD-10-CM | POA: Diagnosis not present

## 2023-06-13 DIAGNOSIS — I130 Hypertensive heart and chronic kidney disease with heart failure and stage 1 through stage 4 chronic kidney disease, or unspecified chronic kidney disease: Secondary | ICD-10-CM | POA: Diagnosis not present

## 2023-06-13 DIAGNOSIS — I480 Paroxysmal atrial fibrillation: Secondary | ICD-10-CM | POA: Diagnosis not present

## 2023-06-13 DIAGNOSIS — E875 Hyperkalemia: Secondary | ICD-10-CM | POA: Diagnosis not present

## 2023-06-13 DIAGNOSIS — K219 Gastro-esophageal reflux disease without esophagitis: Secondary | ICD-10-CM | POA: Diagnosis not present

## 2023-06-13 DIAGNOSIS — I5030 Unspecified diastolic (congestive) heart failure: Secondary | ICD-10-CM | POA: Diagnosis not present

## 2023-06-13 DIAGNOSIS — R112 Nausea with vomiting, unspecified: Secondary | ICD-10-CM | POA: Diagnosis not present

## 2023-06-13 DIAGNOSIS — N179 Acute kidney failure, unspecified: Secondary | ICD-10-CM | POA: Diagnosis not present

## 2023-06-13 DIAGNOSIS — Z79899 Other long term (current) drug therapy: Secondary | ICD-10-CM | POA: Diagnosis not present

## 2023-06-14 DIAGNOSIS — M79605 Pain in left leg: Secondary | ICD-10-CM | POA: Diagnosis not present

## 2023-06-14 DIAGNOSIS — I10 Essential (primary) hypertension: Secondary | ICD-10-CM | POA: Diagnosis not present

## 2023-06-14 DIAGNOSIS — N189 Chronic kidney disease, unspecified: Secondary | ICD-10-CM | POA: Diagnosis not present

## 2023-06-14 DIAGNOSIS — N179 Acute kidney failure, unspecified: Secondary | ICD-10-CM | POA: Diagnosis not present

## 2023-06-14 DIAGNOSIS — R001 Bradycardia, unspecified: Secondary | ICD-10-CM | POA: Diagnosis not present

## 2023-06-14 DIAGNOSIS — M79604 Pain in right leg: Secondary | ICD-10-CM | POA: Diagnosis not present

## 2023-06-18 DIAGNOSIS — M79605 Pain in left leg: Secondary | ICD-10-CM | POA: Insufficient documentation

## 2023-06-22 DIAGNOSIS — H2512 Age-related nuclear cataract, left eye: Secondary | ICD-10-CM | POA: Diagnosis not present

## 2023-06-22 DIAGNOSIS — H401132 Primary open-angle glaucoma, bilateral, moderate stage: Secondary | ICD-10-CM | POA: Diagnosis not present

## 2023-06-23 ENCOUNTER — Ambulatory Visit: Payer: Medicare Other

## 2023-06-28 ENCOUNTER — Other Ambulatory Visit (HOSPITAL_BASED_OUTPATIENT_CLINIC_OR_DEPARTMENT_OTHER): Payer: Self-pay | Admitting: Cardiovascular Disease

## 2023-06-28 ENCOUNTER — Other Ambulatory Visit: Payer: Self-pay | Admitting: Cardiology

## 2023-06-28 MED ORDER — AMIODARONE HCL 100 MG PO TABS: ORAL_TABLET | ORAL | 9 refills | Status: DC

## 2023-07-14 DIAGNOSIS — N1832 Chronic kidney disease, stage 3b: Secondary | ICD-10-CM | POA: Diagnosis not present

## 2023-07-14 DIAGNOSIS — R001 Bradycardia, unspecified: Secondary | ICD-10-CM | POA: Diagnosis not present

## 2023-07-14 DIAGNOSIS — Z09 Encounter for follow-up examination after completed treatment for conditions other than malignant neoplasm: Secondary | ICD-10-CM | POA: Diagnosis not present

## 2023-07-14 DIAGNOSIS — I071 Rheumatic tricuspid insufficiency: Secondary | ICD-10-CM | POA: Diagnosis not present

## 2023-07-14 DIAGNOSIS — I351 Nonrheumatic aortic (valve) insufficiency: Secondary | ICD-10-CM | POA: Diagnosis not present

## 2023-07-14 DIAGNOSIS — I480 Paroxysmal atrial fibrillation: Secondary | ICD-10-CM | POA: Diagnosis not present

## 2023-07-14 DIAGNOSIS — E7849 Other hyperlipidemia: Secondary | ICD-10-CM | POA: Diagnosis not present

## 2023-07-14 DIAGNOSIS — I10 Essential (primary) hypertension: Secondary | ICD-10-CM | POA: Diagnosis not present

## 2023-07-14 DIAGNOSIS — I340 Nonrheumatic mitral (valve) insufficiency: Secondary | ICD-10-CM | POA: Diagnosis not present

## 2023-07-25 ENCOUNTER — Other Ambulatory Visit (HOSPITAL_BASED_OUTPATIENT_CLINIC_OR_DEPARTMENT_OTHER): Payer: Self-pay | Admitting: Cardiovascular Disease

## 2023-08-16 ENCOUNTER — Ambulatory Visit: Payer: Medicare Other | Admitting: Family Medicine

## 2023-08-16 ENCOUNTER — Encounter: Payer: Self-pay | Admitting: Family Medicine

## 2023-08-16 ENCOUNTER — Encounter: Payer: Self-pay | Admitting: *Deleted

## 2023-08-16 VITALS — BP 138/62 | HR 59 | Temp 98.7°F | Ht 66.0 in | Wt 135.0 lb

## 2023-08-16 DIAGNOSIS — N179 Acute kidney failure, unspecified: Secondary | ICD-10-CM | POA: Diagnosis not present

## 2023-08-16 DIAGNOSIS — N1832 Chronic kidney disease, stage 3b: Secondary | ICD-10-CM

## 2023-08-16 DIAGNOSIS — K5900 Constipation, unspecified: Secondary | ICD-10-CM

## 2023-08-16 DIAGNOSIS — N189 Chronic kidney disease, unspecified: Secondary | ICD-10-CM | POA: Diagnosis not present

## 2023-08-16 DIAGNOSIS — M5442 Lumbago with sciatica, left side: Secondary | ICD-10-CM

## 2023-08-16 DIAGNOSIS — D638 Anemia in other chronic diseases classified elsewhere: Secondary | ICD-10-CM

## 2023-08-16 DIAGNOSIS — D631 Anemia in chronic kidney disease: Secondary | ICD-10-CM

## 2023-08-16 LAB — CBC WITH DIFFERENTIAL/PLATELET
Basophils Absolute: 0 10*3/uL (ref 0.0–0.1)
Basophils Relative: 1 % (ref 0.0–3.0)
Eosinophils Absolute: 0 10*3/uL (ref 0.0–0.7)
Eosinophils Relative: 1.1 % (ref 0.0–5.0)
HCT: 32.6 % — ABNORMAL LOW (ref 36.0–46.0)
Hemoglobin: 10.7 g/dL — ABNORMAL LOW (ref 12.0–15.0)
Lymphocytes Relative: 36.3 % (ref 12.0–46.0)
Lymphs Abs: 1.5 10*3/uL (ref 0.7–4.0)
MCHC: 32.8 g/dL (ref 30.0–36.0)
MCV: 98.7 fL (ref 78.0–100.0)
Monocytes Absolute: 0.3 10*3/uL (ref 0.1–1.0)
Monocytes Relative: 7.1 % (ref 3.0–12.0)
Neutro Abs: 2.2 10*3/uL (ref 1.4–7.7)
Neutrophils Relative %: 54.5 % (ref 43.0–77.0)
Platelets: 142 10*3/uL — ABNORMAL LOW (ref 150.0–400.0)
RBC: 3.3 Mil/uL — ABNORMAL LOW (ref 3.87–5.11)
RDW: 13 % (ref 11.5–15.5)
WBC: 4.1 10*3/uL (ref 4.0–10.5)

## 2023-08-16 LAB — BASIC METABOLIC PANEL
BUN: 31 mg/dL — ABNORMAL HIGH (ref 6–23)
CO2: 28 meq/L (ref 19–32)
Calcium: 9.9 mg/dL (ref 8.4–10.5)
Chloride: 104 meq/L (ref 96–112)
Creatinine, Ser: 1.91 mg/dL — ABNORMAL HIGH (ref 0.40–1.20)
GFR: 24.36 mL/min — ABNORMAL LOW (ref >60.00)
Glucose, Bld: 99 mg/dL (ref 70–99)
Potassium: 4.1 meq/L (ref 3.5–5.1)
Sodium: 140 meq/L (ref 135–145)

## 2023-08-16 MED ORDER — PREDNISONE 20 MG PO TABS: ORAL_TABLET | ORAL | 0 refills | Status: DC

## 2023-08-16 NOTE — Progress Notes (Signed)
Patient ID: [name], female    DOB: [birth year], 82 y.o.   MRN: [id number]  This visit was conducted in person.  BP 138/62 (BP Location: Left Arm, Patient Position: Sitting, Cuff Size: Normal)   Pulse (!) 59   Temp 98.7 F (37.1 C) (Oral)   Ht 5\' 6"  (1.676 m)   Wt 135 lb (61.2 kg)   SpO2 99%   BMI 21.79 kg/m    CC:  Chief Complaint  Patient presents with   Hip Pain    X couple of months and states its getting worse. Patient states pain is spreading to her toes. She takes extra strength tylenol but isnt helping much anymore.     Subjective:   HPI: [name] is a 82 y.o. female presenting on 08/16/2023 for Hip Pain (X couple of months and states its getting worse. Patient states pain is spreading to her toes. She takes extra strength tylenol but isnt helping much anymore. )  Chronic left hip pain, worsening over the past few months.  Using extra strength Tylenol as needed for pain. Reviewed ED note from June 12, 2023 for similar pain..  Left lower quadrant pain left hip pain, associated with nausea Found to have acute kidney injury on chronic kidney disease. Held lisinopril/hydrochlorothiazide Creatinine improved prior to discharge from 2.6-1.87 with fluids..  Cause felt to be prerenal Treated for constipation with MiraLAX 3 times daily, senna nightly.   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  Given samples of linzess for constipation  EGD/colo 02/2023 with single polyp in duodenum, and 2 in colon. Non bleeding. Path returned with colonic tubular adenoma, otherwise unremarkable.   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 Per pelvic MR 02/2023, tubular structure has been stable ~ 2 years.  CA 125 checked in January, normal at 17.  -Repeat CA  125, 16  at hospital visit 06/2022  Heme following anemia of chronic disease.. Dr. Smith Robert.. has received iron infusions    Today she  was doing  well for a while after hospital visit   Now pain started back in last month, now pain in left buttock radiating to lef lower leg.  No numbness in leg.  No weakness.  03/2023: Mild multilevel degenerative changes. 3. Possible continued grade 1 anterolisthesis at the L4-5 level. Unable to assess for pars defects.  ABI ordered but not completed   She is keeping up with liquids.   Constipation, better ( Using linzess off and okay I mean miralax off and on) no nausea, last BM was last night, small BM, no straining. Wt Readings from Last 3 Encounters:  08/16/23 135 lb (61.2 kg)  05/31/23 131 lb (59.4 kg)  04/20/23 140 lb (63.5 kg)     Relevant past medical, surgical, family and social history reviewed and updated as indicated. Interim medical history since our last visit reviewed. Allergies and medications reviewed and updated. Outpatient Medications Prior to Visit  Medication Sig Dispense Refill   acetaminophen (TYLENOL) 325 MG tablet Take 325 mg by mouth every 6 (six) hours as needed for mild pain.     amiodarone (PACERONE) 100 MG tablet Take one tablet 5 days weekly on Monday, Tuesday, Thursday, Friday and Saturday. 25 tablet 9   D 1000 25 MCG (1000  UT) capsule TAKE 1 CAPSULE BY MOUTH AT BEDTIME. 30 capsule 3   donepezil (ARICEPT) 5 MG tablet Take 1 tablet (5 mg total) by mouth at bedtime. 30 tablet 11   doxazosin (CARDURA) 2 MG tablet TAKE 1 TABLET BY MOUTH DAILY 90 tablet 3   ELIQUIS 2.5 MG TABS tablet TAKE 1 TABLET BY MOUTH TWICE A DAY 180 tablet 3   hydrALAZINE (APRESOLINE) 50 MG tablet TAKE 1 TABLET BY MOUTH EVERY 8 HOURS. 270 tablet 3   LUMIGAN 0.01 % SOLN Place 1 drop into both eyes at bedtime.     ondansetron (ZOFRAN) 4 MG tablet Take 1-2 tablets (4-8 mg total) by mouth every 8 (eight) hours as needed for nausea or vomiting. 30 tablet 0    pantoprazole (PROTONIX) 40 MG tablet TAKE 1 TABLET BY MOUTH ONCE DAILY 30 tablet 11   predniSONE (DELTASONE) 20 MG tablet Take 1 tablet (20 mg total) by mouth daily with breakfast. 15 tablet 0   rosuvastatin (CRESTOR) 10 MG tablet Take 1 tablet (10 mg total) by mouth daily. 90 tablet 3   No facility-administered medications prior to visit.     Per HPI unless specifically indicated in ROS section below Review of Systems  Constitutional:  Negative for fatigue and fever.  HENT:  Negative for congestion.   Eyes:  Negative for pain.  Respiratory:  Negative for cough and shortness of breath.   Cardiovascular:  Negative for chest pain, palpitations and leg swelling.  Gastrointestinal:  Negative for abdominal pain.  Genitourinary:  Negative for dysuria and vaginal bleeding.  Musculoskeletal:  Positive for arthralgias. Negative for back pain.  Neurological:  Negative for syncope, light-headedness and headaches.  Psychiatric/Behavioral:  Negative for dysphoric mood.    Objective:  BP 138/62 (BP Location: Left Arm, Patient Position: Sitting, Cuff Size: Normal)   Pulse (!) 59   Temp 98.7 F (37.1 C) (Oral)   Ht 5\' 6"  (1.676 m)   Wt 135 lb (61.2 kg)   SpO2 99%   BMI 21.79 kg/m   Wt Readings from Last 3 Encounters:  08/16/23 135 lb (61.2 kg)  05/31/23 131 lb (59.4 kg)  04/20/23 140 lb (63.5 kg)      Physical Exam Constitutional:      General: She is not in acute distress.    Appearance: Normal appearance. She is well-developed. She is not ill-appearing or toxic-appearing.  HENT:     Head: Normocephalic.     Right Ear: Hearing, tympanic membrane, ear canal and external ear normal. Tympanic membrane is not erythematous, retracted or bulging.     Left Ear: Hearing, tympanic membrane, ear canal and external ear normal. Tympanic membrane is not erythematous, retracted or bulging.     Nose: No mucosal edema or rhinorrhea.     Right Sinus: No maxillary sinus tenderness or frontal sinus  tenderness.     Left Sinus: No maxillary sinus tenderness or frontal sinus tenderness.     Mouth/Throat:     Mouth: Oropharynx is clear and moist and mucous membranes are normal.     Pharynx: Uvula midline.  Eyes:     General: Lids are normal. Lids are everted, no foreign bodies appreciated.     Extraocular Movements: EOM normal.     Conjunctiva/sclera: Conjunctivae normal.     Pupils: Pupils are equal, round, and reactive to light.  Neck:     Thyroid: No thyroid mass or thyromegaly.     Vascular: No carotid bruit.     Trachea: Trachea  normal.  Cardiovascular:     Rate and Rhythm: Normal rate and regular rhythm.     Pulses: Normal pulses.     Heart sounds: Normal heart sounds, S1 normal and S2 normal. No murmur heard.    No friction rub. No gallop.  Pulmonary:     Effort: Pulmonary effort is normal. No tachypnea or respiratory distress.     Breath sounds: Normal breath sounds. No decreased breath sounds, wheezing, rhonchi or rales.  Abdominal:     General: Bowel sounds are normal.     Palpations: Abdomen is soft.     Tenderness: There is no abdominal tenderness.  Musculoskeletal:     Cervical back: Normal range of motion and neck supple.     Lumbar back: Tenderness present. No bony tenderness. Decreased range of motion. Positive left straight leg raise test.  Skin:    General: Skin is warm, dry and intact.     Findings: No rash.  Neurological:     Mental Status: She is alert.  Psychiatric:        Mood and Affect: Mood is not anxious or depressed.        Speech: Speech normal.        Behavior: Behavior normal. Behavior is cooperative.        Thought Content: Thought content normal.        Cognition and Memory: Cognition and memory normal.        Judgment: Judgment normal.       Results for orders placed or performed in visit on 08/16/23  Basic Metabolic Panel  Result Value Ref Range   Sodium 140 135 - 145 mEq/L   Potassium 4.1 3.5 - 5.1 mEq/L   Chloride 104 96 - 112  mEq/L   CO2 28 19 - 32 mEq/L   Glucose, Bld 99 70 - 99 mg/dL   BUN 31 (H) 6 - 23 mg/dL   Creatinine, Ser 2.13 (H) 0.40 - 1.20 mg/dL   GFR 08.65 (L) >78.46 mL/min   Calcium 9.9 8.4 - 10.5 mg/dL  CBC with Differential/Platelet  Result Value Ref Range   WBC 4.1 4.0 - 10.5 K/uL   RBC 3.30 (L) 3.87 - 5.11 Mil/uL   Hemoglobin 10.7 (L) 12.0 - 15.0 g/dL   HCT 96.2 (L) 95.2 - 84.1 %   MCV 98.7 78.0 - 100.0 fl   MCHC 32.8 30.0 - 36.0 g/dL   RDW 32.4 40.1 - 02.7 %   Platelets 142.0 (L) 150.0 - 400.0 K/uL   Neutrophils Relative % 54.5 43.0 - 77.0 %   Lymphocytes Relative 36.3 12.0 - 46.0 %   Monocytes Relative 7.1 3.0 - 12.0 %   Eosinophils Relative 1.1 0.0 - 5.0 %   Basophils Relative 1.0 0.0 - 3.0 %   Neutro Abs 2.2 1.4 - 7.7 K/uL   Lymphs Abs 1.5 0.7 - 4.0 K/uL   Monocytes Absolute 0.3 0.1 - 1.0 K/uL   Eosinophils Absolute 0.0 0.0 - 0.7 K/uL   Basophils Absolute 0.0 0.0 - 0.1 K/uL    Assessment and Plan  Anemia of chronic renal failure, stage 3b (HCC) Assessment & Plan: Chronic, followed  hematology   Acute left-sided low back pain with left-sided sciatica Assessment & Plan: Acute, current hip pain most consistent with radiation from back to buttock and down leg.  Consistent with sciatica.  Will treat with prednisone taper.  Use heat and start gentle exercises for the low back. We will move forward with referral to back  specialist.   Orders: -     Ambulatory referral to Orthopedic Surgery  Acute kidney injury superimposed on chronic kidney disease (HCC) -     Basic metabolic panel  Anemia of chronic disease Assessment & Plan:  chronic, no iron deficiency.  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  Patient no longer taking iron orally or infusions.  Will refer back to hematology for further recommendations.   Constipation, unspecified constipation type Assessment & Plan: Chronic,  in the past constipation has contributed to her left hip, back pain and left lower quadrant abdominal pain.  Recommend MiraLAX 17 g p.o. daily to twice daily, increase water and increase physical activity as able.   Other orders -     predniSONE; 3 tabs by mouth daily x 3 days, then 2 tabs by mouth daily x 2 days then 1 tab by mouth daily x 2 days  Dispense: 15 tablet; Refill: 0    No follow-ups on file.   Kerby Nora, MD

## 2023-08-16 NOTE — Patient Instructions (Addendum)
Follow up with Dr. Smith Robert (hematology) for iron deposits  ( hemosiderosis) and to follow up anemia ( [phone])  Complete prednisone taper.  We will move forward with referral to back specialist.   Start home PT, heat.  Tae miralax daily for constipation.  Please stop at the lab to have labs drawn.

## 2023-08-17 ENCOUNTER — Encounter: Payer: Self-pay | Admitting: Family Medicine

## 2023-08-17 DIAGNOSIS — N184 Chronic kidney disease, stage 4 (severe): Secondary | ICD-10-CM | POA: Insufficient documentation

## 2023-08-21 DIAGNOSIS — M5416 Radiculopathy, lumbar region: Secondary | ICD-10-CM | POA: Diagnosis not present

## 2023-08-23 ENCOUNTER — Other Ambulatory Visit (HOSPITAL_BASED_OUTPATIENT_CLINIC_OR_DEPARTMENT_OTHER): Payer: Self-pay | Admitting: Cardiovascular Disease

## 2023-08-23 ENCOUNTER — Inpatient Hospital Stay: Payer: Medicare Other | Attending: Family Medicine

## 2023-08-24 NOTE — Assessment & Plan Note (Signed)
Chronic, in the past constipation has contributed to her left hip, back pain and left lower quadrant abdominal pain.  Recommend MiraLAX 17 g p.o. daily to twice daily, increase water and increase physical activity as able.

## 2023-08-24 NOTE — Assessment & Plan Note (Addendum)
chronic, no iron deficiency.

## 2023-08-24 NOTE — Assessment & Plan Note (Addendum)
Acute, current hip pain most consistent with radiation from back to buttock and down leg.  Consistent with sciatica.  Will treat with prednisone taper.  Use heat and start gentle exercises for the low back. We will move forward with referral to back specialist.

## 2023-08-24 NOTE — Assessment & Plan Note (Signed)
Chronic, followed  hematology

## 2023-08-24 NOTE — Assessment & Plan Note (Signed)
  Acute, noted iron deposits in organs.  Patient no longer taking iron orally or infusions.  Will refer back to hematology for further recommendations.

## 2023-08-30 ENCOUNTER — Inpatient Hospital Stay: Payer: Medicare Other

## 2023-08-31 DIAGNOSIS — M5416 Radiculopathy, lumbar region: Secondary | ICD-10-CM | POA: Diagnosis not present

## 2023-09-04 ENCOUNTER — Inpatient Hospital Stay: Payer: Medicare Other

## 2023-09-04 ENCOUNTER — Ambulatory Visit: Payer: Medicare Other | Attending: Nurse Practitioner

## 2023-09-05 DIAGNOSIS — M5459 Other low back pain: Secondary | ICD-10-CM | POA: Diagnosis not present

## 2023-09-05 DIAGNOSIS — M5416 Radiculopathy, lumbar region: Secondary | ICD-10-CM | POA: Diagnosis not present

## 2023-09-18 DIAGNOSIS — M5416 Radiculopathy, lumbar region: Secondary | ICD-10-CM | POA: Diagnosis not present

## 2023-09-18 DIAGNOSIS — M5459 Other low back pain: Secondary | ICD-10-CM | POA: Diagnosis not present

## 2023-09-20 ENCOUNTER — Inpatient Hospital Stay: Payer: Medicare Other | Attending: Family Medicine

## 2023-09-26 DIAGNOSIS — M5416 Radiculopathy, lumbar region: Secondary | ICD-10-CM | POA: Diagnosis not present

## 2023-09-26 DIAGNOSIS — M5459 Other low back pain: Secondary | ICD-10-CM | POA: Diagnosis not present

## 2023-09-28 DIAGNOSIS — M5416 Radiculopathy, lumbar region: Secondary | ICD-10-CM | POA: Diagnosis not present

## 2023-10-06 DIAGNOSIS — M542 Cervicalgia: Secondary | ICD-10-CM | POA: Diagnosis not present

## 2023-10-06 DIAGNOSIS — M19041 Primary osteoarthritis, right hand: Secondary | ICD-10-CM | POA: Diagnosis not present

## 2023-10-13 DIAGNOSIS — H40033 Anatomical narrow angle, bilateral: Secondary | ICD-10-CM | POA: Diagnosis not present

## 2023-10-19 ENCOUNTER — Other Ambulatory Visit: Payer: Self-pay

## 2023-10-19 DIAGNOSIS — D631 Anemia in chronic kidney disease: Secondary | ICD-10-CM

## 2023-10-20 ENCOUNTER — Inpatient Hospital Stay: Payer: Medicare Other | Attending: Family Medicine | Admitting: Oncology

## 2023-10-20 ENCOUNTER — Inpatient Hospital Stay: Payer: Medicare Other

## 2023-10-20 ENCOUNTER — Encounter: Payer: Self-pay | Admitting: Oncology

## 2023-10-24 ENCOUNTER — Ambulatory Visit: Payer: Medicare Other | Attending: Family Medicine

## 2023-11-21 ENCOUNTER — Other Ambulatory Visit: Payer: Self-pay | Admitting: Family Medicine

## 2023-11-21 ENCOUNTER — Other Ambulatory Visit (HOSPITAL_BASED_OUTPATIENT_CLINIC_OR_DEPARTMENT_OTHER): Payer: Self-pay | Admitting: Cardiovascular Disease

## 2023-11-21 NOTE — Telephone Encounter (Signed)
Please schedule CPE with fasting labs prior for Dr. Ermalene Searing.

## 2023-11-21 NOTE — Telephone Encounter (Signed)
Not able to lvm but sent mychart message for patient to contact office and schedule.

## 2023-11-23 DIAGNOSIS — N2581 Secondary hyperparathyroidism of renal origin: Secondary | ICD-10-CM | POA: Diagnosis not present

## 2023-11-23 DIAGNOSIS — R0602 Shortness of breath: Secondary | ICD-10-CM | POA: Diagnosis not present

## 2023-11-23 DIAGNOSIS — N184 Chronic kidney disease, stage 4 (severe): Secondary | ICD-10-CM | POA: Diagnosis not present

## 2023-11-23 DIAGNOSIS — I129 Hypertensive chronic kidney disease with stage 1 through stage 4 chronic kidney disease, or unspecified chronic kidney disease: Secondary | ICD-10-CM | POA: Diagnosis not present

## 2023-11-23 DIAGNOSIS — N3289 Other specified disorders of bladder: Secondary | ICD-10-CM | POA: Diagnosis not present

## 2023-11-23 DIAGNOSIS — E785 Hyperlipidemia, unspecified: Secondary | ICD-10-CM | POA: Diagnosis not present

## 2023-11-24 LAB — LAB REPORT - SCANNED
Albumin, Urine POC: 123.2
Calcium: 10.1
Creatinine, POC: 107.4 mg/dL
EGFR: 28
Microalb Creat Ratio: 115

## 2023-11-28 DIAGNOSIS — H401122 Primary open-angle glaucoma, left eye, moderate stage: Secondary | ICD-10-CM | POA: Diagnosis not present

## 2023-11-28 DIAGNOSIS — H2512 Age-related nuclear cataract, left eye: Secondary | ICD-10-CM | POA: Diagnosis not present

## 2023-11-28 DIAGNOSIS — I480 Paroxysmal atrial fibrillation: Secondary | ICD-10-CM | POA: Diagnosis not present

## 2023-11-28 DIAGNOSIS — I129 Hypertensive chronic kidney disease with stage 1 through stage 4 chronic kidney disease, or unspecified chronic kidney disease: Secondary | ICD-10-CM | POA: Diagnosis not present

## 2023-11-28 DIAGNOSIS — N189 Chronic kidney disease, unspecified: Secondary | ICD-10-CM | POA: Diagnosis not present

## 2023-11-28 DIAGNOSIS — H4010X2 Unspecified open-angle glaucoma, moderate stage: Secondary | ICD-10-CM | POA: Diagnosis not present

## 2023-11-28 DIAGNOSIS — I4891 Unspecified atrial fibrillation: Secondary | ICD-10-CM | POA: Diagnosis not present

## 2023-11-28 DIAGNOSIS — Z7901 Long term (current) use of anticoagulants: Secondary | ICD-10-CM | POA: Diagnosis not present

## 2023-11-28 DIAGNOSIS — Z79899 Other long term (current) drug therapy: Secondary | ICD-10-CM | POA: Diagnosis not present

## 2023-12-11 DIAGNOSIS — M5416 Radiculopathy, lumbar region: Secondary | ICD-10-CM | POA: Diagnosis not present

## 2023-12-29 ENCOUNTER — Ambulatory Visit: Admitting: Family Medicine

## 2024-01-03 DIAGNOSIS — M5416 Radiculopathy, lumbar region: Secondary | ICD-10-CM | POA: Diagnosis not present

## 2024-01-03 DIAGNOSIS — M48061 Spinal stenosis, lumbar region without neurogenic claudication: Secondary | ICD-10-CM | POA: Diagnosis not present

## 2024-01-12 ENCOUNTER — Ambulatory Visit: Admitting: Family Medicine

## 2024-01-12 ENCOUNTER — Encounter: Payer: Self-pay | Admitting: Family Medicine

## 2024-01-12 VITALS — BP 124/80 | HR 47 | Temp 97.3°F | Ht 66.0 in | Wt 132.0 lb

## 2024-01-12 DIAGNOSIS — K5909 Other constipation: Secondary | ICD-10-CM

## 2024-01-12 DIAGNOSIS — D638 Anemia in other chronic diseases classified elsewhere: Secondary | ICD-10-CM | POA: Diagnosis not present

## 2024-01-12 DIAGNOSIS — M5416 Radiculopathy, lumbar region: Secondary | ICD-10-CM | POA: Diagnosis not present

## 2024-01-12 DIAGNOSIS — R110 Nausea: Secondary | ICD-10-CM | POA: Diagnosis not present

## 2024-01-12 DIAGNOSIS — N184 Chronic kidney disease, stage 4 (severe): Secondary | ICD-10-CM | POA: Diagnosis not present

## 2024-01-12 DIAGNOSIS — K219 Gastro-esophageal reflux disease without esophagitis: Secondary | ICD-10-CM

## 2024-01-12 NOTE — Patient Instructions (Addendum)
 Stop tramadol. Use tylenol arthritis up to three a day.  Can increase the zofran to 2 tabs as needed  three times a day.  Only use muscle relaxant at night if needed.   If not improving with stopping the tramadol in a few days.Marland Kitchen start OTC prilosec 20 mg daily   Make follow up appt with Dr. Smith Robert.. ask about labs.  Hematology/Oncology Dr.RAO. [facility]  [phone] [phone]

## 2024-01-12 NOTE — Progress Notes (Signed)
 Patient ID: [name], female    DOB: 1941-06-10, 83 y.o.   MRN: [id number]  This visit was conducted in person.  BP 124/80 (BP Location: Left Arm, Patient Position: Sitting, Cuff Size: Normal)   Pulse (!) 47   Temp (!) 97.3 F (36.3 C) (Temporal)   Ht 5\' 6"  (1.676 m)   Wt 132 lb (59.9 kg)   SpO2 100%   BMI 21.31 kg/m    CC:  Chief Complaint  Patient presents with   Nausea   Vomiting    Subjective:   HPI: [name] is a 83 y.o. female patient with history of hypertension, persistent atrial fibrillation, moderate dementia, reflux, and chronic constipation.  Presenting on 01/12/2024 for Nausea and Vomiting  She presents with her husband today who helps provide history.  Chronic nausea had improved until last week. Was eating well She reports  decreased appetite, nausea.  In last 2  had emesis Able  to keep down water  , gingerale, but even Ensure causes nausea. She has been using zofran  prn... helps some. She is having daily BM, sometimes twice a day...  using ClearLlax   She has history of chronic left hip pain, chronic nausea and chronic constipation. No improvement in the past with PPI.  Given samples of Linzess for constipation by GI in the past.  At last office visit with me August 16, 2019 for constipation had improved with MiraLAX off and on, Not sure even making her nausea. if she tried Linzess.  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 Per pelvic MR 02/2023, tubular structure has been stable ~ 2 years.  CA 125 checked in January, normal at 17.  -Repeat CA 125, 16  at hospital visit 06/2022   Last endoscopy and colonoscopy Feb 28, 2023 Colonoscopy unremarkable colon polyps removed. Upper endoscopy: Single duodenal polyp biopsied, normal stomach and normal GE junction and esophagus.  All biopsies negative for malignancy Dr. Baldomero Bone  In November I gave her a prednisone  taper for lumbar radiculopathy.   She has been referred to back specialist [facility] Dr. Alan All  Planning steroid injections.  Started on methocarbamol using 1-2 times a day and tramadol  every 8 hours in last week.. not always taking on full stomach.  Meds have not helped  back.   She has been seeing hematology Dr. Randy Buttery for anemia..  Hemoglobin was stable at last check in November... She is overdue for follow-up and lab reevaluation.     Relevant past medical, surgical, family and social history reviewed and updated as indicated. Interim medical history since our last visit reviewed. Allergies and medications reviewed and updated. Outpatient Medications Prior to Visit  Medication Sig Dispense Refill   acetaminophen  (TYLENOL ) 325 MG tablet Take 325 mg by mouth every 6 (six) hours as needed for mild pain.     amiodarone  (PACERONE ) 200 MG tablet Take 200 mg by mouth. Monday, Tuesday, Thursday, Friday and Saturday     D 1000 25 MCG (1000 UT) capsule TAKE 1 CAPSULE BY MOUTH AT BEDTIME. 30 capsule 3   donepezil  (ARICEPT ) 5 MG tablet TAKE 1 TABLET BY MOUTH AT BEDTIME 30 tablet 2   doxazosin  (CARDURA ) 2 MG tablet TAKE 1 TABLET BY MOUTH DAILY 90 tablet 3   ELIQUIS  2.5 MG TABS tablet TAKE 1 TABLET BY MOUTH TWICE A DAY 180 tablet 3   hydrALAZINE  (APRESOLINE ) 50 MG tablet TAKE 1 TABLET BY MOUTH EVERY 8 HOURS. 270 tablet 3  lisinopril-hydrochlorothiazide  (ZESTORETIC) 10-12.5 MG tablet TAKE 1 TABLET BY MOUTH DAILY 90 tablet 3   LUMIGAN 0.01 % SOLN Place 1 drop into both eyes at bedtime.     methocarbamol (ROBAXIN) 500 MG tablet Take 500 mg by mouth 3 (three) times daily.     ondansetron  (ZOFRAN ) 4 MG tablet Take 1-2 tablets (4-8 mg total) by mouth every 8 (eight) hours as needed for nausea or vomiting. 30 tablet 0   pantoprazole  (PROTONIX ) 40 MG tablet TAKE 1 TABLET BY MOUTH ONCE DAILY 30 tablet 11   rosuvastatin  (CRESTOR ) 10 MG tablet TAKE 1 TABLET BY MOUTH DAILY 90 tablet 3   traMADol  (ULTRAM ) 50 MG tablet Take 50 mg by mouth 3  (three) times daily as needed.     amiodarone  (PACERONE ) 100 MG tablet Take one tablet 5 days weekly on Monday, Tuesday, Thursday, Friday and Saturday. 25 tablet 9   predniSONE  (DELTASONE ) 20 MG tablet 3 tabs by mouth daily x 3 days, then 2 tabs by mouth daily x 2 days then 1 tab by mouth daily x 2 days 15 tablet 0   No facility-administered medications prior to visit.     Per HPI unless specifically indicated in ROS section below Review of Systems  Constitutional:  Negative for fatigue and fever.  HENT:  Negative for congestion.   Eyes:  Negative for pain.  Respiratory:  Negative for cough and shortness of breath.   Cardiovascular:  Negative for chest pain, palpitations and leg swelling.  Gastrointestinal:  Positive for nausea. Negative for abdominal pain and vomiting.  Genitourinary:  Negative for dysuria and vaginal bleeding.  Musculoskeletal:  Negative for back pain.  Neurological:  Negative for syncope, light-headedness and headaches.  Psychiatric/Behavioral:  Negative for dysphoric mood.    Objective:  BP 124/80 (BP Location: Left Arm, Patient Position: Sitting, Cuff Size: Normal)   Pulse (!) 47   Temp (!) 97.3 F (36.3 C) (Temporal)   Ht 5\' 6"  (1.676 m)   Wt 132 lb (59.9 kg)   SpO2 100%   BMI 21.31 kg/m   Wt Readings from Last 3 Encounters:  01/12/24 132 lb (59.9 kg)  08/16/23 135 lb (61.2 kg)  05/31/23 131 lb (59.4 kg)      Physical Exam Constitutional:      General: She is not in acute distress.    Appearance: Normal appearance. She is well-developed. She is not ill-appearing or toxic-appearing.  HENT:     Head: Normocephalic.     Right Ear: Hearing, tympanic membrane, ear canal and external ear normal. Tympanic membrane is not erythematous, retracted or bulging.     Left Ear: Hearing, tympanic membrane, ear canal and external ear normal. Tympanic membrane is not erythematous, retracted or bulging.     Nose: No mucosal edema or rhinorrhea.     Right Sinus: No  maxillary sinus tenderness or frontal sinus tenderness.     Left Sinus: No maxillary sinus tenderness or frontal sinus tenderness.     Mouth/Throat:     Pharynx: Uvula midline.  Eyes:     General: Lids are normal. Lids are everted, no foreign bodies appreciated.     Conjunctiva/sclera: Conjunctivae normal.     Pupils: Pupils are equal, round, and reactive to light.  Neck:     Thyroid : No thyroid  mass or thyromegaly.     Vascular: No carotid bruit.     Trachea: Trachea normal.  Cardiovascular:     Rate and Rhythm: Normal rate and regular rhythm.  Pulses: Normal pulses.     Heart sounds: Normal heart sounds, S1 normal and S2 normal. No murmur heard.    No friction rub. No gallop.  Pulmonary:     Effort: Pulmonary effort is normal. No tachypnea or respiratory distress.     Breath sounds: Normal breath sounds. No decreased breath sounds, wheezing, rhonchi or rales.  Abdominal:     General: Bowel sounds are normal.     Palpations: Abdomen is soft.     Tenderness: There is no abdominal tenderness.  Musculoskeletal:     Cervical back: Normal range of motion and neck supple.     Lumbar back: Tenderness present. No bony tenderness. Decreased range of motion. Negative right straight leg raise test and negative left straight leg raise test.  Skin:    General: Skin is warm and dry.     Findings: No rash.  Neurological:     Mental Status: She is alert.  Psychiatric:        Mood and Affect: Mood is not anxious or depressed.        Speech: Speech normal.        Behavior: Behavior normal. Behavior is cooperative.        Thought Content: Thought content normal.        Judgment: Judgment normal.       Results for orders placed or performed in visit on 12/01/23  Lab report - scanned   Collection Time: 11/23/23 12:00 AM  Result Value Ref Range   Microalb Creat Ratio 115    Creatinine, POC 107.4 mg/dL   Albumin, Urine POC 409.8    EGFR 28.0    Calcium  10.1     Assessment and  Plan  Chronic nausea Assessment & Plan: Chronic, unclear cause Status post unremarkable colonoscopy and endoscopy. Possibly due to medication side effect. Stop tramadol .  Can increase the zofran  to 2 tabs as needed  three times a day.   If not improving with stopping the tramadol  in a few days.Aaron Aas start OTC prilosec 20 mg daily   Anemia of chronic disease Assessment & Plan: Chronic, followed by hematology Dr. Randy Buttery in the past.  She is overdue for follow-up with Dr. Randy Buttery and is due for lab reevaluation.  Encouraged her to schedule this.   CKD (chronic kidney disease) stage 4, GFR 15-29 ml/min (HCC)  Gastroesophageal reflux disease without esophagitis  Lumbar radiculopathy Assessment & Plan: Chronic, Recent office visit with [facility] Dr. Krasinski. Tramadol  possibly causing acute worsening of chronic nausea.  She is not taking it on a full stomach. Given the methocarbamol and tramadol  have not helped her back we will stop this.   Only use muscle relaxant at night if needed. For pain she can use tylenol  arthritis up to three a day. Has upcoming planned steroid injections.   Chronic constipation Assessment & Plan: Chronic, much better controlled now with daily mouth movement,sometimes twice a day, using ClearLax regularly.     No follow-ups on file.   Herby Lolling, MD

## 2024-01-16 DIAGNOSIS — I10 Essential (primary) hypertension: Secondary | ICD-10-CM | POA: Diagnosis not present

## 2024-01-16 DIAGNOSIS — I480 Paroxysmal atrial fibrillation: Secondary | ICD-10-CM | POA: Diagnosis not present

## 2024-01-16 DIAGNOSIS — R001 Bradycardia, unspecified: Secondary | ICD-10-CM | POA: Diagnosis not present

## 2024-01-16 DIAGNOSIS — N1832 Chronic kidney disease, stage 3b: Secondary | ICD-10-CM | POA: Diagnosis not present

## 2024-01-16 DIAGNOSIS — E7849 Other hyperlipidemia: Secondary | ICD-10-CM | POA: Diagnosis not present

## 2024-01-16 DIAGNOSIS — I351 Nonrheumatic aortic (valve) insufficiency: Secondary | ICD-10-CM | POA: Diagnosis not present

## 2024-01-16 DIAGNOSIS — I071 Rheumatic tricuspid insufficiency: Secondary | ICD-10-CM | POA: Diagnosis not present

## 2024-01-16 DIAGNOSIS — I340 Nonrheumatic mitral (valve) insufficiency: Secondary | ICD-10-CM | POA: Diagnosis not present

## 2024-01-26 DIAGNOSIS — M5416 Radiculopathy, lumbar region: Secondary | ICD-10-CM | POA: Diagnosis not present

## 2024-02-05 DIAGNOSIS — M5416 Radiculopathy, lumbar region: Secondary | ICD-10-CM | POA: Insufficient documentation

## 2024-02-05 NOTE — Assessment & Plan Note (Signed)
 Chronic, followed by hematology Dr. Randy Buttery in the past.  She is overdue for follow-up with Dr. Randy Buttery and is due for lab reevaluation.  Encouraged her to schedule this.

## 2024-02-05 NOTE — Assessment & Plan Note (Addendum)
 Chronic, unclear cause Status post unremarkable colonoscopy and endoscopy. Possibly due to medication side effect. Stop tramadol .  Can increase the zofran  to 2 tabs as needed  three times a day.   If not improving with stopping the tramadol  in a few days.[name] start OTC prilosec 20 mg daily

## 2024-02-05 NOTE — Assessment & Plan Note (Addendum)
 Chronic, Recent office visit with [facility] Dr. Krasinski. Tramadol  possibly causing acute worsening of chronic nausea.  She is not taking it on a full stomach. Given the methocarbamol and tramadol  have not helped her back we will stop this.   Only use muscle relaxant at night if needed. For pain she can use tylenol  arthritis up to three a day. Has upcoming planned steroid injections.

## 2024-02-05 NOTE — Assessment & Plan Note (Signed)
 Chronic, much better controlled now with daily mouth movement,sometimes twice a day, using ClearLax regularly.

## 2024-02-09 ENCOUNTER — Other Ambulatory Visit: Payer: Self-pay | Admitting: Family Medicine

## 2024-02-14 ENCOUNTER — Encounter: Payer: Self-pay | Admitting: Oncology

## 2024-02-14 ENCOUNTER — Inpatient Hospital Stay: Attending: Oncology

## 2024-02-14 ENCOUNTER — Inpatient Hospital Stay: Admitting: Oncology

## 2024-02-14 VITALS — BP 125/53 | HR 61 | Temp 96.6°F | Resp 18 | Ht 66.0 in | Wt 133.8 lb

## 2024-02-14 DIAGNOSIS — D509 Iron deficiency anemia, unspecified: Secondary | ICD-10-CM

## 2024-02-14 DIAGNOSIS — D631 Anemia in chronic kidney disease: Secondary | ICD-10-CM

## 2024-02-14 DIAGNOSIS — N1832 Chronic kidney disease, stage 3b: Secondary | ICD-10-CM

## 2024-02-14 LAB — IRON AND TIBC
Iron: 64 ug/dL (ref 28–170)
Saturation Ratios: 22 % (ref 10.4–31.8)
TIBC: 286 ug/dL (ref 250–450)
UIBC: 222 ug/dL

## 2024-02-14 LAB — BASIC METABOLIC PANEL - CANCER CENTER ONLY
Anion gap: 9 (ref 5–15)
BUN: 31 mg/dL — ABNORMAL HIGH (ref 8–23)
CO2: 22 mmol/L (ref 22–32)
Calcium: 9.5 mg/dL (ref 8.9–10.3)
Chloride: 109 mmol/L (ref 98–111)
Creatinine: 2.17 mg/dL — ABNORMAL HIGH (ref 0.44–1.00)
GFR, Estimated: 22 mL/min — ABNORMAL LOW (ref >60)
Glucose, Bld: 115 mg/dL — ABNORMAL HIGH (ref 70–99)
Potassium: 4.1 mmol/L (ref 3.5–5.1)
Sodium: 140 mmol/L (ref 135–145)

## 2024-02-14 LAB — CBC (CANCER CENTER ONLY)
HCT: 29.8 % — ABNORMAL LOW (ref 36.0–46.0)
Hemoglobin: 9.9 g/dL — ABNORMAL LOW (ref 12.0–15.0)
MCH: 32 pg (ref 26.0–34.0)
MCHC: 33.2 g/dL (ref 30.0–36.0)
MCV: 96.4 fL (ref 80.0–100.0)
Platelet Count: 145 10*3/uL — ABNORMAL LOW (ref 150–400)
RBC: 3.09 MIL/uL — ABNORMAL LOW (ref 3.87–5.11)
RDW: 12.6 % (ref 11.5–15.5)
WBC Count: 6 10*3/uL (ref 4.0–10.5)
nRBC: 0 % (ref 0.0–0.2)

## 2024-02-14 LAB — FERRITIN: Ferritin: 294 ng/mL (ref 11–307)

## 2024-02-14 NOTE — Addendum Note (Signed)
 Addended by: Marilyn Shropshire on: 02/14/2024 03:17 PM   Modules accepted: Orders

## 2024-02-14 NOTE — Progress Notes (Signed)
 Hematology/Oncology Consult note [facility]  [phone] [phone]  Patient Care Team: Judithann Novas, MD as PCP - General Maudine Sos, MD as PCP - Cardiology (Cardiology) Lei Pump, MD as PCP - Electrophysiology (Cardiology) Clair Crews, MD as Referring Physician (Ophthalmology) Rochell Chroman, RN as Oncology Nurse Navigator Avonne Boettcher, MD as Consulting Physician (Oncology)   Name of the patient: [name]  [id number]  05/17/1941   Date of visit: 02/14/24  Diagnosis-anemia of chronic kidney disease with a component of iron deficiency  Chief complaint/ Reason for visit-routine follow-up of anemia  Heme/Onc history: patient is a 83 year old female with a past medical history significant for atrial fibrillation, paroxysmal supraventricular tachycardia, hypertension, coronary artery disease referred for anemia.  Her most recent CBC with differential from 11/11/2020 showed a white count of 5.4, H&H of 10.6/32.1 with an MCV of 92 and a platelet count of 183.  Looking back at her prior CBCs patient has always had a normal white count and a platelet count and her hemoglobin has typically remained between 10-11 since 2009.  Patient recently had iron studies, TSH and B12 checked on 11/11/2020 which was within normal limits.     Patient does have chronic kidney disease but has not required any EPO so far    Interval history-patient has baseline fatigue.  No recent hospitalizations.  Denies any blood loss in her stool or urine.  ECOG PS- 1 Pain scale- 0  Review of systems- Review of Systems  Constitutional:  Positive for malaise/fatigue. Negative for chills, fever and weight loss.  HENT:  Negative for congestion, ear discharge and nosebleeds.   Eyes:  Negative for blurred vision.  Respiratory:  Negative for cough, hemoptysis, sputum production, shortness of breath and wheezing.   Cardiovascular:  Negative for  chest pain, palpitations, orthopnea and claudication.  Gastrointestinal:  Negative for abdominal pain, blood in stool, constipation, diarrhea, heartburn, melena, nausea and vomiting.  Genitourinary:  Negative for dysuria, flank pain, frequency, hematuria and urgency.  Musculoskeletal:  Negative for back pain, joint pain and myalgias.  Skin:  Negative for rash.  Neurological:  Negative for dizziness, tingling, focal weakness, seizures, weakness and headaches.  Endo/Heme/Allergies:  Does not bruise/bleed easily.  Psychiatric/Behavioral:  Negative for depression and suicidal ideas. The patient does not have insomnia.       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 cath 2/09: no CAD, EF 70%   GERD (gastroesophageal reflux disease) 07/26/2021   HYPERLIPIDEMIA    HYPERTENSION    Hypertensive urgency 07/19/2022   Mild Aortic insufficiency    Moderate mitral regurgitation    Moderate tricuspid regurgitation    OSTEOPENIA    PAF (paroxysmal atrial fibrillation) (HCC)    a. s/p ablation 2013; b. CHADS2VASc -> 4 (HTN, age x 2, female)-->Eliquis .   PAT (paroxysmal atrial tachycardia) (HCC)    a. 04/2019 Zio: Occas PACs and rare PVCs. 21 atrial runs - longest 20 beats, max rate 169.   Pneumonia 2009   RA (rheumatoid arthritis) (HCC)    Sinus Bradycardia  a. asymptomatic but prevents use of AVN blocking agents; b. 04/2019 Zio: Avg HR 61 (37-109).   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 05/2019 Echo: EF 60-65%. DD. RVSP 41.59mmHg. Mod dil LA. Mod MR/TR. Mild AI.     Past Surgical History:   Procedure Laterality Date   ABLATION OF DYSRHYTHMIC FOCUS     CARDIAC CATHETERIZATION     COLONOSCOPY  04/18/08   COLONOSCOPY WITH PROPOFOL  N/A 02/28/2023   Procedure: COLONOSCOPY WITH PROPOFOL ;  Surgeon: Selena Daily, MD;  Location: [facility] ENDOSCOPY;  Service: Gastroenterology;  Laterality: N/A;   ESOPHAGOGASTRODUODENOSCOPY (EGD) WITH PROPOFOL  N/A 02/28/2023   Procedure: ESOPHAGOGASTRODUODENOSCOPY (EGD) WITH PROPOFOL ;  Surgeon: Selena Daily, MD;  Location: [facility] ENDOSCOPY;  Service: Gastroenterology;  Laterality: N/A;   Partial hysterectomy--1979     Thoracentesis   12/20/07      Social History   Socioeconomic History   Marital status: Married    Spouse name: Not on file   Number of children: Not on file   Years of education: Not on file   Highest education level: Not on file  Occupational History   Not on file  Tobacco Use   Smoking status: Never   Smokeless tobacco: Never  Vaping Use   Vaping status: Never Used  Substance and Sexual Activity   Alcohol use: No    Alcohol/week: 0.0 standard drinks of alcohol   Drug use: No   Sexual activity: Not Currently    Birth control/protection: None  Other Topics Concern   Not on file  Social History Narrative   Marital Status: widow x 1 yrChildren: 5, grandchildren 80, numerous great grand 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 Full Code ( reviewed 2015) Occasional exercise.Diet: fruits and veggies, lean meats.   Right handed    Caffeine- none    Social Drivers of Health   Financial Resource Strain: Low Risk  (04/20/2023)   Overall Financial Resource Strain (CARDIA)    Difficulty of Paying Living Expenses: Not hard at all  Food Insecurity: No Food  Insecurity (04/20/2023)   Hunger Vital Sign    Worried About Running Out of Food in the Last Year: Never true    Ran Out of Food in the Last Year: Never true  Transportation Needs: No Transportation Needs (04/20/2023)   PRAPARE - Administrator, Civil Service (Medical): No    Lack of Transportation (Non-Medical): No  Physical Activity: Inactive (04/20/2023)   Exercise Vital Sign    Days of Exercise per Week: 0 days    Minutes of Exercise per Session: 0 min  Stress: No Stress Concern Present (04/20/2023)   [employer] of Occupational Health - Occupational Stress Questionnaire    Feeling of Stress : Not at all  Social Connections: Moderately Integrated (04/20/2023)   Social Connection and Isolation Panel [NHANES]    Frequency of Communication with Friends and Family: More than three times a week    Frequency of Social Gatherings with Friends and Family: More than three times a week    Attends Religious Services: More than 4 times per year    Active Member of [employer] or Organizations: No    Attends Banker Meetings: Never    Marital Status: Married  Catering manager Violence: Not At Risk (04/20/2023)   Humiliation, Afraid, Rape,  and Kick questionnaire    Fear of Current or Ex-Partner: No    Emotionally Abused: No    Physically Abused: No    Sexually Abused: No    Family History  Problem Relation Age of Onset   Diabetes Other    Breast cancer Paternal Aunt 87   Heart disease Mother    Pancreatic cancer Father    Atrial fibrillation Brother    Heart disease Brother    Heart attack Brother      Current Outpatient Medications:    SIMBRINZA 1-0.2 % SUSP, Apply 1 drop to eye 3 (three) times daily., Disp: , Rfl:    acetaminophen  (TYLENOL ) 325 MG tablet, Take 325 mg by mouth every 6 (six) hours as needed for mild pain., Disp: , Rfl:    amiodarone  (PACERONE ) 200 MG tablet, Take 200 mg by mouth. Monday, Tuesday, Thursday, Friday and Saturday, Disp: , Rfl:    D  1000 25 MCG (1000 UT) capsule, TAKE 1 CAPSULE BY MOUTH AT BEDTIME., Disp: 30 capsule, Rfl: 3   donepezil  (ARICEPT ) 5 MG tablet, TAKE 1 TABLET BY MOUTH AT BEDTIME, Disp: 30 tablet, Rfl: 2   doxazosin  (CARDURA ) 2 MG tablet, TAKE 1 TABLET BY MOUTH DAILY, Disp: 90 tablet, Rfl: 3   ELIQUIS  2.5 MG TABS tablet, TAKE 1 TABLET BY MOUTH TWICE A DAY, Disp: 180 tablet, Rfl: 3   hydrALAZINE  (APRESOLINE ) 50 MG tablet, TAKE 1 TABLET BY MOUTH EVERY 8 HOURS., Disp: 270 tablet, Rfl: 3   HYDROcodone -acetaminophen  (NORCO/VICODIN) 5-325 MG tablet, , Disp: , Rfl:    lisinopril-hydrochlorothiazide  (ZESTORETIC) 10-12.5 MG tablet, TAKE 1 TABLET BY MOUTH DAILY, Disp: 90 tablet, Rfl: 3   LUMIGAN 0.01 % SOLN, Place 1 drop into both eyes at bedtime., Disp: , Rfl:    methocarbamol (ROBAXIN) 500 MG tablet, Take 500 mg by mouth 3 (three) times daily., Disp: , Rfl:    metoprolol  tartrate (LOPRESSOR ) 25 MG tablet, , Disp: , Rfl:    Na Sulfate-K Sulfate-Mg Sulfate concentrate (SUPREP) 17.5-3.13-1.6 GM/177ML SOLN, , Disp: , Rfl:    omeprazole (PRILOSEC OTC) 20 MG tablet, Take 1 tablet by mouth daily., Disp: , Rfl:    ondansetron  (ZOFRAN ) 4 MG tablet, Take 1-2 tablets (4-8 mg total) by mouth every 8 (eight) hours as needed for nausea or vomiting., Disp: 30 tablet, Rfl: 0   pantoprazole  (PROTONIX ) 40 MG tablet, TAKE 1 TABLET BY MOUTH ONCE DAILY, Disp: 30 tablet, Rfl: 11   rosuvastatin  (CRESTOR ) 10 MG tablet, TAKE 1 TABLET BY MOUTH DAILY, Disp: 90 tablet, Rfl: 3   tiZANidine (ZANAFLEX) 4 MG tablet, Take 1 tablet by mouth every 8 (eight) hours., Disp: , Rfl:    traMADol  (ULTRAM ) 50 MG tablet, Take 50 mg by mouth 3 (three) times daily as needed. (Patient not taking: Reported on 02/14/2024), Disp: , Rfl:   Physical exam:  Vitals:   02/14/24 1449  BP: (!) 125/53  Pulse: 61  Resp: 18  Temp: (!) 96.6 F (35.9 C)  TempSrc: Tympanic  SpO2: 99%  Weight: 133 lb 12.8 oz (60.7 kg)  Height: 5\' 6"  (1.676 m)   Physical Exam Cardiovascular:      Rate and Rhythm: Normal rate and regular rhythm.     Heart sounds: Normal heart sounds.  Pulmonary:     Effort: Pulmonary effort is normal.     Breath sounds: Normal breath sounds.  Skin:    General: Skin is warm and dry.  Neurological:     Mental Status: She is  alert and oriented to person, place, and time.      I have personally reviewed labs listed below:    Latest Ref Rng & Units 02/14/2024    2:28 PM  CMP  Glucose 70 - 99 mg/dL 272   BUN 8 - 23 mg/dL 31   Creatinine 5.36 - 1.00 mg/dL 6.44   Sodium 034 - 742 mmol/L 140   Potassium 3.5 - 5.1 mmol/L 4.1   Chloride 98 - 111 mmol/L 109   CO2 22 - 32 mmol/L 22   Calcium  8.9 - 10.3 mg/dL 9.5       Latest Ref Rng & Units 02/14/2024    2:28 PM  CBC  WBC 4.0 - 10.5 K/uL 6.0   Hemoglobin 12.0 - 15.0 g/dL 9.9   Hematocrit 59.5 - 46.0 % 29.8   Platelets 150 - 400 K/uL 145      Assessment and plan- Patient is a 83 y.o. female here for routine follow-up of anemia of chronic kidney disease  Patient's hemoglobin has remained stable between 1.5-10.5 over the last 2 years.  She received IV iron in February 2024 and in May 2024 when she had [name] of the abdomen it showed diffuse iron deposition which was likely secondary to preceding IV iron treatment.  Today her hemoglobin remains stable around 9.9.  Iron studies are currently pending.  Goal is to keep her ferritin close to 100 and iron saturation close to 20%.  She has not required any EPO so far.  CBC ferritin and iron studies in 6 months in 1 year and I will see her back in 1 year   Visit Diagnosis 1. Iron deficiency anemia, unspecified iron deficiency anemia type   2. Anemia of chronic renal failure, stage 3b (HCC)      Dr. Seretha Dance, MD, MPH [facility] [phone] 02/14/2024 3:10 PM

## 2024-02-27 ENCOUNTER — Ambulatory Visit: Payer: Self-pay | Admitting: Family Medicine

## 2024-02-27 ENCOUNTER — Encounter: Payer: Self-pay | Admitting: Family Medicine

## 2024-02-27 ENCOUNTER — Ambulatory Visit (INDEPENDENT_AMBULATORY_CARE_PROVIDER_SITE_OTHER): Admitting: Family Medicine

## 2024-02-27 VITALS — BP 118/60 | HR 54 | Temp 97.3°F | Ht 66.0 in | Wt 132.2 lb

## 2024-02-27 DIAGNOSIS — R110 Nausea: Secondary | ICD-10-CM | POA: Diagnosis not present

## 2024-02-27 DIAGNOSIS — R1013 Epigastric pain: Secondary | ICD-10-CM | POA: Diagnosis not present

## 2024-02-27 DIAGNOSIS — F5104 Psychophysiologic insomnia: Secondary | ICD-10-CM | POA: Diagnosis not present

## 2024-02-27 DIAGNOSIS — M48061 Spinal stenosis, lumbar region without neurogenic claudication: Secondary | ICD-10-CM | POA: Insufficient documentation

## 2024-02-27 DIAGNOSIS — R351 Nocturia: Secondary | ICD-10-CM | POA: Diagnosis not present

## 2024-02-27 LAB — CBC WITH DIFFERENTIAL/PLATELET
Basophils Absolute: 0 10*3/uL (ref 0.0–0.1)
Basophils Relative: 0.8 % (ref 0.0–3.0)
Eosinophils Absolute: 0.1 10*3/uL (ref 0.0–0.7)
Eosinophils Relative: 2.3 % (ref 0.0–5.0)
HCT: 28.5 % — ABNORMAL LOW (ref 36.0–46.0)
Hemoglobin: 9.7 g/dL — ABNORMAL LOW (ref 12.0–15.0)
Lymphocytes Relative: 25.3 % (ref 12.0–46.0)
Lymphs Abs: 1.2 10*3/uL (ref 0.7–4.0)
MCHC: 34 g/dL (ref 30.0–36.0)
MCV: 96.3 fl (ref 78.0–100.0)
Monocytes Absolute: 0.3 10*3/uL (ref 0.1–1.0)
Monocytes Relative: 7.2 % (ref 3.0–12.0)
Neutro Abs: 3 10*3/uL (ref 1.4–7.7)
Neutrophils Relative %: 64.4 % (ref 43.0–77.0)
Platelets: 130 10*3/uL — ABNORMAL LOW (ref 150.0–400.0)
RBC: 2.96 Mil/uL — ABNORMAL LOW (ref 3.87–5.11)
RDW: 13 % (ref 11.5–15.5)
WBC: 4.7 10*3/uL (ref 4.0–10.5)

## 2024-02-27 LAB — COMPREHENSIVE METABOLIC PANEL WITH GFR
ALT: 9 U/L (ref 0–35)
AST: 18 U/L (ref 0–37)
Albumin: 4.3 g/dL (ref 3.5–5.2)
Alkaline Phosphatase: 46 U/L (ref 39–117)
BUN: 29 mg/dL — ABNORMAL HIGH (ref 6–23)
CO2: 25 meq/L (ref 19–32)
Calcium: 9.5 mg/dL (ref 8.4–10.5)
Chloride: 107 meq/L (ref 96–112)
Creatinine, Ser: 1.99 mg/dL — ABNORMAL HIGH (ref 0.40–1.20)
GFR: 23.11 mL/min — ABNORMAL LOW (ref >60.00)
Glucose, Bld: 113 mg/dL — ABNORMAL HIGH (ref 70–99)
Potassium: 3.8 meq/L (ref 3.5–5.1)
Sodium: 140 meq/L (ref 135–145)
Total Bilirubin: 0.7 mg/dL (ref 0.2–1.2)
Total Protein: 7.1 g/dL (ref 6.0–8.3)

## 2024-02-27 LAB — LIPASE: Lipase: 47 U/L (ref 11.0–59.0)

## 2024-02-27 MED ORDER — SUCRALFATE 1 G PO TABS: 1.0000 g | ORAL_TABLET | Freq: Three times a day (TID) | ORAL | 0 refills | Status: DC

## 2024-02-27 NOTE — Progress Notes (Addendum)
 Patient ID: [name], female    DOB: Mar 27, 1941, 83 y.o.   MRN: [id number]  This visit was conducted in person.  BP 118/60   Pulse (!) 54   Temp (!) 97.3 F (36.3 C) (Temporal)   Ht 5\' 6"  (1.676 m)   Wt 132 lb 4 oz (60 kg)   SpO2 97%   BMI 21.35 kg/m    CC:  Chief Complaint  Patient presents with   Abdominal Pain   Decrease Appetite   Insomnia    Subjective:   HPI: [name] is a 83 y.o. female patient with history of hypertension, persistent atrial fibrillation, moderate dementia, reflux, and chronic constipation.  Presenting on 02/27/2024 for Abdominal Pain, Decrease Appetite, and Insomnia  She presents with her husband today who helps provide history.  Chronic nausea and abdominal pain intermittent.  Was eating well She reports  decreased appetite, nausea.    She has history of chronic left hip pain, chronic nausea and chronic constipation. No improvement in the past with PPI.  Given samples of Linzess for constipation by GI in the past.  At last office visit with me August 16, 2019 for constipation had improved with MiraLAX off and on, Not sure even making her nausea. if she tried Linzess.  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 Per pelvic MR 02/2023, tubular structure has been stable ~ 2 years.  CA 125 checked in January, normal at 17.  -Repeat CA 125, 16  at hospital visit 06/2022   Last endoscopy and colonoscopy Feb 28, 2023 Colonoscopy unremarkable colon polyps removed. Upper endoscopy: Single duodenal polyp biopsied, normal stomach and normal GE junction and esophagus.  All biopsies negative for malignancy Dr. Baldomero Bone   At last office visit with me January 12, 2024 felt possible cause of nausea was medication side effect.  Stop methocarbamol and tramadol  and increased Zofran  to 2 tablets 3 times a day as needed.  She has been seeing hematology Dr. Randy Buttery for anemia..  Reviewed last labs with Dr. Randy Buttery  hemoglobin 9.9 platelets 145  , normal iron panel. Per hematology iron deposition likely secondary to the preceding iron IV treatment. If instability she was to follow-up in 1 year.  Reviewed recent cardiology [facility] with [facility] Atrial fibrillation with history of ablation 2013 Palpitations Sinus bradycardia - With very intermittent palpitations. Last Zio patch 05/2023 showed PACs but no atrial fibrillation. - She is bradycardic at baseline, currently on amiodarone  100 mg daily. Continue this for now. Hold on beta-blockade given bradycardia.  Hypertension - Controlled. Continue hydralazine  50 mg 3 times daily. - Developed AKI last visit with lisinopril/HCTZ in the setting of poor p.o. intake. Would not retrial moving forward.   Today she reports issues with sleeping at night and continue chronic nausea, intermittent abdominal pain.  Nausea got better with holding pain meds.  She is mentioning more abd pain in last week. Some acid in mouth, burning in stomach.  Husband is not sure she is taking  pantoprazole .  Weight is stable, no weight loss.  Occ napping during the day, trouble sleeping at night, waking up at night. 2-3 time at night. Relevant past medical, surgical, family and social history reviewed and updated as indicated. Interim medical history since our last visit reviewed. Allergies and medications reviewed and updated. Outpatient Medications Prior to Visit  Medication Sig Dispense Refill   acetaminophen  (TYLENOL ) 325 MG tablet Take 325 mg by mouth every 6 (six)  hours as needed for mild pain.     amiodarone  (PACERONE ) 200 MG tablet Take 200 mg by mouth. Monday, Tuesday, Thursday, Friday and Saturday     D 1000 25 MCG (1000 UT) capsule TAKE 1 CAPSULE BY MOUTH AT BEDTIME. 30 capsule 3   donepezil  (ARICEPT ) 5 MG tablet TAKE 1 TABLET BY MOUTH AT BEDTIME 30 tablet 2   doxazosin  (CARDURA ) 2 MG tablet TAKE 1 TABLET BY MOUTH DAILY 90 tablet 3   ELIQUIS  2.5 MG TABS tablet TAKE 1 TABLET BY  MOUTH TWICE A DAY 180 tablet 3   hydrALAZINE  (APRESOLINE ) 50 MG tablet TAKE 1 TABLET BY MOUTH EVERY 8 HOURS. 270 tablet 3   lisinopril-hydrochlorothiazide  (ZESTORETIC) 10-12.5 MG tablet TAKE 1 TABLET BY MOUTH DAILY 90 tablet 3   LUMIGAN 0.01 % SOLN Place 1 drop into both eyes at bedtime.     methocarbamol (ROBAXIN) 500 MG tablet Take 500 mg by mouth 3 (three) times daily.     metoprolol  tartrate (LOPRESSOR ) 25 MG tablet      omeprazole (PRILOSEC OTC) 20 MG tablet Take 1 tablet by mouth daily.     ondansetron  (ZOFRAN ) 4 MG tablet Take 1-2 tablets (4-8 mg total) by mouth every 8 (eight) hours as needed for nausea or vomiting. 30 tablet 0   pantoprazole  (PROTONIX ) 40 MG tablet TAKE 1 TABLET BY MOUTH ONCE DAILY 30 tablet 11   rosuvastatin  (CRESTOR ) 10 MG tablet TAKE 1 TABLET BY MOUTH DAILY 90 tablet 3   SIMBRINZA 1-0.2 % SUSP Apply 1 drop to eye 3 (three) times daily.     tiZANidine (ZANAFLEX) 4 MG tablet Take 1 tablet by mouth every 8 (eight) hours.     HYDROcodone -acetaminophen  (NORCO/VICODIN) 5-325 MG tablet      Na Sulfate-K Sulfate-Mg Sulfate concentrate (SUPREP) 17.5-3.13-1.6 GM/177ML SOLN      traMADol  (ULTRAM ) 50 MG tablet Take 50 mg by mouth 3 (three) times daily as needed. (Patient not taking: Reported on 02/14/2024)     No facility-administered medications prior to visit.     Per HPI unless specifically indicated in ROS section below Review of Systems  Constitutional:  Negative for fatigue and fever.  HENT:  Negative for congestion.   Eyes:  Negative for pain.  Respiratory:  Negative for cough and shortness of breath.   Cardiovascular:  Negative for chest pain, palpitations and leg swelling.  Gastrointestinal:  Positive for nausea. Negative for abdominal pain and vomiting.  Genitourinary:  Negative for dysuria and vaginal bleeding.  Musculoskeletal:  Negative for back pain.  Neurological:  Negative for syncope, light-headedness and headaches.  Psychiatric/Behavioral:  Negative for  dysphoric mood.    Objective:  BP 118/60   Pulse (!) 54   Temp (!) 97.3 F (36.3 C) (Temporal)   Ht 5\' 6"  (1.676 m)   Wt 132 lb 4 oz (60 kg)   SpO2 97%   BMI 21.35 kg/m   Wt Readings from Last 3 Encounters:  02/27/24 132 lb 4 oz (60 kg)  02/14/24 133 lb 12.8 oz (60.7 kg)  01/12/24 132 lb (59.9 kg)      Physical Exam Constitutional:      General: She is not in acute distress.    Appearance: Normal appearance. She is well-developed. She is not ill-appearing or toxic-appearing.  HENT:     Head: Normocephalic.     Right Ear: Hearing, tympanic membrane, ear canal and external ear normal. Tympanic membrane is not erythematous, retracted or bulging.     Left Ear:  Hearing, tympanic membrane, ear canal and external ear normal. Tympanic membrane is not erythematous, retracted or bulging.     Nose: No mucosal edema or rhinorrhea.     Right Sinus: No maxillary sinus tenderness or frontal sinus tenderness.     Left Sinus: No maxillary sinus tenderness or frontal sinus tenderness.     Mouth/Throat:     Pharynx: Uvula midline.  Eyes:     General: Lids are normal. Lids are everted, no foreign bodies appreciated.     Conjunctiva/sclera: Conjunctivae normal.     Pupils: Pupils are equal, round, and reactive to light.  Neck:     Thyroid : No thyroid  mass or thyromegaly.     Vascular: No carotid bruit.     Trachea: Trachea normal.  Cardiovascular:     Rate and Rhythm: Normal rate and regular rhythm.     Pulses: Normal pulses.     Heart sounds: Normal heart sounds, S1 normal and S2 normal. No murmur heard.    No friction rub. No gallop.  Pulmonary:     Effort: Pulmonary effort is normal. No tachypnea or respiratory distress.     Breath sounds: Normal breath sounds. No decreased breath sounds, wheezing, rhonchi or rales.  Abdominal:     General: Bowel sounds are normal.     Palpations: Abdomen is soft.     Tenderness: There is abdominal tenderness in the epigastric area. There is no right  CVA tenderness or left CVA tenderness.  Musculoskeletal:     Cervical back: Normal range of motion and neck supple.     Lumbar back: Tenderness present. No bony tenderness. Decreased range of motion. Negative right straight leg raise test and negative left straight leg raise test.  Skin:    General: Skin is warm and dry.     Findings: No rash.  Neurological:     Mental Status: She is alert.  Psychiatric:        Mood and Affect: Mood is not anxious or depressed.        Speech: Speech normal.        Behavior: Behavior normal. Behavior is cooperative.        Thought Content: Thought content normal.        Judgment: Judgment normal.       Results for orders placed or performed in visit on 02/27/24  Comprehensive metabolic panel with GFR   Collection Time: 02/27/24  3:41 PM  Result Value Ref Range   Sodium 140 135 - 145 mEq/L   Potassium 3.8 3.5 - 5.1 mEq/L   Chloride 107 96 - 112 mEq/L   CO2 25 19 - 32 mEq/L   Glucose, Bld 113 (H) 70 - 99 mg/dL   BUN 29 (H) 6 - 23 mg/dL   Creatinine, Ser 1.61 (H) 0.40 - 1.20 mg/dL   Total Bilirubin 0.7 0.2 - 1.2 mg/dL   Alkaline Phosphatase 46 39 - 117 U/L   AST 18 0 - 37 U/L   ALT 9 0 - 35 U/L   Total Protein 7.1 6.0 - 8.3 g/dL   Albumin 4.3 3.5 - 5.2 g/dL   GFR 09.60 (L) >45.40 mL/min   Calcium  9.5 8.4 - 10.5 mg/dL  Lipase   Collection Time: 02/27/24  3:41 PM  Result Value Ref Range   Lipase 47.0 11.0 - 59.0 U/L  CBC with Differential/Platelet   Collection Time: 02/27/24  3:41 PM  Result Value Ref Range   WBC 4.7 4.0 - 10.5 K/uL  RBC 2.96 (L) 3.87 - 5.11 Mil/uL   Hemoglobin 9.7 (L) 12.0 - 15.0 g/dL   HCT 16.1 (L) 09.6 - 04.5 %   MCV 96.3 78.0 - 100.0 fl   MCHC 34.0 30.0 - 36.0 g/dL   RDW 40.9 81.1 - 91.4 %   Platelets 130.0 (L) 150.0 - 400.0 K/uL   Neutrophils Relative % 64.4 43.0 - 77.0 %   Lymphocytes Relative 25.3 12.0 - 46.0 %   Monocytes Relative 7.2 3.0 - 12.0 %   Eosinophils Relative 2.3 0.0 - 5.0 %   Basophils Relative  0.8 0.0 - 3.0 %   Neutro Abs 3.0 1.4 - 7.7 K/uL   Lymphs Abs 1.2 0.7 - 4.0 K/uL   Monocytes Absolute 0.3 0.1 - 1.0 K/uL   Eosinophils Absolute 0.1 0.0 - 0.7 K/uL   Basophils Absolute 0.0 0.0 - 0.1 K/uL    Assessment and Plan  Epigastric pain -     Comprehensive metabolic panel with GFR -     Lipase -     CBC with Differential/Platelet  Nocturia  Chronic insomnia  Chronic nausea  Other orders -     Sucralfate; Take 1 tablet (1 g total) by mouth with breakfast, with lunch, and with evening meal.  Dispense: 90 tablet; Refill: 0  This is a complicated patient with dementia who is a poor historian who presents with chronic intermittent nausea and abdominal pain.  It appears some of her current abdominal pain may be different and more in the epigastric region.  We will evaluate with labs to make sure there is no sign of gallstone, liver issue, pancreatitis.  No clear suggestion of infection. Symptoms in the past seem to be most consistent with constipation and were lower down.  Current symptoms may be due to gastritis, reflux or peptic ulcer disease.  She did have a normal endoscopy showing only polyps last year. Will treat with continue PPI (husband will make sure she is taking pantoprazole  40 mg daily) and start Carafate.  She also continues to have issues with chronic insomnia.  She states that the urinary frequency is very bothersome to her.  If this continues we could could try a trial of oxybutynin at night to help with urinary frequency without daytime sedation.  Additional option would be trial of Remeron to increase appetite and help with sleep.  Husband would like to minimize medications if possible at this time as he feels that all her medication on an empty stomach is likely contributing to his stomach pain.   Return if symptoms worsen or fail to improve.   Herby Lolling, MD

## 2024-02-27 NOTE — Patient Instructions (Addendum)
 Start carafate to help coat stomach.  Make sure taking protonix  40 mg daily.  Take medication on full stomach if able.

## 2024-03-15 ENCOUNTER — Other Ambulatory Visit: Payer: Self-pay | Admitting: Family Medicine

## 2024-03-15 MED ORDER — ONDANSETRON HCL 4 MG PO TABS: 4.0000 mg | ORAL_TABLET | Freq: Three times a day (TID) | ORAL | 0 refills | Status: DC | PRN

## 2024-03-15 NOTE — Telephone Encounter (Signed)
 Last office visit 02/27/2024 for epigastric pain, nausea, nocturia and chronic insomnia.  Last refilled 06/02/2023 for #30 with no refills.  Next Appt: No future appointments with PCP.

## 2024-03-15 NOTE — Telephone Encounter (Signed)
 Copied from CRM [id number]. Topic: Clinical - Medication Refill >> Mar 15, 2024  9:40 AM Freya Jesus wrote: Medication: ondansetron  (ZOFRAN ) 4 MG tablet [[id number]]  Has the patient contacted their pharmacy? No (Agent: If no, request that the patient contact the pharmacy for the refill. If patient does not wish to contact the pharmacy document the reason why and proceed with request.) (Agent: If yes, when and what did the pharmacy advise?)  This is the patient's preferred pharmacy:  [facility] - [place], [place] - [address] [place] [address] Phone: [phone] Fax: [phone]   Is this the correct pharmacy for this prescription? Yes If no, delete pharmacy and type the correct one.   Has the prescription been filled recently? No  Is the patient out of the medication? Yes  Has the patient been seen for an appointment in the last year OR does the patient have an upcoming appointment? Yes  Can we respond through MyChart? Yes  Agent: Please be advised that Rx refills may take up to 3 business days. We ask that you follow-up with your pharmacy.

## 2024-04-01 ENCOUNTER — Telehealth: Payer: Self-pay | Admitting: Family Medicine

## 2024-04-01 MED ORDER — SUCRALFATE 1 G PO TABS
1.0000 g | ORAL_TABLET | Freq: Three times a day (TID) | ORAL | 0 refills | Status: DC
Start: 2024-04-01 — End: 2024-06-04

## 2024-04-01 MED ORDER — ONDANSETRON HCL 4 MG PO TABS: 4.0000 mg | ORAL_TABLET | Freq: Three times a day (TID) | ORAL | 0 refills | Status: DC | PRN

## 2024-04-01 NOTE — Telephone Encounter (Signed)
 Copied from CRM [id number]. Topic: Clinical - Medication Question >> Apr 01, 2024  9:13 AM Aleatha C wrote: Reason for CRM: Patient called to put in a refill but wasn't sure of the medication name, she says it is something for coating her stomach would like a call back to confirm the medication so she can have it refilled

## 2024-04-01 NOTE — Addendum Note (Signed)
 Addended by: WENDELL ARLAND RAMAN on: 04/01/2024 12:15 PM   Modules accepted: Orders

## 2024-04-01 NOTE — Telephone Encounter (Signed)
 Spoke with Mr and Mrs Alkema.  They are requesting a refill on the Sucralfate  1 mg.  They would also like to have a refill on file for the ondansetron  4 mg as well.   Last refilled: Sucralfate  02/27/2024 for #90 with no refills.  Ondansetron  03/15/2024 for #30 with no refills.    Next Appt: No future appointments with PCP.    Refills sent as requested.  Okay per Dr. Watt.

## 2024-04-08 ENCOUNTER — Other Ambulatory Visit: Payer: Self-pay | Admitting: Cardiology

## 2024-04-10 DIAGNOSIS — M48061 Spinal stenosis, lumbar region without neurogenic claudication: Secondary | ICD-10-CM | POA: Diagnosis not present

## 2024-04-10 DIAGNOSIS — M5416 Radiculopathy, lumbar region: Secondary | ICD-10-CM | POA: Diagnosis not present

## 2024-05-04 ENCOUNTER — Other Ambulatory Visit: Payer: Self-pay | Admitting: Family Medicine

## 2024-05-06 NOTE — Telephone Encounter (Signed)
 Spoke to pt, sch cpe for 07/09/24. Pt requested same day labs.

## 2024-05-06 NOTE — Telephone Encounter (Signed)
 Please schedule CPE with fasting labs prior after her MWV scheduled on 07/05/2024.

## 2024-05-10 DIAGNOSIS — M5416 Radiculopathy, lumbar region: Secondary | ICD-10-CM | POA: Diagnosis not present

## 2024-05-22 ENCOUNTER — Other Ambulatory Visit
Admission: RE | Admit: 2024-05-22 | Discharge: 2024-05-22 | Disposition: A | Discharge status: Home / Self Care | Source: Ambulatory Visit | Attending: Ophthalmology | Admitting: Ophthalmology

## 2024-05-22 DIAGNOSIS — G4452 New daily persistent headache (NDPH): Secondary | ICD-10-CM | POA: Insufficient documentation

## 2024-05-22 DIAGNOSIS — H35413 Lattice degeneration of retina, bilateral: Secondary | ICD-10-CM | POA: Diagnosis not present

## 2024-05-22 DIAGNOSIS — Z961 Presence of intraocular lens: Secondary | ICD-10-CM | POA: Diagnosis not present

## 2024-05-22 DIAGNOSIS — H401132 Primary open-angle glaucoma, bilateral, moderate stage: Secondary | ICD-10-CM | POA: Diagnosis not present

## 2024-05-22 LAB — C-REACTIVE PROTEIN: CRP: <0.5 mg/dL (ref <1.0)

## 2024-05-22 LAB — SEDIMENTATION RATE: Sed Rate: 37 mm/h — ABNORMAL HIGH (ref 0–30)

## 2024-05-22 LAB — CBC
HCT: 28.9 % — ABNORMAL LOW (ref 36.0–46.0)
Hemoglobin: 9.1 g/dL — ABNORMAL LOW (ref 12.0–15.0)
MCH: 30.5 pg (ref 26.0–34.0)
MCHC: 31.5 g/dL (ref 30.0–36.0)
MCV: 97 fL (ref 80.0–100.0)
Platelets: 163 K/uL (ref 150–400)
RBC: 2.98 MIL/uL — ABNORMAL LOW (ref 3.87–5.11)
RDW: 13.6 % (ref 11.5–15.5)
WBC: 6 K/uL (ref 4.0–10.5)
nRBC: 0 % (ref 0.0–0.2)

## 2024-05-23 ENCOUNTER — Other Ambulatory Visit: Payer: Self-pay | Admitting: Family Medicine

## 2024-05-23 MED ORDER — ONDANSETRON HCL 4 MG PO TABS: 4.0000 mg | ORAL_TABLET | Freq: Three times a day (TID) | ORAL | 0 refills | Status: AC | PRN

## 2024-05-23 NOTE — Telephone Encounter (Signed)
 Copied from CRM [id number]. Topic: Clinical - Medication Refill >> May 23, 2024  9:55 AM Drema MATSU wrote: Medication: ondansetron  (ZOFRAN ) 4 MG tablet  Has the patient contacted their pharmacy? No (Agent: If no, request that the patient contact the pharmacy for the refill. If patient does not wish to contact the pharmacy document the reason why and proceed with request.) (Agent: If yes, when and what did the pharmacy advise?)  This is the patient's preferred pharmacy:  [facility] - [place], [place] - [address] [place] [address] Phone: [phone] Fax: [phone]  [facility] [address] [place] [address] Phone: [phone] Fax: [phone]  Is this the correct pharmacy for this prescription? Yes If no, delete pharmacy and type the correct one.   Has the prescription been filled recently? No  Is the patient out of the medication? Yes  Has the patient been seen for an appointment in the last year OR does the patient have an upcoming appointment? Yes  Can we respond through MyChart? No  Agent: Please be advised that Rx refills may take up to 3 business days. We ask that you follow-up with your pharmacy.

## 2024-05-23 NOTE — Telephone Encounter (Signed)
 Last office visit 02/27/2024 for Epigastric Pain.  Last refilled 04/01/2024 for #30 with no refills.  Next appt:CPE 07/09/2024.

## 2024-05-27 ENCOUNTER — Other Ambulatory Visit: Payer: Self-pay | Admitting: Family Medicine

## 2024-05-27 MED ORDER — ACETAMINOPHEN 325 MG PO TABS: 325.0000 mg | ORAL_TABLET | Freq: Four times a day (QID) | ORAL | 0 refills | Status: AC | PRN

## 2024-05-27 MED ORDER — OMEPRAZOLE MAGNESIUM 20 MG PO TBEC: 20.0000 mg | DELAYED_RELEASE_TABLET | Freq: Every day | ORAL | 0 refills | Status: DC

## 2024-05-27 NOTE — Telephone Encounter (Signed)
 Copied from CRM #[id number]. Topic: Clinical - Medication Refill >> May 27, 2024  3:41 PM Mercedes MATSU wrote: Medication:  omeprazole  (PRILOSEC  OTC) 20 MG tablet acetaminophen  (TYLENOL ) 325 MG tablet   methocarbamol  (ROBAXIN ) 500 MG tablet    Has the patient contacted their pharmacy? Yes (Agent: If no, request that the patient contact the pharmacy for the refill. If patient does not wish to contact the pharmacy document the reason why and proceed with request.) (Agent: If yes, when and what did the pharmacy advise?)  This is the patient's preferred pharmacy:  [facility] - [place], [place] - [address] [place] [address] Phone: [phone] Fax: [phone]   Is this the correct pharmacy for this prescription? Yes If no, delete pharmacy and type the correct one.   Has the prescription been filled recently? Yes  Is the patient out of the medication? Yes  Has the patient been seen for an appointment in the last year OR does the patient have an upcoming appointment? Yes  Can we respond through MyChart? Yes  Agent: Please be advised that Rx refills may take up to 3 business days. We ask that you follow-up with your pharmacy.

## 2024-05-27 NOTE — Telephone Encounter (Signed)
 Last office visit 02/27/2024 for epigastric pain.  Methocarbamol  listed as historical medication.  Next Appt: 07/09/24 for CPE.

## 2024-05-28 MED ORDER — METHOCARBAMOL 500 MG PO TABS: 500.0000 mg | ORAL_TABLET | Freq: Three times a day (TID) | ORAL | 0 refills | Status: AC | PRN

## 2024-06-01 DIAGNOSIS — M5136 Other intervertebral disc degeneration, lumbar region with discogenic back pain only: Secondary | ICD-10-CM | POA: Diagnosis not present

## 2024-06-01 DIAGNOSIS — R200 Anesthesia of skin: Secondary | ICD-10-CM | POA: Diagnosis not present

## 2024-06-01 DIAGNOSIS — Z7901 Long term (current) use of anticoagulants: Secondary | ICD-10-CM | POA: Diagnosis not present

## 2024-06-01 DIAGNOSIS — M5134 Other intervertebral disc degeneration, thoracic region: Secondary | ICD-10-CM | POA: Diagnosis not present

## 2024-06-01 DIAGNOSIS — R202 Paresthesia of skin: Secondary | ICD-10-CM | POA: Diagnosis not present

## 2024-06-01 DIAGNOSIS — M51372 Other intervertebral disc degeneration, lumbosacral region with discogenic back pain and lower extremity pain: Secondary | ICD-10-CM | POA: Diagnosis not present

## 2024-06-01 DIAGNOSIS — M4316 Spondylolisthesis, lumbar region: Secondary | ICD-10-CM | POA: Diagnosis not present

## 2024-06-01 DIAGNOSIS — M545 Low back pain, unspecified: Secondary | ICD-10-CM | POA: Diagnosis not present

## 2024-06-01 DIAGNOSIS — M5030 Other cervical disc degeneration, unspecified cervical region: Secondary | ICD-10-CM | POA: Diagnosis not present

## 2024-06-01 DIAGNOSIS — I10 Essential (primary) hypertension: Secondary | ICD-10-CM | POA: Diagnosis not present

## 2024-06-01 DIAGNOSIS — E785 Hyperlipidemia, unspecified: Secondary | ICD-10-CM | POA: Diagnosis not present

## 2024-06-01 DIAGNOSIS — Z79899 Other long term (current) drug therapy: Secondary | ICD-10-CM | POA: Diagnosis not present

## 2024-06-04 ENCOUNTER — Other Ambulatory Visit: Payer: Self-pay | Admitting: Cardiology

## 2024-06-04 ENCOUNTER — Encounter: Payer: Self-pay | Admitting: Family Medicine

## 2024-06-04 ENCOUNTER — Other Ambulatory Visit: Payer: Self-pay | Admitting: Family Medicine

## 2024-06-04 ENCOUNTER — Ambulatory Visit (INDEPENDENT_AMBULATORY_CARE_PROVIDER_SITE_OTHER): Admitting: Family Medicine

## 2024-06-04 VITALS — BP 90/60 | HR 56 | Temp 98.0°F | Ht 66.0 in | Wt 126.1 lb

## 2024-06-04 DIAGNOSIS — M48061 Spinal stenosis, lumbar region without neurogenic claudication: Secondary | ICD-10-CM | POA: Diagnosis not present

## 2024-06-04 DIAGNOSIS — G301 Alzheimer's disease with late onset: Secondary | ICD-10-CM | POA: Diagnosis not present

## 2024-06-04 DIAGNOSIS — R110 Nausea: Secondary | ICD-10-CM

## 2024-06-04 DIAGNOSIS — M5442 Lumbago with sciatica, left side: Secondary | ICD-10-CM

## 2024-06-04 DIAGNOSIS — R634 Abnormal weight loss: Secondary | ICD-10-CM | POA: Insufficient documentation

## 2024-06-04 DIAGNOSIS — F02B0 Dementia in other diseases classified elsewhere, moderate, without behavioral disturbance, psychotic disturbance, mood disturbance, and anxiety: Secondary | ICD-10-CM

## 2024-06-04 MED ORDER — SUCRALFATE 1 G PO TABS: 1.0000 g | ORAL_TABLET | Freq: Three times a day (TID) | ORAL | 3 refills | Status: DC

## 2024-06-04 NOTE — Progress Notes (Signed)
 Patient ID: [name], female    DOB: 06-22-1941, 83 y.o.   MRN: [id number]  This visit was conducted in person.  BP 90/60   Pulse (!) 56   Temp 98 F (36.7 C) (Temporal)   Ht 5' 6 (1.676 m)   Wt 126 lb 2 oz (57.2 kg)   SpO2 98%   BMI 20.36 kg/m    CC:  Chief Complaint  Patient presents with   Follow-up    06/01/24 for Low Back Pain    Subjective:   HPI: [name] is a 83 y.o. female presenting on 06/04/2024 for Follow-up (06/01/24 for Low Back Pain)  Recently seen at [facility] emergency room for acute flare of chronic back pain. CT scan showed evidence of degenerative disc disease, anterolisthesis at L4-L5, unchanged from previous imaging 03/30/2023.  Recommended Tylenol  1000 mg every 8 hours as needed for pain.  Recommended lidocaine  patches as needed.  Given Robaxin  muscle relaxer to use as needed. Referred to [facility] orthopedics for further recommendations  Dr. Kathlynn 9/3 At [place]  Today she reports back feel okay, Yesterday was a bad day. Pain keeping her in bed. 10/10 pain.    Lidocaine  patches seem to have helped. Was able to sleep last night .SABRA All night.. first time she was able to sleep.   In the past she has been seen by Dr. Etta at [facility] for spinal stenosis in the lumbar region, lumbar radiculopathy and degenerative lumbar disease. Had epidural steroid injection April 2025 and May 10, 2024.  Did not help at all.  Daughter notes she has lost a lot of weight   She has no appetite per daughter. Chronic nausea... carafate  helped some in the past... needs refill   6 lb weight loss. Wt Readings from Last 3 Encounters:  06/04/24 126 lb 2 oz (57.2 kg)  02/27/24 132 lb 4 oz (60 kg)  02/14/24 133 lb 12.8 oz (60.7 kg)   She does feels somewhat down at times. Lost sister recently.  Relevant past medical, surgical, family and social history reviewed and updated as indicated. Interim medical history since our last visit reviewed. Allergies  and medications reviewed and updated. Outpatient Medications Prior to Visit  Medication Sig Dispense Refill   acetaminophen  (TYLENOL ) 325 MG tablet Take 1 tablet (325 mg total) by mouth every 6 (six) hours as needed for mild pain (pain score 1-3). 90 tablet 0   amiodarone  (PACERONE ) 200 MG tablet Take 1 tablet by mouth on Monday, Tuesday, Thursday, Friday and Saturday. 20 tablet 0   donepezil  (ARICEPT ) 5 MG tablet TAKE 1 TABLET BY MOUTH AT BEDTIME 30 tablet 2   doxazosin  (CARDURA ) 2 MG tablet TAKE 1 TABLET BY MOUTH DAILY 90 tablet 3   ELIQUIS  2.5 MG TABS tablet TAKE 1 TABLET BY MOUTH TWICE A DAY 180 tablet 3   gabapentin (NEURONTIN) 100 MG capsule Take 100 mg by mouth 2 (two) times daily.     hydrALAZINE  (APRESOLINE ) 50 MG tablet TAKE 1 TABLET BY MOUTH EVERY 8 HOURS. 270 tablet 3   lisinopril-hydrochlorothiazide  (ZESTORETIC) 10-12.5 MG tablet TAKE 1 TABLET BY MOUTH DAILY 90 tablet 3   methocarbamol  (ROBAXIN ) 500 MG tablet Take 1 tablet (500 mg total) by mouth 3 (three) times daily as needed for muscle spasms. Take 500 mg by mouth 3 (three) times daily. 30 tablet 0   ondansetron  (ZOFRAN ) 4 MG tablet Take 1-2 tablets (4-8 mg total) by mouth every 8 (eight) hours as needed for nausea  or vomiting. 30 tablet 0   pantoprazole  (PROTONIX ) 40 MG tablet TAKE 1 TABLET BY MOUTH ONCE DAILY 30 tablet 11   rosuvastatin  (CRESTOR ) 10 MG tablet TAKE 1 TABLET BY MOUTH DAILY 90 tablet 3   D 1000 25 MCG (1000 UT) capsule TAKE 1 CAPSULE BY MOUTH AT BEDTIME. 30 capsule 3   LUMIGAN 0.01 % SOLN Place 1 drop into both eyes at bedtime.     metoprolol  tartrate (LOPRESSOR ) 25 MG tablet      omeprazole  (PRILOSEC  OTC) 20 MG tablet Take 1 tablet (20 mg total) by mouth daily. 90 tablet 0   SIMBRINZA 1-0.2 % SUSP Apply 1 drop to eye 3 (three) times daily.     sucralfate  (CARAFATE ) 1 g tablet Take 1 tablet (1 g total) by mouth with breakfast, with lunch, and with evening meal. 90 tablet 0   tiZANidine (ZANAFLEX) 4 MG tablet Take  1 tablet by mouth every 8 (eight) hours.     No facility-administered medications prior to visit.     Per HPI unless specifically indicated in ROS section below Review of Systems  Constitutional:  Positive for appetite change. Negative for fatigue and fever.  HENT:  Negative for congestion.   Eyes:  Negative for pain.  Respiratory:  Negative for cough and shortness of breath.   Cardiovascular:  Negative for chest pain, palpitations and leg swelling.  Gastrointestinal:  Positive for nausea. Negative for abdominal pain.  Genitourinary:  Negative for dysuria and vaginal bleeding.  Musculoskeletal:  Negative for back pain.  Neurological:  Negative for syncope, light-headedness and headaches.  Psychiatric/Behavioral:  Negative for dysphoric mood.    Objective:  BP 90/60   Pulse (!) 56   Temp 98 F (36.7 C) (Temporal)   Ht 5' 6 (1.676 m)   Wt 126 lb 2 oz (57.2 kg)   SpO2 98%   BMI 20.36 kg/m   Wt Readings from Last 3 Encounters:  06/04/24 126 lb 2 oz (57.2 kg)  02/27/24 132 lb 4 oz (60 kg)  02/14/24 133 lb 12.8 oz (60.7 kg)      Physical Exam Constitutional:      General: She is not in acute distress.    Appearance: Normal appearance. She is well-developed. She is not ill-appearing or toxic-appearing.  HENT:     Head: Normocephalic.     Right Ear: Hearing, tympanic membrane, ear canal and external ear normal. Tympanic membrane is not erythematous, retracted or bulging.     Left Ear: Hearing, tympanic membrane, ear canal and external ear normal. Tympanic membrane is not erythematous, retracted or bulging.     Nose: No mucosal edema or rhinorrhea.     Right Sinus: No maxillary sinus tenderness or frontal sinus tenderness.     Left Sinus: No maxillary sinus tenderness or frontal sinus tenderness.     Mouth/Throat:     Pharynx: Uvula midline.  Eyes:     General: Lids are normal. Lids are everted, no foreign bodies appreciated.     Conjunctiva/sclera: Conjunctivae normal.      Pupils: Pupils are equal, round, and reactive to light.  Neck:     Thyroid : No thyroid  mass or thyromegaly.     Vascular: No carotid bruit.     Trachea: Trachea normal.  Cardiovascular:     Rate and Rhythm: Normal rate and regular rhythm.     Pulses: Normal pulses.     Heart sounds: Normal heart sounds, S1 normal and S2 normal. No murmur heard.  No friction rub. No gallop.  Pulmonary:     Effort: Pulmonary effort is normal. No tachypnea or respiratory distress.     Breath sounds: Normal breath sounds. No decreased breath sounds, wheezing, rhonchi or rales.  Abdominal:     General: Bowel sounds are normal.     Palpations: Abdomen is soft.     Tenderness: There is no abdominal tenderness.  Musculoskeletal:     Cervical back: Normal range of motion and neck supple.     Lumbar back: No spasms, tenderness or bony tenderness. Decreased range of motion. Negative right straight leg raise test and negative left straight leg raise test.  Skin:    General: Skin is warm and dry.     Findings: No rash.  Neurological:     Mental Status: She is alert.  Psychiatric:        Mood and Affect: Mood is not anxious or depressed.        Speech: Speech normal.        Behavior: Behavior normal. Behavior is cooperative.        Thought Content: Thought content normal.        Judgment: Judgment normal.       Results for orders placed or performed during the hospital encounter of 05/22/24  CBC   Collection Time: 05/22/24 11:17 AM  Result Value Ref Range   WBC 6.0 4.0 - 10.5 K/uL   RBC 2.98 (L) 3.87 - 5.11 MIL/uL   Hemoglobin 9.1 (L) 12.0 - 15.0 g/dL   HCT 71.0 (L) 63.9 - 53.9 %   MCV 97.0 80.0 - 100.0 fL   MCH 30.5 26.0 - 34.0 pg   MCHC 31.5 30.0 - 36.0 g/dL   RDW 86.3 88.4 - 84.4 %   Platelets 163 150 - 400 K/uL   nRBC 0.0 0.0 - 0.2 %  C-reactive protein   Collection Time: 05/22/24 11:17 AM  Result Value Ref Range   CRP <0.5 <1.0 mg/dL  Sedimentation rate   Collection Time: 05/22/24 11:17  AM  Result Value Ref Range   Sed Rate 37 (H) 0 - 30 mm/hr    Assessment and Plan  Degenerative lumbar spinal stenosis  Acute left-sided low back pain with left-sided sciatica Assessment & Plan: Acute flare of chronic issue. Pain seems to be controlled better with lidocaine  patches and gabapentin 100 mg p.o. twice daily.  I encouraged family to give her the gabapentin regularly to get ahead of the pain as long as is not causing any sedate of side effects. Can use Robaxin  mainly at night if back spasms.  Continue Tylenol  1000 mg every 8 hours as needed for pain. She failed epidural steroid injections but has appointment with new orthopedic doctor Dr. Kathlynn.  Upcoming.  Reviewed return and ER precautions.   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 Family concerned donezepil could be causing side effects of upset stomach and nausea.  I do believe she had the chronic issue of nausea prior to starting this medication but we will try a trial off for the next 2 to 3 weeks.  Instructed to restart if decreased appetite and nausea not improving. If she does have side effects to donezepil we can consider starting Namenda in the future as a trial.   Chronic nausea Assessment & Plan: Chronic, unclear cause Status post unremarkable colonoscopy and endoscopy. Possibly due to medication side effect... Try holding donezepil  Can increase the zofran  to 2 tabs as needed  three times a day. She feels Carafate  was helpful in the past so I will restart this.      Weight loss, unintentional Assessment & Plan: Chronic, due to decreased appetite and chronic nausea. Can consider trial of citalopram for depression or mirtazapine/Megace if not improving with stopping donezepil at upcoming appointment.   Other orders -     Sucralfate ; Take 1 tablet (1 g total) by mouth 4 (four)  times daily -  with meals and at bedtime.  Dispense: 120 tablet; Refill: 3    Return for as previously scheduled.   Greig Ring, MD

## 2024-06-04 NOTE — Assessment & Plan Note (Signed)
 Acute flare of chronic issue. Pain seems to be controlled better with lidocaine  patches and gabapentin 100 mg p.o. twice daily.  I encouraged family to give her the gabapentin regularly to get ahead of the pain as long as is not causing any sedate of side effects. Can use Robaxin  mainly at night if back spasms.  Continue Tylenol  1000 mg every 8 hours as needed for pain. She failed epidural steroid injections but has appointment with new orthopedic doctor Dr. Kathlynn.  Upcoming.  Reviewed return and ER precautions.

## 2024-06-04 NOTE — Assessment & Plan Note (Signed)
 Chronic, due to decreased appetite and chronic nausea. Can consider trial of citalopram for depression or mirtazapine/Megace if not improving with stopping donezepil at upcoming appointment.

## 2024-06-04 NOTE — Assessment & Plan Note (Signed)
 Chronic, unclear cause Status post unremarkable colonoscopy and endoscopy. Possibly due to medication side effect... Try holding donezepil  Can increase the zofran  to 2 tabs as needed  three times a day. She feels Carafate  was helpful in the past so I will restart this.

## 2024-06-04 NOTE — Patient Instructions (Addendum)
 Can stop Donezepil... see if appetite improves or nausea improves...  restart if no change in 2-3 weeks.  Can use Carafate  to coat stomach to help with upset stomach.   We can always try namenda for memory or treat mood to help with appetite if needed.

## 2024-06-04 NOTE — Assessment & Plan Note (Addendum)
 Chronic, stable. Family concerned donezepil could be causing side effects of upset stomach and nausea.  I do believe she had the chronic issue of nausea prior to starting this medication but we will try a trial off for the next 2 to 3 weeks.  Instructed to restart if decreased appetite and nausea not improving. If she does have side effects to donezepil we can consider starting Namenda in the future as a trial.

## 2024-06-06 ENCOUNTER — Ambulatory Visit: Admitting: Cardiology

## 2024-06-12 DIAGNOSIS — N183 Chronic kidney disease, stage 3 unspecified: Secondary | ICD-10-CM | POA: Diagnosis not present

## 2024-06-12 DIAGNOSIS — Z026 Encounter for examination for insurance purposes: Secondary | ICD-10-CM | POA: Diagnosis not present

## 2024-06-12 DIAGNOSIS — M9933 Osseous stenosis of neural canal of lumbar region: Secondary | ICD-10-CM | POA: Diagnosis not present

## 2024-06-13 DIAGNOSIS — G4489 Other headache syndrome: Secondary | ICD-10-CM | POA: Diagnosis not present

## 2024-06-13 DIAGNOSIS — H401132 Primary open-angle glaucoma, bilateral, moderate stage: Secondary | ICD-10-CM | POA: Diagnosis not present

## 2024-06-14 ENCOUNTER — Ambulatory Visit: Payer: Self-pay | Admitting: *Deleted

## 2024-06-14 NOTE — Telephone Encounter (Signed)
 Agree.  Patient has an appointment September 9 with me.

## 2024-06-14 NOTE — Telephone Encounter (Signed)
 Copied from CRM [id number]. Topic: Clinical - Red Word Triage >> Jun 14, 2024 10:44 AM Pinkey ORN wrote: Red Word that prompted transfer to Nurse Triage: Swelling In Both Ankles >> Jun 14, 2024 10:45 AM Pinkey ORN wrote: Patient is experiencing swelling in both ankles, along with constantly falling asleep immediately when sitting down.  Reason for Disposition  [1] MILD swelling of both ankles (e.g., ankle joints look swollen; or bilateral mild pedal edema) AND [2] new-onset or getting worse  (Exceptions: Caused by hot weather, already seen by doctor or NP/PA for this.)  Answer Assessment - Initial Assessment Questions 1. LOCATION: Which ankle is swollen? Where is the swelling?     I'm having swelling in both of my ankles.    I've not been on my feet.   This morning it's worse.     Last week she was having swelling in one.   When she sits down she goes to sleep.   Someone else in the background talking too.NSET: When did the swelling start?     Last week. The right ankle was swelling first now it's both of them.   My right leg is painful.    3. SWELLING: How bad is the swelling? Or, How large is it? (e.g., mild, moderate, severe; size of localized swelling)      The left ankle is more swollen today.   Both are swollen. 4. PAIN: Is there any pain? If Yes, ask: How bad is it? (Scale 0-10; or none, mild, moderate, severe)     Some pain She has problem with her back too.   She's being treated for that.   She has pain from her back to her toes.   She's getting shots for that. 5. CAUSE: What do you think caused the ankle swelling?     No shortness of breath.   I don't feel like doing much.    6. OTHER SYMPTOMS: Do you have any other symptoms? (e.g., fever, chest pain, difficulty breathing, calf pain)     No shortness of breath 7. PREGNANCY: Is there any chance you are pregnant? When was your last menstrual period?     N/A  Protocols used: Ankle Swelling-A-AH FYI Only or  Action Required?: FYI only for provider.  Patient was last seen in primary care on 06/04/2024 by Avelina Greig BRAVO, MD.  Called Nurse Triage reporting Joint Swelling. Both ankles swollen and very tired.   Swelling is worse this morning 9/5.    Started last week.   Denies shortness of breath or chest tightness Symptoms began a week ago.  Interventions attempted: Other: elevating her legs.  Symptoms are: gradually worsening.  Triage Disposition: See PCP When Office is Open (Within 3 Days) Advised to go to the ED if her symptoms became worse over the weekend or she developed shortness of breath or chest tightness/discomfort.    Pt agreeable to this plan.  Patient/caregiver understands and will follow disposition?: Yes

## 2024-06-17 DIAGNOSIS — I129 Hypertensive chronic kidney disease with stage 1 through stage 4 chronic kidney disease, or unspecified chronic kidney disease: Secondary | ICD-10-CM | POA: Diagnosis not present

## 2024-06-17 DIAGNOSIS — N184 Chronic kidney disease, stage 4 (severe): Secondary | ICD-10-CM | POA: Diagnosis not present

## 2024-06-17 DIAGNOSIS — D631 Anemia in chronic kidney disease: Secondary | ICD-10-CM | POA: Diagnosis not present

## 2024-06-17 DIAGNOSIS — E785 Hyperlipidemia, unspecified: Secondary | ICD-10-CM | POA: Diagnosis not present

## 2024-06-18 ENCOUNTER — Observation Stay
Admission: EM | Admit: 2024-06-18 | Discharge: 2024-06-20 | Disposition: A | Discharge status: Home / Self Care | Attending: Internal Medicine | Admitting: Internal Medicine

## 2024-06-18 ENCOUNTER — Other Ambulatory Visit: Payer: Self-pay

## 2024-06-18 ENCOUNTER — Telehealth: Payer: Self-pay | Admitting: *Deleted

## 2024-06-18 ENCOUNTER — Emergency Department

## 2024-06-18 ENCOUNTER — Ambulatory Visit: Payer: Self-pay | Admitting: Family Medicine

## 2024-06-18 ENCOUNTER — Ambulatory Visit: Admitting: Family Medicine

## 2024-06-18 ENCOUNTER — Encounter: Payer: Self-pay | Admitting: Family Medicine

## 2024-06-18 VITALS — BP 104/64 | HR 52 | Temp 97.5°F | Resp 20 | Ht 66.0 in | Wt 138.2 lb

## 2024-06-18 DIAGNOSIS — D631 Anemia in chronic kidney disease: Secondary | ICD-10-CM | POA: Diagnosis not present

## 2024-06-18 DIAGNOSIS — E785 Hyperlipidemia, unspecified: Secondary | ICD-10-CM | POA: Insufficient documentation

## 2024-06-18 DIAGNOSIS — F0390 Unspecified dementia without behavioral disturbance: Secondary | ICD-10-CM | POA: Insufficient documentation

## 2024-06-18 DIAGNOSIS — I4819 Other persistent atrial fibrillation: Secondary | ICD-10-CM | POA: Diagnosis not present

## 2024-06-18 DIAGNOSIS — I340 Nonrheumatic mitral (valve) insufficiency: Secondary | ICD-10-CM | POA: Diagnosis not present

## 2024-06-18 DIAGNOSIS — F03B0 Unspecified dementia, moderate, without behavioral disturbance, psychotic disturbance, mood disturbance, and anxiety: Secondary | ICD-10-CM | POA: Diagnosis present

## 2024-06-18 DIAGNOSIS — Z8639 Personal history of other endocrine, nutritional and metabolic disease: Secondary | ICD-10-CM | POA: Insufficient documentation

## 2024-06-18 DIAGNOSIS — I351 Nonrheumatic aortic (valve) insufficiency: Secondary | ICD-10-CM | POA: Diagnosis present

## 2024-06-18 DIAGNOSIS — I130 Hypertensive heart and chronic kidney disease with heart failure and stage 1 through stage 4 chronic kidney disease, or unspecified chronic kidney disease: Secondary | ICD-10-CM | POA: Insufficient documentation

## 2024-06-18 DIAGNOSIS — N189 Chronic kidney disease, unspecified: Secondary | ICD-10-CM | POA: Diagnosis present

## 2024-06-18 DIAGNOSIS — E782 Mixed hyperlipidemia: Secondary | ICD-10-CM | POA: Diagnosis present

## 2024-06-18 DIAGNOSIS — I5033 Acute on chronic diastolic (congestive) heart failure: Secondary | ICD-10-CM | POA: Diagnosis not present

## 2024-06-18 DIAGNOSIS — K219 Gastro-esophageal reflux disease without esophagitis: Secondary | ICD-10-CM | POA: Diagnosis present

## 2024-06-18 DIAGNOSIS — R4182 Altered mental status, unspecified: Secondary | ICD-10-CM | POA: Insufficient documentation

## 2024-06-18 DIAGNOSIS — N179 Acute kidney failure, unspecified: Principal | ICD-10-CM | POA: Diagnosis present

## 2024-06-18 DIAGNOSIS — G301 Alzheimer's disease with late onset: Secondary | ICD-10-CM | POA: Diagnosis not present

## 2024-06-18 DIAGNOSIS — R531 Weakness: Secondary | ICD-10-CM | POA: Diagnosis present

## 2024-06-18 DIAGNOSIS — I480 Paroxysmal atrial fibrillation: Secondary | ICD-10-CM | POA: Diagnosis present

## 2024-06-18 DIAGNOSIS — R600 Localized edema: Secondary | ICD-10-CM

## 2024-06-18 DIAGNOSIS — I2510 Atherosclerotic heart disease of native coronary artery without angina pectoris: Secondary | ICD-10-CM | POA: Diagnosis present

## 2024-06-18 DIAGNOSIS — R634 Abnormal weight loss: Secondary | ICD-10-CM

## 2024-06-18 DIAGNOSIS — I10 Essential (primary) hypertension: Secondary | ICD-10-CM | POA: Diagnosis present

## 2024-06-18 DIAGNOSIS — I352 Nonrheumatic aortic (valve) stenosis with insufficiency: Secondary | ICD-10-CM | POA: Diagnosis not present

## 2024-06-18 DIAGNOSIS — N19 Unspecified kidney failure: Secondary | ICD-10-CM

## 2024-06-18 DIAGNOSIS — N200 Calculus of kidney: Secondary | ICD-10-CM | POA: Diagnosis not present

## 2024-06-18 DIAGNOSIS — D638 Anemia in other chronic diseases classified elsewhere: Secondary | ICD-10-CM | POA: Diagnosis present

## 2024-06-18 DIAGNOSIS — Q619 Cystic kidney disease, unspecified: Secondary | ICD-10-CM | POA: Diagnosis not present

## 2024-06-18 DIAGNOSIS — I517 Cardiomegaly: Secondary | ICD-10-CM | POA: Diagnosis not present

## 2024-06-18 DIAGNOSIS — D696 Thrombocytopenia, unspecified: Secondary | ICD-10-CM | POA: Diagnosis present

## 2024-06-18 DIAGNOSIS — F02B0 Dementia in other diseases classified elsewhere, moderate, without behavioral disturbance, psychotic disturbance, mood disturbance, and anxiety: Secondary | ICD-10-CM | POA: Diagnosis not present

## 2024-06-18 DIAGNOSIS — Z8739 Personal history of other diseases of the musculoskeletal system and connective tissue: Secondary | ICD-10-CM

## 2024-06-18 DIAGNOSIS — R001 Bradycardia, unspecified: Secondary | ICD-10-CM | POA: Diagnosis not present

## 2024-06-18 DIAGNOSIS — I700 Atherosclerosis of aorta: Secondary | ICD-10-CM | POA: Diagnosis not present

## 2024-06-18 DIAGNOSIS — I5032 Chronic diastolic (congestive) heart failure: Secondary | ICD-10-CM | POA: Diagnosis present

## 2024-06-18 LAB — COMPREHENSIVE METABOLIC PANEL WITH GFR
ALT: 11 U/L (ref 0–35)
ALT: 13 U/L (ref 0–44)
AST: 15 U/L (ref 0–37)
AST: 19 U/L (ref 15–41)
Albumin: 3.8 g/dL (ref 3.5–5.2)
Albumin: 3.6 g/dL (ref 3.5–5.0)
Alkaline Phosphatase: 42 U/L (ref 39–117)
Alkaline Phosphatase: 45 U/L (ref 38–126)
Anion gap: 9 (ref 5–15)
BUN: 52 mg/dL — ABNORMAL HIGH (ref 6–23)
BUN: 58 mg/dL — ABNORMAL HIGH (ref 8–23)
CO2: 26 meq/L (ref 19–32)
CO2: 22 mmol/L (ref 22–32)
Calcium: 9 mg/dL (ref 8.4–10.5)
Calcium: 9 mg/dL (ref 8.9–10.3)
Chloride: 105 meq/L (ref 96–112)
Chloride: 109 mmol/L (ref 98–111)
Creatinine, Ser: 3.24 mg/dL — ABNORMAL HIGH (ref 0.40–1.20)
Creatinine, Ser: 2.96 mg/dL — ABNORMAL HIGH (ref 0.44–1.00)
GFR, Estimated: 15 mL/min — ABNORMAL LOW (ref >60)
GFR: 12.85 mL/min — CL (ref >60.00)
Glucose, Bld: 88 mg/dL (ref 70–99)
Glucose, Bld: 105 mg/dL — ABNORMAL HIGH (ref 70–99)
Potassium: 4 meq/L (ref 3.5–5.1)
Potassium: 3.7 mmol/L (ref 3.5–5.1)
Sodium: 139 meq/L (ref 135–145)
Sodium: 140 mmol/L (ref 135–145)
Total Bilirubin: 0.4 mg/dL (ref 0.2–1.2)
Total Bilirubin: 0.8 mg/dL (ref 0.0–1.2)
Total Protein: 6.3 g/dL (ref 6.0–8.3)
Total Protein: 6.9 g/dL (ref 6.5–8.1)

## 2024-06-18 LAB — URINALYSIS, ROUTINE W REFLEX MICROSCOPIC
Bilirubin Urine: NEGATIVE
Glucose, UA: NEGATIVE mg/dL
Hgb urine dipstick: NEGATIVE
Ketones, ur: NEGATIVE mg/dL
Nitrite: NEGATIVE
Protein, ur: NEGATIVE mg/dL
Specific Gravity, Urine: 1.011 (ref 1.005–1.030)
Squamous Epithelial / HPF: 0 /HPF (ref 0–5)
pH: 5 (ref 5.0–8.0)

## 2024-06-18 LAB — CBC
HCT: 24.8 % — ABNORMAL LOW (ref 36.0–46.0)
Hemoglobin: 8.3 g/dL — ABNORMAL LOW (ref 12.0–15.0)
MCH: 31.7 pg (ref 26.0–34.0)
MCHC: 33.5 g/dL (ref 30.0–36.0)
MCV: 94.7 fL (ref 80.0–100.0)
Platelets: 143 K/uL — ABNORMAL LOW (ref 150–400)
RBC: 2.62 MIL/uL — ABNORMAL LOW (ref 3.87–5.11)
RDW: 14 % (ref 11.5–15.5)
WBC: 5 K/uL (ref 4.0–10.5)
nRBC: 0 % (ref 0.0–0.2)

## 2024-06-18 LAB — CBG MONITORING, ED: Glucose-Capillary: 95 mg/dL (ref 70–99)

## 2024-06-18 LAB — CBC WITH DIFFERENTIAL/PLATELET
Basophils Absolute: 0 K/uL (ref 0.0–0.1)
Basophils Relative: 0.7 % (ref 0.0–3.0)
Eosinophils Absolute: 0.1 K/uL (ref 0.0–0.7)
Eosinophils Relative: 2.3 % (ref 0.0–5.0)
HCT: 25.5 % — ABNORMAL LOW (ref 36.0–46.0)
Hemoglobin: 8.4 g/dL — ABNORMAL LOW (ref 12.0–15.0)
Lymphocytes Relative: 26.9 % (ref 12.0–46.0)
Lymphs Abs: 1.2 K/uL (ref 0.7–4.0)
MCHC: 32.9 g/dL (ref 30.0–36.0)
MCV: 94.1 fl (ref 78.0–100.0)
Monocytes Absolute: 0.4 K/uL (ref 0.1–1.0)
Monocytes Relative: 7.8 % (ref 3.0–12.0)
Neutro Abs: 2.8 K/uL (ref 1.4–7.7)
Neutrophils Relative %: 62.3 % (ref 43.0–77.0)
Platelets: 138 K/uL — ABNORMAL LOW (ref 150.0–400.0)
RBC: 2.71 Mil/uL — ABNORMAL LOW (ref 3.87–5.11)
RDW: 14.6 % (ref 11.5–15.5)
WBC: 4.6 K/uL (ref 4.0–10.5)

## 2024-06-18 LAB — PROTIME-INR
INR: 1.1 (ref 0.8–1.2)
Prothrombin Time: 14.3 s (ref 11.4–15.2)

## 2024-06-18 LAB — TSH: TSH: 1.71 u[IU]/mL (ref 0.35–5.50)

## 2024-06-18 LAB — BRAIN NATRIURETIC PEPTIDE: Pro B Natriuretic peptide (BNP): 305 pg/mL — ABNORMAL HIGH (ref 0.0–100.0)

## 2024-06-18 NOTE — Assessment & Plan Note (Signed)
 Per husband decreased appetite and therefore weight loss unintentional has improved significantly off of Aricept .

## 2024-06-18 NOTE — Telephone Encounter (Signed)
 Patient's daughter notified of worsening kidney function, decreased hemoglobin and fluid overload. she voiced understanding.  She will speak with patient and husband .SABRA..directed to go to the ED  as soon as able.

## 2024-06-18 NOTE — Assessment & Plan Note (Signed)
 Chronic, no longer taking Aricept  as it caused decreased appetite.  When she is feeling better we can consider a trial of Namenda.

## 2024-06-18 NOTE — Assessment & Plan Note (Signed)
 Acute worsening of mental status in the last few weeks on top of her baseline dementia.  She is not more confused so much as she is falling asleep frequently during the day.  Will evaluate with labs.  No clear sign of ongoing infection causing symptoms. Encouraged husband to further decrease sedating medications such as gabapentin .

## 2024-06-18 NOTE — Progress Notes (Signed)
 Patient ID: [name], female    DOB: 09-19-1941, 83 y.o.   MRN: [id number]  This visit was conducted in person.  BP 104/64   Pulse (!) 52   Temp (!) 97.5 F (36.4 C)   Resp 20   Ht 5' 6 (1.676 m)   Wt 138 lb 4 oz (62.7 kg)   SpO2 98%   BMI 22.31 kg/m    CC:  Chief Complaint  Patient presents with   Joint Swelling    Both X couple weeks    Subjective:   HPI: [name] is a 83 y.o. female presenting on 06/18/2024 for Joint Swelling (Both X couple weeks)  New onset  swelling in bilateral ankles in last 2 weeks.  No new chest pain, no SOB.  She is moving around less... seems to be more sleepy during the day. Nods of when sitting for a short time.  No paroxysmal nocturnal dyspnea.   Using lasix  1/2 tablet off and on for for swelling.  Per husband she stays in bed all night... but she feels she is not feeling rested when waking.  No new meds.  NO big change in salt intake but  Hx of aortic valve sclerosis/ insufficiency    She is eating better off ht donepezil .  Will keep her off .    Keeps forgetting to get urine sample for Dr. Arthur kidney specialist.   Rarely using muscle relaxant. Using gabapentin   most days.  Relevant past medical, surgical, family and social history reviewed and updated as indicated. Interim medical history since our last visit reviewed. Allergies and medications reviewed and updated. Outpatient Medications Prior to Visit  Medication Sig Dispense Refill   acetaminophen  (TYLENOL ) 325 MG tablet Take 1 tablet (325 mg total) by mouth every 6 (six) hours as needed for mild pain (pain score 1-3). 90 tablet 0   amiodarone  (PACERONE ) 200 MG tablet TAKE 1/2 TABLET (100 MG TOTAL) BY MOUTH ON MONDAY, TUESDAY, THURSDAY, FRIDAY ANDSATURDAY 8 tablet 0   doxazosin  (CARDURA ) 2 MG tablet TAKE 1 TABLET BY MOUTH DAILY 90 tablet 3   ELIQUIS  2.5 MG TABS tablet TAKE 1 TABLET BY MOUTH TWICE A DAY 180 tablet 3   gabapentin  (NEURONTIN ) 100 MG capsule  Take 100 mg by mouth 2 (two) times daily.     hydrALAZINE  (APRESOLINE ) 50 MG tablet TAKE 1 TABLET BY MOUTH EVERY 8 HOURS. 270 tablet 3   lisinopril-hydrochlorothiazide  (ZESTORETIC) 10-12.5 MG tablet TAKE 1 TABLET BY MOUTH DAILY 90 tablet 3   methocarbamol  (ROBAXIN ) 500 MG tablet Take 1 tablet (500 mg total) by mouth 3 (three) times daily as needed for muscle spasms. Take 500 mg by mouth 3 (three) times daily. 30 tablet 0   ondansetron  (ZOFRAN ) 4 MG tablet Take 1-2 tablets (4-8 mg total) by mouth every 8 (eight) hours as needed for nausea or vomiting. 30 tablet 0   pantoprazole  (PROTONIX ) 40 MG tablet TAKE 1 TABLET BY MOUTH DAILY 30 tablet 11   rosuvastatin  (CRESTOR ) 10 MG tablet TAKE 1 TABLET BY MOUTH DAILY 90 tablet 3   sucralfate  (CARAFATE ) 1 g tablet Take 1 tablet (1 g total) by mouth 4 (four) times daily -  with meals and at bedtime. 120 tablet 3   donepezil  (ARICEPT ) 5 MG tablet TAKE 1 TABLET BY MOUTH AT BEDTIME 30 tablet 2   No facility-administered medications prior to visit.     Per HPI unless specifically indicated in ROS section below Review of Systems  Constitutional:  Positive for fatigue. Negative for fever.  HENT:  Negative for congestion.   Eyes:  Negative for pain.  Respiratory:  Negative for cough and shortness of breath.   Cardiovascular:  Positive for leg swelling. Negative for chest pain and palpitations.  Gastrointestinal:  Negative for abdominal pain.  Genitourinary:  Negative for dysuria and vaginal bleeding.  Musculoskeletal:  Negative for back pain.  Neurological:  Negative for syncope, light-headedness and headaches.  Psychiatric/Behavioral:  Negative for dysphoric mood.    Objective:  BP 104/64   Pulse (!) 52   Temp (!) 97.5 F (36.4 C)   Resp 20   Ht 5' 6 (1.676 m)   Wt 138 lb 4 oz (62.7 kg)   SpO2 98%   BMI 22.31 kg/m   Wt Readings from Last 3 Encounters:  06/18/24 130 lb (59 kg)  06/18/24 138 lb 4 oz (62.7 kg)  06/04/24 126 lb 2 oz (57.2 kg)       Physical Exam Constitutional:      General: She is not in acute distress.    Appearance: Normal appearance. She is well-developed. She is not ill-appearing or toxic-appearing.  HENT:     Head: Normocephalic.     Right Ear: Hearing, tympanic membrane, ear canal and external ear normal. Tympanic membrane is not erythematous, retracted or bulging.     Left Ear: Hearing, tympanic membrane, ear canal and external ear normal. Tympanic membrane is not erythematous, retracted or bulging.     Nose: No mucosal edema or rhinorrhea.     Right Sinus: No maxillary sinus tenderness or frontal sinus tenderness.     Left Sinus: No maxillary sinus tenderness or frontal sinus tenderness.     Mouth/Throat:     Pharynx: Uvula midline.  Eyes:     General: Lids are normal. Lids are everted, no foreign bodies appreciated.     Conjunctiva/sclera: Conjunctivae normal.     Pupils: Pupils are equal, round, and reactive to light.  Neck:     Thyroid : No thyroid  mass or thyromegaly.     Vascular: No carotid bruit.     Trachea: Trachea normal.  Cardiovascular:     Rate and Rhythm: Normal rate and regular rhythm.     Pulses: Normal pulses.     Heart sounds: Normal heart sounds, S1 normal and S2 normal. No murmur heard.    No friction rub. No gallop.  Pulmonary:     Effort: Pulmonary effort is normal. No tachypnea or respiratory distress.     Breath sounds: Normal breath sounds. No decreased breath sounds, wheezing, rhonchi or rales.  Abdominal:     General: Bowel sounds are normal.     Palpations: Abdomen is soft.     Tenderness: There is no abdominal tenderness.  Musculoskeletal:     Cervical back: Normal range of motion and neck supple.     Right lower leg: 1+ Pitting Edema present.     Left lower leg: 1+ Pitting Edema present.  Skin:    General: Skin is warm and dry.     Findings: No rash.  Neurological:     Mental Status: She is alert.  Psychiatric:        Mood and Affect: Mood is not anxious or  depressed.        Speech: Speech normal.        Behavior: Behavior normal. Behavior is cooperative.        Thought Content: Thought content normal.  Judgment: Judgment normal.         Assessment and Plan  Peripheral edema Assessment & Plan: Acute, recent increase in peripheral swelling.  Patient with history of chronic kidney disease and cardiac valvular disease. She is currently taking half of a furosemide  daily for peripheral swelling.  Previously used as needed now taking daily. This has not helped much with diuresis.  Will evaluate for secondary cause of worsening swelling with labs.  She has been less active lately but no change in salt intake, no alcohol intake.  Encouraged her to continue to elevate her feet above her heart  Orders: -     Brain natriuretic peptide -     Comprehensive metabolic panel with GFR -     TSH  Altered mental status, unspecified altered mental status type Assessment & Plan: Acute worsening of mental status in the last few weeks on top of her baseline dementia.  She is not more confused so much as she is falling asleep frequently during the day.  Will evaluate with labs.  No clear sign of ongoing infection causing symptoms. Encouraged husband to further decrease sedating medications such as gabapentin .  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  When she is feeling better we can consider a trial of Namenda.   Weight loss, unintentional Assessment & Plan: Per husband decreased appetite and therefore weight loss unintentional has improved significantly off of Aricept .     No follow-ups on file.   Greig Ring, MD

## 2024-06-18 NOTE — Patient Instructions (Addendum)
 Change gabapentin  to only at night.  OK to stay off aricept  given decrease appetite.  IN the future we can try different memory medication if wanted.

## 2024-06-18 NOTE — Telephone Encounter (Signed)
 Ernst with [facility] called with a critical lab.  Pt has a critical GFR of 12.85  Results give to PCP

## 2024-06-18 NOTE — H&P (Signed)
 History and Physical    Patient: [name] FMW:[id number] DOB: Aug 28, 1941 DOA: 06/18/2024 DOS: the patient was seen and examined on 06/18/2024 PCP: Avelina Greig BRAVO, MD  Patient coming from: {Point_of_Origin:26777}  Chief Complaint:  Chief Complaint  Patient presents with   Abnormal Lab   Weakness   HPI: [name] is a 83 y.o. female with medical history significant of ***  Review of Systems: {ROS_Text:26778} Past Medical History:  Diagnosis Date   Anemia    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 cath 2/09: no CAD, EF 70%   GERD (gastroesophageal reflux disease) 07/26/2021   HYPERLIPIDEMIA    HYPERTENSION    Hypertensive urgency 07/19/2022   Mild Aortic insufficiency    Moderate mitral regurgitation    Moderate tricuspid regurgitation    OSTEOPENIA    PAF (paroxysmal atrial fibrillation) (HCC)    a. s/p ablation 2013; b. CHADS2VASc -> 4 (HTN, age x 2, female)-->Eliquis .   PAT (paroxysmal atrial tachycardia) (HCC)    a. 04/2019 Zio: Occas PACs and rare PVCs. 21 atrial runs - longest 20 beats, max rate 169.   Pneumonia 2009   RA (rheumatoid arthritis) (HCC)    Sinus Bradycardia    a. asymptomatic but prevents use of AVN blocking agents; b. 04/2019 Zio: Avg HR 61 (37-109).   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 05/2019 Echo: EF 60-65%. DD. RVSP 41.19mmHg. Mod dil LA. Mod MR/TR. Mild AI.   Past Surgical History:  Procedure Laterality Date   ABLATION OF DYSRHYTHMIC FOCUS     CARDIAC CATHETERIZATION     COLONOSCOPY  04/18/08   COLONOSCOPY WITH PROPOFOL  N/A 02/28/2023   Procedure: COLONOSCOPY WITH PROPOFOL ;  Surgeon: Unk Corinn Skiff, MD;  Location: [facility] ENDOSCOPY;  Service: Gastroenterology;  Laterality: N/A;   ESOPHAGOGASTRODUODENOSCOPY (EGD) WITH PROPOFOL  N/A 02/28/2023   Procedure: ESOPHAGOGASTRODUODENOSCOPY (EGD) WITH PROPOFOL ;  Surgeon: Unk Corinn Skiff, MD;  Location: [facility] ENDOSCOPY;  Service: Gastroenterology;  Laterality: N/A;   Partial hysterectomy--1979     Thoracentesis   12/20/07     Social History:  reports that she has never smoked. She has never used smokeless tobacco. She reports that she does not drink alcohol and does not use drugs.  Allergies  Allergen Reactions   Amlodipine      Ankle swelling   Celecoxib Other (See Comments) and Rash    Tachycardia/palpitations Other reaction(s): Other Tachycardia/palpitations Tachycardia/palpitations   Tramadol  Nausea Only    Family History  Problem Relation Age of Onset   Diabetes Other    Breast cancer Paternal Aunt 8   Heart disease Mother    Pancreatic cancer Father    Atrial fibrillation Brother    Heart disease Brother    Heart attack Brother     Prior to Admission medications   Medication Sig Start Date End Date Taking? Authorizing Provider  acetaminophen  (TYLENOL ) 325 MG tablet Take 1 tablet (325 mg total) by mouth every 6 (six) hours as needed for mild pain (pain score 1-3). 05/27/24   Bedsole, Amy E, MD  amiodarone  (PACERONE ) 200 MG tablet TAKE 1/2 TABLET (100 MG TOTAL) BY MOUTH ON MONDAY, TUESDAY, THURSDAY, FRIDAY [facility] 06/05/24   Camnitz, Soyla Lunger, MD  doxazosin  (CARDURA ) 2 MG tablet TAKE 1 TABLET BY MOUTH DAILY 07/25/23   Raford Riggs, MD  ELIQUIS  2.5 MG TABS tablet TAKE 1 TABLET BY  MOUTH TWICE A DAY 07/25/23   Raford Riggs, MD  gabapentin  (NEURONTIN ) 100 MG capsule Take 100 mg by mouth 2 (two) times daily. 04/10/24   [provider]  hydrALAZINE  (APRESOLINE ) 50 MG tablet TAKE 1 TABLET BY MOUTH EVERY 8 HOURS. 07/25/23   Raford Riggs, MD  lisinopril-hydrochlorothiazide  (ZESTORETIC) 10-12.5 MG tablet TAKE 1 TABLET BY MOUTH DAILY 11/21/23   Raford Riggs, MD  methocarbamol  (ROBAXIN ) 500 MG tablet Take 1 tablet (500 mg total) by mouth 3 (three) times daily as needed for muscle spasms. Take 500 mg by mouth 3 (three) times  daily. 05/28/24   Bedsole, Amy E, MD  ondansetron  (ZOFRAN ) 4 MG tablet Take 1-2 tablets (4-8 mg total) by mouth every 8 (eight) hours as needed for nausea or vomiting. 05/23/24   Bedsole, Amy E, MD  pantoprazole  (PROTONIX ) 40 MG tablet TAKE 1 TABLET BY MOUTH DAILY 06/04/24   Bedsole, Amy E, MD  rosuvastatin  (CRESTOR ) 10 MG tablet TAKE 1 TABLET BY MOUTH DAILY 08/23/23   Raford Riggs, MD  sucralfate  (CARAFATE ) 1 g tablet Take 1 tablet (1 g total) by mouth 4 (four) times daily -  with meals and at bedtime. 06/04/24   Avelina Greig BRAVO, MD    Physical Exam: Vitals:   06/18/24 1719 06/18/24 1720  BP: 122/60   Pulse: (!) 58   Resp: 18   Temp: 98.5 F (36.9 C)   TempSrc: Oral   SpO2: 97%   Weight:  59 kg  Height:  5' 6 (1.676 m)   *** Data Reviewed: {Tip this will not be part of the note when signed- Document your independent interpretation of telemetry tracing, EKG, lab, Radiology test or any other diagnostic tests. Add any new diagnostic test ordered today. (Optional):26781} {Results:26384}  Assessment and Plan: No notes have been filed under this hospital service. Service: Hospitalist     Advance Care Planning:   Code Status: Prior ***  Consults: ***  Family Communication: ***  Severity of Illness: {Observation/Inpatient:21159}  Author: Glenys GORMAN Birk, MD 06/18/2024 11:31 PM  For on call review [email or web address].

## 2024-06-18 NOTE — Assessment & Plan Note (Signed)
 Acute, recent increase in peripheral swelling.  Patient with history of chronic kidney disease and cardiac valvular disease. She is currently taking half of a furosemide  daily for peripheral swelling.  Previously used as needed now taking daily. This has not helped much with diuresis.  Will evaluate for secondary cause of worsening swelling with labs.  She has been less active lately but no change in salt intake, no alcohol intake.  Encouraged her to continue to elevate her feet above her heart

## 2024-06-18 NOTE — ED Provider Notes (Signed)
 [facility] Provider Note    Event Date/Time   First MD Initiated Contact with Patient 06/18/24 2156     (approximate)   History   Abnormal Lab and Weakness  Pt arrives via POV with c/p abnormal labs. Pt & family state that the kidney labs are abnormal but is really unsure what is abnormal. Pt has been weak for a few weeks. Pt is A&Ox4 during triage.    HPI [name] is a 83 y.o. female PMH multiple medical comorbidities including Alzheimer's, CKD, A-fib on Eliquis , hyperlipidemia, sinus bradycardia presents for evaluation of her abnormal outpatient labs -Per chart review, patient was seen in family medicine clinic yesterday after noting recent increase in peripheral swelling.  Has been taking furosemide  as prescribed, was previously taking as needed but now daily.  Continue to have worsening swelling.  Labs collected, subsequently showed AKI on CKD, creatinine 3.2, baseline around 2.  Associated uremia.  BMP stable.  CBC overall stable, mildly decreased from prior, suspect dilutional. TSH wnl.  Patient notified, recommended ED evaluation. - On my evaluation, patient has no specific complaints.  Appears she has been having worsening bilateral lower extremity edema over the past week or so.  Family has also noted that she is slightly more confused than usual, more forgetful, here over the past several days. - No fever or other infectious symptoms - Confirms she has been taking her Lasix  as prescribed - Family does note patient has been having some difficulty urinating recently      Physical Exam   Triage Vital Signs: ED Triage Vitals  Encounter Vitals Group     BP 06/18/24 1719 122/60     Girls Systolic BP Percentile --      Girls Diastolic BP Percentile --      Boys Systolic BP Percentile --      Boys Diastolic BP Percentile --      Pulse Rate 06/18/24 1719 (!) 58     Resp 06/18/24 1719 18     Temp 06/18/24 1719 98.5 F (36.9 C)     Temp  Source 06/18/24 1719 Oral     SpO2 06/18/24 1719 97 %     Weight 06/18/24 1720 130 lb (59 kg)     Height 06/18/24 1720 5' 6 (1.676 m)     Head Circumference --      Peak Flow --      Pain Score 06/18/24 1719 0     Pain Loc --      Pain Education --      Exclude from Growth Chart --     Most recent vital signs: Vitals:   06/18/24 1719  BP: 122/60  Pulse: (!) 58  Resp: 18  Temp: 98.5 F (36.9 C)  SpO2: 97%     General: Awake, no distress.  CV:  Good peripheral perfusion. RRR, RP 2+. 1+ Resp:  Normal effort. CTAB Abd:  No distention. Nontender to deep palpation throughout Neuro:  Aox2, no facial asymmetry, moving all extremities spontaneously, no focal motor deficit appreciated   ED Results / Procedures / Treatments   Labs (all labs ordered are listed, but only abnormal results are displayed) Labs Reviewed  COMPREHENSIVE METABOLIC PANEL WITH GFR - Abnormal; Notable for the following components:      Result Value   Glucose, Bld 105 (*)    BUN 58 (*)    Creatinine, Ser 2.96 (*)    GFR, Estimated 15 (*)    All  other components within normal limits  CBC - Abnormal; Notable for the following components:   RBC 2.62 (*)    Hemoglobin 8.3 (*)    HCT 24.8 (*)    Platelets 143 (*)    All other components within normal limits  URINALYSIS, ROUTINE W REFLEX MICROSCOPIC - Abnormal; Notable for the following components:   Color, Urine YELLOW (*)    APPearance CLEAR (*)    Leukocytes,Ua TRACE (*)    Bacteria, UA RARE (*)    All other components within normal limits  PROTIME-INR  CBG MONITORING, ED     EKG  Ecg = sinus bradycardia versus junctional rhythm, rate 58, no gross ST elevation or depression, no significant repolarization abnormality, normal axis, normal intervals.  No evidence of ischemia no arrhythmia on my interpretation.   RADIOLOGY Radiology interpreted by myself radiology report reviewed.  Cardiomegaly.  No acute pathology.    PROCEDURES:  Critical  Care performed: No  Procedures   MEDICATIONS ORDERED IN ED: Medications - No data to display   IMPRESSION / MDM / ASSESSMENT AND PLAN / ED COURSE  I reviewed the triage vital signs and the nursing notes.                              DDX/MDM/AP: Differential diagnosis includes, but is not limited to, worsening renal failure, consider obstructive uropathy, less likely CHF given normal BNP earlier today though will add chest x-ray for further evaluation.  Suspect mild change in mental status may be due to somewhat worsened uremia, consider underlying UTI.  Plan: - Labs - Chest x-ray - Bladder scan - EKG - Reassess  Patient's presentation is most consistent with acute presentation with potential threat to life or bodily function.  The patient is on the cardiac monitor to evaluate for evidence of arrhythmia and/or significant heart rate changes.  ED course below.  Admitted to hospitalist service for further evaluation of AKI on CKD.  Deferring diuresis at this time.  Renal ultrasound pending.  Can discuss with nephrology in a.m..  Suspect worsening uremia secondary to worsening renal function as likely cause of patient's mild altered mental state.  PVR normal, no obstructive uropathy.  Urinalysis pending.  Clinical Course as of 06/19/24 0005  Tue Jun 18, 2024  2240 CMP with persistent AKI  Hemoglobin within baseline range [MM]  2317 Bladder scan 177 cc PCR, not consistent with urinary retention  Do not suspect obstructive uropathy as etiology [MM]  2318 Chest x-ray with cardiomegaly but otherwise unremarkable [MM]  2318 Hospitalist consult order placed [MM]    Clinical Course User Index [MM] Clarine Ozell LABOR, MD     FINAL CLINICAL IMPRESSION(S) / ED DIAGNOSES   Final diagnoses:  AKI (acute kidney injury) (HCC)  Uremia  Peripheral edema     Rx / DC Orders   ED Discharge Orders     None        Note:  This document was prepared using Dragon voice recognition  software and may include unintentional dictation errors.   Clarine Ozell LABOR, MD 06/19/24 0005

## 2024-06-18 NOTE — ED Triage Notes (Signed)
 Pt arrives via POV with c/p abnormal labs. Pt & family state that the kidney labs are abnormal but is really unsure what is abnormal. Pt has been weak for a few weeks. Pt is A&Ox4 during triage.

## 2024-06-19 ENCOUNTER — Inpatient Hospital Stay

## 2024-06-19 ENCOUNTER — Observation Stay

## 2024-06-19 DIAGNOSIS — N1330 Unspecified hydronephrosis: Secondary | ICD-10-CM | POA: Diagnosis not present

## 2024-06-19 DIAGNOSIS — N179 Acute kidney failure, unspecified: Secondary | ICD-10-CM

## 2024-06-19 DIAGNOSIS — D696 Thrombocytopenia, unspecified: Secondary | ICD-10-CM | POA: Diagnosis present

## 2024-06-19 DIAGNOSIS — K5730 Diverticulosis of large intestine without perforation or abscess without bleeding: Secondary | ICD-10-CM | POA: Diagnosis not present

## 2024-06-19 DIAGNOSIS — N281 Cyst of kidney, acquired: Secondary | ICD-10-CM | POA: Diagnosis not present

## 2024-06-19 DIAGNOSIS — N189 Chronic kidney disease, unspecified: Secondary | ICD-10-CM | POA: Diagnosis not present

## 2024-06-19 DIAGNOSIS — K7689 Other specified diseases of liver: Secondary | ICD-10-CM | POA: Diagnosis not present

## 2024-06-19 LAB — CBC
HCT: 23.4 % — ABNORMAL LOW (ref 36.0–46.0)
Hemoglobin: 7.7 g/dL — ABNORMAL LOW (ref 12.0–15.0)
MCH: 31 pg (ref 26.0–34.0)
MCHC: 32.9 g/dL (ref 30.0–36.0)
MCV: 94.4 fL (ref 80.0–100.0)
Platelets: 134 K/uL — ABNORMAL LOW (ref 150–400)
RBC: 2.48 MIL/uL — ABNORMAL LOW (ref 3.87–5.11)
RDW: 14.2 % (ref 11.5–15.5)
WBC: 4 K/uL (ref 4.0–10.5)
nRBC: 0 % (ref 0.0–0.2)

## 2024-06-19 LAB — BASIC METABOLIC PANEL WITH GFR
Anion gap: 10 (ref 5–15)
BUN: 53 mg/dL — ABNORMAL HIGH (ref 8–23)
CO2: 24 mmol/L (ref 22–32)
Calcium: 8.8 mg/dL — ABNORMAL LOW (ref 8.9–10.3)
Chloride: 108 mmol/L (ref 98–111)
Creatinine, Ser: 2.9 mg/dL — ABNORMAL HIGH (ref 0.44–1.00)
GFR, Estimated: 16 mL/min — ABNORMAL LOW (ref >60)
Glucose, Bld: 93 mg/dL (ref 70–99)
Potassium: 3.7 mmol/L (ref 3.5–5.1)
Sodium: 142 mmol/L (ref 135–145)

## 2024-06-19 LAB — IRON AND TIBC
Iron: 49 ug/dL (ref 28–170)
Saturation Ratios: 20 % (ref 10.4–31.8)
TIBC: 248 ug/dL — ABNORMAL LOW (ref 250–450)
UIBC: 199 ug/dL

## 2024-06-19 LAB — RETICULOCYTES
Immature Retic Fract: 12.5 % (ref 2.3–15.9)
RBC.: 2.66 MIL/uL — ABNORMAL LOW (ref 3.87–5.11)
Retic Count, Absolute: 38.8 K/uL (ref 19.0–186.0)
Retic Ct Pct: 1.5 % (ref 0.4–3.1)

## 2024-06-19 LAB — VITAMIN B12: Vitamin B-12: 411 pg/mL (ref 180–914)

## 2024-06-19 LAB — FERRITIN: Ferritin: 199 ng/mL (ref 11–307)

## 2024-06-19 LAB — FOLATE: Folate: >20 ng/mL (ref >5.9)

## 2024-06-19 MED ORDER — HYDRALAZINE HCL 50 MG PO TABS
50.0000 mg | ORAL_TABLET | Freq: Three times a day (TID) | ORAL | Status: DC
  Administered 2024-06-19 – 2024-06-20 (×4): 50 mg via ORAL
  Filled 2024-06-19 (×4): qty 1

## 2024-06-19 MED ORDER — ACETAMINOPHEN 325 MG PO TABS
325.0000 mg | ORAL_TABLET | Freq: Four times a day (QID) | ORAL | Status: DC | PRN
  Administered 2024-06-19 – 2024-06-20 (×2): 325 mg via ORAL
  Filled 2024-06-19 (×2): qty 1

## 2024-06-19 MED ORDER — SUCRALFATE 1 G PO TABS
1.0000 g | ORAL_TABLET | Freq: Three times a day (TID) | ORAL | Status: DC
  Administered 2024-06-19 – 2024-06-20 (×5): 1 g via ORAL
  Filled 2024-06-19 (×5): qty 1

## 2024-06-19 MED ORDER — METOPROLOL TARTRATE 5 MG/5ML IV SOLN: 5.0000 mg | Freq: Four times a day (QID) | INTRAVENOUS | Status: DC | PRN

## 2024-06-19 MED ORDER — DOXAZOSIN MESYLATE 2 MG PO TABS
2.0000 mg | ORAL_TABLET | Freq: Every day | ORAL | Status: DC
  Administered 2024-06-19 – 2024-06-20 (×2): 2 mg via ORAL
  Filled 2024-06-19 (×2): qty 1

## 2024-06-19 MED ORDER — AMIODARONE HCL 200 MG PO TABS
100.0000 mg | ORAL_TABLET | Freq: Every day | ORAL | Status: DC
  Administered 2024-06-19 – 2024-06-20 (×2): 100 mg via ORAL
  Filled 2024-06-19 (×2): qty 1

## 2024-06-19 MED ORDER — POLYETHYLENE GLYCOL 3350 17 G PO PACK: 17.0000 g | PACK | Freq: Every day | ORAL | Status: DC | PRN

## 2024-06-19 MED ORDER — ONDANSETRON HCL 4 MG PO TABS: 4.0000 mg | ORAL_TABLET | Freq: Three times a day (TID) | ORAL | Status: DC | PRN

## 2024-06-19 MED ORDER — APIXABAN 2.5 MG PO TABS
2.5000 mg | ORAL_TABLET | Freq: Two times a day (BID) | ORAL | Status: DC
Start: 2024-06-19 — End: 2024-06-20
  Administered 2024-06-19 – 2024-06-20 (×3): 2.5 mg via ORAL
  Filled 2024-06-19 (×3): qty 1

## 2024-06-19 MED ORDER — PANTOPRAZOLE SODIUM 40 MG PO TBEC
40.0000 mg | DELAYED_RELEASE_TABLET | Freq: Every day | ORAL | Status: DC
  Administered 2024-06-19 – 2024-06-20 (×2): 40 mg via ORAL
  Filled 2024-06-19 (×2): qty 1

## 2024-06-19 MED ORDER — LACTATED RINGERS IV SOLN
INTRAVENOUS | Status: AC
Start: 2024-06-19 — End: 2024-06-20

## 2024-06-19 MED ORDER — ROSUVASTATIN CALCIUM 10 MG PO TABS
10.0000 mg | ORAL_TABLET | Freq: Every day | ORAL | Status: DC
Start: 2024-06-19 — End: 2024-06-20
  Administered 2024-06-19 – 2024-06-20 (×2): 10 mg via ORAL
  Filled 2024-06-19 (×2): qty 1

## 2024-06-19 MED ORDER — GABAPENTIN 100 MG PO CAPS
100.0000 mg | ORAL_CAPSULE | Freq: Two times a day (BID) | ORAL | Status: DC
Start: 2024-06-19 — End: 2024-06-20
  Administered 2024-06-19 – 2024-06-20 (×3): 100 mg via ORAL
  Filled 2024-06-19 (×3): qty 1

## 2024-06-19 NOTE — Assessment & Plan Note (Signed)
 Continue statin

## 2024-06-19 NOTE — Assessment & Plan Note (Addendum)
 Mildly elevated BNP Has some lower extremity edema but no evidence of overt pulmonary edema. Holding Lasix  Gentle IV hydration

## 2024-06-19 NOTE — Assessment & Plan Note (Signed)
 Baseline hemoglobin appears to be hovering around 9 or 10 Senna 8.3 Check anemia panel Replete as needed May be related to worsening renal function

## 2024-06-19 NOTE — ED Notes (Signed)
 Patient transported to [employer] via wheelchair.

## 2024-06-19 NOTE — Care Management Obs Status (Signed)
 MEDICARE OBSERVATION STATUS NOTIFICATION   Patient Details  Name: [name] MRN: [id number] Date of Birth: [birth year]   Medicare Observation Status Notification Given:  Yes    [name], LCSW 06/19/2024, 1:39 PM

## 2024-06-19 NOTE — Assessment & Plan Note (Signed)
 Seems persistent for at least the last year or so.

## 2024-06-19 NOTE — Assessment & Plan Note (Deleted)
 Baseline creatinine hovers around 1.9. Today's creatinine is up closer to 3. BUN is also elevated at 58 Patient sees nephrology, consider nephrology consultation in the morning. Check renal ultrasound Holding nephrotoxic agents including an ARB Trend

## 2024-06-19 NOTE — Care Management CC44 (Signed)
 Condition Code 44 Documentation Completed  Patient Details  Name: [name] MRN: [id number] Date of Birth: [birth year]   Condition Code 44 given:  Yes Patient signature on Condition Code 44 notice:  Yes Documentation of 2 MD's agreement:  Yes Code 44 added to claim:  Yes    [name], LCSW 06/19/2024, 1:39 PM

## 2024-06-19 NOTE — Progress Notes (Signed)
 [facility]  ROUNDING NOTE   Subjective:   [name] is a 83 y.o. female with past medical conditions including hypertension, dementia, atrial fibrillation, congestive heart failure, hyperlipidemia, rheumatoid arthritis, glaucoma and chronic kidney disease stage IV. Patient presents to ED with abnormal labs and has been admitted for AKI (acute kidney injury) [facility]) [N17.9]  Patient is known to our practice and is followed by Dr Douglas. Patient was recently seen on 9/8 in office. She states she received a call from her PCP due to abnormal labs. Patient has been experiencing decreased oral intake due to the recent passing of her sister. Denies nausea or vomiting. Denies shortness of breath. Denies NSAID use.    Labs on ED arrival concerning for BUN 52, creatinine 3.24 with GFR 12. Baseline creatinine appears to be 1.99 on 5/20. BNP 305. Chest xray negative for acute findings. Renal US  shows mild right Hydronephrosis with anechoic cysts. Creatinine has shown improved, 2.9 today.   We have been consulted to manage acute kidney injury.    Objective:  Vital signs in last 24 hours:  Temp:  [98 F (36.7 C)-99.2 F (37.3 C)] 98.9 F (37.2 C) (09/10 1129) Pulse Rate:  [49-72] 59 (09/10 1129) Resp:  [16-20] 16 (09/10 1129) BP: (102-135)/(46-115) 112/64 (09/10 1129) SpO2:  [97 %-100 %] 100 % (09/10 1129) Weight:  [59 kg] 59 kg (09/09 1720)  Weight change:  Filed Weights   06/18/24 1720  Weight: 59 kg    Intake/Output: No intake/output data recorded.   Intake/Output this shift:  No intake/output data recorded.  Physical Exam: General: NAD, frail  Head: Normocephalic, atraumatic. Moist oral mucosal membranes  Eyes: Anicteric  Neck: Supple  Lungs:  Clear to auscultation, normal effort  Heart: Regular rate and rhythm  Abdomen:  Soft, nontender  Extremities:  1+ peripheral edema.  Neurologic: Awake, alert, conversant  Skin: Warm,dry, no rash       Basic  Metabolic Panel: Recent Labs  Lab 06/18/24 1049 06/18/24 1723 06/19/24 0459  NA 139 140 142  K 4.0 3.7 3.7  CL 105 109 108  CO2 26 22 24   GLUCOSE 88 105* 93  BUN 52* 58* 53*  CREATININE 3.24* 2.96* 2.90*  CALCIUM  9.0 9.0 8.8*    Liver Function Tests: Recent Labs  Lab 06/18/24 1049 06/18/24 1723  AST 15 19  ALT 11 13  ALKPHOS 42 45  BILITOT 0.4 0.8  PROT 6.3 6.9  ALBUMIN 3.8 3.6   No results for input(s): LIPASE, AMYLASE in the last 168 hours. No results for input(s): AMMONIA in the last 168 hours.  CBC: Recent Labs  Lab 06/18/24 1049 06/18/24 1723 06/19/24 0459  WBC 4.6 5.0 4.0  NEUTROABS 2.8  --   --   HGB 8.4* 8.3* 7.7*  HCT 25.5* 24.8* 23.4*  MCV 94.1 94.7 94.4  PLT 138.0* 143* 134*    Cardiac Enzymes: No results for input(s): CKTOTAL, CKMB, CKMBINDEX, TROPONINI in the last 168 hours.  BNP: Invalid input(s): POCBNP  CBG: Recent Labs  Lab 06/18/24 1722  GLUCAP 95    Microbiology: Results for orders placed or performed during the hospital encounter of 08/17/21  Resp Panel by RT-PCR (Flu A&B, Covid) Nasopharyngeal Swab     Status: None   Collection Time: 08/17/21  2:55 PM   Specimen: Nasopharyngeal Swab; Nasopharyngeal(NP) swabs in vial transport medium  Result Value Ref Range Status   SARS Coronavirus 2 by RT PCR NEGATIVE NEGATIVE Final    Comment: (NOTE)  SARS-CoV-2 target nucleic acids are NOT DETECTED.  The SARS-CoV-2 RNA is generally detectable in upper respiratory specimens during the acute phase of infection. The lowest concentration of SARS-CoV-2 viral copies this assay can detect is 138 copies/mL. A negative result does not preclude SARS-Cov-2 infection and should not be used as the sole basis for treatment or other patient management decisions. A negative result may occur with  improper specimen collection/handling, submission of specimen other than nasopharyngeal swab, presence of viral mutation(s) within the areas  targeted by this assay, and inadequate number of viral copies(<138 copies/mL). A negative result must be combined with clinical observations, patient history, and epidemiological information. The expected result is Negative.  Fact Sheet for Patients:  BloggerCourse.com  Fact Sheet for Healthcare Providers:  SeriousBroker.it  This test is no t yet approved or cleared by the [place]  FDA and  has been authorized for detection and/or diagnosis of SARS-CoV-2 by FDA under an Emergency Use Authorization (EUA). This EUA will remain  in effect (meaning this test can be used) for the duration of the COVID-19 declaration under Section 564(b)(1) of the Act, 21 U.S.C.section 360bbb-3(b)(1), unless the authorization is terminated  or revoked sooner.       Influenza A by PCR NEGATIVE NEGATIVE Final   Influenza B by PCR NEGATIVE NEGATIVE Final    Comment: (NOTE) The Xpert Xpress SARS-CoV-2/FLU/RSV plus assay is intended as an aid in the diagnosis of influenza from Nasopharyngeal swab specimens and should not be used as a sole basis for treatment. Nasal washings and aspirates are unacceptable for Xpert Xpress SARS-CoV-2/FLU/RSV testing.  Fact Sheet for Patients: BloggerCourse.com  Fact Sheet for Healthcare Providers: SeriousBroker.it  This test is not yet approved or cleared by the [place]  FDA and has been authorized for detection and/or diagnosis of SARS-CoV-2 by FDA under an Emergency Use Authorization (EUA). This EUA will remain in effect (meaning this test can be used) for the duration of the COVID-19 declaration under Section 564(b)(1) of the Act, 21 U.S.C. section 360bbb-3(b)(1), unless the authorization is terminated or revoked.  Performed at [facility], [address] [place], [place] [address]     Coagulation Studies: Recent Labs    06/18/24 1723   LABPROT 14.3  INR 1.1    Urinalysis: Recent Labs    06/18/24 2328  COLORURINE YELLOW*  LABSPEC 1.011  PHURINE 5.0  GLUCOSEU NEGATIVE  HGBUR NEGATIVE  BILIRUBINUR NEGATIVE  KETONESUR NEGATIVE  PROTEINUR NEGATIVE  NITRITE NEGATIVE  LEUKOCYTESUR TRACE*      Imaging: US  Renal Result Date: 06/19/2024 EXAM: US  Retroperitoneum Complete, Renal. CLINICAL HISTORY: AKI on CKD. TECHNIQUE: Real-time ultrasound of the retroperitoneum (complete) with image documentation. COMPARISON: [name] abdomen 02/17/2023. FINDINGS: RIGHT KIDNEY: The right kidney measures 7.2 x 3.4 x 4.4 cm with a volume of 55.8 cc. There is mild right hydronephrosis. Increased parenchymal echogenicity compatible with chronic medical renal disease. There are 2 anechoic cysts within the interpolar region of the right kidney. These measure up to 1.3 cm. LEFT KIDNEY: The left kidney measures 8.6 x 4.7 x 5.0 cm. The volume of the left kidney is 104.8 ml. There is no hydronephrosis. Diffuse increased parenchymal echogenicity. There are 2 anechoic cysts within the left kidney. The largest is within the interpolar region measuring 4.8 cm. BLADDER: The urinary bladder appears normal. IMPRESSION: 1. Mild right hydronephrosis. 2. Increased parenchymal echogenicity in both kidneys, compatible with chronic medical renal disease. 3. Anechoic cysts in both kidneys, largest measuring 4.8 cm in the left  kidney. Electronically signed by: Waddell Calk MD 06/19/2024 05:01 AM EDT RP Workstation: [id number]   DG Chest Portable 1 View Result Date: 06/18/2024 EXAM: 1 VIEW XRAY OF THE CHEST 06/18/2024 11:11:58 PM COMPARISON: 02/14/2022. CLINICAL HISTORY: Peripheral edema. FINDINGS: LUNGS AND PLEURA: No focal pulmonary opacity. Scarring in the left mid lung. No pulmonary edema. No pleural effusion. No pneumothorax. HEART AND MEDIASTINUM: Cardiomegaly. Aortic calcification. BONES AND SOFT TISSUES: No acute osseous abnormality. IMPRESSION: 1. No acute  cardiopulmonary process. Cardiomegaly. Electronically signed by: Norman Gatlin MD 06/18/2024 11:15 PM EDT RP Workstation: [id number]     Medications:    lactated ringers  40 mL/hr at 06/19/24 0516    amiodarone   100 mg Oral Daily   apixaban   2.5 mg Oral BID   doxazosin   2 mg Oral Daily   gabapentin   100 mg Oral BID   hydrALAZINE   50 mg Oral Q8H   pantoprazole   40 mg Oral Daily   rosuvastatin   10 mg Oral Daily   sucralfate   1 g Oral TID WC & HS   acetaminophen , metoprolol  tartrate, ondansetron , polyethylene glycol  Assessment/ Plan:  Ms. DAPHANE ODEKIRK is a 83 y.o.  female with past medical conditions including hypertension, dementia, atrial fibrillation, congestive heart failure, hyperlipidemia, rheumatoid arthritis, glaucoma and chronic kidney disease stage IV. Patient presents to ED with abnormal labs and has been admitted for AKI (acute kidney injury) (HCC) [N17.9]   Acute Kidney Injury on chronic kidney disease stage IV with baseline creatinine 1.99 on 02/27/24.  Acute kidney injury secondary to  Chronic kidney disease is secondary to Hypertension Renal ultrasound shows mild obstruction. Renal function is 3.24 on admission. Creatinine has improved since admission. Has received IVF. Continue to encourage oral intake and will monitor. No acute indication for dialysis.   Lab Results  Component Value Date   CREATININE 2.90 (H) 06/19/2024   CREATININE 2.96 (H) 06/18/2024   CREATININE 3.24 (H) 06/18/2024   No intake or output data in the 24 hours ending 06/19/24 1148  2. Anemia of chronic kidney disease Lab Results  Component Value Date   HGB 7.7 (L) 06/19/2024    Hgb decreased, will consider ESA.  3. Secondary Hyperparathyroidism: with outpatient labs: None available  Lab Results  Component Value Date   CALCIUM  8.8 (L) 06/19/2024    Will continue to monitor bone minerals.   4. Hypertension with chronic kidney injury. Home regimen includes doxazosin , hydralazine ,  lisinopril-hydrochlorothiazide .    LOS: 1 Andora Krull 9/10/202511:48 AM

## 2024-06-19 NOTE — Hospital Course (Signed)
 Patient is a 83 year old with history of HTN, history of rheumatoid arthritis, GERD, CKD, dementia HLD, valvular heart disease, coronary artery disease, persistent A-fib on Eliquis , who presents today with worsening kidney function found at the primary care office and was sent in for further evaluation and workup.  Patient was initially seen for increased ankle edema and labs noted a bump in her creatinine as well as her BUN.  Family reports she has been increasingly slow and more forgetful. In the ED she was noted to be moderately anemic with a hemoglobin of 8.3 and also thrombocytopenic with platelet count of 143.  Her BUN was 58 and her creatinine was 2.96.  Her BNP was noted to be 305 but chest x-ray was negative.  Her oxygen saturations were normal.  We were asked to admit for renal consultation and renal ultrasound and further workup.

## 2024-06-19 NOTE — ED Provider Notes (Incomplete)
 [facility] Provider Note    Event Date/Time   First MD Initiated Contact with Patient 06/18/24 2156     (approximate)   History   Abnormal Lab and Weakness  Pt arrives via POV with c/p abnormal labs. Pt & family state that the kidney labs are abnormal but is really unsure what is abnormal. Pt has been weak for a few weeks. Pt is A&Ox4 during triage.    HPI [name] is a 83 y.o. female PMH multiple medical comorbidities including Alzheimer's, CKD, A-fib on Eliquis , hyperlipidemia, sinus bradycardia presents for evaluation of her abnormal outpatient labs -Per chart review, patient was seen in family medicine clinic yesterday after noting recent increase in peripheral swelling.  Has been taking furosemide  as prescribed, was previously taking as needed but now daily.  Continue to have worsening swelling.  Labs collected, subsequently showed AKI on CKD, creatinine 3.2, baseline around 2.  Associated uremia.  BMP stable.  CBC overall stable, mildly decreased from prior, suspect dilutional. TSH wnl.  Patient notified, recommended ED evaluation. - On my evaluation, patient has no specific complaints.  Appears she has been having worsening bilateral lower extremity edema over the past week or so.  Family has also noted that she is slightly more confused than usual, more forgetful, here over the past several days. - No fever or other infectious symptoms - Confirms she has been taking her Lasix  as prescribed - Family does note patient has been having some difficulty urinating recently      Physical Exam   Triage Vital Signs: ED Triage Vitals  Encounter Vitals Group     BP 06/18/24 1719 122/60     Girls Systolic BP Percentile --      Girls Diastolic BP Percentile --      Boys Systolic BP Percentile --      Boys Diastolic BP Percentile --      Pulse Rate 06/18/24 1719 (!) 58     Resp 06/18/24 1719 18     Temp 06/18/24 1719 98.5 F (36.9 C)     Temp  Source 06/18/24 1719 Oral     SpO2 06/18/24 1719 97 %     Weight 06/18/24 1720 130 lb (59 kg)     Height 06/18/24 1720 5' 6 (1.676 m)     Head Circumference --      Peak Flow --      Pain Score 06/18/24 1719 0     Pain Loc --      Pain Education --      Exclude from Growth Chart --     Most recent vital signs: Vitals:   06/18/24 1719  BP: 122/60  Pulse: (!) 58  Resp: 18  Temp: 98.5 F (36.9 C)  SpO2: 97%     General: Awake, no distress.  CV:  Good peripheral perfusion. RRR, RP 2+. 1+ Resp:  Normal effort. CTAB Abd:  No distention. Nontender to deep palpation throughout Neuro:  Aox2, no facial asymmetry, moving all extremities spontaneously, no focal motor deficit appreciated   ED Results / Procedures / Treatments   Labs (all labs ordered are listed, but only abnormal results are displayed) Labs Reviewed  COMPREHENSIVE METABOLIC PANEL WITH GFR - Abnormal; Notable for the following components:      Result Value   Glucose, Bld 105 (*)    BUN 58 (*)    Creatinine, Ser 2.96 (*)    GFR, Estimated 15 (*)    All  other components within normal limits  CBC - Abnormal; Notable for the following components:   RBC 2.62 (*)    Hemoglobin 8.3 (*)    HCT 24.8 (*)    Platelets 143 (*)    All other components within normal limits  URINALYSIS, ROUTINE W REFLEX MICROSCOPIC - Abnormal; Notable for the following components:   Color, Urine YELLOW (*)    APPearance CLEAR (*)    Leukocytes,Ua TRACE (*)    Bacteria, UA RARE (*)    All other components within normal limits  PROTIME-INR  CBG MONITORING, ED     EKG  Ecg = sinus bradycardia versus junctional rhythm, rate 58, no gross ST elevation or depression, no significant repolarization abnormality, normal axis, normal intervals.  No evidence of ischemia no arrhythmia on my interpretation.   RADIOLOGY Radiology interpreted by myself radiology report reviewed.  Cardiomegaly.  No acute pathology.    PROCEDURES:  Critical  Care performed: No  Procedures   MEDICATIONS ORDERED IN ED: Medications - No data to display   IMPRESSION / MDM / ASSESSMENT AND PLAN / ED COURSE  I reviewed the triage vital signs and the nursing notes.                              DDX/MDM/AP: Differential diagnosis includes, but is not limited to, worsening renal failure, consider obstructive uropathy, less likely CHF given normal BNP earlier today though will add chest x-ray for further evaluation.  Suspect mild change in mental status may be due to somewhat worsened uremia, consider underlying UTI.  Plan: - Labs - Chest x-ray - Bladder scan - EKG - Reassess  Patient's presentation is most consistent with acute presentation with potential threat to life or bodily function.  The patient is on the cardiac monitor to evaluate for evidence of arrhythmia and/or significant heart rate changes.  ED course below.  Admitted to hospitalist service for further evaluation of AKI and CKD.  Deferring diuresis at this time.  Renal ultrasound pending.  Can discuss with nephrology in a.m..  Suspect worsening uremia secondary to worsening renal function as likely cause of patient's mild altered mental state.  Urinalysis pending.  Clinical Course as of 06/19/24 0004  Tue Jun 18, 2024  2240 CMP with persistent AKI  Hemoglobin within baseline range [MM]  2317 Bladder scan 177 cc PCR, not consistent with urinary retention  Do not suspect obstructive uropathy as etiology [MM]  2318 Chest x-ray with cardiomegaly but otherwise unremarkable [MM]  2318 Hospitalist consult order placed [MM]    Clinical Course User Index [MM] Clarine Ozell LABOR, MD     FINAL CLINICAL IMPRESSION(S) / ED DIAGNOSES   Final diagnoses:  None     Rx / DC Orders   ED Discharge Orders     None        Note:  This document was prepared using Dragon voice recognition software and may include unintentional dictation errors.

## 2024-06-19 NOTE — Assessment & Plan Note (Addendum)
 Baseline creatinine hovers around 1.9. Today's creatinine is up closer to 3. BUN is also elevated at 58 with some possible mild uremic symptoms Patient sees nephrology, consider nephrology consultation in the morning. Check renal ultrasound Gentle IV hydration at 40 cc/h x 24 hours only Holding nephrotoxic agents including an ARB Trend

## 2024-06-19 NOTE — Assessment & Plan Note (Signed)
 Currently on no meds and does not appear active.

## 2024-06-19 NOTE — Progress Notes (Signed)
  Progress Note   Patient: [name] FMW:[id number] DOB: 1940-10-28 DOA: 06/18/2024     1 DOS: the patient was seen and examined on 06/19/2024   Brief hospital course:  83 y.o. female with past medical conditions including hypertension, dementia, atrial fibrillation, congestive heart failure, hyperlipidemia, rheumatoid arthritis, glaucoma and chronic kidney disease stage IV. Patient presented to ED with abnormal labs/worsening creatinine. Nephrology consulted.  Assessment and Plan:    Acute kidney injury superimposed on chronic kidney disease stage 4,POA Baseline creatinine hovers around 1.9. On admission, creatinine is up closer to 3.2. Slightly improved today. Renal ultrasound showed mild right hydronephrosis. There is evidence of b/l medial renal disease. Received Gentle IV hydration, dced now Holding nephrotoxic agents including an ARB Consulted nephrology No acute indication for HD initiation.   Chronic diastolic CHF,POA: NYHA III. Not in exacerbation. Mildly elevated BNP Has some lower extremity edema but no evidence of overt pulmonary edema. Holding Lasix  Gentle IV hydration, dced now.   Anemia of chronic kidney disease IV, POA: Baseline hemoglobin appears to be hovering around 9 or 10 May need ESA   Thrombocytopenia Chronic. No evidence of bleeding.   History of rheumatoid arthritis Currently, she is on no meds and does not appear active.   Mitral regurgitation Moderate on last echo   Aortic valve insufficiency Last echo was in 2023.  This showed moderate mitral regurg and moderate aortic regurg as well.  The right and left atria were severely dilated.  EF was 60 to 65%.      Subjective: No acute events overnight. Denies chest pain or shortness of breath. Admits decreased appetite since her sister passed away couple of weeks back. Nephrologist, Dr Marcelino consulted.  Physical Exam: Vitals:   06/19/24 0813 06/19/24 0830 06/19/24 0900 06/19/24 1129  BP:   (!) 123/57 (!) 125/53 112/64  Pulse:  (!) 54 (!) 57 (!) 59  Resp:    16  Temp: 99.2 F (37.3 C)   98.9 F (37.2 C)  TempSrc: Oral   Oral  SpO2:  99% 100% 100%  Weight:      Height:       Constitutional: NAD, calm, comfortable Eyes: PERRL, lids and conjunctivae normal ENMT: Mucous membranes are moist. Posterior pharynx clear of any exudate or lesions.Normal dentition.  Neck: normal, supple, no masses, no thyromegaly Respiratory: clear to auscultation bilaterally, no wheezing, no crackles. Normal respiratory effort. No accessory muscle use.  Cardiovascular: Regular rate and rhythm, no murmurs / rubs / gallops. 1+ b/l extremity edema. 2+ pedal pulses. No carotid bruits.  Abdomen: no tenderness, no masses palpated. No hepatosplenomegaly. Bowel sounds positive.  Musculoskeletal: no clubbing / cyanosis. No joint deformity upper and lower extremities. Good ROM, no contractures. Normal muscle tone.  Skin: no rashes, lesions, ulcers. No induration Neurologic: CN 2-12 grossly intact. Sensation intact, DTR normal. Strength 5/5 x all 4 extremities.  Psychiatric: Normal judgment and insight. Alert and oriented x 3. Normal mood.   Data Reviewed:  There are no new results to review at this time.  Family Communication: Husband at the bedside  Disposition: Status is: Observation The patient remains OBS appropriate and will d/c before 2 midnights.  Planned Discharge Destination: Home with Home Health    Time spent: 41 minutes  Author: Deliliah Room, MD 06/19/2024 1:08 PM  For on call review [email or web address].

## 2024-06-19 NOTE — ED Notes (Signed)
 Pt given non skid socks and this RN escorted pt to bathroom via wheelchair. Pt ambulatory with assistance of this RN. Pt back in bed with no complaints. Pt given warm blanket. Pts daughter Monta given recliner and blanket. VSS. NAD.

## 2024-06-19 NOTE — ED Notes (Signed)
 Patient ambulatory to the restroom without incident. Back to bed and monitor applied.

## 2024-06-19 NOTE — Assessment & Plan Note (Signed)
 Last echo was in 2023.  This showed moderate mitral regurg and moderate aortic regurg as well.  The right and left atria were severely dilated.  EF was 60 to 65%.

## 2024-06-19 NOTE — Assessment & Plan Note (Signed)
 Moderate on last echo

## 2024-06-20 DIAGNOSIS — N2581 Secondary hyperparathyroidism of renal origin: Secondary | ICD-10-CM | POA: Diagnosis not present

## 2024-06-20 DIAGNOSIS — N179 Acute kidney failure, unspecified: Secondary | ICD-10-CM | POA: Diagnosis not present

## 2024-06-20 DIAGNOSIS — D631 Anemia in chronic kidney disease: Secondary | ICD-10-CM | POA: Diagnosis not present

## 2024-06-20 DIAGNOSIS — N184 Chronic kidney disease, stage 4 (severe): Secondary | ICD-10-CM | POA: Diagnosis not present

## 2024-06-20 LAB — RENAL FUNCTION PANEL
Albumin: 3 g/dL — ABNORMAL LOW (ref 3.5–5.0)
Anion gap: 6 (ref 5–15)
BUN: 42 mg/dL — ABNORMAL HIGH (ref 8–23)
CO2: 24 mmol/L (ref 22–32)
Calcium: 9.3 mg/dL (ref 8.9–10.3)
Chloride: 110 mmol/L (ref 98–111)
Creatinine, Ser: 2.29 mg/dL — ABNORMAL HIGH (ref 0.44–1.00)
GFR, Estimated: 21 mL/min — ABNORMAL LOW (ref >60)
Glucose, Bld: 138 mg/dL — ABNORMAL HIGH (ref 70–99)
Phosphorus: 2.4 mg/dL — ABNORMAL LOW (ref 2.5–4.6)
Potassium: 3.8 mmol/L (ref 3.5–5.1)
Sodium: 140 mmol/L (ref 135–145)

## 2024-06-20 MED ORDER — LACTATED RINGERS IV SOLN: INTRAVENOUS | Status: DC

## 2024-06-20 NOTE — TOC CM/SW Note (Signed)
 Transition of Care [facility]) - Inpatient Brief Assessment   Patient Details  Name: [name] MRN: [id number] Date of Birth: 30-Mar-1941  Transition of Care [facility]) CM/SW Contact:    Corean ONEIDA Haddock, RN Phone Number: 06/20/2024, 12:10 PM   Clinical Narrative:    Transition of Care [facility]) Screening Note   Patient Details  Name: [name] Date of Birth: 10/04/1941   Transition of Care [facility]) CM/SW Contact:    Corean ONEIDA Haddock, RN Phone Number: 06/20/2024, 12:10 PM    Transition of Care Department [facility]) has reviewed patient and no TOC needs have been identified at this time. If new patient transition needs arise, please place a TOC consult.   Transition of Care Asessment: Insurance and Status: Insurance coverage has been reviewed Patient has primary care physician: Yes     Prior/Current Home Services: No current home services Social Drivers of Health Review: SDOH reviewed no interventions necessary Readmission risk has been reviewed: Yes Transition of care needs: no transition of care needs at this time

## 2024-06-20 NOTE — Progress Notes (Signed)
 [facility]  ROUNDING NOTE   Subjective:   [name] is a 83 y.o. female with past medical conditions including hypertension, dementia, atrial fibrillation, congestive heart failure, hyperlipidemia, rheumatoid arthritis, glaucoma and chronic kidney disease stage IV. Patient presents to ED with abnormal labs and has been admitted for Peripheral edema [R60.0] Uremia [N19] AKI (acute kidney injury) [facility]) [N17.9]  Patient is known to our practice and is followed by Dr Douglas. Patient was recently seen on 9/8 in office. She states she received a call from her PCP due to abnormal labs.   Update: Patient resting quietly No complaints to offer Partially completed breakfast tray at bedside  Creatinine 2.29   Objective:  Vital signs in last 24 hours:  Temp:  [98.3 F (36.8 C)-98.9 F (37.2 C)] 98.3 F (36.8 C) (09/11 0749) Pulse Rate:  [51-62] 55 (09/11 0749) Resp:  [16-19] 16 (09/11 0749) BP: (95-151)/(47-68) 118/56 (09/11 0749) SpO2:  [98 %-100 %] 98 % (09/11 0749)  Weight change:  Filed Weights   06/18/24 1720  Weight: 59 kg    Intake/Output: No intake/output data recorded.   Intake/Output this shift:  Total I/O In: 190 [P.O.:190] Out: -   Physical Exam: General: NAD, frail  Head: Normocephalic, atraumatic. Moist oral mucosal membranes  Eyes: Anicteric  Neck: Supple  Lungs:  Clear to auscultation, normal effort  Heart: Regular rate and rhythm  Abdomen:  Soft, nontender  Extremities:  1+ peripheral edema.  Neurologic: Awake, alert, conversant  Skin: Warm,dry, no rash       Basic Metabolic Panel: Recent Labs  Lab 06/18/24 1049 06/18/24 1723 06/19/24 0459 06/20/24 0953  NA 139 140 142 140  K 4.0 3.7 3.7 3.8  CL 105 109 108 110  CO2 26 22 24 24   GLUCOSE 88 105* 93 138*  BUN 52* 58* 53* 42*  CREATININE 3.24* 2.96* 2.90* 2.29*  CALCIUM  9.0 9.0 8.8* 9.3  PHOS  --   --   --  2.4*    Liver Function Tests: Recent Labs  Lab  06/18/24 1049 06/18/24 1723 06/20/24 0953  AST 15 19  --   ALT 11 13  --   ALKPHOS 42 45  --   BILITOT 0.4 0.8  --   PROT 6.3 6.9  --   ALBUMIN 3.8 3.6 3.0*   No results for input(s): LIPASE, AMYLASE in the last 168 hours. No results for input(s): AMMONIA in the last 168 hours.  CBC: Recent Labs  Lab 06/18/24 1049 06/18/24 1723 06/19/24 0459  WBC 4.6 5.0 4.0  NEUTROABS 2.8  --   --   HGB 8.4* 8.3* 7.7*  HCT 25.5* 24.8* 23.4*  MCV 94.1 94.7 94.4  PLT 138.0* 143* 134*    Cardiac Enzymes: No results for input(s): CKTOTAL, CKMB, CKMBINDEX, TROPONINI in the last 168 hours.  BNP: Invalid input(s): POCBNP  CBG: Recent Labs  Lab 06/18/24 1722  GLUCAP 95    Microbiology: Results for orders placed or performed during the hospital encounter of 08/17/21  Resp Panel by RT-PCR (Flu A&B, Covid) Nasopharyngeal Swab     Status: None   Collection Time: 08/17/21  2:55 PM   Specimen: Nasopharyngeal Swab; Nasopharyngeal(NP) swabs in vial transport medium  Result Value Ref Range Status   SARS Coronavirus 2 by RT PCR NEGATIVE NEGATIVE Final    Comment: (NOTE) SARS-CoV-2 target nucleic acids are NOT DETECTED.  The SARS-CoV-2 RNA is generally detectable in upper respiratory specimens during the acute phase of infection. The  lowest concentration of SARS-CoV-2 viral copies this assay can detect is 138 copies/mL. A negative result does not preclude SARS-Cov-2 infection and should not be used as the sole basis for treatment or other patient management decisions. A negative result may occur with  improper specimen collection/handling, submission of specimen other than nasopharyngeal swab, presence of viral mutation(s) within the areas targeted by this assay, and inadequate number of viral copies(<138 copies/mL). A negative result must be combined with clinical observations, patient history, and epidemiological information. The expected result is Negative.  Fact Sheet  for Patients:  BloggerCourse.com  Fact Sheet for Healthcare Providers:  SeriousBroker.it  This test is no t yet approved or cleared by the [place]  FDA and  has been authorized for detection and/or diagnosis of SARS-CoV-2 by FDA under an Emergency Use Authorization (EUA). This EUA will remain  in effect (meaning this test can be used) for the duration of the COVID-19 declaration under Section 564(b)(1) of the Act, 21 U.S.C.section 360bbb-3(b)(1), unless the authorization is terminated  or revoked sooner.       Influenza A by PCR NEGATIVE NEGATIVE Final   Influenza B by PCR NEGATIVE NEGATIVE Final    Comment: (NOTE) The Xpert Xpress SARS-CoV-2/FLU/RSV plus assay is intended as an aid in the diagnosis of influenza from Nasopharyngeal swab specimens and should not be used as a sole basis for treatment. Nasal washings and aspirates are unacceptable for Xpert Xpress SARS-CoV-2/FLU/RSV testing.  Fact Sheet for Patients: BloggerCourse.com  Fact Sheet for Healthcare Providers: SeriousBroker.it  This test is not yet approved or cleared by the [place]  FDA and has been authorized for detection and/or diagnosis of SARS-CoV-2 by FDA under an Emergency Use Authorization (EUA). This EUA will remain in effect (meaning this test can be used) for the duration of the COVID-19 declaration under Section 564(b)(1) of the Act, 21 U.S.C. section 360bbb-3(b)(1), unless the authorization is terminated or revoked.  Performed at [facility], [address] [place], [place] [address]     Coagulation Studies: Recent Labs    06/18/24 1723  LABPROT 14.3  INR 1.1    Urinalysis: Recent Labs    06/18/24 2328  COLORURINE YELLOW*  LABSPEC 1.011  PHURINE 5.0  GLUCOSEU NEGATIVE  HGBUR NEGATIVE  BILIRUBINUR NEGATIVE  KETONESUR NEGATIVE  PROTEINUR NEGATIVE  NITRITE NEGATIVE   LEUKOCYTESUR TRACE*      Imaging: CT RENAL STONE STUDY Result Date: 06/19/2024 CLINICAL DATA:  Right-sided hydronephrosis. Concern for kidney stone. EXAM: CT ABDOMEN AND PELVIS WITHOUT CONTRAST TECHNIQUE: Multidetector CT imaging of the abdomen and pelvis was performed following the standard protocol without IV contrast. RADIATION DOSE REDUCTION: This exam was performed according to the departmental dose-optimization program which includes automated exposure control, adjustment of the mA and/or kV according to patient size and/or use of iterative reconstruction technique. COMPARISON:  CT abdomen pelvis dated 03/22/2022. FINDINGS: Evaluation of this exam is limited in the absence of intravenous contrast. Lower chest: Bibasilar linear atelectasis/scarring. No intra-abdominal free air or free fluid. Hepatobiliary: There is a 6.5 cm cyst in the left lobe of the liver. No biliary dilatation. The gallbladder is unremarkable. Pancreas: Unremarkable. No pancreatic ductal dilatation or surrounding inflammatory changes. Spleen: Normal in size without focal abnormality. Adrenals/Urinary Tract: The adrenal glands unremarkable. There is a 4.5 cm left renal interpolar cyst. 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 The urinary bladder is grossly unremarkable. Stomach/Bowel: There is moderate stool throughout the colon. Mild sigmoid diverticulosis. There is no bowel obstruction  or active inflammation. The appendix is normal. Vascular/Lymphatic: Moderate aortoiliac atherosclerotic disease. The IVC is unremarkable. No portal venous gas. There is no adenopathy. Reproductive: Hysterectomy. Other: Mild diffuse subcutaneous edema.  No fluid collection. Musculoskeletal: Degenerative changes of the spine. No acute osseous pathology. IMPRESSION: 1. No acute intra-abdominal or pelvic pathology. No hydronephrosis or nephrolithiasis. 2. Mild sigmoid diverticulosis. No bowel obstruction. Normal appendix. 3.  Aortic  Atherosclerosis (ICD10-I70.0). Electronically Signed   By: Vanetta Chou M.D.   On: 06/19/2024 18:03   US  Renal Result Date: 06/19/2024 EXAM: US  Retroperitoneum Complete, Renal. CLINICAL HISTORY: AKI on CKD. TECHNIQUE: Real-time ultrasound of the retroperitoneum (complete) with image documentation. COMPARISON: [name] abdomen 02/17/2023. FINDINGS: RIGHT KIDNEY: The right kidney measures 7.2 x 3.4 x 4.4 cm with a volume of 55.8 cc. There is mild right hydronephrosis. Increased parenchymal echogenicity compatible with chronic medical renal disease. There are 2 anechoic cysts within the interpolar region of the right kidney. These measure up to 1.3 cm. LEFT KIDNEY: The left kidney measures 8.6 x 4.7 x 5.0 cm. The volume of the left kidney is 104.8 ml. There is no hydronephrosis. Diffuse increased parenchymal echogenicity. There are 2 anechoic cysts within the left kidney. The largest is within the interpolar region measuring 4.8 cm. BLADDER: The urinary bladder appears normal. IMPRESSION: 1. Mild right hydronephrosis. 2. Increased parenchymal echogenicity in both kidneys, compatible with chronic medical renal disease. 3. Anechoic cysts in both kidneys, largest measuring 4.8 cm in the left kidney. Electronically signed by: Waddell Calk MD 06/19/2024 05:01 AM EDT RP Workstation: [id number]   DG Chest Portable 1 View Result Date: 06/18/2024 EXAM: 1 VIEW XRAY OF THE CHEST 06/18/2024 11:11:58 PM COMPARISON: 02/14/2022. CLINICAL HISTORY: Peripheral edema. FINDINGS: LUNGS AND PLEURA: No focal pulmonary opacity. Scarring in the left mid lung. No pulmonary edema. No pleural effusion. No pneumothorax. HEART AND MEDIASTINUM: Cardiomegaly. Aortic calcification. BONES AND SOFT TISSUES: No acute osseous abnormality. IMPRESSION: 1. No acute cardiopulmonary process. Cardiomegaly. Electronically signed by: Norman Gatlin MD 06/18/2024 11:15 PM EDT RP Workstation: [id number]     Medications:    lactated ringers        amiodarone   100 mg Oral Daily   apixaban   2.5 mg Oral BID   doxazosin   2 mg Oral Daily   gabapentin   100 mg Oral BID   hydrALAZINE   50 mg Oral Q8H   pantoprazole   40 mg Oral Daily   rosuvastatin   10 mg Oral Daily   sucralfate   1 g Oral TID WC & HS   acetaminophen , metoprolol  tartrate, ondansetron , polyethylene glycol  Assessment/ Plan:  [name] is a 83 y.o.  female with past medical conditions including hypertension, dementia, atrial fibrillation, congestive heart failure, hyperlipidemia, rheumatoid arthritis, glaucoma and chronic kidney disease stage IV. Patient presents to ED with abnormal labs and has been admitted for Peripheral edema [R60.0] Uremia [N19] AKI (acute kidney injury) ([facility]) [N17.9]   Acute Kidney Injury on chronic kidney disease stage IV with baseline creatinine 1.99 on 02/27/24.  Acute kidney injury secondary to  Chronic kidney disease is secondary to Hypertension Renal ultrasound shows mild obstruction. Renal function is 3.24 on admission.   Creatinine has responded well to IVF. Creatinine 2.29. Can continue IVF but will defer discharge to primary team. Will schedule outpatient follow up with our office. Continue to hold Lisinopril-hydrochlorothiazide  at discharge.   Lab Results  Component Value Date   CREATININE 2.29 (H) 06/20/2024   CREATININE 2.90 (H) 06/19/2024   CREATININE 2.96 (H) 06/18/2024  Intake/Output Summary (Last 24 hours) at 06/20/2024 1058 Last data filed at 06/20/2024 0900 Gross per 24 hour  Intake 190 ml  Output --  Net 190 ml    2. Anemia of chronic kidney disease Lab Results  Component Value Date   HGB 7.7 (L) 06/19/2024    Hgb remains decreased. Will continue to monitor   3. Secondary Hyperparathyroidism: with outpatient labs: None available  Lab Results  Component Value Date   CALCIUM  9.3 06/20/2024   PHOS 2.4 (L) 06/20/2024    Phos decreased, likely secondary to poor oral intake. Appetite has improved.   4.  Hypertension with chronic kidney injury. Home regimen includes doxazosin , hydralazine , lisinopril-hydrochlorothiazide . Holding lisinopril-hydrochlorothiazide  due to kidney injury   LOS: 1 Nikayla Madaris 9/11/202510:58 AM

## 2024-06-20 NOTE — Discharge Summary (Signed)
 Physician Discharge Summary   Patient: [name] MRN: [id number] DOB: 1941/06/15  Admit date:     06/18/2024  Discharge date: 06/20/24  Discharge Physician: Deliliah Room   PCP: Avelina Greig BRAVO, MD   Recommendations at discharge:    Follow up with your PCP in one weel Follow up with nephrology. Call to make an appointment.  Discharge Diagnoses: Principal Problem:   AKI (acute kidney injury) (HCC) Active Problems:   HYPERLIPIDEMIA   Anemia of chronic disease   Aortic valve insufficiency   Mitral regurgitation   History of rheumatoid arthritis   Essential hypertension   Persistent atrial fibrillation (HCC)   Moderate dementia (HCC)   GERD (gastroesophageal reflux disease)   Acute on chronic diastolic CHF (congestive heart failure) (HCC)   Acute kidney injury superimposed on chronic kidney disease (HCC)   Thrombocytopenia [facility])   Hospital Course:  83 y.o. female with past medical conditions including hypertension, dementia, atrial fibrillation, congestive heart failure, hyperlipidemia, rheumatoid arthritis, glaucoma and chronic kidney disease stage IV. Patient presented to ED with abnormal labs/worsening creatinine. Nephrologist, Dr Marcelino consulted. She was started on IVF and Lisinopril/hydrochlorothiazide  were held. Creatinine improved to 2.29 on the day of the discharge. Spoke with patient's husband, daughter and grand daughter at the bedside on the discharge day. She will follow up with nephrology as an outpatient.      Consultants: Nephrology Procedures performed: None  Disposition: Home Diet recommendation:  Renal diet DISCHARGE MEDICATION: Allergies as of 06/20/2024       Reactions   Amlodipine     Ankle swelling   Celecoxib Other (See Comments), Rash   Tachycardia/palpitations Other reaction(s): Other Tachycardia/palpitations Tachycardia/palpitations   Tramadol  Nausea Only        Medication List     STOP taking these medications     lisinopril-hydrochlorothiazide  10-12.5 MG tablet Commonly known as: ZESTORETIC       TAKE these medications    acetaminophen  325 MG tablet Commonly known as: TYLENOL  Take 1 tablet (325 mg total) by mouth every 6 (six) hours as needed for mild pain (pain score 1-3).   amiodarone  200 MG tablet Commonly known as: PACERONE  TAKE 1/2 TABLET (100 MG TOTAL) BY MOUTH ON MONDAY, TUESDAY, THURSDAY, FRIDAY ANDSATURDAY   doxazosin  2 MG tablet Commonly known as: CARDURA  TAKE 1 TABLET BY MOUTH DAILY   Eliquis  2.5 MG Tabs tablet Generic drug: apixaban  TAKE 1 TABLET BY MOUTH TWICE A DAY   gabapentin  100 MG capsule Commonly known as: NEURONTIN  Take 100 mg by mouth 2 (two) times daily.   hydrALAZINE  50 MG tablet Commonly known as: APRESOLINE  TAKE 1 TABLET BY MOUTH EVERY 8 HOURS.   Lumigan 0.01 % Soln Generic drug: bimatoprost Apply 1 drop to eye at bedtime.   methocarbamol  500 MG tablet Commonly known as: ROBAXIN  Take 1 tablet (500 mg total) by mouth 3 (three) times daily as needed for muscle spasms. Take 500 mg by mouth 3 (three) times daily.   ondansetron  4 MG tablet Commonly known as: Zofran  Take 1-2 tablets (4-8 mg total) by mouth every 8 (eight) hours as needed for nausea or vomiting.   pantoprazole  40 MG tablet Commonly known as: PROTONIX  TAKE 1 TABLET BY MOUTH DAILY   rosuvastatin  10 MG tablet Commonly known as: CRESTOR  TAKE 1 TABLET BY MOUTH DAILY   Simbrinza 1-0.2 % Susp Generic drug: Brinzolamide-Brimonidine Place 1 drop into both eyes daily.   sucralfate  1 g tablet Commonly known as: Carafate  Take 1 tablet (1 g total) by  mouth 4 (four) times daily -  with meals and at bedtime.        Follow-up Information     Avelina Greig BRAVO, MD. Schedule an appointment as soon as possible for a visit in 1 week(s).   Specialty: Family Medicine Contact information: [address] [place] [address] [phone]         Marcelino Gales, MD. Call in 2  week(s).   Specialty: Nephrology Contact information: [address] [place] [address] [phone]                Discharge Exam: Filed Weights   06/18/24 1720  Weight: 59 kg   Constitutional: NAD, calm, comfortable Eyes: PERRL, lids and conjunctivae normal ENMT: Mucous membranes are moist. Posterior pharynx clear of any exudate or lesions.Normal dentition.  Neck: normal, supple, no masses, no thyromegaly Respiratory: clear to auscultation bilaterally, no wheezing, no crackles. Normal respiratory effort. No accessory muscle use.  Cardiovascular: Regular rate and rhythm, no murmurs / rubs / gallops. No extremity edema. 2+ pedal pulses. No carotid bruits.  Abdomen: no tenderness, no masses palpated. No hepatosplenomegaly. Bowel sounds positive.  Musculoskeletal: no clubbing / cyanosis. No joint deformity upper and lower extremities. Good ROM, no contractures. Normal muscle tone.  Skin: no rashes, lesions, ulcers. No induration Neurologic: CN 2-12 grossly intact. Sensation intact, DTR normal. Strength 5/5 x all 4 extremities.  Psychiatric: Normal judgment and insight. Alert and oriented x 3. Normal mood.    Condition at discharge: fair  The results of significant diagnostics from this hospitalization (including imaging, microbiology, ancillary and laboratory) are listed below for reference.   Imaging Studies: CT RENAL STONE STUDY Result Date: 06/19/2024 CLINICAL DATA:  Right-sided hydronephrosis. Concern for kidney stone. EXAM: CT ABDOMEN AND PELVIS WITHOUT CONTRAST TECHNIQUE: Multidetector CT imaging of the abdomen and pelvis was performed following the standard protocol without IV contrast. RADIATION DOSE REDUCTION: This exam was performed according to the departmental dose-optimization program which includes automated exposure control, adjustment of the mA and/or kV according to patient size and/or use of iterative reconstruction technique. COMPARISON:  CT abdomen  pelvis dated 03/22/2022. FINDINGS: Evaluation of this exam is limited in the absence of intravenous contrast. Lower chest: Bibasilar linear atelectasis/scarring. No intra-abdominal free air or free fluid. Hepatobiliary: There is a 6.5 cm cyst in the left lobe of the liver. No biliary dilatation. The gallbladder is unremarkable. Pancreas: Unremarkable. No pancreatic ductal dilatation or surrounding inflammatory changes. Spleen: Normal in size without focal abnormality. Adrenals/Urinary Tract: The adrenal glands unremarkable. There is a 4.5 cm left renal interpolar cyst. 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 The urinary bladder is grossly unremarkable. Stomach/Bowel: There is moderate stool throughout the colon. Mild sigmoid diverticulosis. There is no bowel obstruction or active inflammation. The appendix is normal. Vascular/Lymphatic: Moderate aortoiliac atherosclerotic disease. The IVC is unremarkable. No portal venous gas. There is no adenopathy. Reproductive: Hysterectomy. Other: Mild diffuse subcutaneous edema.  No fluid collection. Musculoskeletal: Degenerative changes of the spine. No acute osseous pathology. IMPRESSION: 1. No acute intra-abdominal or pelvic pathology. No hydronephrosis or nephrolithiasis. 2. Mild sigmoid diverticulosis. No bowel obstruction. Normal appendix. 3.  Aortic Atherosclerosis (ICD10-I70.0). Electronically Signed   By: Vanetta Chou M.D.   On: 06/19/2024 18:03   US  Renal Result Date: 06/19/2024 EXAM: US  Retroperitoneum Complete, Renal. CLINICAL HISTORY: AKI on CKD. TECHNIQUE: Real-time ultrasound of the retroperitoneum (complete) with image documentation. COMPARISON: [name] abdomen 02/17/2023. FINDINGS: RIGHT KIDNEY: The right  kidney measures 7.2 x 3.4 x 4.4 cm with a volume of 55.8 cc. There is mild right hydronephrosis. Increased parenchymal echogenicity compatible with chronic medical renal disease. There are 2 anechoic cysts within the interpolar region of the  right kidney. These measure up to 1.3 cm. LEFT KIDNEY: The left kidney measures 8.6 x 4.7 x 5.0 cm. The volume of the left kidney is 104.8 ml. There is no hydronephrosis. Diffuse increased parenchymal echogenicity. There are 2 anechoic cysts within the left kidney. The largest is within the interpolar region measuring 4.8 cm. BLADDER: The urinary bladder appears normal. IMPRESSION: 1. Mild right hydronephrosis. 2. Increased parenchymal echogenicity in both kidneys, compatible with chronic medical renal disease. 3. Anechoic cysts in both kidneys, largest measuring 4.8 cm in the left kidney. Electronically signed by: Waddell Calk MD 06/19/2024 05:01 AM EDT RP Workstation: [id number]   DG Chest Portable 1 View Result Date: 06/18/2024 EXAM: 1 VIEW XRAY OF THE CHEST 06/18/2024 11:11:58 PM COMPARISON: 02/14/2022. CLINICAL HISTORY: Peripheral edema. FINDINGS: LUNGS AND PLEURA: No focal pulmonary opacity. Scarring in the left mid lung. No pulmonary edema. No pleural effusion. No pneumothorax. HEART AND MEDIASTINUM: Cardiomegaly. Aortic calcification. BONES AND SOFT TISSUES: No acute osseous abnormality. IMPRESSION: 1. No acute cardiopulmonary process. Cardiomegaly. Electronically signed by: Norman Gatlin MD 06/18/2024 11:15 PM EDT RP Workstation: [id number]    Microbiology: Results for orders placed or performed during the hospital encounter of 08/17/21  Resp Panel by RT-PCR (Flu A&B, Covid) Nasopharyngeal Swab     Status: None   Collection Time: 08/17/21  2:55 PM   Specimen: Nasopharyngeal Swab; Nasopharyngeal(NP) swabs in vial transport medium  Result Value Ref Range Status   SARS Coronavirus 2 by RT PCR NEGATIVE NEGATIVE Final    Comment: (NOTE) SARS-CoV-2 target nucleic acids are NOT DETECTED.  The SARS-CoV-2 RNA is generally detectable in upper respiratory specimens during the acute phase of infection. The lowest concentration of SARS-CoV-2 viral copies this assay can detect is 138 copies/mL. A  negative result does not preclude SARS-Cov-2 infection and should not be used as the sole basis for treatment or other patient management decisions. A negative result may occur with  improper specimen collection/handling, submission of specimen other than nasopharyngeal swab, presence of viral mutation(s) within the areas targeted by this assay, and inadequate number of viral copies(<138 copies/mL). A negative result must be combined with clinical observations, patient history, and epidemiological information. The expected result is Negative.  Fact Sheet for Patients:  BloggerCourse.com  Fact Sheet for Healthcare Providers:  SeriousBroker.it  This test is no t yet approved or cleared by the [place]  FDA and  has been authorized for detection and/or diagnosis of SARS-CoV-2 by FDA under an Emergency Use Authorization (EUA). This EUA will remain  in effect (meaning this test can be used) for the duration of the COVID-19 declaration under Section 564(b)(1) of the Act, 21 U.S.C.section 360bbb-3(b)(1), unless the authorization is terminated  or revoked sooner.       Influenza A by PCR NEGATIVE NEGATIVE Final   Influenza B by PCR NEGATIVE NEGATIVE Final    Comment: (NOTE) The Xpert Xpress SARS-CoV-2/FLU/RSV plus assay is intended as an aid in the diagnosis of influenza from Nasopharyngeal swab specimens and should not be used as a sole basis for treatment. Nasal washings and aspirates are unacceptable for Xpert Xpress SARS-CoV-2/FLU/RSV testing.  Fact Sheet for Patients: BloggerCourse.com  Fact Sheet for Healthcare Providers: SeriousBroker.it  This test is not yet approved or cleared by the [place] FDA and has been authorized for detection and/or diagnosis of SARS-CoV-2 by FDA under an Emergency Use Authorization (EUA). This EUA will remain in effect (meaning this test can  be used) for the duration of the COVID-19 declaration under Section 564(b)(1) of the Act, 21 U.S.C. section 360bbb-3(b)(1), unless the authorization is terminated or revoked.  Performed at [facility], [address] [place], [place] [address]     Labs: CBC: Recent Labs  Lab 06/18/24 1049 06/18/24 1723 06/19/24 0459  WBC 4.6 5.0 4.0  NEUTROABS 2.8  --   --   HGB 8.4* 8.3* 7.7*  HCT 25.5* 24.8* 23.4*  MCV 94.1 94.7 94.4  PLT 138.0* 143* 134*   Basic Metabolic Panel: Recent Labs  Lab 06/18/24 1049 06/18/24 1723 06/19/24 0459 06/20/24 0953  NA 139 140 142 140  K 4.0 3.7 3.7 3.8  CL 105 109 108 110  CO2 26 22 24 24   GLUCOSE 88 105* 93 138*  BUN 52* 58* 53* 42*  CREATININE 3.24* 2.96* 2.90* 2.29*  CALCIUM  9.0 9.0 8.8* 9.3  PHOS  --   --   --  2.4*   Liver Function Tests: Recent Labs  Lab 06/18/24 1049 06/18/24 1723 06/20/24 0953  AST 15 19  --   ALT 11 13  --   ALKPHOS 42 45  --   BILITOT 0.4 0.8  --   PROT 6.3 6.9  --   ALBUMIN 3.8 3.6 3.0*   CBG: Recent Labs  Lab 06/18/24 1722  GLUCAP 95    Discharge time spent: 42 minutes.  Signed: Deliliah Room, MD [facility] 06/20/2024

## 2024-06-20 NOTE — Plan of Care (Signed)

## 2024-06-25 ENCOUNTER — Ambulatory Visit: Admitting: Family Medicine

## 2024-06-25 ENCOUNTER — Ambulatory Visit: Payer: Self-pay | Admitting: Family Medicine

## 2024-06-25 VITALS — BP 140/82 | HR 53 | Temp 98.5°F | Ht 66.0 in | Wt 137.5 lb

## 2024-06-25 DIAGNOSIS — N189 Chronic kidney disease, unspecified: Secondary | ICD-10-CM

## 2024-06-25 DIAGNOSIS — R600 Localized edema: Secondary | ICD-10-CM | POA: Diagnosis not present

## 2024-06-25 DIAGNOSIS — N179 Acute kidney failure, unspecified: Secondary | ICD-10-CM

## 2024-06-25 LAB — BASIC METABOLIC PANEL WITH GFR
BUN: 38 mg/dL — ABNORMAL HIGH (ref 6–23)
CO2: 24 meq/L (ref 19–32)
Calcium: 9.5 mg/dL (ref 8.4–10.5)
Chloride: 112 meq/L (ref 96–112)
Creatinine, Ser: 2.17 mg/dL — ABNORMAL HIGH (ref 0.40–1.20)
GFR: 20.78 mL/min — ABNORMAL LOW (ref >60.00)
Glucose, Bld: 90 mg/dL (ref 70–99)
Potassium: 3.6 meq/L (ref 3.5–5.1)
Sodium: 137 meq/L (ref 135–145)

## 2024-06-25 MED ORDER — FUROSEMIDE 20 MG PO TABS: 10.0000 mg | ORAL_TABLET | Freq: Every day | ORAL | 0 refills | Status: AC | PRN

## 2024-06-25 NOTE — Patient Instructions (Signed)
 Elevate feet above heart. Wear compression 15-30 mmHg for swelling.  Can use lasix  1/2 tablet as needed daily for swelling, but limit to avoid dehydration.  Keep appt with nephrology.

## 2024-06-25 NOTE — Assessment & Plan Note (Addendum)
 Recent worsening of kidney function superimposed on chronic kidney disease. Improved with IV fluids and holding lisinopril hydrochlorothiazide . Patient has had worsening of peripheral swelling and slight blood pressure elevation. Will reevaluate with basic metabolic panel. Recommended elevation of feet above heart and start wearing compression hose 15 to 30 mmHg. Provided prescription for Lasix  to use half a tablet daily as needed for severe swelling in a limited fashion to help with peripheral swelling until following up with nephrology next week.  She will follow with  daily weights at home.  Counseled on careful use to avoid dehydration.  Return and ER precautions provided.

## 2024-06-25 NOTE — Progress Notes (Signed)
 Patient ID: [name], female    DOB: 23-Dec-1940, 83 y.o.   MRN: [id number]  This visit was conducted in person.  BP (!) 140/82   Pulse (!) 53   Temp 98.5 F (36.9 C) (Temporal)   Ht 5' 6 (1.676 m)   Wt 137 lb 8 oz (62.4 kg)   SpO2 96%   BMI 22.19 kg/m    CC:  Chief Complaint  Patient presents with   Hospitalization Follow-up   Joint Swelling    Ankles    Subjective:   HPI: [name] is a 83 y.o. female presenting on 06/25/2024 for Hospitalization Follow-up and Joint Swelling (Ankles)   Recent hospitalization from September 9 to September 11 Referred by our office to ED with abnormal labs and worsening creatinine. Nephrologist, Dr Marcelino consulted. She was started on IVF and Lisinopril/hydrochlorothiazide  were held. Creatinine improved to 2.29 on the day of the discharge.  Patient taken off and off lisinopril hydrochlorothiazide .    Leading up to hospitalization she had noted peripheral swelling.  Today off of lisinopril hydrochlorothiazide  blood pressure slightly elevated at 140/82  Swelling was alottle better but now worse over the weekend. BP Readings from Last 3 Encounters:  06/25/24 (!) 140/82  06/20/24 (!) 118/56  06/18/24 104/64    Has appt with Dr. Marcelino  07/01/2024   No SOB, no chest pain.  Wt Readings from Last 3 Encounters:  06/25/24 137 lb 8 oz (62.4 kg)  06/18/24 130 lb (59 kg)  06/18/24 138 lb 4 oz (62.7 kg)     Relevant past medical, surgical, family and social history reviewed and updated as indicated. Interim medical history since our last visit reviewed. Allergies and medications reviewed and updated. Outpatient Medications Prior to Visit  Medication Sig Dispense Refill   acetaminophen  (TYLENOL ) 325 MG tablet Take 1 tablet (325 mg total) by mouth every 6 (six) hours as needed for mild pain (pain score 1-3). 90 tablet 0   amiodarone  (PACERONE ) 200 MG tablet TAKE 1/2 TABLET (100 MG TOTAL) BY MOUTH ON MONDAY, TUESDAY,  THURSDAY, FRIDAY ANDSATURDAY 8 tablet 0   doxazosin  (CARDURA ) 2 MG tablet TAKE 1 TABLET BY MOUTH DAILY 90 tablet 3   ELIQUIS  2.5 MG TABS tablet TAKE 1 TABLET BY MOUTH TWICE A DAY 180 tablet 3   gabapentin  (NEURONTIN ) 100 MG capsule Take 100 mg by mouth 2 (two) times daily.     hydrALAZINE  (APRESOLINE ) 50 MG tablet TAKE 1 TABLET BY MOUTH EVERY 8 HOURS. 270 tablet 3   LUMIGAN 0.01 % SOLN Apply 1 drop to eye at bedtime.     methocarbamol  (ROBAXIN ) 500 MG tablet Take 1 tablet (500 mg total) by mouth 3 (three) times daily as needed for muscle spasms. Take 500 mg by mouth 3 (three) times daily. 30 tablet 0   ondansetron  (ZOFRAN ) 4 MG tablet Take 1-2 tablets (4-8 mg total) by mouth every 8 (eight) hours as needed for nausea or vomiting. 30 tablet 0   pantoprazole  (PROTONIX ) 40 MG tablet TAKE 1 TABLET BY MOUTH DAILY 30 tablet 11   rosuvastatin  (CRESTOR ) 10 MG tablet TAKE 1 TABLET BY MOUTH DAILY 90 tablet 3   SIMBRINZA 1-0.2 % SUSP Place 1 drop into both eyes daily.     sucralfate  (CARAFATE ) 1 g tablet Take 1 tablet (1 g total) by mouth 4 (four) times daily -  with meals and at bedtime. 120 tablet 3   No facility-administered medications prior to visit.  Per HPI unless specifically indicated in ROS section below Review of Systems  Constitutional:  Negative for fatigue and fever.  HENT:  Negative for congestion.   Eyes:  Negative for pain.  Respiratory:  Negative for cough and shortness of breath.   Cardiovascular:  Positive for leg swelling. Negative for chest pain and palpitations.  Gastrointestinal:  Negative for abdominal pain.  Genitourinary:  Negative for dysuria and vaginal bleeding.  Musculoskeletal:  Negative for back pain.  Neurological:  Negative for syncope, light-headedness and headaches.  Psychiatric/Behavioral:  Negative for dysphoric mood.    Objective:  BP (!) 140/82   Pulse (!) 53   Temp 98.5 F (36.9 C) (Temporal)   Ht 5' 6 (1.676 m)   Wt 137 lb 8 oz (62.4 kg)   SpO2  96%   BMI 22.19 kg/m   Wt Readings from Last 3 Encounters:  06/25/24 137 lb 8 oz (62.4 kg)  06/18/24 130 lb (59 kg)  06/18/24 138 lb 4 oz (62.7 kg)      Physical Exam Constitutional:      General: She is not in acute distress.    Appearance: Normal appearance. She is well-developed. She is not ill-appearing or toxic-appearing.  HENT:     Head: Normocephalic.     Right Ear: Hearing, tympanic membrane, ear canal and external ear normal. Tympanic membrane is not erythematous, retracted or bulging.     Left Ear: Hearing, tympanic membrane, ear canal and external ear normal. Tympanic membrane is not erythematous, retracted or bulging.     Nose: No mucosal edema or rhinorrhea.     Right Sinus: No maxillary sinus tenderness or frontal sinus tenderness.     Left Sinus: No maxillary sinus tenderness or frontal sinus tenderness.     Mouth/Throat:     Pharynx: Uvula midline.  Eyes:     General: Lids are normal. Lids are everted, no foreign bodies appreciated.     Conjunctiva/sclera: Conjunctivae normal.     Pupils: Pupils are equal, round, and reactive to light.  Neck:     Thyroid : No thyroid  mass or thyromegaly.     Vascular: No carotid bruit.     Trachea: Trachea normal.  Cardiovascular:     Rate and Rhythm: Normal rate and regular rhythm.     Pulses: Normal pulses.     Heart sounds: Normal heart sounds, S1 normal and S2 normal. No murmur heard.    No friction rub. No gallop.  Pulmonary:     Effort: Pulmonary effort is normal. No tachypnea or respiratory distress.     Breath sounds: Normal breath sounds. No decreased breath sounds, wheezing, rhonchi or rales.  Abdominal:     General: Bowel sounds are normal.     Palpations: Abdomen is soft.     Tenderness: There is no abdominal tenderness.  Musculoskeletal:     Cervical back: Normal range of motion and neck supple.     Right lower leg: 2+ Edema present.     Left lower leg: 2+ Edema present.  Skin:    General: Skin is warm and  dry.     Findings: No rash.  Neurological:     Mental Status: She is alert.  Psychiatric:        Mood and Affect: Mood is not anxious or depressed.        Speech: Speech normal.        Behavior: Behavior normal. Behavior is cooperative.        Thought Content: Thought  content normal.        Judgment: Judgment normal.       Results for orders placed or performed during the hospital encounter of 06/18/24  CBG monitoring, ED   Collection Time: 06/18/24  5:22 PM  Result Value Ref Range   Glucose-Capillary 95 70 - 99 mg/dL  Comprehensive metabolic panel   Collection Time: 06/18/24  5:23 PM  Result Value Ref Range   Sodium 140 135 - 145 mmol/L   Potassium 3.7 3.5 - 5.1 mmol/L   Chloride 109 98 - 111 mmol/L   CO2 22 22 - 32 mmol/L   Glucose, Bld 105 (H) 70 - 99 mg/dL   BUN 58 (H) 8 - 23 mg/dL   Creatinine, Ser 7.03 (H) 0.44 - 1.00 mg/dL   Calcium  9.0 8.9 - 10.3 mg/dL   Total Protein 6.9 6.5 - 8.1 g/dL   Albumin 3.6 3.5 - 5.0 g/dL   AST 19 15 - 41 U/L   ALT 13 0 - 44 U/L   Alkaline Phosphatase 45 38 - 126 U/L   Total Bilirubin 0.8 0.0 - 1.2 mg/dL   GFR, Estimated 15 (L) >60 mL/min   Anion gap 9 5 - 15  CBC   Collection Time: 06/18/24  5:23 PM  Result Value Ref Range   WBC 5.0 4.0 - 10.5 K/uL   RBC 2.62 (L) 3.87 - 5.11 MIL/uL   Hemoglobin 8.3 (L) 12.0 - 15.0 g/dL   HCT 75.1 (L) 63.9 - 53.9 %   MCV 94.7 80.0 - 100.0 fL   MCH 31.7 26.0 - 34.0 pg   MCHC 33.5 30.0 - 36.0 g/dL   RDW 85.9 88.4 - 84.4 %   Platelets 143 (L) 150 - 400 K/uL   nRBC 0.0 0.0 - 0.2 %  Protime-INR - (order if patient is taking Coumadin / Warfarin)   Collection Time: 06/18/24  5:23 PM  Result Value Ref Range   Prothrombin Time 14.3 11.4 - 15.2 seconds   INR 1.1 0.8 - 1.2  Folate   Collection Time: 06/18/24  5:23 PM  Result Value Ref Range   Folate >20.0 >5.9 ng/mL  Iron and TIBC   Collection Time: 06/18/24  5:23 PM  Result Value Ref Range   Iron 49 28 - 170 ug/dL   TIBC 751 (L) 749 - 549 ug/dL    Saturation Ratios 20 10.4 - 31.8 %   UIBC 199 ug/dL  Ferritin   Collection Time: 06/18/24  5:23 PM  Result Value Ref Range   Ferritin 199 11 - 307 ng/mL  Reticulocytes   Collection Time: 06/18/24  5:23 PM  Result Value Ref Range   Retic Ct Pct 1.5 0.4 - 3.1 %   RBC. 2.66 (L) 3.87 - 5.11 MIL/uL   Retic Count, Absolute 38.8 19.0 - 186.0 K/uL   Immature Retic Fract 12.5 2.3 - 15.9 %  Urinalysis, Routine w reflex microscopic -Urine, Clean Catch   Collection Time: 06/18/24 11:28 PM  Result Value Ref Range   Color, Urine YELLOW (A) YELLOW   APPearance CLEAR (A) CLEAR   Specific Gravity, Urine 1.011 1.005 - 1.030   pH 5.0 5.0 - 8.0   Glucose, UA NEGATIVE NEGATIVE mg/dL   Hgb urine dipstick NEGATIVE NEGATIVE   Bilirubin Urine NEGATIVE NEGATIVE   Ketones, ur NEGATIVE NEGATIVE mg/dL   Protein, ur NEGATIVE NEGATIVE mg/dL   Nitrite NEGATIVE NEGATIVE   Leukocytes,Ua TRACE (A) NEGATIVE   RBC / HPF 0-5 0 - 5 RBC/hpf  WBC, UA 6-10 0 - 5 WBC/hpf   Bacteria, UA RARE (A) NONE SEEN   Squamous Epithelial / HPF 0 0 - 5 /HPF  Basic metabolic panel   Collection Time: 06/19/24  4:59 AM  Result Value Ref Range   Sodium 142 135 - 145 mmol/L   Potassium 3.7 3.5 - 5.1 mmol/L   Chloride 108 98 - 111 mmol/L   CO2 24 22 - 32 mmol/L   Glucose, Bld 93 70 - 99 mg/dL   BUN 53 (H) 8 - 23 mg/dL   Creatinine, Ser 7.09 (H) 0.44 - 1.00 mg/dL   Calcium  8.8 (L) 8.9 - 10.3 mg/dL   GFR, Estimated 16 (L) >60 mL/min   Anion gap 10 5 - 15  CBC   Collection Time: 06/19/24  4:59 AM  Result Value Ref Range   WBC 4.0 4.0 - 10.5 K/uL   RBC 2.48 (L) 3.87 - 5.11 MIL/uL   Hemoglobin 7.7 (L) 12.0 - 15.0 g/dL   HCT 76.5 (L) 63.9 - 53.9 %   MCV 94.4 80.0 - 100.0 fL   MCH 31.0 26.0 - 34.0 pg   MCHC 32.9 30.0 - 36.0 g/dL   RDW 85.7 88.4 - 84.4 %   Platelets 134 (L) 150 - 400 K/uL   nRBC 0.0 0.0 - 0.2 %  Vitamin B12   Collection Time: 06/19/24  4:59 AM  Result Value Ref Range   Vitamin B-12 411 180 - 914 pg/mL   Renal function panel   Collection Time: 06/20/24  9:53 AM  Result Value Ref Range   Sodium 140 135 - 145 mmol/L   Potassium 3.8 3.5 - 5.1 mmol/L   Chloride 110 98 - 111 mmol/L   CO2 24 22 - 32 mmol/L   Glucose, Bld 138 (H) 70 - 99 mg/dL   BUN 42 (H) 8 - 23 mg/dL   Creatinine, Ser 7.70 (H) 0.44 - 1.00 mg/dL   Calcium  9.3 8.9 - 10.3 mg/dL   Phosphorus 2.4 (L) 2.5 - 4.6 mg/dL   Albumin 3.0 (L) 3.5 - 5.0 g/dL   GFR, Estimated 21 (L) >60 mL/min   Anion gap 6 5 - 15    Assessment and Plan  Peripheral edema -     Basic metabolic panel with GFR  Acute kidney injury superimposed on chronic kidney disease (HCC) Assessment & Plan: Recent worsening of kidney function superimposed on chronic kidney disease. Improved with IV fluids and holding lisinopril hydrochlorothiazide . Patient has had worsening of peripheral swelling and slight blood pressure elevation. Will reevaluate with basic metabolic panel. Recommended elevation of feet above heart and start wearing compression hose 15 to 30 mmHg. Provided prescription for Lasix  to use half a tablet daily as needed for severe swelling in a limited fashion to help with peripheral swelling until following up with nephrology next week.  She will follow with  daily weights at home.  Counseled on careful use to avoid dehydration.  Return and ER precautions provided.   Other orders -     Furosemide ; Take 0.5-1 tablets (10-20 mg total) by mouth daily as needed.  Dispense: 15 tablet; Refill: 0    No follow-ups on file.   Greig Ring, MD

## 2024-06-27 DIAGNOSIS — M48062 Spinal stenosis, lumbar region with neurogenic claudication: Secondary | ICD-10-CM | POA: Diagnosis not present

## 2024-06-27 DIAGNOSIS — M5416 Radiculopathy, lumbar region: Secondary | ICD-10-CM | POA: Diagnosis not present

## 2024-07-01 ENCOUNTER — Other Ambulatory Visit (HOSPITAL_BASED_OUTPATIENT_CLINIC_OR_DEPARTMENT_OTHER): Payer: Self-pay | Admitting: Cardiovascular Disease

## 2024-07-01 DIAGNOSIS — N184 Chronic kidney disease, stage 4 (severe): Secondary | ICD-10-CM | POA: Diagnosis not present

## 2024-07-01 DIAGNOSIS — N2581 Secondary hyperparathyroidism of renal origin: Secondary | ICD-10-CM | POA: Diagnosis not present

## 2024-07-01 DIAGNOSIS — D631 Anemia in chronic kidney disease: Secondary | ICD-10-CM | POA: Diagnosis not present

## 2024-07-01 DIAGNOSIS — N179 Acute kidney failure, unspecified: Secondary | ICD-10-CM | POA: Diagnosis not present

## 2024-07-02 ENCOUNTER — Other Ambulatory Visit: Payer: Self-pay | Admitting: Family Medicine

## 2024-07-02 ENCOUNTER — Other Ambulatory Visit (HOSPITAL_BASED_OUTPATIENT_CLINIC_OR_DEPARTMENT_OTHER): Payer: Self-pay | Admitting: Cardiovascular Disease

## 2024-07-02 NOTE — Telephone Encounter (Signed)
 I will refill these medications but I do recommend yearly follow-up with cardiology.  Husband needs to call to schedule.

## 2024-07-02 NOTE — Telephone Encounter (Signed)
 Cardiology denied request yesterday for these medications.  Okay for us  to refill?

## 2024-07-03 ENCOUNTER — Other Ambulatory Visit: Payer: Self-pay | Admitting: Cardiology

## 2024-07-03 NOTE — Telephone Encounter (Signed)
 Ileene (daughter) notified as instructed by telephone.  States understanding.

## 2024-07-05 ENCOUNTER — Encounter

## 2024-07-05 ENCOUNTER — Other Ambulatory Visit: Payer: Self-pay | Admitting: Family Medicine

## 2024-07-09 ENCOUNTER — Ambulatory Visit: Admitting: Family Medicine

## 2024-07-09 ENCOUNTER — Encounter: Payer: Self-pay | Admitting: Family Medicine

## 2024-07-09 VITALS — BP 140/88 | HR 48 | Temp 98.6°F | Ht 62.5 in | Wt 140.5 lb

## 2024-07-09 DIAGNOSIS — I10 Essential (primary) hypertension: Secondary | ICD-10-CM

## 2024-07-09 DIAGNOSIS — N1832 Chronic kidney disease, stage 3b: Secondary | ICD-10-CM

## 2024-07-09 DIAGNOSIS — G301 Alzheimer's disease with late onset: Secondary | ICD-10-CM | POA: Diagnosis not present

## 2024-07-09 DIAGNOSIS — I130 Hypertensive heart and chronic kidney disease with heart failure and stage 1 through stage 4 chronic kidney disease, or unspecified chronic kidney disease: Secondary | ICD-10-CM | POA: Diagnosis not present

## 2024-07-09 DIAGNOSIS — E559 Vitamin D deficiency, unspecified: Secondary | ICD-10-CM | POA: Diagnosis not present

## 2024-07-09 DIAGNOSIS — N184 Chronic kidney disease, stage 4 (severe): Secondary | ICD-10-CM

## 2024-07-09 DIAGNOSIS — D631 Anemia in chronic kidney disease: Secondary | ICD-10-CM | POA: Diagnosis not present

## 2024-07-09 DIAGNOSIS — F02B0 Dementia in other diseases classified elsewhere, moderate, without behavioral disturbance, psychotic disturbance, mood disturbance, and anxiety: Secondary | ICD-10-CM

## 2024-07-09 DIAGNOSIS — E782 Mixed hyperlipidemia: Secondary | ICD-10-CM

## 2024-07-09 DIAGNOSIS — I480 Paroxysmal atrial fibrillation: Secondary | ICD-10-CM

## 2024-07-09 DIAGNOSIS — Z23 Encounter for immunization: Secondary | ICD-10-CM | POA: Diagnosis not present

## 2024-07-09 DIAGNOSIS — M8580 Other specified disorders of bone density and structure, unspecified site: Secondary | ICD-10-CM | POA: Diagnosis not present

## 2024-07-09 DIAGNOSIS — Z0000 Encounter for general adult medical examination without abnormal findings: Secondary | ICD-10-CM

## 2024-07-09 DIAGNOSIS — I4819 Other persistent atrial fibrillation: Secondary | ICD-10-CM

## 2024-07-09 DIAGNOSIS — D638 Anemia in other chronic diseases classified elsewhere: Secondary | ICD-10-CM

## 2024-07-09 DIAGNOSIS — I5032 Chronic diastolic (congestive) heart failure: Secondary | ICD-10-CM

## 2024-07-09 NOTE — Assessment & Plan Note (Signed)
 Chronic, no longer taking Aricept  as it caused decreased appetite.  When she is feeling better we can consider a trial of Namenda.

## 2024-07-09 NOTE — Assessment & Plan Note (Addendum)
 Rate controlled on amiodarone , cardura  and followed by  Cardiology  On Eliquis  for anticoag.

## 2024-07-09 NOTE — Assessment & Plan Note (Signed)
  Due for re-eval    Rosuvastatin   10 mg daily

## 2024-07-09 NOTE — Assessment & Plan Note (Signed)
 Chronic, stable.  Followed by renal.

## 2024-07-09 NOTE — Assessment & Plan Note (Signed)
 Chronic Tolerable control with   hydralazine  q 8 hours

## 2024-07-09 NOTE — Assessment & Plan Note (Addendum)
 Chronic, followed  hematology and renal.  Plan to make an appt with heme.for consideration of erythropoietin stimulating agents.

## 2024-07-09 NOTE — Assessment & Plan Note (Addendum)
 Per renal.

## 2024-07-09 NOTE — Assessment & Plan Note (Signed)
 Chronic, peripheral swelling improved with Lasix  20 mg half a tablet as needed. Delicate balance with chronic renal failure.

## 2024-07-09 NOTE — Progress Notes (Signed)
 Patient ID: [name], female    DOB: 28-Apr-1941, 83 y.o.   MRN: [id number]  This visit was conducted in person.  BP (!) 140/88   Pulse (!) 48   Temp 98.6 F (37 C) (Oral)   Ht 5' 2.5 (1.588 m)   Wt 140 lb 8 oz (63.7 kg)   SpO2 99%   BMI 25.29 kg/m    CC:  Chief Complaint  Patient presents with   Annual Exam    [facility] Scheduled 07/11/24    Subjective:   HPI: [name] is a 83 y.o. female presenting on 07/09/2024 for Annual Exam ([facility] Scheduled 07/11/24)  The patient presents for annual and review of chronic health problems. He/She also has the following acute concerns today: still with swelling in legs  The patient will see  a LPN or RN for medicare wellness visit.07/11/2024  Recent hospitalization from September 9 to September 11 Referred by our office to ED with abnormal labs and worsening creatinine. Nephrologist, Dr Marcelino consulted. She was started on IVF and Lisinopril/hydrochlorothiazide  were held. Creatinine improved to 2.29 on the day of the discharge. Renal ultrasound showed a questionable mild right-sided hydronephrosis which prompted a CT scan of the abdomen pelvis. CT scan of the abdomen pelvis was negative for hydronephrosis.  Patient taken off and off lisinopril hydrochlorothiazide .   Given recurrence of swelling.. prescription given for furosemide  20 mg 1/2 tab daily prn.   Reviewed recent office visit note with Dr. Marcelino 07/01/2024  Following vit D deficiency, anemia  High cholesterol. Lab Results  Component Value Date   CHOL 205 (H) 08/18/2021   HDL 78 08/18/2021   LDLCALC 118 (H) 08/18/2021   LDLDIRECT 104.4 11/26/2012   TRIG 47 08/18/2021   CHOLHDL 2.6 08/18/2021   Hypertension:   Tolerable control.  BP Readings from Last 3 Encounters:  07/09/24 (!) 140/88  06/25/24 (!) 140/82  06/20/24 (!) 118/56  Using medication without problems or lightheadedness:  Chest pain with exertion: Edema: still with swelling. Wearing compression  hose, trying to elevate legs. Short of breath: Average home BPs: Other issues: AFib, diastolic CHF   Has upcoming appt with Dr. Jama Cardiology for additional recommendation 07/16/2024    Relevant past medical, surgical, family and social history reviewed and updated as indicated. Interim medical history since our last visit reviewed. Allergies and medications reviewed and updated. Outpatient Medications Prior to Visit  Medication Sig Dispense Refill   acetaminophen  (TYLENOL ) 325 MG tablet Take 1 tablet (325 mg total) by mouth every 6 (six) hours as needed for mild pain (pain score 1-3). 90 tablet 0   amiodarone  (PACERONE ) 200 MG tablet TAKE 1/2 TABLET (100 MG TOTAL) BY MOUTH ON MONDAY, TUESDAY, THURSDAY, FRIDAY ANDSATURDAY 8 tablet 0   doxazosin  (CARDURA ) 2 MG tablet TAKE 1 TABLET BY MOUTH DAILY 90 tablet 3   ELIQUIS  2.5 MG TABS tablet TAKE 1 TABLET BY MOUTH TWICE A DAY 180 tablet 3   furosemide  (LASIX ) 20 MG tablet Take 0.5-1 tablets (10-20 mg total) by mouth daily as needed. 15 tablet 0   gabapentin  (NEURONTIN ) 100 MG capsule Take 100 mg by mouth 2 (two) times daily.     hydrALAZINE  (APRESOLINE ) 50 MG tablet TAKE 1 TABLET BY MOUTH EVERY 8 HOURS. 270 tablet 3   LUMIGAN 0.01 % SOLN Apply 1 drop to eye at bedtime.     methocarbamol  (ROBAXIN ) 500 MG tablet Take 1 tablet (500 mg total) by mouth 3 (three) times daily as needed  for muscle spasms. Take 500 mg by mouth 3 (three) times daily. 30 tablet 0   ondansetron  (ZOFRAN ) 4 MG tablet Take 1-2 tablets (4-8 mg total) by mouth every 8 (eight) hours as needed for nausea or vomiting. 30 tablet 0   pantoprazole  (PROTONIX ) 40 MG tablet TAKE 1 TABLET BY MOUTH DAILY 30 tablet 11   rosuvastatin  (CRESTOR ) 10 MG tablet TAKE 1 TABLET BY MOUTH DAILY 90 tablet 3   SIMBRINZA 1-0.2 % SUSP Place 1 drop into both eyes daily.     sucralfate  (CARAFATE ) 1 g tablet Take 1 tablet (1 g total) by mouth 4 (four) times daily -  with meals and at bedtime. 120 tablet 3    No facility-administered medications prior to visit.     Per HPI unless specifically indicated in ROS section below Review of Systems  Constitutional:  Negative for fatigue and fever.  HENT:  Negative for congestion.   Eyes:  Negative for pain.  Respiratory:  Negative for cough and shortness of breath.   Cardiovascular:  Positive for leg swelling. Negative for chest pain and palpitations.  Gastrointestinal:  Negative for abdominal pain.  Genitourinary:  Negative for dysuria and vaginal bleeding.  Musculoskeletal:  Negative for back pain.  Neurological:  Negative for syncope, light-headedness and headaches.  Psychiatric/Behavioral:  Negative for dysphoric mood.    Objective:  BP (!) 140/88   Pulse (!) 48   Temp 98.6 F (37 C) (Oral)   Ht 5' 2.5 (1.588 m)   Wt 140 lb 8 oz (63.7 kg)   SpO2 99%   BMI 25.29 kg/m   Wt Readings from Last 3 Encounters:  07/09/24 140 lb 8 oz (63.7 kg)  06/25/24 137 lb 8 oz (62.4 kg)  06/18/24 130 lb (59 kg)      Physical Exam Constitutional:      General: She is not in acute distress.    Appearance: Normal appearance. She is well-developed. She is not ill-appearing or toxic-appearing.  HENT:     Head: Normocephalic.     Right Ear: Hearing, tympanic membrane, ear canal and external ear normal. Tympanic membrane is not erythematous, retracted or bulging.     Left Ear: Hearing, tympanic membrane, ear canal and external ear normal. Tympanic membrane is not erythematous, retracted or bulging.     Nose: No mucosal edema or rhinorrhea.     Right Sinus: No maxillary sinus tenderness or frontal sinus tenderness.     Left Sinus: No maxillary sinus tenderness or frontal sinus tenderness.     Mouth/Throat:     Pharynx: Uvula midline.  Eyes:     General: Lids are normal. Lids are everted, no foreign bodies appreciated.     Conjunctiva/sclera: Conjunctivae normal.     Pupils: Pupils are equal, round, and reactive to light.  Neck:     Thyroid : No  thyroid  mass or thyromegaly.     Vascular: No carotid bruit.     Trachea: Trachea normal.  Cardiovascular:     Rate and Rhythm: Normal rate and regular rhythm.     Pulses: Normal pulses.     Heart sounds: Normal heart sounds, S1 normal and S2 normal. No murmur heard.    No friction rub. No gallop.  Pulmonary:     Effort: Pulmonary effort is normal. No tachypnea or respiratory distress.     Breath sounds: Normal breath sounds. No decreased breath sounds, wheezing, rhonchi or rales.  Abdominal:     General: Bowel sounds are normal.  Palpations: Abdomen is soft.     Tenderness: There is no abdominal tenderness.  Musculoskeletal:     Cervical back: Normal range of motion and neck supple.     Right lower leg: 2+ Edema present.     Left lower leg: 2+ Edema present.  Skin:    General: Skin is warm and dry.     Findings: No rash.  Neurological:     Mental Status: She is alert.  Psychiatric:        Mood and Affect: Mood is not anxious or depressed.        Speech: Speech normal.        Behavior: Behavior normal. Behavior is cooperative.        Thought Content: Thought content normal.        Judgment: Judgment normal.       Results for orders placed or performed in visit on 06/25/24  Basic Metabolic Panel   Collection Time: 06/25/24 12:20 PM  Result Value Ref Range   Sodium 137 135 - 145 mEq/L   Potassium 3.6 3.5 - 5.1 mEq/L   Chloride 112 96 - 112 mEq/L   CO2 24 19 - 32 mEq/L   Glucose, Bld 90 70 - 99 mg/dL   BUN 38 (H) 6 - 23 mg/dL   Creatinine, Ser 7.82 (H) 0.40 - 1.20 mg/dL   GFR 79.21 (L) >39.99 mL/min   Calcium  9.5 8.4 - 10.5 mg/dL    Assessment and Plan The patient's preventative maintenance and recommended screening tests for an annual wellness exam were reviewed in full today. Brought up to date unless services declined.  Counselled on the importance of diet, exercise, and its role in overall health and mortality. The patient's FH and SH was reviewed, including  their home life, tobacco status, and drug and alcohol status.   Vaccines: Consider Shingrix, COVID, RSV at pharmacy, given high dose flu in office today Pap/DVE: Not indicated Mammo: 2018, due Bone Density: July 2016, due Colon: 02/28/2023  Smoking Status: None ETOH/ drug use: None/none    Routine general medical examination at a health care facility  Need for influenza vaccination -     Flu vaccine HIGH DOSE PF(Fluzone Trivalent)  HYPERLIPIDEMIA Assessment & Plan:  Due for re-eval    Rosuvastatin   10 mg daily   Vitamin D  deficiency Assessment & Plan: Per renal.   Anemia of chronic disease Assessment & Plan: Chronic, stable. Followed by renal   PAF (paroxysmal atrial fibrillation) (HCC)  Persistent atrial fibrillation (HCC) Assessment & Plan: Rate controlled on amiodarone , cardura  and followed by  Cardiology  On Eliquis  for anticoag.   Osteopenia, unspecified location Assessment & Plan: Due for repeat evaluation with bone density.   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  When she is feeling better we can consider a trial of Namenda.   Chronic diastolic CHF (congestive heart failure) (HCC) Assessment & Plan: Chronic, peripheral swelling improved with Lasix  20 mg half a tablet as needed. Delicate balance with chronic renal failure.   Anemia of chronic renal failure, stage 3b (HCC) Assessment & Plan: Chronic, followed  hematology and renal.  Plan to make an appt with heme.for consideration of erythropoietin stimulating agents.    CKD (chronic kidney disease) stage 4, GFR 15-29 ml/min (HCC) Assessment & Plan: Chronic, followed by Dr. Zyair, renal.   Essential hypertension Assessment & Plan:  Chronic Tolerable control with   hydralazine  q 8 hours      Return in  about 6 months (around 01/06/2025) for  follow up in 6 month.   Greig Ring, MD

## 2024-07-09 NOTE — Assessment & Plan Note (Signed)
 Chronic, followed by Dr. Kaileen, renal.

## 2024-07-09 NOTE — Assessment & Plan Note (Deleted)
 Rate controlled and followed by Dr. Raford  On Eliquis  for anticoag.

## 2024-07-09 NOTE — Assessment & Plan Note (Signed)
 Due for repeat evaluation with bone density.

## 2024-07-09 NOTE — Patient Instructions (Addendum)
 Consider Shingrix, COVID, RSV  vaccines at pharmacy,  Talk with family about mammogram and bone density.. call if decode to move forward. Call to schedule follow up with Dr.Rao to consider erythropoietin. Hematology/Oncology Consult note [facility]  [phone] [phone]

## 2024-07-11 ENCOUNTER — Ambulatory Visit (INDEPENDENT_AMBULATORY_CARE_PROVIDER_SITE_OTHER)

## 2024-07-11 VITALS — Ht 62.5 in | Wt 140.0 lb

## 2024-07-11 DIAGNOSIS — Z0000 Encounter for general adult medical examination without abnormal findings: Secondary | ICD-10-CM

## 2024-07-11 DIAGNOSIS — M8580 Other specified disorders of bone density and structure, unspecified site: Secondary | ICD-10-CM

## 2024-07-11 DIAGNOSIS — Z1231 Encounter for screening mammogram for malignant neoplasm of breast: Secondary | ICD-10-CM

## 2024-07-11 NOTE — Patient Instructions (Addendum)
 Ms. Amrhein,  Thank you for taking the time for your Medicare Wellness Visit. I appreciate your continued commitment to your health goals. Please review the care plan we discussed, and feel free to reach out if I can assist you further.  Medicare recommends these wellness visits once per year to help you and your care team stay ahead of potential health issues. These visits are designed to focus on prevention, allowing your provider to concentrate on managing your acute and chronic conditions during your regular appointments.  Please note that Annual Wellness Visits do not include a physical exam. Some assessments may be limited, especially if the visit was conducted virtually. If needed, we may recommend a separate in-person follow-up with your provider.  Ongoing Care Seeing your primary care provider every 3 to 6 months helps us  monitor your health and provide consistent, personalized care.  Referrals If a referral was made during today's visit and you haven't received any updates within two weeks, please contact the referred provider directly to check on the status.  Recommended Screenings:  Health Maintenance  Topic Date Due   Zoster (Shingles) Vaccine (1 of 2) Never done   Breast Cancer Screening  01/20/2019   DEXA scan (bone density measurement)  04/27/2020   COVID-19 Vaccine (7 - Pfizer risk 2024-25 season) 06/10/2024   Medicare Annual Wellness Visit  07/11/2025   Colon Cancer Screening  02/28/2028   DTaP/Tdap/Td vaccine (3 - Tdap) 02/12/2029   Pneumococcal Vaccine for age over 53  Completed   Flu Shot  Completed   HPV Vaccine  Aged Out   Meningitis B Vaccine  Aged Out   Hepatitis C Screening  Discontinued       07/11/2024    1:06 PM  Advanced Directives  Does Patient Have a Medical Advance Directive? No  Would patient like information on creating a medical advance directive? No - Patient declined   Advance Care Planning is important because it: Ensures you receive  medical care that aligns with your values, goals, and preferences. Provides guidance to your family and loved ones, reducing the emotional burden of decision-making during critical moments.  Vision: Annual vision screenings are recommended for early detection of glaucoma, cataracts, and diabetic retinopathy. These exams can also reveal signs of chronic conditions such as diabetes and high blood pressure.  Dental: Annual dental screenings help detect early signs of oral cancer, gum disease, and other conditions linked to overall health, including heart disease and diabetes.

## 2024-07-11 NOTE — Progress Notes (Signed)
 Subjective:   [name] is a 83 y.o. who presents for a Medicare Wellness preventive visit.  As a reminder, Annual Wellness Visits don't include a physical exam, and some assessments may be limited, especially if this visit is performed virtually. We may recommend an in-person follow-up visit with your provider if needed.  Visit Complete: Virtual I connected with  [name] on 07/11/24 by a audio enabled telemedicine application and verified that I am speaking with the correct person using two identifiers.  Patient Location: Home  Provider Location: Office/Clinic  I discussed the limitations of evaluation and management by telemedicine. The patient expressed understanding and agreed to proceed.  Vital Signs: Because this visit was a virtual/telehealth visit, some criteria may be missing or patient reported. Any vitals not documented were not able to be obtained and vitals that have been documented are patient reported.  VideoDeclined- This patient declined Librarian, academic. Therefore the visit was completed with audio only.  Persons Participating in Visit: Patient assisted by Daughter, Avis Budge.  AWV Questionnaire: No: Patient Medicare AWV questionnaire was not completed prior to this visit.  Cardiac Risk Factors include: advanced age (>63men, >59 women);dyslipidemia;hypertension     Objective:    Today's Vitals   07/11/24 1306  Weight: 140 lb (63.5 kg)  Height: 5' 2.5 (1.588 m)  PainSc: 3   PainLoc: Back   Body mass index is 25.2 kg/m.     07/11/2024    1:06 PM 06/20/2024   12:05 AM 06/18/2024    5:21 PM 02/14/2024    2:42 PM 04/20/2023    2:42 PM 02/28/2023   11:06 AM 02/15/2023    9:58 AM  Advanced Directives  Does Patient Have a Medical Advance Directive? No  No No Yes Yes No  Type of Merchandiser, retail of [place];Living will    Copy of Healthcare Power of Attorney in Chart?     No - copy requested     Would patient like information on creating a medical advance directive? No - Patient declined Yes (Inpatient - patient defers creating a medical advance directive at this time - Information given)  No - Patient declined   No - Patient declined    Current Medications (verified) Outpatient Encounter Medications as of 07/11/2024  Medication Sig   acetaminophen  (TYLENOL ) 325 MG tablet Take 1 tablet (325 mg total) by mouth every 6 (six) hours as needed for mild pain (pain score 1-3).   amiodarone  (PACERONE ) 200 MG tablet TAKE 1/2 TABLET (100 MG TOTAL) BY MOUTH ON MONDAY, TUESDAY, THURSDAY, FRIDAY ANDSATURDAY   doxazosin  (CARDURA ) 2 MG tablet TAKE 1 TABLET BY MOUTH DAILY   ELIQUIS  2.5 MG TABS tablet TAKE 1 TABLET BY MOUTH TWICE A DAY   furosemide  (LASIX ) 20 MG tablet Take 0.5-1 tablets (10-20 mg total) by mouth daily as needed.   gabapentin  (NEURONTIN ) 100 MG capsule Take 100 mg by mouth 2 (two) times daily.   hydrALAZINE  (APRESOLINE ) 50 MG tablet TAKE 1 TABLET BY MOUTH EVERY 8 HOURS.   LUMIGAN 0.01 % SOLN Apply 1 drop to eye at bedtime.   methocarbamol  (ROBAXIN ) 500 MG tablet Take 1 tablet (500 mg total) by mouth 3 (three) times daily as needed for muscle spasms. Take 500 mg by mouth 3 (three) times daily.   ondansetron  (ZOFRAN ) 4 MG tablet Take 1-2 tablets (4-8 mg total) by mouth every 8 (eight) hours as needed for nausea or vomiting.  pantoprazole  (PROTONIX ) 40 MG tablet TAKE 1 TABLET BY MOUTH DAILY   rosuvastatin  (CRESTOR ) 10 MG tablet TAKE 1 TABLET BY MOUTH DAILY   SIMBRINZA 1-0.2 % SUSP Place 1 drop into both eyes daily.   sucralfate  (CARAFATE ) 1 g tablet Take 1 tablet (1 g total) by mouth 4 (four) times daily -  with meals and at bedtime.   No facility-administered encounter medications on file as of 07/11/2024.    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 cath 2/09: no CAD, EF 70%   GERD (gastroesophageal reflux disease) 07/26/2021   HYPERLIPIDEMIA    HYPERTENSION    Hypertensive urgency 07/19/2022   Mild Aortic insufficiency    Moderate mitral regurgitation    Moderate tricuspid regurgitation    OSTEOPENIA    PAF (paroxysmal atrial fibrillation) (HCC)    a. s/p ablation 2013; b. CHADS2VASc -> 4 (HTN, age x 2, female)-->Eliquis .   PAT (paroxysmal atrial tachycardia)    a. 04/2019 Zio: Occas PACs and rare PVCs. 21 atrial runs - longest 20 beats, max rate 169.   Pneumonia 2009   RA (rheumatoid arthritis) (HCC)    Sinus Bradycardia    a. asymptomatic but prevents use of AVN blocking agents; b. 04/2019 Zio: Avg HR 61 (37-109).   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 05/2019 Echo: EF 60-65%. DD. RVSP 41.85mmHg. Mod dil LA. Mod MR/TR. Mild AI.   Past Surgical History:  Procedure Laterality Date   ABLATION OF DYSRHYTHMIC FOCUS     CARDIAC CATHETERIZATION     COLONOSCOPY  04/18/08   COLONOSCOPY WITH PROPOFOL  N/A 02/28/2023   Procedure: COLONOSCOPY WITH PROPOFOL ;  Surgeon: Unk Corinn Skiff, MD;  Location: [facility] ENDOSCOPY;  Service: Gastroenterology;  Laterality: N/A;   ESOPHAGOGASTRODUODENOSCOPY (EGD) WITH PROPOFOL  N/A 02/28/2023   Procedure: ESOPHAGOGASTRODUODENOSCOPY (EGD) WITH PROPOFOL ;  Surgeon: Unk Corinn Skiff, MD;  Location: [facility] ENDOSCOPY;  Service: Gastroenterology;  Laterality: N/A;   Partial hysterectomy--1979     Thoracentesis   12/20/07     Family History  Problem Relation Age of Onset   Diabetes Other    Breast cancer Paternal Aunt 19   Heart disease Mother    Pancreatic cancer Father    Atrial fibrillation Brother    Heart disease Brother    Heart attack Brother    Social History   Socioeconomic History   Marital status: Married    Spouse name: Not on file   Number of children: Not on file   Years of  education: Not on file   Highest education level: Not on file  Occupational History   Not on file  Tobacco Use   Smoking status: Never   Smokeless tobacco: Never  Vaping Use   Vaping status: Never Used  Substance and Sexual Activity   Alcohol use: No    Alcohol/week: 0.0 standard drinks of alcohol   Drug use: No   Sexual activity: Not Currently    Birth control/protection: None  Other Topics Concern   Not on file  Social History Narrative   Marital Status: widow x 1 yrChildren: 5, grandchildren 5, numerous great grand 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 Full Code ( reviewed 2015) Occasional exercise.Diet: fruits and veggies, lean meats.   Right handed    Caffeine- none    Social Drivers of Health   Financial Resource Strain: Low Risk  (07/11/2024)   Overall Financial Resource Strain (CARDIA)    Difficulty of Paying Living Expenses: Not hard at all  Food Insecurity: No Food Insecurity (07/11/2024)   Hunger Vital Sign    Worried About Running Out of Food in the Last Year: Never true    Ran Out of Food in the Last Year: Never true  Transportation Needs: No Transportation Needs (07/11/2024)   PRAPARE - Administrator, Civil Service (Medical): No    Lack of Transportation (Non-Medical): No  Physical Activity: Inactive (07/11/2024)   Exercise Vital Sign    Days of Exercise per Week: 0 days    Minutes of Exercise per Session: 0 min  Stress: No Stress Concern Present (07/11/2024)   [employer] of Occupational Health - Occupational Stress Questionnaire    Feeling of Stress: Not at all  Social Connections: Socially Integrated (07/11/2024)   Social Connection and Isolation Panel    Frequency of  Communication with Friends and Family: More than three times a week    Frequency of Social Gatherings with Friends and Family: More than three times a week    Attends Religious Services: More than 4 times per year    Active Member of [employer] or Organizations: Yes    Attends Banker Meetings: Never    Marital Status: Married    Tobacco Counseling Counseling given: Not Answered    Clinical Intake:  Pre-visit preparation completed: Yes  Pain : No/denies pain Pain Score: 3  Pain Location: Back     BMI - recorded: 25.2 Nutritional Status: BMI 25 -29 Overweight Nutritional Risks: None Diabetes: No  Lab Results  Component Value Date   HGBA1C 5.3 03/22/2022   HGBA1C 5.3 08/17/2021   HGBA1C  11/20/2007    5.9 (NOTE)   The ADA recommends the following therapeutic goals for glycemic   control related to Hgb A1C measurement:   Goal of Therapy:   < 7.0% Hgb A1C   Action Suggested:  > 8.0% Hgb A1C   Ref:  Diabetes Care, 22, Suppl. 1, 1999     How often do you need to have someone help you when you read instructions, pamphlets, or other written materials from your doctor or pharmacy?: 3 - Sometimes (Spouse helps)  Interpreter Needed?: No  Information entered by :: Verdie Saba, CMA   Activities of Daily Living     07/11/2024    1:10 PM 06/20/2024   12:05 AM  In your present state of health, do you have any difficulty performing the following activities:  Hearing? 0   Vision? 0   Difficulty concentrating or making decisions? 0   Walking or climbing stairs? 0   Dressing or bathing? 0   Doing errands, shopping? 1 0  Comment spouse helps   Preparing Food and eating ? N   Using the Toilet? N   In the past six months, have you accidently leaked urine? Y   Do you have problems with loss of bowel control? N   Managing your Medications? Y   Comment Spouse helps   Managing your Finances? Y   Comment Spouse/Dtr helps   Housekeeping or managing your Housekeeping? Y    Comment Spouse helps  Patient Care Team: Avelina Greig BRAVO, MD as PCP - General Raford Riggs, MD as PCP - Cardiology (Cardiology) Inocencio Soyla Lunger, MD as PCP - Electrophysiology (Cardiology) Jaye Fallow, MD as Referring Physician (Ophthalmology) Maurie Rayfield BIRCH, RN as Oncology Nurse Navigator Melanee Annah BROCKS, MD as Consulting Physician (Oncology) Pa, [facility] (Optometry)  I have updated your Care Teams any recent Medical Services you may have received from other providers in the past year.     Assessment:   This is a routine wellness examination for [place].  Hearing/Vision screen Hearing Screening - Comments:: Denies hearing difficulties   Vision Screening - Comments:: Wears rx glasses - up to date with routine eye exams with [facility]   Goals Addressed               This Visit's Progress     Patient Stated (pt-stated)        Patient stated that she does walk when back is not hurting       Depression Screen     07/11/2024    1:11 PM 07/09/2024   12:21 PM 06/18/2024   10:04 AM 01/12/2024    9:53 AM 08/16/2023   12:26 PM 05/31/2023   12:13 PM 04/20/2023    2:41 PM  PHQ 2/9 Scores  PHQ - 2 Score 0 0 0 0 0 0 0  PHQ- 9 Score 0  0  0 2 0    Fall Risk     07/11/2024    1:10 PM 07/09/2024   12:21 PM 06/18/2024   10:04 AM 06/04/2024   10:45 AM 01/12/2024    9:53 AM  Fall Risk   Falls in the past year? 0 0 0 0 0  Number falls in past yr: 0 0 0 0 0  Injury with Fall? 0 0 0 0 0  Risk for fall due to : No Fall Risks No Fall Risks No Fall Risks No Fall Risks No Fall Risks  Follow up Falls evaluation completed;Falls prevention discussed Falls evaluation completed Falls evaluation completed;Education provided Falls evaluation completed Falls evaluation completed    MEDICARE RISK AT HOME:  Medicare Risk at Home Any stairs in or around the home?: No If so, are there any without handrails?: No Home free of loose throw rugs in walkways, pet  beds, electrical cords, etc?: Yes Adequate lighting in your home to reduce risk of falls?: Yes Life alert?: No Use of a cane, walker or w/c?: No Grab bars in the bathroom?: No Shower chair or bench in shower?: Yes Elevated toilet seat or a handicapped toilet?: No  TIMED UP AND GO:  Was the test performed?  No  Cognitive Function: Declined/Normal: No cognitive concerns noted by patient or family. Patient alert, oriented, able to answer questions appropriately and recall recent events. No signs of memory loss or confusion.    07/11/2024    1:13 PM 07/16/2018   12:19 PM 07/12/2017    9:38 AM 03/10/2016   11:11 AM  MMSE - Mini Mental State Exam  Not completed: Refused     Orientation to time  5 5  5    Orientation to Place  5 5  5    Registration  3 3  3    Attention/ Calculation  0 0  0   Recall  3 1  3    Recall-comments   unable to recall 2 of 3 words    Language- name 2 objects  0 0  0   Language-  repeat  1 1 1   Language- follow 3 step command  3 3  0   Language- follow 3 step command-comments    pt was unable to follow 3 steps to 3 step command   Language- read & follow direction  0 0  0   Write a sentence  0 0  0   Copy design  0 0  0   Total score  20 18  17       Data saved with a previous flowsheet row definition        04/20/2023    2:46 PM  6CIT Screen  What Year? 4 points  What month? 0 points  What time? 3 points  Count back from 20 4 points  Months in reverse 4 points  Repeat phrase 10 points  Total Score 25 points    Immunizations Immunization History  Administered Date(s) Administered   Fluad Quad(high Dose 65+) 07/17/2019, 11/10/2020, 10/13/2021, 08/04/2022   Fluad Trivalent(High Dose 65+) 09/11/2023   INFLUENZA, HIGH DOSE SEASONAL PF 07/14/2015, 07/09/2024   Influenza Whole 08/05/2009   Influenza,inj,Quad PF,6+ Mos 08/25/2016, 07/14/2017, 07/16/2018   PFIZER(Purple Top)SARS-COV-2 Vaccination 11/04/2019, 11/25/2019, 09/25/2020   PPD Test 05/01/2012,  04/17/2013   Pfizer Covid-19 Vaccine Bivalent Booster 69yrs & up 09/22/2021   Pfizer(Comirnaty)Fall Seasonal Vaccine 12 years and older 08/24/2022, 09/11/2023   Pneumococcal Conjugate-13 04/17/2015   Pneumococcal Polysaccharide-23 10/11/2007   Td 10/11/2003, 02/13/2019    Screening Tests Health Maintenance  Topic Date Due   Zoster Vaccines- Shingrix (1 of 2) Never done   Mammogram  01/20/2019   DEXA SCAN  04/27/2020   COVID-19 Vaccine (7 - Pfizer risk 2024-25 season) 06/10/2024   Medicare Annual Wellness (AWV)  07/11/2025   Colonoscopy  02/28/2028   DTaP/Tdap/Td (3 - Tdap) 02/12/2029   Pneumococcal Vaccine: 50+ Years  Completed   Influenza Vaccine  Completed   HPV VACCINES  Aged Out   Meningococcal B Vaccine  Aged Out   Hepatitis C Screening  Discontinued    Health Maintenance Items Addressed: Mammogram ordered, DEXA ordered  Additional Screening:  Vision Screening: Recommended annual ophthalmology exams for early detection of glaucoma and other disorders of the eye. Is the patient up to date with their annual eye exam?  Yes  Who is the provider or what is the name of the office in which the patient attends annual eye exams? [facility]  Dental Screening: Recommended annual dental exams for proper oral hygiene  Community Resource Referral / Chronic Care Management: CRR required this visit?  No   CCM required this visit?  No   Plan:    I have personally reviewed and noted the following in the patient's chart:   Medical and social history Use of alcohol, tobacco or illicit drugs  Current medications and supplements including opioid prescriptions. Patient is not currently taking opioid prescriptions. Functional ability and status Nutritional status Physical activity Advanced directives List of other physicians Hospitalizations, surgeries, and ER visits in previous 12 months Vitals Screenings to include cognitive, depression, and falls Referrals and  appointments  In addition, I have reviewed and discussed with patient certain preventive protocols, quality metrics, and best practice recommendations. A written personalized care plan for preventive services as well as general preventive health recommendations were provided to patient.   Verdie CHRISTELLA Saba, CMA   07/11/2024   After Visit Summary: (MyChart) Due to this being a telephonic visit, the after visit summary with patients personalized plan was offered to patient via  MyChart   Notes: Nothing significant to report at this time.

## 2024-07-16 DIAGNOSIS — I071 Rheumatic tricuspid insufficiency: Secondary | ICD-10-CM | POA: Diagnosis not present

## 2024-07-16 DIAGNOSIS — I10 Essential (primary) hypertension: Secondary | ICD-10-CM | POA: Diagnosis not present

## 2024-07-16 DIAGNOSIS — I480 Paroxysmal atrial fibrillation: Secondary | ICD-10-CM | POA: Diagnosis not present

## 2024-07-16 DIAGNOSIS — R001 Bradycardia, unspecified: Secondary | ICD-10-CM | POA: Diagnosis not present

## 2024-07-16 DIAGNOSIS — I351 Nonrheumatic aortic (valve) insufficiency: Secondary | ICD-10-CM | POA: Diagnosis not present

## 2024-07-16 DIAGNOSIS — E7849 Other hyperlipidemia: Secondary | ICD-10-CM | POA: Diagnosis not present

## 2024-07-16 DIAGNOSIS — R600 Localized edema: Secondary | ICD-10-CM | POA: Diagnosis not present

## 2024-07-16 DIAGNOSIS — I340 Nonrheumatic mitral (valve) insufficiency: Secondary | ICD-10-CM | POA: Diagnosis not present

## 2024-07-16 DIAGNOSIS — N1832 Chronic kidney disease, stage 3b: Secondary | ICD-10-CM | POA: Diagnosis not present

## 2024-07-17 DIAGNOSIS — I352 Nonrheumatic aortic (valve) stenosis with insufficiency: Secondary | ICD-10-CM | POA: Diagnosis not present

## 2024-07-17 DIAGNOSIS — I361 Nonrheumatic tricuspid (valve) insufficiency: Secondary | ICD-10-CM | POA: Diagnosis not present

## 2024-07-17 DIAGNOSIS — I517 Cardiomegaly: Secondary | ICD-10-CM | POA: Diagnosis not present

## 2024-07-17 DIAGNOSIS — I371 Nonrheumatic pulmonary valve insufficiency: Secondary | ICD-10-CM | POA: Diagnosis not present

## 2024-07-18 ENCOUNTER — Encounter: Payer: Self-pay | Admitting: Nurse Practitioner

## 2024-07-18 ENCOUNTER — Telehealth: Payer: Self-pay | Admitting: Nurse Practitioner

## 2024-07-18 ENCOUNTER — Inpatient Hospital Stay: Attending: Oncology

## 2024-07-18 ENCOUNTER — Inpatient Hospital Stay (HOSPITAL_BASED_OUTPATIENT_CLINIC_OR_DEPARTMENT_OTHER): Admitting: Nurse Practitioner

## 2024-07-18 VITALS — BP 196/77 | HR 57 | Temp 97.1°F | Resp 24 | Ht 62.5 in | Wt 137.3 lb

## 2024-07-18 DIAGNOSIS — D631 Anemia in chronic kidney disease: Secondary | ICD-10-CM | POA: Insufficient documentation

## 2024-07-18 DIAGNOSIS — D509 Iron deficiency anemia, unspecified: Secondary | ICD-10-CM | POA: Diagnosis not present

## 2024-07-18 DIAGNOSIS — N184 Chronic kidney disease, stage 4 (severe): Secondary | ICD-10-CM | POA: Diagnosis not present

## 2024-07-18 DIAGNOSIS — N1832 Chronic kidney disease, stage 3b: Secondary | ICD-10-CM | POA: Diagnosis present

## 2024-07-18 LAB — CBC (CANCER CENTER ONLY)
HCT: 26.4 % — ABNORMAL LOW (ref 36.0–46.0)
Hemoglobin: 8.7 g/dL — ABNORMAL LOW (ref 12.0–15.0)
MCH: 31 pg (ref 26.0–34.0)
MCHC: 33 g/dL (ref 30.0–36.0)
MCV: 94 fL (ref 80.0–100.0)
Platelet Count: 173 K/uL (ref 150–400)
RBC: 2.81 MIL/uL — ABNORMAL LOW (ref 3.87–5.11)
RDW: 14.6 % (ref 11.5–15.5)
WBC Count: 5.2 K/uL (ref 4.0–10.5)
nRBC: 0 % (ref 0.0–0.2)

## 2024-07-18 LAB — IRON AND TIBC
Iron: 52 ug/dL (ref 28–170)
Saturation Ratios: 20 % (ref 10.4–31.8)
TIBC: 266 ug/dL (ref 250–450)
UIBC: 214 ug/dL

## 2024-07-18 LAB — FERRITIN: Ferritin: 199 ng/mL (ref 11–307)

## 2024-07-18 NOTE — Progress Notes (Unsigned)
 EKG 2 days ago with cardiology. Suppose to be starting a new med, not sure of the name.

## 2024-07-18 NOTE — Progress Notes (Unsigned)
 Hematology/Oncology Consult Note [facility]  [phone] [phone]  Patient Care Team: Avelina Greig BRAVO, MD as PCP - General Raford Riggs, MD as PCP - Cardiology (Cardiology) Inocencio Soyla Lunger, MD as PCP - Electrophysiology (Cardiology) Jaye Fallow, MD as Referring Physician (Ophthalmology) Maurie Rayfield BIRCH, RN as Oncology Nurse Navigator Melanee Annah BROCKS, MD as Consulting Physician (Oncology) [place], [facility])   Name of the patient: [name]  [id number]  Mar 04, 1941   Date of visit: 07/18/24  Diagnosis- 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  Her most recent CBC with differential from 11/11/2020 showed a white count of 5.4, H&H of 10.6/32.1 with an MCV of 92 and a platelet count of 183.  Looking back at her prior CBCs patient has always had a normal white count and a platelet count and her hemoglobin has typically remained between 10-11 since 2009.  Patient recently had iron studies, TSH and B12 checked on 11/11/2020 which was within normal limits.     Patient does have chronic kidney disease but has not required any EPO so far    Interval history-patient is an 83 year old female with history of anemia chronic kidney disease who returns to clinic for routine follow-up and consideration of IV iron.  She has not yet required an EPO.  She feels increasingly fatigued.  Symptoms wax and wane.  Denies black or bloody stools.  Denies vaginal bleeding or spotting.  No recent hospitalizations.  ECOG PS- 1 Pain scale- 0  Review of systems- Review of Systems  Constitutional:  Positive for malaise/fatigue. Negative  for chills, fever and weight loss.  HENT:  Negative for congestion, ear discharge and nosebleeds.   Eyes:  Negative for blurred vision.  Respiratory:  Negative for cough, hemoptysis, sputum production, shortness of breath and wheezing.   Cardiovascular:  Negative for chest pain, palpitations, orthopnea and claudication.  Gastrointestinal:  Negative for abdominal pain, blood in stool, constipation, diarrhea, heartburn, melena, nausea and vomiting.  Genitourinary:  Negative for dysuria, flank pain, frequency, hematuria and urgency.  Musculoskeletal:  Negative for back pain, joint pain and myalgias.  Skin:  Negative for rash.  Neurological:  Negative for dizziness, tingling, focal weakness, seizures, weakness and headaches.  Endo/Heme/Allergies:  Does not bruise/bleed easily.  Psychiatric/Behavioral:  Negative for depression and suicidal ideas. The patient does not have insomnia.     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 cath 2/09: no CAD, EF 70%   GERD (gastroesophageal reflux disease) 07/26/2021   HYPERLIPIDEMIA    HYPERTENSION    Hypertensive urgency 07/19/2022   Mild Aortic insufficiency    Moderate mitral regurgitation    Moderate tricuspid regurgitation    OSTEOPENIA    PAF (paroxysmal atrial fibrillation) (HCC)    a. s/p ablation 2013; b. CHADS2VASc -> 4 (HTN, age x 2, female)-->Eliquis .   PAT (paroxysmal  atrial tachycardia)    a. 04/2019 Zio: Occas PACs and rare PVCs. 21 atrial runs - longest 20 beats, max rate 169.   Pneumonia 2009   RA (rheumatoid arthritis) (HCC)    Sinus Bradycardia    a.  asymptomatic but prevents use of AVN blocking agents; b. 04/2019 Zio: Avg HR 61 (37-109).   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 05/2019 Echo: EF 60-65%. DD. RVSP 41.22mmHg. Mod dil LA. Mod MR/TR. Mild AI.   Past Surgical History:  Procedure Laterality Date   ABLATION OF DYSRHYTHMIC FOCUS     CARDIAC CATHETERIZATION     COLONOSCOPY  04/18/08   COLONOSCOPY WITH PROPOFOL  N/A 02/28/2023   Procedure: COLONOSCOPY WITH PROPOFOL ;  Surgeon: Unk Corinn Skiff, MD;  Location: [facility] ENDOSCOPY;  Service: Gastroenterology;  Laterality: N/A;   ESOPHAGOGASTRODUODENOSCOPY (EGD) WITH PROPOFOL  N/A 02/28/2023   Procedure: ESOPHAGOGASTRODUODENOSCOPY (EGD) WITH PROPOFOL ;  Surgeon: Unk Corinn Skiff, MD;  Location: [facility] ENDOSCOPY;  Service: Gastroenterology;  Laterality: N/A;   Partial hysterectomy--1979     Thoracentesis   12/20/07     Social History   Socioeconomic History   Marital status: Married    Spouse name: Not on file   Number of children: Not on file   Years of education: Not on file   Highest education level: Not on file  Occupational History   Not on file  Tobacco Use   Smoking status: Never   Smokeless tobacco: Never  Vaping Use   Vaping status: Never Used  Substance and Sexual Activity   Alcohol use: No    Alcohol/week: 0.0 standard drinks of alcohol   Drug use: No   Sexual activity: Not Currently    Birth control/protection: None  Other Topics Concern   Not on file  Social History Narrative   Marital Status: widow x 1 yrChildren: 5, grandchildren 82, numerous great grand 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 Full Code ( reviewed 2015) Occasional exercise.Diet: fruits and veggies, lean meats.    Right handed    Caffeine- none    Social Drivers of Health   Financial Resource Strain: Low Risk  (07/11/2024)   Overall Financial Resource Strain (CARDIA)    Difficulty of Paying Living Expenses: Not hard at all  Food Insecurity: No Food Insecurity (07/11/2024)   Hunger Vital Sign    Worried About Running Out of Food in the Last Year: Never true    Ran Out of Food in the Last Year: Never true  Transportation Needs: No Transportation Needs (07/11/2024)   PRAPARE - Administrator, Civil Service (Medical): No    Lack of Transportation (Non-Medical): No  Physical Activity: Inactive (07/11/2024)   Exercise Vital Sign    Days of Exercise per Week: 0 days    Minutes of Exercise per Session: 0 min  Stress: No Stress Concern Present (07/11/2024)   [employer] of Occupational Health - Occupational Stress Questionnaire    Feeling of Stress: Not at all  Social Connections: Socially Integrated (07/11/2024)   Social Connection and Isolation Panel    Frequency of Communication with Friends and Family: More than three times a week    Frequency of Social Gatherings with Friends and Family: More than three times a week    Attends Religious Services: More than 4 times per year  Active Member of Clubs or Organizations: Yes    Attends Banker Meetings: Never    Marital Status: Married  Catering manager Violence: Not At Risk (07/11/2024)   Humiliation, Afraid, Rape, and Kick questionnaire    Fear of Current or Ex-Partner: No    Emotionally Abused: No    Physically Abused: No    Sexually Abused: No   Family History  Problem Relation Age of Onset   Diabetes Other    Breast cancer Paternal Aunt 14   Heart disease Mother    Pancreatic cancer Father    Atrial fibrillation Brother    Heart disease Brother    Heart attack Brother     Current Outpatient Medications:    acetaminophen  (TYLENOL ) 325 MG tablet, Take 1 tablet (325 mg total) by mouth every 6 (six) hours as  needed for mild pain (pain score 1-3)., Disp: 90 tablet, Rfl: 0   amiodarone  (PACERONE ) 200 MG tablet, TAKE 1/2 TABLET (100 MG TOTAL) BY MOUTH ON MONDAY, TUESDAY, THURSDAY, FRIDAY ANDSATURDAY, Disp: 8 tablet, Rfl: 0   doxazosin  (CARDURA ) 2 MG tablet, TAKE 1 TABLET BY MOUTH DAILY, Disp: 90 tablet, Rfl: 3   ELIQUIS  2.5 MG TABS tablet, TAKE 1 TABLET BY MOUTH TWICE A DAY, Disp: 180 tablet, Rfl: 3   furosemide  (LASIX ) 20 MG tablet, Take 0.5-1 tablets (10-20 mg total) by mouth daily as needed., Disp: 15 tablet, Rfl: 0   gabapentin  (NEURONTIN ) 100 MG capsule, Take 100 mg by mouth 2 (two) times daily., Disp: , Rfl:    hydrALAZINE  (APRESOLINE ) 50 MG tablet, TAKE 1 TABLET BY MOUTH EVERY 8 HOURS., Disp: 270 tablet, Rfl: 3   LUMIGAN 0.01 % SOLN, Apply 1 drop to eye at bedtime., Disp: , Rfl:    methocarbamol  (ROBAXIN ) 500 MG tablet, Take 1 tablet (500 mg total) by mouth 3 (three) times daily as needed for muscle spasms. Take 500 mg by mouth 3 (three) times daily., Disp: 30 tablet, Rfl: 0   ondansetron  (ZOFRAN ) 4 MG tablet, Take 1-2 tablets (4-8 mg total) by mouth every 8 (eight) hours as needed for nausea or vomiting., Disp: 30 tablet, Rfl: 0   pantoprazole  (PROTONIX ) 40 MG tablet, TAKE 1 TABLET BY MOUTH DAILY, Disp: 30 tablet, Rfl: 11   rosuvastatin  (CRESTOR ) 10 MG tablet, TAKE 1 TABLET BY MOUTH DAILY, Disp: 90 tablet, Rfl: 3   SIMBRINZA 1-0.2 % SUSP, Place 1 drop into both eyes daily., Disp: , Rfl:    sucralfate  (CARAFATE ) 1 g tablet, Take 1 tablet (1 g total) by mouth 4 (four) times daily -  with meals and at bedtime., Disp: 120 tablet, Rfl: 3  Physical exam:  Vitals:   07/18/24 1339  BP: (!) 196/77  Pulse: (!) 57  Resp: (!) 24  Temp: (!) 97.1 F (36.2 C)  TempSrc: Tympanic  SpO2: 100%  Weight: 137 lb 4.8 oz (62.3 kg)  Height: 5' 2.5 (1.588 m)   Physical Exam Vitals reviewed.  Constitutional:      Appearance: She is not ill-appearing.  Cardiovascular:     Rate and Rhythm: Normal rate and  regular rhythm.     Heart sounds: Normal heart sounds.  Pulmonary:     Effort: Pulmonary effort is normal. No respiratory distress.     Breath sounds: No wheezing.  Abdominal:     General: There is no distension.     Tenderness: There is no abdominal tenderness.  Skin:    General: Skin is warm and dry.  Neurological:  General: No focal deficit present.     Mental Status: She is alert and oriented to person, place, and time.  Psychiatric:        Mood and Affect: Mood normal.        Behavior: Behavior normal.     I have personally reviewed labs listed below:    Latest Ref Rng & Units 07/18/2024   12:59 PM  CBC  WBC 4.0 - 10.5 K/uL 5.2   Hemoglobin 12.0 - 15.0 g/dL 8.7   Hematocrit 63.9 - 46.0 % 26.4   Platelets 150 - 400 K/uL 173    Iron/TIBC/Ferritin/ %Sat    Component Value Date/Time   IRON 52 07/18/2024 1259   TIBC 266 07/18/2024 1259   FERRITIN 199 07/18/2024 1259   IRONPCTSAT 20 07/18/2024 1259    Assessment and plan- Patient is a 83 y.o. female here for routine follow-up of anemia of chronic kidney disease  Anemia of CKD stage IV-we again reviewed the pathophysiology of chronic kidney disease and anemia.  Also discussed the role of optimizing her iron stores in the setting of anemia of CKD.  Iron stores are pending at time of visit but ferritin has now resulted and is 199, iron sat 20.  No role for IV iron today.  We discussed that the goal is to keep her ferritin close to 100 and iron sat close to 20%.  Her hemoglobin has been dropping however and is now 8.7.  Previously 9-10 range.  Recommend starting EPO.  Dr. Melanee has entered orders and recommends Retacrit 10,000 units.  Will plan to check her hemoglobin monthly.  Goal to minimize transfusions.  Hold if hemoglobin 11 or greater.  We can reviewed the risks including those for VTE MI and strokes which is especially occurs when targeting higher hemoglobin level has.  She does not have any active malignancy and cancer  patients Retacrit may accelerate tumor growth.  Regular monitoring of her blood pressure is essential particularly as we minimize her risk factors for cardiovascular events.  She verbalizes understanding and is in agreement to proceed. Hypertension-hypertensive in clinic today.  Patient reports feeling quite anxious.  She also had a new medication started by cardiology but has not yet started it.  I have recommended that she monitor her blood pressure at home as she is asymptomatic.  We will touch base with her by phone tomorrow to repeat these readings.  Will need to hold Retacrit if systolic blood pressure is greater than 180 in clinic.   Disposition: Next available lab (H&H), retacrit 1 mo- lab (H&H), +/- retacrit 63mo- lab (H&H), +/- retacrit 3 mo- lab (cbc, cmp, ferritin, iron studies), Dr Melanee, +/- retacrit- la  Visit Diagnosis 1. Anemia of chronic renal failure, stage 4 (severe) (HCC)    Tinnie Dawn, DNP, AGNP-C, AOCNP [facility] [phone] (clinic) 07/18/2024

## 2024-07-18 NOTE — Telephone Encounter (Signed)
 Per los today 10/9 pt needs next available lab/injection.   I called and spoke with pt daughter to confirm appt time on Monday 10/13 for this.   I also let her know there are more appts that have been scheduled but we can print them out Monday and go over any that need changed so it doesn't get confusing.   Pt daughter agreed and confirmed Monday appts.

## 2024-07-19 ENCOUNTER — Other Ambulatory Visit: Payer: Self-pay

## 2024-07-19 ENCOUNTER — Encounter: Payer: Self-pay | Admitting: Oncology

## 2024-07-19 DIAGNOSIS — D631 Anemia in chronic kidney disease: Secondary | ICD-10-CM

## 2024-07-22 ENCOUNTER — Inpatient Hospital Stay

## 2024-07-22 ENCOUNTER — Ambulatory Visit

## 2024-07-22 ENCOUNTER — Other Ambulatory Visit

## 2024-07-22 VITALS — BP 161/63

## 2024-07-22 DIAGNOSIS — D631 Anemia in chronic kidney disease: Secondary | ICD-10-CM

## 2024-07-22 DIAGNOSIS — N1832 Chronic kidney disease, stage 3b: Secondary | ICD-10-CM | POA: Diagnosis not present

## 2024-07-22 LAB — CBC (CANCER CENTER ONLY)
HCT: 28.1 % — ABNORMAL LOW (ref 36.0–46.0)
Hemoglobin: 9.2 g/dL — ABNORMAL LOW (ref 12.0–15.0)
MCH: 30.7 pg (ref 26.0–34.0)
MCHC: 32.7 g/dL (ref 30.0–36.0)
MCV: 93.7 fL (ref 80.0–100.0)
Platelet Count: 171 K/uL (ref 150–400)
RBC: 3 MIL/uL — ABNORMAL LOW (ref 3.87–5.11)
RDW: 14.5 % (ref 11.5–15.5)
WBC Count: 5.2 K/uL (ref 4.0–10.5)
nRBC: 0 % (ref 0.0–0.2)

## 2024-07-22 LAB — IRON AND TIBC
Iron: 72 ug/dL (ref 28–170)
Saturation Ratios: 30 % (ref 10.4–31.8)
TIBC: 242 ug/dL — ABNORMAL LOW (ref 250–450)
UIBC: 170 ug/dL

## 2024-07-22 LAB — FERRITIN: Ferritin: 160 ng/mL (ref 11–307)

## 2024-07-22 MED ORDER — EPOETIN ALFA-EPBX 10000 UNIT/ML IJ SOLN
10000.0000 [IU] | Freq: Once | INTRAMUSCULAR | Status: AC
  Administered 2024-07-22: 10000 [IU] via SUBCUTANEOUS
  Filled 2024-07-22: qty 1

## 2024-07-29 ENCOUNTER — Other Ambulatory Visit: Payer: Self-pay | Admitting: Family Medicine

## 2024-07-30 DIAGNOSIS — M48062 Spinal stenosis, lumbar region with neurogenic claudication: Secondary | ICD-10-CM | POA: Diagnosis not present

## 2024-07-30 DIAGNOSIS — M5416 Radiculopathy, lumbar region: Secondary | ICD-10-CM | POA: Diagnosis not present

## 2024-07-31 DIAGNOSIS — N2581 Secondary hyperparathyroidism of renal origin: Secondary | ICD-10-CM | POA: Diagnosis not present

## 2024-07-31 DIAGNOSIS — E785 Hyperlipidemia, unspecified: Secondary | ICD-10-CM | POA: Diagnosis not present

## 2024-07-31 DIAGNOSIS — N184 Chronic kidney disease, stage 4 (severe): Secondary | ICD-10-CM | POA: Diagnosis not present

## 2024-07-31 DIAGNOSIS — I129 Hypertensive chronic kidney disease with stage 1 through stage 4 chronic kidney disease, or unspecified chronic kidney disease: Secondary | ICD-10-CM | POA: Diagnosis not present

## 2024-07-31 DIAGNOSIS — D631 Anemia in chronic kidney disease: Secondary | ICD-10-CM | POA: Diagnosis not present

## 2024-07-31 DIAGNOSIS — R809 Proteinuria, unspecified: Secondary | ICD-10-CM | POA: Diagnosis not present

## 2024-08-16 ENCOUNTER — Other Ambulatory Visit

## 2024-08-22 ENCOUNTER — Inpatient Hospital Stay

## 2024-08-22 ENCOUNTER — Inpatient Hospital Stay: Attending: Oncology

## 2024-08-22 VITALS — BP 169/66

## 2024-08-22 DIAGNOSIS — D631 Anemia in chronic kidney disease: Secondary | ICD-10-CM | POA: Insufficient documentation

## 2024-08-22 DIAGNOSIS — N1832 Chronic kidney disease, stage 3b: Secondary | ICD-10-CM | POA: Insufficient documentation

## 2024-08-22 DIAGNOSIS — D509 Iron deficiency anemia, unspecified: Secondary | ICD-10-CM | POA: Diagnosis not present

## 2024-08-22 LAB — CMP (CANCER CENTER ONLY)
ALT: 11 U/L (ref 0–44)
AST: 18 U/L (ref 15–41)
Albumin: 4.2 g/dL (ref 3.5–5.0)
Alkaline Phosphatase: 50 U/L (ref 38–126)
Anion gap: 9 (ref 5–15)
BUN: 26 mg/dL — ABNORMAL HIGH (ref 8–23)
CO2: 23 mmol/L (ref 22–32)
Calcium: 9.5 mg/dL (ref 8.9–10.3)
Chloride: 110 mmol/L (ref 98–111)
Creatinine: 1.83 mg/dL — ABNORMAL HIGH (ref 0.44–1.00)
GFR, Estimated: 27 mL/min — ABNORMAL LOW (ref >60)
Glucose, Bld: 116 mg/dL — ABNORMAL HIGH (ref 70–99)
Potassium: 3.3 mmol/L — ABNORMAL LOW (ref 3.5–5.1)
Sodium: 142 mmol/L (ref 135–145)
Total Bilirubin: 0.6 mg/dL (ref 0.0–1.2)
Total Protein: 7.6 g/dL (ref 6.5–8.1)

## 2024-08-22 LAB — CBC WITH DIFFERENTIAL (CANCER CENTER ONLY)
Abs Immature Granulocytes: 0.03 K/uL (ref 0.00–0.07)
Basophils Absolute: 0.1 K/uL (ref 0.0–0.1)
Basophils Relative: 1 %
Eosinophils Absolute: 0.1 K/uL (ref 0.0–0.5)
Eosinophils Relative: 1 %
HCT: 29.7 % — ABNORMAL LOW (ref 36.0–46.0)
Hemoglobin: 9.9 g/dL — ABNORMAL LOW (ref 12.0–15.0)
Immature Granulocytes: 0 %
Lymphocytes Relative: 19 %
Lymphs Abs: 1.5 K/uL (ref 0.7–4.0)
MCH: 31.5 pg (ref 26.0–34.0)
MCHC: 33.3 g/dL (ref 30.0–36.0)
MCV: 94.6 fL (ref 80.0–100.0)
Monocytes Absolute: 0.5 K/uL (ref 0.1–1.0)
Monocytes Relative: 6 %
Neutro Abs: 5.6 K/uL (ref 1.7–7.7)
Neutrophils Relative %: 73 %
Platelet Count: 175 K/uL (ref 150–400)
RBC: 3.14 MIL/uL — ABNORMAL LOW (ref 3.87–5.11)
RDW: 13.6 % (ref 11.5–15.5)
WBC Count: 7.8 K/uL (ref 4.0–10.5)
nRBC: 0 % (ref 0.0–0.2)

## 2024-08-22 LAB — IRON AND TIBC
Iron: 36 ug/dL (ref 28–170)
Saturation Ratios: 13 % (ref 10.4–31.8)
TIBC: 270 ug/dL (ref 250–450)
UIBC: 234 ug/dL

## 2024-08-22 LAB — FERRITIN: Ferritin: 285 ng/mL (ref 11–307)

## 2024-08-22 MED ORDER — EPOETIN ALFA-EPBX 10000 UNIT/ML IJ SOLN: 10000.0000 [IU] | Freq: Once | INTRAMUSCULAR | Status: DC

## 2024-08-22 NOTE — Progress Notes (Signed)
 Contacted Tinnie Dawn at 15:13. Verbal order to hold Retacrit, patient advised to go to PCP for management. Should come back in 1-2 weeks for repeat labs and possible injection.

## 2024-08-25 ENCOUNTER — Emergency Department

## 2024-08-25 ENCOUNTER — Other Ambulatory Visit: Payer: Self-pay

## 2024-08-25 ENCOUNTER — Encounter: Payer: Self-pay | Admitting: Internal Medicine

## 2024-08-25 ENCOUNTER — Inpatient Hospital Stay
Admission: EM | Admit: 2024-08-25 | Discharge: 2024-09-01 | DRG: 689 | Disposition: A | Discharge status: Home Health | Attending: Internal Medicine | Admitting: Internal Medicine

## 2024-08-25 DIAGNOSIS — G4700 Insomnia, unspecified: Secondary | ICD-10-CM | POA: Diagnosis present

## 2024-08-25 DIAGNOSIS — Z90711 Acquired absence of uterus with remaining cervical stump: Secondary | ICD-10-CM

## 2024-08-25 DIAGNOSIS — M069 Rheumatoid arthritis, unspecified: Secondary | ICD-10-CM | POA: Diagnosis present

## 2024-08-25 DIAGNOSIS — E782 Mixed hyperlipidemia: Secondary | ICD-10-CM | POA: Diagnosis present

## 2024-08-25 DIAGNOSIS — Z79899 Other long term (current) drug therapy: Secondary | ICD-10-CM

## 2024-08-25 DIAGNOSIS — R7881 Bacteremia: Secondary | ICD-10-CM | POA: Diagnosis present

## 2024-08-25 DIAGNOSIS — Z800 Family history of malignant neoplasm of digestive organs: Secondary | ICD-10-CM

## 2024-08-25 DIAGNOSIS — I2510 Atherosclerotic heart disease of native coronary artery without angina pectoris: Secondary | ICD-10-CM | POA: Diagnosis present

## 2024-08-25 DIAGNOSIS — I480 Paroxysmal atrial fibrillation: Secondary | ICD-10-CM | POA: Diagnosis present

## 2024-08-25 DIAGNOSIS — Z833 Family history of diabetes mellitus: Secondary | ICD-10-CM

## 2024-08-25 DIAGNOSIS — Z803 Family history of malignant neoplasm of breast: Secondary | ICD-10-CM

## 2024-08-25 DIAGNOSIS — F05 Delirium due to known physiological condition: Secondary | ICD-10-CM | POA: Diagnosis present

## 2024-08-25 DIAGNOSIS — N3000 Acute cystitis without hematuria: Secondary | ICD-10-CM

## 2024-08-25 DIAGNOSIS — A419 Sepsis, unspecified organism: Principal | ICD-10-CM

## 2024-08-25 DIAGNOSIS — Z1152 Encounter for screening for COVID-19: Secondary | ICD-10-CM

## 2024-08-25 DIAGNOSIS — F03B18 Unspecified dementia, moderate, with other behavioral disturbance: Secondary | ICD-10-CM | POA: Diagnosis present

## 2024-08-25 DIAGNOSIS — B9620 Unspecified Escherichia coli [E. coli] as the cause of diseases classified elsewhere: Secondary | ICD-10-CM | POA: Diagnosis present

## 2024-08-25 DIAGNOSIS — Z8249 Family history of ischemic heart disease and other diseases of the circulatory system: Secondary | ICD-10-CM | POA: Diagnosis not present

## 2024-08-25 DIAGNOSIS — I129 Hypertensive chronic kidney disease with stage 1 through stage 4 chronic kidney disease, or unspecified chronic kidney disease: Secondary | ICD-10-CM | POA: Diagnosis present

## 2024-08-25 DIAGNOSIS — G9341 Metabolic encephalopathy: Secondary | ICD-10-CM | POA: Diagnosis present

## 2024-08-25 DIAGNOSIS — Z7901 Long term (current) use of anticoagulants: Secondary | ICD-10-CM | POA: Diagnosis not present

## 2024-08-25 DIAGNOSIS — I951 Orthostatic hypotension: Secondary | ICD-10-CM | POA: Diagnosis present

## 2024-08-25 DIAGNOSIS — N390 Urinary tract infection, site not specified: Principal | ICD-10-CM | POA: Diagnosis present

## 2024-08-25 DIAGNOSIS — E785 Hyperlipidemia, unspecified: Secondary | ICD-10-CM | POA: Diagnosis present

## 2024-08-25 DIAGNOSIS — K219 Gastro-esophageal reflux disease without esophagitis: Secondary | ICD-10-CM | POA: Diagnosis present

## 2024-08-25 DIAGNOSIS — R4182 Altered mental status, unspecified: Secondary | ICD-10-CM | POA: Diagnosis present

## 2024-08-25 DIAGNOSIS — Z8739 Personal history of other diseases of the musculoskeletal system and connective tissue: Secondary | ICD-10-CM

## 2024-08-25 DIAGNOSIS — F03B0 Unspecified dementia, moderate, without behavioral disturbance, psychotic disturbance, mood disturbance, and anxiety: Secondary | ICD-10-CM | POA: Diagnosis present

## 2024-08-25 DIAGNOSIS — I10 Essential (primary) hypertension: Secondary | ICD-10-CM | POA: Diagnosis present

## 2024-08-25 DIAGNOSIS — N184 Chronic kidney disease, stage 4 (severe): Secondary | ICD-10-CM | POA: Diagnosis present

## 2024-08-25 LAB — CBC WITH DIFFERENTIAL/PLATELET
Abs Immature Granulocytes: 0.04 K/uL (ref 0.00–0.07)
Basophils Absolute: 0 K/uL (ref 0.0–0.1)
Basophils Relative: 0 %
Eosinophils Absolute: 0 K/uL (ref 0.0–0.5)
Eosinophils Relative: 0 %
HCT: 29.8 % — ABNORMAL LOW (ref 36.0–46.0)
Hemoglobin: 9.9 g/dL — ABNORMAL LOW (ref 12.0–15.0)
Immature Granulocytes: 0 %
Lymphocytes Relative: 3 %
Lymphs Abs: 0.3 K/uL — ABNORMAL LOW (ref 0.7–4.0)
MCH: 30.7 pg (ref 26.0–34.0)
MCHC: 33.2 g/dL (ref 30.0–36.0)
MCV: 92.3 fL (ref 80.0–100.0)
Monocytes Absolute: 0.7 K/uL (ref 0.1–1.0)
Monocytes Relative: 7 %
Neutro Abs: 8.6 K/uL — ABNORMAL HIGH (ref 1.7–7.7)
Neutrophils Relative %: 90 %
Platelets: 139 K/uL — ABNORMAL LOW (ref 150–400)
RBC: 3.23 MIL/uL — ABNORMAL LOW (ref 3.87–5.11)
RDW: 13.4 % (ref 11.5–15.5)
WBC: 9.7 K/uL (ref 4.0–10.5)
nRBC: 0 % (ref 0.0–0.2)

## 2024-08-25 LAB — BLOOD CULTURE ID PANEL (REFLEXED) - BCID2

## 2024-08-25 LAB — COMPREHENSIVE METABOLIC PANEL WITH GFR
ALT: 9 U/L (ref 0–44)
AST: 19 U/L (ref 15–41)
Albumin: 3.1 g/dL — ABNORMAL LOW (ref 3.5–5.0)
Alkaline Phosphatase: 61 U/L (ref 38–126)
Anion gap: 12 (ref 5–15)
BUN: 31 mg/dL — ABNORMAL HIGH (ref 8–23)
CO2: 20 mmol/L — ABNORMAL LOW (ref 22–32)
Calcium: 8.6 mg/dL — ABNORMAL LOW (ref 8.9–10.3)
Chloride: 105 mmol/L (ref 98–111)
Creatinine, Ser: 2.36 mg/dL — ABNORMAL HIGH (ref 0.44–1.00)
GFR, Estimated: 20 mL/min — ABNORMAL LOW (ref >60)
Glucose, Bld: 114 mg/dL — ABNORMAL HIGH (ref 70–99)
Potassium: 3.3 mmol/L — ABNORMAL LOW (ref 3.5–5.1)
Sodium: 137 mmol/L (ref 135–145)
Total Bilirubin: 0.8 mg/dL (ref 0.0–1.2)
Total Protein: 5.8 g/dL — ABNORMAL LOW (ref 6.5–8.1)

## 2024-08-25 LAB — LACTIC ACID, PLASMA: Lactic Acid, Venous: 1 mmol/L (ref 0.5–1.9)

## 2024-08-25 LAB — TROPONIN T, HIGH SENSITIVITY
Troponin T High Sensitivity: 34 ng/L — ABNORMAL HIGH (ref 0–19)
Troponin T High Sensitivity: 45 ng/L — ABNORMAL HIGH (ref 0–19)

## 2024-08-25 LAB — RESP PANEL BY RT-PCR (RSV, FLU A&B, COVID)  RVPGX2
Influenza A by PCR: NEGATIVE
Influenza B by PCR: NEGATIVE
Resp Syncytial Virus by PCR: NEGATIVE
SARS Coronavirus 2 by RT PCR: NEGATIVE

## 2024-08-25 LAB — URINALYSIS, W/ REFLEX TO CULTURE (INFECTION SUSPECTED)
Bilirubin Urine: NEGATIVE
Glucose, UA: NEGATIVE mg/dL
Ketones, ur: NEGATIVE mg/dL
Nitrite: NEGATIVE
Protein, ur: >=300 mg/dL — AB
RBC / HPF: >50 RBC/hpf (ref 0–5)
Specific Gravity, Urine: 1.011 (ref 1.005–1.030)
WBC, UA: >50 WBC/hpf (ref 0–5)
pH: 5 (ref 5.0–8.0)

## 2024-08-25 LAB — PROTIME-INR
INR: 1.3 — ABNORMAL HIGH (ref 0.8–1.2)
Prothrombin Time: 16.9 s — ABNORMAL HIGH (ref 11.4–15.2)

## 2024-08-25 LAB — CK: Total CK: 135 U/L (ref 38–234)

## 2024-08-25 MED ORDER — METRONIDAZOLE 500 MG/100ML IV SOLN
500.0000 mg | Freq: Once | INTRAVENOUS | Status: AC
  Administered 2024-08-25: 500 mg via INTRAVENOUS
  Filled 2024-08-25: qty 100

## 2024-08-25 MED ORDER — PANTOPRAZOLE SODIUM 40 MG PO TBEC
40.0000 mg | DELAYED_RELEASE_TABLET | Freq: Every day | ORAL | Status: DC
  Administered 2024-08-26 – 2024-09-01 (×7): 40 mg via ORAL
  Filled 2024-08-25 (×7): qty 1

## 2024-08-25 MED ORDER — SODIUM CHLORIDE 0.9 % IV SOLN: 2.0000 g | INTRAVENOUS | Status: DC

## 2024-08-25 MED ORDER — METRONIDAZOLE 500 MG/100ML IV SOLN
500.0000 mg | Freq: Two times a day (BID) | INTRAVENOUS | Status: DC
  Administered 2024-08-25: 500 mg via INTRAVENOUS
  Filled 2024-08-25: qty 100

## 2024-08-25 MED ORDER — LACTATED RINGERS IV SOLN
150.0000 mL/h | INTRAVENOUS | Status: DC
  Administered 2024-08-25 (×2): 150 mL/h via INTRAVENOUS

## 2024-08-25 MED ORDER — LACTATED RINGERS IV BOLUS
1000.0000 mL | Freq: Once | INTRAVENOUS | Status: AC
  Administered 2024-08-25: 1000 mL via INTRAVENOUS

## 2024-08-25 MED ORDER — VANCOMYCIN VARIABLE DOSE PER UNSTABLE RENAL FUNCTION (PHARMACIST DOSING): Status: DC

## 2024-08-25 MED ORDER — ONDANSETRON HCL 4 MG PO TABS
4.0000 mg | ORAL_TABLET | Freq: Four times a day (QID) | ORAL | Status: AC | PRN
Start: 2024-08-25 — End: 2024-08-30

## 2024-08-25 MED ORDER — VANCOMYCIN HCL IN DEXTROSE 1-5 GM/200ML-% IV SOLN
1000.0000 mg | Freq: Once | INTRAVENOUS | Status: AC
  Administered 2024-08-25: 1000 mg via INTRAVENOUS
  Filled 2024-08-25: qty 200

## 2024-08-25 MED ORDER — ACETAMINOPHEN 325 MG PO TABS
650.0000 mg | ORAL_TABLET | Freq: Four times a day (QID) | ORAL | Status: AC | PRN
  Administered 2024-08-25 – 2024-08-29 (×5): 650 mg via ORAL
  Filled 2024-08-25 (×5): qty 2

## 2024-08-25 MED ORDER — ACETAMINOPHEN 650 MG RE SUPP
650.0000 mg | Freq: Four times a day (QID) | RECTAL | Status: AC | PRN
Start: 2024-08-25 — End: 2024-08-30

## 2024-08-25 MED ORDER — ACETAMINOPHEN 500 MG PO TABS
1000.0000 mg | ORAL_TABLET | Freq: Once | ORAL | Status: AC
  Administered 2024-08-25: 1000 mg via ORAL
  Filled 2024-08-25: qty 2

## 2024-08-25 MED ORDER — APIXABAN 2.5 MG PO TABS
2.5000 mg | ORAL_TABLET | Freq: Two times a day (BID) | ORAL | Status: DC
  Administered 2024-08-25 – 2024-09-01 (×13): 2.5 mg via ORAL
  Filled 2024-08-25 (×13): qty 1

## 2024-08-25 MED ORDER — SODIUM CHLORIDE 0.9 % IV SOLN
2.0000 g | Freq: Once | INTRAVENOUS | Status: AC
  Administered 2024-08-25: 2 g via INTRAVENOUS
  Filled 2024-08-25: qty 12.5

## 2024-08-25 MED ORDER — SENNOSIDES-DOCUSATE SODIUM 8.6-50 MG PO TABS: 1.0000 | ORAL_TABLET | Freq: Every evening | ORAL | Status: DC | PRN

## 2024-08-25 MED ORDER — ROSUVASTATIN CALCIUM 10 MG PO TABS
10.0000 mg | ORAL_TABLET | Freq: Every day | ORAL | Status: DC
  Administered 2024-08-25 – 2024-08-31 (×6): 10 mg via ORAL
  Filled 2024-08-25 (×6): qty 1

## 2024-08-25 MED ORDER — ONDANSETRON HCL 4 MG/2ML IJ SOLN: 4.0000 mg | Freq: Four times a day (QID) | INTRAMUSCULAR | Status: AC | PRN

## 2024-08-25 MED ORDER — HYDRALAZINE HCL 20 MG/ML IJ SOLN: 5.0000 mg | Freq: Four times a day (QID) | INTRAMUSCULAR | Status: AC | PRN

## 2024-08-25 MED ORDER — SODIUM CHLORIDE 0.9 % IV SOLN
2.0000 g | INTRAVENOUS | Status: DC
  Administered 2024-08-26 – 2024-08-28 (×3): 2 g via INTRAVENOUS
  Filled 2024-08-25 (×4): qty 20

## 2024-08-25 NOTE — ED Provider Notes (Signed)
 [facility] Provider Note    Event Date/Time   First MD Initiated Contact with Patient 08/25/24 1041     (approximate)   History   poss UTI   HPI  [name] is a 83 y.o. female with hypertension, CKD who comes in with concerns for altered mental status.  Patient was here due to concerns for not eating not drinking for about 1 week.  They noted patient to be more lethargic today.  When EMS got there she was noted to have a fever of 103.  She does report some right sided abdominal pain.  According to family they were concerned that she was breathing harder than normal but patient denies any chest pain or shortness of breath.  Physical Exam   Triage Vital Signs: ED Triage Vitals  Encounter Vitals Group     BP 08/25/24 1046 (!) 159/63     Girls Systolic BP Percentile --      Girls Diastolic BP Percentile --      Boys Systolic BP Percentile --      Boys Diastolic BP Percentile --      Pulse Rate 08/25/24 1046 84     Resp 08/25/24 1046 18     Temp 08/25/24 1046 (!) 102.7 F (39.3 C)     Temp Source 08/25/24 1046 Oral     SpO2 08/25/24 1046 96 %     Weight --      Height 08/25/24 1047 5' 3 (1.6 m)     Head Circumference --      Peak Flow --      Pain Score 08/25/24 1046 4     Pain Loc --      Pain Education --      Exclude from Growth Chart --     Most recent vital signs: Vitals:   08/25/24 1046  BP: (!) 159/63  Pulse: 84  Resp: 18  Temp: (!) 102.7 F (39.3 C)  SpO2: 96%     General: Awake, no distress.  CV:  Good peripheral perfusion.  Resp:  Normal effort.  Clear lung Abd:  No distention.  Tender on the right side of the abdomen Other:  Patient is alert and oriented she is able to move all extremities but just states feels very weak so unable to lift her legs up off the bed but able to wiggle her toes.  No obvious cranial nerve deficits   ED Results / Procedures / Treatments   Labs (all labs ordered are listed, but only  abnormal results are displayed) Labs Reviewed  RESP PANEL BY RT-PCR (RSV, FLU A&B, COVID)  [id number]  CULTURE, BLOOD (ROUTINE X 2)  CULTURE, BLOOD (ROUTINE X 2)  LACTIC ACID, PLASMA  LACTIC ACID, PLASMA  COMPREHENSIVE METABOLIC PANEL WITH GFR  CBC WITH DIFFERENTIAL/PLATELET  PROTIME-INR  URINALYSIS, W/ REFLEX TO CULTURE (INFECTION SUSPECTED)     EKG  My interpretation of EKG:  Normal sinus rate of 83 without any ST elevation or T wave inversions, normal intervals  RADIOLOGY I have reviewed the xray personally and interpreted no evidence of any pneumonia.  Some vascular congestion noted   PROCEDURES:  Critical Care performed: Yes, see critical care procedure note(s)  .1-3 Lead EKG Interpretation  Performed by: Ernest Ronal BRAVO, MD Authorized by: Ernest Ronal BRAVO, MD     Interpretation: normal     ECG rate:  60   ECG rate assessment: normal     Rhythm: sinus rhythm  Ectopy: none   .Critical Care  Performed by: Ernest Ronal BRAVO, MD Authorized by: Ernest Ronal BRAVO, MD   Critical care provider statement:    Critical care time (minutes):  30   Critical care was necessary to treat or prevent imminent or life-threatening deterioration of the following conditions:  Sepsis   Critical care was time spent personally by me on the following activities:  Development of treatment plan with patient or surrogate, discussions with consultants, evaluation of patient's response to treatment, examination of patient, ordering and review of laboratory studies, ordering and review of radiographic studies, ordering and performing treatments and interventions, pulse oximetry, re-evaluation of patient's condition and review of old charts    MEDICATIONS ORDERED IN ED: Medications  rosuvastatin  (CRESTOR ) tablet 10 mg (has no administration in time range)  pantoprazole  (PROTONIX ) EC tablet 40 mg (has no administration in time range)  lactated ringers  infusion (150 mL/hr Intravenous New Bag/Given 08/25/24  1345)  acetaminophen  (TYLENOL ) tablet 650 mg (has no administration in time range)    Or  acetaminophen  (TYLENOL ) suppository 650 mg (has no administration in time range)  ondansetron  (ZOFRAN ) tablet 4 mg (has no administration in time range)    Or  ondansetron  (ZOFRAN ) injection 4 mg (has no administration in time range)  apixaban  (ELIQUIS ) tablet 2.5 mg (has no administration in time range)  metroNIDAZOLE (FLAGYL) IVPB 500 mg (has no administration in time range)  senna-docusate (Senokot-S) tablet 1 tablet (has no administration in time range)  ceFEPIme (MAXIPIME) 2 g in sodium chloride  0.9 % 100 mL IVPB (has no administration in time range)  hydrALAZINE  (APRESOLINE ) injection 5 mg (has no administration in time range)  vancomycin variable dose per unstable renal function (pharmacist dosing) (has no administration in time range)  ceFEPIme (MAXIPIME) 2 g in sodium chloride  0.9 % 100 mL IVPB (0 g Intravenous Stopped 08/25/24 1155)  metroNIDAZOLE (FLAGYL) IVPB 500 mg (0 mg Intravenous Stopped 08/25/24 1209)  vancomycin (VANCOCIN) IVPB 1000 mg/200 mL premix (0 mg Intravenous Stopped 08/25/24 1221)  acetaminophen  (TYLENOL ) tablet 1,000 mg (1,000 mg Oral Given 08/25/24 1113)  lactated ringers  bolus 1,000 mL (0 mLs Intravenous Stopped 08/25/24 1308)  lactated ringers  bolus 1,000 mL (0 mLs Intravenous Stopped 08/25/24 1308)     IMPRESSION / MDM / ASSESSMENT AND PLAN / ED COURSE  I reviewed the triage vital signs and the nursing notes.   Patient's presentation is most consistent with acute presentation with potential threat to life or bodily function.    Patient comes in with concerns for fevers, altered mental status sepsis workup was initiated 2 L of fluid was ordered to help with concerns for sepsis.  I did order CT imaging of the head as well as abdomen given her altered mental status and she did report a little bit of pain in her abdomen on examination.  She has no obvious deficits to  suggest stroke.  Her troponin was slightly elevated but more likely demand.  CMP shows creatinine that is little bit higher than last check may be a slight AKI.  Urine does look consistent with UTI with many bacteria and urine culture is in process.  COVID test was negative.  Discharge broad-spectrum antibiotics and patient initially got here.   Patient's chest x-ray did show some vascular congestion and would hold off on any fluid resuscitation given vital signs are reassuring and did not want to fluid overload her.  CT imaging of the head was reassuring  CT imaging of the abdomen shows  some mild free fluid with some mass on the kidney and some cystic lesions.  I did run it by Dr. Cesar and he did not see evidence of other acute pathology and the free fluid is most likely related to the masses and cystic lesions.  Patient reports feeling improved.  Did update family about incidental findings on CT imaging.  Will discuss possible team for admission due to concerns for urosepsis.  The patient is on the cardiac monitor to evaluate for evidence of arrhythmia and/or significant heart rate changes.      FINAL CLINICAL IMPRESSION(S) / ED DIAGNOSES   Final diagnoses:  Sepsis, due to unspecified organism, unspecified whether acute organ dysfunction present [facility])  Acute cystitis without hematuria     Rx / DC Orders   ED Discharge Orders     None        Note:  This document was prepared using Dragon voice recognition software and may include unintentional dictation errors.   Ernest Ronal BRAVO, MD 08/25/24 [phone]

## 2024-08-25 NOTE — ED Notes (Signed)
 Informed RN bed assigned

## 2024-08-25 NOTE — Assessment & Plan Note (Signed)
 At baseline

## 2024-08-25 NOTE — Plan of Care (Signed)
  Problem: Fluid Volume: Goal: Hemodynamic stability will improve Outcome: Progressing   Problem: Education: Goal: Knowledge of General Education information will improve Description: Including pain rating scale, medication(s)/side effects and non-pharmacologic comfort measures Outcome: Progressing   Problem: Health Behavior/Discharge Planning: Goal: Ability to manage health-related needs will improve Outcome: Progressing   Problem: Activity: Goal: Risk for activity intolerance will decrease Outcome: Progressing   Problem: Nutrition: Goal: Adequate nutrition will be maintained Outcome: Progressing   Problem: Elimination: Goal: Will not experience complications related to bowel motility Outcome: Progressing   Problem: Pain Managment: Goal: General experience of comfort will improve and/or be controlled Outcome: Progressing   Problem: Safety: Goal: Ability to remain free from injury will improve Outcome: Progressing

## 2024-08-25 NOTE — Consult Note (Signed)
 Pharmacy Antibiotic Note  [name] is a 83 y.o. female admitted on 08/25/2024 with sepsis 2/2 UTI presenting with altered mental status.  Pharmacy has been consulted for Vancomycin and cefepime dosing.  Plan:  Cefepime 2G q24h Vancomycin 1000 mg  LD given at 1110 on 11/16. Patient has AKI on CKD4 Scr. 2.36 on arrival. Will order vancomycin random level in 48h. Pharmacy dosing for variable renal function.    Expected AUC: 513 SCr used: 2.36  Weight used for dosing: IBW Cmin: 14.1 Will monitor serum creatinine daily while on vanc. Follow renal function and cultures for adjustments    Height: 5' 3 (160 cm) Weight: 59.6 kg (131 lb 6.3 oz) IBW/kg (Calculated) : 52.4  Temp (24hrs), Avg:102.7 F (39.3 C), Min:102.7 F (39.3 C), Max:102.7 F (39.3 C)  Recent Labs  Lab 08/22/24 1414 08/22/24 1415 08/25/24 1048 08/25/24 1154  WBC 7.8  --  9.7  --   CREATININE  --  1.83*  --  2.36*  LATICACIDVEN  --   --  1.0  --     Estimated Creatinine Clearance: 15.2 mL/min (A) (by C-G formula based on SCr of 2.36 mg/dL (H)).    Allergies  Allergen Reactions   Amlodipine      Ankle swelling   Celecoxib Other (See Comments) and Rash    Tachycardia/palpitations Other reaction(s): Other Tachycardia/palpitations Tachycardia/palpitations   Tramadol  Nausea Only    Antimicrobials this admission: 11/16 Vancomycin >>  11/16 Cefepime >>    Microbiology results: 11/16 BCx: pending 11/16 UCx: pending   Thank you for allowing pharmacy to be a part of this patient's care.  [name] 08/25/2024 1:31 PM

## 2024-08-25 NOTE — Hospital Course (Signed)
 [name] is a 83 year old female with history of dementia, atrial fibrillation on Eliquis , CKD stage IV, rheumatoid arthritis, hypertension, hyperlipidemia, history of left hepatic lobe cyst, who presents ED for chief concerns of altered mental status and fever.  Vitals in the ED showed t of 102, rr of 18, on repeat is 27, heart rate 84, on repeat is 69, blood pressure 159/62, SpO2 of 96% on room air.  Serum sodium is 137, potassium 3.3, chloride 105, bicarb 20, BUN of 31, serum creatinine of 2.36, eGFR 20, nonfasting blood glucose 114, WBC 9.7, hemoglobin 9.9, platelets of 139.  COVID/influenza A/influenza B/RSV PCR were negative.  HS troponin was 34.  UA was positive for moderate leukocytes, negative for nitrates.  Positive for large hemoglobin.  Lactic acid was 1.0.  ED treatment: Vancomycin per pharmacy cefepime per pharmacy, metronidazole 500 mg IV one-time dose, acetaminophen  1000 mg p.o. one-time dose, LR 2 L bolus.

## 2024-08-25 NOTE — Assessment & Plan Note (Signed)
-   Rosuvastatin 10 mg nightly resume

## 2024-08-25 NOTE — Assessment & Plan Note (Signed)
 Hydralazine 5 mg IV every 6 hours as needed for SBP greater 165, 5 days ordered

## 2024-08-25 NOTE — Assessment & Plan Note (Signed)
 Home PPI resume

## 2024-08-25 NOTE — ED Notes (Signed)
 Pt up to use restroom, pt refusing to use bedpan. Pt requiring two person assist, family able to help. Pt placed back in bed, repositioned and covered with blankets.

## 2024-08-25 NOTE — ED Triage Notes (Signed)
 Pt BIB [facility] from home for possible UTI. Per family pt has not been eating or drinking much in the past week. Family told EMS has hx of UTI, upon EMS arrival pt was lethargic with temp of 103.3, 84 NSR, 95%RA, 33 CO2, 28 rr, 150/70, 119 cbg. Pt does have hx of high BP takes amlodipine . Pt has not taken any home meds today.  20g R AC

## 2024-08-25 NOTE — Assessment & Plan Note (Signed)
 Blood culture x 2, urine culture in process Continue cefepime, vancomycin, metronidazole Maintain MAP greater than 65 Status post 2 L LR bolus per EDP Continue with LR infusion at 150 mL/h, 20 hours ordered Check BMP in a.m.

## 2024-08-25 NOTE — Sepsis Progress Note (Signed)
 Elink following code sepsis

## 2024-08-25 NOTE — Progress Notes (Signed)
 CODE SEPSIS - PHARMACY COMMUNICATION  **Broad Spectrum Antibiotics should be administered within 1 hour of Sepsis diagnosis**  Time Code Sepsis Called/Page Received: 1048  Antibiotics Ordered: cefepime, vancomycin, Flagyl  Time of 1st antibiotic administration: 1105  Additional action taken by pharmacy: N/A  If necessary, Name of Provider/Nurse Contacted: N/A   Estill CHRISTELLA Lutes, PharmD, BCPS Clinical Pharmacist 08/25/2024 11:21 AM

## 2024-08-25 NOTE — Progress Notes (Signed)
 PHARMACY - PHYSICIAN COMMUNICATION CRITICAL VALUE ALERT - BLOOD CULTURE IDENTIFICATION (BCID)  Results for orders placed or performed during the hospital encounter of 08/25/24  Resp panel by RT-PCR (RSV, Flu A&B, Covid) Anterior Nasal Swab     Status: None   Collection Time: 08/25/24 10:48 AM   Specimen: Anterior Nasal Swab  Result Value Ref Range Status   SARS Coronavirus 2 by RT PCR NEGATIVE NEGATIVE Final    Comment: (NOTE) SARS-CoV-2 target nucleic acids are NOT DETECTED.  The SARS-CoV-2 RNA is generally detectable in upper respiratory specimens during the acute phase of infection. The lowest concentration of SARS-CoV-2 viral copies this assay can detect is 138 copies/mL. A negative result does not preclude SARS-Cov-2 infection and should not be used as the sole basis for treatment or other patient management decisions. A negative result may occur with  improper specimen collection/handling, submission of specimen other than nasopharyngeal swab, presence of viral mutation(s) within the areas targeted by this assay, and inadequate number of viral copies(<138 copies/mL). A negative result must be combined with clinical observations, patient history, and epidemiological information. The expected result is Negative.  Fact Sheet for Patients:  bloggercourse.com  Fact Sheet for Healthcare Providers:  seriousbroker.it  This test is no t yet approved or cleared by the [place]  FDA and  has been authorized for detection and/or diagnosis of SARS-CoV-2 by FDA under an Emergency Use Authorization (EUA). This EUA will remain  in effect (meaning this test can be used) for the duration of the COVID-19 declaration under Section 564(b)(1) of the Act, 21 U.S.C.section 360bbb-3(b)(1), unless the authorization is terminated  or revoked sooner.       Influenza A by PCR NEGATIVE NEGATIVE Final   Influenza B by PCR NEGATIVE NEGATIVE Final     Comment: (NOTE) The Xpert Xpress SARS-CoV-2/FLU/RSV plus assay is intended as an aid in the diagnosis of influenza from Nasopharyngeal swab specimens and should not be used as a sole basis for treatment. Nasal washings and aspirates are unacceptable for Xpert Xpress SARS-CoV-2/FLU/RSV testing.  Fact Sheet for Patients: bloggercourse.com  Fact Sheet for Healthcare Providers: seriousbroker.it  This test is not yet approved or cleared by the [place]  FDA and has been authorized for detection and/or diagnosis of SARS-CoV-2 by FDA under an Emergency Use Authorization (EUA). This EUA will remain in effect (meaning this test can be used) for the duration of the COVID-19 declaration under Section 564(b)(1) of the Act, 21 U.S.C. section 360bbb-3(b)(1), unless the authorization is terminated or revoked.     Resp Syncytial Virus by PCR NEGATIVE NEGATIVE Final    Comment: (NOTE) Fact Sheet for Patients: bloggercourse.com  Fact Sheet for Healthcare Providers: seriousbroker.it  This test is not yet approved or cleared by the [place]  FDA and has been authorized for detection and/or diagnosis of SARS-CoV-2 by FDA under an Emergency Use Authorization (EUA). This EUA will remain in effect (meaning this test can be used) for the duration of the COVID-19 declaration under Section 564(b)(1) of the Act, 21 U.S.C. section 360bbb-3(b)(1), unless the authorization is terminated or revoked.  Performed at [facility], [address] [place], [place] [address]   Blood Culture (routine x 2)     Status: None (Preliminary result)   Collection Time: 08/25/24 10:48 AM   Specimen: BLOOD  Result Value Ref Range Status   Specimen Description BLOOD BLOOD RIGHT HAND  Final   Special Requests   Final    BOTTLES DRAWN AEROBIC AND ANAEROBIC Blood  Culture adequate volume   Culture  Setup  Time   Final    Organism ID to follow IN BOTH AEROBIC AND ANAEROBIC BOTTLES GRAM NEGATIVE RODS CRITICAL RESULT CALLED TO, READ BACK BY AND VERIFIED WITH: Quaneshia Wareing 08/25/24 2300 KLW Performed at [facility], [address] [place], [place] [address]    Culture GRAM NEGATIVE RODS  Final   Report Status PENDING  Incomplete  Blood Culture ID Panel (Reflexed)     Status: Abnormal   Collection Time: 08/25/24 10:48 AM  Result Value Ref Range Status   Enterococcus faecalis NOT DETECTED NOT DETECTED Final   Enterococcus Faecium NOT DETECTED NOT DETECTED Final   Listeria monocytogenes NOT DETECTED NOT DETECTED Final   Staphylococcus species NOT DETECTED NOT DETECTED Final   Staphylococcus aureus (BCID) NOT DETECTED NOT DETECTED Final   Staphylococcus epidermidis NOT DETECTED NOT DETECTED Final   Staphylococcus lugdunensis NOT DETECTED NOT DETECTED Final   Streptococcus species NOT DETECTED NOT DETECTED Final   Streptococcus agalactiae NOT DETECTED NOT DETECTED Final   Streptococcus pneumoniae NOT DETECTED NOT DETECTED Final   Streptococcus pyogenes NOT DETECTED NOT DETECTED Final   A.calcoaceticus-baumannii NOT DETECTED NOT DETECTED Final   Bacteroides fragilis NOT DETECTED NOT DETECTED Final   Enterobacterales DETECTED (A) NOT DETECTED Final    Comment: Enterobacterales represent a large order of gram negative bacteria, not a single organism. CRITICAL RESULT CALLED TO, READ BACK BY AND VERIFIED WITH: Haylin Camilli 08/25/24 2300 KLW    Enterobacter cloacae complex NOT DETECTED NOT DETECTED Final   Escherichia coli DETECTED (A) NOT DETECTED Final    Comment: CRITICAL RESULT CALLED TO, READ BACK BY AND VERIFIED WITH: Alessio Bogan 08/25/24 2300 KLW    Klebsiella aerogenes NOT DETECTED NOT DETECTED Final   Klebsiella oxytoca NOT DETECTED NOT DETECTED Final   Klebsiella pneumoniae NOT DETECTED NOT DETECTED Final   Proteus species NOT DETECTED NOT DETECTED Final   Salmonella  species NOT DETECTED NOT DETECTED Final   Serratia marcescens NOT DETECTED NOT DETECTED Final   Haemophilus influenzae NOT DETECTED NOT DETECTED Final   Neisseria meningitidis NOT DETECTED NOT DETECTED Final   Pseudomonas aeruginosa NOT DETECTED NOT DETECTED Final   Stenotrophomonas maltophilia NOT DETECTED NOT DETECTED Final   Candida albicans NOT DETECTED NOT DETECTED Final   Candida auris NOT DETECTED NOT DETECTED Final   Candida glabrata NOT DETECTED NOT DETECTED Final   Candida krusei NOT DETECTED NOT DETECTED Final   Candida parapsilosis NOT DETECTED NOT DETECTED Final   Candida tropicalis NOT DETECTED NOT DETECTED Final   Cryptococcus neoformans/gattii NOT DETECTED NOT DETECTED Final   CTX-M ESBL NOT DETECTED NOT DETECTED Final   Carbapenem resistance IMP NOT DETECTED NOT DETECTED Final   Carbapenem resistance KPC NOT DETECTED NOT DETECTED Final   Carbapenem resistance NDM NOT DETECTED NOT DETECTED Final   Carbapenem resist OXA 48 LIKE NOT DETECTED NOT DETECTED Final   Carbapenem resistance VIM NOT DETECTED NOT DETECTED Final    Comment: Performed at [facility], [address] [place], [place] [address]  Blood Culture (routine x 2)     Status: None (Preliminary result)   Collection Time: 08/25/24 11:03 AM   Specimen: BLOOD LEFT HAND  Result Value Ref Range Status   Specimen Description BLOOD LEFT HAND  Final   Special Requests   Final    BOTTLES DRAWN AEROBIC AND ANAEROBIC Blood Culture results may not be optimal due to an inadequate volume of blood received in culture bottles  Culture  Setup Time   Final    GRAM NEGATIVE RODS IN BOTH AEROBIC AND ANAEROBIC BOTTLES CRITICAL RESULT CALLED TO, READ BACK BY AND VERIFIED WITH: Kaelie Henigan 08/25/24 2300 KLW Performed at [facility], [address] [place], [place] [address]    Culture GRAM NEGATIVE RODS  Final   Report Status PENDING  Incomplete    BCID Results: 4 of 4 bottles with E. Coli, no  resistance.  Pt currently on Cefepime, Vancomycin, & Flagyl.  Name of provider contacted: DOROTHA Peaches, MD   Changes to prescribed antibiotics required: Transition pt to Ceftriaxone  2 gm q24hr.  Rankin CANDIE Dills, PharmD, [employer] 08/25/2024 11:16 PM

## 2024-08-25 NOTE — H&P (Signed)
 History and Physical   [name] FMW:[id number] DOB: 1941/08/01 DOA: 08/25/2024  PCP: Avelina Greig BRAVO, MD  Patient coming from: home via EMS  I have personally briefly reviewed patient's old medical records in [facility] EMR.  Chief Concern: altered mental status  HPI: [name] is a 83 year old female with history of dementia, atrial fibrillation on Eliquis , CKD stage IV, rheumatoid arthritis, hypertension, hyperlipidemia, history of left hepatic lobe cyst, who presents ED for chief concerns of altered mental status and fever.  Vitals in the ED showed t of 102, rr of 18, on repeat is 27, heart rate 84, on repeat is 69, blood pressure 159/62, SpO2 of 96% on room air.  Serum sodium is 137, potassium 3.3, chloride 105, bicarb 20, BUN of 31, serum creatinine of 2.36, eGFR 20, nonfasting blood glucose 114, WBC 9.7, hemoglobin 9.9, platelets of 139.  COVID/influenza A/influenza B/RSV PCR were negative.  HS troponin was 34.  UA was positive for moderate leukocytes, negative for nitrates.  Positive for large hemoglobin.  Lactic acid was 1.0.  ED treatment: Vancomycin per pharmacy cefepime per pharmacy, metronidazole 500 mg IV one-time dose, acetaminophen  1000 mg p.o. one-time dose, LR 2 L bolus. --------------------------------------- At bedside, patient was able to tell me her first last name, location of hospital.  She was not able to tell me her age, the current calendar year.  She does not know why she is in the hospital.  Social history: Lives at home with her husband.  ROS: unable to complete due to current acute presentation  ED Course: Discussed with EDP, patient requiring hospitalization for chief concerns of complicated UTI.  Assessment/Plan  Principal Problem:   Complicated UTI (urinary tract infection) Active Problems:   CKD (chronic kidney disease) stage 4, GFR 15-29 ml/min (HCC)   HYPERLIPIDEMIA   History of rheumatoid arthritis   Essential  hypertension   Moderate dementia (HCC)   GERD (gastroesophageal reflux disease)   Assessment and Plan:  * Complicated UTI (urinary tract infection) Blood culture x 2, urine culture in process Continue cefepime, vancomycin, metronidazole Maintain MAP greater than 65 Status post 2 L LR bolus per EDP Continue with LR infusion at 150 mL/h, 20 hours ordered Check BMP in a.m.  CKD (chronic kidney disease) stage 4, GFR 15-29 ml/min (HCC) At baseline  GERD (gastroesophageal reflux disease) Home PPI resume  Essential hypertension Hydralazine  5 mg IV every 6 hours as needed for SBP greater 165, 5 days ordered  HYPERLIPIDEMIA Rosuvastatin  10 mg nightly resume  Chart reviewed.   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 cath 2/09: no CAD, EF 70%   GERD (gastroesophageal reflux disease) 07/26/2021   HYPERLIPIDEMIA    HYPERTENSION    Hypertensive urgency 07/19/2022   Mild Aortic insufficiency    Moderate mitral regurgitation    Moderate tricuspid regurgitation    OSTEOPENIA    PAF (paroxysmal atrial fibrillation) (HCC)    a. s/p ablation 2013; b. CHADS2VASc -> 4 (HTN, age x 2, female)-->Eliquis .   PAT (paroxysmal atrial tachycardia)    a. 04/2019 Zio: Occas PACs and rare PVCs. 21 atrial runs -  longest 20 beats, max rate 169.   Pneumonia 2009   RA (rheumatoid arthritis) (HCC)    Sinus Bradycardia    a. asymptomatic but prevents use of AVN blocking agents; b. 04/2019 Zio: Avg HR 61 (37-109).   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 05/2019 Echo: EF 60-65%. DD. RVSP 41.9mmHg.  Mod dil LA. Mod MR/TR. Mild AI.   Past Surgical History:  Procedure Laterality Date   ABLATION OF DYSRHYTHMIC FOCUS     CARDIAC CATHETERIZATION     COLONOSCOPY  04/18/08   COLONOSCOPY WITH PROPOFOL  N/A 02/28/2023   Procedure: COLONOSCOPY WITH PROPOFOL ;  Surgeon: Unk Corinn Skiff, MD;  Location: [facility] ENDOSCOPY;  Service: Gastroenterology;  Laterality: N/A;   ESOPHAGOGASTRODUODENOSCOPY (EGD) WITH PROPOFOL  N/A 02/28/2023   Procedure: ESOPHAGOGASTRODUODENOSCOPY (EGD) WITH PROPOFOL ;  Surgeon: Unk Corinn Skiff, MD;  Location: [facility] ENDOSCOPY;  Service: Gastroenterology;  Laterality: N/A;   Partial hysterectomy--1979     Thoracentesis   12/20/07     Social History:  reports that she has never smoked. She has never used smokeless tobacco. She reports that she does not drink alcohol and does not use drugs.  Allergies  Allergen Reactions   Amlodipine      Ankle swelling   Celecoxib Other (See Comments) and Rash    Tachycardia/palpitations Other reaction(s): Other Tachycardia/palpitations Tachycardia/palpitations   Tramadol  Nausea Only   Family History  Problem Relation Age of Onset   Diabetes Other    Breast cancer Paternal Aunt 42   Heart disease Mother    Pancreatic cancer Father    Atrial fibrillation Brother    Heart disease Brother    Heart attack Brother    Family history: Family history reviewed and not pertinent  Prior to Admission medications   Medication Sig Start Date End Date Taking? Authorizing Provider  acetaminophen  (TYLENOL ) 325 MG tablet Take 1 tablet (325 mg total) by mouth every 6 (six) hours as needed for mild pain (pain score 1-3). 05/27/24   Bedsole, Apple Dearmas E, MD  amiodarone  (PACERONE ) 200 MG tablet TAKE 1/2 TABLET (100 MG TOTAL) BY MOUTH ON MONDAY, TUESDAY, THURSDAY, FRIDAY [facility] 06/05/24   Camnitz, Will Gladis, MD  doxazosin  (CARDURA ) 2 MG tablet TAKE 1 TABLET BY MOUTH DAILY 07/05/24   Bedsole, Christiaan Strebeck E, MD  ELIQUIS  2.5 MG TABS tablet TAKE 1 TABLET BY MOUTH  TWICE A DAY 07/02/24   Bedsole, Eleazar Kimmey E, MD  furosemide  (LASIX ) 20 MG tablet Take 0.5-1 tablets (10-20 mg total) by mouth daily as needed. 06/25/24   Bedsole, Braxten Memmer E, MD  gabapentin  (NEURONTIN ) 100 MG capsule Take 100 mg by mouth 2 (two) times daily. 04/10/24   [provider]  hydrALAZINE  (APRESOLINE ) 50 MG tablet TAKE 1 TABLET BY MOUTH EVERY 8 HOURS. 07/02/24   Bedsole, Sevag Shearn E, MD  LUMIGAN 0.01 % SOLN Apply 1 drop to eye at bedtime. 04/01/22   [provider]  methocarbamol  (ROBAXIN ) 500 MG tablet Take 1 tablet (500 mg total) by mouth 3 (three) times daily as needed for muscle spasms. Take 500 mg by mouth 3 (three) times daily. 05/28/24   Bedsole, Mcdaniel Ohms E, MD  ondansetron  (ZOFRAN ) 4 MG tablet Take 1-2 tablets (4-8 mg total) by mouth every 8 (eight) hours as needed for nausea or vomiting. 05/23/24   Bedsole, Trayvon Trumbull E, MD  pantoprazole  (PROTONIX ) 40 MG tablet TAKE 1 TABLET BY MOUTH DAILY 06/04/24   Bedsole, Lillis Nuttle E, MD  rosuvastatin  (CRESTOR ) 10 MG tablet TAKE 1 TABLET BY MOUTH  DAILY 07/29/24   Bedsole, Tishia Maestre E, MD  SIMBRINZA 1-0.2 % SUSP Place 1 drop into both eyes daily. 10/13/23   [provider]  sucralfate  (CARAFATE ) 1 g tablet Take 1 tablet (1 g total) by mouth 4 (four) times daily -  with meals and at bedtime. 06/04/24   Avelina Greig BRAVO, MD   Physical Exam: Vitals:   08/25/24 1300 08/25/24 1330 08/25/24 1346 08/25/24 1523  BP: (!) 122/54 (!) 111/58  (!) 120/56  Pulse: 71 69  66  Resp: 20 18  18   Temp:   99.3 F (37.4 C) 98.6 F (37 C)  TempSrc:   Oral Oral  SpO2: 98% 97%  99%  Weight:      Height:       Constitutional: appears age-appropriate, frail, NAD, calm Eyes: PERRL, lids and conjunctivae normal ENMT: Mucous membranes are moist. Posterior pharynx clear of any exudate or lesions. Age-appropriate dentition. Hearing appropriate Neck: normal, supple, no masses, no thyromegaly Respiratory: clear to auscultation bilaterally, no wheezing, no crackles. Normal respiratory effort.  No accessory muscle use.  Cardiovascular: Regular rate and rhythm, no murmurs / rubs / gallops. No extremity edema. 2+ pedal pulses. No carotid bruits.  Abdomen: +Suprapubic tenderness, no masses palpated, no hepatosplenomegaly. Bowel sounds positive.  Musculoskeletal: no clubbing / cyanosis. No joint deformity upper and lower extremities. Good ROM, no contractures, no atrophy. Normal muscle tone.  Skin: no rashes, lesions, ulcers. No induration Neurologic: Sensation intact. Strength 5/5 in all 4.  Psychiatric: Lacks judgment and insight. Alert and oriented x self and location only. Normal mood.   EKG: independently reviewed, showing sinus rhythm with rate of 83, QTc 535  Chest x-ray on Admission: I personally reviewed and I agree with radiologist reading as below.  CT ABDOMEN PELVIS WO CONTRAST Result Date: 08/25/2024 EXAM: CT ABDOMEN AND PELVIS WITHOUT CONTRAST 08/25/2024 11:27:00 AM TECHNIQUE: CT of the abdomen and pelvis was performed without the administration of intravenous contrast. Multiplanar reformatted images are provided for review. Automated exposure control, iterative reconstruction, and/or weight-based adjustment of the mA/kV was utilized to reduce the radiation dose to as low as reasonably achievable. COMPARISON: Prior study from 06/19/2024. CLINICAL HISTORY: Abdominal pain, acute, nonlocalized. FINDINGS: LOWER CHEST: Small bilateral pleural effusions, bibasilar subsegmental atelectasis or minimal consolidation. LIVER: Left lobe hepatic cystic lesion now measures 5.5 cm compared to 6.4 cm on the prior study. A few additional subcentimeter hypodensities in the liver are likely cysts. GALLBLADDER AND BILE DUCTS: Gallbladder is unremarkable. No biliary ductal dilatation. SPLEEN: No acute abnormality. PANCREAS: No acute abnormality. ADRENAL GLANDS: No acute abnormality. KIDNEYS, URETERS AND BLADDER: Left kidney lesion consistent with cyst measuring 4.8 cm. Possible mass arising  exophytically from the inferior pole of the right kidney measuring 4.7 cm. Renal protocol CT or [name] recommended for further evaluation. No stones in the kidneys or ureters. No hydronephrosis. No perinephric or periureteral stranding. Urinary bladder is unremarkable. GI AND BOWEL: Stomach demonstrates no acute abnormality. There is no bowel obstruction. PERITONEUM AND RETROPERITONEUM: Small amount of free fluid in the right paracolic gutter and in the pelvis. No free air. VASCULATURE: Atheromatous calcifications of the aorta and its branches. LYMPH NODES: No lymphadenopathy. REPRODUCTIVE ORGANS: Status post hysterectomy. BONES AND SOFT TISSUES: No acute osseous abnormality. No focal soft tissue abnormality. IMPRESSION: 1. Left lobe hepatic cystic lesion now measures 5.5 cm, decreased from 6.4 cm on the prior study, compatible with hepatic cysts; a few additional subcentimeter hepatic hypodensities likely represent cysts 2. Possible exophytic mass  arising from the inferior pole of the right kidney measuring 4.7 cm; indeterminate renal massrecommend prompt renal protocol [name] (preferred) or CT without and with contrast for characterization and urology consultation if suspicious on dedicated imaging 3. Small amount of free fluid in the right paracolic gutter and in the pelvis Electronically signed by: Fonda Field MD 08/25/2024 11:40 AM EST RP Workstation: [id number]   CT HEAD WO CONTRAST ( ) Result Date: 08/25/2024 EXAM: CT HEAD WITHOUT CONTRAST 08/25/2024 11:27:00 AM TECHNIQUE: CT of the head was performed without the administration of intravenous contrast. Automated exposure control, iterative reconstruction, and/or weight based adjustment of the mA/kV was utilized to reduce the radiation dose to as low as reasonably achievable. COMPARISON: [name] 03/22/2022. CLINICAL HISTORY: Head trauma, minor (Age >= 65y). FINDINGS: BRAIN AND VENTRICLES: No acute hemorrhage. No evidence of acute infarct. Prominence of the  sulci and ventricles compatible with brain atrophy. Hypoattenuating foci in the cerebral white matter, most likely representing chronic small vessel disease. No extra-axial collection. No mass effect or midline shift. ORBITS: No acute abnormality. SINUSES: No acute abnormality. SOFT TISSUES AND SKULL: No acute soft tissue abnormality. No skull fracture. IMPRESSION: 1. No acute intracranial abnormality. Electronically signed by: Waddell Calk MD 08/25/2024 11:34 AM EST RP Workstation: [id number]   DG Chest Port 1 View Result Date: 08/25/2024 EXAM: 1 VIEW(S) XRAY OF THE CHEST 08/25/2024 11:11:00 AM COMPARISON: 06/18/2024 CLINICAL HISTORY: Questionable sepsis - evaluate for abnormality FINDINGS: LUNGS AND PLEURA: Vascular congestion. Small right-sided pleural effusion. No focal pulmonary opacity. No pneumothorax. HEART AND MEDIASTINUM: Prominent cardiac silhouette. BONES AND SOFT TISSUES: No acute osseous abnormality. IMPRESSION: 1. Vascular congestion and small right-sided pleural effusion. 2. Prominent cardiac silhouette. Electronically signed by: Fonda Field MD 08/25/2024 11:28 AM EST RP Workstation: [id number]   Labs on Admission: I have personally reviewed following labs  CBC: Recent Labs  Lab 08/22/24 1414 08/25/24 1048  WBC 7.8 9.7  NEUTROABS 5.6 8.6*  HGB 9.9* 9.9*  HCT 29.7* 29.8*  MCV 94.6 92.3  PLT 175 139*   Basic Metabolic Panel: Recent Labs  Lab 08/22/24 1415 08/25/24 1154  NA 142 137  K 3.3* 3.3*  CL 110 105  CO2 23 20*  GLUCOSE 116* 114*  BUN 26* 31*  CREATININE 1.83* 2.36*  CALCIUM  9.5 8.6*   GFR: Estimated Creatinine Clearance: 15.2 mL/min (A) (by C-G formula based on SCr of 2.36 mg/dL (H)).  Liver Function Tests: Recent Labs  Lab 08/22/24 1415 08/25/24 1154  AST 18 19  ALT 11 9  ALKPHOS 50 61  BILITOT 0.6 0.8  PROT 7.6 5.8*  ALBUMIN 4.2 3.1*   Coagulation Profile: Recent Labs  Lab 08/25/24 1048  INR 1.3*   BNP (last 3 results) Recent Labs     06/18/24 1049  PROBNP 305.0*    Anemia Panel: No results for input(s): VITAMINB12, FOLATE, FERRITIN, TIBC, IRON, RETICCTPCT in the last 72 hours.  Urine analysis:    Component Value Date/Time   COLORURINE AMBER (A) 08/25/2024 1103   APPEARANCEUR CLOUDY (A) 08/25/2024 1103   APPEARANCEUR Cloudy 08/19/2012 0208   LABSPEC 1.011 08/25/2024 1103   LABSPEC 1.009 08/19/2012 0208   PHURINE 5.0 08/25/2024 1103   GLUCOSEU NEGATIVE 08/25/2024 1103   GLUCOSEU Negative 08/19/2012 0208   HGBUR LARGE (A) 08/25/2024 1103   BILIRUBINUR NEGATIVE 08/25/2024 1103   BILIRUBINUR neg 01/21/2022 1130   BILIRUBINUR Negative 08/19/2012 0208   KETONESUR NEGATIVE 08/25/2024 1103   PROTEINUR >=300 (A) 08/25/2024 1103   UROBILINOGEN 1.0 01/21/2022 1130  UROBILINOGEN 0.2 01/08/2011 1553   NITRITE NEGATIVE 08/25/2024 1103   LEUKOCYTESUR MODERATE (A) 08/25/2024 1103   LEUKOCYTESUR 2+ 08/19/2012 0208   This document was prepared using Dragon Voice Recognition software and may include unintentional dictation errors.  Dr. Sherre [facility]  If 7PM-7AM, please contact overnight-coverage provider If 7AM-7PM, please contact day attending provider [email or web address]  08/25/2024, 3:50 PM

## 2024-08-26 DIAGNOSIS — N390 Urinary tract infection, site not specified: Secondary | ICD-10-CM | POA: Diagnosis not present

## 2024-08-26 LAB — CBC
HCT: 24.1 % — ABNORMAL LOW (ref 36.0–46.0)
Hemoglobin: 8.2 g/dL — ABNORMAL LOW (ref 12.0–15.0)
MCH: 31.2 pg (ref 26.0–34.0)
MCHC: 34 g/dL (ref 30.0–36.0)
MCV: 91.6 fL (ref 80.0–100.0)
Platelets: 108 K/uL — ABNORMAL LOW (ref 150–400)
RBC: 2.63 MIL/uL — ABNORMAL LOW (ref 3.87–5.11)
RDW: 13.5 % (ref 11.5–15.5)
WBC: 7.9 K/uL (ref 4.0–10.5)
nRBC: 0 % (ref 0.0–0.2)

## 2024-08-26 LAB — BASIC METABOLIC PANEL WITH GFR
Anion gap: 9 (ref 5–15)
BUN: 36 mg/dL — ABNORMAL HIGH (ref 8–23)
CO2: 21 mmol/L — ABNORMAL LOW (ref 22–32)
Calcium: 8.3 mg/dL — ABNORMAL LOW (ref 8.9–10.3)
Chloride: 106 mmol/L (ref 98–111)
Creatinine, Ser: 2.4 mg/dL — ABNORMAL HIGH (ref 0.44–1.00)
GFR, Estimated: 20 mL/min — ABNORMAL LOW (ref >60)
Glucose, Bld: 108 mg/dL — ABNORMAL HIGH (ref 70–99)
Potassium: 3.4 mmol/L — ABNORMAL LOW (ref 3.5–5.1)
Sodium: 137 mmol/L (ref 135–145)

## 2024-08-26 MED ORDER — LATANOPROST 0.005 % OP SOLN
1.0000 [drp] | Freq: Every day | OPHTHALMIC | Status: DC
Start: 2024-08-26 — End: 2024-09-01
  Administered 2024-08-26 – 2024-08-31 (×4): 1 [drp] via OPHTHALMIC
  Filled 2024-08-26: qty 2.5

## 2024-08-26 MED ORDER — BRINZOLAMIDE 1 % OP SUSP
1.0000 [drp] | Freq: Every day | OPHTHALMIC | Status: DC
  Administered 2024-08-26 – 2024-08-31 (×4): 1 [drp] via OPHTHALMIC
  Filled 2024-08-26: qty 10

## 2024-08-26 MED ORDER — SUCRALFATE 1 G PO TABS
1.0000 g | ORAL_TABLET | Freq: Three times a day (TID) | ORAL | Status: DC
  Administered 2024-08-26 – 2024-09-01 (×21): 1 g via ORAL
  Filled 2024-08-26 (×22): qty 1

## 2024-08-26 MED ORDER — DOXAZOSIN MESYLATE 1 MG PO TABS
1.0000 mg | ORAL_TABLET | Freq: Every day | ORAL | Status: DC
  Administered 2024-08-26 – 2024-08-29 (×4): 1 mg via ORAL
  Filled 2024-08-26 (×5): qty 1

## 2024-08-26 MED ORDER — BRIMONIDINE TARTRATE 0.2 % OP SOLN
1.0000 [drp] | Freq: Every day | OPHTHALMIC | Status: DC
  Administered 2024-08-26 – 2024-08-31 (×5): 1 [drp] via OPHTHALMIC
  Filled 2024-08-26: qty 5

## 2024-08-26 MED ORDER — AMIODARONE HCL 200 MG PO TABS
100.0000 mg | ORAL_TABLET | Freq: Every day | ORAL | Status: DC
  Administered 2024-08-26 – 2024-09-01 (×7): 100 mg via ORAL
  Filled 2024-08-26 (×7): qty 1

## 2024-08-26 NOTE — Evaluation (Signed)
 Occupational Therapy Evaluation Patient Details Name: [name] MRN: [id number] DOB: April 02, 1941 Today's Date: 08/26/2024   History of Present Illness   Pt is a 83 year old female who presents ED for chief concerns of altered mental status and fever. Current MD assessment: complicated UTI. PMH of dementia, atrial fibrillation on Eliquis , CKD stage IV, rheumatoid arthritis, hypertension, hyperlipidemia, history of left hepatic lobe cyst.     Clinical Impressions Pt was seen for OT evaluation this date. PTA, pt resides in a one level home with 5 STE with her husband. Pt is a poor historian, daughter present to assist with PLOF. At baseline, pt ambulates without AD within the home and is IND with ADL performance. Spouse manages most household chores and assists with pt's meds. Pt presents with deficits in strength, balance, and safety awareness d/t AMS, affecting safe and optimal ADL completion. Pt currently requires CGA for STS, toilet transfers, SPT and ambulation within the room. Initially utilized RW, then pt able to ambulate back from bathroom to bed without AD with CGA. Min A required for LB dressing/bathing and SBA with cues for thoroughness and initiation for UB ADLs. Pt left seated in recliner with all needs in place, daughter present. Pt would benefit from skilled OT services to address noted impairments and functional limitations to maximize safety and independence while minimizing future risk of falls, injury, and readmission. Do anticipate the need for follow up OT services upon acute hospital DC.      If plan is discharge home, recommend the following:   A little help with walking and/or transfers;A little help with bathing/dressing/bathroom;Help with stairs or ramp for entrance;Assist for transportation;Supervision due to cognitive status;Direct supervision/assist for medications management;Direct supervision/assist for financial management     Functional Status  Assessment   Patient has had a recent decline in their functional status and demonstrates the ability to make significant improvements in function in a reasonable and predictable amount of time.     Equipment Recommendations   BSC/3in1;Other (comment) (RW)     Recommendations for Other Services         Precautions/Restrictions   Precautions Precautions: Fall Recall of Precautions/Restrictions: Impaired Restrictions Weight Bearing Restrictions Per Provider Order: No     Mobility Bed Mobility               General bed mobility comments: seated EOB on OT entry with daughter present    Transfers Overall transfer level: Needs assistance Equipment used: None Transfers: Sit to/from Stand, Bed to chair/wheelchair/BSC Sit to Stand: Contact guard assist     Step pivot transfers: Contact guard assist     General transfer comment: able to perform transfers and mobility using RW and without RW with CGA, cues for hand placement and intiiation      Balance Overall balance assessment: Needs assistance Sitting-balance support: Feet supported Sitting balance-Leahy Scale: Good     Standing balance support: Single extremity supported, No upper extremity supported, During functional activity Standing balance-Leahy Scale: Fair Standing balance comment: CGA for safety                           ADL either performed or assessed with clinical judgement   ADL Overall ADL's : Needs assistance/impaired     Grooming: Wash/dry face;Set up;Standing;Applying deodorant;Sitting Grooming Details (indicate cue type and reason): cues for initation of tasks and maintaining attention Upper Body Bathing: Sitting;Set up Upper Body Bathing Details (indicate cue type and reason): increased time  and cues to bathe under arms Lower Body Bathing: Contact guard assist;Sit to/from stand;Minimal assistance Lower Body Bathing Details (indicate cue type and reason): standing at RW with  Min A for thoroughness Upper Body Dressing : Minimal assistance Upper Body Dressing Details (indicate cue type and reason): to manage gown and take shirt off over IV Lower Body Dressing: Minimal assistance;Sitting/lateral leans;Sit to/from stand Lower Body Dressing Details (indicate cue type and reason): to donn mesh underwear over feet Toilet Transfer: Contact guard assist;BSC/3in1 Toilet Transfer Details (indicate cue type and reason): sat on BSC for bathing tasks, CGA to stand from [facility] with cues for hand placement         Functional mobility during ADLs: Contact guard assist;Rolling walker (2 wheels) General ADL Comments: ambulated to bathroom using RW with CGA and ambulated back without AD with CGA     Vision         Perception         Praxis         Pertinent Vitals/Pain Pain Assessment Pain Assessment: No/denies pain     Extremity/Trunk Assessment Upper Extremity Assessment Upper Extremity Assessment: Overall WFL for tasks assessed   Lower Extremity Assessment Lower Extremity Assessment: Generalized weakness       Communication Communication Communication: Impaired Factors Affecting Communication: Hearing impaired   Cognition Arousal: Alert Behavior During Therapy: WFL for tasks assessed/performed Cognition: Cognition impaired       Memory impairment (select all impairments): Short-term memory   Executive functioning impairment (select all impairments): Initiation, Problem solving                   Following commands: Impaired Following commands impaired: Follows one step commands with increased time     Cueing  General Comments   Cueing Techniques: Verbal cues;Tactile cues      Exercises Other Exercises Other Exercises: Edu on role of OT in acute setting, DC recommendations and DME/AE/AD   Shoulder Instructions      Home Living Family/patient expects to be discharged to:: Private residence Living Arrangements: Spouse/significant  other Available Help at Discharge: Family;Available 24 hours/day;Available PRN/intermittently Type of Home: House Home Access: Stairs to enter [employer] of Steps: 5   Home Layout: One level     Bathroom Shower/Tub: Producer, Television/film/video: Standard     Home Equipment: Shower seat - built in          Prior Functioning/Environment Prior Level of Function : Needs assist       Physical Assist : ADLs (physical)   ADLs (physical): IADLs Mobility Comments: household ambulator with no AD ADLs Comments: ind with ADLs, spouse manages most IADLs, pt's daughter or spouse drive her    OT Problem List: Decreased strength;Impaired balance (sitting and/or standing);Decreased safety awareness;Decreased cognition   OT Treatment/Interventions: Self-care/ADL training;Therapeutic exercise;Therapeutic activities;DME and/or AE instruction;Patient/family education;Balance training      OT Goals(Current goals can be found in the care plan section)   Acute Rehab OT Goals Patient Stated Goal: get better OT Goal Formulation: With patient/family Time For Goal Achievement: 09/09/24 Potential to Achieve Goals: Good ADL Goals Pt Will Perform Grooming: with modified independence;standing Pt Will Perform Lower Body Dressing: with modified independence;sitting/lateral leans;sit to/from stand;with supervision Pt Will Transfer to Toilet: with modified independence;with supervision;ambulating   OT Frequency:  Min 2X/week    Co-evaluation              AM-PAC OT 6 Clicks Daily Activity     Outcome  Measure Help from another person eating meals?: None Help from another person taking care of personal grooming?: A Little Help from another person toileting, which includes using toliet, bedpan, or urinal?: A Little Help from another person bathing (including washing, rinsing, drying)?: A Little Help from another person to put on and taking off regular upper body clothing?:  None Help from another person to put on and taking off regular lower body clothing?: A Little 6 Click Score: 20   End of Session Equipment Utilized During Treatment: Rolling walker (2 wheels) Nurse Communication: Mobility status  Activity Tolerance: Patient tolerated treatment well Patient left: in chair;with call bell/phone within reach;with chair alarm set;with family/visitor present  OT Visit Diagnosis: Muscle weakness (generalized) (M62.81);Other abnormalities of gait and mobility (R26.89)                Time: 9243-9163 OT Time Calculation (min): 40 min Charges:  OT General Charges $OT Visit: 1 Visit OT Evaluation $OT Eval Moderate Complexity: 1 Mod OT Treatments $Self Care/Home Management : 23-37 mins Kristin Barcus, OTR/L 08/26/24, 9:49 AM  Chantil Bari E Lesha Jager 08/26/2024, 9:46 AM

## 2024-08-26 NOTE — Telephone Encounter (Signed)
 I called patient about her labs. Patients daughter answered, however she is not on the HIPAA form. She informed me that her mother is in the hospital now. I told her that I would call back at another time and let the doctor know about the patients hospitalization.

## 2024-08-26 NOTE — Progress Notes (Signed)
 Dr Marsa and RN made aware that per tele monitoring, 13 beat run of SVT HR up to 157, now NSR HR 63

## 2024-08-26 NOTE — Evaluation (Signed)
 Physical Therapy Evaluation Patient Details Name: [name] MRN: [id number] DOB: [birth year] Today's Date: 08/26/2024  History of Present Illness  Pt is a 83 year old female who presents ED for chief concerns of altered mental status and fever. Current MD assessment: complicated UTI. PMH of dementia, atrial fibrillation on Eliquis , CKD stage IV, rheumatoid arthritis, hypertension, hyperlipidemia, history of left hepatic lobe cyst.   Clinical Impression  Pt A&Ox3, disoriented to time, pleasant and agreeable to participate in PT evaluation. At baseline, pt reports being IND with mobility/ADLs, denies hx of falls, cites recent reliance on husband/family for external support when ambulating. Pt was met sitting in recliner, able to perform STS transfer with CGA-minA with increased assistance to power up to standing, VC for hand placement throughout transfer. Pt amb ~15ft with RW and CGA, no LOB throughout but displayed very short, shuffled gait pattern with minimal dorsiflexion bilaterally. Pt and daughter educated on and receptive to use of RW for home ambulation for max safety and minimize falls risk. HR and SpO2 WFL during session with pt on RA. Pt was left sitting in recliner at end of session with all needs in reach. Pt would benefit from skilled PT intervention to address listed deficits (see PT Problem List) and allow for safe return to PLOF.       If plan is discharge home, recommend the following: A little help with walking and/or transfers;A little help with bathing/dressing/bathroom;Assistance with cooking/housework;Direct supervision/assist for medications management;Direct supervision/assist for financial management;Assist for transportation;Help with stairs or ramp for entrance   Can travel by private vehicle        Equipment Recommendations Rolling walker (2 wheels);BSC/3in1  Recommendations for Other Services       Functional Status Assessment Patient has had a recent  decline in their functional status and demonstrates the ability to make significant improvements in function in a reasonable and predictable amount of time.     Precautions / Restrictions Precautions Precautions: Fall Recall of Precautions/Restrictions: Impaired Restrictions Weight Bearing Restrictions Per Provider Order: No      Mobility  Bed Mobility               General bed mobility comments: NT, pt sitting in recliner at start/end of session    Transfers Overall transfer level: Needs assistance Equipment used: Rolling walker (2 wheels) Transfers: Sit to/from Stand Sit to Stand: Min assist, Contact guard assist           General transfer comment: pt required minA to power up to standing from recliner, CGA for stand > sit with VC for hand placement throughout    Ambulation/Gait Ambulation/Gait assistance: Contact guard assist Gait Distance (Feet): 100 Feet Assistive device: Rolling walker (2 wheels) Gait Pattern/deviations: Step-through pattern, Decreased stride length, Shuffle, Narrow base of support Gait velocity: decreased     General Gait Details: Pt amb with RW and CGA, noted to have short, shuffled gait pattern throughout with minimal dorsiflexion bilaterally. Instances of R toes catching on ground in swing, no LOB throughout.  Stairs            Wheelchair Mobility     Tilt Bed    Modified Rankin (Stroke Patients Only)       Balance Overall balance assessment: Needs assistance Sitting-balance support: Feet supported Sitting balance-Leahy Scale: Good Sitting balance - Comments: steady static and dynamic sitting   Standing balance support: Bilateral upper extremity supported Standing balance-Leahy Scale: Fair Standing balance comment: more steady with external support  Pertinent Vitals/Pain Pain Assessment Pain Assessment: No/denies pain    Home Living Family/patient expects to be discharged  to:: Private residence Living Arrangements: Spouse/significant other Available Help at Discharge: Family;Available 24 hours/day Type of Home: House Home Access: Stairs to enter Entrance Stairs-Rails: Right;Left;Can reach both Entrance Stairs-Number of Steps: 5   Home Layout: One level Home Equipment: Shower seat - built in      Prior Function Prior Level of Function : Needs assist       Physical Assist : ADLs (physical)   ADLs (physical): IADLs Mobility Comments: Pt reports being a household ambulator with no AD< has been more reliant on husband/family for external support recently, denies hx of falls ADLs Comments: IND with ADLs, spouse of daughter drives     Extremity/Trunk Assessment   Upper Extremity Assessment Upper Extremity Assessment: Defer to OT evaluation    Lower Extremity Assessment Lower Extremity Assessment: Generalized weakness       Communication   Communication Communication: Impaired Factors Affecting Communication: Hearing impaired    Cognition Arousal: Alert Behavior During Therapy: WFL for tasks assessed/performed   PT - Cognitive impairments: History of cognitive impairments                       PT - Cognition Comments: Pt A&Ox3, hx of dementia at baseline but pleasant and able to follow simple commands consistently Following commands: Impaired Following commands impaired: Follows one step commands with increased time     Cueing Cueing Techniques: Verbal cues, Visual cues     General Comments      Exercises Other Exercises Other Exercises: HR and SpO2 assessed after ambulation with pt on RA: SPO2 96% HR 87bpm   Assessment/Plan    PT Assessment Patient needs continued PT services  PT Problem List Decreased strength;Decreased activity tolerance;Decreased balance;Decreased mobility;Decreased cognition;Decreased knowledge of use of DME       PT Treatment Interventions DME instruction;Gait training;Stair training;Functional  mobility training;Therapeutic activities;Therapeutic exercise;Balance training;Cognitive remediation;Patient/family education    PT Goals (Current goals can be found in the Care Plan section)  Acute Rehab PT Goals Patient Stated Goal: to go home PT Goal Formulation: With patient Time For Goal Achievement: 09/09/24 Potential to Achieve Goals: Fair    Frequency Min 2X/week     Co-evaluation               AM-PAC PT 6 Clicks Mobility  Outcome Measure Help needed turning from your back to your side while in a flat bed without using bedrails?: A Little Help needed moving from lying on your back to sitting on the side of a flat bed without using bedrails?: A Little Help needed moving to and from a bed to a chair (including a wheelchair)?: A Little Help needed standing up from a chair using your arms (e.g., wheelchair or bedside chair)?: A Little Help needed to walk in hospital room?: A Little Help needed climbing 3-5 steps with a railing? : A Little 6 Click Score: 18    End of Session Equipment Utilized During Treatment: Gait belt Activity Tolerance: Patient tolerated treatment well Patient left: in chair;with call bell/phone within reach;with chair alarm set;with family/visitor present Nurse Communication: Mobility status PT Visit Diagnosis: Other abnormalities of gait and mobility (R26.89);Muscle weakness (generalized) (M62.81)    Time: 9088-9068 PT Time Calculation (min) (ACUTE ONLY): 20 min   Charges:   PT Evaluation $PT Eval Moderate Complexity: 1 Mod   PT General Charges $$ ACUTE PT VISIT: 1  Visit        Reno Clasby, SPT

## 2024-08-26 NOTE — TOC Initial Note (Signed)
 Transition of Care [facility]) - Initial/Assessment Note    Patient Details  Name: [name] MRN: [id number] Date of Birth: September 04, 1941  Transition of Care [facility]) CM/SW Contact:    [name], RN Phone Number: 08/26/2024, 6:29 PM  Clinical Narrative:  RNCM met with patient and daughter at bedside. RNCM introduced role and explained that discharge planning would be discussed. PT is recommending HH/PT. Pt and daugher in agreement with HH/PT. Daughter has concerns that her mom also need RN for medication management and Aide to assist with personal hygiene. MD made aware. Pt and daughter not familiar with the [facility] Agencies in the area so asked that I submit referrals for them and the RNCM will review with patient and daughter and make a decision on who they would like to use. Referrals submitted. Pt lives at home with her husband and he will be picking her up at the time of discharge.  Pt doesn't have any DME at home.   RNCM will continue to follow for discharge planning needs.     Expected Discharge Plan: Home w Home Health Services Barriers to Discharge: Continued Medical Work up   Patient Goals and CMS Choice            Expected Discharge Plan and Services       Living arrangements for the past 2 months: Single Family Home                           HH Arranged: PT HH Agency: Other - See comment (Referrals submitted via EPIC) Date HH Agency Contacted: 08/26/24   Representative spoke with at [facility] Agency: Referrals submitted via EPIC  Prior Living Arrangements/Services Living arrangements for the past 2 months: Single Family Home Lives with:: Self, Spouse Patient language and need for interpreter reviewed:: Yes Do you feel safe going back to the place where you live?: Yes      Need for Family Participation in Patient Care: Yes (Comment)     Criminal Activity/Legal Involvement Pertinent to Current Situation/Hospitalization: No - Comment as needed  Activities of Daily  Living   ADL Screening (condition at time of admission) Independently performs ADLs?: Yes (appropriate for developmental age) Is the patient deaf or have difficulty hearing?: No Does the patient have difficulty seeing, even when wearing glasses/contacts?: No Does the patient have difficulty concentrating, remembering, or making decisions?: Yes  Permission Sought/Granted                  Emotional Assessment Appearance:: Appears stated age, Well-Groomed Attitude/Demeanor/Rapport: Engaged, Gracious Affect (typically observed): Calm, Accepting, Quiet, Pleasant Orientation: : Oriented to Self, Oriented to Place, Oriented to  Time, Oriented to Situation Alcohol / Substance Use: Not Applicable Psych Involvement: No (comment)  Admission diagnosis:  Acute cystitis without hematuria [N30.00] Complicated UTI (urinary tract infection) [N39.0] Sepsis, due to unspecified organism, unspecified whether acute organ dysfunction present [facility]) [A41.9] Patient Active Problem List   Diagnosis Date Noted   Complicated UTI (urinary tract infection) 08/25/2024   Thrombocytopenia 06/19/2024   Degenerative lumbar spinal stenosis 02/27/2024   Lumbar radiculopathy 02/05/2024   CKD (chronic kidney disease) stage 4, GFR 15-29 ml/min (HCC) 08/17/2023   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   Adenomatous polyp of transverse colon 02/28/2023   Colon cancer screening 02/28/2023   Acute kidney injury superimposed on chronic kidney disease 03/22/2022   Chronic nausea 03/03/2022   Carotid artery calcification, bilateral 12/21/2021   Osteoarthritis of cervical spine 12/21/2021  Chronic diastolic CHF (congestive heart failure) (HCC) 08/17/2021   Adnexal mass 08/04/2021   Anemia of chronic renal failure, stage 3b (HCC) 08/03/2021   GERD (gastroesophageal reflux disease) 07/26/2021   Left hip pain 04/20/2021   Moderate dementia (HCC) 11/10/2020   Sinus bradycardia 07/18/2019   Valvular heart disease 07/18/2019   PSVT  (paroxysmal supraventricular tachycardia) 05/16/2019   Persistent atrial fibrillation (HCC) 07/19/2018   Peripheral edema 02/02/2018   Coronary artery disease, non-occlusive 09/14/2017   Status post ablation of atrial fibrillation 09/14/2017   Essential hypertension 09/14/2017   Tinea pedis 07/14/2017   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/15/2016   History of rheumatoid arthritis 03/14/2016   Mitral regurgitation 12/30/2015   Aortic valve insufficiency 12/02/2015   Vitamin D  deficiency 04/17/2015   Allergic rhinitis 02/03/2015   Acute left-sided low back pain with left-sided sciatica 08/28/2012   Left groin pain 05/01/2012   Aortic valve sclerosis 08/19/2010   Osteopenia 05/11/2010   SINUS BRADYCARDIA 08/19/2009   Anemia of chronic disease 12/13/2007   HYPERLIPIDEMIA 08/23/2007   UNSPECIFIED GLAUCOMA 10/10/2006   PCP:  Avelina Greig BRAVO, MD Pharmacy:   [facility] - [place], [place] - [address] [place] [address] Phone: [phone] Fax: [phone]  [facility] [address] [place] [address] Phone: [phone] Fax: [phone]     Social Drivers of Health (SDOH) Social History: SDOH Screenings   Food Insecurity: No Food Insecurity (08/25/2024)  Housing: Low Risk  (08/25/2024)  Transportation Needs: No Transportation Needs (08/25/2024)  Utilities: Not At Risk (08/25/2024)  Alcohol Screen: Low Risk  (07/11/2024)  Depression (PHQ2-9): Low Risk  (07/18/2024)  Financial Resource Strain: Low Risk  (07/11/2024)  Physical Activity: Inactive (07/11/2024)  Social Connections: Socially Integrated (08/25/2024)  Stress: No Stress Concern Present (07/11/2024)  Tobacco Use: Low Risk  (08/25/2024)  Health Literacy: Inadequate Health Literacy (07/11/2024)   SDOH Interventions:     Readmission Risk Interventions     No data to display

## 2024-08-26 NOTE — Plan of Care (Signed)
   Problem: Fluid Volume: Goal: Hemodynamic stability will improve Outcome: Progressing

## 2024-08-26 NOTE — Progress Notes (Signed)
 PROGRESS NOTE    [name]   FMW:[id number] DOB: [birth year]  DOA: 08/25/2024 Date of Service: 08/26/24 which is hospital day 1  PCP: Jacqueline Greig BRAVO, MD    Hospital course / significant events:   [name] is a 83 year old female with history of dementia, atrial fibrillation on Eliquis , CKD stage IV, rheumatoid arthritis, hypertension, hyperlipidemia, history of left hepatic lobe cyst, who presents ED for chief concerns of altered mental status and fever.  11/16: to ED. Tmax 102. Encephalopathic. Broad abx cefepime + vanc + flagyl. Admitted to hospitalist 11/17: (+)Ecoli bacteremia, abx to ceftriaxone . Pt mental status improved/baseline. Continue abx await susceptibilities      Consultants:  none  Procedures/Surgeries: none      ASSESSMENT & PLAN:   Complicated UTI (urinary tract infection) (+)Ecoli bacteremia Sepsis ruled out  Metabolic encephalopathy d/t UTI in elderly patient - resolved  Blood culture x 2 --> (+)Ecoli, susceptibilities pending  urine culture (+)Ecoli, susceptibilities pending  dc cefepime, vancomycin, metronidazole --> started ceftriaxone  today  Maintain MAP greater than 65 Status post 2 L LR bolus per EDP, can dc fluids now that alert and taking po    CKD (chronic kidney disease) stage 4, GFR 15-29 ml/min  At baseline Monitor BMP   GERD (gastroesophageal reflux disease) PPI   Essential hypertension Resume home cardura  lower dose titrate as needed Holding home lasix , losartan , hydralazine  for now  Hydralazine  5 mg IV every 6 hours as needed for SBP greater 165, 5 days ordered   HYPERLIPIDEMIA Rosuvastatin  10 mg nightly resume   Atrial fibrillation Eliquis  Amiodarone , uncertain why taking this 6 of 7 days per week but ok for 100 mg daily per last cardio note      No concerns based on BMI: Body mass index is 23.28 kg/m.SABRA Significantly low or high BMI is associated with higher medical risk.  Underweight - under 18   overweight - 25 to 29 obese - 30 or more Class 1 obesity: BMI of 30.0 to 34 Class 2 obesity: BMI of 35.0 to 39 Class 3 obesity: BMI of 40.0 to 49 Super Morbid Obesity: BMI 50-59 Super-super Morbid Obesity: BMI 60+ Healthy nutrition and physical activity advised as adjunct to other disease management and risk reduction treatments    DVT prophylaxis: Eliquis  IV fluids: no continuous IV fluids  Nutrition: regular Central lines / other devices: none  Code Status: FULL CODE ACP documentation reviewed:  none on file in [facility]  TOC needs: TBD Medical barriers to dispo: IV abx, pend cultures. Expected medical readiness for discharge 1-2 days.              Subjective / Brief ROS:  Patient reports feeling better today Denies CP/SOB.  Pain controlled.  Denies new weakness.  Tolerating diet, ate most of breakfast.  Reports no concerns w/ urination/defecation.   Family Communication: pt's husband and pt's daughter at bedside on rounds     Objective Findings:  Vitals:   08/25/24 2359 08/26/24 0534 08/26/24 0838 08/26/24 1239  BP: (!) 111/57 (!) 133/110 (!) 121/57 (!) 141/61  Pulse: (!) 58 62 67 63  Resp: 18 19 18 18   Temp: 98.5 F (36.9 C) 100.2 F (37.9 C) 98.8 F (37.1 C) 99.3 F (37.4 C)  TempSrc: Oral Oral Oral Oral  SpO2: 98% 99% 98% 100%  Weight:      Height:        Intake/Output Summary (Last 24 hours) at 08/26/2024 1352 Last data filed at 08/26/2024 0900  Gross per 24 hour  Intake 1969.01 ml  Output --  Net 1969.01 ml   Filed Weights   08/25/24 1047  Weight: 59.6 kg    Examination:  Physical Exam Constitutional:      General: She is not in acute distress. Cardiovascular:     Rate and Rhythm: Normal rate and regular rhythm.     Heart sounds: Murmur heard.  Pulmonary:     Effort: Pulmonary effort is normal.     Breath sounds: Normal breath sounds.  Abdominal:     Palpations: Abdomen is soft.  Musculoskeletal:     Right lower leg: No  edema.     Left lower leg: No edema.  Skin:    General: Skin is warm and dry.  Neurological:     Mental Status: She is alert and oriented to person, place, and time. Mental status is at baseline.  Psychiatric:        Mood and Affect: Mood normal.        Behavior: Behavior normal.          Scheduled Medications:   amiodarone   100 mg Oral Daily   apixaban   2.5 mg Oral BID   brinzolamide  1 drop Both Eyes QHS   And   brimonidine  1 drop Both Eyes QHS   doxazosin   1 mg Oral Daily   latanoprost   1 drop Both Eyes QHS   pantoprazole   40 mg Oral Daily   rosuvastatin   10 mg Oral QHS   sucralfate   1 g Oral TID WC & HS    Continuous Infusions:  cefTRIAXone  (ROCEPHIN )  IV 2 g (08/26/24 0848)    PRN Medications:  acetaminophen  **OR** acetaminophen , hydrALAZINE , ondansetron  **OR** ondansetron  (ZOFRAN ) IV, senna-docusate  Antimicrobials from admission:  Anti-infectives (From admission, onward)    Start     Dose/Rate Route Frequency Ordered Stop   08/26/24 1100  ceFEPIme (MAXIPIME) 2 g in sodium chloride  0.9 % 100 mL IVPB  Status:  Discontinued        2 g 200 mL/hr over 30 Minutes Intravenous Every 24 hours 08/25/24 1328 08/25/24 2338   08/26/24 1000  cefTRIAXone  (ROCEPHIN ) 2 g in sodium chloride  0.9 % 100 mL IVPB        2 g 200 mL/hr over 30 Minutes Intravenous Every 24 hours 08/25/24 2337     08/25/24 2200  metroNIDAZOLE (FLAGYL) IVPB 500 mg  Status:  Discontinued        500 mg 100 mL/hr over 60 Minutes Intravenous Every 12 hours 08/25/24 1323 08/25/24 2338   08/25/24 1341  vancomycin variable dose per unstable renal function (pharmacist dosing)  Status:  Discontinued         Does not apply See admin instructions 08/25/24 1345 08/25/24 2338   08/25/24 1100  ceFEPIme (MAXIPIME) 2 g in sodium chloride  0.9 % 100 mL IVPB        2 g 200 mL/hr over 30 Minutes Intravenous  Once 08/25/24 1048 08/25/24 1155   08/25/24 1100  metroNIDAZOLE (FLAGYL) IVPB 500 mg        500 mg 100 mL/hr  over 60 Minutes Intravenous  Once 08/25/24 1048 08/25/24 1209   08/25/24 1100  vancomycin (VANCOCIN) IVPB 1000 mg/200 mL premix        1,000 mg 200 mL/hr over 60 Minutes Intravenous  Once 08/25/24 1048 08/25/24 1221           Data Reviewed:  I have personally reviewed the following...  CBC: Recent  Labs  Lab 08/22/24 1414 08/25/24 1048 08/26/24 0431  WBC 7.8 9.7 7.9  NEUTROABS 5.6 8.6*  --   HGB 9.9* 9.9* 8.2*  HCT 29.7* 29.8* 24.1*  MCV 94.6 92.3 91.6  PLT 175 139* 108*   Basic Metabolic Panel: Recent Labs  Lab 08/22/24 1415 08/25/24 1154 08/26/24 0431  NA 142 137 137  K 3.3* 3.3* 3.4*  CL 110 105 106  CO2 23 20* 21*  GLUCOSE 116* 114* 108*  BUN 26* 31* 36*  CREATININE 1.83* 2.36* 2.40*  CALCIUM  9.5 8.6* 8.3*   GFR: Estimated Creatinine Clearance: 14.9 mL/min (A) (by C-G formula based on SCr of 2.4 mg/dL (H)). Liver Function Tests: Recent Labs  Lab 08/22/24 1415 08/25/24 1154  AST 18 19  ALT 11 9  ALKPHOS 50 61  BILITOT 0.6 0.8  PROT 7.6 5.8*  ALBUMIN 4.2 3.1*   No results for input(s): LIPASE, AMYLASE in the last 168 hours. No results for input(s): AMMONIA in the last 168 hours. Coagulation Profile: Recent Labs  Lab 08/25/24 1048  INR 1.3*   Cardiac Enzymes: Recent Labs  Lab 08/25/24 1530  CKTOTAL 135   BNP (last 3 results) Recent Labs    06/18/24 1049  PROBNP 305.0*   HbA1C: No results for input(s): HGBA1C in the last 72 hours. CBG: No results for input(s): GLUCAP in the last 168 hours. Lipid Profile: No results for input(s): CHOL, HDL, LDLCALC, TRIG, CHOLHDL, LDLDIRECT in the last 72 hours. Thyroid  Function Tests: No results for input(s): TSH, T4TOTAL, FREET4, T3FREE, THYROIDAB in the last 72 hours. Anemia Panel: No results for input(s): VITAMINB12, FOLATE, FERRITIN, TIBC, IRON, RETICCTPCT in the last 72 hours. Most Recent Urinalysis On File:     Component Value Date/Time    COLORURINE AMBER (A) 08/25/2024 1103   APPEARANCEUR CLOUDY (A) 08/25/2024 1103   APPEARANCEUR Cloudy 08/19/2012 0208   LABSPEC 1.011 08/25/2024 1103   LABSPEC 1.009 08/19/2012 0208   PHURINE 5.0 08/25/2024 1103   GLUCOSEU NEGATIVE 08/25/2024 1103   GLUCOSEU Negative 08/19/2012 0208   HGBUR LARGE (A) 08/25/2024 1103   BILIRUBINUR NEGATIVE 08/25/2024 1103   BILIRUBINUR neg 01/21/2022 1130   BILIRUBINUR Negative 08/19/2012 0208   KETONESUR NEGATIVE 08/25/2024 1103   PROTEINUR >=300 (A) 08/25/2024 1103   UROBILINOGEN 1.0 01/21/2022 1130   UROBILINOGEN 0.2 01/08/2011 1553   NITRITE NEGATIVE 08/25/2024 1103   LEUKOCYTESUR MODERATE (A) 08/25/2024 1103   LEUKOCYTESUR 2+ 08/19/2012 0208   Sepsis Labs: @LABRCNTIP (procalcitonin:4,lacticidven:4) Microbiology: Recent Results (from the past 240 hours)  Resp panel by RT-PCR (RSV, Flu A&B, Covid) Anterior Nasal Swab     Status: None   Collection Time: 08/25/24 10:48 AM   Specimen: Anterior Nasal Swab  Result Value Ref Range Status   SARS Coronavirus 2 by RT PCR NEGATIVE NEGATIVE Final    Comment: (NOTE) SARS-CoV-2 target nucleic acids are NOT DETECTED.  The SARS-CoV-2 RNA is generally detectable in upper respiratory specimens during the acute phase of infection. The lowest concentration of SARS-CoV-2 viral copies this assay can detect is 138 copies/mL. A negative result does not preclude SARS-Cov-2 infection and should not be used as the sole basis for treatment or other patient management decisions. A negative result may occur with  improper specimen collection/handling, submission of specimen other than nasopharyngeal swab, presence of viral mutation(s) within the areas targeted by this assay, and inadequate number of viral copies(<138 copies/mL). A negative result must be combined with clinical observations, patient history, and epidemiological information. The expected result  is Negative.  Fact Sheet for Patients:   bloggercourse.com  Fact Sheet for Healthcare Providers:  seriousbroker.it  This test is no t yet approved or cleared by the [place]  FDA and  has been authorized for detection and/or diagnosis of SARS-CoV-2 by FDA under an Emergency Use Authorization (EUA). This EUA will remain  in effect (meaning this test can be used) for the duration of the COVID-19 declaration under Section 564(b)(1) of the Act, 21 U.S.C.section 360bbb-3(b)(1), unless the authorization is terminated  or revoked sooner.       Influenza A by PCR NEGATIVE NEGATIVE Final   Influenza B by PCR NEGATIVE NEGATIVE Final    Comment: (NOTE) The Xpert Xpress SARS-CoV-2/FLU/RSV plus assay is intended as an aid in the diagnosis of influenza from Nasopharyngeal swab specimens and should not be used as a sole basis for treatment. Nasal washings and aspirates are unacceptable for Xpert Xpress SARS-CoV-2/FLU/RSV testing.  Fact Sheet for Patients: bloggercourse.com  Fact Sheet for Healthcare Providers: seriousbroker.it  This test is not yet approved or cleared by the [place]  FDA and has been authorized for detection and/or diagnosis of SARS-CoV-2 by FDA under an Emergency Use Authorization (EUA). This EUA will remain in effect (meaning this test can be used) for the duration of the COVID-19 declaration under Section 564(b)(1) of the Act, 21 U.S.C. section 360bbb-3(b)(1), unless the authorization is terminated or revoked.     Resp Syncytial Virus by PCR NEGATIVE NEGATIVE Final    Comment: (NOTE) Fact Sheet for Patients: bloggercourse.com  Fact Sheet for Healthcare Providers: seriousbroker.it  This test is not yet approved or cleared by the [place]  FDA and has been authorized for detection and/or diagnosis of SARS-CoV-2 by FDA under an Emergency Use  Authorization (EUA). This EUA will remain in effect (meaning this test can be used) for the duration of the COVID-19 declaration under Section 564(b)(1) of the Act, 21 U.S.C. section 360bbb-3(b)(1), unless the authorization is terminated or revoked.  Performed at [facility], [address] [place], [place] [address]   Blood Culture (routine x 2)     Status: None (Preliminary result)   Collection Time: 08/25/24 10:48 AM   Specimen: BLOOD  Result Value Ref Range Status   Specimen Description BLOOD BLOOD RIGHT HAND  Final   Special Requests   Final    BOTTLES DRAWN AEROBIC AND ANAEROBIC Blood Culture adequate volume   Culture  Setup Time   Final    Organism ID to follow IN BOTH AEROBIC AND ANAEROBIC BOTTLES GRAM NEGATIVE RODS CRITICAL RESULT CALLED TO, READ BACK BY AND VERIFIED WITH: NATHAN BELUE 08/25/24 2300 KLW Performed at [facility] [address] [place], [place] [address]    Culture GRAM NEGATIVE RODS  Final   Report Status PENDING  Incomplete  Blood Culture ID Panel (Reflexed)     Status: Abnormal   Collection Time: 08/25/24 10:48 AM  Result Value Ref Range Status   Enterococcus faecalis NOT DETECTED NOT DETECTED Final   Enterococcus Faecium NOT DETECTED NOT DETECTED Final   Listeria monocytogenes NOT DETECTED NOT DETECTED Final   Staphylococcus species NOT DETECTED NOT DETECTED Final   Staphylococcus aureus (BCID) NOT DETECTED NOT DETECTED Final   Staphylococcus epidermidis NOT DETECTED NOT DETECTED Final   Staphylococcus lugdunensis NOT DETECTED NOT DETECTED Final   Streptococcus species NOT DETECTED NOT DETECTED Final   Streptococcus agalactiae NOT DETECTED NOT DETECTED Final   Streptococcus pneumoniae NOT DETECTED NOT DETECTED Final   Streptococcus pyogenes  NOT DETECTED NOT DETECTED Final   A.calcoaceticus-baumannii NOT DETECTED NOT DETECTED Final   Bacteroides fragilis NOT DETECTED NOT DETECTED Final   Enterobacterales DETECTED (A) NOT  DETECTED Final    Comment: Enterobacterales represent a large order of gram negative bacteria, not a single organism. CRITICAL RESULT CALLED TO, READ BACK BY AND VERIFIED WITH: NATHAN BELUE 08/25/24 2300 KLW    Enterobacter cloacae complex NOT DETECTED NOT DETECTED Final   Escherichia coli DETECTED (A) NOT DETECTED Final    Comment: CRITICAL RESULT CALLED TO, READ BACK BY AND VERIFIED WITH: NATHAN BELUE 08/25/24 2300 KLW    Klebsiella aerogenes NOT DETECTED NOT DETECTED Final   Klebsiella oxytoca NOT DETECTED NOT DETECTED Final   Klebsiella pneumoniae NOT DETECTED NOT DETECTED Final   Proteus species NOT DETECTED NOT DETECTED Final   Salmonella species NOT DETECTED NOT DETECTED Final   Serratia marcescens NOT DETECTED NOT DETECTED Final   Haemophilus influenzae NOT DETECTED NOT DETECTED Final   Neisseria meningitidis NOT DETECTED NOT DETECTED Final   Pseudomonas aeruginosa NOT DETECTED NOT DETECTED Final   Stenotrophomonas maltophilia NOT DETECTED NOT DETECTED Final   Candida albicans NOT DETECTED NOT DETECTED Final   Candida auris NOT DETECTED NOT DETECTED Final   Candida glabrata NOT DETECTED NOT DETECTED Final   Candida krusei NOT DETECTED NOT DETECTED Final   Candida parapsilosis NOT DETECTED NOT DETECTED Final   Candida tropicalis NOT DETECTED NOT DETECTED Final   Cryptococcus neoformans/gattii NOT DETECTED NOT DETECTED Final   CTX-M ESBL NOT DETECTED NOT DETECTED Final   Carbapenem resistance IMP NOT DETECTED NOT DETECTED Final   Carbapenem resistance KPC NOT DETECTED NOT DETECTED Final   Carbapenem resistance NDM NOT DETECTED NOT DETECTED Final   Carbapenem resist OXA 48 LIKE NOT DETECTED NOT DETECTED Final   Carbapenem resistance VIM NOT DETECTED NOT DETECTED Final    Comment: Performed at [facility], [address] [place], [place] [address]  Blood Culture (routine x 2)     Status: None (Preliminary result)   Collection Time: 08/25/24 11:03 AM   Specimen:  BLOOD LEFT HAND  Result Value Ref Range Status   Specimen Description BLOOD LEFT HAND  Final   Special Requests   Final    BOTTLES DRAWN AEROBIC AND ANAEROBIC Blood Culture results may not be optimal due to an inadequate volume of blood received in culture bottles   Culture  Setup Time   Final    GRAM NEGATIVE RODS IN BOTH AEROBIC AND ANAEROBIC BOTTLES CRITICAL RESULT CALLED TO, READ BACK BY AND VERIFIED WITH: NATHAN BELUE 08/25/24 2300 KLW Performed at [facility], [address] [place], [place] [address]    Culture GRAM NEGATIVE RODS  Final   Report Status PENDING  Incomplete  Urine Culture     Status: Abnormal (Preliminary result)   Collection Time: 08/25/24 11:03 AM   Specimen: Urine, Random  Result Value Ref Range Status   Specimen Description   Final    URINE, RANDOM Performed at [facility], [address], [place], [place] [address]    Special Requests   Final    NONE Reflexed from [id number] Performed at [facility], [address] [place], [place] [address]    Culture (A)  Final    >=100,000 COLONIES/mL ESCHERICHIA COLI SUSCEPTIBILITIES TO FOLLOW Performed at [facility], [address], [place], [place] [address]    Report Status PENDING  Incomplete      Radiology Studies last 3  days: CT ABDOMEN PELVIS WO CONTRAST Result Date: 08/25/2024 EXAM: CT ABDOMEN AND PELVIS WITHOUT CONTRAST 08/25/2024 11:27:00 AM TECHNIQUE: CT of the abdomen and pelvis was performed without the administration of intravenous contrast. Multiplanar reformatted images are provided for review. Automated exposure control, iterative reconstruction, and/or weight-based adjustment of the mA/kV was utilized to reduce the radiation dose to as low as reasonably achievable. COMPARISON: Prior study from 06/19/2024. CLINICAL HISTORY: Abdominal pain, acute, nonlocalized. FINDINGS: LOWER CHEST: Small bilateral pleural effusions, bibasilar subsegmental atelectasis or  minimal consolidation. LIVER: Left lobe hepatic cystic lesion now measures 5.5 cm compared to 6.4 cm on the prior study. A few additional subcentimeter hypodensities in the liver are likely cysts. GALLBLADDER AND BILE DUCTS: Gallbladder is unremarkable. No biliary ductal dilatation. SPLEEN: No acute abnormality. PANCREAS: No acute abnormality. ADRENAL GLANDS: No acute abnormality. KIDNEYS, URETERS AND BLADDER: Left kidney lesion consistent with cyst measuring 4.8 cm. Possible mass arising exophytically from the inferior pole of the right kidney measuring 4.7 cm. Renal protocol CT or [name] recommended for further evaluation. No stones in the kidneys or ureters. No hydronephrosis. No perinephric or periureteral stranding. Urinary bladder is unremarkable. GI AND BOWEL: Stomach demonstrates no acute abnormality. There is no bowel obstruction. PERITONEUM AND RETROPERITONEUM: Small amount of free fluid in the right paracolic gutter and in the pelvis. No free air. VASCULATURE: Atheromatous calcifications of the aorta and its branches. LYMPH NODES: No lymphadenopathy. REPRODUCTIVE ORGANS: Status post hysterectomy. BONES AND SOFT TISSUES: No acute osseous abnormality. No focal soft tissue abnormality. IMPRESSION: 1. Left lobe hepatic cystic lesion now measures 5.5 cm, decreased from 6.4 cm on the prior study, compatible with hepatic cysts; a few additional subcentimeter hepatic hypodensities likely represent cysts 2. Possible exophytic mass arising from the inferior pole of the right kidney measuring 4.7 cm; indeterminate renal massrecommend prompt renal protocol [name] (preferred) or CT without and with contrast for characterization and urology consultation if suspicious on dedicated imaging 3. Small amount of free fluid in the right paracolic gutter and in the pelvis Electronically signed by: Fonda Field MD 08/25/2024 11:40 AM EST RP Workstation: [id number]   CT HEAD WO CONTRAST ( ) Result Date: 08/25/2024 EXAM: CT  HEAD WITHOUT CONTRAST 08/25/2024 11:27:00 AM TECHNIQUE: CT of the head was performed without the administration of intravenous contrast. Automated exposure control, iterative reconstruction, and/or weight based adjustment of the mA/kV was utilized to reduce the radiation dose to as low as reasonably achievable. COMPARISON: [name] 03/22/2022. CLINICAL HISTORY: Head trauma, minor (Age >= 65y). FINDINGS: BRAIN AND VENTRICLES: No acute hemorrhage. No evidence of acute infarct. Prominence of the sulci and ventricles compatible with brain atrophy. Hypoattenuating foci in the cerebral white matter, most likely representing chronic small vessel disease. No extra-axial collection. No mass effect or midline shift. ORBITS: No acute abnormality. SINUSES: No acute abnormality. SOFT TISSUES AND SKULL: No acute soft tissue abnormality. No skull fracture. IMPRESSION: 1. No acute intracranial abnormality. Electronically signed by: Waddell Calk MD 08/25/2024 11:34 AM EST RP Workstation: [id number]   DG Chest Port 1 View Result Date: 08/25/2024 EXAM: 1 VIEW(S) XRAY OF THE CHEST 08/25/2024 11:11:00 AM COMPARISON: 06/18/2024 CLINICAL HISTORY: Questionable sepsis - evaluate for abnormality FINDINGS: LUNGS AND PLEURA: Vascular congestion. Small right-sided pleural effusion. No focal pulmonary opacity. No pneumothorax. HEART AND MEDIASTINUM: Prominent cardiac silhouette. BONES AND SOFT TISSUES: No acute osseous abnormality. IMPRESSION: 1. Vascular congestion and small right-sided pleural effusion. 2. Prominent cardiac silhouette. Electronically signed by: Fonda Field MD 08/25/2024 11:28 AM EST RP Workstation: [id number]  Yerachmiel Spinney, DO [facility] 08/26/2024, 1:52 PM    Dictation software may have been used to generate the above note. Typos may occur and escape review in typed/dictated notes. Please contact Dr Marsa directly for clarity if needed.  Staff may message me via secure chat in Epic   but this may not receive an immediate response,  please page me for urgent matters!  If 7PM-7AM, please contact night coverage [email or web address]

## 2024-08-27 DIAGNOSIS — N390 Urinary tract infection, site not specified: Secondary | ICD-10-CM | POA: Diagnosis not present

## 2024-08-27 LAB — URINE CULTURE: Culture: >=100000 — AB

## 2024-08-27 MED ORDER — QUETIAPINE FUMARATE 25 MG PO TABS
25.0000 mg | ORAL_TABLET | Freq: Every day | ORAL | Status: DC
  Administered 2024-08-28 – 2024-08-31 (×4): 25 mg via ORAL
  Filled 2024-08-27 (×4): qty 1

## 2024-08-27 MED ORDER — HYDRALAZINE HCL 50 MG PO TABS
50.0000 mg | ORAL_TABLET | Freq: Three times a day (TID) | ORAL | Status: DC
  Administered 2024-08-28 – 2024-09-01 (×13): 50 mg via ORAL
  Filled 2024-08-27 (×14): qty 1

## 2024-08-27 MED ORDER — GABAPENTIN 100 MG PO CAPS
100.0000 mg | ORAL_CAPSULE | Freq: Two times a day (BID) | ORAL | Status: DC
  Administered 2024-08-27 – 2024-09-01 (×10): 100 mg via ORAL
  Filled 2024-08-27 (×10): qty 1

## 2024-08-27 MED ORDER — LORAZEPAM 2 MG/ML IJ SOLN
1.0000 mg | Freq: Four times a day (QID) | INTRAMUSCULAR | Status: AC | PRN
  Administered 2024-08-27 – 2024-08-28 (×2): 1 mg via INTRAVENOUS
  Filled 2024-08-27 (×2): qty 1

## 2024-08-27 MED ORDER — LISINOPRIL 10 MG PO TABS
10.0000 mg | ORAL_TABLET | Freq: Every day | ORAL | Status: DC
  Administered 2024-08-27 – 2024-09-01 (×6): 10 mg via ORAL
  Filled 2024-08-27 (×6): qty 1

## 2024-08-27 NOTE — Progress Notes (Signed)
 Physical Therapy Treatment Patient Details Name: [name] MRN: [id number] DOB: 11-Apr-1941 Today's Date: 08/27/2024   History of Present Illness Pt is a 83 year old female who presents ED for chief concerns of altered mental status and fever. Current MD assessment: complicated UTI. PMH of dementia, atrial fibrillation on Eliquis , CKD stage IV, rheumatoid arthritis, hypertension, hyperlipidemia, history of left hepatic lobe cyst.    PT Comments  Pt alert, pleasant and agreeable to participate in PT tx this date. Pt was received in bed, supervision to exit bed with increased time/effort and use of bed features. Pt performed STS from lowest bed height with CGA and VC for hand placement. Pt amb ~171ft with CGA, no LOB throughout, maintained short, shuffled pattern throughout but appeared to have less instances of R toes catching in swing phase this date. Pt able to navigate set of 4 stairs with bilateral hand rails and CGA, step-to pattern with no LOB or buckling. HR and SpO2 WFL following ambulation with pt on RA. Pt returned to supine with minA to assist BLE into bed. Pt was left semi-reclined in bed at end of session with all needs in reach, supportive family at bedside. The patient would benefit from further skilled PT intervention to continue to progress towards goals.      If plan is discharge home, recommend the following: A little help with walking and/or transfers;A little help with bathing/dressing/bathroom;Assistance with cooking/housework;Direct supervision/assist for medications management;Direct supervision/assist for financial management;Assist for transportation;Help with stairs or ramp for entrance   Can travel by private vehicle        Equipment Recommendations  Rolling walker (2 wheels);BSC/3in1    Recommendations for Other Services       Precautions / Restrictions Precautions Precautions: Fall Recall of Precautions/Restrictions: Impaired Restrictions Weight Bearing  Restrictions Per Provider Order: No     Mobility  Bed Mobility Overal bed mobility: Needs Assistance Bed Mobility: Supine to Sit, Sit to Supine     Supine to sit: Supervision, HOB elevated Sit to supine: Min assist, HOB elevated   General bed mobility comments: No physical assist to exit bed- inc time/effort to complete. Min A to assist BLE into bed    Transfers Overall transfer level: Needs assistance Equipment used: Rolling walker (2 wheels) Transfers: Sit to/from Stand Sit to Stand: Contact guard assist           General transfer comment: CGA to stand from lowest bed height with VC for hand placement on RW    Ambulation/Gait Ambulation/Gait assistance: Contact guard assist Gait Distance (Feet): 150 Feet Assistive device: Rolling walker (2 wheels) Gait Pattern/deviations: Step-through pattern, Decreased stride length, Shuffle, Narrow base of support Gait velocity: decreased     General Gait Details: No LOB, less instances of R toes catching in swing than previous session. Maintains short, shuffled gait pattern throughout   Stairs Stairs: Yes Stairs assistance: Contact guard assist Stair Management: Two rails, Step to pattern Number of Stairs: 4 General stair comments: Able to navigate set of 4 stairs with bilateral hand rail and CGA. No LOB throughout, maintained step-to pattern with good eccentric lowering when descending   Wheelchair Mobility     Tilt Bed    Modified Rankin (Stroke Patients Only)       Balance Overall balance assessment: Needs assistance Sitting-balance support: Feet supported Sitting balance-Leahy Scale: Good Sitting balance - Comments: steady static and dynamic sitting   Standing balance support: Bilateral upper extremity supported Standing balance-Leahy Scale: Fair Standing balance comment: more steady  with external support                            Communication Communication Communication: Impaired Factors  Affecting Communication: Hearing impaired  Cognition Arousal: Alert Behavior During Therapy: WFL for tasks assessed/performed   PT - Cognitive impairments: History of cognitive impairments                       PT - Cognition Comments: Pt A&Ox3, hx of dementia at baseline but pleasant and able to follow simple commands consistently Following commands: Impaired Following commands impaired: Follows one step commands with increased time    Cueing Cueing Techniques: Verbal cues, Tactile cues, Visual cues  Exercises Other Exercises Other Exercises: HR in mid to high 80s with SpO2 96% on RA after completing ambulation    General Comments        Pertinent Vitals/Pain Pain Assessment Pain Assessment: Faces Faces Pain Scale: Hurts a little bit Pain Location: stomach Pain Descriptors / Indicators: Aching Pain Intervention(s): Monitored during session, Repositioned    Home Living                          Prior Function            PT Goals (current goals can now be found in the care plan section) Progress towards PT goals: Progressing toward goals    Frequency    Min 2X/week      PT Plan      Co-evaluation              AM-PAC PT 6 Clicks Mobility   Outcome Measure  Help needed turning from your back to your side while in a flat bed without using bedrails?: A Little Help needed moving from lying on your back to sitting on the side of a flat bed without using bedrails?: A Little Help needed moving to and from a bed to a chair (including a wheelchair)?: A Little Help needed standing up from a chair using your arms (e.g., wheelchair or bedside chair)?: A Little Help needed to walk in hospital room?: A Little Help needed climbing 3-5 steps with a railing? : A Little 6 Click Score: 18    End of Session Equipment Utilized During Treatment: Gait belt Activity Tolerance: Patient tolerated treatment well Patient left: in bed;with call bell/phone  within reach;with bed alarm set;with family/visitor present Nurse Communication: Mobility status PT Visit Diagnosis: Other abnormalities of gait and mobility (R26.89);Muscle weakness (generalized) (M62.81)     Time: 8644-8592 PT Time Calculation (min) (ACUTE ONLY): 12 min  Charges:    $Gait Training: 8-22 mins PT General Charges $$ ACUTE PT VISIT: 1 Visit                     Alessandra Sawdey, SPT

## 2024-08-27 NOTE — Progress Notes (Signed)
 PROGRESS NOTE    NALANY STEEDLEY   FMW:[id number] DOB: [birth year]  DOA: 08/25/2024 Date of Service: 08/27/24 which is hospital day 2  PCP: Avelina Greig BRAVO, MD    Hospital course / significant events:   Ms. Linde Wilensky is a 83 year old female with history of dementia, atrial fibrillation on Eliquis , CKD stage IV, rheumatoid arthritis, hypertension, hyperlipidemia, history of left hepatic lobe cyst, who presents ED for chief concerns of altered mental status and fever.  11/16: to ED. Tmax 102. Encephalopathic. Broad abx cefepime + vanc + flagyl. Admitted to hospitalist 11/17: (+)Ecoli bacteremia, abx to ceftriaxone . Pt mental status improved/baseline. Continue abx await susceptibilities  11/18: UCx susceptibilities resulted, await BCx susceptibilities per ID pharmacist recs to guide final abx but hopefully will be able to dc on amox or keflex  unless come back with higher MIC on blood cx      Consultants:  none  Procedures/Surgeries: none      ASSESSMENT & PLAN:   Complicated UTI (urinary tract infection) (+)Ecoli bacteremia Sepsis ruled out  Metabolic encephalopathy d/t UTI in elderly patient - resolved  Blood culture x 2 --> (+)Ecoli, susceptibilities pending  urine culture (+)Ecoli, pansensitive dc cefepime, vancomycin, metronidazole --> started ceftriaxone   UCx susceptibilities resulted, await BCx susceptibilities per ID pharmacist recs to guide final abx but hopefully will be able to dc on amox or keflex  unless come back with higher MIC on blood cx    CKD (chronic kidney disease) stage 4, GFR 15-29 ml/min  At baseline Monitor BMP   GERD (gastroesophageal reflux disease) PPI   Essential hypertension Resume home cardura  lower dose titrate as needed Holding home lasix , losartan , hydralazine  for now  Hydralazine  5 mg IV every 6 hours as needed for SBP greater 165, 5 days ordered   HYPERLIPIDEMIA Rosuvastatin  10 mg nightly resume   Atrial  fibrillation Eliquis  Amiodarone , uncertain why taking this 6 of 7 days per week but ok for 100 mg daily per last cardio note   Mild cognitive impairment Hospital delirium Insomnia Ativan prn Seroquel at bedtime prn     No concerns based on BMI: Body mass index is 23.28 kg/m.SABRA Significantly low or high BMI is associated with higher medical risk.  Underweight - under 18  overweight - 25 to 29 obese - 30 or more Class 1 obesity: BMI of 30.0 to 34 Class 2 obesity: BMI of 35.0 to 39 Class 3 obesity: BMI of 40.0 to 49 Super Morbid Obesity: BMI 50-59 Super-super Morbid Obesity: BMI 60+ Healthy nutrition and physical activity advised as adjunct to other disease management and risk reduction treatments    DVT prophylaxis: Eliquis  IV fluids: no continuous IV fluids  Nutrition: regular Central lines / other devices: none  Code Status: FULL CODE ACP documentation reviewed:  none on file in [facility]  TOC needs: TBD Medical barriers to dispo: IV abx, pend cultures. Expected medical readiness for discharge tomorrow             Subjective / Brief ROS:  Patient reports feeling better today Insomnia has been a problem, family reports she's less confused today but a little loopy w/ lack of sleep  Denies CP/SOB.  Pain controlled.  Denies new weakness.  Tolerating diet   Family Communication: pt's husband and pt's daughters at bedside on rounds     Objective Findings:  Vitals:   08/27/24 0508 08/27/24 0800 08/27/24 0821 08/27/24 1320  BP: (!) 178/77 (!) 165/73 (!) 165/73 (!) 165/73  Pulse: (!) 57 65  Resp:  16    Temp: 98.2 F (36.8 C) 98.9 F (37.2 C)    TempSrc: Oral Oral    SpO2: 100% 100%    Weight:      Height:        Intake/Output Summary (Last 24 hours) at 08/27/2024 1654 Last data filed at 08/27/2024 0954 Gross per 24 hour  Intake 240 ml  Output --  Net 240 ml   Filed Weights   08/25/24 1047  Weight: 59.6 kg    Examination:  Physical  Exam Constitutional:      General: She is not in acute distress. Cardiovascular:     Rate and Rhythm: Normal rate and regular rhythm.     Heart sounds: Murmur heard.  Pulmonary:     Effort: Pulmonary effort is normal.     Breath sounds: Normal breath sounds.  Abdominal:     Palpations: Abdomen is soft.  Musculoskeletal:     Right lower leg: No edema.     Left lower leg: No edema.  Skin:    General: Skin is warm and dry.  Neurological:     Mental Status: She is alert and oriented to person, place, and time. Mental status is at baseline.  Psychiatric:        Mood and Affect: Mood normal.        Behavior: Behavior normal.          Scheduled Medications:   amiodarone   100 mg Oral Daily   apixaban   2.5 mg Oral BID   brinzolamide  1 drop Both Eyes QHS   And   brimonidine  1 drop Both Eyes QHS   doxazosin   1 mg Oral Daily   gabapentin   100 mg Oral BID   latanoprost   1 drop Both Eyes QHS   lisinopril  10 mg Oral Daily   pantoprazole   40 mg Oral Daily   QUEtiapine  25 mg Oral QHS   rosuvastatin   10 mg Oral QHS   sucralfate   1 g Oral TID WC & HS    Continuous Infusions:  cefTRIAXone  (ROCEPHIN )  IV 2 g (08/27/24 0825)    PRN Medications:  acetaminophen  **OR** acetaminophen , hydrALAZINE , LORazepam, ondansetron  **OR** ondansetron  (ZOFRAN ) IV, senna-docusate  Antimicrobials from admission:  Anti-infectives (From admission, onward)    Start     Dose/Rate Route Frequency Ordered Stop   08/26/24 1100  ceFEPIme (MAXIPIME) 2 g in sodium chloride  0.9 % 100 mL IVPB  Status:  Discontinued        2 g 200 mL/hr over 30 Minutes Intravenous Every 24 hours 08/25/24 1328 08/25/24 2338   08/26/24 1000  cefTRIAXone  (ROCEPHIN ) 2 g in sodium chloride  0.9 % 100 mL IVPB        2 g 200 mL/hr over 30 Minutes Intravenous Every 24 hours 08/25/24 2337     08/25/24 2200  metroNIDAZOLE (FLAGYL) IVPB 500 mg  Status:  Discontinued        500 mg 100 mL/hr over 60 Minutes Intravenous Every 12  hours 08/25/24 1323 08/25/24 2338   08/25/24 1341  vancomycin variable dose per unstable renal function (pharmacist dosing)  Status:  Discontinued         Does not apply See admin instructions 08/25/24 1345 08/25/24 2338   08/25/24 1100  ceFEPIme (MAXIPIME) 2 g in sodium chloride  0.9 % 100 mL IVPB        2 g 200 mL/hr over 30 Minutes Intravenous  Once 08/25/24 1048 08/25/24 1155   08/25/24  1100  metroNIDAZOLE (FLAGYL) IVPB 500 mg        500 mg 100 mL/hr over 60 Minutes Intravenous  Once 08/25/24 1048 08/25/24 1209   08/25/24 1100  vancomycin (VANCOCIN) IVPB 1000 mg/200 mL premix        1,000 mg 200 mL/hr over 60 Minutes Intravenous  Once 08/25/24 1048 08/25/24 1221           Data Reviewed:  I have personally reviewed the following...  CBC: Recent Labs  Lab 08/22/24 1414 08/25/24 1048 08/26/24 0431  WBC 7.8 9.7 7.9  NEUTROABS 5.6 8.6*  --   HGB 9.9* 9.9* 8.2*  HCT 29.7* 29.8* 24.1*  MCV 94.6 92.3 91.6  PLT 175 139* 108*   Basic Metabolic Panel: Recent Labs  Lab 08/22/24 1415 08/25/24 1154 08/26/24 0431  NA 142 137 137  K 3.3* 3.3* 3.4*  CL 110 105 106  CO2 23 20* 21*  GLUCOSE 116* 114* 108*  BUN 26* 31* 36*  CREATININE 1.83* 2.36* 2.40*  CALCIUM  9.5 8.6* 8.3*   GFR: Estimated Creatinine Clearance: 14.9 mL/min (A) (by C-G formula based on SCr of 2.4 mg/dL (H)). Liver Function Tests: Recent Labs  Lab 08/22/24 1415 08/25/24 1154  AST 18 19  ALT 11 9  ALKPHOS 50 61  BILITOT 0.6 0.8  PROT 7.6 5.8*  ALBUMIN 4.2 3.1*   No results for input(s): LIPASE, AMYLASE in the last 168 hours. No results for input(s): AMMONIA in the last 168 hours. Coagulation Profile: Recent Labs  Lab 08/25/24 1048  INR 1.3*   Cardiac Enzymes: Recent Labs  Lab 08/25/24 1530  CKTOTAL 135   BNP (last 3 results) Recent Labs    06/18/24 1049  PROBNP 305.0*   HbA1C: No results for input(s): HGBA1C in the last 72 hours. CBG: No results for input(s): GLUCAP in  the last 168 hours. Lipid Profile: No results for input(s): CHOL, HDL, LDLCALC, TRIG, CHOLHDL, LDLDIRECT in the last 72 hours. Thyroid  Function Tests: No results for input(s): TSH, T4TOTAL, FREET4, T3FREE, THYROIDAB in the last 72 hours. Anemia Panel: No results for input(s): VITAMINB12, FOLATE, FERRITIN, TIBC, IRON, RETICCTPCT in the last 72 hours. Most Recent Urinalysis On File:     Component Value Date/Time   COLORURINE AMBER (A) 08/25/2024 1103   APPEARANCEUR CLOUDY (A) 08/25/2024 1103   APPEARANCEUR Cloudy 08/19/2012 0208   LABSPEC 1.011 08/25/2024 1103   LABSPEC 1.009 08/19/2012 0208   PHURINE 5.0 08/25/2024 1103   GLUCOSEU NEGATIVE 08/25/2024 1103   GLUCOSEU Negative 08/19/2012 0208   HGBUR LARGE (A) 08/25/2024 1103   BILIRUBINUR NEGATIVE 08/25/2024 1103   BILIRUBINUR neg 01/21/2022 1130   BILIRUBINUR Negative 08/19/2012 0208   KETONESUR NEGATIVE 08/25/2024 1103   PROTEINUR >=300 (A) 08/25/2024 1103   UROBILINOGEN 1.0 01/21/2022 1130   UROBILINOGEN 0.2 01/08/2011 1553   NITRITE NEGATIVE 08/25/2024 1103   LEUKOCYTESUR MODERATE (A) 08/25/2024 1103   LEUKOCYTESUR 2+ 08/19/2012 0208   Sepsis Labs: @LABRCNTIP (procalcitonin:4,lacticidven:4) Microbiology: Recent Results (from the past 240 hours)  Resp panel by RT-PCR (RSV, Flu A&B, Covid) Anterior Nasal Swab     Status: None   Collection Time: 08/25/24 10:48 AM   Specimen: Anterior Nasal Swab  Result Value Ref Range Status   SARS Coronavirus 2 by RT PCR NEGATIVE NEGATIVE Final    Comment: (NOTE) SARS-CoV-2 target nucleic acids are NOT DETECTED.  The SARS-CoV-2 RNA is generally detectable in upper respiratory specimens during the acute phase of infection. The lowest concentration of SARS-CoV-2 viral copies  this assay can detect is 138 copies/mL. A negative result does not preclude SARS-Cov-2 infection and should not be used as the sole basis for treatment or other patient management  decisions. A negative result may occur with  improper specimen collection/handling, submission of specimen other than nasopharyngeal swab, presence of viral mutation(s) within the areas targeted by this assay, and inadequate number of viral copies(<138 copies/mL). A negative result must be combined with clinical observations, patient history, and epidemiological information. The expected result is Negative.  Fact Sheet for Patients:  bloggercourse.com  Fact Sheet for Healthcare Providers:  seriousbroker.it  This test is no t yet approved or cleared by the [place]  FDA and  has been authorized for detection and/or diagnosis of SARS-CoV-2 by FDA under an Emergency Use Authorization (EUA). This EUA will remain  in effect (meaning this test can be used) for the duration of the COVID-19 declaration under Section 564(b)(1) of the Act, 21 U.S.C.section 360bbb-3(b)(1), unless the authorization is terminated  or revoked sooner.       Influenza A by PCR NEGATIVE NEGATIVE Final   Influenza B by PCR NEGATIVE NEGATIVE Final    Comment: (NOTE) The Xpert Xpress SARS-CoV-2/FLU/RSV plus assay is intended as an aid in the diagnosis of influenza from Nasopharyngeal swab specimens and should not be used as a sole basis for treatment. Nasal washings and aspirates are unacceptable for Xpert Xpress SARS-CoV-2/FLU/RSV testing.  Fact Sheet for Patients: bloggercourse.com  Fact Sheet for Healthcare Providers: seriousbroker.it  This test is not yet approved or cleared by the [place]  FDA and has been authorized for detection and/or diagnosis of SARS-CoV-2 by FDA under an Emergency Use Authorization (EUA). This EUA will remain in effect (meaning this test can be used) for the duration of the COVID-19 declaration under Section 564(b)(1) of the Act, 21 U.S.C. section 360bbb-3(b)(1), unless the  authorization is terminated or revoked.     Resp Syncytial Virus by PCR NEGATIVE NEGATIVE Final    Comment: (NOTE) Fact Sheet for Patients: bloggercourse.com  Fact Sheet for Healthcare Providers: seriousbroker.it  This test is not yet approved or cleared by the [place]  FDA and has been authorized for detection and/or diagnosis of SARS-CoV-2 by FDA under an Emergency Use Authorization (EUA). This EUA will remain in effect (meaning this test can be used) for the duration of the COVID-19 declaration under Section 564(b)(1) of the Act, 21 U.S.C. section 360bbb-3(b)(1), unless the authorization is terminated or revoked.  Performed at [facility], [address] [place], [place] [address]   Blood Culture (routine x 2)     Status: Abnormal (Preliminary result)   Collection Time: 08/25/24 10:48 AM   Specimen: BLOOD  Result Value Ref Range Status   Specimen Description   Final    BLOOD BLOOD RIGHT HAND Performed at [facility], [address] [place], [place] [address]    Special Requests   Final    BOTTLES DRAWN AEROBIC AND ANAEROBIC Blood Culture adequate volume Performed at [facility], [address] [place], [place] [address]    Culture  Setup Time   Final    Organism ID to follow IN BOTH AEROBIC AND ANAEROBIC BOTTLES GRAM NEGATIVE RODS CRITICAL RESULT CALLED TO, READ BACK BY AND VERIFIED WITH: NATHAN BELUE 08/25/24 2300 KLW Performed at [facility], [address], [place], [place] [address]    Culture (A)  Final    ESCHERICHIA COLI SUSCEPTIBILITIES TO FOLLOW Performed at [facility], [address] [place], [place] [address]    Report Status PENDING  Incomplete  Blood Culture ID Panel (Reflexed)     Status: Abnormal   Collection Time: 08/25/24 10:48 AM  Result Value Ref Range Status   Enterococcus faecalis NOT DETECTED NOT DETECTED Final   Enterococcus  Faecium NOT DETECTED NOT DETECTED Final   Listeria monocytogenes NOT DETECTED NOT DETECTED Final   Staphylococcus species NOT DETECTED NOT DETECTED Final   Staphylococcus aureus (BCID) NOT DETECTED NOT DETECTED Final   Staphylococcus epidermidis NOT DETECTED NOT DETECTED Final   Staphylococcus lugdunensis NOT DETECTED NOT DETECTED Final   Streptococcus species NOT DETECTED NOT DETECTED Final   Streptococcus agalactiae NOT DETECTED NOT DETECTED Final   Streptococcus pneumoniae NOT DETECTED NOT DETECTED Final   Streptococcus pyogenes NOT DETECTED NOT DETECTED Final   A.calcoaceticus-baumannii NOT DETECTED NOT DETECTED Final   Bacteroides fragilis NOT DETECTED NOT DETECTED Final   Enterobacterales DETECTED (A) NOT DETECTED Final    Comment: Enterobacterales represent a large order of gram negative bacteria, not a single organism. CRITICAL RESULT CALLED TO, READ BACK BY AND VERIFIED WITH: NATHAN BELUE 08/25/24 2300 KLW    Enterobacter cloacae complex NOT DETECTED NOT DETECTED Final   Escherichia coli DETECTED (A) NOT DETECTED Final    Comment: CRITICAL RESULT CALLED TO, READ BACK BY AND VERIFIED WITH: NATHAN BELUE 08/25/24 2300 KLW    Klebsiella aerogenes NOT DETECTED NOT DETECTED Final   Klebsiella oxytoca NOT DETECTED NOT DETECTED Final   Klebsiella pneumoniae NOT DETECTED NOT DETECTED Final   Proteus species NOT DETECTED NOT DETECTED Final   Salmonella species NOT DETECTED NOT DETECTED Final   Serratia marcescens NOT DETECTED NOT DETECTED Final   Haemophilus influenzae NOT DETECTED NOT DETECTED Final   Neisseria meningitidis NOT DETECTED NOT DETECTED Final   Pseudomonas aeruginosa NOT DETECTED NOT DETECTED Final   Stenotrophomonas maltophilia NOT DETECTED NOT DETECTED Final   Candida albicans NOT DETECTED NOT DETECTED Final   Candida auris NOT DETECTED NOT DETECTED Final   Candida glabrata NOT DETECTED NOT DETECTED Final   Candida krusei NOT DETECTED NOT DETECTED Final   Candida  parapsilosis NOT DETECTED NOT DETECTED Final   Candida tropicalis NOT DETECTED NOT DETECTED Final   Cryptococcus neoformans/gattii NOT DETECTED NOT DETECTED Final   CTX-M ESBL NOT DETECTED NOT DETECTED Final   Carbapenem resistance IMP NOT DETECTED NOT DETECTED Final   Carbapenem resistance KPC NOT DETECTED NOT DETECTED Final   Carbapenem resistance NDM NOT DETECTED NOT DETECTED Final   Carbapenem resist OXA 48 LIKE NOT DETECTED NOT DETECTED Final   Carbapenem resistance VIM NOT DETECTED NOT DETECTED Final    Comment: Performed at [facility], [address] [place], [place] [address]  Blood Culture (routine x 2)     Status: None (Preliminary result)   Collection Time: 08/25/24 11:03 AM   Specimen: BLOOD LEFT HAND  Result Value Ref Range Status   Specimen Description BLOOD LEFT HAND  Final   Special Requests   Final    BOTTLES DRAWN AEROBIC AND ANAEROBIC Blood Culture results may not be optimal due to an inadequate volume of blood received in culture bottles   Culture  Setup Time   Final    GRAM NEGATIVE RODS IN BOTH AEROBIC AND ANAEROBIC BOTTLES CRITICAL RESULT CALLED TO, READ BACK BY AND VERIFIED WITH: NATHAN BELUE 08/25/24 2300 KLW Performed at [facility], [address], [place], [place] [address]    Culture GRAM NEGATIVE RODS  Final  Report Status PENDING  Incomplete  Urine Culture     Status: Abnormal   Collection Time: 08/25/24 11:03 AM   Specimen: Urine, Random  Result Value Ref Range Status   Specimen Description   Final    URINE, RANDOM Performed at [facility], [address] [place], [place] [address]    Special Requests   Final    NONE Reflexed from [id number] Performed at [facility], [address] [place], [place] [address]    Culture >=100,000 COLONIES/mL ESCHERICHIA COLI (A)  Final   Report Status 08/27/2024 FINAL  Final   Organism ID, Bacteria ESCHERICHIA COLI (A)  Final      Susceptibility   Escherichia coli -  MIC*    AMPICILLIN <=2 SENSITIVE Sensitive     CEFAZOLIN (URINE) Value in next row Sensitive      <=1 SENSITIVEThis is a modified FDA-approved test that has been validated and its performance characteristics determined by the reporting laboratory.  This laboratory is certified under the Clinical Laboratory Improvement Amendments CLIA as qualified to perform high complexity clinical laboratory testing.    CEFEPIME Value in next row Sensitive      <=1 SENSITIVEThis is a modified FDA-approved test that has been validated and its performance characteristics determined by the reporting laboratory.  This laboratory is certified under the Clinical Laboratory Improvement Amendments CLIA as qualified to perform high complexity clinical laboratory testing.    ERTAPENEM Value in next row Sensitive      <=1 SENSITIVEThis is a modified FDA-approved test that has been validated and its performance characteristics determined by the reporting laboratory.  This laboratory is certified under the Clinical Laboratory Improvement Amendments CLIA as qualified to perform high complexity clinical laboratory testing.    CEFTRIAXONE  Value in next row Sensitive      <=1 SENSITIVEThis is a modified FDA-approved test that has been validated and its performance characteristics determined by the reporting laboratory.  This laboratory is certified under the Clinical Laboratory Improvement Amendments CLIA as qualified to perform high complexity clinical laboratory testing.    CIPROFLOXACIN Value in next row Sensitive      <=1 SENSITIVEThis is a modified FDA-approved test that has been validated and its performance characteristics determined by the reporting laboratory.  This laboratory is certified under the Clinical Laboratory Improvement Amendments CLIA as qualified to perform high complexity clinical laboratory testing.    GENTAMICIN Value in next row Sensitive      <=1 SENSITIVEThis is a modified FDA-approved test that has been  validated and its performance characteristics determined by the reporting laboratory.  This laboratory is certified under the Clinical Laboratory Improvement Amendments CLIA as qualified to perform high complexity clinical laboratory testing.    NITROFURANTOIN Value in next row Sensitive      <=1 SENSITIVEThis is a modified FDA-approved test that has been validated and its performance characteristics determined by the reporting laboratory.  This laboratory is certified under the Clinical Laboratory Improvement Amendments CLIA as qualified to perform high complexity clinical laboratory testing.    TRIMETH/SULFA Value in next row Sensitive      <=1 SENSITIVEThis is a modified FDA-approved test that has been validated and its performance characteristics determined by the reporting laboratory.  This laboratory is certified under the Clinical Laboratory Improvement Amendments CLIA as qualified to perform high complexity clinical laboratory testing.    AMPICILLIN/SULBACTAM Value in next row Sensitive      <=1 SENSITIVEThis is a modified FDA-approved test that has been validated  and its performance characteristics determined by the reporting laboratory.  This laboratory is certified under the Clinical Laboratory Improvement Amendments CLIA as qualified to perform high complexity clinical laboratory testing.    PIP/TAZO Value in next row Sensitive      <=4 SENSITIVEThis is a modified FDA-approved test that has been validated and its performance characteristics determined by the reporting laboratory.  This laboratory is certified under the Clinical Laboratory Improvement Amendments CLIA as qualified to perform high complexity clinical laboratory testing.    MEROPENEM Value in next row Sensitive      <=4 SENSITIVEThis is a modified FDA-approved test that has been validated and its performance characteristics determined by the reporting laboratory.  This laboratory is certified under the Clinical Laboratory  Improvement Amendments CLIA as qualified to perform high complexity clinical laboratory testing.    * >=100,000 COLONIES/mL ESCHERICHIA COLI      Radiology Studies last 3 days: CT ABDOMEN PELVIS WO CONTRAST Result Date: 08/25/2024 EXAM: CT ABDOMEN AND PELVIS WITHOUT CONTRAST 08/25/2024 11:27:00 AM TECHNIQUE: CT of the abdomen and pelvis was performed without the administration of intravenous contrast. Multiplanar reformatted images are provided for review. Automated exposure control, iterative reconstruction, and/or weight-based adjustment of the mA/kV was utilized to reduce the radiation dose to as low as reasonably achievable. COMPARISON: Prior study from 06/19/2024. CLINICAL HISTORY: Abdominal pain, acute, nonlocalized. FINDINGS: LOWER CHEST: Small bilateral pleural effusions, bibasilar subsegmental atelectasis or minimal consolidation. LIVER: Left lobe hepatic cystic lesion now measures 5.5 cm compared to 6.4 cm on the prior study. A few additional subcentimeter hypodensities in the liver are likely cysts. GALLBLADDER AND BILE DUCTS: Gallbladder is unremarkable. No biliary ductal dilatation. SPLEEN: No acute abnormality. PANCREAS: No acute abnormality. ADRENAL GLANDS: No acute abnormality. KIDNEYS, URETERS AND BLADDER: Left kidney lesion consistent with cyst measuring 4.8 cm. Possible mass arising exophytically from the inferior pole of the right kidney measuring 4.7 cm. Renal protocol CT or [name] recommended for further evaluation. No stones in the kidneys or ureters. No hydronephrosis. No perinephric or periureteral stranding. Urinary bladder is unremarkable. GI AND BOWEL: Stomach demonstrates no acute abnormality. There is no bowel obstruction. PERITONEUM AND RETROPERITONEUM: Small amount of free fluid in the right paracolic gutter and in the pelvis. No free air. VASCULATURE: Atheromatous calcifications of the aorta and its branches. LYMPH NODES: No lymphadenopathy. REPRODUCTIVE ORGANS: Status post  hysterectomy. BONES AND SOFT TISSUES: No acute osseous abnormality. No focal soft tissue abnormality. IMPRESSION: 1. Left lobe hepatic cystic lesion now measures 5.5 cm, decreased from 6.4 cm on the prior study, compatible with hepatic cysts; a few additional subcentimeter hepatic hypodensities likely represent cysts 2. Possible exophytic mass arising from the inferior pole of the right kidney measuring 4.7 cm; indeterminate renal massrecommend prompt renal protocol [name] (preferred) or CT without and with contrast for characterization and urology consultation if suspicious on dedicated imaging 3. Small amount of free fluid in the right paracolic gutter and in the pelvis Electronically signed by: Fonda Field MD 08/25/2024 11:40 AM EST RP Workstation: [id number]   CT HEAD WO CONTRAST ( ) Result Date: 08/25/2024 EXAM: CT HEAD WITHOUT CONTRAST 08/25/2024 11:27:00 AM TECHNIQUE: CT of the head was performed without the administration of intravenous contrast. Automated exposure control, iterative reconstruction, and/or weight based adjustment of the mA/kV was utilized to reduce the radiation dose to as low as reasonably achievable. COMPARISON: [name] 03/22/2022. CLINICAL HISTORY: Head trauma, minor (Age >= 65y). FINDINGS: BRAIN AND VENTRICLES: No acute hemorrhage. No evidence of acute infarct. Prominence  of the sulci and ventricles compatible with brain atrophy. Hypoattenuating foci in the cerebral white matter, most likely representing chronic small vessel disease. No extra-axial collection. No mass effect or midline shift. ORBITS: No acute abnormality. SINUSES: No acute abnormality. SOFT TISSUES AND SKULL: No acute soft tissue abnormality. No skull fracture. IMPRESSION: 1. No acute intracranial abnormality. Electronically signed by: Waddell Calk MD 08/25/2024 11:34 AM EST RP Workstation: [id number]   DG Chest Port 1 View Result Date: 08/25/2024 EXAM: 1 VIEW(S) XRAY OF THE CHEST 08/25/2024 11:11:00 AM  COMPARISON: 06/18/2024 CLINICAL HISTORY: Questionable sepsis - evaluate for abnormality FINDINGS: LUNGS AND PLEURA: Vascular congestion. Small right-sided pleural effusion. No focal pulmonary opacity. No pneumothorax. HEART AND MEDIASTINUM: Prominent cardiac silhouette. BONES AND SOFT TISSUES: No acute osseous abnormality. IMPRESSION: 1. Vascular congestion and small right-sided pleural effusion. 2. Prominent cardiac silhouette. Electronically signed by: Fonda Field MD 08/25/2024 11:28 AM EST RP Workstation: FARLEY Laneta Blunt, DO [facility] 08/27/2024, 4:54 PM    Dictation software may have been used to generate the above note. Typos may occur and escape review in typed/dictated notes. Please contact Dr Blunt directly for clarity if needed.  Staff may message me via secure chat in Epic  but this may not receive an immediate response,  please page me for urgent matters!  If 7PM-7AM, please contact night coverage [email or web address]

## 2024-08-27 NOTE — Progress Notes (Signed)
 Occupational Therapy Treatment Patient Details Name: [name] MRN: [id number] DOB: [birth year] Today's Date: 08/27/2024   History of present illness Pt is a 83 year old female who presents ED for chief concerns of altered mental status and fever. Current MD assessment: complicated UTI. PMH of dementia, atrial fibrillation on Eliquis , CKD stage IV, rheumatoid arthritis, hypertension, hyperlipidemia, history of left hepatic lobe cyst.   OT comments  Pt is supine in bed on arrival. Easily arousable and agreeable to OT session. She denies pain, but is noted to mess with IV site frequently needing redirection. Nurse aware and planning to wrap with coban. Pt performed bed mobility with supervision to Min A for BLE management to return to bed at end of session, cues for hand placement and technique/initiation and attending to task. Pt easily distracted needing frequent redirection. Required CGA to stand and ambulate using RW to bathroom and back. While in bathroom, pt performed UB/LB bathing and dressing tasks in seated/standing positions. She required Min A for LB ADLs and supervision with cues for UB ADLs. She is most limited by her cognition at this time needing frequent cueing for all tasks. Family also reports pt has been awake for 24 hours and they are trying to get her to rest. Nurse state MD is aware. Pt returned to bed with all needs in place and will cont to require skilled acute OT services to maximize her safety and IND to return to PLOF.       If plan is discharge home, recommend the following:  A little help with walking and/or transfers;A little help with bathing/dressing/bathroom;Help with stairs or ramp for entrance;Assist for transportation;Supervision due to cognitive status;Direct supervision/assist for medications management;Direct supervision/assist for financial management   Equipment Recommendations  BSC/3in1;Other (comment) (RW)    Recommendations for Other Services       Precautions / Restrictions Precautions Precautions: Fall Recall of Precautions/Restrictions: Impaired Restrictions Weight Bearing Restrictions Per Provider Order: No       Mobility Bed Mobility Overal bed mobility: Needs Assistance Bed Mobility: Supine to Sit, Sit to Supine     Supine to sit: Supervision, HOB elevated Sit to supine: Min assist, HOB elevated   General bed mobility comments: increased time and cues to reach EOB, assist to bring BLEs back on to bed    Transfers Overall transfer level: Needs assistance Equipment used: Rolling walker (2 wheels) Transfers: Sit to/from Stand Sit to Stand: Contact guard assist           General transfer comment: cues for hand placement to stand from bed and BSC, and for RW proximity during ambulation     Balance Overall balance assessment: Needs assistance Sitting-balance support: Feet supported Sitting balance-Leahy Scale: Good     Standing balance support: Bilateral upper extremity supported Standing balance-Leahy Scale: Fair                             ADL either performed or assessed with clinical judgement   ADL Overall ADL's : Needs assistance/impaired     Grooming: Wash/dry face;Set up;Standing;Applying deodorant;Sitting Grooming Details (indicate cue type and reason): cues for initation of tasks and maintaining attention Upper Body Bathing: Sitting;Set up Upper Body Bathing Details (indicate cue type and reason): increased time and cues Lower Body Bathing: Contact guard assist;Sit to/from stand;Minimal assistance Lower Body Bathing Details (indicate cue type and reason): standing at RW with Min A for thoroughness Upper Body Dressing : Minimal assistance Upper Body  Dressing Details (indicate cue type and reason): to manage gown and take jacket off over IV Lower Body Dressing: Minimal assistance;Sitting/lateral leans;Sit to/from stand Lower Body Dressing Details (indicate cue type and reason): to  donn mesh underwear over feet Toilet Transfer: Contact guard assist;BSC/3in1 Toilet Transfer Details (indicate cue type and reason): sat on BSC for bathing tasks, CGA to stand from [facility] with cues for hand placement         Functional mobility during ADLs: Contact guard assist;Rolling walker (2 wheels) General ADL Comments: ambulated to/from bathroom using RW with CGA    Extremity/Trunk Assessment              Vision       Perception     Praxis     Communication Communication Communication: Impaired Factors Affecting Communication: Hearing impaired   Cognition Arousal: Alert Behavior During Therapy: WFL for tasks assessed/performed                                 Following commands: Impaired Following commands impaired: Follows one step commands with increased time      Cueing   Cueing Techniques: Verbal cues, Tactile cues, Visual cues  Exercises      Shoulder Instructions       General Comments more confused this date, pulling at IV during session, nurse aware and wrapping with coban    Pertinent Vitals/ Pain       Pain Assessment Pain Assessment: No/denies pain Pain Intervention(s): Monitored during session  Home Living                                          Prior Functioning/Environment              Frequency  Min 2X/week        Progress Toward Goals  OT Goals(current goals can now be found in the care plan section)  Progress towards OT goals: Progressing toward goals  Acute Rehab OT Goals Patient Stated Goal: get better OT Goal Formulation: With patient/family Time For Goal Achievement: 09/09/24 Potential to Achieve Goals: Good  Plan      Co-evaluation                 AM-PAC OT 6 Clicks Daily Activity     Outcome Measure   Help from another person eating meals?: None Help from another person taking care of personal grooming?: A Little Help from another person toileting, which includes  using toliet, bedpan, or urinal?: A Little Help from another person bathing (including washing, rinsing, drying)?: A Little Help from another person to put on and taking off regular upper body clothing?: None Help from another person to put on and taking off regular lower body clothing?: A Little 6 Click Score: 20    End of Session Equipment Utilized During Treatment: Rolling walker (2 wheels);Gait belt  OT Visit Diagnosis: Muscle weakness (generalized) (M62.81);Other abnormalities of gait and mobility (R26.89)   Activity Tolerance Patient tolerated treatment well   Patient Left with family/visitor present;in bed;with call bell/phone within reach;with bed alarm set   Nurse Communication Mobility status        Time: 8495-8465 OT Time Calculation (min): 30 min  Charges: OT General Charges $OT Visit: 1 Visit OT Treatments $Self Care/Home Management : 23-37 mins  Rushawn Capshaw Chrismon, OTR/L  08/27/24, 5:24 PM   Jerusalem Brownstein E Chrismon 08/27/2024, 5:22 PM

## 2024-08-27 NOTE — Plan of Care (Signed)
  Problem: Clinical Measurements: Goal: Diagnostic test results will improve Outcome: Progressing   Problem: Respiratory: Goal: Ability to maintain adequate ventilation will improve Outcome: Progressing   Problem: Health Behavior/Discharge Planning: Goal: Ability to manage health-related needs will improve Outcome: Progressing   Problem: Clinical Measurements: Goal: Diagnostic test results will improve Outcome: Progressing

## 2024-08-28 ENCOUNTER — Other Ambulatory Visit: Payer: Self-pay

## 2024-08-28 ENCOUNTER — Encounter: Payer: Self-pay | Admitting: Oncology

## 2024-08-28 DIAGNOSIS — N390 Urinary tract infection, site not specified: Secondary | ICD-10-CM | POA: Diagnosis not present

## 2024-08-28 LAB — CULTURE, BLOOD (ROUTINE X 2): Special Requests: ADEQUATE

## 2024-08-28 LAB — BASIC METABOLIC PANEL WITH GFR
Anion gap: 11 (ref 5–15)
BUN: 32 mg/dL — ABNORMAL HIGH (ref 8–23)
CO2: 24 mmol/L (ref 22–32)
Calcium: 8.8 mg/dL — ABNORMAL LOW (ref 8.9–10.3)
Chloride: 105 mmol/L (ref 98–111)
Creatinine, Ser: 2.41 mg/dL — ABNORMAL HIGH (ref 0.44–1.00)
GFR, Estimated: 19 mL/min — ABNORMAL LOW (ref >60)
Glucose, Bld: 131 mg/dL — ABNORMAL HIGH (ref 70–99)
Potassium: 3.2 mmol/L — ABNORMAL LOW (ref 3.5–5.1)
Sodium: 140 mmol/L (ref 135–145)

## 2024-08-28 MED ORDER — AMOXICILLIN 500 MG PO CAPS
1000.0000 mg | ORAL_CAPSULE | Freq: Two times a day (BID) | ORAL | 0 refills | Status: DC
  Filled 2024-08-28: qty 16, 4d supply, fill #0

## 2024-08-28 MED ORDER — POTASSIUM CHLORIDE 20 MEQ PO PACK
40.0000 meq | PACK | Freq: Once | ORAL | Status: AC
  Administered 2024-08-28: 40 meq via ORAL
  Filled 2024-08-28: qty 2

## 2024-08-28 MED ORDER — SENNOSIDES-DOCUSATE SODIUM 8.6-50 MG PO TABS
1.0000 | ORAL_TABLET | Freq: Every evening | ORAL | 0 refills | Status: DC | PRN
  Filled 2024-08-28: qty 20, 20d supply, fill #0

## 2024-08-28 NOTE — Care Management Important Message (Signed)
 Important Message  Patient Details  Name: [name] MRN: [id number] Date of Birth: 04-12-1941   Important Message Given:  Yes - Medicare IM     Gavriela Cashin W, CMA 08/28/2024, 2:06 PM

## 2024-08-28 NOTE — Progress Notes (Signed)
 Physical Therapy Treatment Patient Details Name: [name] MRN: [id number] DOB: 1941/01/21 Today's Date: 08/28/2024   History of Present Illness Pt is a 83 year old female who presents ED for chief concerns of altered mental status and fever. Current MD assessment: complicated UTI. PMH of dementia, atrial fibrillation on Eliquis , CKD stage IV, rheumatoid arthritis, hypertension, hyperlipidemia, history of left hepatic lobe cyst.    PT Comments  Significant mental status change from previous date where pt was able to ambulate in hall and negotiate stairs with PT. This date pt is very lethargic with difficulty keeping eyes open. Nursing attempted taking pt to bathroom which was determined to be unsafe when pt was unable to maintain sitting posture at EOB. PT session modified to bed mobility to assist nursing. Secure chat with Nursing and MD regarding planned d/c home today with concerns. ? Potential side effects from Ativan doses. Will reassess tomorrow for ability to safely d/c.   If plan is discharge home, recommend the following: A little help with walking and/or transfers;A little help with bathing/dressing/bathroom;Assistance with cooking/housework;Direct supervision/assist for medications management;Direct supervision/assist for financial management;Assist for transportation;Help with stairs or ramp for entrance   Can travel by private vehicle        Equipment Recommendations  Rolling walker (2 wheels);BSC/3in1    Recommendations for Other Services       Precautions / Restrictions Precautions Precautions: Fall Recall of Precautions/Restrictions: Impaired Restrictions Weight Bearing Restrictions Per Provider Order: No     Mobility  Bed Mobility Overal bed mobility: Needs Assistance Bed Mobility: Rolling Rolling: Mod assist         General bed mobility comments: Significant increased assist for bed mobility.    Transfers                   General transfer  comment:  (Unsafe, pt too lethargic to attempt standing, Nsg and MD notified)    Ambulation/Gait               General Gait Details:  (Unable to attempt)   Stairs             Wheelchair Mobility     Tilt Bed    Modified Rankin (Stroke Patients Only)       Balance                                            Communication Communication Communication: Impaired Factors Affecting Communication: Hearing impaired  Cognition Arousal: Lethargic Behavior During Therapy: WFL for tasks assessed/performed   PT - Cognitive impairments: History of cognitive impairments                       PT - Cognition Comments: Pt A&Ox3, hx of dementia at baseline but pleasant and able to follow simple commands consistently Following commands: Impaired Following commands impaired: Follows one step commands with increased time, Follows one step commands inconsistently    Cueing Cueing Techniques: Verbal cues, Tactile cues, Visual cues  Exercises      General Comments General comments (skin integrity, edema, etc.):  (Significant change from previous day, very lethargic, unable to safely mobilize possibly due to doses of Ativan)      Pertinent Vitals/Pain Pain Assessment Pain Assessment: No/denies pain    Home Living  Prior Function            PT Goals (current goals can now be found in the care plan section) Acute Rehab PT Goals Patient Stated Goal: to go home    Frequency    Min 2X/week      PT Plan      Co-evaluation              AM-PAC PT 6 Clicks Mobility   Outcome Measure  Help needed turning from your back to your side while in a flat bed without using bedrails?: A Little Help needed moving from lying on your back to sitting on the side of a flat bed without using bedrails?: A Little Help needed moving to and from a bed to a chair (including a wheelchair)?: A Little Help needed  standing up from a chair using your arms (e.g., wheelchair or bedside chair)?: A Little Help needed to walk in hospital room?: A Little Help needed climbing 3-5 steps with a railing? : A Little 6 Click Score: 18    End of Session   Activity Tolerance: Patient limited by lethargy Patient left: in bed;with call bell/phone within reach;with bed alarm set;with family/visitor present Nurse Communication: Mobility status;Other (comment) (Change in ms) PT Visit Diagnosis: Other abnormalities of gait and mobility (R26.89);Muscle weakness (generalized) (M62.81)     Time: 8569-8553 PT Time Calculation (min) (ACUTE ONLY): 16 min  Charges:    $Therapeutic Activity: 8-22 mins PT General Charges $$ ACUTE PT VISIT: 1 Visit                    Darice Bohr, PTA  Darice JAYSON Bohr 08/28/2024, 6:08 PM

## 2024-08-28 NOTE — Progress Notes (Signed)
 Progress Note   Patient: [name] FMW:[id number] DOB: Apr 29, 1941 DOA: 08/25/2024     3 DOS: the patient was seen and examined on 08/28/2024   Brief hospital course:   Ms. Erianna Jolly is a 83 year old female with history of dementia, atrial fibrillation on Eliquis , CKD stage IV, rheumatoid arthritis, hypertension, hyperlipidemia, history of left hepatic lobe cyst, who presents ED for chief concerns of altered mental status and fever.   11/16: to ED. Tmax 102. Encephalopathic. Broad abx cefepime + vanc + flagyl. Admitted to hospitalist 11/17: (+)Ecoli bacteremia, abx to ceftriaxone . Pt mental status improved/baseline. Continue abx await susceptibilities  11/18: UCx susceptibilities resulted, await BCx susceptibilities per ID pharmacist recs to guide final abx but hopefully will be able to dc on amox or keflex  unless come back with higher MIC on blood cx          Consultants:  none   Procedures/Surgeries: none           ASSESSMENT & PLAN:   Complicated UTI (urinary tract infection) (+)Ecoli bacteremia Sepsis ruled out  Metabolic encephalopathy d/t UTI in elderly patient - resolved  Blood culture x 2 --> (+)Ecoli, susceptibilities pending  urine culture (+)Ecoli, pansensitive dc cefepime, vancomycin, metronidazole --> started ceftriaxone   Discussed with ID pharmacist who recommends amoxicillin  1 g twice daily for 4 more days starting tomorrow   CKD (chronic kidney disease) stage 4, GFR 15-29 ml/min  At baseline Monitor BMP   GERD (gastroesophageal reflux disease) PPI   Essential hypertension Continue current antihypertensives as blood pressure allows   HYPERLIPIDEMIA Rosuvastatin  10 mg nightly resume   Atrial fibrillation Eliquis  Continue amiodarone    Mild cognitive impairment Hospital delirium Insomnia Ativan prn Seroquel at bedtime prn      DVT prophylaxis: Eliquis    Code Status: FULL CODE    TOC needs: TBD  Medical barriers to dispo:  IV abx, Expected medical readiness for discharge tomorrow    Subjective Patient seen and examined at bedside this morning Denies nausea vomiting abdominal pain chest pain Was initially planned for discharge today however when PT OT went in to later within the day patient was found to be very lethargic.   Family Communication: pt's husband and pt's daughters at bedside on rounds   Physical Exam Constitutional:      General: She is not in acute distress. Cardiovascular:     Rate and Rhythm: Normal rate and regular rhythm.     Heart sounds: Murmur heard.  Pulmonary:     Effort: Pulmonary effort is normal.     Breath sounds: Normal breath sounds.  Abdominal:     Palpations: Abdomen is soft.  Musculoskeletal:     Right lower leg: No edema.     Left lower leg: No edema.  Skin:    General: Skin is warm and dry.  Neurological:     Mental Status: She is alert and oriented to person, place, and time. Mental status is at baseline.  Psychiatric:        Mood and Affect: Mood normal.        Behavior: Behavior normal.    Vitals:   08/27/24 1955 08/28/24 0428 08/28/24 0833 08/28/24 1534  BP: 133/65 139/64 (!) 149/65 134/64  Pulse: (!) 57 63 (!) 50 (!) 55  Resp:  16 14 18   Temp: 98.6 F (37 C) 98.6 F (37 C) 98.7 F (37.1 C) 98 F (36.7 C)  TempSrc: Oral Oral  Oral  SpO2: 100% 98% 99% 100%  Weight:  Height:        Data Reviewed:    Latest Ref Rng & Units 08/26/2024    4:31 AM 08/25/2024   10:48 AM 08/22/2024    2:14 PM  CBC  WBC 4.0 - 10.5 K/uL 7.9  9.7  7.8   Hemoglobin 12.0 - 15.0 g/dL 8.2  9.9  9.9   Hematocrit 36.0 - 46.0 % 24.1  29.8  29.7   Platelets 150 - 400 K/uL 108  139  175        Latest Ref Rng & Units 08/28/2024   11:53 AM 08/26/2024    4:31 AM 08/25/2024   11:54 AM  BMP  Glucose 70 - 99 mg/dL 868  891  885   BUN 8 - 23 mg/dL 32  36  31   Creatinine 0.44 - 1.00 mg/dL 7.58  7.59  7.63   Sodium 135 - 145 mmol/L 140  137  137   Potassium 3.5 - 5.1  mmol/L 3.2  3.4  3.3   Chloride 98 - 111 mmol/L 105  106  105   CO2 22 - 32 mmol/L 24  21  20    Calcium  8.9 - 10.3 mg/dL 8.8  8.3  8.6        Author: Drue ONEIDA Potter, MD 08/28/2024 3:44 PM  For on call review [email or web address].

## 2024-08-28 NOTE — Plan of Care (Signed)

## 2024-08-29 ENCOUNTER — Other Ambulatory Visit: Payer: Self-pay

## 2024-08-29 DIAGNOSIS — N390 Urinary tract infection, site not specified: Secondary | ICD-10-CM | POA: Diagnosis not present

## 2024-08-29 LAB — CBC WITH DIFFERENTIAL/PLATELET
Abs Immature Granulocytes: 0.05 K/uL (ref 0.00–0.07)
Basophils Absolute: 0 K/uL (ref 0.0–0.1)
Basophils Relative: 1 %
Eosinophils Absolute: 0.2 K/uL (ref 0.0–0.5)
Eosinophils Relative: 3 %
HCT: 22.3 % — ABNORMAL LOW (ref 36.0–46.0)
Hemoglobin: 7.7 g/dL — ABNORMAL LOW (ref 12.0–15.0)
Immature Granulocytes: 1 %
Lymphocytes Relative: 24 %
Lymphs Abs: 1.6 K/uL (ref 0.7–4.0)
MCH: 30.7 pg (ref 26.0–34.0)
MCHC: 34.5 g/dL (ref 30.0–36.0)
MCV: 88.8 fL (ref 80.0–100.0)
Monocytes Absolute: 0.8 K/uL (ref 0.1–1.0)
Monocytes Relative: 11 %
Neutro Abs: 4 K/uL (ref 1.7–7.7)
Neutrophils Relative %: 60 %
Platelets: 153 K/uL (ref 150–400)
RBC: 2.51 MIL/uL — ABNORMAL LOW (ref 3.87–5.11)
RDW: 14 % (ref 11.5–15.5)
WBC: 6.6 K/uL (ref 4.0–10.5)
nRBC: 0 % (ref 0.0–0.2)

## 2024-08-29 LAB — BASIC METABOLIC PANEL WITH GFR
Anion gap: 8 (ref 5–15)
BUN: 30 mg/dL — ABNORMAL HIGH (ref 8–23)
CO2: 23 mmol/L (ref 22–32)
Calcium: 8.5 mg/dL — ABNORMAL LOW (ref 8.9–10.3)
Chloride: 113 mmol/L — ABNORMAL HIGH (ref 98–111)
Creatinine, Ser: 2.09 mg/dL — ABNORMAL HIGH (ref 0.44–1.00)
GFR, Estimated: 23 mL/min — ABNORMAL LOW (ref >60)
Glucose, Bld: 88 mg/dL (ref 70–99)
Potassium: 3.7 mmol/L (ref 3.5–5.1)
Sodium: 144 mmol/L (ref 135–145)

## 2024-08-29 MED ORDER — AMOXICILLIN 500 MG PO CAPS
1000.0000 mg | ORAL_CAPSULE | Freq: Two times a day (BID) | ORAL | Status: DC
  Administered 2024-08-29 – 2024-09-01 (×7): 1000 mg via ORAL
  Filled 2024-08-29 (×7): qty 2

## 2024-08-29 MED ORDER — SODIUM CHLORIDE 0.9 % IV BOLUS
1000.0000 mL | Freq: Once | INTRAVENOUS | Status: AC
  Administered 2024-08-29: 1000 mL via INTRAVENOUS

## 2024-08-29 NOTE — Progress Notes (Signed)
 Progress Note   Patient: [name] FMW:[id number] DOB: 1941-01-25 DOA: 08/25/2024     4 DOS: the patient was seen and examined on 08/29/2024     Brief hospital course:    Ms. Ashley Bultema is a 83 year old female with history of dementia, atrial fibrillation on Eliquis , CKD stage IV, rheumatoid arthritis, hypertension, hyperlipidemia, history of left hepatic lobe cyst, who presents ED for chief concerns of altered mental status and fever.   11/16: to ED. Tmax 102. Encephalopathic. Broad abx cefepime  + vanc + flagyl . Admitted to hospitalist 11/17: (+)Ecoli bacteremia, abx to ceftriaxone . Pt mental status improved/baseline. Continue abx await susceptibilities  11/18: UCx susceptibilities resulted, await BCx susceptibilities per ID pharmacist recs to guide final abx but hopefully will be able to dc on amox or keflex  unless come back with higher MIC on blood cx          Consultants:  none   Procedures/Surgeries: none           ASSESSMENT & PLAN:   Complicated UTI (urinary tract infection) (+)Ecoli bacteremia Sepsis ruled out  Metabolic encephalopathy d/t UTI in elderly patient - resolved  Blood culture x 2 --> (+)Ecoli, susceptibilities pending  urine culture (+)Ecoli, pansensitive dc cefepime , vancomycin , metronidazole  --> started ceftriaxone  and transition to amoxicillin  to complete 1 g twice daily for 4 more days Discussed with ID pharmacist    CKD (chronic kidney disease) stage 4, GFR 15-29 ml/min  At baseline Monitor BMP   GERD (gastroesophageal reflux disease) PPI   Essential hypertension Orthostatic hypotension Patient given a liter of IV fluid Cardura  discontinued Continue other antihypertensives as blood pressure allows   HYPERLIPIDEMIA Rosuvastatin  10 mg nightly resume   Atrial fibrillation Eliquis  Continue amiodarone    Mild cognitive impairment Hospital delirium Insomnia Ativan  prn Seroquel  at bedtime prn      DVT prophylaxis:  Eliquis    Code Status: FULL CODE     TOC needs: TBD   Medical barriers to dispo: IV abx, Expected medical readiness for discharge tomorrow     Subjective Patient seen and examined at bedside this morning Denies nausea vomiting abdominal pain chest pain Was initially planned for discharge today however when PT OT went in to later within the day patient was found to be very lethargic.   Family Communication: pt's husband and pt's daughters at bedside on rounds    Physical Exam Constitutional:      General: She is not in acute distress. Cardiovascular:     Rate and Rhythm: Normal rate and regular rhythm.     Heart sounds: Murmur heard.  Pulmonary:     Effort: Pulmonary effort is normal.     Breath sounds: Normal breath sounds.  Abdominal:     Palpations: Abdomen is soft.  Musculoskeletal:     Right lower leg: No edema.     Left lower leg: No edema.  Skin:    General: Skin is warm and dry.  Neurological:     Mental Status: She is alert and oriented to person, place, and time. Mental status is at baseline.  Psychiatric:        Mood and Affect: Mood normal.        Behavior: Behavior normal.    Data reviewed:    Latest Ref Rng & Units 08/29/2024    6:21 AM 08/26/2024    4:31 AM 08/25/2024   10:48 AM  CBC  WBC 4.0 - 10.5 K/uL 6.6  7.9  9.7   Hemoglobin 12.0 - 15.0 g/dL  7.7  8.2  9.9   Hematocrit 36.0 - 46.0 % 22.3  24.1  29.8   Platelets 150 - 400 K/uL 153  108  139       Latest Ref Rng & Units 08/29/2024    6:21 AM 08/28/2024   11:53 AM 08/26/2024    4:31 AM  BMP  Glucose 70 - 99 mg/dL 88  868  891   BUN 8 - 23 mg/dL 30  32  36   Creatinine 0.44 - 1.00 mg/dL 7.90  7.58  7.59   Sodium 135 - 145 mmol/L 144  140  137   Potassium 3.5 - 5.1 mmol/L 3.7  3.2  3.4   Chloride 98 - 111 mmol/L 113  105  106   CO2 22 - 32 mmol/L 23  24  21    Calcium  8.9 - 10.3 mg/dL 8.5  8.8  8.3      Vitals:   08/29/24 0859 08/29/24 1100 08/29/24 1348 08/29/24 1649  BP: (!) 155/62   (!) 146/68 136/65  Pulse: (!) 51  (!) 58 (!) 58  Resp: 19   19  Temp: 98.1 F (36.7 C)   98.5 F (36.9 C)  TempSrc: Oral   Oral  SpO2: 98% 98% 98% 99%  Weight:      Height:          Latest Ref Rng & Units 08/29/2024    6:21 AM 08/26/2024    4:31 AM 08/25/2024   10:48 AM  CBC  WBC 4.0 - 10.5 K/uL 6.6  7.9  9.7   Hemoglobin 12.0 - 15.0 g/dL 7.7  8.2  9.9   Hematocrit 36.0 - 46.0 % 22.3  24.1  29.8   Platelets 150 - 400 K/uL 153  108  139        Latest Ref Rng & Units 08/29/2024    6:21 AM 08/28/2024   11:53 AM 08/26/2024    4:31 AM  BMP  Glucose 70 - 99 mg/dL 88  868  891   BUN 8 - 23 mg/dL 30  32  36   Creatinine 0.44 - 1.00 mg/dL 7.90  7.58  7.59   Sodium 135 - 145 mmol/L 144  140  137   Potassium 3.5 - 5.1 mmol/L 3.7  3.2  3.4   Chloride 98 - 111 mmol/L 113  105  106   CO2 22 - 32 mmol/L 23  24  21    Calcium  8.9 - 10.3 mg/dL 8.5  8.8  8.3      Author: Drue ONEIDA Potter, MD 08/29/2024 5:34 PM  For on call review [email or web address]

## 2024-08-29 NOTE — Progress Notes (Signed)
 Physical Therapy Treatment Patient Details Name: LUNELL ROBART MRN: [id number] DOB: [birth year] Today's Date: 08/29/2024   History of Present Illness Pt is a 83 year old female who presents ED for chief concerns of altered mental status and fever. Current MD assessment: complicated UTI. PMH of dementia, atrial fibrillation on Eliquis , CKD stage IV, rheumatoid arthritis, hypertension, hyperlipidemia, history of left hepatic lobe cyst.    PT Comments  Pt received in bed this am slightly more awake, however not as alert compared to 2 days ago. Progressed OOB activity and attempted gait training with RW. Pt w/ c/o dizziness in standing and not feeling well enough to ambulate. Vitals assessed, pt with + drop in BP from sit to stand. MD/Nursing notified and recorded findings in chart. Pt assisted to bedside recliner with all needs met. Remains fatigued and weak. MD placed order for IVF. Will reassess next available date/time per POC.    If plan is discharge home, recommend the following: A little help with walking and/or transfers;A little help with bathing/dressing/bathroom;Assistance with cooking/housework;Direct supervision/assist for medications management;Direct supervision/assist for financial management;Assist for transportation;Help with stairs or ramp for entrance   Can travel by private vehicle        Equipment Recommendations  Rolling walker (2 wheels);BSC/3in1;Other (comment) (Double check with family if they have these DME's)    Recommendations for Other Services       Precautions / Restrictions Precautions Precautions: Fall Recall of Precautions/Restrictions: Impaired Restrictions Weight Bearing Restrictions Per Provider Order: No     Mobility  Bed Mobility Overal bed mobility: Needs Assistance Bed Mobility: Supine to Sit     Supine to sit: Mod assist     General bed mobility comments: Significant increased assist for bed mobility.    Transfers Overall transfer  level: Needs assistance Equipment used: Rolling walker (2 wheels) Transfers: Sit to/from Stand Sit to Stand: Min assist, Contact guard assist           General transfer comment:  (Several sit<>stand transfers from edge of bed.)    Ambulation/Gait Ambulation/Gait assistance: Contact guard assist Gait Distance (Feet): 4 Feet Assistive device: Rolling walker (2 wheels) Gait Pattern/deviations: Step-through pattern, Decreased stride length, Shuffle, Narrow base of support Gait velocity: decreased     General Gait Details:  (Pt ambulated forward/back with RW due to feeling dizzy in standing.)   Stairs             Wheelchair Mobility     Tilt Bed    Modified Rankin (Stroke Patients Only)       Balance Overall balance assessment: Needs assistance Sitting-balance support: Feet supported Sitting balance-Leahy Scale: Good     Standing balance support: Bilateral upper extremity supported, During functional activity, Reliant on assistive device for balance Standing balance-Leahy Scale: Fair Standing balance comment:  (Reliant on RW for balance)                            Communication Communication Communication: Impaired Factors Affecting Communication: Hearing impaired  Cognition Arousal: Lethargic Behavior During Therapy: WFL for tasks assessed/performed                             Following commands: Impaired Following commands impaired: Follows one step commands with increased time, Follows one step commands inconsistently    Cueing Cueing Techniques: Verbal cues, Tactile cues, Visual cues  Exercises Other Exercises Other Exercises:  (Pt and family educated  on concerns for d/c with current lethargy and BP symptomatic readings with good understanding)    General Comments General comments (skin integrity, edema, etc.): RN in room to assess vitials      Pertinent Vitals/Pain Pain Assessment Pain Assessment: No/denies pain     Home Living                          Prior Function            PT Goals (current goals can now be found in the care plan section) Acute Rehab PT Goals Patient Stated Goal: to go home    Frequency    Min 2X/week      PT Plan      Co-evaluation              AM-PAC PT 6 Clicks Mobility   Outcome Measure  Help needed turning from your back to your side while in a flat bed without using bedrails?: A Lot Help needed moving from lying on your back to sitting on the side of a flat bed without using bedrails?: A Lot Help needed moving to and from a bed to a chair (including a wheelchair)?: A Little Help needed standing up from a chair using your arms (e.g., wheelchair or bedside chair)?: A Little Help needed to walk in hospital room?: A Little Help needed climbing 3-5 steps with a railing? : A Little 6 Click Score: 16    End of Session Equipment Utilized During Treatment: Gait belt Activity Tolerance: Patient limited by lethargy Patient left: in chair;with call bell/phone within reach;with family/visitor present Nurse Communication: Mobility status;Other (comment) PT Visit Diagnosis: Other abnormalities of gait and mobility (R26.89);Muscle weakness (generalized) (M62.81)     Time: 1110-1150 PT Time Calculation (min) (ACUTE ONLY): 40 min  Charges:    $Gait Training: 8-22 mins $Therapeutic Activity: 23-37 mins PT General Charges $$ ACUTE PT VISIT: 1 Visit                    Darice Bohr, PTA  Darice JAYSON Bohr 08/29/2024, 3:24 PM

## 2024-08-29 NOTE — Plan of Care (Signed)
  Problem: Clinical Measurements: Goal: Diagnostic test results will improve Outcome: Progressing   Problem: Respiratory: Goal: Ability to maintain adequate ventilation will improve Outcome: Progressing   Problem: Health Behavior/Discharge Planning: Goal: Ability to manage health-related needs will improve Outcome: Progressing   Problem: Clinical Measurements: Goal: Cardiovascular complication will be avoided Outcome: Progressing

## 2024-08-29 NOTE — Progress Notes (Signed)
 Occupational Therapy Treatment Patient Details Name: [name] MRN: [id number] DOB: 1941/04/13 Today's Date: 08/29/2024   History of present illness Pt is a 83 year old female who presents ED for chief concerns of altered mental status and fever. Current MD assessment: complicated UTI. PMH of dementia, atrial fibrillation on Eliquis , CKD stage IV, rheumatoid arthritis, hypertension, hyperlipidemia, history of left hepatic lobe cyst.   OT comments  Pt seen for OT treatment on this date. Upon arrival to room pt seated in recliner with daughter at bedside, agreeable to tx. Pt requires CGA STS from recliner with verbal cues for hand placement with DME. Pt required setupA for LB bathing and daughter assisted with tray setup as pt was eager for her lunches arrival. Pt returned to the recliner post bathing tasks with CGA throughout for safety. Pt making good progress toward goals, will continue to follow POC. Discharge recommendation remains appropriate.        If plan is discharge home, recommend the following:  A little help with walking and/or transfers;A little help with bathing/dressing/bathroom;Help with stairs or ramp for entrance;Assist for transportation;Supervision due to cognitive status;Direct supervision/assist for medications management;Direct supervision/assist for financial management   Equipment Recommendations  BSC/3in1;Other (comment)    Recommendations for Other Services      Precautions / Restrictions Precautions Precautions: Fall Recall of Precautions/Restrictions: Impaired Restrictions Weight Bearing Restrictions Per Provider Order: No       Mobility Bed Mobility               General bed mobility comments: NT pt in recliner pre/post session    Transfers Overall transfer level: Needs assistance Equipment used: Rolling walker (2 wheels) Transfers: Sit to/from Stand Sit to Stand: Contact guard assist           General transfer comment: STS from  recliner, CGA with verbal cues for hand placement and DME management     Balance Overall balance assessment: Needs assistance Sitting-balance support: Feet supported Sitting balance-Leahy Scale: Good Sitting balance - Comments: steady static and dynamic sitting   Standing balance support: Bilateral upper extremity supported Standing balance-Leahy Scale: Fair                             ADL either performed or assessed with clinical judgement   ADL Overall ADL's : Needs assistance/impaired Eating/Feeding: Set up;Sitting   Grooming: Wash/dry hands;Wash/dry face;Set up;Sitting       Lower Body Bathing: Contact guard assist;Sit to/from stand       Lower Body Dressing: Maximal assistance Lower Body Dressing Details (indicate cue type and reason): Daughter don/doffed bil socks     Toileting- Clothing Manipulation and Hygiene: Set up;Sit to/from stand       Functional mobility during ADLs: Contact guard assist;Rolling walker (2 wheels) General ADL Comments: Daughter at bedside assisting with ADLs (LB dressing, tray seup)    Communication Communication Communication: Impaired Factors Affecting Communication: Hearing impaired   Cognition Arousal: Lethargic Behavior During Therapy: WFL for tasks assessed/performed Cognition: Cognition impaired       Memory impairment (select all impairments): Short-term memory   Executive functioning impairment (select all impairments): Initiation, Problem solving OT - Cognition Comments: A/Ox2                 Following commands: Impaired Following commands impaired: Follows one step commands with increased time, Follows one step commands inconsistently      Cueing   Cueing Techniques: Verbal cues, Tactile  cues, Visual cues  Exercises Exercises: Other exercises Other Exercises Other Exercises: Edu: Role of OT, safe ADL completion, benefits of OOB mobility           General Comments RN in room to assess  vitials    Pertinent Vitals/ Pain       Pain Assessment Pain Assessment: No/denies pain                                                          Frequency  Min 2X/week        Progress Toward Goals  OT Goals(current goals can now be found in the care plan section)  Progress towards OT goals: Progressing toward goals  Acute Rehab OT Goals OT Goal Formulation: With patient/family Time For Goal Achievement: 09/09/24 Potential to Achieve Goals: Good ADL Goals Pt Will Perform Grooming: with modified independence;standing Pt Will Perform Lower Body Dressing: with modified independence;sitting/lateral leans;sit to/from stand;with supervision Pt Will Transfer to Toilet: with modified independence;with supervision;ambulating   AM-PAC OT 6 Clicks Daily Activity     Outcome Measure   Help from another person eating meals?: None Help from another person taking care of personal grooming?: A Little Help from another person toileting, which includes using toliet, bedpan, or urinal?: A Little Help from another person bathing (including washing, rinsing, drying)?: A Little Help from another person to put on and taking off regular upper body clothing?: None Help from another person to put on and taking off regular lower body clothing?: A Little 6 Click Score: 20    End of Session Equipment Utilized During Treatment: Rolling walker (2 wheels);Gait belt  OT Visit Diagnosis: Muscle weakness (generalized) (M62.81);Other abnormalities of gait and mobility (R26.89)   Activity Tolerance Patient tolerated treatment well   Patient Left with family/visitor present;with call bell/phone within reach;with bed alarm set;in chair;with nursing/sitter in room   Nurse Communication Mobility status        Time: 8661-8646 OT Time Calculation (min): 15 min  Charges: OT General Charges $OT Visit: 1 Visit OT Treatments $Self Care/Home Management : 8-22 mins  Larraine Colas M.S. OTR/L  08/29/24, 2:12 PM

## 2024-08-30 ENCOUNTER — Other Ambulatory Visit: Payer: Self-pay

## 2024-08-30 DIAGNOSIS — N390 Urinary tract infection, site not specified: Secondary | ICD-10-CM | POA: Diagnosis not present

## 2024-08-30 LAB — CBC WITH DIFFERENTIAL/PLATELET
Abs Immature Granulocytes: 0.08 K/uL — ABNORMAL HIGH (ref 0.00–0.07)
Basophils Absolute: 0 K/uL (ref 0.0–0.1)
Basophils Relative: 1 %
Eosinophils Absolute: 0.2 K/uL (ref 0.0–0.5)
Eosinophils Relative: 2 %
HCT: 23.5 % — ABNORMAL LOW (ref 36.0–46.0)
Hemoglobin: 7.6 g/dL — ABNORMAL LOW (ref 12.0–15.0)
Immature Granulocytes: 1 %
Lymphocytes Relative: 27 %
Lymphs Abs: 1.7 K/uL (ref 0.7–4.0)
MCH: 29.7 pg (ref 26.0–34.0)
MCHC: 32.3 g/dL (ref 30.0–36.0)
MCV: 91.8 fL (ref 80.0–100.0)
Monocytes Absolute: 0.8 K/uL (ref 0.1–1.0)
Monocytes Relative: 12 %
Neutro Abs: 3.5 K/uL (ref 1.7–7.7)
Neutrophils Relative %: 57 %
Platelets: 189 K/uL (ref 150–400)
RBC: 2.56 MIL/uL — ABNORMAL LOW (ref 3.87–5.11)
RDW: 14.2 % (ref 11.5–15.5)
WBC: 6.2 K/uL (ref 4.0–10.5)
nRBC: 0 % (ref 0.0–0.2)

## 2024-08-30 LAB — BASIC METABOLIC PANEL WITH GFR
Anion gap: 6 (ref 5–15)
BUN: 27 mg/dL — ABNORMAL HIGH (ref 8–23)
CO2: 24 mmol/L (ref 22–32)
Calcium: 8.3 mg/dL — ABNORMAL LOW (ref 8.9–10.3)
Chloride: 113 mmol/L — ABNORMAL HIGH (ref 98–111)
Creatinine, Ser: 2.01 mg/dL — ABNORMAL HIGH (ref 0.44–1.00)
GFR, Estimated: 24 mL/min — ABNORMAL LOW (ref >60)
Glucose, Bld: 104 mg/dL — ABNORMAL HIGH (ref 70–99)
Potassium: 3.6 mmol/L (ref 3.5–5.1)
Sodium: 143 mmol/L (ref 135–145)

## 2024-08-30 MED ORDER — ONDANSETRON HCL 4 MG PO TABS: 4.0000 mg | ORAL_TABLET | Freq: Four times a day (QID) | ORAL | Status: DC | PRN

## 2024-08-30 MED ORDER — HYDROCODONE-ACETAMINOPHEN 5-325 MG PO TABS
1.0000 | ORAL_TABLET | ORAL | Status: DC | PRN
  Administered 2024-08-30 – 2024-08-31 (×2): 2 via ORAL
  Filled 2024-08-30 (×2): qty 2

## 2024-08-30 MED ORDER — SODIUM CHLORIDE 0.9 % IV SOLN: INTRAVENOUS | Status: AC

## 2024-08-30 MED ORDER — ACETAMINOPHEN 650 MG RE SUPP: 650.0000 mg | Freq: Four times a day (QID) | RECTAL | Status: DC | PRN

## 2024-08-30 MED ORDER — ACETAMINOPHEN 325 MG PO TABS: 650.0000 mg | ORAL_TABLET | Freq: Four times a day (QID) | ORAL | Status: DC | PRN

## 2024-08-30 MED ORDER — ONDANSETRON HCL 4 MG/2ML IJ SOLN: 4.0000 mg | Freq: Four times a day (QID) | INTRAMUSCULAR | Status: DC | PRN

## 2024-08-30 NOTE — Plan of Care (Signed)
  Problem: Fluid Volume: Goal: Hemodynamic stability will improve Outcome: Progressing   Problem: Clinical Measurements: Goal: Diagnostic test results will improve Outcome: Progressing   Problem: Clinical Measurements: Goal: Signs and symptoms of infection will decrease Outcome: Progressing   Problem: Education: Goal: Knowledge of General Education information will improve Description: Including pain rating scale, medication(s)/side effects and non-pharmacologic comfort measures Outcome: Progressing   Problem: Health Behavior/Discharge Planning: Goal: Ability to manage health-related needs will improve Outcome: Progressing

## 2024-08-30 NOTE — Progress Notes (Signed)
 Physical Therapy Treatment Patient Details Name: [name] MRN: [id number] DOB: [birth year] Today's Date: 08/30/2024   History of Present Illness Pt is a 83 year old female who presents ED for chief concerns of altered mental status and fever. Current MD assessment: complicated UTI. PMH of dementia, atrial fibrillation on Eliquis , CKD stage IV, rheumatoid arthritis, hypertension, hyperlipidemia, history of left hepatic lobe cyst.    PT Comments  Pt was long sitting in bed upon arrival. Supportive spouse at bedside. Pt is A and O x 2, has baseline cognitive deficits.Per spouse, more alert today.  Agreeable to PT session. In supine BP 152/61 (87). In sitting 130/85. After standing 110/62(57) pt endorses symptoms that resolved somewhat however  I feel woozy and feels like the room is spinning. Pt was unable to progress away form EOB due to safety concerns. Pt required much more extensive physical assistance to safely exit bed, stand, and then to return to bed after EOB activity. Due to lack of progress and symptom concerns. DC recs updated to SNF. MD aware of concerns. Will changed recs back to [facility] if pt demonstrates improved activity tolerance + improved response to OOB activity.    If plan is discharge home, recommend the following: A little help with walking and/or transfers;A little help with bathing/dressing/bathroom;Assistance with cooking/housework;Direct supervision/assist for medications management;Direct supervision/assist for financial management;Assist for transportation;Help with stairs or ramp for entrance     Equipment Recommendations  Rolling walker (2 wheels);BSC/3in1       Precautions / Restrictions Precautions Precautions: Fall Recall of Precautions/Restrictions: Impaired Restrictions Weight Bearing Restrictions Per Provider Order: No     Mobility  Bed Mobility Overal bed mobility: Needs Assistance Bed Mobility: Supine to Sit  Supine to sit: Mod assist Sit to  supine: Min assist, HOB elevated     Transfers Overall transfer level: Needs assistance Equipment used: Rolling walker (2 wheels) Transfers: Sit to/from Stand Sit to Stand: Mod assist, Min assist  General transfer comment: first STS mod assist. with entensive vcing required min assist on 2nd attempt    Ambulation/Gait  General Gait Details: Unable to progress away from EOB due to symptoms of dizziness and whooziness    Balance Overall balance assessment: Needs assistance Sitting-balance support: Feet supported Sitting balance-Leahy Scale: Fair Sitting balance - Comments: requires close supervision due to LOB concerns 2/2 to dizziness/orthostatic hypotensive symptoms   Standing balance support: Bilateral upper extremity supported, During functional activity, Reliant on assistive device for balance Standing balance-Leahy Scale: Fair Standing balance comment: reliant on AD in standing. High fall risk with current presentation of hypotensive response to change in positions     Communication Communication Communication: Impaired Factors Affecting Communication: Hearing impaired  Cognition Arousal: Alert Behavior During Therapy: WFL for tasks assessed/performed   PT - Cognitive impairments: History of cognitive impairments    Following commands: Impaired Following commands impaired: Follows one step commands with increased time    Cueing Cueing Techniques: Verbal cues, Tactile cues         Pertinent Vitals/Pain Pain Assessment Pain Assessment:  (c/o chronic LBP) Pain Score:  (did not give formal rating) Pain Location: LBP Pain Descriptors / Indicators: Aching Pain Intervention(s): Monitored during session, Premedicated before session, Limited activity within patient's tolerance, Repositioned     PT Goals (current goals can now be found in the care plan section) Acute Rehab PT Goals Patient Stated Goal: get better Progress towards PT goals: Not progressing toward goals  - comment    Frequency    Min  2X/week       AM-PAC PT 6 Clicks Mobility   Outcome Measure  Help needed turning from your back to your side while in a flat bed without using bedrails?: A Lot Help needed moving from lying on your back to sitting on the side of a flat bed without using bedrails?: A Lot Help needed moving to and from a bed to a chair (including a wheelchair)?: A Lot Help needed standing up from a chair using your arms (e.g., wheelchair or bedside chair)?: A Lot Help needed to walk in hospital room?: A Lot Help needed climbing 3-5 steps with a railing? : A Lot 6 Click Score: 12    End of Session Equipment Utilized During Treatment: Gait belt Activity Tolerance: Treatment limited secondary to medical complications (Comment) (limited by hypotensive response to activity) Patient left: in bed;with call bell/phone within reach;with bed alarm set;with family/visitor present Nurse Communication: Mobility status;Other (comment) (informed MD, RN and TOC of change in recommendation and concerns with pt presentation.) PT Visit Diagnosis: Other abnormalities of gait and mobility (R26.89);Muscle weakness (generalized) (M62.81)     Time: 1010-1033 PT Time Calculation (min) (ACUTE ONLY): 23 min  Charges:    $Therapeutic Activity: 23-37 mins PT General Charges $$ ACUTE PT VISIT: 1 Visit                    Rankin Essex PTA 08/30/24, 11:16 AM

## 2024-08-30 NOTE — Progress Notes (Signed)
 Progress Note   Patient: [name] FMW:[id number] DOB: [birth year] DOA: 08/25/2024     5 DOS: the patient was seen and examined on 08/30/2024      Brief hospital course:    [name] is a 83 year old female with history of dementia, atrial fibrillation on Eliquis , CKD stage IV, rheumatoid arthritis, hypertension, hyperlipidemia, history of left hepatic lobe cyst, who presents ED for chief concerns of altered mental status and fever.   11/16: to ED. Tmax 102. Encephalopathic. Broad abx cefepime  + vanc + flagyl . Admitted to hospitalist 11/17: (+)Ecoli bacteremia, abx to ceftriaxone . Pt mental status improved/baseline. Continue abx await susceptibilities  11/18: UCx susceptibilities resulted, await BCx susceptibilities per ID pharmacist recs to guide final abx but hopefully will be able to dc on amox or keflex  unless come back with higher MIC on blood cx          Consultants:  none   Procedures/Surgeries: none           ASSESSMENT & PLAN:   Complicated UTI (urinary tract infection) (+)Ecoli bacteremia Sepsis ruled out  Metabolic encephalopathy d/t UTI in elderly patient - resolved  Blood culture x 2 --> (+)Ecoli, susceptibilities pending  urine culture (+)Ecoli, pansensitive dc cefepime , vancomycin , metronidazole  --> started ceftriaxone  and transition to amoxicillin  to complete 1 g twice daily for 4 more days Discussed with ID pharmacist    CKD (chronic kidney disease) stage 4, GFR 15-29 ml/min  At baseline Monitor BMP   GERD (gastroesophageal reflux disease) PPI   Essential hypertension Orthostatic hypotension  Cardura  discontinued TED stockings   HYPERLIPIDEMIA Rosuvastatin  10 mg nightly resume   Atrial fibrillation Eliquis  Continue amiodarone    Mild cognitive impairment Hospital delirium Insomnia Ativan  prn Seroquel  at bedtime prn      DVT prophylaxis: Eliquis    Code Status: FULL CODE     TOC needs: TBD   Medical barriers  to dispo: IV abx, Expected medical readiness for discharge tomorrow     Subjective Patient seen and examined at bedside this morning Denies nausea vomiting abdominal pain chest pain Was initially planned for discharge today however when PT OT went in to later within the day patient was found to be very lethargic.   Family Communication: pt's husband and pt's daughters at bedside on rounds    Physical Exam Constitutional:      General: She is not in acute distress. Cardiovascular:     Rate and Rhythm: Normal rate and regular rhythm.     Heart sounds: Murmur heard.  Pulmonary:     Effort: Pulmonary effort is normal.     Breath sounds: Normal breath sounds.  Abdominal:     Palpations: Abdomen is soft.  Musculoskeletal:     Right lower leg: No edema.     Left lower leg: No edema.  Skin:    General: Skin is warm and dry.  Neurological:     Mental Status: She is alert and oriented to person, place, and time. Mental status is at baseline.  Psychiatric:        Mood and Affect: Mood normal.        Behavior: Behavior normal.    Data reviewed:     Latest Ref Rng & Units 08/30/2024    4:34 AM 08/29/2024    6:21 AM 08/26/2024    4:31 AM  CBC  WBC 4.0 - 10.5 K/uL 6.2  6.6  7.9   Hemoglobin 12.0 - 15.0 g/dL 7.6  7.7  8.2   Hematocrit  36.0 - 46.0 % 23.5  22.3  24.1   Platelets 150 - 400 K/uL 189  153  108        Latest Ref Rng & Units 08/30/2024    4:34 AM 08/29/2024    6:21 AM 08/28/2024   11:53 AM  BMP  Glucose 70 - 99 mg/dL 895  88  868   BUN 8 - 23 mg/dL 27  30  32   Creatinine 0.44 - 1.00 mg/dL 7.98  7.90  7.58   Sodium 135 - 145 mmol/L 143  144  140   Potassium 3.5 - 5.1 mmol/L 3.6  3.7  3.2   Chloride 98 - 111 mmol/L 113  113  105   CO2 22 - 32 mmol/L 24  23  24    Calcium  8.9 - 10.3 mg/dL 8.3  8.5  8.8     Vitals:   08/29/24 2034 08/30/24 0344 08/30/24 0835 08/30/24 1603  BP: 137/68 124/63 130/63 136/89  Pulse: (!) 57 (!) 57 (!) 57 (!) 59  Resp: 18  18 16    Temp: 98.1 F (36.7 C) 98.6 F (37 C) 98.2 F (36.8 C) 98.6 F (37 C)  TempSrc: Oral Oral  Oral  SpO2: 99% 97% 100% 100%  Weight:      Height:          Author: Drue ONEIDA Potter, MD 08/30/2024 5:44 PM  For on call review [email or web address].

## 2024-08-31 DIAGNOSIS — N390 Urinary tract infection, site not specified: Secondary | ICD-10-CM | POA: Diagnosis not present

## 2024-08-31 LAB — CBC WITH DIFFERENTIAL/PLATELET
Abs Immature Granulocytes: 0.08 K/uL — ABNORMAL HIGH (ref 0.00–0.07)
Basophils Absolute: 0 K/uL (ref 0.0–0.1)
Basophils Relative: 1 %
Eosinophils Absolute: 0.2 K/uL (ref 0.0–0.5)
Eosinophils Relative: 3 %
HCT: 22.3 % — ABNORMAL LOW (ref 36.0–46.0)
Hemoglobin: 7.5 g/dL — ABNORMAL LOW (ref 12.0–15.0)
Immature Granulocytes: 1 %
Lymphocytes Relative: 30 %
Lymphs Abs: 1.8 K/uL (ref 0.7–4.0)
MCH: 30.7 pg (ref 26.0–34.0)
MCHC: 33.6 g/dL (ref 30.0–36.0)
MCV: 91.4 fL (ref 80.0–100.0)
Monocytes Absolute: 0.7 K/uL (ref 0.1–1.0)
Monocytes Relative: 11 %
Neutro Abs: 3.2 K/uL (ref 1.7–7.7)
Neutrophils Relative %: 54 %
Platelets: 200 K/uL (ref 150–400)
RBC: 2.44 MIL/uL — ABNORMAL LOW (ref 3.87–5.11)
RDW: 14.2 % (ref 11.5–15.5)
Smear Review: NORMAL
WBC: 5.9 K/uL (ref 4.0–10.5)
nRBC: 0 % (ref 0.0–0.2)

## 2024-08-31 LAB — BASIC METABOLIC PANEL WITH GFR
Anion gap: 9 (ref 5–15)
BUN: 23 mg/dL (ref 8–23)
CO2: 24 mmol/L (ref 22–32)
Calcium: 8.2 mg/dL — ABNORMAL LOW (ref 8.9–10.3)
Chloride: 110 mmol/L (ref 98–111)
Creatinine, Ser: 1.91 mg/dL — ABNORMAL HIGH (ref 0.44–1.00)
GFR, Estimated: 26 mL/min — ABNORMAL LOW (ref >60)
Glucose, Bld: 94 mg/dL (ref 70–99)
Potassium: 3.7 mmol/L (ref 3.5–5.1)
Sodium: 142 mmol/L (ref 135–145)

## 2024-08-31 NOTE — Progress Notes (Signed)
 Progress Note   Patient: [name] FMW:[id number] DOB: 18-Aug-1941 DOA: 08/25/2024     6 DOS: the patient was seen and examined on 08/31/2024      Brief hospital course:    Ms. Jenasis Straley is a 83 year old female with history of dementia, atrial fibrillation on Eliquis , CKD stage IV, rheumatoid arthritis, hypertension, hyperlipidemia, history of left hepatic lobe cyst, who presents ED for chief concerns of altered mental status and fever.   11/16: to ED. Tmax 102. Encephalopathic. Broad abx cefepime  + vanc + flagyl . Admitted to hospitalist 11/17: (+)Ecoli bacteremia, abx to ceftriaxone . Pt mental status improved/baseline. Continue abx await susceptibilities  11/18: UCx susceptibilities resulted, await BCx susceptibilities per ID pharmacist recs to guide final abx but hopefully will be able to dc on amox or keflex  unless come back with higher MIC on blood cx          Consultants:  none   Procedures/Surgeries: none           ASSESSMENT & PLAN:   Complicated UTI (urinary tract infection) (+)Ecoli bacteremia Sepsis ruled out  Metabolic encephalopathy d/t UTI in elderly patient - resolved  Blood culture x 2 --> (+)Ecoli, susceptibilities pending  urine culture (+)Ecoli, pansensitive dc cefepime , vancomycin , metronidazole  --> started ceftriaxone  and transition to amoxicillin  to complete 1 g twice daily for 4 more days Discussed with ID pharmacist    CKD (chronic kidney disease) stage 4, GFR 15-29 ml/min  At baseline Monitor BMP   GERD (gastroesophageal reflux disease) PPI   Essential hypertension Orthostatic hypotension   Cardura  discontinued TED stockings   HYPERLIPIDEMIA Rosuvastatin  10 mg nightly resume   Atrial fibrillation Eliquis  Continue amiodarone    Mild cognitive impairment Hospital delirium Insomnia Ativan  prn Seroquel  at bedtime prn      DVT prophylaxis: Eliquis    Code Status: FULL CODE     TOC needs: TBD   Medical barriers  to dispo: IV abx, Expected medical readiness for discharge tomorrow     Subjective Patient admits to improvement in her generalized weakness No acute overnight events reported   Family Communication: pt's husband and pt's daughters at bedside on rounds    Physical Exam Constitutional:      General: She is not in acute distress. Cardiovascular:     Rate and Rhythm: Normal rate and regular rhythm.     Heart sounds: Murmur heard.  Pulmonary:     Effort: Pulmonary effort is normal.     Breath sounds: Normal breath sounds.  Abdominal:     Palpations: Abdomen is soft.  Musculoskeletal:     Right lower leg: No edema.     Left lower leg: No edema.  Skin:    General: Skin is warm and dry.  Neurological:     Mental Status: She is alert and oriented to person, place, and time. Mental status is at baseline.  Psychiatric:        Mood and Affect: Mood normal.        Behavior: Behavior normal.    Data reviewed: Vitals:   08/31/24 0941 08/31/24 0950 08/31/24 1415 08/31/24 1504  BP: (!) 132/56 120/64 (!) 126/54 125/61  Pulse: 63 74  63  Resp: 16 14  14   Temp: 98.4 F (36.9 C) 98.8 F (37.1 C)  98.2 F (36.8 C)  TempSrc: Oral   Oral  SpO2: 98% 97%  99%  Weight:      Height:           Latest Ref Rng &  Units 08/31/2024    5:34 AM 08/30/2024    4:34 AM 08/29/2024    6:21 AM  BMP  Glucose 70 - 99 mg/dL 94  895  88   BUN 8 - 23 mg/dL 23  27  30    Creatinine 0.44 - 1.00 mg/dL 8.08  7.98  7.90   Sodium 135 - 145 mmol/L 142  143  144   Potassium 3.5 - 5.1 mmol/L 3.7  3.6  3.7   Chloride 98 - 111 mmol/L 110  113  113   CO2 22 - 32 mmol/L 24  24  23    Calcium  8.9 - 10.3 mg/dL 8.2  8.3  8.5      Author: Drue ONEIDA Potter, MD 08/31/2024 5:38 PM  For on call review [email or web address].

## 2024-08-31 NOTE — Plan of Care (Signed)

## 2024-08-31 NOTE — Progress Notes (Signed)
 PT Cancellation Note  Patient Details Name: [name] MRN: [id number] DOB: 1941-03-22   Cancelled Treatment:     PT attempt. Pt was sitting in recliner upon arrival. She endorses feeling better and that she has not been getting dizzy. Encouraged pt to participate in PT session however pt unwilling at this time.  I might do it later. I just don't want to right now.    Rankin KATHEE Essex 08/31/2024, 12:21 PM

## 2024-09-01 ENCOUNTER — Other Ambulatory Visit: Payer: Self-pay

## 2024-09-01 DIAGNOSIS — N390 Urinary tract infection, site not specified: Secondary | ICD-10-CM | POA: Diagnosis not present

## 2024-09-01 LAB — CBC WITH DIFFERENTIAL/PLATELET
Abs Immature Granulocytes: 0.07 K/uL (ref 0.00–0.07)
Basophils Absolute: 0 K/uL (ref 0.0–0.1)
Basophils Relative: 1 %
Eosinophils Absolute: 0.1 K/uL (ref 0.0–0.5)
Eosinophils Relative: 2 %
HCT: 22.2 % — ABNORMAL LOW (ref 36.0–46.0)
Hemoglobin: 7.3 g/dL — ABNORMAL LOW (ref 12.0–15.0)
Immature Granulocytes: 1 %
Lymphocytes Relative: 32 %
Lymphs Abs: 1.9 K/uL (ref 0.7–4.0)
MCH: 30 pg (ref 26.0–34.0)
MCHC: 32.9 g/dL (ref 30.0–36.0)
MCV: 91.4 fL (ref 80.0–100.0)
Monocytes Absolute: 0.6 K/uL (ref 0.1–1.0)
Monocytes Relative: 10 %
Neutro Abs: 3.2 K/uL (ref 1.7–7.7)
Neutrophils Relative %: 54 %
Platelets: 228 K/uL (ref 150–400)
RBC: 2.43 MIL/uL — ABNORMAL LOW (ref 3.87–5.11)
RDW: 14.4 % (ref 11.5–15.5)
Smear Review: NORMAL
WBC: 6 K/uL (ref 4.0–10.5)
nRBC: 0 % (ref 0.0–0.2)

## 2024-09-01 LAB — BASIC METABOLIC PANEL WITH GFR
Anion gap: 6 (ref 5–15)
BUN: 23 mg/dL (ref 8–23)
CO2: 25 mmol/L (ref 22–32)
Calcium: 8.4 mg/dL — ABNORMAL LOW (ref 8.9–10.3)
Chloride: 113 mmol/L — ABNORMAL HIGH (ref 98–111)
Creatinine, Ser: 2.01 mg/dL — ABNORMAL HIGH (ref 0.44–1.00)
GFR, Estimated: 24 mL/min — ABNORMAL LOW (ref >60)
Glucose, Bld: 95 mg/dL (ref 70–99)
Potassium: 4.2 mmol/L (ref 3.5–5.1)
Sodium: 144 mmol/L (ref 135–145)

## 2024-09-01 MED ORDER — MELATONIN 10 MG PO CAPS: 10.0000 mg | ORAL_CAPSULE | Freq: Every day | ORAL | 0 refills | Status: AC

## 2024-09-01 MED ORDER — MELATONIN 10 MG PO CAPS: 10.0000 mg | ORAL_CAPSULE | Freq: Every day | ORAL | 0 refills | Status: DC

## 2024-09-01 MED ORDER — AMOXICILLIN 500 MG PO CAPS: 1000.0000 mg | ORAL_CAPSULE | Freq: Two times a day (BID) | ORAL | 0 refills | Status: AC

## 2024-09-01 MED ORDER — SENNOSIDES-DOCUSATE SODIUM 8.6-50 MG PO TABS: 1.0000 | ORAL_TABLET | Freq: Every evening | ORAL | 0 refills | Status: AC | PRN

## 2024-09-01 NOTE — Discharge Summary (Signed)
 Physician Discharge Summary   Patient: [name] MRN: [id number] DOB: Sep 02, 1941  Admit date:     08/25/2024  Discharge date: 09/01/24  Discharge Physician: Drue ONEIDA Potter   PCP: Avelina Greig BRAVO, MD   Recommendations at discharge:  Follow-up with PCP  Discharge Diagnoses: Principal Problem:   Complicated UTI (urinary tract infection) Active Problems:   CKD (chronic kidney disease) stage 4, GFR 15-29 ml/min (HCC)   HYPERLIPIDEMIA   History of rheumatoid arthritis   Essential hypertension   Moderate dementia (HCC)   GERD (gastroesophageal reflux disease)  Resolved Problems:   * No resolved hospital problems. [name] is a 83 year old female with history of dementia, atrial fibrillation on Eliquis , CKD stage IV, rheumatoid arthritis, hypertension, hyperlipidemia, history of left hepatic lobe cyst, who presents ED for chief concerns of altered mental status and fever.   11/16: to ED. Tmax 102. Encephalopathic. Broad abx cefepime  + vanc + flagyl . Admitted to hospitalist 11/17: (+)Ecoli bacteremia, abx to ceftriaxone . Pt mental status improved/baseline. Continue abx await susceptibilities  11/18: UCx susceptibilities resulted, await BCx susceptibilities per ID pharmacist recs to guide final abx but hopefully will be able to dc on amox or keflex  unless come back with higher MIC on blood cx    Other hospital course as noted below  Consultants:  none   Procedures/Surgeries: none   ASSESSMENT & PLAN:   Complicated UTI (urinary tract infection) (+)Ecoli bacteremia Sepsis ruled out  Metabolic encephalopathy d/t UTI in elderly patient - resolved  Blood culture x 2 --> (+)Ecoli, susceptibilities pending  urine culture (+)Ecoli, pansensitive dc cefepime , vancomycin , metronidazole  --> started ceftriaxone  and transitioned to amoxicillin  to complete 1 g twice daily to complete course Discussed with ID pharmacist    CKD (chronic kidney disease)  stage 4, GFR 15-29 ml/min  At baseline Monitor BMP   GERD (gastroesophageal reflux disease) PPI   Essential hypertension Orthostatic hypotension   Cardura  discontinued TED stockings   HYPERLIPIDEMIA Rosuvastatin  10 mg nightly resume   Atrial fibrillation Eliquis  Continue amiodarone    Mild cognitive impairment Hospital delirium Insomnia Ativan  prn Seroquel  at bedtime prn     Disposition: Home health Diet recommendation:  Cardiac diet DISCHARGE MEDICATION: Allergies as of 09/01/2024       Reactions   Amlodipine     Ankle swelling   Celecoxib Other (See Comments), Rash   Tachycardia/palpitations Other reaction(s): Other Tachycardia/palpitations Tachycardia/palpitations   Tramadol  Nausea Only        Medication List     STOP taking these medications    spironolactone  25 MG tablet Commonly known as: ALDACTONE        TAKE these medications    acetaminophen  325 MG tablet Commonly known as: TYLENOL  Take 1 tablet (325 mg total) by mouth every 6 (six) hours as needed for mild pain (pain score 1-3).   amiodarone  200 MG tablet Commonly known as: PACERONE  TAKE 1/2 TABLET (100 MG TOTAL) BY MOUTH ON MONDAY, TUESDAY, THURSDAY, FRIDAY ANDSATURDAY   amoxicillin  500 MG capsule Commonly known as: AMOXIL  Take 2 capsules (1,000 mg total) by mouth 2 (two) times daily for 4 days.   doxazosin  2 MG tablet Commonly known as: CARDURA  TAKE 1 TABLET BY MOUTH DAILY   Eliquis  2.5 MG Tabs tablet Generic drug: apixaban  TAKE 1 TABLET BY MOUTH TWICE A DAY   furosemide  20 MG tablet Commonly known as: LASIX  Take 0.5-1 tablets (10-20 mg total) by mouth daily as needed.   gabapentin  100 MG capsule Commonly  known as: NEURONTIN  Take 100 mg by mouth 2 (two) times daily.   hydrALAZINE  50 MG tablet Commonly known as: APRESOLINE  TAKE 1 TABLET BY MOUTH EVERY 8 HOURS.   lisinopril  10 MG tablet Commonly known as: ZESTRIL  Take 10 mg by mouth daily.   Lumigan 0.01 %  Soln Generic drug: bimatoprost Apply 1 drop to eye at bedtime.   methocarbamol  500 MG tablet Commonly known as: ROBAXIN  Take 1 tablet (500 mg total) by mouth 3 (three) times daily as needed for muscle spasms. Take 500 mg by mouth 3 (three) times daily.   ondansetron  4 MG tablet Commonly known as: Zofran  Take 1-2 tablets (4-8 mg total) by mouth every 8 (eight) hours as needed for nausea or vomiting.   pantoprazole  40 MG tablet Commonly known as: PROTONIX  TAKE 1 TABLET BY MOUTH DAILY   rosuvastatin  10 MG tablet Commonly known as: CRESTOR  TAKE 1 TABLET BY MOUTH DAILY   Simbrinza 1-0.2 % Susp Generic drug: Brinzolamide -Brimonidine  Place 1 drop into both eyes daily.   Stool Softener/Laxative 50-8.6 MG tablet Generic drug: senna-docusate Take 1 tablet by mouth at bedtime as needed for mild constipation.   sucralfate  1 g tablet Commonly known as: Carafate  Take 1 tablet (1 g total) by mouth 4 (four) times daily -  with meals and at bedtime.               Durable Medical Equipment  (From admission, onward)           Start     Ordered   09/01/24 1259  For home use only DME 4 wheeled rolling walker with seat  Once       Question:  Patient needs a walker to treat with the following condition  Answer:  Ambulatory dysfunction   09/01/24 1258            Contact information for follow-up providers     Avelina Greig BRAVO, MD Follow up.   Specialty: Family Medicine Why: Office closed at this time patient to make own follow up appt   hospital follow up Contact information: [address] [place] [address] [phone]              Contact information for after-discharge care     Home Medical Care     [facility]) .   Service: Home Health Services Contact information: [address] [place]  [phone]                    Discharge Exam: Fredricka Weights   08/25/24 1047   Weight: 59.6 kg     General: She is not in acute distress. Cardiovascular:     Rate and Rhythm: Normal rate and regular rhythm.     Heart sounds: Murmur heard.  Pulmonary:     Effort: Pulmonary effort is normal.     Breath sounds: Normal breath sounds.  Abdominal:     Palpations: Abdomen is soft.  Musculoskeletal:     Right lower leg: No edema.     Left lower leg: No edema.  Skin:    General: Skin is warm and dry.  Neurological:     Mental Status: She is alert and oriented to person, place, and time. Mental status is at baseline.  Psychiatric:        Mood and Affect: Mood normal.        Behavior: Behavior normal.  Condition at discharge:  good  The results of significant diagnostics from this hospitalization (including imaging, microbiology, ancillary and laboratory) are listed below for reference.   Imaging Studies: CT ABDOMEN PELVIS WO CONTRAST Result Date: 08/25/2024 EXAM: CT ABDOMEN AND PELVIS WITHOUT CONTRAST 08/25/2024 11:27:00 AM TECHNIQUE: CT of the abdomen and pelvis was performed without the administration of intravenous contrast. Multiplanar reformatted images are provided for review. Automated exposure control, iterative reconstruction, and/or weight-based adjustment of the mA/kV was utilized to reduce the radiation dose to as low as reasonably achievable. COMPARISON: Prior study from 06/19/2024. CLINICAL HISTORY: Abdominal pain, acute, nonlocalized. FINDINGS: LOWER CHEST: Small bilateral pleural effusions, bibasilar subsegmental atelectasis or minimal consolidation. LIVER: Left lobe hepatic cystic lesion now measures 5.5 cm compared to 6.4 cm on the prior study. A few additional subcentimeter hypodensities in the liver are likely cysts. GALLBLADDER AND BILE DUCTS: Gallbladder is unremarkable. No biliary ductal dilatation. SPLEEN: No acute abnormality. PANCREAS: No acute abnormality. ADRENAL GLANDS: No acute abnormality. KIDNEYS, URETERS AND BLADDER: Left kidney lesion  consistent with cyst measuring 4.8 cm. Possible mass arising exophytically from the inferior pole of the right kidney measuring 4.7 cm. Renal protocol CT or [name] recommended for further evaluation. No stones in the kidneys or ureters. No hydronephrosis. No perinephric or periureteral stranding. Urinary bladder is unremarkable. GI AND BOWEL: Stomach demonstrates no acute abnormality. There is no bowel obstruction. PERITONEUM AND RETROPERITONEUM: Small amount of free fluid in the right paracolic gutter and in the pelvis. No free air. VASCULATURE: Atheromatous calcifications of the aorta and its branches. LYMPH NODES: No lymphadenopathy. REPRODUCTIVE ORGANS: Status post hysterectomy. BONES AND SOFT TISSUES: No acute osseous abnormality. No focal soft tissue abnormality. IMPRESSION: 1. Left lobe hepatic cystic lesion now measures 5.5 cm, decreased from 6.4 cm on the prior study, compatible with hepatic cysts; a few additional subcentimeter hepatic hypodensities likely represent cysts 2. Possible exophytic mass arising from the inferior pole of the right kidney measuring 4.7 cm; indeterminate renal massrecommend prompt renal protocol [name] (preferred) or CT without and with contrast for characterization and urology consultation if suspicious on dedicated imaging 3. Small amount of free fluid in the right paracolic gutter and in the pelvis Electronically signed by: Fonda Field MD 08/25/2024 11:40 AM EST RP Workstation: [id number]   CT HEAD WO CONTRAST ( ) Result Date: 08/25/2024 EXAM: CT HEAD WITHOUT CONTRAST 08/25/2024 11:27:00 AM TECHNIQUE: CT of the head was performed without the administration of intravenous contrast. Automated exposure control, iterative reconstruction, and/or weight based adjustment of the mA/kV was utilized to reduce the radiation dose to as low as reasonably achievable. COMPARISON: [name] 03/22/2022. CLINICAL HISTORY: Head trauma, minor (Age >= 65y). FINDINGS: BRAIN AND VENTRICLES: No acute  hemorrhage. No evidence of acute infarct. Prominence of the sulci and ventricles compatible with brain atrophy. Hypoattenuating foci in the cerebral white matter, most likely representing chronic small vessel disease. No extra-axial collection. No mass effect or midline shift. ORBITS: No acute abnormality. SINUSES: No acute abnormality. SOFT TISSUES AND SKULL: No acute soft tissue abnormality. No skull fracture. IMPRESSION: 1. No acute intracranial abnormality. Electronically signed by: Waddell Calk MD 08/25/2024 11:34 AM EST RP Workstation: [id number]   DG Chest Port 1 View Result Date: 08/25/2024 EXAM: 1 VIEW(S) XRAY OF THE CHEST 08/25/2024 11:11:00 AM COMPARISON: 06/18/2024 CLINICAL HISTORY: Questionable sepsis - evaluate for abnormality FINDINGS: LUNGS AND PLEURA: Vascular congestion. Small right-sided pleural effusion. No focal pulmonary opacity. No pneumothorax. HEART AND MEDIASTINUM: Prominent cardiac silhouette. BONES AND SOFT TISSUES: No acute osseous abnormality. IMPRESSION: 1.  Vascular congestion and small right-sided pleural effusion. 2. Prominent cardiac silhouette. Electronically signed by: Fonda Field MD 08/25/2024 11:28 AM EST RP Workstation: [id number]    Microbiology: Results for orders placed or performed during the hospital encounter of 08/25/24  Resp panel by RT-PCR (RSV, Flu A&B, Covid) Anterior Nasal Swab     Status: None   Collection Time: 08/25/24 10:48 AM   Specimen: Anterior Nasal Swab  Result Value Ref Range Status   SARS Coronavirus 2 by RT PCR NEGATIVE NEGATIVE Final    Comment: (NOTE) SARS-CoV-2 target nucleic acids are NOT DETECTED.  The SARS-CoV-2 RNA is generally detectable in upper respiratory specimens during the acute phase of infection. The lowest concentration of SARS-CoV-2 viral copies this assay can detect is 138 copies/mL. A negative result does not preclude SARS-Cov-2 infection and should not be used as the sole basis for treatment or other  patient management decisions. A negative result may occur with  improper specimen collection/handling, submission of specimen other than nasopharyngeal swab, presence of viral mutation(s) within the areas targeted by this assay, and inadequate number of viral copies(<138 copies/mL). A negative result must be combined with clinical observations, patient history, and epidemiological information. The expected result is Negative.  Fact Sheet for Patients:  bloggercourse.com  Fact Sheet for Healthcare Providers:  seriousbroker.it  This test is no t yet approved or cleared by the [place]  FDA and  has been authorized for detection and/or diagnosis of SARS-CoV-2 by FDA under an Emergency Use Authorization (EUA). This EUA will remain  in effect (meaning this test can be used) for the duration of the COVID-19 declaration under Section 564(b)(1) of the Act, 21 U.S.C.section 360bbb-3(b)(1), unless the authorization is terminated  or revoked sooner.       Influenza A by PCR NEGATIVE NEGATIVE Final   Influenza B by PCR NEGATIVE NEGATIVE Final    Comment: (NOTE) The Xpert Xpress SARS-CoV-2/FLU/RSV plus assay is intended as an aid in the diagnosis of influenza from Nasopharyngeal swab specimens and should not be used as a sole basis for treatment. Nasal washings and aspirates are unacceptable for Xpert Xpress SARS-CoV-2/FLU/RSV testing.  Fact Sheet for Patients: bloggercourse.com  Fact Sheet for Healthcare Providers: seriousbroker.it  This test is not yet approved or cleared by the [place]  FDA and has been authorized for detection and/or diagnosis of SARS-CoV-2 by FDA under an Emergency Use Authorization (EUA). This EUA will remain in effect (meaning this test can be used) for the duration of the COVID-19 declaration under Section 564(b)(1) of the Act, 21 U.S.C. section  360bbb-3(b)(1), unless the authorization is terminated or revoked.     Resp Syncytial Virus by PCR NEGATIVE NEGATIVE Final    Comment: (NOTE) Fact Sheet for Patients: bloggercourse.com  Fact Sheet for Healthcare Providers: seriousbroker.it  This test is not yet approved or cleared by the [place]  FDA and has been authorized for detection and/or diagnosis of SARS-CoV-2 by FDA under an Emergency Use Authorization (EUA). This EUA will remain in effect (meaning this test can be used) for the duration of the COVID-19 declaration under Section 564(b)(1) of the Act, 21 U.S.C. section 360bbb-3(b)(1), unless the authorization is terminated or revoked.  Performed at [facility], [address] [place], [place] [address]   Blood Culture (routine x 2)     Status: Abnormal   Collection Time: 08/25/24 10:48 AM   Specimen: BLOOD  Result Value Ref Range Status   Specimen Description   Final    BLOOD BLOOD  RIGHT HAND Performed at [facility], [address] [place], [place] [address]    Special Requests   Final    BOTTLES DRAWN AEROBIC AND ANAEROBIC Blood Culture adequate volume Performed at [facility], [address] [place], [place] [address]    Culture  Setup Time   Final    Organism ID to follow IN BOTH AEROBIC AND ANAEROBIC BOTTLES GRAM NEGATIVE RODS CRITICAL RESULT CALLED TO, READ BACK BY AND VERIFIED WITH: NATHAN BELUE 08/25/24 2300 KLW Performed at [facility], [address] [place], [place] [address]    Culture ESCHERICHIA COLI (A)  Final   Report Status 08/28/2024 FINAL  Final   Organism ID, Bacteria ESCHERICHIA COLI  Final      Susceptibility   Escherichia coli - MIC*    AMPICILLIN <=2 SENSITIVE Sensitive     CEFAZOLIN (NON-URINE) <=1 SENSITIVE Sensitive     CEFEPIME  <=0.12 SENSITIVE Sensitive     ERTAPENEM <=0.12 SENSITIVE Sensitive     CEFTRIAXONE  <=0.25 SENSITIVE  Sensitive     CIPROFLOXACIN <=0.06 SENSITIVE Sensitive     GENTAMICIN <=1 SENSITIVE Sensitive     MEROPENEM <=0.25 SENSITIVE Sensitive     TRIMETH/SULFA <=20 SENSITIVE Sensitive     AMPICILLIN/SULBACTAM <=2 SENSITIVE Sensitive     PIP/TAZO Value in next row Sensitive      <=4 SENSITIVEThis is a modified FDA-approved test that has been validated and its performance characteristics determined by the reporting laboratory.  This laboratory is certified under the Clinical Laboratory Improvement Amendments CLIA as qualified to perform high complexity clinical laboratory testing.    * ESCHERICHIA COLI  Blood Culture ID Panel (Reflexed)     Status: Abnormal   Collection Time: 08/25/24 10:48 AM  Result Value Ref Range Status   Enterococcus faecalis NOT DETECTED NOT DETECTED Final   Enterococcus Faecium NOT DETECTED NOT DETECTED Final   Listeria monocytogenes NOT DETECTED NOT DETECTED Final   Staphylococcus species NOT DETECTED NOT DETECTED Final   Staphylococcus aureus (BCID) NOT DETECTED NOT DETECTED Final   Staphylococcus epidermidis NOT DETECTED NOT DETECTED Final   Staphylococcus lugdunensis NOT DETECTED NOT DETECTED Final   Streptococcus species NOT DETECTED NOT DETECTED Final   Streptococcus agalactiae NOT DETECTED NOT DETECTED Final   Streptococcus pneumoniae NOT DETECTED NOT DETECTED Final   Streptococcus pyogenes NOT DETECTED NOT DETECTED Final   A.calcoaceticus-baumannii NOT DETECTED NOT DETECTED Final   Bacteroides fragilis NOT DETECTED NOT DETECTED Final   Enterobacterales DETECTED (A) NOT DETECTED Final    Comment: Enterobacterales represent a large order of gram negative bacteria, not a single organism. CRITICAL RESULT CALLED TO, READ BACK BY AND VERIFIED WITH: NATHAN BELUE 08/25/24 2300 KLW    Enterobacter cloacae complex NOT DETECTED NOT DETECTED Final   Escherichia coli DETECTED (A) NOT DETECTED Final    Comment: CRITICAL RESULT CALLED TO, READ BACK BY AND VERIFIED  WITH: NATHAN BELUE 08/25/24 2300 KLW    Klebsiella aerogenes NOT DETECTED NOT DETECTED Final   Klebsiella oxytoca NOT DETECTED NOT DETECTED Final   Klebsiella pneumoniae NOT DETECTED NOT DETECTED Final   Proteus species NOT DETECTED NOT DETECTED Final   Salmonella species NOT DETECTED NOT DETECTED Final   Serratia marcescens NOT DETECTED NOT DETECTED Final   Haemophilus influenzae NOT DETECTED NOT DETECTED Final   Neisseria meningitidis NOT DETECTED NOT DETECTED Final   Pseudomonas aeruginosa NOT DETECTED NOT DETECTED Final   Stenotrophomonas maltophilia NOT DETECTED NOT DETECTED Final   Candida albicans NOT DETECTED NOT  DETECTED Final   Candida auris NOT DETECTED NOT DETECTED Final   Candida glabrata NOT DETECTED NOT DETECTED Final   Candida krusei NOT DETECTED NOT DETECTED Final   Candida parapsilosis NOT DETECTED NOT DETECTED Final   Candida tropicalis NOT DETECTED NOT DETECTED Final   Cryptococcus neoformans/gattii NOT DETECTED NOT DETECTED Final   CTX-M ESBL NOT DETECTED NOT DETECTED Final   Carbapenem resistance IMP NOT DETECTED NOT DETECTED Final   Carbapenem resistance KPC NOT DETECTED NOT DETECTED Final   Carbapenem resistance NDM NOT DETECTED NOT DETECTED Final   Carbapenem resist OXA 48 LIKE NOT DETECTED NOT DETECTED Final   Carbapenem resistance VIM NOT DETECTED NOT DETECTED Final    Comment: Performed at [facility], [address] [place], [place] [address]  Blood Culture (routine x 2)     Status: Abnormal   Collection Time: 08/25/24 11:03 AM   Specimen: BLOOD LEFT HAND  Result Value Ref Range Status   Specimen Description   Final    BLOOD LEFT HAND Performed at [facility], [address], [place], [place] [address]    Special Requests   Final    BOTTLES DRAWN AEROBIC AND ANAEROBIC Blood Culture results may not be optimal due to an inadequate volume of blood received in culture bottles Performed at [facility], [address] [place], [place] [address]    Culture  Setup Time   Final    GRAM NEGATIVE RODS IN BOTH AEROBIC AND ANAEROBIC BOTTLES CRITICAL RESULT CALLED TO, READ BACK BY AND VERIFIED WITH: NATHAN BELUE 08/25/24 2300 KLW Performed at [facility], [address] [place], [place] [address]    Culture (A)  Final    ESCHERICHIA COLI SUSCEPTIBILITIES PERFORMED ON PREVIOUS CULTURE WITHIN THE LAST 5 DAYS. Performed at [facility], [address], [place], [place] [address]    Report Status 08/28/2024 FINAL  Final  Urine Culture     Status: Abnormal   Collection Time: 08/25/24 11:03 AM   Specimen: Urine, Random  Result Value Ref Range Status   Specimen Description   Final    URINE, RANDOM Performed at [facility], [address] [place], [place] [address]    Special Requests   Final    NONE Reflexed from [phone] Performed at [facility], [address] [place], [place] [address]    Culture >=100,000 COLONIES/mL ESCHERICHIA COLI (A)  Final   Report Status 08/27/2024 FINAL  Final   Organism ID, Bacteria ESCHERICHIA COLI (A)  Final      Susceptibility   Escherichia coli - MIC*    AMPICILLIN <=2 SENSITIVE Sensitive     CEFAZOLIN (URINE) Value in next row Sensitive      <=1 SENSITIVEThis is a modified FDA-approved test that has been validated and its performance characteristics determined by the reporting laboratory.  This laboratory is certified under the Clinical Laboratory Improvement Amendments CLIA as qualified to perform high complexity clinical laboratory testing.    CEFEPIME  Value in next row Sensitive      <=1 SENSITIVEThis is a modified FDA-approved test that has been validated and its performance characteristics determined by the reporting laboratory.  This laboratory is certified under the Clinical Laboratory Improvement Amendments CLIA as qualified to perform high complexity clinical laboratory testing.    ERTAPENEM Value in next row Sensitive      <=1  SENSITIVEThis is a modified FDA-approved test that has been validated and its performance characteristics determined by the  reporting laboratory.  This laboratory is certified under the Clinical Laboratory Improvement Amendments CLIA as qualified to perform high complexity clinical laboratory testing.    CEFTRIAXONE  Value in next row Sensitive      <=1 SENSITIVEThis is a modified FDA-approved test that has been validated and its performance characteristics determined by the reporting laboratory.  This laboratory is certified under the Clinical Laboratory Improvement Amendments CLIA as qualified to perform high complexity clinical laboratory testing.    CIPROFLOXACIN Value in next row Sensitive      <=1 SENSITIVEThis is a modified FDA-approved test that has been validated and its performance characteristics determined by the reporting laboratory.  This laboratory is certified under the Clinical Laboratory Improvement Amendments CLIA as qualified to perform high complexity clinical laboratory testing.    GENTAMICIN Value in next row Sensitive      <=1 SENSITIVEThis is a modified FDA-approved test that has been validated and its performance characteristics determined by the reporting laboratory.  This laboratory is certified under the Clinical Laboratory Improvement Amendments CLIA as qualified to perform high complexity clinical laboratory testing.    NITROFURANTOIN Value in next row Sensitive      <=1 SENSITIVEThis is a modified FDA-approved test that has been validated and its performance characteristics determined by the reporting laboratory.  This laboratory is certified under the Clinical Laboratory Improvement Amendments CLIA as qualified to perform high complexity clinical laboratory testing.    TRIMETH/SULFA Value in next row Sensitive      <=1 SENSITIVEThis is a modified FDA-approved test that has been validated and its performance characteristics determined by the reporting laboratory.  This  laboratory is certified under the Clinical Laboratory Improvement Amendments CLIA as qualified to perform high complexity clinical laboratory testing.    AMPICILLIN/SULBACTAM Value in next row Sensitive      <=1 SENSITIVEThis is a modified FDA-approved test that has been validated and its performance characteristics determined by the reporting laboratory.  This laboratory is certified under the Clinical Laboratory Improvement Amendments CLIA as qualified to perform high complexity clinical laboratory testing.    PIP/TAZO Value in next row Sensitive      <=4 SENSITIVEThis is a modified FDA-approved test that has been validated and its performance characteristics determined by the reporting laboratory.  This laboratory is certified under the Clinical Laboratory Improvement Amendments CLIA as qualified to perform high complexity clinical laboratory testing.    MEROPENEM Value in next row Sensitive      <=4 SENSITIVEThis is a modified FDA-approved test that has been validated and its performance characteristics determined by the reporting laboratory.  This laboratory is certified under the Clinical Laboratory Improvement Amendments CLIA as qualified to perform high complexity clinical laboratory testing.    * >=100,000 COLONIES/mL ESCHERICHIA COLI    Labs: CBC: Recent Labs  Lab 08/26/24 0431 08/29/24 0621 08/30/24 0434 08/31/24 0534 09/01/24 0526  WBC 7.9 6.6 6.2 5.9 6.0  NEUTROABS  --  4.0 3.5 3.2 3.2  HGB 8.2* 7.7* 7.6* 7.5* 7.3*  HCT 24.1* 22.3* 23.5* 22.3* 22.2*  MCV 91.6 88.8 91.8 91.4 91.4  PLT 108* 153 189 200 228   Basic Metabolic Panel: Recent Labs  Lab 08/28/24 1153 08/29/24 0621 08/30/24 0434 08/31/24 0534 09/01/24 0526  NA 140 144 143 142 144  K 3.2* 3.7 3.6 3.7 4.2  CL 105 113* 113* 110 113*  CO2 24 23 24 24 25   GLUCOSE 131* 88 104* 94 95  BUN 32* 30* 27* 23 23  CREATININE 2.41* 2.09*  2.01* 1.91* 2.01*  CALCIUM  8.8* 8.5* 8.3* 8.2* 8.4*   Liver Function Tests: No  results for input(s): AST, ALT, ALKPHOS, BILITOT, PROT, ALBUMIN in the last 168 hours. CBG: No results for input(s): GLUCAP in the last 168 hours.  Discharge time spent:  34 minutes.  Signed: Drue ONEIDA Potter, MD [facility] 09/01/2024

## 2024-09-01 NOTE — TOC Transition Note (Signed)
 Transition of Care [facility]) - Discharge Note   Patient Details  Name: [name] MRN: [id number] Date of Birth: [birth year]  Transition of Care [facility]) CM/SW Contact:  [name], RN Phone Number: 09/01/2024, 2:57 PM   Clinical Narrative:  RNCM met with patient and her daughters at the bedside, introduced role and explained that discharge planning would be discussed. Reviewed HH Agency offers and they chose [facility]. Daughter states that they are in agreement with the patient receiving a RW. Pt will be going to stay with her granddaughter temporarily. Order emailed to [facility] for RW to be delivered to the patient's room. Pt has discharge orders, no further concerns. RNCM signing off.    Final next level of care: Home w Home Health Services Barriers to Discharge: Barriers Resolved   Patient Goals and CMS Choice            Discharge Placement                Patient to be transferred to facility by: Daughter Name of family member notified: Molly Patient and family notified of of transfer: 09/01/24  Discharge Plan and Services Additional resources added to the After Visit Summary for                  DME Arranged: Walker rolling   Date DME Agency Contacted: 09/01/24   Representative spoke with at DME Agency: Email sent to [employer] for RW HH Arranged: [facility] Agency: [facility] Date [facility] Agency Contacted: 09/01/24   Representative spoke with at [facility] Agency: Referral's sent and accepted via EPIC  Social Drivers of Health (SDOH) Interventions SDOH Screenings   Food Insecurity: No Food Insecurity (08/25/2024)  Housing: Low Risk  (08/25/2024)  Transportation Needs: No Transportation Needs (08/25/2024)  Utilities: Not At Risk (08/25/2024)  Alcohol Screen: Low Risk  (07/11/2024)  Depression (PHQ2-9): Low Risk  (07/18/2024)  Financial Resource Strain: Low Risk  (07/11/2024)  Physical Activity: Inactive (07/11/2024)  Social Connections: Socially Integrated  (08/25/2024)  Stress: No Stress Concern Present (07/11/2024)  Tobacco Use: Low Risk  (08/25/2024)  Health Literacy: Inadequate Health Literacy (07/11/2024)     Readmission Risk Interventions     No data to display

## 2024-09-01 NOTE — Progress Notes (Signed)
 Physical Therapy Treatment Patient Details Name: [name] MRN: [id number] DOB: Mar 04, 1941 Today's Date: 09/01/2024   History of Present Illness Pt is a 83 year old female who presents ED for chief concerns of altered mental status and fever. Current MD assessment: complicated UTI. PMH of dementia, atrial fibrillation on Eliquis , CKD stage IV, rheumatoid arthritis, hypertension, hyperlipidemia, history of left hepatic lobe cyst.    PT Comments  Pt in chair. Daughter in room.  Feeling better with no c/o dizziness today.  She stands to RW with cga x 1 and is able to progress gait 200' x 1 and 100' x 1 and complete stair training.  She opts to use bathroom upon return to room and elects to leave RW outside bathroom.  She is able to walk in with cga x 1 and then walks 100' in hallway.  Pt initially stated she would not use RW at home but after gait in hallway, she agrees gait is improved and she feels safer with AD.  Discussed with daughter in room.  Pt will transition to nieces house with +24 hour assist.  Encouraged use of RW upon discharge but anticipate pt will transition to community use once strength and balance improves.  Will benefit from continued therapies at home.  Pt and daughter comfortable with discharge plan.  Discussed with team via secure chat.    If plan is discharge home, recommend the following: A little help with walking and/or transfers;A little help with bathing/dressing/bathroom;Assistance with cooking/housework;Direct supervision/assist for medications management;Direct supervision/assist for financial management;Assist for transportation;Help with stairs or ramp for entrance   Can travel by private vehicle        Equipment Recommendations  Rolling walker (2 wheels)    Recommendations for Other Services       Precautions / Restrictions Precautions Precautions: Fall Recall of Precautions/Restrictions: Impaired Restrictions Weight Bearing Restrictions Per  Provider Order: No     Mobility  Bed Mobility               General bed mobility comments: NT pt in recliner pre/post session, anticipate ease Patient Response: Cooperative  Transfers Overall transfer level: Needs assistance Equipment used: Rolling walker (2 wheels), None Transfers: Sit to/from Stand Sit to Stand: Contact guard assist, Supervision                Ambulation/Gait Ambulation/Gait assistance: Contact guard assist, Supervision Gait Distance (Feet): 200 Feet Assistive device: Rolling walker (2 wheels), None Gait Pattern/deviations: Step-through pattern, Decreased stride length, Shuffle, Narrow base of support Gait velocity: decreased     General Gait Details: no dizziness noted today.   Stairs Stairs: Yes Stairs assistance: Supervision, Contact guard assist Stair Management: Two rails, Step to pattern Number of Stairs: 4 General stair comments: with ease   Wheelchair Mobility     Tilt Bed Tilt Bed Patient Response: Cooperative  Modified Rankin (Stroke Patients Only)       Balance Overall balance assessment: Needs assistance Sitting-balance support: Feet supported Sitting balance-Leahy Scale: Good     Standing balance support: Bilateral upper extremity supported, During functional activity Standing balance-Leahy Scale: Fair Standing balance comment: improved with RW vs n o AD                            Communication Communication Communication: Impaired Factors Affecting Communication: Hearing impaired  Cognition Arousal: Alert Behavior During Therapy: WFL for tasks assessed/performed   PT - Cognitive impairments: History of cognitive impairments  Following commands: Impaired Following commands impaired: Follows one step commands with increased time    Cueing Cueing Techniques: Verbal cues, Tactile cues  Exercises      General Comments        Pertinent Vitals/Pain Pain  Assessment Pain Assessment: No/denies pain    Home Living                          Prior Function            PT Goals (current goals can now be found in the care plan section) Progress towards PT goals: Progressing toward goals    Frequency    Min 2X/week      PT Plan      Co-evaluation              AM-PAC PT 6 Clicks Mobility   Outcome Measure  Help needed turning from your back to your side while in a flat bed without using bedrails?: A Little Help needed moving from lying on your back to sitting on the side of a flat bed without using bedrails?: A Little Help needed moving to and from a bed to a chair (including a wheelchair)?: A Little Help needed standing up from a chair using your arms (e.g., wheelchair or bedside chair)?: A Little Help needed to walk in hospital room?: A Little Help needed climbing 3-5 steps with a railing? : A Little 6 Click Score: 18    End of Session Equipment Utilized During Treatment: Gait belt Activity Tolerance: Patient tolerated treatment well Patient left: in chair;with call bell/phone within reach;with chair alarm set;with family/visitor present Nurse Communication: Mobility status PT Visit Diagnosis: Other abnormalities of gait and mobility (R26.89);Muscle weakness (generalized) (M62.81)     Time: 1100-1120 PT Time Calculation (min) (ACUTE ONLY): 20 min  Charges:    $Gait Training: 8-22 mins PT General Charges $$ ACUTE PT VISIT: 1 Visit                   Lauraine Gills, PTA 09/01/24, 11:28 AM

## 2024-09-01 NOTE — TOC CM/SW Note (Signed)
 Rolling Walker: The beneficiary has a mobility limitation that significantly impairs his/her ability to participate in one or more mobility-related activities of daily living (MRADL) in the home. The patient is able to safely use the walker. The functional mobility deficit can be sufficiently resolved by use of walker.

## 2024-09-02 ENCOUNTER — Telehealth: Payer: Self-pay | Admitting: *Deleted

## 2024-09-02 ENCOUNTER — Other Ambulatory Visit: Payer: Self-pay | Admitting: Family Medicine

## 2024-09-02 ENCOUNTER — Other Ambulatory Visit: Payer: Self-pay

## 2024-09-02 MED ORDER — LISINOPRIL 10 MG PO TABS: 10.0000 mg | ORAL_TABLET | Freq: Every day | ORAL | 1 refills | Status: DC

## 2024-09-02 NOTE — Patient Instructions (Signed)
 Visit Information  Thank you for taking time to visit with me today. Please don't hesitate to contact me if I can be of assistance to you before our next scheduled telephone appointment.  Our next appointment is by telephone on Wednesday, September 11, 2024 at 1:00 pm  Please call the care guide team at [phone] if you need to cancel or reschedule your appointment.   Patient Self Care Activities:  Attend all scheduled provider appointments Call provider office for new concerns or questions  Participate in Transition of Care Program/Attend TOC scheduled calls Take medications as prescribed   Use assistive devices as needed to prevent falls- your new walker Please go over all of your medications and medication question/ concerns with your care providers (doctor) when you visit with them after your recent hospital visit If you believe your condition is getting worse- contact your care providers (doctors) promptly- reaching out to your doctor early when you have concerns can prevent you from having to go to the hospital  Following is a copy of your care plan:   Goals Addressed             This Visit's Progress    [facility]BCI Transitions of Care (TOC) Care Plan       Problems:  Recent hospitalization November 16-23, 2025 for AMS/ fever/ complicated UTI Independent at baseline: resides with supportive spouse and has multiple local extended family members- family members alternate providing care and transportation Temporarily residing with adult granddaughter post-hospital discharge until she is fully recuperated; plan is for patient to return home with spouse as per baseline Family manages all aspects of medication administration/ transportation/ healthcare provider office visits: primary caregiver Daughter Ileene No assistive devices at baseline, prior to admission: has recently prescribed rolling walker post-hospital discharge- per caregiver/ daughter:  not currently needing/ using Has  been contacted by home health agency [facility] [phone]: awaiting call-back for scheduling of home visits (1) unplanned hospital admission x last (6)/ (12) months: (1) additional unplanned hospital admission for observation x last (6) months)  Goal:  Over the next 30 days, the patient will not experience hospital readmission  Interventions:  Transitions of Care: week # 1/ day # 1 Durable Medical Equipment (DME) needs assessed with patient/caregiver Doctor Visits  - discussed the importance of doctor visits Communication with PCP re: successful enrollment into [facility] 30-day program Post discharge activity limitations prescribed by provider reviewed Reviewed Signs and symptoms of infection Provided education/ reinforcement re: role of home health services with importance of participation/ ongoing engagement; confirmed caregiver denies need for assistance in initiating home health services: confirmed has contact information for home health team and is currently waiting call-back Confirmed not currently requiring/ using assistive devices for ambulation-- provided education/ reinforcement around fall prevention  Provided education around benefit of conservative post-hospital discharge activity; need to pace activity without over-doing  Reinforced signs/ symptoms UTI along with corresponding action plan  Attempted to provided my direct contact information should questions/ concerns/ needs arise post-TOC initial call, prior to next [facility] 30-day program RN CM telephone visit, however caregiver abruptly ended call stating I have another call coming in I need to take    Patient Self Care Activities:  Attend all scheduled provider appointments Call provider office for new concerns or questions  Participate in Transition of Care Program/Attend TOC scheduled calls Take medications as prescribed   Use assistive devices as needed to prevent falls- your new walker Please go over all of your medications and  medication question/  concerns with your care providers (doctor) when you visit with them after your recent hospital visit If you believe your condition is getting worse- contact your care providers (doctors) promptly- reaching out to your doctor early when you have concerns can prevent you from having to go to the hospital  Plan:  Telephone follow up appointment with care management team member scheduled for:  09/11/24       Care plan and visit instructions communicated with the patient's daughter- caregiver verbally today. Caregiver agrees to receive a copy in MyChart. Active MyChart status and understanding of how to access instructions and care plan via MyChart confirmed with patient's caregiver.     If you are experiencing a Mental Health or Behavioral Health Crisis or need someone to talk to, please  call the Suicide and Crisis Lifeline: 988 call the [place]  National Suicide Prevention Lifeline: 407 065 1782 or TTY: (778)405-6308 TTY 614-070-8005) to talk to a trained counselor call 1-800-273-TALK (toll free, 24 hour hotline) go to [facility] [address], [place] [phone]) call the [facility] Crisis Line: 276-490-8576 call 911  Beatris Blinda Lawrence, RN, BSN, CCRN Alumnus RN Care Manager  Transitions of Care  VBCI - Population Health  [place] [phone]: direct office

## 2024-09-02 NOTE — Telephone Encounter (Signed)
 Last office visit 07/09/2024 for CPE.  Last refilled:  Listed as historical medication.  Next appt: 09/10/24 for Hosptial follow up.

## 2024-09-02 NOTE — Transitions of Care (Post Inpatient/ED Visit) (Signed)
 09/02/2024  Name: [name] MRN: [id number] DOB: August 29, 1941  Today's TOC FU Call Status: Today's TOC FU Call Status:: Successful TOC FU Call Completed TOC FU Call Complete Date: 09/02/24  Patient's Name and Date of Birth confirmed. Name, DOB (per caregiver/ daughter Ileene, verified on [facility] DPR)  Transition Care Management Follow-up Telephone Call Date of Discharge: 09/01/24 Discharge Facility: [facility]) Type of Discharge: Inpatient Admission Primary Inpatient Discharge Diagnosis:: AMS/ fever; complicated UTI How have you been since you were released from the hospital?: Better (per daugher: She is doing much better; we are taking care of her as a family, handling everything for her and she seems to be much better after the hospital visit.  Taking the antibiotic like they told her to) Any questions or concerns?: No  Items Reviewed: Did you receive and understand the discharge instructions provided?: Yes (thoroughly reviewed with patient who verbalizes good understanding of same) Medications obtained,verified, and reconciled?: Yes (Medications Reviewed) (Full medication reconciliation/ review completed; no concerns or discrepancies identified; confirmed patient obtained/ is taking all newly Rx'd medications as instructed; family-manages medications and denies questions/ concerns around medications today) Any new allergies since your discharge?: No Dietary orders reviewed?: Yes Type of Diet Ordered:: Healthy diet, regular food Do you have support at home?: Yes People in Home [RPT]: grandchild(ren) Name of Support/Comfort Primary Source: Daughter reports patient is essentially independent in self-care activities; normally resides with supportive spouse- temporarily staying with adult granddaughter post-hospital discharge: our entire family is local and we all assist in her care: extended family members assists as/ if needed/ indicated : plan is  for eventual return to her home with her spouse once she fully recuperates and gets better and stronger  Medications Reviewed Today: Medications Reviewed Today     Reviewed by Javohn Basey M, RN (Registered Nurse) on 09/02/24 at 1519  Med List Status: <None>   Medication Order Taking? Sig Documenting Provider Last Dose Status Informant  acetaminophen  (TYLENOL ) 325 MG tablet [id number] Yes Take 1 tablet (325 mg total) by mouth every 6 (six) hours as needed for mild pain (pain score 1-3). Avelina Greig BRAVO, MD  Active Family Member           Med Note NIKKI HADASSAH Repress Aug 25, 2024  2:29 PM) prn  amiodarone  (PACERONE ) 200 MG tablet [id number] Yes TAKE 1/2 TABLET (100 MG TOTAL) BY MOUTH ON MONDAY, TUESDAY, THURSDAY, FRIDAY ANDSATURDAY Camnitz, Will Gladis, MD  Active Family Member  amoxicillin  (AMOXIL ) 500 MG capsule [id number] Yes Take 2 capsules (1,000 mg total) by mouth 2 (two) times daily for 3 days. Dorinda Drue DASEN, MD  Active   doxazosin  (CARDURA ) 2 MG tablet [id number] Yes TAKE 1 TABLET BY MOUTH DAILY Bedsole, Amy E, MD  Active Family Member  ELIQUIS  2.5 MG TABS tablet [id number] Yes TAKE 1 TABLET BY MOUTH TWICE A DAY Bedsole, Amy E, MD  Active Family Member  furosemide  (LASIX ) 20 MG tablet [id number] Yes Take 0.5-1 tablets (10-20 mg total) by mouth daily as needed. Avelina Greig BRAVO, MD  Active Family Member           Med Note NIKKI HADASSAH Repress Aug 25, 2024  2:32 PM) prn  gabapentin  (NEURONTIN ) 100 MG capsule [id number] Yes Take 100 mg by mouth 2 (two) times daily. [provider]  Active Family Member  hydrALAZINE  (APRESOLINE ) 50 MG tablet [id number] Yes TAKE 1 TABLET BY MOUTH EVERY 8 HOURS. Avelina Greig BRAVO, MD  Active Family Member  lisinopril  (ZESTRIL ) 10 MG tablet [id number] Yes Take 1 tablet (10 mg total) by mouth daily. Bedsole, Amy E, MD  Active   LUMIGAN 0.01 % SOLN [id number] Yes Apply 1 drop to eye at bedtime. [provider]  Active Family Member  Melatonin 10 MG CAPS  [id number] Yes Take 10 mg by mouth at bedtime. Dorinda Drue DASEN, MD  Active   methocarbamol  (ROBAXIN ) 500 MG tablet [id number] Yes Take 1 tablet (500 mg total) by mouth 3 (three) times daily as needed for muscle spasms. Take 500 mg by mouth 3 (three) times daily. Avelina Greig BRAVO, MD  Active Family Member           Med Note NIKKI, HADASSAH Repress Aug 25, 2024  2:32 PM) prn  ondansetron  (ZOFRAN ) 4 MG tablet [id number] Yes Take 1-2 tablets (4-8 mg total) by mouth every 8 (eight) hours as needed for nausea or vomiting. Avelina Greig BRAVO, MD  Active Family Member  pantoprazole  (PROTONIX ) 40 MG tablet [id number] Yes TAKE 1 TABLET BY MOUTH DAILY Bedsole, Amy E, MD  Active Family Member  rosuvastatin  (CRESTOR ) 10 MG tablet [id number] Yes TAKE 1 TABLET BY MOUTH DAILY Bedsole, Amy E, MD  Active Family Member  senna-docusate (SENOKOT-S) 8.6-50 MG tablet [id number] Yes Take 1 tablet by mouth at bedtime as needed for mild constipation. Dorinda Drue DASEN, MD  Active   SIMBRINZA 1-0.2 % SUSP [id number] Yes Place 1 drop into both eyes daily. [provider]  Active Family Member  sucralfate  (CARAFATE ) 1 g tablet [id number] Yes Take 1 tablet (1 g total) by mouth 4 (four) times daily -  with meals and at bedtime. Avelina Greig BRAVO, MD  Active Family Member           Home Care and Equipment/Supplies: Were Home Health Services Ordered?: Yes Name of Home Health Agency:: [facility]: [phone]: daughter confirms has spoken with home health agency and is currently waiting call-back from agency with schedule for home visits Has Agency set up a time to come to your home?: No EMR reviewed for Home Health Orders: Orders present/patient has not received call (refer to CM for follow-up) (Successfully enrolled into 30-day TOC program) Any new equipment or medical supplies ordered?: Yes (Rolling walker) Name of Medical supply agency?: [facility] Were you able to get the equipment/medical supplies?: Yes Do you have any questions  related to the use of the equipment/supplies?: No  Functional Questionnaire: Do you need assistance with bathing/showering or dressing?: Yes (granddaughter currently assisting as indicated post-hospital discharge: independent at baseline) Do you need assistance with meal preparation?: Yes (granddaughter currently assisting as indicated post-hospital discharge: independent at baseline) Do you need assistance with eating?: No Do you have difficulty maintaining continence: No Do you need assistance with getting out of bed/getting out of a chair/moving?: No Do you have difficulty managing or taking your medications?: Yes (Family manages all aspects of medication administration)  Follow up appointments reviewed: PCP Follow-up appointment confirmed?: Yes Date of PCP follow-up appointment?: 09/10/24 Follow-up Provider: PCP- Dr. Greig Avelina Specialist [facility] Follow-up appointment confirmed?: Yes Date of Specialist follow-up appointment?: 09/04/24 Follow-Up Specialty Provider:: Oncology provider: retacrit  injection/ anemia Do you need transportation to your follow-up appointment?: No Do you understand care options if your condition(s) worsen?: Yes-patient verbalized understanding  SDOH Interventions Today    Flowsheet Row Most Recent Value  SDOH Interventions   Food Insecurity Interventions Intervention Not Indicated  [per caregiver/ daughter]  Housing Interventions Intervention Not  Indicated  [per caregiver/ daughter]  Transportation Interventions Intervention Not Indicated  [per caregiver/ daughter: various family members provide all transportation for patient]  Utilities Interventions Intervention Not Indicated  [per caregiver/ daughter]   See TOC assessment tabs/ care plan for additional assessment/ TOC intervention information  Pls call/ message for questions,  Shalia Bartko Mckinney Camari Quintanilla, RN, BSN, Media Planner  Transitions of Care  [facility] - [facility]  Health [phone]: direct office

## 2024-09-02 NOTE — Addendum Note (Signed)
 Addended by: WENDELL ARLAND RAMAN on: 09/02/2024 11:24 AM   Modules accepted: Orders

## 2024-09-02 NOTE — Telephone Encounter (Signed)
 Prescription Request  09/02/2024  LOV: 07/09/2024  What is the name of the medication or equipment?  gabapentin  (NEURONTIN ) 100 MG capsule  lisinopril  (ZESTRIL ) 10 MG tablet   Have you contacted your pharmacy to request a refill? No   Which pharmacy would you like this sent to?   [facility] [address]   Patient notified that their request is being sent to the clinical staff for review and that they should receive a response within 2 business days.   Please advise at Mobile [phone] (mobile)

## 2024-09-03 MED ORDER — GABAPENTIN 100 MG PO CAPS: 100.0000 mg | ORAL_CAPSULE | Freq: Two times a day (BID) | ORAL | 1 refills | Status: AC

## 2024-09-04 ENCOUNTER — Inpatient Hospital Stay

## 2024-09-04 VITALS — BP 147/74 | HR 65

## 2024-09-04 DIAGNOSIS — N1832 Chronic kidney disease, stage 3b: Secondary | ICD-10-CM | POA: Diagnosis not present

## 2024-09-04 DIAGNOSIS — D631 Anemia in chronic kidney disease: Secondary | ICD-10-CM

## 2024-09-04 LAB — HEMOGLOBIN AND HEMATOCRIT (CANCER CENTER ONLY)
HCT: 24.8 % — ABNORMAL LOW (ref 36.0–46.0)
Hemoglobin: 8.2 g/dL — ABNORMAL LOW (ref 12.0–15.0)

## 2024-09-04 MED ORDER — EPOETIN ALFA-EPBX 10000 UNIT/ML IJ SOLN
10000.0000 [IU] | Freq: Once | INTRAMUSCULAR | Status: AC
  Administered 2024-09-04: 10000 [IU] via SUBCUTANEOUS
  Filled 2024-09-04: qty 1

## 2024-09-10 ENCOUNTER — Ambulatory Visit: Admitting: Family Medicine

## 2024-09-10 ENCOUNTER — Other Ambulatory Visit: Payer: Self-pay | Admitting: Family Medicine

## 2024-09-10 ENCOUNTER — Ambulatory Visit: Payer: Self-pay | Admitting: Family Medicine

## 2024-09-10 ENCOUNTER — Encounter: Payer: Self-pay | Admitting: Family Medicine

## 2024-09-10 VITALS — BP 118/78 | HR 59 | Temp 97.6°F | Ht 62.5 in | Wt 142.4 lb

## 2024-09-10 DIAGNOSIS — I5032 Chronic diastolic (congestive) heart failure: Secondary | ICD-10-CM

## 2024-09-10 DIAGNOSIS — F02B0 Dementia in other diseases classified elsewhere, moderate, without behavioral disturbance, psychotic disturbance, mood disturbance, and anxiety: Secondary | ICD-10-CM

## 2024-09-10 DIAGNOSIS — N184 Chronic kidney disease, stage 4 (severe): Secondary | ICD-10-CM

## 2024-09-10 DIAGNOSIS — N644 Mastodynia: Secondary | ICD-10-CM

## 2024-09-10 DIAGNOSIS — R928 Other abnormal and inconclusive findings on diagnostic imaging of breast: Secondary | ICD-10-CM

## 2024-09-10 DIAGNOSIS — I10 Essential (primary) hypertension: Secondary | ICD-10-CM

## 2024-09-10 LAB — BASIC METABOLIC PANEL WITH GFR
BUN: 22 mg/dL (ref 6–23)
CO2: 30 meq/L (ref 19–32)
Calcium: 9.6 mg/dL (ref 8.4–10.5)
Chloride: 105 meq/L (ref 96–112)
Creatinine, Ser: 1.99 mg/dL — ABNORMAL HIGH (ref 0.40–1.20)
GFR: 23.02 mL/min — ABNORMAL LOW (ref >60.00)
Glucose, Bld: 89 mg/dL (ref 70–99)
Potassium: 4.3 meq/L (ref 3.5–5.1)
Sodium: 142 meq/L (ref 135–145)

## 2024-09-10 NOTE — Assessment & Plan Note (Signed)
Chronic, stable controlled

## 2024-09-10 NOTE — Progress Notes (Signed)
 Patient ID: [name], female    DOB: 12-08-1940, 83 y.o.   MRN: [id number]  This visit was conducted in person.  BP 118/78   Pulse (!) 59   Temp 97.6 F (36.4 C) (Temporal)   Ht 5' 2.5 (1.588 m)   Wt 142 lb 6 oz (64.6 kg)   SpO2 96%   BMI 25.63 kg/m    CC:  Chief Complaint  Patient presents with   Hospitalization Follow-up    Subjective:   HPI: [name] is a 83 y.o. female presenting on 09/10/2024 for Hospitalization Follow-up   Admitted 08/25/2024 Discharge Date: 09/01/2024 Presented with AMS Complicated UTI (urinary tract infection) (+)Ecoli bacteremia Sepsis ruled out  Metabolic encephalopathy d/t UTI in elderly patient - resolved  Blood culture x 2 --> (+)Ecoli, susceptibilities pending  urine culture (+)Ecoli, pansensitive dc cefepime , vancomycin , metronidazole  --> started ceftriaxone  and transitioned to amoxicillin  to complete 1 g twice daily to complete course Discussed with ID pharmacist    CKD (chronic kidney disease) stage 4, GFR 15-29 ml/min  At baseline Monitor BMP   GERD (gastroesophageal reflux disease) PPI   Essential hypertension Orthostatic hypotension   Cardura  discontinued TED stockings   HYPERLIPIDEMIA Rosuvastatin  10 mg nightly resume   Atrial fibrillation Eliquis  Continue amiodarone    Mild cognitive impairment Hospital delirium Insomnia Ativan  prn Seroquel  at bedtime prn    TODAY she presents with her daughter.  She is back at baseline.  No burning with  urination.   No abdominal pain.  Does have low back pain, chronically.   Almost back at baseline   Daughter is interested in home health AIDE given decrease mobility/deconditioning. She is having trouble  with ADLs (bathing, toileting ( some incontinence at times)) No issue issues with feeding. She is staying with her niece currently.  She is having trouble with bathing... does the shower with a chair.  Forgetting to take meds... husband is trying to  help.  She does already have PT and SN coming out now...   Eating well.  Daughter trying to  encourage fluids.  BM daily, no straining. Wt Readings from Last 3 Encounters:  09/13/24 136 lb 9.6 oz (62 kg)  09/10/24 142 lb 6 oz (64.6 kg)  08/25/24 131 lb 6.3 oz (59.6 kg)   Her niece noted a breast lump ( she is not at appt and daughter is not sure where it is) and pt reports some breast pain    Relevant past medical, surgical, family and social history reviewed and updated as indicated. Interim medical history since our last visit reviewed. Allergies and medications reviewed and updated. Outpatient Medications Prior to Visit  Medication Sig Dispense Refill   acetaminophen  (TYLENOL ) 325 MG tablet Take 1 tablet (325 mg total) by mouth every 6 (six) hours as needed for mild pain (pain score 1-3). 90 tablet 0   amiodarone  (PACERONE ) 200 MG tablet TAKE 1/2 TABLET (100 MG TOTAL) BY MOUTH ON MONDAY, TUESDAY, THURSDAY, FRIDAY ANDSATURDAY 8 tablet 0   doxazosin  (CARDURA ) 2 MG tablet TAKE 1 TABLET BY MOUTH DAILY 90 tablet 3   ELIQUIS  2.5 MG TABS tablet TAKE 1 TABLET BY MOUTH TWICE A DAY 180 tablet 3   furosemide  (LASIX ) 20 MG tablet Take 0.5-1 tablets (10-20 mg total) by mouth daily as needed. 15 tablet 0   gabapentin  (NEURONTIN ) 100 MG capsule Take 1 capsule (100 mg total) by mouth 2 (two) times daily. 180 capsule 1   hydrALAZINE  (APRESOLINE ) 50 MG tablet  TAKE 1 TABLET BY MOUTH EVERY 8 HOURS. 270 tablet 3   lisinopril  (ZESTRIL ) 10 MG tablet Take 1 tablet (10 mg total) by mouth daily. 90 tablet 1   LUMIGAN 0.01 % SOLN Apply 1 drop to eye at bedtime.     Melatonin 10 MG CAPS Take 10 mg by mouth at bedtime. 30 capsule 0   methocarbamol  (ROBAXIN ) 500 MG tablet Take 1 tablet (500 mg total) by mouth 3 (three) times daily as needed for muscle spasms. Take 500 mg by mouth 3 (three) times daily. 30 tablet 0   ondansetron  (ZOFRAN ) 4 MG tablet Take 1-2 tablets (4-8 mg total) by mouth every 8 (eight) hours  as needed for nausea or vomiting. 30 tablet 0   pantoprazole  (PROTONIX ) 40 MG tablet TAKE 1 TABLET BY MOUTH DAILY 30 tablet 11   rosuvastatin  (CRESTOR ) 10 MG tablet TAKE 1 TABLET BY MOUTH DAILY 90 tablet 0   senna-docusate (SENOKOT-S) 8.6-50 MG tablet Take 1 tablet by mouth at bedtime as needed for mild constipation. 20 tablet 0   SIMBRINZA 1-0.2 % SUSP Place 1 drop into both eyes daily.     sucralfate  (CARAFATE ) 1 g tablet Take 1 tablet (1 g total) by mouth 4 (four) times daily -  with meals and at bedtime. 120 tablet 3   No facility-administered medications prior to visit.     Per HPI unless specifically indicated in ROS section below Review of Systems  Constitutional:  Negative for fatigue and fever.  HENT:  Negative for congestion.   Eyes:  Negative for pain.  Respiratory:  Negative for cough and shortness of breath.   Cardiovascular:  Negative for chest pain, palpitations and leg swelling.  Gastrointestinal:  Negative for abdominal pain.  Genitourinary:  Negative for dysuria and vaginal bleeding.  Musculoskeletal:  Negative for back pain.  Neurological:  Negative for syncope, light-headedness and headaches.  Psychiatric/Behavioral:  Negative for dysphoric mood.    Objective:  BP 118/78   Pulse (!) 59   Temp 97.6 F (36.4 C) (Temporal)   Ht 5' 2.5 (1.588 m)   Wt 142 lb 6 oz (64.6 kg)   SpO2 96%   BMI 25.63 kg/m   Wt Readings from Last 3 Encounters:  09/13/24 136 lb 9.6 oz (62 kg)  09/10/24 142 lb 6 oz (64.6 kg)  08/25/24 131 lb 6.3 oz (59.6 kg)      Physical Exam Constitutional:      General: She is not in acute distress.    Appearance: Normal appearance. She is well-developed. She is not ill-appearing or toxic-appearing.  HENT:     Head: Normocephalic.     Right Ear: Hearing, tympanic membrane, ear canal and external ear normal. Tympanic membrane is not erythematous, retracted or bulging.     Left Ear: Hearing, tympanic membrane, ear canal and external ear normal.  Tympanic membrane is not erythematous, retracted or bulging.     Nose: No mucosal edema or rhinorrhea.     Right Sinus: No maxillary sinus tenderness or frontal sinus tenderness.     Left Sinus: No maxillary sinus tenderness or frontal sinus tenderness.     Mouth/Throat:     Pharynx: Uvula midline.  Eyes:     General: Lids are normal. Lids are everted, no foreign bodies appreciated.     Conjunctiva/sclera: Conjunctivae normal.     Pupils: Pupils are equal, round, and reactive to light.  Neck:     Thyroid : No thyroid  mass or thyromegaly.  Vascular: No carotid bruit.     Trachea: Trachea normal.  Cardiovascular:     Rate and Rhythm: Normal rate and regular rhythm.     Pulses: Normal pulses.     Heart sounds: Normal heart sounds, S1 normal and S2 normal. No murmur heard.    No friction rub. No gallop.  Pulmonary:     Effort: Pulmonary effort is normal. No tachypnea or respiratory distress.     Breath sounds: Normal breath sounds. No decreased breath sounds, wheezing, rhonchi or rales.  Abdominal:     General: Bowel sounds are normal.     Palpations: Abdomen is soft.     Tenderness: There is no abdominal tenderness.  Musculoskeletal:     Cervical back: Normal range of motion and neck supple.  Skin:    General: Skin is warm and dry.     Findings: No rash.  Neurological:     Mental Status: She is alert.  Psychiatric:        Mood and Affect: Mood is not anxious or depressed.        Speech: Speech normal.        Behavior: Behavior normal. Behavior is cooperative.        Thought Content: Thought content normal.        Judgment: Judgment normal.       Results for orders placed or performed in visit on 09/10/24  Basic Metabolic Panel   Collection Time: 09/10/24  1:31 PM  Result Value Ref Range   Sodium 142 135 - 145 mEq/L   Potassium 4.3 3.5 - 5.1 mEq/L   Chloride 105 96 - 112 mEq/L   CO2 30 19 - 32 mEq/L   Glucose, Bld 89 70 - 99 mg/dL   BUN 22 6 - 23 mg/dL   Creatinine,  Ser 8.00 (H) 0.40 - 1.20 mg/dL   GFR 76.97 (L) >39.99 mL/min   Calcium  9.6 8.4 - 10.5 mg/dL    Assessment and Plan  CKD (chronic kidney disease) stage 4, GFR 15-29 ml/min (HCC) Assessment & Plan: Reevaluate kidney function. Given patient is having significant peripheral edema and was taken off spironolactone  presumptively for acute dehydration, will recheck basic metabolic panel and consider restart of spironolactone  25 mg daily. Previously she has been using Lasix  as needed but this did tend to worsen her kidney function.  Orders: -     Basic metabolic panel with GFR  Breast pain Assessment & Plan: New, bilateral, no focal pain.  No masses noted on breast exam. Will refer for diagnostic mammogram.  Orders: -     MM 3D DIAGNOSTIC MAMMOGRAM BILATERAL BREAST; Future  Essential hypertension Assessment & Plan: Chronic, stable controlled.   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   Chronic diastolic CHF (congestive heart failure) (HCC) Assessment & Plan: Chronic with significant peripheral edema, clear lung exam. Will consider restarting spironolactone  25 daily.      No follow-ups on file.   Greig Ring, MD

## 2024-09-10 NOTE — Assessment & Plan Note (Signed)
 Chronic with significant peripheral edema, clear lung exam. Will consider restarting spironolactone  25 daily.

## 2024-09-10 NOTE — Patient Instructions (Addendum)
 Can start start [place] and cranberry for UTI prevention.    [place] [facility]   Phone:  [phone]   [address]                                                                            [place], [place] [address]                                            Services: 3D Mammogram and Bone [facility]: Lambert Rhonda W)  Phone:  [phone]   [address]                        [place], [place] [address]                                              Services:  3D Mammogram and Bone Density

## 2024-09-10 NOTE — Assessment & Plan Note (Signed)
 Chronic, back at baseline.

## 2024-09-10 NOTE — Assessment & Plan Note (Signed)
 Reevaluate kidney function. Given patient is having significant peripheral edema and was taken off spironolactone  presumptively for acute dehydration, will recheck basic metabolic panel and consider restart of spironolactone  25 mg daily. Previously she has been using Lasix  as needed but this did tend to worsen her kidney function.

## 2024-09-11 ENCOUNTER — Other Ambulatory Visit: Payer: Self-pay | Admitting: *Deleted

## 2024-09-11 NOTE — Transitions of Care (Post Inpatient/ED Visit) (Signed)
 Transition of Care week 2/ day # 9  Visit Note  09/11/2024  Name: [name] MRN: [id number]          DOB: [birth year]  Situation: Patient enrolled in [facility] 30-day program. Visit completed with patient's daughter- caregiver Ileene, verified on [facility] DPR by telephone.   Background:  Recent hospitalization November 16-23, 2025 for AMS/ fever/ complicated UTI Independent at baseline: resides with supportive spouse and has multiple local extended family members- family members alternate providing care and transportation Temporarily residing with adult granddaughter post-hospital discharge until she is fully recuperated; plan is for patient to return home with spouse as per baseline Family manages all aspects of medication administration/ transportation/ healthcare provider office visits: primary caregiver Daughter Ileene (1) unplanned hospital admission x last (6)/ (12) months: (1) additional unplanned hospital admission for observation x last (6) months)  Initial Transition Care Management Follow-up Telephone Call Discharge Date and Diagnosis: 09/01/24, AMS/ fever; complicated UTI   Past Medical History:  Diagnosis Date   Anemia    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 cath 2/09: no CAD, EF 70%   GERD (gastroesophageal reflux disease) 07/26/2021   HYPERLIPIDEMIA    HYPERTENSION    Hypertensive urgency 07/19/2022   Mild Aortic insufficiency    Moderate mitral regurgitation    Moderate tricuspid regurgitation    OSTEOPENIA    PAF (paroxysmal atrial fibrillation) (HCC)    a. s/p ablation 2013; b. CHADS2VASc -> 4 (HTN, age x 2, female)-->Eliquis .   PAT (paroxysmal atrial tachycardia)    a. 04/2019 Zio: Occas PACs and rare PVCs. 21 atrial runs - longest 20 beats, max rate 169.   Pneumonia 2009   RA (rheumatoid arthritis) (HCC)    Sinus Bradycardia    a. asymptomatic but prevents  use of AVN blocking agents; b. 04/2019 Zio: Avg HR 61 (37-109).   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 05/2019 Echo: EF 60-65%. DD. RVSP 41.74mmHg. Mod dil LA. Mod MR/TR. Mild AI.   Assessment:  Per caregiver/ daughter:  She is doing fine and I have no concerns at all with her condition.  She feels so good she wants to go back home to her husband, but right now she is still staying with my niece, her granddaughter.  We had a good visit with Dr. Avelina yesterday.  She told us  to go ahead and re-start the spironolactone , so we have started that back up.  I will call the [facility] doctor for any refills, as you have advised.  No falls.  The home health nnurse came out to the house on Saturday, we are waiting to hear back about when the next visit will be.    Caregiver denies clinical concerns around patient's condition and reports patient in no distress during [facility] 30-day program outreach call today  Patient Reported Symptoms: Cognitive Cognitive Status: Normal speech and language skills, Alert and oriented to person, place, and time (per caregiver- daughter Ileene) Cognitive/Intellectual Conditions Management [RPT]: None reported or documented in medical history or problem list      Neurological Neurological Review of Symptoms: No symptoms reported, Other: (per caregiver- daughter Ileene) Oher Neurological Symptoms/Conditions [RPT]: History of mild dementia per caregiver report    HEENT HEENT Symptoms Reported: No symptoms reported (per caregiver- daughter Ileene)      Cardiovascular Cardiovascular Symptoms Reported: No symptoms reported, Other: (  per caregiver- daughter Ileene) Other Cardiovascular Symptoms: Caregiver reports ongoing little bit of lower extremity swelling: reports Dr. Avelina told us  to go ahead and re-start the spironolactone ; confirmed per caregiver report this medication was previously ordered through [facility] cardiology provider [facility]): encouraged  daughter to call [facility] cardiology office for any refill needs: and reinforced need to re-start this medication as directed by PCP at time of hospital follow up office visit 09/10/24; re-provided contact information for [facility] cardiology provider Dr. Jama [phone]- per outside [facility] records; also advised daughter she can always refer to actual Rx bottle for additional prescribing information Does patient have uncontrolled Hypertension?: No Cardiovascular Management Strategies: Routine screening, Medication therapy, Adequate rest, Coping strategies  Respiratory Respiratory Symptoms Reported: No symptoms reported (per daughter- caregiver Ileene) Other Respiratory Symptoms: She is breathing easy, no concerns at all around her breathing, she takes her time and rests throughout every day Respiratory Management Strategies: Adequate rest, Coping strategies  Endocrine Endocrine Symptoms Reported: No symptoms reported (per daughter- caregiver Ileene)    Gastrointestinal Gastrointestinal Symptoms Reported: No symptoms reported (per daughter- caregiver Ileene) Additional Gastrointestinal Details: She is eating real good and going to the bathroom normally      Genitourinary Genitourinary Symptoms Reported: No symptoms reported (per daughter- caregiver Ileene)    Integumentary Integumentary Symptoms Reported: No symptoms reported (per daughter- caregiver Ileene)    Musculoskeletal Musculoskelatal Symptoms Reviewed: No symptoms reported (per daughter- caregiver Ileene) Additional Musculoskeletal Details: confirmed not currently requiring/ using assistive devices for ambulation- confirmed no new/ recent falls post- recent hospital discharge        Psychosocial Psychosocial Symptoms Reported: No symptoms reported (per daughter- caregiver Ileene)         There were no vitals filed for this visit.    Medications Reviewed Today     Reviewed by Sherhonda Gaspar M, RN (Registered Nurse) on  09/11/24 at 1313  Med List Status: <None>   Medication Order Taking? Sig Documenting Provider Last Dose Status Informant  acetaminophen  (TYLENOL ) 325 MG tablet [id number]  Take 1 tablet (325 mg total) by mouth every 6 (six) hours as needed for mild pain (pain score 1-3). Avelina Greig BRAVO, MD  Active Family Member           Med Note NIKKI HADASSAH Repress Aug 25, 2024  2:29 PM) prn  amiodarone  (PACERONE ) 200 MG tablet [id number]  TAKE 1/2 TABLET (100 MG TOTAL) BY MOUTH ON MONDAY, TUESDAY, THURSDAY, FRIDAY ANDSATURDAY Camnitz, Will Gladis, MD  Active Family Member  doxazosin  (CARDURA ) 2 MG tablet [id number]  TAKE 1 TABLET BY MOUTH DAILY Bedsole, Amy E, MD  Active Family Member  ELIQUIS  2.5 MG TABS tablet [id number]  TAKE 1 TABLET BY MOUTH TWICE A DAY Bedsole, Amy E, MD  Active Family Member  furosemide  (LASIX ) 20 MG tablet [id number]  Take 0.5-1 tablets (10-20 mg total) by mouth daily as needed. Avelina Greig BRAVO, MD  Active Family Member           Med Note NIKKI, HADASSAH Repress Aug 25, 2024  2:32 PM) prn  gabapentin  (NEURONTIN ) 100 MG capsule [id number]  Take 1 capsule (100 mg total) by mouth 2 (two) times daily. Avelina Greig BRAVO, MD  Active   hydrALAZINE  (APRESOLINE ) 50 MG tablet [id number]  TAKE 1 TABLET BY MOUTH EVERY 8 HOURS. Avelina Greig BRAVO, MD  Active Family Member  lisinopril  (ZESTRIL ) 10 MG tablet [id number]  Take 1 tablet (10 mg total) by mouth daily. Avelina Greig BRAVO, MD  Active   LUMIGAN 0.01 % SOLN [id number]  Apply 1 drop to eye at bedtime. [provider]  Active Family Member  Melatonin 10 MG CAPS [id number]  Take 10 mg by mouth at bedtime. Dorinda Drue DASEN, MD  Active   methocarbamol  (ROBAXIN ) 500 MG tablet [id number]  Take 1 tablet (500 mg total) by mouth 3 (three) times daily as needed for muscle spasms. Take 500 mg by mouth 3 (three) times daily. Avelina Greig BRAVO, MD  Active Family Member           Med Note NIKKI, HADASSAH Repress Aug 25, 2024  2:32 PM) prn  ondansetron  (ZOFRAN ) 4 MG tablet  [id number]  Take 1-2 tablets (4-8 mg total) by mouth every 8 (eight) hours as needed for nausea or vomiting. Avelina Greig BRAVO, MD  Active Family Member  pantoprazole  (PROTONIX ) 40 MG tablet [id number]  TAKE 1 TABLET BY MOUTH DAILY Bedsole, Amy E, MD  Active Family Member  rosuvastatin  (CRESTOR ) 10 MG tablet [id number]  TAKE 1 TABLET BY MOUTH DAILY Bedsole, Amy E, MD  Active Family Member  senna-docusate (SENOKOT-S) 8.6-50 MG tablet [id number]  Take 1 tablet by mouth at bedtime as needed for mild constipation. Dorinda Drue DASEN, MD  Active   SIMBRINZA 1-0.2 % SUSP [id number]  Place 1 drop into both eyes daily. [provider]  Active Family Member  sucralfate  (CARAFATE ) 1 g tablet [id number]  Take 1 tablet (1 g total) by mouth 4 (four) times daily -  with meals and at bedtime. Avelina Greig BRAVO, MD  Active Family Member           Recommendation:   Continue Current Plan of Care  Follow Up Plan:   Telephone follow-up in 1 week- as scheduled 09/19/24  I appreciate the opportunity to participate in Adelma's care,   Pls call/ message for questions,  [place] Markey Mckinney Konstantin Lehnen, RN, BSN, CCRN Alumnus RN Care Manager  Transitions of Care  [facility] - [facility] [phone]: direct office

## 2024-09-11 NOTE — Patient Instructions (Signed)
 Visit Information  Thank you for taking time to visit with me today. Please don't hesitate to contact me if I can be of assistance to you before our next scheduled telephone appointment.  As requested: the number to the [facility] agency is:  [phone]  Our next appointment is by telephone on Thursday, September 19, 2024 at 10:15 am  Please call the care guide team at [phone] if you need to cancel or reschedule your appointment.   Following are the goals we discussed today:  Patient Self Care Activities:  Attend all scheduled provider appointments Call provider office for new concerns or questions  Participate in Transition of Care Program/Attend TOC scheduled calls Take medications as prescribed   Use assistive devices as needed to prevent falls- your new walker Please go over all of your medications and medication question/ concerns with your care providers (doctor) when you visit with them after your recent hospital visit If you believe your condition is getting worse- contact your care providers (doctors) promptly- reaching out to your doctor early when you have concerns can prevent you from having to go to the hospital  If you are experiencing a Mental Health or Behavioral Health Crisis or need someone to talk to, please  call the Suicide and Crisis Lifeline: 988 call the [place]  National Suicide Prevention Lifeline: 775-270-7730 or TTY: 251-517-6447 TTY (914) 429-2201) to talk to a trained counselor call 1-800-273-TALK (toll free, 24 hour hotline) go to [facility] [address], [place] [phone]) call the [facility] Crisis Line: 308-605-2611 call 911   Care plan and visit instructions communicated with the patient's caregiver verbally today. Caregiver agrees to receive a copy in MyChart. Active MyChart status and patient understanding of how to access instructions and care plan via MyChart confirmed with caregiver.      Pls call/ message for questions,  Koray Soter Mckinney Aurther Harlin, RN, BSN, CCRN Alumnus RN Care Manager  Transitions of Care  [facility] [phone]: direct office

## 2024-09-13 ENCOUNTER — Ambulatory Visit: Payer: Self-pay | Admitting: Nurse Practitioner

## 2024-09-13 ENCOUNTER — Ambulatory Visit: Payer: Self-pay

## 2024-09-13 ENCOUNTER — Ambulatory Visit: Admitting: Nurse Practitioner

## 2024-09-13 VITALS — BP 122/78 | HR 62 | Temp 97.7°F | Ht 62.5 in | Wt 136.6 lb

## 2024-09-13 DIAGNOSIS — R410 Disorientation, unspecified: Secondary | ICD-10-CM

## 2024-09-13 LAB — POC URINALSYSI DIPSTICK (AUTOMATED)
Bilirubin, UA: NEGATIVE
Blood, UA: NEGATIVE
Glucose, UA: NEGATIVE
Ketones, UA: NEGATIVE
Leukocytes, UA: NEGATIVE
Nitrite, UA: NEGATIVE
Protein, UA: POSITIVE — AB
Spec Grav, UA: 1.015 (ref 1.010–1.025)
Urobilinogen, UA: 0.2 U/dL
pH, UA: 7 (ref 5.0–8.0)

## 2024-09-13 NOTE — Telephone Encounter (Signed)
 Noted, thank you for seeing.

## 2024-09-13 NOTE — Telephone Encounter (Signed)
 FYI Only or Action Required?: FYI only for provider: appointment scheduled on this morning.  Patient was last seen in primary care on 09/10/2024 by Avelina Greig BRAVO, MD.  Called Nurse Triage reporting Altered Mental Status and Dysuria.  Symptoms began several days ago.  Interventions attempted: Nothing.  Symptoms are: gradually worsening.  Triage Disposition: See HCP Within 4 Hours (Or PCP Triage)  Patient/caregiver understands and will follow disposition?: yes                        Copied from CRM #[id number]. Topic: Clinical - Red Word Triage >> Sep 13, 2024  8:39 AM Pinkey ORN wrote: Red Word that prompted transfer to Nurse Triage: Severe UTI + Confusion Reason for Disposition  [1] SEVERE pain with urination (e.g., excruciating) AND [2] not improved after 2 hours of pain medicine  Answer Assessment - Initial Assessment Questions Call from both daughters. Pt was recently released from hospital for UTI. Family members notice that pt was acting confused and had upper back pain. Urine was tested at home and a UTI was indicated.     1. SEVERITY: How bad is the pain?  (e.g., Scale 1-10; mild, moderate, or severe)     unsure 2. FREQUENCY: How many times have you had painful urination today?      unsure 3. PATTERN: Is pain present every time you urinate or just sometimes?      unsure 4. ONSET: When did the painful urination start?      unsure 5. FEVER: Do you have a fever? If Yes, ask: What is your temperature, how was it measured, and when did it start?     no 6. PAST UTI: Have you had a urine infection before? If Yes, ask: When was the last time? and What happened that time?      yes 7. CAUSE: What do you think is causing the painful urination?  (e.g., UTI, scratch, Herpes sore)     UTI - tested with test strips 8. OTHER SYMPTOMS: Do you have any other symptoms? (e.g., blood in urine, flank pain, genital sores, urgency, vaginal  discharge)     Upper left quad of back has pain  Protocols used: Urination Pain - Female-A-AH

## 2024-09-13 NOTE — Progress Notes (Signed)
 Please inform pt the Dipstick negative for infection.

## 2024-09-18 DIAGNOSIS — R410 Disorientation, unspecified: Secondary | ICD-10-CM | POA: Insufficient documentation

## 2024-09-18 NOTE — Assessment & Plan Note (Signed)
 Moderate dementia with persistent confusion and disorientation post-UTI hospitalization. Environmental changes may contribute. Aricept  discontinued. No current neurology follow-up. - Referred to neurology for further evaluation and management. Orders:   Ambulatory referral to Neurology

## 2024-09-18 NOTE — Progress Notes (Signed)
 Established Patient Office Visit  Subjective:  Patient ID: [name], female    DOB: 1941/01/31  Age: 83 y.o. MRN: [id number]  CC:  Chief Complaint  Patient presents with   Acute Visit    Confusion  At home UTI test on 09/12/24 was Positive   Discussed the use of AI scribe software for clinical note transcription with the patient, who gave verbal consent to proceed.  History of Present Illness   [name] is an 83 year old female with moderate dementia who presents with confusion and possible recurrent urinary tract infection. She is accompanied by her daughters.  She was hospitalized for a UTI and discharged three days ago. She completed a course of antibiotics ending three days after discharge, but she has not returned to her baseline mental status and remains confused noted by her daughter. Her niece obtained a positive UTI test strip at home. She saw a doctor on Tuesday and no further antibiotics were prescribed, and her confusion has persisted since the hospital stay.  She currently has no fever, dysuria, or increased urinary frequency. She feels an urge to void with low urine output. Denise fever.   She has moderate dementia and previously took Aricept , which was stopped because of significant weight loss and poor appetite. She now lives with her niece, and the recent change in living situation is challenging and thought by her daughter to be increasing her confusion.      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 cath 2/09: no CAD, EF 70%   GERD (gastroesophageal reflux disease) 07/26/2021   HYPERLIPIDEMIA    HYPERTENSION    Hypertensive urgency 07/19/2022   Mild Aortic insufficiency    Moderate mitral regurgitation    Moderate tricuspid regurgitation    OSTEOPENIA    PAF (paroxysmal atrial fibrillation) (HCC)     a. s/p ablation 2013; b. CHADS2VASc -> 4 (HTN, age x 2, female)-->Eliquis .   PAT (paroxysmal atrial tachycardia)    a. 04/2019 Zio: Occas PACs and rare PVCs. 21 atrial runs - longest 20 beats, max rate 169.   Pneumonia 2009   RA (rheumatoid arthritis) (HCC)    Sinus Bradycardia    a. asymptomatic but prevents use of AVN blocking agents; b. 04/2019 Zio: Avg HR 61 (37-109).   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 05/2019 Echo: EF 60-65%. DD. RVSP 41.16mmHg. Mod dil LA. Mod MR/TR. Mild AI.    Past Surgical History:  Procedure Laterality Date   ABLATION OF DYSRHYTHMIC FOCUS     CARDIAC CATHETERIZATION     COLONOSCOPY  04/18/08   COLONOSCOPY WITH PROPOFOL  N/A 02/28/2023   Procedure: COLONOSCOPY WITH PROPOFOL ;  Surgeon: Unk Corinn Skiff, MD;  Location: [facility];  Service: Gastroenterology;  Laterality: N/A;   ESOPHAGOGASTRODUODENOSCOPY (EGD) WITH PROPOFOL  N/A 02/28/2023   Procedure: ESOPHAGOGASTRODUODENOSCOPY (EGD) WITH PROPOFOL ;  Surgeon: Unk Corinn Skiff, MD;  Location: [facility] ENDOSCOPY;  Service: Gastroenterology;  Laterality: N/A;   Partial hysterectomy--1979     Thoracentesis   12/20/07      Family History  Problem Relation Age of Onset   Diabetes Other    Breast cancer Paternal Aunt 72   Heart disease Mother    Pancreatic cancer Father    Atrial fibrillation Brother    Heart disease  Brother    Heart attack Brother     Social History   Socioeconomic History   Marital status: Married    Spouse name: Not on file   Number of children: Not on file   Years of education: Not on file   Highest education level: Not on file  Occupational History   Not on file  Tobacco Use   Smoking status: Never   Smokeless tobacco: Never  Vaping Use   Vaping status: Never Used  Substance and Sexual Activity   Alcohol use: No    Alcohol/week: 0.0 standard drinks of alcohol   Drug use: No   Sexual activity: Not Currently    Birth control/protection: None  Other  Topics Concern   Not on file  Social History Narrative   Marital Status: widow x 1 yrChildren: 5, grandchildren 61, numerous great grand 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 Full Code ( reviewed 2015) Occasional exercise.Diet: fruits and veggies, lean meats.   Right handed    Caffeine- none    Social Drivers of Health   Financial Resource Strain: Low Risk  (07/11/2024)   Overall Financial Resource Strain (CARDIA)    Difficulty of Paying Living Expenses: Not hard at all  Food Insecurity: No Food Insecurity (09/02/2024)   Hunger Vital Sign    Worried About Running Out of Food in the Last Year: Never true    Ran Out of Food in the Last Year: Never true  Transportation Needs: No Transportation Needs (09/02/2024)   PRAPARE - Administrator, Civil Service (Medical): No    Lack of Transportation (Non-Medical): No  Physical Activity: Inactive (07/11/2024)   Exercise Vital Sign    Days of Exercise per Week: 0 days    Minutes of Exercise per Session: 0 min  Stress: No Stress Concern Present (07/11/2024)   [employer] of Occupational Health - Occupational Stress Questionnaire    Feeling of Stress: Not at all  Social Connections: Socially Integrated (08/25/2024)   Social Connection and Isolation Panel    Frequency of Communication with Friends and Family: More than three times a week    Frequency of Social Gatherings with Friends and Family: More than three times a week    Attends Religious Services: More than 4 times per year    Active Member of [employer] or Organizations: Yes    Attends Banker Meetings: Never    Marital Status: Married  Catering Manager Violence: Patient Unable To Answer (09/02/2024)    Humiliation, Afraid, Rape, and Kick questionnaire    Fear of Current or Ex-Partner: Patient unable to answer    Emotionally Abused: Patient unable to answer    Physically Abused: Patient unable to answer    Sexually Abused: Patient unable to answer     Outpatient Medications Prior to Visit  Medication Sig Dispense Refill   acetaminophen  (TYLENOL ) 325 MG tablet Take 1 tablet (325 mg total) by mouth every 6 (six) hours as needed for mild pain (pain score 1-3). 90 tablet 0   amiodarone  (PACERONE ) 200 MG tablet TAKE 1/2 TABLET (100 MG TOTAL) BY MOUTH ON MONDAY, TUESDAY, THURSDAY, FRIDAY ANDSATURDAY 8 tablet 0   doxazosin  (CARDURA ) 2 MG tablet TAKE 1 TABLET BY MOUTH DAILY 90 tablet 3   ELIQUIS  2.5 MG TABS tablet TAKE 1 TABLET BY MOUTH TWICE A DAY  180 tablet 3   furosemide  (LASIX ) 20 MG tablet Take 0.5-1 tablets (10-20 mg total) by mouth daily as needed. 15 tablet 0   gabapentin  (NEURONTIN ) 100 MG capsule Take 1 capsule (100 mg total) by mouth 2 (two) times daily. 180 capsule 1   hydrALAZINE  (APRESOLINE ) 50 MG tablet TAKE 1 TABLET BY MOUTH EVERY 8 HOURS. 270 tablet 3   lisinopril  (ZESTRIL ) 10 MG tablet Take 1 tablet (10 mg total) by mouth daily. 90 tablet 1   LUMIGAN 0.01 % SOLN Apply 1 drop to eye at bedtime.     Melatonin 10 MG CAPS Take 10 mg by mouth at bedtime. 30 capsule 0   methocarbamol  (ROBAXIN ) 500 MG tablet Take 1 tablet (500 mg total) by mouth 3 (three) times daily as needed for muscle spasms. Take 500 mg by mouth 3 (three) times daily. 30 tablet 0   ondansetron  (ZOFRAN ) 4 MG tablet Take 1-2 tablets (4-8 mg total) by mouth every 8 (eight) hours as needed for nausea or vomiting. 30 tablet 0   pantoprazole  (PROTONIX ) 40 MG tablet TAKE 1 TABLET BY MOUTH DAILY 30 tablet 11   rosuvastatin  (CRESTOR ) 10 MG tablet TAKE 1 TABLET BY MOUTH DAILY 90 tablet 0   senna-docusate (SENOKOT-S) 8.6-50 MG tablet Take 1 tablet by mouth at bedtime as needed for mild constipation. 20 tablet 0   SIMBRINZA  1-0.2 % SUSP Place 1 drop into both eyes daily.     sucralfate  (CARAFATE ) 1 g tablet Take 1 tablet (1 g total) by mouth 4 (four) times daily -  with meals and at bedtime. 120 tablet 3   No facility-administered medications prior to visit.    Allergies  Allergen Reactions   Amlodipine      Ankle swelling   Celecoxib Other (See Comments) and Rash    Tachycardia/palpitations Other reaction(s): Other Tachycardia/palpitations Tachycardia/palpitations   Tramadol  Nausea Only    ROS Review of Systems Negative unless indicated in HPI.    Objective:    Physical Exam Constitutional:      Appearance: Normal appearance.  Cardiovascular:     Rate and Rhythm: Normal rate and regular rhythm.     Pulses: Normal pulses.     Heart sounds: Normal heart sounds.  Abdominal:     General: Bowel sounds are normal.     Palpations: Abdomen is soft.     Tenderness: There is no abdominal tenderness. There is no right CVA tenderness or left CVA tenderness.  Musculoskeletal:     Cervical back: Normal range of motion.  Neurological:     General: No focal deficit present.     Mental Status: She is alert and oriented to person, place, and time.  Psychiatric:        Mood and Affect: Mood normal.        Behavior: Behavior normal.        Thought Content: Thought content normal.        Judgment: Judgment normal.     BP 122/78   Pulse 62   Temp 97.7 F (36.5 C)   Ht 5' 2.5 (1.588 m)   Wt 136 lb 9.6 oz (62 kg)   SpO2 98%   BMI 24.59 kg/m  Wt Readings from Last 3 Encounters:  09/13/24 136 lb 9.6 oz (62 kg)  09/10/24 142 lb 6 oz (64.6 kg)  08/25/24 131 lb 6.3 oz (59.6 kg)     Health Maintenance  Topic Date Due   Zoster Vaccines- Shingrix (1 of 2) Never done  Mammogram  01/20/2019   Bone Density Scan  04/27/2020   COVID-19 Vaccine (7 - 2025-26 season) 06/10/2024   Medicare Annual Wellness (AWV)  07/11/2025   Colonoscopy  02/28/2028   DTaP/Tdap/Td (3 - Tdap) 02/12/2029   Pneumococcal  Vaccine: 50+ Years  Completed   Influenza Vaccine  Completed   Meningococcal B Vaccine  Aged Out   Hepatitis C Screening  Discontinued    There are no preventive care reminders to display for this patient.  Lab Results  Component Value Date   TSH 1.71 06/18/2024   Lab Results  Component Value Date   WBC 6.0 09/01/2024   HGB 8.2 (L) 09/04/2024   HCT 24.8 (L) 09/04/2024   MCV 91.4 09/01/2024   PLT 228 09/01/2024   Lab Results  Component Value Date   NA 142 09/10/2024   K 4.3 09/10/2024   CO2 30 09/10/2024   GLUCOSE 89 09/10/2024   BUN 22 09/10/2024   CREATININE 1.99 (H) 09/10/2024   BILITOT 0.8 08/25/2024   ALKPHOS 61 08/25/2024   AST 19 08/25/2024   ALT 9 08/25/2024   PROT 5.8 (L) 08/25/2024   ALBUMIN 3.1 (L) 08/25/2024   CALCIUM  9.6 09/10/2024   ANIONGAP 6 09/01/2024   EGFR 28.0 11/23/2023   GFR 23.02 (L) 09/10/2024   Lab Results  Component Value Date   CHOL 205 (H) 08/18/2021   Lab Results  Component Value Date   HDL 78 08/18/2021   Lab Results  Component Value Date   LDLCALC 118 (H) 08/18/2021   Lab Results  Component Value Date   TRIG 47 08/18/2021   Lab Results  Component Value Date   CHOLHDL 2.6 08/18/2021   Lab Results  Component Value Date   HGBA1C 5.3 03/22/2022      Assessment & Plan:   Assessment & Plan Confusion Patient was unable to provide urine sample in the office.  Urine sample dropped off. UA negative for nitrite, leukocyte or hematuria. - Advised to follow-up if symptoms not improving. 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 Environmental changes may contribute. Aricept  discontinued. No current neurology follow-up. - Referred to neurology for further evaluation and management. Orders:   Ambulatory  referral to Neurology   Assessment and Plan        Follow-up: No follow-ups on file.   Grayer Sproles, NP

## 2024-09-18 NOTE — Assessment & Plan Note (Signed)
 Patient was unable to provide urine sample in the office.  Urine sample dropped off. UA negative for nitrite, leukocyte or hematuria. - Advised to follow-up if symptoms not improving. Orders:   POCT Urinalysis Dipstick (Automated)

## 2024-09-19 ENCOUNTER — Other Ambulatory Visit: Payer: Self-pay | Admitting: *Deleted

## 2024-09-19 NOTE — Patient Instructions (Signed)
 Visit Information  Thank you for taking time to visit with me today. Please don't hesitate to contact me if I can be of assistance to you before our next scheduled telephone appointment.  Our next appointment is by telephone on Thursday, September 26, 2024 at 10:00 am  Please call the care guide team at [phone] if you need to cancel or reschedule your appointment.   Following are the goals we discussed today:  Patient Self Care Activities:  Attend all scheduled provider appointments Call provider office for new concerns or questions  Participate in Transition of Care Program/Attend TOC scheduled calls Take medications as prescribed   Use assistive devices as needed to prevent falls- your new walker Please go over all of your medications and medication question/ concerns with your care providers (doctor) when you visit with them after your recent hospital visit If you believe your condition is getting worse- contact your care providers (doctors) promptly- reaching out to your doctor early when you have concerns can prevent you from having to go to the hospital  If you are experiencing a Mental Health or Behavioral Health Crisis or need someone to talk to, please  call the Suicide and Crisis Lifeline: 988 call the [place]  National Suicide Prevention Lifeline: 445-371-8479 or TTY: (680) 660-4887 TTY 930-398-2360) to talk to a trained counselor call 1-800-273-TALK (toll free, 24 hour hotline) go to [facility] [address], [place] [id number]) call the [facility] Crisis Line: [phone] call 911   Care plan and visit instructions communicated with the patient's daughter- caregiver verbally today. Caregiver agrees to receive a copy in MyChart. Active MyChart status and caregiver understanding of how to access instructions and care plan via MyChart confirmed.     Udell Mazzocco Mckinney Kashius Dominic, RN, BSN, Media Planner  Transitions of  Care  [facility] - [facility] [phone]: direct office

## 2024-09-19 NOTE — Transitions of Care (Post Inpatient/ED Visit) (Signed)
 Transition of Care week 3/ day # 17  Visit Note  09/19/2024  Name: [name] MRN: [id number]          DOB: 1941/02/26  Situation: Patient enrolled in [facility] 30-day program. Visit completed with patient's daughter- caregiver Ileene- verified on [facility] DPR by telephone.   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 cath 2/09: no CAD, EF 70%   GERD (gastroesophageal reflux disease) 07/26/2021   HYPERLIPIDEMIA    HYPERTENSION    Hypertensive urgency 07/19/2022   Mild Aortic insufficiency    Moderate mitral regurgitation    Moderate tricuspid regurgitation    OSTEOPENIA    PAF (paroxysmal atrial fibrillation) (HCC)    a. s/p ablation 2013; b. CHADS2VASc -> 4 (HTN, age x 2, female)-->Eliquis .   PAT (paroxysmal atrial tachycardia)    a. 04/2019 Zio: Occas PACs and rare PVCs. 21 atrial runs - longest 20 beats, max rate 169.   Pneumonia 2009   RA (rheumatoid arthritis) (HCC)    Sinus Bradycardia    a. asymptomatic but prevents use of AVN blocking agents; b. 04/2019 Zio: Avg HR 61 (37-109).   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 05/2019 Echo: EF 60-65%. DD. RVSP 41.73mmHg. Mod dil LA. Mod MR/TR. Mild AI.   Assessment:  with caregiver/ daughter:  She is doing good and having no issues: we took her back to her home with her husband and we are still checking on her daily.  Took her to the PCP NP on Friday because we thought she might have  a UTI coming on, but the test came back normal, so that is good- we are making sure she is eating and drinking right, so far no problems.  I continue to oversee her medications, and we did get our answers from the heart doctor at [facility]: she is to take the spironolactone  and we are giving it to her like they told us  to    Caregiver denies clinical concerns around patient's condition and reports patient in no distress during TOC 30-day program outreach call today  Patient Reported Symptoms: Cognitive Cognitive Status: No symptoms reported, Normal speech and language skills, Alert and oriented to person, place, and time, Requires Assistance Decision Making (per caregiver- daughter Ileene) Cognitive/Intellectual Conditions Management [RPT]: None reported or documented in medical history or problem list Other: per caregiver- daughter Ileene: she continues to manage all aspects of healthcare affairs      Neurological Neurological Review of Symptoms: No symptoms reported (per caregiver- daughter Ileene) Oher Neurological Symptoms/Conditions [RPT]: History of mild dementia per caregiver report: confirmed neurology referral placed 09/13/24 Neurological Management Strategies: Coping strategies, Routine screening, Adequate rest  HEENT HEENT Symptoms Reported: No symptoms reported (per caregiver- daughter Ileene)      Cardiovascular Cardiovascular Symptoms Reported: No symptoms reported (per caregiver- daughter Ileene) Other Cardiovascular Symptoms: Reports obtained spironolactone  as discussed during TOC call last week; swelling is much better; per caregiver report barely there if at  all Does patient have uncontrolled Hypertension?: No Cardiovascular Management Strategies: Medication therapy, Routine screening, Coping strategies, Adequate rest  Respiratory Respiratory Symptoms Reported: No symptoms reported (RN CM TOC 30-day Program [employer]; Week # Attempt #) Other Respiratory  Symptoms: Breathing fine, no episodes of shortness of breath Respiratory Management Strategies: Adequate rest, Coping strategies  Endocrine Endocrine Symptoms Reported: No symptoms reported (per daughter- caregiver report) Is patient diabetic?: No    Gastrointestinal Gastrointestinal Symptoms Reported: No symptoms reported (per daughter- caregiver report) Additional Gastrointestinal Details: Eating good; good appetite; having normal BM's Gastrointestinal Management Strategies: Coping strategies    Genitourinary Genitourinary Symptoms Reported: No symptoms reported (per daughter- caregiver report) Additional Genitourinary Details: She is peeing normally and her urine is clear yellow- we took her to the PCP office 09/13/24 because we thought she might be starting a UTI- but she wasn't- the lab work came back normal; we are making sure she stays hydrated Genitourinary Management Strategies: Coping strategies  Integumentary Integumentary Symptoms Reported: No symptoms reported (per daughter- caregiver report)    Musculoskeletal Musculoskelatal Symptoms Reviewed: No symptoms reported (per daughter- caregiver report) Additional Musculoskeletal Details: She is getting stronger with the home health PT and still not having to use a cane or a walker; no new falls Musculoskeletal Management Strategies: Routine screening, Coping strategies   Fall risk Follow up: Falls prevention discussed  Psychosocial Psychosocial Symptoms Reported: No symptoms reported (per daughter- caregiver report) Behavioral Management Strategies: Support system Major Change/Loss/Stressor/Fears (CP): Denies (per daughter- caregiver report) Techniques to Cope with Loss/Stress/Change: Diversional activities Quality of Family Relationships: helpful, involved, supportive   There were no vitals filed for this visit. Pain Scale: 0-10 (per daughter- caregiver report) Pain Score: 0-No pain  Medications Reviewed Today      Reviewed by Alda Gaultney M, RN (Registered Nurse) on 09/19/24 at 1029  Med List Status: <None>   Medication Order Taking? Sig Documenting Provider Last Dose Status Informant  acetaminophen  (TYLENOL ) 325 MG tablet [id number] Yes Take 1 tablet (325 mg total) by mouth every 6 (six) hours as needed for mild pain (pain score 1-3). Avelina Greig BRAVO, MD  Active Family Member           Med Note NIKKI HADASSAH Repress Aug 25, 2024  2:29 PM) prn  amiodarone  (PACERONE ) 200 MG tablet [id number] Yes TAKE 1/2 TABLET (100 MG TOTAL) BY MOUTH ON MONDAY, TUESDAY, THURSDAY, FRIDAY ANDSATURDAY Camnitz, Will Gladis, MD  Active Family Member  doxazosin  (CARDURA ) 2 MG tablet [id number] Yes TAKE 1 TABLET BY MOUTH DAILY Bedsole, Amy E, MD  Active Family Member  ELIQUIS  2.5 MG TABS tablet [id number] Yes TAKE 1 TABLET BY MOUTH TWICE A DAY Bedsole, Amy E, MD  Active Family Member  furosemide  (LASIX ) 20 MG tablet [id number] Yes Take 0.5-1 tablets (10-20 mg total) by mouth daily as needed. Avelina Greig BRAVO, MD  Active Family Member           Med Note NIKKI, HADASSAH Repress Aug 25, 2024  2:32 PM) prn  gabapentin  (NEURONTIN ) 100 MG capsule [id number] Yes Take 1 capsule (100 mg total) by mouth 2 (two) times daily. Avelina Greig BRAVO, MD  Active   hydrALAZINE  (APRESOLINE ) 50 MG tablet [id number] Yes TAKE 1 TABLET BY MOUTH EVERY 8 HOURS. Avelina Greig BRAVO, MD  Active Family Member  lisinopril  (ZESTRIL ) 10 MG tablet [id number] Yes Take 1 tablet (10 mg total) by mouth daily. Avelina Greig BRAVO, MD  Active   LUMIGAN 0.01 % SOLN [id number]  Yes Apply 1 drop to eye at bedtime. [provider]  Active Family Member  Melatonin 10 MG CAPS [id number] Yes Take 10 mg by mouth at bedtime. Dorinda Drue DASEN, MD  Active   methocarbamol  (ROBAXIN ) 500 MG tablet [id number] Yes Take 1 tablet (500 mg total) by mouth 3 (three) times daily as needed for muscle spasms. Take 500 mg by mouth 3 (three) times daily. Avelina Greig BRAVO, MD  Active Family Member           Med Note  NIKKI, HADASSAH Repress Aug 25, 2024  2:32 PM) prn  ondansetron  (ZOFRAN ) 4 MG tablet [id number] Yes Take 1-2 tablets (4-8 mg total) by mouth every 8 (eight) hours as needed for nausea or vomiting. Avelina Greig BRAVO, MD  Active Family Member  pantoprazole  (PROTONIX ) 40 MG tablet [id number] Yes TAKE 1 TABLET BY MOUTH DAILY Bedsole, Amy E, MD  Active Family Member  rosuvastatin  (CRESTOR ) 10 MG tablet [id number] Yes TAKE 1 TABLET BY MOUTH DAILY Bedsole, Amy E, MD  Active Family Member  senna-docusate (SENOKOT-S) 8.6-50 MG tablet [id number] Yes Take 1 tablet by mouth at bedtime as needed for mild constipation. Dorinda Drue DASEN, MD  Active   SIMBRINZA 1-0.2 % SUSP [id number] Yes Place 1 drop into both eyes daily. [provider]  Active Family Member  sucralfate  (CARAFATE ) 1 g tablet [id number] Yes Take 1 tablet (1 g total) by mouth 4 (four) times daily -  with meals and at bedtime. Avelina Greig BRAVO, MD  Active Family Member           Recommendation:   Specialty provider follow-up: oncology- hematology provider as scheduled 09/23/24: retacrit  injection Continue Current Plan of Care  Follow Up Plan:   Telephone follow-up in 1 week- as scheduled 09/26/24  Pls call/ message for questions,  Beatris Blinda Lawrence, RN, BSN, CCRN Alumnus RN Care Manager  Transitions of Care  [facility] - [facility] [phone]: direct office

## 2024-09-21 ENCOUNTER — Other Ambulatory Visit: Payer: Self-pay | Admitting: Family Medicine

## 2024-09-21 NOTE — Assessment & Plan Note (Signed)
 New, bilateral, no focal pain.  No masses noted on breast exam. Will refer for diagnostic mammogram.

## 2024-09-23 ENCOUNTER — Ambulatory Visit

## 2024-09-23 ENCOUNTER — Inpatient Hospital Stay: Attending: Oncology

## 2024-09-23 VITALS — BP 162/81 | HR 64

## 2024-09-23 DIAGNOSIS — N1832 Chronic kidney disease, stage 3b: Secondary | ICD-10-CM | POA: Insufficient documentation

## 2024-09-23 DIAGNOSIS — D509 Iron deficiency anemia, unspecified: Secondary | ICD-10-CM | POA: Diagnosis not present

## 2024-09-23 DIAGNOSIS — D631 Anemia in chronic kidney disease: Secondary | ICD-10-CM | POA: Diagnosis present

## 2024-09-23 LAB — HEMOGLOBIN AND HEMATOCRIT (CANCER CENTER ONLY)
HCT: 28.5 % — ABNORMAL LOW (ref 36.0–46.0)
Hemoglobin: 9.4 g/dL — ABNORMAL LOW (ref 12.0–15.0)

## 2024-09-23 MED ORDER — EPOETIN ALFA-EPBX 10000 UNIT/ML IJ SOLN
10000.0000 [IU] | Freq: Once | INTRAMUSCULAR | Status: AC
  Administered 2024-09-23: 14:00:00 10000 [IU] via SUBCUTANEOUS
  Filled 2024-09-23: qty 1

## 2024-09-23 NOTE — Telephone Encounter (Signed)
 Last office visit 09/10/2024 for hospital follow up.  Last refilled 06/04/2024 for #120 with 3 refills.  Next Appt: 01/07/25 for 6 month follow up.

## 2024-09-26 ENCOUNTER — Other Ambulatory Visit: Admitting: *Deleted

## 2024-09-26 NOTE — Transitions of Care (Post Inpatient/ED Visit) (Signed)
 Transition of Care week 4/ day # 24 TOC 30-day program case closure: goals met  Visit Note  09/26/2024  Name: [name] MRN: [id number]          DOB: 11-02-1940  Situation: Patient enrolled in [facility] 30-day program. Visit completed with patient's daughter- caregiver Ileene, verified on [facility] D[facility] by telephone.   HIPAA identifiers x 2 verified  No further follow up required: Patient participated in TOC-30 day program and has met her previously established goals; today caregiver denies need for ongoing care management outreach and confirms she has my direct phone number should needs arise in the future  Background:  Recent hospitalization November 16-23, 2025 for AMS/ fever/ complicated UTI (1) unplanned hospital admission x last (6)/ (12) months: (1) additional unplanned hospital admission for observation x last (6) months)  Initial Transition Care Management Follow-up Telephone Call Discharge Date and Diagnosis: 09/01/24, AMS/ fever; complicated UTI   Past Medical History:  Diagnosis Date   Anemia    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 cath 2/09: no CAD, EF 70%   GERD (gastroesophageal reflux disease) 07/26/2021   HYPERLIPIDEMIA    HYPERTENSION    Hypertensive urgency 07/19/2022   Mild Aortic insufficiency    Moderate mitral regurgitation    Moderate tricuspid regurgitation    OSTEOPENIA    PAF (paroxysmal atrial fibrillation) (HCC)    a. s/p ablation 2013; b. CHADS2VASc -> 4 (HTN, age x 2, female)-->Eliquis .   PAT (paroxysmal atrial tachycardia)    a. 04/2019 Zio: Occas PACs and rare PVCs. 21 atrial runs - longest 20 beats, max rate 169.   Pneumonia 2009   RA (rheumatoid arthritis) (HCC)    Sinus Bradycardia    a. asymptomatic but prevents use of AVN blocking agents; b. 04/2019 Zio: Avg HR 61 (37-109).   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 05/2019  Echo: EF 60-65%. DD. RVSP 41.21mmHg. Mod dil LA. Mod MR/TR. Mild AI.   Assessment:  per caregiver/ daughter:  She is doing really good and having no issues at all: still home with her husband and we are still checking on her daily.  No falls and eating good.  I am planning to make an appointment with her PCP after the holidays to discuss her medications, just want to make sure everything is how it is supposed to be- I have no concerns, just want to see if we can decrease some of them.  I don't think we need any more calls to check in because she is doing so well    Caregiver denies clinical concerns around patient's condition and reports patient in no distress during TOC 30-day program outreach call today  Patient Reported Symptoms: Cognitive Cognitive Status: No symptoms reported, Alert and oriented to person, place, and time, Normal speech and language skills, Requires Assistance Decision Making (per caregiver/ daughter report)      Neurological Neurological Review of Symptoms: No symptoms reported (per caregiver/ daughter report)    HEENT HEENT Symptoms Reported: No symptoms reported (per caregiver/ daughter report)      Cardiovascular Cardiovascular Symptoms Reported: No symptoms reported (per caregiver/ daughter report) Does patient have uncontrolled Hypertension?: No Cardiovascular Management Strategies: Medication therapy, Routine screening, Coping strategies, Adequate rest  Respiratory Respiratory Symptoms Reported: No symptoms reported (per caregiver/ daughter report) Other Respiratory Symptoms: She is breathing fine and I  have no concerns    Endocrine Endocrine Symptoms Reported: No symptoms reported (per caregiver/ daughter report) Is patient diabetic?: No    Gastrointestinal Gastrointestinal Symptoms Reported: No symptoms reported (per caregiver/ daughter report) Additional Gastrointestinal Details: Eating good and no constipation, normal BM's      Genitourinary  Genitourinary Symptoms Reported: No symptoms reported (per caregiver/ daughter report)    Integumentary Integumentary Symptoms Reported: No symptoms reported (per caregiver/ daughter report)    Musculoskeletal Musculoskelatal Symptoms Reviewed: No symptoms reported (per caregiver/ daughter report) Additional Musculoskeletal Details: Confirmed no new/ recent falls since last [facility] outreach; still not requiring assistive devices for ambulation Musculoskeletal Management Strategies: Routine screening, Coping strategies   Fall risk Follow up: Falls prevention discussed  Psychosocial           There were no vitals filed for this visit. Pain Scale: 0-10 (per caregiver/ daughter report) Pain Score: 0-No pain  Medications Reviewed Today     Reviewed by Ashyah Quizon M, RN (Registered Nurse) on 09/26/24 at 1011  Med List Status: <None>   Medication Order Taking? Sig Documenting Provider Last Dose Status Informant  acetaminophen  (TYLENOL ) 325 MG tablet [id number]  Take 1 tablet (325 mg total) by mouth every 6 (six) hours as needed for mild pain (pain score 1-3). Avelina Greig BRAVO, MD  Active Family Member           Med Note NIKKI HADASSAH Repress Aug 25, 2024  2:29 PM) prn  amiodarone  (PACERONE ) 200 MG tablet [id number]  TAKE 1/2 TABLET (100 MG TOTAL) BY MOUTH ON MONDAY, TUESDAY, THURSDAY, FRIDAY ANDSATURDAY Camnitz, Will Gladis, MD  Active Family Member  doxazosin  (CARDURA ) 2 MG tablet [id number]  TAKE 1 TABLET BY MOUTH DAILY Bedsole, Amy E, MD  Active Family Member  ELIQUIS  2.5 MG TABS tablet [id number]  TAKE 1 TABLET BY MOUTH TWICE A DAY Bedsole, Amy E, MD  Active Family Member  furosemide  (LASIX ) 20 MG tablet [id number]  Take 0.5-1 tablets (10-20 mg total) by mouth daily as needed. Avelina Greig BRAVO, MD  Active Family Member           Med Note NIKKI, HADASSAH Repress Aug 25, 2024  2:32 PM) prn  gabapentin  (NEURONTIN ) 100 MG capsule [id number]  Take 1 capsule (100 mg total) by mouth 2 (two) times daily.  Avelina Greig BRAVO, MD  Active   hydrALAZINE  (APRESOLINE ) 50 MG tablet [id number]  TAKE 1 TABLET BY MOUTH EVERY 8 HOURS. Avelina Greig BRAVO, MD  Active Family Member  lisinopril  (ZESTRIL ) 10 MG tablet [id number]  Take 1 tablet (10 mg total) by mouth daily. Bedsole, Amy E, MD  Active   LUMIGAN 0.01 % SOLN [id number]  Apply 1 drop to eye at bedtime. [provider]  Active Family Member  Melatonin 10 MG CAPS [id number]  Take 10 mg by mouth at bedtime. Dorinda Drue DASEN, MD  Active   methocarbamol  (ROBAXIN ) 500 MG tablet [id number]  Take 1 tablet (500 mg total) by mouth 3 (three) times daily as needed for muscle spasms. Take 500 mg by mouth 3 (three) times daily. Avelina Greig BRAVO, MD  Active Family Member           Med Note NIKKI, HADASSAH Repress Aug 25, 2024  2:32 PM) prn  ondansetron  (ZOFRAN ) 4 MG tablet [id number]  Take 1-2 tablets (4-8 mg total) by mouth every 8 (eight) hours as needed for nausea or vomiting. Avelina Greig BRAVO, MD  Active Family Member  pantoprazole  (  PROTONIX ) 40 MG tablet [id number]  TAKE 1 TABLET BY MOUTH DAILY Bedsole, Amy E, MD  Active Family Member  rosuvastatin  (CRESTOR ) 10 MG tablet [id number]  TAKE 1 TABLET BY MOUTH DAILY Bedsole, Amy E, MD  Active Family Member  senna-docusate (SENOKOT-S) 8.6-50 MG tablet [id number]  Take 1 tablet by mouth at bedtime as needed for mild constipation. Dorinda Drue DASEN, MD  Active   SIMBRINZA 1-0.2 % SUSP [id number]  Place 1 drop into both eyes daily. [provider]  Active Family Member  sucralfate  (CARAFATE ) 1 g tablet [id number]  Take 1 tablet (1 g total) by mouth 4 (four) times daily -  with meals and at bedtime. Avelina Greig BRAVO, MD  Active            Recommendation:   Continue Current Plan of Care  Follow Up Plan:   Closing From:  Transitions of Care Program Patient has met all care management goals. Care Management case will be closed. Patient has been provided contact information should new needs arise.   Pls call/ message for  questions,  Shylie Polo Mckinney Sharif Rendell, RN, BSN, CCRN Alumnus RN Care Manager  Transitions of Care  [facility] [phone]: direct office

## 2024-09-26 NOTE — Patient Instructions (Signed)
 Visit Information  Thank you for taking time to visit with me today. Please don't hesitate to contact me if I can be of assistance to you before our next scheduled telephone appointment.  It has been a pleasure working with you over the last 30 days!  Great job managing your care after your hospital visit!  I am glad that you are doing well!  Please do not hesitate to contact me in the future if I can be of assistance to you!  Following are the goals we discussed today:  Patient Self Care Activities:  Attend all scheduled provider appointments Call provider office for new concerns or questions  Take medications as prescribed   Use assistive devices as/ if needed to prevent falls- your new walker Please go over all of your medications and medication question/ concerns with your care providers (doctor) when you have your next office visit with them  If you believe your condition is getting worse- contact your care providers (doctors) promptly- reaching out to your doctor early when you have concerns can prevent you from having to go to the hospital  If you are experiencing a Mental Health or Behavioral Health Crisis or need someone to talk to, please  call the Suicide and Crisis Lifeline: 988 call the [place]  National Suicide Prevention Lifeline: (912)298-1992 or TTY: 289 325 4693 TTY 980-827-6821) to talk to a trained counselor call 1-800-273-TALK (toll free, 24 hour hotline) go to [facility] [address], [place] [phone]) call the [facility] Crisis Line: (628)494-2460 call 911   Care plan and visit instructions communicated with the patient's caregiver verbally today. Caregiver agrees to receive a copy in MyChart. Active MyChart status and patient understanding of how to access instructions and care plan via MyChart confirmed.     Jacqueline Cragle Mckinney Jakiyah Stepney, RN, BSN, Media Planner  Transitions of Care  [facility] [phone]: direct office

## 2024-10-06 ENCOUNTER — Other Ambulatory Visit: Payer: Self-pay

## 2024-10-06 ENCOUNTER — Emergency Department
Admission: EM | Admit: 2024-10-06 | Discharge: 2024-10-07 | Disposition: A | Attending: Emergency Medicine | Admitting: Emergency Medicine

## 2024-10-06 ENCOUNTER — Emergency Department

## 2024-10-06 DIAGNOSIS — I129 Hypertensive chronic kidney disease with stage 1 through stage 4 chronic kidney disease, or unspecified chronic kidney disease: Secondary | ICD-10-CM | POA: Diagnosis not present

## 2024-10-06 DIAGNOSIS — R531 Weakness: Secondary | ICD-10-CM | POA: Diagnosis not present

## 2024-10-06 DIAGNOSIS — M545 Low back pain, unspecified: Secondary | ICD-10-CM | POA: Insufficient documentation

## 2024-10-06 DIAGNOSIS — N189 Chronic kidney disease, unspecified: Secondary | ICD-10-CM | POA: Diagnosis not present

## 2024-10-06 DIAGNOSIS — F0390 Unspecified dementia without behavioral disturbance: Secondary | ICD-10-CM | POA: Insufficient documentation

## 2024-10-06 DIAGNOSIS — R001 Bradycardia, unspecified: Secondary | ICD-10-CM | POA: Diagnosis not present

## 2024-10-06 DIAGNOSIS — N1832 Chronic kidney disease, stage 3b: Secondary | ICD-10-CM | POA: Diagnosis not present

## 2024-10-06 DIAGNOSIS — M25552 Pain in left hip: Secondary | ICD-10-CM | POA: Diagnosis not present

## 2024-10-06 DIAGNOSIS — R0789 Other chest pain: Secondary | ICD-10-CM | POA: Diagnosis not present

## 2024-10-06 DIAGNOSIS — Z7901 Long term (current) use of anticoagulants: Secondary | ICD-10-CM | POA: Diagnosis not present

## 2024-10-06 DIAGNOSIS — R079 Chest pain, unspecified: Secondary | ICD-10-CM | POA: Diagnosis present

## 2024-10-06 LAB — URINALYSIS, ROUTINE W REFLEX MICROSCOPIC
Bacteria, UA: NONE SEEN
Bilirubin Urine: NEGATIVE
Glucose, UA: NEGATIVE mg/dL
Hgb urine dipstick: NEGATIVE
Ketones, ur: NEGATIVE mg/dL
Nitrite: NEGATIVE
Protein, ur: NEGATIVE mg/dL
Specific Gravity, Urine: 1.01 (ref 1.005–1.030)
pH: 5 (ref 5.0–8.0)

## 2024-10-06 LAB — CBC
HCT: 29.3 % — ABNORMAL LOW (ref 36.0–46.0)
Hemoglobin: 9.5 g/dL — ABNORMAL LOW (ref 12.0–15.0)
MCH: 30.6 pg (ref 26.0–34.0)
MCHC: 32.4 g/dL (ref 30.0–36.0)
MCV: 94.5 fL (ref 80.0–100.0)
Platelets: 149 K/uL — ABNORMAL LOW (ref 150–400)
RBC: 3.1 MIL/uL — ABNORMAL LOW (ref 3.87–5.11)
RDW: 14.3 % (ref 11.5–15.5)
WBC: 4.8 K/uL (ref 4.0–10.5)
nRBC: 0 % (ref 0.0–0.2)

## 2024-10-06 LAB — BASIC METABOLIC PANEL WITH GFR
Anion gap: 12 (ref 5–15)
BUN: 42 mg/dL — ABNORMAL HIGH (ref 8–23)
CO2: 21 mmol/L — ABNORMAL LOW (ref 22–32)
Calcium: 9.5 mg/dL (ref 8.9–10.3)
Chloride: 107 mmol/L (ref 98–111)
Creatinine, Ser: 2.21 mg/dL — ABNORMAL HIGH (ref 0.44–1.00)
GFR, Estimated: 22 mL/min — ABNORMAL LOW
Glucose, Bld: 88 mg/dL (ref 70–99)
Potassium: 4 mmol/L (ref 3.5–5.1)
Sodium: 141 mmol/L (ref 135–145)

## 2024-10-06 LAB — TROPONIN T, HIGH SENSITIVITY
Troponin T High Sensitivity: 23 ng/L — ABNORMAL HIGH (ref 0–19)
Troponin T High Sensitivity: 21 ng/L — ABNORMAL HIGH (ref 0–19)

## 2024-10-06 LAB — RESP PANEL BY RT-PCR (RSV, FLU A&B, COVID)  RVPGX2
Influenza A by PCR: NEGATIVE
Influenza B by PCR: NEGATIVE
Resp Syncytial Virus by PCR: NEGATIVE
SARS Coronavirus 2 by RT PCR: NEGATIVE

## 2024-10-06 NOTE — Discharge Instructions (Addendum)
 Follow-up with your primary care doctor.  Return to the ER for new, worsening, or persistent severe chest pain, back pain, difficulty walking, weakness or lightheadedness, feeling like you are going to pass out, or any other new or worsening symptoms that concern you.

## 2024-10-06 NOTE — ED Notes (Signed)
 Pt ambulated to the toilet in the room with a walker, with minimal to no assistance

## 2024-10-06 NOTE — ED Notes (Signed)
 This EDT removed pt of bed pan. Provided peri care and bed pad changed. UA collected and sent to lab.
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
## 2024-10-06 NOTE — ED Provider Notes (Signed)
 "  [facility] Provider Note    Event Date/Time   First MD Initiated Contact with Patient 10/06/24 1730     (approximate)   History   Chest Pain   HPI  [name] is a 83 y.o. female with a history of dementia, atrial fibrillation on Eliquis , CKD, hypertension, hyperlipidemia, RA who presents with chest pain, back pain, weakness.  The patient and family state that she has been having some lower back pain for some time.  She also reports left hip pain.  These are unchanged today.  During the night she started having some chest pain although states that she did not tell family members initially.  It persisted into the day although she denies having any chest pain currently.  She denies associated shortness of breath.  She does not feel dizzy or lightheaded.  She has no cough or fever.  She denies any vomiting or diarrhea.  She has no acute urinary symptoms.  She just reports feeling generally weak.  Reviewed the past medical records.  The patient was admitted to the hospitalist service in November with altered mental status and fever.  She was diagnosed with a complicated UTI and metabolic encephalopathy.   Physical Exam   Triage Vital Signs: ED Triage Vitals  Encounter Vitals Group     BP 10/06/24 1523 (!) 146/68     Girls Systolic BP Percentile --      Girls Diastolic BP Percentile --      Boys Systolic BP Percentile --      Boys Diastolic BP Percentile --      Pulse Rate 10/06/24 1523 (!) 58     Resp 10/06/24 1523 18     Temp 10/06/24 1523 98.8 F (37.1 C)     Temp src --      SpO2 10/06/24 1523 100 %     Weight --      Height --      Head Circumference --      Peak Flow --      Pain Score 10/06/24 1521 0     Pain Loc --      Pain Education --      Exclude from Growth Chart --     Most recent vital signs: Vitals:   10/06/24 2202 10/06/24 2230  BP: (!) 164/70 (!) 148/63  Pulse: (!) 43 (!) 40  Resp: 16 15  Temp:    SpO2: 100% 100%      General: Alert and oriented, no distress.  CV:  Good peripheral perfusion.  Normal heart sounds. Resp:  Normal effort. Lungs CTAB. Abd:  Soft and nontender.  No distention.  Other:  Moist mucous membranes.  EOMI.  PERRLA.  No photophobia.  No facial droop.  Normal speech.  Motor intact in all extremities.  No midline spinal tenderness.   ED Results / Procedures / Treatments   Labs (all labs ordered are listed, but only abnormal results are displayed) Labs Reviewed  BASIC METABOLIC PANEL WITH GFR - Abnormal; Notable for the following components:      Result Value   CO2 21 (*)    BUN 42 (*)    Creatinine, Ser 2.21 (*)    GFR, Estimated 22 (*)    All other components within normal limits  CBC - Abnormal; Notable for the following components:   RBC 3.10 (*)    Hemoglobin 9.5 (*)    HCT 29.3 (*)    Platelets 149 (*)  All other components within normal limits  URINALYSIS, ROUTINE W REFLEX MICROSCOPIC - Abnormal; Notable for the following components:   Color, Urine YELLOW (*)    APPearance CLEAR (*)    Leukocytes,Ua SMALL (*)    All other components within normal limits  TROPONIN T, HIGH SENSITIVITY - Abnormal; Notable for the following components:   Troponin T High Sensitivity 23 (*)    All other components within normal limits  TROPONIN T, HIGH SENSITIVITY - Abnormal; Notable for the following components:   Troponin T High Sensitivity 21 (*)    All other components within normal limits  RESP PANEL BY RT-PCR (RSV, FLU A&B, COVID)  [id number]     EKG  ED ECG REPORT I, Waylon Cassis, the attending physician, personally viewed and interpreted this ECG.  Date: 10/06/2024 EKG Time: 1523 Rate: 56 Rhythm: Sinus bradycardia versus junctional rhythm QRS Axis: normal Intervals: normal ST/T Wave abnormalities: normal Narrative Interpretation: no evidence of acute ischemia   ED ECG REPORT I, Waylon Cassis, the attending physician, personally viewed and  interpreted this ECG.  Date: 10/06/2024 EKG Time: 1823 Rate: 49 Rhythm: sinus bradycardia QRS Axis: normal Intervals: normal ST/T Wave abnormalities: normal Narrative Interpretation: no evidence of acute ischemia   RADIOLOGY  Chest x-ray: I independently viewed and interpreted the images; there is no focal consolidation or edema  PROCEDURES:  Critical Care performed: No  Procedures   MEDICATIONS ORDERED IN ED: Medications - No data to display   IMPRESSION / MDM / ASSESSMENT AND PLAN / ED COURSE  I reviewed the triage vital signs and the nursing notes.  83 year old female with PMH as noted above presents with chest pain earlier today that has resolved along with generalized weakness and some chronic back pain.  On exam she is bradycardic but with otherwise normal vital signs.  Physical exam is unremarkable for acute findings.  Differential diagnosis includes, but is not limited to, ACS, GERD, musculoskeletal pain, dehydration, electrolyte abnormality, other metabolic cause, anemia, flu or other viral syndrome, UTI.  BMP shows a stable creatinine and no acute findings.  CBC shows stable hemoglobin.  Initial troponin is borderline elevated consistent with the patient's prior results.  Will obtain a repeat troponin, respiratory panel, urinalysis, and reassess.  Patient's presentation is most consistent with acute presentation with potential threat to life or bodily function.  The patient is on the cardiac monitor to evaluate for evidence of arrhythmia and/or significant heart rate changes   ----------------------------------------- 11:07 PM on 10/06/2024 -----------------------------------------  The patient states that she is feeling significantly better.  Overall the workup has been reassuring.  BMP shows elevated creatinine, consistent with baseline.  CBC shows stable anemia.  Urinalysis is negative.  Repeat troponin showed no increase.  Respiratory panel is negative.  The  patient remains somewhat bradycardic but she is not feeling lightheaded and her blood pressure is normal to elevated.  Although I did consider whether she may benefit from inpatient admission for further monitoring given her age, since the workup is reassuring I do not feel that there is any specific indication for admission.  The patient herself would like to go home, and the family feels comfortable with discharge.  I counseled them on the results of her workup and answered all of their questions.  I gave strict return precautions, and the patient expressed understanding.  FINAL CLINICAL IMPRESSION(S) / ED DIAGNOSES   Final diagnoses:  Atypical chest pain     Rx / DC Orders   ED Discharge  Orders     None        Note:  This document was prepared using Dragon voice recognition software and may include unintentional dictation errors.    Jacolyn Pae, MD 10/06/24 2351  "

## 2024-10-06 NOTE — ED Notes (Signed)
Pt on bed pan for UA

## 2024-10-10 ENCOUNTER — Encounter: Payer: Self-pay | Admitting: Oncology

## 2024-10-15 ENCOUNTER — Ambulatory Visit (INDEPENDENT_AMBULATORY_CARE_PROVIDER_SITE_OTHER): Admitting: Family Medicine

## 2024-10-15 ENCOUNTER — Encounter: Payer: Self-pay | Admitting: Family Medicine

## 2024-10-15 VITALS — BP 154/76 | HR 60 | Temp 97.7°F | Ht 62.5 in | Wt 135.2 lb

## 2024-10-15 DIAGNOSIS — M5442 Lumbago with sciatica, left side: Secondary | ICD-10-CM

## 2024-10-15 DIAGNOSIS — I10 Essential (primary) hypertension: Secondary | ICD-10-CM | POA: Diagnosis not present

## 2024-10-15 DIAGNOSIS — G8929 Other chronic pain: Secondary | ICD-10-CM | POA: Diagnosis not present

## 2024-10-15 DIAGNOSIS — R0789 Other chest pain: Secondary | ICD-10-CM | POA: Diagnosis not present

## 2024-10-15 MED ORDER — LISINOPRIL 20 MG PO TABS: 20.0000 mg | ORAL_TABLET | Freq: Every day | ORAL | 1 refills | Status: AC

## 2024-10-15 NOTE — Assessment & Plan Note (Signed)
 Chronic, inadequate treatment with gabapentin , lidocaine  patches and Tylenol . Has received 2 epidural steroid injections from orthopedics. Husband wonders why additional steroid injections have not been done as he has had more in the past with success. Recommended follow-up with back specialist.

## 2024-10-15 NOTE — Assessment & Plan Note (Signed)
 Acute, now resolved Unclear etiology

## 2024-10-15 NOTE — Assessment & Plan Note (Addendum)
 Chronic, acute worsening No new medications, no new lab change, possibly causing feeling of heat in patient's neck. Recent thyroid  testing within the normal range  Will increase lisinopril  to 20 mg daily.  Family to follow blood pressure at home and call with measurements in 2 weeks.

## 2024-10-15 NOTE — Patient Instructions (Signed)
"   Increase lisinopril  to 20 mg daily.  Follow BP at home ... Goal < 140/90... call with measurement in 2 weeks. "

## 2024-10-15 NOTE — Progress Notes (Signed)
 "   Patient ID: [name], female    DOB: 03-01-1941, 84 y.o.   MRN: [id number]  This visit was conducted in person.  BP (!) 154/76   Pulse 60   Temp 97.7 F (36.5 C) (Temporal)   Ht 5' 2.5 (1.588 m)   Wt 135 lb 4 oz (61.3 kg)   SpO2 100%   BMI 24.34 kg/m    CC:  Chief Complaint  Patient presents with   Hospitalization Follow-up    Atypical Chest Pain-ER 10/06/2024   Complains about being hot all the time    Back of Neck    Subjective:   HPI: [name] is a 84 y.o. female with history of coronary artery disease, persistent A-fib, moderate dementia, hypertension, chronic diastolic heart failure and chronic low back pain presenting on 10/15/2024 for Hospitalization Follow-up (Atypical Chest Pain-ER 10/06/2024) and Complains about being hot all the time (Back of Neck)  Recent ED visit on October 06, 2024 for acute atypical chest pain, back pain and weakness. Blood pressure was found to be elevated at 164/70  EKG unremarkable,  sinus brady, no ST changes rate 56 Chest x-ray no acute consolidation or fluid Basic metabolic panel stable creatinine no acute changes, CBC stable hemoglobin Initial troponin was borderline elevated consistent with prior results she has had.  Repeat troponin without increase Respiratory panel, UA negative   Chronic low back pain  treated with gabapentin  and lidocaine  patches.  Tylenol  1000 mg TID  Followed by ortho... has received ESI    No further chest pain   Neck feels hot.. no neck pain  Feels hot all the time.   BP previously 118/78  On hydralazine , lisinopril , lasix  prn, cadura per cardiology.  At home BP runing 150-170s  No swelling in ankles. Wearing compression hose. BP Readings from Last 3 Encounters:  10/15/24 (!) 154/76  10/06/24 (!) 148/63  09/23/24 (!) 162/81   Lab Results  Component Value Date   TSH 1.71 06/18/2024      Relevant past medical, surgical, family and social history reviewed and updated as  indicated. Interim medical history since our last visit reviewed. Allergies and medications reviewed and updated. Outpatient Medications Prior to Visit  Medication Sig Dispense Refill   acetaminophen  (TYLENOL ) 325 MG tablet Take 1 tablet (325 mg total) by mouth every 6 (six) hours as needed for mild pain (pain score 1-3). 90 tablet 0   amiodarone  (PACERONE ) 200 MG tablet TAKE 1/2 TABLET (100 MG TOTAL) BY MOUTH ON MONDAY, TUESDAY, THURSDAY, FRIDAY ANDSATURDAY 8 tablet 0   doxazosin  (CARDURA ) 2 MG tablet TAKE 1 TABLET BY MOUTH DAILY 90 tablet 3   ELIQUIS  2.5 MG TABS tablet TAKE 1 TABLET BY MOUTH TWICE A DAY 180 tablet 3   furosemide  (LASIX ) 20 MG tablet Take 0.5-1 tablets (10-20 mg total) by mouth daily as needed. 15 tablet 0   gabapentin  (NEURONTIN ) 100 MG capsule Take 1 capsule (100 mg total) by mouth 2 (two) times daily. 180 capsule 1   hydrALAZINE  (APRESOLINE ) 50 MG tablet TAKE 1 TABLET BY MOUTH EVERY 8 HOURS. 270 tablet 3   LUMIGAN 0.01 % SOLN Apply 1 drop to eye at bedtime.     Melatonin 10 MG CAPS Take 10 mg by mouth at bedtime. 30 capsule 0   methocarbamol  (ROBAXIN ) 500 MG tablet Take 1 tablet (500 mg total) by mouth 3 (three) times daily as needed for muscle spasms. Take 500 mg by mouth 3 (three) times daily. 30 tablet  0   ondansetron  (ZOFRAN ) 4 MG tablet Take 1-2 tablets (4-8 mg total) by mouth every 8 (eight) hours as needed for nausea or vomiting. 30 tablet 0   pantoprazole  (PROTONIX ) 40 MG tablet TAKE 1 TABLET BY MOUTH DAILY 30 tablet 11   rosuvastatin  (CRESTOR ) 10 MG tablet TAKE 1 TABLET BY MOUTH DAILY 90 tablet 0   senna-docusate (SENOKOT-S) 8.6-50 MG tablet Take 1 tablet by mouth at bedtime as needed for mild constipation. 20 tablet 0   SIMBRINZA 1-0.2 % SUSP Place 1 drop into both eyes daily.     sucralfate  (CARAFATE ) 1 g tablet Take 1 tablet (1 g total) by mouth 4 (four) times daily -  with meals and at bedtime. 120 tablet 0   lisinopril  (ZESTRIL ) 10 MG tablet Take 1 tablet (10 mg  total) by mouth daily. 90 tablet 1   No facility-administered medications prior to visit.     Per HPI unless specifically indicated in ROS section below Review of Systems  Constitutional:  Negative for fatigue and fever.  HENT:  Negative for congestion.   Eyes:  Negative for pain.  Respiratory:  Negative for cough and shortness of breath.   Cardiovascular:  Negative for chest pain, palpitations and leg swelling.  Gastrointestinal:  Negative for abdominal pain.  Genitourinary:  Negative for dysuria and vaginal bleeding.  Musculoskeletal:  Negative for back pain.  Neurological:  Negative for syncope, light-headedness and headaches.  Psychiatric/Behavioral:  Negative for dysphoric mood.    Objective:  BP (!) 154/76   Pulse 60   Temp 97.7 F (36.5 C) (Temporal)   Ht 5' 2.5 (1.588 m)   Wt 135 lb 4 oz (61.3 kg)   SpO2 100%   BMI 24.34 kg/m   Wt Readings from Last 3 Encounters:  10/15/24 135 lb 4 oz (61.3 kg)  09/13/24 136 lb 9.6 oz (62 kg)  09/10/24 142 lb 6 oz (64.6 kg)      Physical Exam Constitutional:      General: She is not in acute distress.    Appearance: Normal appearance. She is well-developed. She is not ill-appearing or toxic-appearing.  HENT:     Head: Normocephalic.     Right Ear: Hearing, tympanic membrane, ear canal and external ear normal. Tympanic membrane is not erythematous, retracted or bulging.     Left Ear: Hearing, tympanic membrane, ear canal and external ear normal. Tympanic membrane is not erythematous, retracted or bulging.     Nose: No mucosal edema or rhinorrhea.     Right Sinus: No maxillary sinus tenderness or frontal sinus tenderness.     Left Sinus: No maxillary sinus tenderness or frontal sinus tenderness.     Mouth/Throat:     Pharynx: Uvula midline.  Eyes:     General: Lids are normal. Lids are everted, no foreign bodies appreciated.     Conjunctiva/sclera: Conjunctivae normal.     Pupils: Pupils are equal, round, and reactive to  light.  Neck:     Thyroid : No thyroid  mass or thyromegaly.     Vascular: No carotid bruit.     Trachea: Trachea normal.  Cardiovascular:     Rate and Rhythm: Normal rate and regular rhythm.     Pulses: Normal pulses.     Heart sounds: Normal heart sounds, S1 normal and S2 normal. No murmur heard.    No friction rub. No gallop.  Pulmonary:     Effort: Pulmonary effort is normal. No tachypnea or respiratory distress.  Breath sounds: Normal breath sounds. No decreased breath sounds, wheezing, rhonchi or rales.  Abdominal:     General: Bowel sounds are normal.     Palpations: Abdomen is soft.     Tenderness: There is no abdominal tenderness.  Musculoskeletal:     Cervical back: Normal range of motion and neck supple.  Skin:    General: Skin is warm and dry.     Findings: No rash.  Neurological:     Mental Status: Mental status is at baseline. She is confused.  Psychiatric:        Attention and Perception: Attention normal.        Mood and Affect: Mood is not anxious or depressed.        Speech: Speech normal.        Behavior: Behavior normal. Behavior is cooperative.        Thought Content: Thought content normal.        Cognition and Memory: Memory is impaired.        Judgment: Judgment normal.       Results for orders placed or performed during the hospital encounter of 10/06/24  Basic metabolic panel   Collection Time: 10/06/24  3:22 PM  Result Value Ref Range   Sodium 141 135 - 145 mmol/L   Potassium 4.0 3.5 - 5.1 mmol/L   Chloride 107 98 - 111 mmol/L   CO2 21 (L) 22 - 32 mmol/L   Glucose, Bld 88 70 - 99 mg/dL   BUN 42 (H) 8 - 23 mg/dL   Creatinine, Ser 7.78 (H) 0.44 - 1.00 mg/dL   Calcium  9.5 8.9 - 10.3 mg/dL   GFR, Estimated 22 (L) >60 mL/min   Anion gap 12 5 - 15  CBC   Collection Time: 10/06/24  3:22 PM  Result Value Ref Range   WBC 4.8 4.0 - 10.5 K/uL   RBC 3.10 (L) 3.87 - 5.11 MIL/uL   Hemoglobin 9.5 (L) 12.0 - 15.0 g/dL   HCT 70.6 (L) 63.9 - 53.9 %    MCV 94.5 80.0 - 100.0 fL   MCH 30.6 26.0 - 34.0 pg   MCHC 32.4 30.0 - 36.0 g/dL   RDW 85.6 88.4 - 84.4 %   Platelets 149 (L) 150 - 400 K/uL   nRBC 0.0 0.0 - 0.2 %  Troponin T, High Sensitivity   Collection Time: 10/06/24  3:22 PM  Result Value Ref Range   Troponin T High Sensitivity 23 (H) 0 - 19 ng/L  Urinalysis, Routine w reflex microscopic -Urine, Clean Catch   Collection Time: 10/06/24  5:37 PM  Result Value Ref Range   Color, Urine YELLOW (A) YELLOW   APPearance CLEAR (A) CLEAR   Specific Gravity, Urine 1.010 1.005 - 1.030   pH 5.0 5.0 - 8.0   Glucose, UA NEGATIVE NEGATIVE mg/dL   Hgb urine dipstick NEGATIVE NEGATIVE   Bilirubin Urine NEGATIVE NEGATIVE   Ketones, ur NEGATIVE NEGATIVE mg/dL   Protein, ur NEGATIVE NEGATIVE mg/dL   Nitrite NEGATIVE NEGATIVE   Leukocytes,Ua SMALL (A) NEGATIVE   RBC / HPF 0-5 0 - 5 RBC/hpf   WBC, UA 0-5 0 - 5 WBC/hpf   Bacteria, UA NONE SEEN NONE SEEN   Squamous Epithelial / HPF 0-5 0 - 5 /HPF  Troponin T, High Sensitivity   Collection Time: 10/06/24  5:37 PM  Result Value Ref Range   Troponin T High Sensitivity 21 (H) 0 - 19 ng/L  Resp panel by RT-PCR (RSV, Flu  A&B, Covid) Anterior Nasal Swab   Collection Time: 10/06/24  8:00 PM   Specimen: Anterior Nasal Swab  Result Value Ref Range   SARS Coronavirus 2 by RT PCR NEGATIVE NEGATIVE   Influenza A by PCR NEGATIVE NEGATIVE   Influenza B by PCR NEGATIVE NEGATIVE   Resp Syncytial Virus by PCR NEGATIVE NEGATIVE    Assessment and Plan  Chronic left-sided low back pain with left-sided sciatica Assessment & Plan: Chronic, inadequate treatment with gabapentin , lidocaine  patches and Tylenol . Has received 2 epidural steroid injections from orthopedics. Husband wonders why additional steroid injections have not been done as he has had more in the past with success. Recommended follow-up with back specialist.   Atypical chest pain Assessment & Plan: Acute, now resolved Unclear  etiology   Essential hypertension Assessment & Plan: Chronic, acute worsening No new medications, no new lab change, possibly causing feeling of heat in patient's neck. Recent thyroid  testing within the normal range  Will increase lisinopril  to 20 mg daily.  Family to follow blood pressure at home and call with measurements in 2 weeks.   Other orders -     Lisinopril ; Take 1 tablet (20 mg total) by mouth daily.  Dispense: 90 tablet; Refill: 1    Return in about 3 months (around 01/13/2025) for As scheduled.   Greig Ring, MD  "

## 2024-10-18 ENCOUNTER — Ambulatory Visit
Admission: RE | Admit: 2024-10-18 | Discharge: 2024-10-18 | Disposition: A | Source: Ambulatory Visit | Attending: Family Medicine | Admitting: Family Medicine

## 2024-10-18 DIAGNOSIS — R928 Other abnormal and inconclusive findings on diagnostic imaging of breast: Secondary | ICD-10-CM | POA: Diagnosis present

## 2024-10-18 DIAGNOSIS — N644 Mastodynia: Secondary | ICD-10-CM

## 2024-10-21 ENCOUNTER — Other Ambulatory Visit: Payer: Self-pay | Admitting: Family Medicine

## 2024-10-21 DIAGNOSIS — R928 Other abnormal and inconclusive findings on diagnostic imaging of breast: Secondary | ICD-10-CM

## 2024-10-21 NOTE — Telephone Encounter (Signed)
 Last office visit 10/15/2024 for hospital follow up.  Last refilled 10/04/2024 for #120 with no refills.  Next Appt; 01/07/25.

## 2024-10-22 ENCOUNTER — Other Ambulatory Visit: Payer: Self-pay

## 2024-10-22 DIAGNOSIS — D631 Anemia in chronic kidney disease: Secondary | ICD-10-CM

## 2024-10-23 ENCOUNTER — Inpatient Hospital Stay

## 2024-10-23 ENCOUNTER — Ambulatory Visit
Admission: RE | Admit: 2024-10-23 | Discharge: 2024-10-23 | Disposition: A | Discharge status: Home / Self Care | Source: Ambulatory Visit | Attending: Family Medicine | Admitting: Family Medicine

## 2024-10-23 ENCOUNTER — Encounter: Payer: Self-pay | Admitting: Oncology

## 2024-10-23 ENCOUNTER — Inpatient Hospital Stay: Attending: Oncology

## 2024-10-23 ENCOUNTER — Inpatient Hospital Stay: Admitting: Oncology

## 2024-10-23 VITALS — BP 138/58 | HR 57 | Temp 97.3°F | Resp 20 | Wt 138.9 lb

## 2024-10-23 DIAGNOSIS — R928 Other abnormal and inconclusive findings on diagnostic imaging of breast: Secondary | ICD-10-CM | POA: Insufficient documentation

## 2024-10-23 DIAGNOSIS — N1832 Chronic kidney disease, stage 3b: Secondary | ICD-10-CM

## 2024-10-23 DIAGNOSIS — D631 Anemia in chronic kidney disease: Secondary | ICD-10-CM | POA: Diagnosis present

## 2024-10-23 HISTORY — PX: BREAST BIOPSY: SHX20

## 2024-10-23 LAB — CBC WITH DIFFERENTIAL (CANCER CENTER ONLY)
Abs Immature Granulocytes: 0.01 K/uL (ref 0.00–0.07)
Basophils Absolute: 0 K/uL (ref 0.0–0.1)
Basophils Relative: 1 %
Eosinophils Absolute: 0.2 K/uL (ref 0.0–0.5)
Eosinophils Relative: 3 %
HCT: 29.4 % — ABNORMAL LOW (ref 36.0–46.0)
Hemoglobin: 9.5 g/dL — ABNORMAL LOW (ref 12.0–15.0)
Immature Granulocytes: 0 %
Lymphocytes Relative: 32 %
Lymphs Abs: 1.8 K/uL (ref 0.7–4.0)
MCH: 30.4 pg (ref 26.0–34.0)
MCHC: 32.3 g/dL (ref 30.0–36.0)
MCV: 93.9 fL (ref 80.0–100.0)
Monocytes Absolute: 0.3 K/uL (ref 0.1–1.0)
Monocytes Relative: 5 %
Neutro Abs: 3.3 K/uL (ref 1.7–7.7)
Neutrophils Relative %: 59 %
Platelet Count: 168 K/uL (ref 150–400)
RBC: 3.13 MIL/uL — ABNORMAL LOW (ref 3.87–5.11)
RDW: 14.2 % (ref 11.5–15.5)
WBC Count: 5.5 K/uL (ref 4.0–10.5)
nRBC: 0 % (ref 0.0–0.2)

## 2024-10-23 LAB — CMP (CANCER CENTER ONLY)
ALT: 15 U/L (ref 0–44)
AST: 21 U/L (ref 15–41)
Albumin: 4.4 g/dL (ref 3.5–5.0)
Alkaline Phosphatase: 67 U/L (ref 38–126)
Anion gap: 12 (ref 5–15)
BUN: 30 mg/dL — ABNORMAL HIGH (ref 8–23)
CO2: 20 mmol/L — ABNORMAL LOW (ref 22–32)
Calcium: 9.9 mg/dL (ref 8.9–10.3)
Chloride: 110 mmol/L (ref 98–111)
Creatinine: 2.57 mg/dL — ABNORMAL HIGH (ref 0.44–1.00)
GFR, Estimated: 18 mL/min — ABNORMAL LOW
Glucose, Bld: 113 mg/dL — ABNORMAL HIGH (ref 70–99)
Potassium: 4.6 mmol/L (ref 3.5–5.1)
Sodium: 141 mmol/L (ref 135–145)
Total Bilirubin: 0.3 mg/dL (ref 0.0–1.2)
Total Protein: 7.8 g/dL (ref 6.5–8.1)

## 2024-10-23 LAB — IRON AND TIBC
Iron: 55 ug/dL (ref 28–170)
Saturation Ratios: 19 % (ref 10.4–31.8)
TIBC: 291 ug/dL (ref 250–450)
UIBC: 237 ug/dL

## 2024-10-23 LAB — FERRITIN: Ferritin: 314 ng/mL — ABNORMAL HIGH (ref 11–307)

## 2024-10-23 MED ORDER — LIDOCAINE 1 % OPTIME INJ - NO CHARGE
2.0000 mL | Freq: Once | INTRAMUSCULAR | Status: AC
  Administered 2024-10-23: 2 mL via INTRADERMAL
  Filled 2024-10-23: qty 2

## 2024-10-23 MED ORDER — LIDOCAINE-EPINEPHRINE 1 %-1:100000 IJ SOLN
10.0000 mL | Freq: Once | INTRAMUSCULAR | Status: AC
  Administered 2024-10-23: 10 mL via INTRADERMAL
  Filled 2024-10-23: qty 10

## 2024-10-24 ENCOUNTER — Encounter: Payer: Self-pay | Admitting: Oncology

## 2024-10-24 NOTE — Progress Notes (Signed)
 "    Hematology/Oncology Consult note [facility]  [phone] [phone]  Patient Care Team: Jacqueline Greig BRAVO, MD as PCP - General Jacqueline Riggs, MD as PCP - Cardiology (Cardiology) Jacqueline Soyla Lunger, MD as PCP - Electrophysiology (Cardiology) Jacqueline Fallow, MD as Referring Physician (Ophthalmology) Jacqueline Rayfield BIRCH, RN as Oncology Nurse Navigator Jacqueline Annah BROCKS, MD as Consulting Physician (Oncology) [facility], [facility])   Name of the patient: [name]  [id number]  Dec 17, 1940   Date of visit: 10/24/24  Diagnosis-anemia of chronic kidney disease along with component of iron deficiency  Chief complaint/ Reason for visit-routine follow-up of anemia  Heme/Onc history:  patient is a 84 year old female with a past medical history significant for atrial fibrillation, paroxysmal supraventricular tachycardia, hypertension, coronary artery disease referred for anemia.  Her most recent CBC with differential from 11/11/2020 showed a white count of 5.4, H&H of 10.6/32.1 with an MCV of 92 and a platelet count of 183.  Looking back at her prior CBCs patient has always had a normal white count and a platelet count and her hemoglobin has typically remained between 10-11 since 2009.  Patient recently had iron studies, TSH and B12 checked on 11/11/2020 which was within normal limits.     Patient does have chronic kidney disease but has not required any EPO so far    Interval history- [name] is an 84 year old female with stage 3b chronic kidney disease and anemia who presents for evaluation of anemia and a right breast mass.  She was hospitalized one month ago for severe anemia, with a nadir hemoglobin of 7.3 g/dL, requiring a seven-day admission, followed by a second nine-day admission two weeks later. Since discharge, her hemoglobin has improved to 9.5 g/dL. She has not received erythropoiesis-stimulating agents or iron  infusions. She currently reports feeling well and is awaiting iron studies for further evaluation. She denies chest pain, dyspnea, or neurologic deficits.  A right breast mass measuring 1.3 cm was recently identified. She is scheduled for biopsy today after a prior attempt was not completed. Prior imaging did not reveal abnormal lymphadenopathy. She denies breast pain, nipple discharge, or skin changes.       ECOG PS- 2 Pain scale- 0   Review of systems- Review of Systems  Constitutional:  Positive for malaise/fatigue. Negative for chills, fever and weight loss.  HENT:  Negative for congestion, ear discharge and nosebleeds.   Eyes:  Negative for blurred vision.  Respiratory:  Negative for cough, hemoptysis, sputum production, shortness of breath and wheezing.   Cardiovascular:  Negative for chest pain, palpitations, orthopnea and claudication.  Gastrointestinal:  Negative for abdominal pain, blood in stool, constipation, diarrhea, heartburn, melena, nausea and vomiting.  Genitourinary:  Negative for dysuria, flank pain, frequency, hematuria and urgency.  Musculoskeletal:  Negative for back pain, joint pain and myalgias.  Skin:  Negative for rash.  Neurological:  Negative for dizziness, tingling, focal weakness, seizures, weakness and headaches.  Endo/Heme/Allergies:  Does not bruise/bleed easily.  Psychiatric/Behavioral:  Negative for depression and suicidal ideas. The patient does not have insomnia.       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 cath 2/09: no CAD, EF 70%   GERD (gastroesophageal reflux disease) 07/26/2021   HYPERLIPIDEMIA    HYPERTENSION  Hypertensive urgency 07/19/2022   Mild Aortic insufficiency    Moderate mitral regurgitation    Moderate tricuspid regurgitation    OSTEOPENIA    PAF (paroxysmal  atrial fibrillation) (HCC)    a. s/p ablation 2013; b. CHADS2VASc -> 4 (HTN, age x 2, female)-->Eliquis .   PAT (paroxysmal atrial tachycardia)    a. 04/2019 Zio: Occas PACs and rare PVCs. 21 atrial runs - longest 20 beats, max rate 169.   Pneumonia 2009   RA (rheumatoid arthritis) (HCC)    Sinus Bradycardia    a. asymptomatic but prevents use of AVN blocking agents; b. 04/2019 Zio: Avg HR 61 (37-109).   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 05/2019 Echo: EF 60-65%. DD. RVSP 41.64mmHg. Mod dil LA. Mod MR/TR. Mild AI.     Past Surgical History:  Procedure Laterality Date   ABLATION OF DYSRHYTHMIC FOCUS     BREAST BIOPSY Right    RIGHT BREAST US  BX HYDROMARK COIL CLIP-PATH PENDING   BREAST BIOPSY Right 10/23/2024   US  RT BREAST BX W LOC DEV 1ST LESION IMG BX SPEC US  GUIDE 10/23/2024 [facility]   CARDIAC CATHETERIZATION     COLONOSCOPY  04/18/2008   COLONOSCOPY WITH PROPOFOL  N/A 02/28/2023   Procedure: COLONOSCOPY WITH PROPOFOL ;  Surgeon: Unk Jacqueline Skiff, MD;  Location: [facility] ENDOSCOPY;  Service: Gastroenterology;  Laterality: N/A;   ESOPHAGOGASTRODUODENOSCOPY (EGD) WITH PROPOFOL  N/A 02/28/2023   Procedure: ESOPHAGOGASTRODUODENOSCOPY (EGD) WITH PROPOFOL ;  Surgeon: Unk Jacqueline Skiff, MD;  Location: [facility] ENDOSCOPY;  Service: Gastroenterology;  Laterality: N/A;   Partial hysterectomy--1979     Thoracentesis   12/20/07      Social History   Socioeconomic History   Marital status: Married    Spouse name: Not on file   Number of children: Not on file   Years of education: Not on file   Highest education level: Not on file  Occupational History   Not on file  Tobacco Use   Smoking status: Never   Smokeless tobacco: Never  Vaping Use   Vaping status: Never Used  Substance and Sexual Activity   Alcohol use: No    Alcohol/week: 0.0 standard drinks of alcohol   Drug use: No   Sexual activity: Not Currently    Birth control/protection: None  Other Topics  Concern   Not on file  Social History Narrative   Marital Status: widow x 1 yrChildren: 5, grandchildren 63, numerous great grand 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 Full Code ( reviewed 2015) Occasional exercise.Diet: fruits and veggies, lean meats.   Right handed    Caffeine- none    Social Drivers of Health   Tobacco Use: Low Risk (10/23/2024)   Patient History    Smoking Tobacco Use: Never    Smokeless Tobacco Use: Never    Passive Exposure: Not on file  Financial Resource Strain: Low Risk (07/11/2024)   Overall Financial Resource Strain (CARDIA)    Difficulty of Paying Living Expenses: Not hard at all  Food Insecurity: No Food Insecurity (09/02/2024)   Epic    Worried About Radiation Protection Practitioner of Food in the Last Year: Never true    Ran Out of Food in the Last Year: Never true  Transportation Needs: No Transportation Needs (09/02/2024)   Epic    Lack of Transportation (Medical): No  Lack of Transportation (Non-Medical): No  Physical Activity: Inactive (07/11/2024)   Exercise Vital Sign    Days of Exercise per Week: 0 days    Minutes of Exercise per Session: 0 min  Stress: No Stress Concern Present (07/11/2024)   [employer] of Occupational Health - Occupational Stress Questionnaire    Feeling of Stress: Not at all  Social Connections: Socially Integrated (08/25/2024)   Social Connection and Isolation Panel    Frequency of Communication with Friends and Family: More than three times a week    Frequency of Social Gatherings with Friends and Family: More than three times a week    Attends Religious Services: More than 4 times per year    Active Member of [employer] or Organizations: Yes    Attends Tax Inspector Meetings: Never    Marital Status: Married  Catering Manager Violence: Patient Unable To Answer (09/02/2024)   Epic    Fear of Current or Ex-Partner: Patient unable to answer    Emotionally Abused: Patient unable to answer    Physically Abused: Patient unable to answer    Sexually Abused: Patient unable to answer  Depression (PHQ2-9): Low Risk (09/13/2024)   Depression (PHQ2-9)    PHQ-2 Score: 0  Alcohol Screen: Low Risk (07/11/2024)   Alcohol Screen    Last Alcohol Screening Score (AUDIT): 0  Housing: Unknown (09/02/2024)   Epic    Unable to Pay for Housing in the Last Year: No    Number of Times Moved in the Last Year: Not on file    Homeless in the Last Year: No  Utilities: Not At Risk (09/02/2024)   Epic    Threatened with loss of utilities: No  Health Literacy: Inadequate Health Literacy (07/11/2024)   B1300 Health Literacy    Frequency of need for help with medical instructions: Sometimes    Family History  Problem Relation Age of Onset   Diabetes Other    Breast cancer Paternal Aunt 24   Heart disease Mother    Pancreatic cancer Father    Atrial fibrillation Brother    Heart disease Brother    Heart attack Brother     Current Medications[2]  Physical exam:  Vitals:   10/23/24 1351  BP: (!) 138/58  Pulse: (!) 57  Resp: 20  Temp: (!) 97.3 F (36.3 C)  SpO2: 100%  Weight: 138 lb 14.4 oz (63 kg)   Physical Exam Cardiovascular:     Rate and Rhythm: Normal rate and regular rhythm.     Heart sounds: Normal heart sounds.  Pulmonary:     Effort: Pulmonary effort is normal.     Breath sounds: Normal breath sounds.  Skin:    General: Skin is warm and dry.  Neurological:     Mental Status: She is alert and oriented to person, place, and time.      I have personally reviewed labs listed below:    Latest Ref Rng & Units 10/23/2024    1:31 PM  CMP  Glucose 70 - 99 mg/dL 886   BUN 8 - 23 mg/dL 30   Creatinine 9.55 - 1.00 mg/dL 7.42   Sodium 864  - 854 mmol/L 141   Potassium 3.5 - 5.1 mmol/L 4.6   Chloride 98 - 111 mmol/L 110   CO2 22 - 32 mmol/L 20   Calcium  8.9 - 10.3 mg/dL 9.9   Total Protein 6.5 - 8.1 g/dL 7.8   Total Bilirubin 0.0 - 1.2 mg/dL 0.3  Alkaline Phos 38 - 126 U/L 67   AST 15 - 41 U/L 21   ALT 0 - 44 U/L 15       Latest Ref Rng & Units 10/23/2024    1:31 PM  CBC  WBC 4.0 - 10.5 K/uL 5.5   Hemoglobin 12.0 - 15.0 g/dL 9.5   Hematocrit 63.9 - 46.0 % 29.4   Platelets 150 - 400 K/uL 168    I have personally reviewed Radiology images listed below: No images are attached to the encounter.  MM CLIP PLACEMENT RIGHT Result Date: 10/23/2024 CLINICAL DATA:  Status post biopsy of RIGHT breast mass EXAM: 3D DIAGNOSTIC RIGHT MAMMOGRAM POST ULTRASOUND BIOPSY COMPARISON:  Previous exam(s). ACR Breast Density Category b: There are scattered areas of fibroglandular density. FINDINGS: 3D Mammographic images were obtained following ultrasound guided biopsy of the RIGHT breast mass at 3:30 o'clock 10 cm from the nipple. The biopsy marking clip is in expected position at the site of biopsy. Due to its far MEDIAL positioning could only be seen in the CC view. IMPRESSION: Appropriate positioning of the HydroMARK coil shaped biopsy marking clip at the site of biopsy in the RIGHT breast. Final Assessment: Post Procedure Mammograms for Marker Placement Electronically Signed   By: Norleen Croak M.D.   On: 10/23/2024 16:19   US  RT BREAST BX W LOC DEV 1ST LESION IMG BX SPEC US  GUIDE Result Date: 10/23/2024 CLINICAL DATA:  BI-RADS 5 mass in the RIGHT breast EXAM: ULTRASOUND GUIDED RIGHT BREAST CORE NEEDLE BIOPSY COMPARISON:  Previous exam(s). PROCEDURE: I met with the patient and we discussed the procedure of ultrasound-guided biopsy, including benefits and alternatives. We discussed the high likelihood of a successful procedure. We discussed the risks of the procedure, including infection, bleeding, tissue injury, clip migration, and inadequate  sampling. Informed written consent was given. The usual time-out protocol was performed immediately prior to the procedure. Lesion quadrant: Lower inner Using sterile technique and 1% lidocaine  and 1% lidocaine  with epinephrine  as local anesthetic, under direct ultrasound visualization, a 14 gauge spring-loaded device was used to perform biopsy of the RIGHT breast mass at 3:30 o'clock 10 cm from the nipple using a LATERAL approach. At the conclusion of the procedure [facility] coil shaped tissue marker clip was deployed into the biopsy cavity. Follow up 2 view mammogram was performed and dictated separately. IMPRESSION: Ultrasound guided biopsy of the RIGHT breast mass at 3:30 o'clock 10 cm from the nipple. No apparent complications. Electronically Signed   By: Norleen Croak M.D.   On: 10/23/2024 16:13   MM 3D DIAGNOSTIC MAMMOGRAM BILATERAL BREAST Result Date: 10/18/2024 CLINICAL DATA:  RIGHT breast focal pain of uncertain chronicity. LEFT breast focal pain x2 months. Due for annual. Last mammogram in 2018 at which time short-term imaging follow-up is recommended for a low suspicion breast asymmetry. This follow-up was never performed. EXAM: DIGITAL DIAGNOSTIC BILATERAL MAMMOGRAM WITH TOMOSYNTHESIS AND CAD; ULTRASOUND LEFT BREAST LIMITED; ULTRASOUND RIGHT BREAST LIMITED TECHNIQUE: Bilateral digital diagnostic mammography and breast tomosynthesis was performed. The images were evaluated with computer-aided detection. ; Targeted ultrasound examination of the left breast was performed.; Targeted ultrasound examination of the right breast was performed COMPARISON:  Previous exam(s). ACR Breast Density Category b: There are scattered areas of fibroglandular density. FINDINGS: Spot compression tomosynthesis views were obtained over the area of focal pain in the LEFT breast. No suspicious mammographic finding is identified in this area. No suspicious mass, microcalcification, or other finding is identified in the LEFT  breast.  Spot compression tomosynthesis views were obtained over the area of focal pain in the RIGHT breast. No suspicious mammographic finding is identified in this area. No residual asymmetry in the area of concern indicated on remote 2018 mammogram. An approximately 12 mm spiculated mass is noted in the lower-inner quadrant of the RIGHT breast on the CC view only. Due to its far MEDIAL and posterior positioning, it can not be visualized on the MLO. Targeted LEFT breast ultrasound was performed in the area of pain at 2 o'clock 5 cm from the nipple. No suspicious solid or cystic mass is identified. Representative negative pictures were taken. Targeted RIGHT breast ultrasound was performed in the area of pain at 9 o'clock 5 cm from the nipple. No suspicious solid or cystic mass is identified. Representative negative pictures were taken. In the MEDIAL RIGHT breast at the 3:30 o'clock position 10 cm from the nipple there is a palpable, irregular spiculated mass measuring 10 x 7 x 13 mm. This correlates with the mammographic finding. There is surrounding hypervascularity. RIGHT axillary evaluation demonstrates no suspicious lymphadenopathy. IMPRESSION: 1. Highly suspicious 13 mm RIGHT breast mass at the 3:30 o'clock position, for which ultrasound-guided biopsy is recommended. No RIGHT axillary lymphadenopathy is seen. 2.  No mammographic evidence of malignancy in the LEFT breast. 3. No mammographic or sonographic etiology for focal BILATERALbreast painidentified. Breast pain is a common condition, which will often resolve on its own without intervention. It can be affected by hormonal changes, medication side effect, weight changes and fit of the bra. Pain may also be referred from other adjacent areas of the body. Breast pain may be improved by wearing adequate well-fitting support, over-the-counter topical and oral NSAID medication, low-fat diet, and ice/heat as needed. Studies have shown an improvement in cyclic pain  with use of evening primrose oil, vitamin D  and vitamin E. Recommend discussion with your doctor or other provider before starting any over the counter supplements. Clinical follow-up recommended to discuss any further work-up recommendations and appropriate treatment, which should be based on the clinical assessment. Findings and recommendations were discussed with the patient in person. RECOMMENDATION: Ultrasound-guided biopsy of the RIGHT breast x1. Clinical follow-up of the BILATERAL breast symptoms as described above. I have discussed the findings and recommendations with the patient. The recommended procedure was discussed with the patient and questions were answered. Patient expressed their understanding of the recommendation. Patient will be scheduled for the procedure at her earliest convenience by the schedulers. Ordering provider will be notified. If applicable, a reminder letter will be sent to the patient regarding the next appointment. BI-RADS CATEGORY  5: Highly suggestive of malignancy. Electronically Signed   By: Norleen Croak M.D.   On: 10/18/2024 15:40   US  LIMITED ULTRASOUND INCLUDING AXILLA LEFT BREAST  Result Date: 10/18/2024 CLINICAL DATA:  RIGHT breast focal pain of uncertain chronicity. LEFT breast focal pain x2 months. Due for annual. Last mammogram in 2018 at which time short-term imaging follow-up is recommended for a low suspicion breast asymmetry. This follow-up was never performed. EXAM: DIGITAL DIAGNOSTIC BILATERAL MAMMOGRAM WITH TOMOSYNTHESIS AND CAD; ULTRASOUND LEFT BREAST LIMITED; ULTRASOUND RIGHT BREAST LIMITED TECHNIQUE: Bilateral digital diagnostic mammography and breast tomosynthesis was performed. The images were evaluated with computer-aided detection. ; Targeted ultrasound examination of the left breast was performed.; Targeted ultrasound examination of the right breast was performed COMPARISON:  Previous exam(s). ACR Breast Density Category b: There are scattered areas of  fibroglandular density. FINDINGS: Spot compression tomosynthesis views were obtained over the  area of focal pain in the LEFT breast. No suspicious mammographic finding is identified in this area. No suspicious mass, microcalcification, or other finding is identified in the LEFT breast. Spot compression tomosynthesis views were obtained over the area of focal pain in the RIGHT breast. No suspicious mammographic finding is identified in this area. No residual asymmetry in the area of concern indicated on remote 2018 mammogram. An approximately 12 mm spiculated mass is noted in the lower-inner quadrant of the RIGHT breast on the CC view only. Due to its far MEDIAL and posterior positioning, it can not be visualized on the MLO. Targeted LEFT breast ultrasound was performed in the area of pain at 2 o'clock 5 cm from the nipple. No suspicious solid or cystic mass is identified. Representative negative pictures were taken. Targeted RIGHT breast ultrasound was performed in the area of pain at 9 o'clock 5 cm from the nipple. No suspicious solid or cystic mass is identified. Representative negative pictures were taken. In the MEDIAL RIGHT breast at the 3:30 o'clock position 10 cm from the nipple there is a palpable, irregular spiculated mass measuring 10 x 7 x 13 mm. This correlates with the mammographic finding. There is surrounding hypervascularity. RIGHT axillary evaluation demonstrates no suspicious lymphadenopathy. IMPRESSION: 1. Highly suspicious 13 mm RIGHT breast mass at the 3:30 o'clock position, for which ultrasound-guided biopsy is recommended. No RIGHT axillary lymphadenopathy is seen. 2.  No mammographic evidence of malignancy in the LEFT breast. 3. No mammographic or sonographic etiology for focal BILATERALbreast painidentified. Breast pain is a common condition, which will often resolve on its own without intervention. It can be affected by hormonal changes, medication side effect, weight changes and fit of the  bra. Pain may also be referred from other adjacent areas of the body. Breast pain may be improved by wearing adequate well-fitting support, over-the-counter topical and oral NSAID medication, low-fat diet, and ice/heat as needed. Studies have shown an improvement in cyclic pain with use of evening primrose oil, vitamin D  and vitamin E. Recommend discussion with your doctor or other provider before starting any over the counter supplements. Clinical follow-up recommended to discuss any further work-up recommendations and appropriate treatment, which should be based on the clinical assessment. Findings and recommendations were discussed with the patient in person. RECOMMENDATION: Ultrasound-guided biopsy of the RIGHT breast x1. Clinical follow-up of the BILATERAL breast symptoms as described above. I have discussed the findings and recommendations with the patient. The recommended procedure was discussed with the patient and questions were answered. Patient expressed their understanding of the recommendation. Patient will be scheduled for the procedure at her earliest convenience by the schedulers. Ordering provider will be notified. If applicable, a reminder letter will be sent to the patient regarding the next appointment. BI-RADS CATEGORY  5: Highly suggestive of malignancy. Electronically Signed   By: Norleen Croak M.D.   On: 10/18/2024 15:40   US  LIMITED ULTRASOUND INCLUDING AXILLA RIGHT BREAST Result Date: 10/18/2024 CLINICAL DATA:  RIGHT breast focal pain of uncertain chronicity. LEFT breast focal pain x2 months. Due for annual. Last mammogram in 2018 at which time short-term imaging follow-up is recommended for a low suspicion breast asymmetry. This follow-up was never performed. EXAM: DIGITAL DIAGNOSTIC BILATERAL MAMMOGRAM WITH TOMOSYNTHESIS AND CAD; ULTRASOUND LEFT BREAST LIMITED; ULTRASOUND RIGHT BREAST LIMITED TECHNIQUE: Bilateral digital diagnostic mammography and breast tomosynthesis was performed. The  images were evaluated with computer-aided detection. ; Targeted ultrasound examination of the left breast was performed.; Targeted ultrasound examination of the  right breast was performed COMPARISON:  Previous exam(s). ACR Breast Density Category b: There are scattered areas of fibroglandular density. FINDINGS: Spot compression tomosynthesis views were obtained over the area of focal pain in the LEFT breast. No suspicious mammographic finding is identified in this area. No suspicious mass, microcalcification, or other finding is identified in the LEFT breast. Spot compression tomosynthesis views were obtained over the area of focal pain in the RIGHT breast. No suspicious mammographic finding is identified in this area. No residual asymmetry in the area of concern indicated on remote 2018 mammogram. An approximately 12 mm spiculated mass is noted in the lower-inner quadrant of the RIGHT breast on the CC view only. Due to its far MEDIAL and posterior positioning, it can not be visualized on the MLO. Targeted LEFT breast ultrasound was performed in the area of pain at 2 o'clock 5 cm from the nipple. No suspicious solid or cystic mass is identified. Representative negative pictures were taken. Targeted RIGHT breast ultrasound was performed in the area of pain at 9 o'clock 5 cm from the nipple. No suspicious solid or cystic mass is identified. Representative negative pictures were taken. In the MEDIAL RIGHT breast at the 3:30 o'clock position 10 cm from the nipple there is a palpable, irregular spiculated mass measuring 10 x 7 x 13 mm. This correlates with the mammographic finding. There is surrounding hypervascularity. RIGHT axillary evaluation demonstrates no suspicious lymphadenopathy. IMPRESSION: 1. Highly suspicious 13 mm RIGHT breast mass at the 3:30 o'clock position, for which ultrasound-guided biopsy is recommended. No RIGHT axillary lymphadenopathy is seen. 2.  No mammographic evidence of malignancy in the LEFT  breast. 3. No mammographic or sonographic etiology for focal BILATERALbreast painidentified. Breast pain is a common condition, which will often resolve on its own without intervention. It can be affected by hormonal changes, medication side effect, weight changes and fit of the bra. Pain may also be referred from other adjacent areas of the body. Breast pain may be improved by wearing adequate well-fitting support, over-the-counter topical and oral NSAID medication, low-fat diet, and ice/heat as needed. Studies have shown an improvement in cyclic pain with use of evening primrose oil, vitamin D  and vitamin E. Recommend discussion with your doctor or other provider before starting any over the counter supplements. Clinical follow-up recommended to discuss any further work-up recommendations and appropriate treatment, which should be based on the clinical assessment. Findings and recommendations were discussed with the patient in person. RECOMMENDATION: Ultrasound-guided biopsy of the RIGHT breast x1. Clinical follow-up of the BILATERAL breast symptoms as described above. I have discussed the findings and recommendations with the patient. The recommended procedure was discussed with the patient and questions were answered. Patient expressed their understanding of the recommendation. Patient will be scheduled for the procedure at her earliest convenience by the schedulers. Ordering provider will be notified. If applicable, a reminder letter will be sent to the patient regarding the next appointment. BI-RADS CATEGORY  5: Highly suggestive of malignancy. Electronically Signed   By: Norleen Croak M.D.   On: 10/18/2024 15:40   DG Chest 2 View Result Date: 10/06/2024 CLINICAL DATA:  Chest pain. EXAM: CHEST - 2 VIEW COMPARISON:  08/25/2024 FINDINGS: Cardiomegaly stable. Mediastinal contours are unchanged. Aortic atherosclerosis. Subsegmental atelectasis/scar at the left lung base. No confluent opacity. No pulmonary edema  or pleural effusion. No pneumothorax. Mild degenerative change in the spine. IMPRESSION: Stable cardiomegaly. No acute findings. Electronically Signed   By: Andrea Gasman M.D.   On: 10/06/2024  17:00     Assessment and plan- Patient is a 84 y.o. female here for routine follow-up of anemia of chronic kidney disease  Assessment and Plan    Anemia of chronic kidney disease, stage 3b Hemoglobin improved to 9.5 g/dL as compared to a month ago when it was between 7-8 when she was hospitalized.  Overall hemoglobin is improving and I will see if it continues to get better and remains stable around 10 in which case I can hold off on starting EPO.  Iron studies are normal and she does not require any IV iron at this time. Conservative management preferred to avoid cardiovascular risk from hemoglobin >11 g/dL. - Ordered repeat blood work in two and four months to monitor hemoglobin and iron status. - Consider erythropoiesis-stimulating agent therapy in two months if hemoglobin does not improve or declines.  - Reassess at next visit in four months.  Right breast mass, pending biopsy 1.3 cm right breast mass identified on imaging, no abnormal lymphadenopathy. Biopsy scheduled to determine etiology. Initial management surgical excision if malignancy confirmed, further therapy based on pathology and staging. -I will see her sooner if biopsy confirms malignancy    Visit Diagnosis 1. Anemia of chronic renal failure, stage 3b (HCC)      Dr. Annah Skene, MD, MPH [facility] [phone] 10/24/2024 11:57 AM                   [1]  Allergies Allergen Reactions   Amlodipine      Ankle swelling   Celecoxib Other (See Comments) and Rash    Tachycardia/palpitations Other reaction(s): Other Tachycardia/palpitations Tachycardia/palpitations   Tramadol  Nausea Only  [2]  Current Outpatient Medications:    acetaminophen  (TYLENOL ) 325 MG tablet, Take 1 tablet (325  mg total) by mouth every 6 (six) hours as needed for mild pain (pain score 1-3)., Disp: 90 tablet, Rfl: 0   amiodarone  (PACERONE ) 200 MG tablet, TAKE 1/2 TABLET (100 MG TOTAL) BY MOUTH ON MONDAY, TUESDAY, THURSDAY, FRIDAY ANDSATURDAY, Disp: 8 tablet, Rfl: 0   doxazosin  (CARDURA ) 2 MG tablet, TAKE 1 TABLET BY MOUTH DAILY, Disp: 90 tablet, Rfl: 3   ELIQUIS  2.5 MG TABS tablet, TAKE 1 TABLET BY MOUTH TWICE A DAY, Disp: 180 tablet, Rfl: 3   furosemide  (LASIX ) 20 MG tablet, Take 0.5-1 tablets (10-20 mg total) by mouth daily as needed., Disp: 15 tablet, Rfl: 0   gabapentin  (NEURONTIN ) 100 MG capsule, Take 1 capsule (100 mg total) by mouth 2 (two) times daily., Disp: 180 capsule, Rfl: 1   hydrALAZINE  (APRESOLINE ) 50 MG tablet, TAKE 1 TABLET BY MOUTH EVERY 8 HOURS., Disp: 270 tablet, Rfl: 3   lisinopril  (ZESTRIL ) 20 MG tablet, Take 1 tablet (20 mg total) by mouth daily., Disp: 90 tablet, Rfl: 1   LUMIGAN 0.01 % SOLN, Apply 1 drop to eye at bedtime., Disp: , Rfl:    Melatonin 10 MG CAPS, Take 10 mg by mouth at bedtime., Disp: 30 capsule, Rfl: 0   methocarbamol  (ROBAXIN ) 500 MG tablet, Take 1 tablet (500 mg total) by mouth 3 (three) times daily as needed for muscle spasms. Take 500 mg by mouth 3 (three) times daily., Disp: 30 tablet, Rfl: 0   ondansetron  (ZOFRAN ) 4 MG tablet, Take 1-2 tablets (4-8 mg total) by mouth every 8 (eight) hours as needed for nausea or vomiting., Disp: 30 tablet, Rfl: 0   pantoprazole  (PROTONIX ) 40 MG tablet, TAKE 1 TABLET BY MOUTH DAILY, Disp: 30 tablet, Rfl:  11   rosuvastatin  (CRESTOR ) 10 MG tablet, TAKE 1 TABLET BY MOUTH DAILY, Disp: 90 tablet, Rfl: 0   senna-docusate (SENOKOT-S) 8.6-50 MG tablet, Take 1 tablet by mouth at bedtime as needed for mild constipation., Disp: 20 tablet, Rfl: 0   SIMBRINZA 1-0.2 % SUSP, Place 1 drop into both eyes daily., Disp: , Rfl:    sucralfate  (CARAFATE ) 1 g tablet, TAKE 1 TABLET BY MOUTH 4 TIMES DAILY WITH MEALS AND AT BEDTIME, Disp: 120 tablet, Rfl:  0  "

## 2024-10-25 LAB — SURGICAL PATHOLOGY

## 2024-10-28 ENCOUNTER — Encounter: Payer: Self-pay | Admitting: *Deleted

## 2024-10-28 DIAGNOSIS — C50919 Malignant neoplasm of unspecified site of unspecified female breast: Secondary | ICD-10-CM

## 2024-10-28 NOTE — Progress Notes (Signed)
 Received referral for newly diagnosed breast cancer from [facility].  Navigation initiated.  She will see Dr. Melanee on Monday 1/26.   Referral placed to [facility], they will call her with an appointment.

## 2024-10-30 ENCOUNTER — Ambulatory Visit: Payer: Self-pay | Admitting: Family Medicine

## 2024-11-04 ENCOUNTER — Inpatient Hospital Stay: Admitting: Oncology

## 2024-11-05 ENCOUNTER — Encounter: Payer: Self-pay | Admitting: Oncology

## 2024-11-05 ENCOUNTER — Encounter: Payer: Self-pay | Admitting: *Deleted

## 2024-11-05 ENCOUNTER — Inpatient Hospital Stay: Admitting: Oncology

## 2024-11-05 ENCOUNTER — Inpatient Hospital Stay

## 2024-11-05 VITALS — BP 123/66 | HR 52 | Temp 97.5°F | Resp 18 | Ht 62.5 in | Wt 134.6 lb

## 2024-11-05 DIAGNOSIS — N1832 Chronic kidney disease, stage 3b: Secondary | ICD-10-CM | POA: Diagnosis not present

## 2024-11-05 DIAGNOSIS — C50311 Malignant neoplasm of lower-inner quadrant of right female breast: Secondary | ICD-10-CM | POA: Diagnosis not present

## 2024-11-05 DIAGNOSIS — Z170 Estrogen receptor positive status [ER+]: Secondary | ICD-10-CM

## 2024-11-05 DIAGNOSIS — C50919 Malignant neoplasm of unspecified site of unspecified female breast: Secondary | ICD-10-CM

## 2024-11-05 LAB — GENETIC SCREENING ORDER

## 2024-11-05 NOTE — Progress Notes (Signed)
 Patient not feeling well; having some left hip pain. New diagnosis of Breast Cancer; patient not aware of diagnosis.

## 2024-11-05 NOTE — Progress Notes (Signed)
 "    Hematology/Oncology Consult note [facility]  Telephone:([phone] [phone]  Patient Care Team: Avelina Greig BRAVO, MD as PCP - General Raford Riggs, MD as PCP - Cardiology (Cardiology) Inocencio Soyla Lunger, MD as PCP - Electrophysiology (Cardiology) Jaye Fallow, MD as Referring Physician (Ophthalmology) Melanee Annah BROCKS, MD as Consulting Physician (Oncology) Pa, [facility] (Optometry) Georgina Shasta POUR, RN as Oncology Nurse Navigator   Name of the patient: [name]  [id number]  1941/09/21   Date of visit: 11/05/24  Diagnosis- 1.  Anemia of chronic kidney disease 2.  New diagnosis of right breast cancer  Chief complaint/ Reason for visit-discuss further treatment options for breast cancer  Heme/Onc history: patient is a 84 year old female with a past medical history significant for atrial fibrillation, paroxysmal supraventricular tachycardia, hypertension, coronary artery disease referred for anemia.  Her most recent CBC with differential from 11/11/2020 showed a white count of 5.4, H&H of 10.6/32.1 with an MCV of 92 and a platelet count of 183.  Looking back at her prior CBCs patient has always had a normal white count and a platelet count and her hemoglobin has typically remained between 10-11 since 2009.  Patient recently had iron studies, TSH and B12 checked on 11/11/2020 which was within normal limits.     Patient does have chronic kidney disease but has not required any EPO so far    [name] is an 84 year old female with newly diagnosed stage I right breast cancer who presents for oncology consultation regarding initial management and treatment planning.  She developed right breast pain, which prompted a mammogram revealing a 1.3 cm lesion. Biopsy confirmed invasive breast carcinoma. Imaging demonstrated no abnormal lymph nodes. Pathology showed the tumor is estrogen receptor positive (90%), progesterone receptor  positive (10%), and HER2 equivocal by IHC but positive by FISH.  cT1c N0  She is anticoagulated with apixaban  for atrial fibrillation. Her most recent echocardiogram in 2023 demonstrated normal cardiac function.       Interval history-no acute issues since last visit  ECOG PS- 2 Pain scale- 0   Review of systems- Review of Systems  Constitutional:  Negative for chills, fever, malaise/fatigue and weight loss.  HENT:  Negative for congestion, ear discharge and nosebleeds.   Eyes:  Negative for blurred vision.  Respiratory:  Negative for cough, hemoptysis, sputum production, shortness of breath and wheezing.   Cardiovascular:  Negative for chest pain, palpitations, orthopnea and claudication.  Gastrointestinal:  Negative for abdominal pain, blood in stool, constipation, diarrhea, heartburn, melena, nausea and vomiting.  Genitourinary:  Negative for dysuria, flank pain, frequency, hematuria and urgency.  Musculoskeletal:  Negative for back pain, joint pain and myalgias.  Skin:  Negative for rash.  Neurological:  Negative for dizziness, tingling, focal weakness, seizures, weakness and headaches.  Endo/Heme/Allergies:  Does not bruise/bleed easily.  Psychiatric/Behavioral:  Negative for depression and suicidal ideas. The patient does not have insomnia.       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 cath 2/09: no CAD, EF 70%   GERD (gastroesophageal reflux disease) 07/26/2021   HYPERLIPIDEMIA    HYPERTENSION    Hypertensive urgency 07/19/2022   Mild Aortic insufficiency    Moderate mitral regurgitation    Moderate tricuspid regurgitation    OSTEOPENIA  PAF (paroxysmal atrial fibrillation) (HCC)    a. s/p ablation 2013; b. CHADS2VASc -> 4 (HTN, age x 2, female)-->Eliquis .   PAT (paroxysmal atrial tachycardia)    a.  04/2019 Zio: Occas PACs and rare PVCs. 21 atrial runs - longest 20 beats, max rate 169.   Pneumonia 2009   RA (rheumatoid arthritis) (HCC)    Sinus Bradycardia    a. asymptomatic but prevents use of AVN blocking agents; b. 04/2019 Zio: Avg HR 61 (37-109).   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 05/2019 Echo: EF 60-65%. DD. RVSP 41.53mmHg. Mod dil LA. Mod MR/TR. Mild AI.     Past Surgical History:  Procedure Laterality Date   ABLATION OF DYSRHYTHMIC FOCUS     BREAST BIOPSY Right    RIGHT BREAST US  BX HYDROMARK COIL CLIP-PATH PENDING   BREAST BIOPSY Right 10/23/2024   US  RT BREAST BX W LOC DEV 1ST LESION IMG BX SPEC US  GUIDE 10/23/2024 [facility]-MAMMOGRAPHY   CARDIAC CATHETERIZATION     COLONOSCOPY  04/18/2008   COLONOSCOPY WITH PROPOFOL  N/A 02/28/2023   Procedure: COLONOSCOPY WITH PROPOFOL ;  Surgeon: Unk Corinn Skiff, MD;  Location: [facility] ENDOSCOPY;  Service: Gastroenterology;  Laterality: N/A;   ESOPHAGOGASTRODUODENOSCOPY (EGD) WITH PROPOFOL  N/A 02/28/2023   Procedure: ESOPHAGOGASTRODUODENOSCOPY (EGD) WITH PROPOFOL ;  Surgeon: Unk Corinn Skiff, MD;  Location: [facility] ENDOSCOPY;  Service: Gastroenterology;  Laterality: N/A;   Partial hysterectomy--1979     Thoracentesis   12/20/07      Social History   Socioeconomic History   Marital status: Married    Spouse name: Not on file   Number of children: Not on file   Years of education: Not on file   Highest education level: Not on file  Occupational History   Not on file  Tobacco Use   Smoking status: Never   Smokeless tobacco: Never  Vaping Use   Vaping status: Never Used  Substance and Sexual Activity   Alcohol use: No    Alcohol/week: 0.0 standard drinks of alcohol   Drug use: No   Sexual activity: Not Currently    Birth control/protection: None  Other Topics Concern   Not on file  Social History Narrative   Marital Status: widow x 1 yrChildren: 5, grandchildren 65, numerous great grand childrenOccupation:  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 Full Code ( reviewed 2015) Occasional exercise.Diet: fruits and veggies, lean meats.   Right handed    Caffeine- none    Social Drivers of Health   Tobacco Use: Low Risk (11/05/2024)   Patient History    Smoking Tobacco Use: Never    Smokeless Tobacco Use: Never    Passive Exposure: Not on file  Financial Resource Strain: Low Risk (07/11/2024)   Overall Financial Resource Strain (CARDIA)    Difficulty of Paying Living Expenses: Not hard at all  Food Insecurity: No Food Insecurity (09/02/2024)   Epic    Worried About Programme Researcher, Broadcasting/film/video in the Last Year: Never true    Ran Out of Food in the Last Year: Never true  Transportation Needs: No Transportation Needs (09/02/2024)   Epic    Lack of Transportation (Medical): No    Lack of Transportation (Non-Medical): No  Physical Activity: Inactive (07/11/2024)   Exercise Vital Sign    Days of Exercise per Week: 0 days  Minutes of Exercise per Session: 0 min  Stress: No Stress Concern Present (07/11/2024)   [employer] of Occupational Health - Occupational Stress Questionnaire    Feeling of Stress: Not at all  Social Connections: Socially Integrated (08/25/2024)   Social Connection and Isolation Panel    Frequency of Communication with Friends and Family: More than three times a week    Frequency of Social Gatherings with Friends and Family: More than three times a week    Attends Religious Services: More than 4 times per year    Active Member of [employer] or Organizations: Yes    Attends Banker Meetings: Never    Marital Status: Married  Catering Manager Violence: Patient Unable To Answer (09/02/2024)   Epic    Fear of Current or  Ex-Partner: Patient unable to answer    Emotionally Abused: Patient unable to answer    Physically Abused: Patient unable to answer    Sexually Abused: Patient unable to answer  Depression (PHQ2-9): Low Risk (11/05/2024)   Depression (PHQ2-9)    PHQ-2 Score: 0  Alcohol Screen: Low Risk (07/11/2024)   Alcohol Screen    Last Alcohol Screening Score (AUDIT): 0  Housing: Unknown (09/02/2024)   Epic    Unable to Pay for Housing in the Last Year: No    Number of Times Moved in the Last Year: Not on file    Homeless in the Last Year: No  Utilities: Not At Risk (09/02/2024)   Epic    Threatened with loss of utilities: No  Health Literacy: Inadequate Health Literacy (07/11/2024)   B1300 Health Literacy    Frequency of need for help with medical instructions: Sometimes    Family History  Problem Relation Age of Onset   Diabetes Other    Breast cancer Paternal Aunt 63   Heart disease Mother    Pancreatic cancer Father    Atrial fibrillation Brother    Heart disease Brother    Heart attack Brother     Current Medications[2]  Physical exam:  Vitals:   11/05/24 1536  BP: 123/66  Pulse: (!) 52  Resp: 18  Temp: (!) 97.5 F (36.4 C)  TempSrc: Tympanic  SpO2: 100%  Weight: 134 lb 9.6 oz (61.1 kg)  Height: 5' 2.5 (1.588 m)   Physical Exam Eyes:     Pupils: Pupils are equal, round, and reactive to light.  Cardiovascular:     Rate and Rhythm: Normal rate and regular rhythm.     Heart sounds: Normal heart sounds.  Pulmonary:     Effort: Pulmonary effort is normal.     Breath sounds: Normal breath sounds.  Abdominal:     General: Bowel sounds are normal.     Palpations: Abdomen is soft.  Skin:    General: Skin is warm and dry.  Neurological:     Mental Status: She is alert and oriented to person, place, and time.   Breast exam: No palpable masses in either breast.  No palpable bilateral axillary adenopathy  I have personally reviewed labs listed below:    Latest Ref Rng &  Units 10/23/2024    1:31 PM  CMP  Glucose 70 - 99 mg/dL 886   BUN 8 - 23 mg/dL 30   Creatinine 9.55 - 1.00 mg/dL 7.42   Sodium 864 - 854 mmol/L 141   Potassium 3.5 - 5.1 mmol/L 4.6   Chloride 98 - 111 mmol/L 110   CO2 22 - 32 mmol/L 20  Calcium  8.9 - 10.3 mg/dL 9.9   Total Protein 6.5 - 8.1 g/dL 7.8   Total Bilirubin 0.0 - 1.2 mg/dL 0.3   Alkaline Phos 38 - 126 U/L 67   AST 15 - 41 U/L 21   ALT 0 - 44 U/L 15       Latest Ref Rng & Units 10/23/2024    1:31 PM  CBC  WBC 4.0 - 10.5 K/uL 5.5   Hemoglobin 12.0 - 15.0 g/dL 9.5   Hematocrit 63.9 - 46.0 % 29.4   Platelets 150 - 400 K/uL 168    I have personally reviewed Radiology images listed below: No images are attached to the encounter.  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 Pathology results are CONCORDANT with imaging findings, per Dr. Norleen Croak. Pathology results and recommendations below were discussed with patient's daughter Ileene by telephone who reported biopsy site doing well with no adverse symptoms, and only slight tenderness at the site. Post biopsy care instructions were reviewed, questions were answered and my direct phone number was provided. She was instructed to call [facility] for any additional questions or concerns related to biopsy site. RECOMMENDATIONS: 1. Surgical and oncological consultation. Request for surgical and oncological consultation was relayed to Shasta Ada, Nurse Navigator at [facility]. Pathology results reported by Mliss CHARM Molt RN 10/25/2024. Electronically Signed   By: Norleen Croak M.D.   On: 10/30/2024 07:34   Result Date: 10/30/2024 CLINICAL DATA:  BI-RADS 5 mass in the RIGHT breast EXAM: ULTRASOUND GUIDED RIGHT BREAST CORE NEEDLE BIOPSY COMPARISON:  Previous exam(s). PROCEDURE: I met with the patient and we discussed the procedure of ultrasound-guided biopsy, including benefits and alternatives. We discussed the high likelihood of a successful procedure. We discussed the risks of the procedure, including infection, bleeding, tissue injury, clip migration, and inadequate sampling. Informed written consent was given. The usual time-out protocol was performed immediately prior to the procedure. Lesion quadrant: Lower inner Using sterile technique and 1% lidocaine  and 1% lidocaine  with epinephrine  as local anesthetic, under direct ultrasound visualization, a 14 gauge spring-loaded device was used to perform biopsy of the RIGHT breast mass at 3:30 o'clock 10 cm from the nipple using a LATERAL approach. At the conclusion of the procedure [facility] coil shaped tissue marker clip was deployed into the biopsy cavity. Follow up 2 view mammogram was performed and dictated separately. IMPRESSION: Ultrasound guided biopsy of the RIGHT breast mass at 3:30 o'clock 10 cm from the nipple. No apparent complications. Electronically Signed: By: Norleen Croak M.D. On: 10/23/2024 16:13   MM CLIP PLACEMENT RIGHT Result Date: 10/23/2024 CLINICAL DATA:  Status post biopsy of RIGHT breast mass EXAM: 3D DIAGNOSTIC RIGHT MAMMOGRAM POST ULTRASOUND BIOPSY COMPARISON:  Previous exam(s). ACR Breast Density Category b: There are scattered areas of fibroglandular density. FINDINGS: 3D Mammographic images were obtained following ultrasound guided biopsy of the RIGHT breast mass at 3:30 o'clock 10 cm from the nipple. The biopsy marking clip is in expected position at the site of biopsy. Due to its far MEDIAL positioning could only be seen in the CC view. IMPRESSION: Appropriate positioning of the HydroMARK coil  shaped  biopsy marking clip at the site of biopsy in the RIGHT breast. Final Assessment: Post Procedure Mammograms for Marker Placement Electronically Signed   By: Norleen Croak M.D.   On: 10/23/2024 16:19   MM 3D DIAGNOSTIC MAMMOGRAM BILATERAL BREAST Result Date: 10/18/2024 CLINICAL DATA:  RIGHT breast focal pain of uncertain chronicity. LEFT breast focal pain x2 months. Due for annual. Last mammogram in 2018 at which time short-term imaging follow-up is recommended for a low suspicion breast asymmetry. This follow-up was never performed. EXAM: DIGITAL DIAGNOSTIC BILATERAL MAMMOGRAM WITH TOMOSYNTHESIS AND CAD; ULTRASOUND LEFT BREAST LIMITED; ULTRASOUND RIGHT BREAST LIMITED TECHNIQUE: Bilateral digital diagnostic mammography and breast tomosynthesis was performed. The images were evaluated with computer-aided detection. ; Targeted ultrasound examination of the left breast was performed.; Targeted ultrasound examination of the right breast was performed COMPARISON:  Previous exam(s). ACR Breast Density Category b: There are scattered areas of fibroglandular density. FINDINGS: Spot compression tomosynthesis views were obtained over the area of focal pain in the LEFT breast. No suspicious mammographic finding is identified in this area. No suspicious mass, microcalcification, or other finding is identified in the LEFT breast. Spot compression tomosynthesis views were obtained over the area of focal pain in the RIGHT breast. No suspicious mammographic finding is identified in this area. No residual asymmetry in the area of concern indicated on remote 2018 mammogram. An approximately 12 mm spiculated mass is noted in the lower-inner quadrant of the RIGHT breast on the CC view only. Due to its far MEDIAL and posterior positioning, it can not be visualized on the MLO. Targeted LEFT breast ultrasound was performed in the area of pain at 2 o'clock 5 cm from the nipple. No suspicious solid or cystic mass is identified.  Representative negative pictures were taken. Targeted RIGHT breast ultrasound was performed in the area of pain at 9 o'clock 5 cm from the nipple. No suspicious solid or cystic mass is identified. Representative negative pictures were taken. In the MEDIAL RIGHT breast at the 3:30 o'clock position 10 cm from the nipple there is a palpable, irregular spiculated mass measuring 10 x 7 x 13 mm. This correlates with the mammographic finding. There is surrounding hypervascularity. RIGHT axillary evaluation demonstrates no suspicious lymphadenopathy. IMPRESSION: 1. Highly suspicious 13 mm RIGHT breast mass at the 3:30 o'clock position, for which ultrasound-guided biopsy is recommended. No RIGHT axillary lymphadenopathy is seen. 2.  No mammographic evidence of malignancy in the LEFT breast. 3. No mammographic or sonographic etiology for focal BILATERALbreast painidentified. Breast pain is a common condition, which will often resolve on its own without intervention. It can be affected by hormonal changes, medication side effect, weight changes and fit of the bra. Pain may also be referred from other adjacent areas of the body. Breast pain may be improved by wearing adequate well-fitting support, over-the-counter topical and oral NSAID medication, low-fat diet, and ice/heat as needed. Studies have shown an improvement in cyclic pain with use of evening primrose oil, vitamin D  and vitamin E. Recommend discussion with your doctor or other provider before starting any over the counter supplements. Clinical follow-up recommended to discuss any further work-up recommendations and appropriate treatment, which should be based on the clinical assessment. Findings and recommendations were discussed with the patient in person. RECOMMENDATION: Ultrasound-guided biopsy of the RIGHT breast x1. Clinical follow-up of the BILATERAL breast symptoms as described above. I have discussed the findings and recommendations with the patient. The  recommended procedure was discussed with the patient and questions were answered. Patient  expressed their understanding of the recommendation. Patient will be scheduled for the procedure at her earliest convenience by the schedulers. Ordering provider will be notified. If applicable, a reminder letter will be sent to the patient regarding the next appointment. BI-RADS CATEGORY  5: Highly suggestive of malignancy. Electronically Signed   By: Norleen Croak M.D.   On: 10/18/2024 15:40   US  LIMITED ULTRASOUND INCLUDING AXILLA LEFT BREAST  Result Date: 10/18/2024 CLINICAL DATA:  RIGHT breast focal pain of uncertain chronicity. LEFT breast focal pain x2 months. Due for annual. Last mammogram in 2018 at which time short-term imaging follow-up is recommended for a low suspicion breast asymmetry. This follow-up was never performed. EXAM: DIGITAL DIAGNOSTIC BILATERAL MAMMOGRAM WITH TOMOSYNTHESIS AND CAD; ULTRASOUND LEFT BREAST LIMITED; ULTRASOUND RIGHT BREAST LIMITED TECHNIQUE: Bilateral digital diagnostic mammography and breast tomosynthesis was performed. The images were evaluated with computer-aided detection. ; Targeted ultrasound examination of the left breast was performed.; Targeted ultrasound examination of the right breast was performed COMPARISON:  Previous exam(s). ACR Breast Density Category b: There are scattered areas of fibroglandular density. FINDINGS: Spot compression tomosynthesis views were obtained over the area of focal pain in the LEFT breast. No suspicious mammographic finding is identified in this area. No suspicious mass, microcalcification, or other finding is identified in the LEFT breast. Spot compression tomosynthesis views were obtained over the area of focal pain in the RIGHT breast. No suspicious mammographic finding is identified in this area. No residual asymmetry in the area of concern indicated on remote 2018 mammogram. An approximately 12 mm spiculated mass is noted in the lower-inner  quadrant of the RIGHT breast on the CC view only. Due to its far MEDIAL and posterior positioning, it can not be visualized on the MLO. Targeted LEFT breast ultrasound was performed in the area of pain at 2 o'clock 5 cm from the nipple. No suspicious solid or cystic mass is identified. Representative negative pictures were taken. Targeted RIGHT breast ultrasound was performed in the area of pain at 9 o'clock 5 cm from the nipple. No suspicious solid or cystic mass is identified. Representative negative pictures were taken. In the MEDIAL RIGHT breast at the 3:30 o'clock position 10 cm from the nipple there is a palpable, irregular spiculated mass measuring 10 x 7 x 13 mm. This correlates with the mammographic finding. There is surrounding hypervascularity. RIGHT axillary evaluation demonstrates no suspicious lymphadenopathy. IMPRESSION: 1. Highly suspicious 13 mm RIGHT breast mass at the 3:30 o'clock position, for which ultrasound-guided biopsy is recommended. No RIGHT axillary lymphadenopathy is seen. 2.  No mammographic evidence of malignancy in the LEFT breast. 3. No mammographic or sonographic etiology for focal BILATERALbreast painidentified. Breast pain is a common condition, which will often resolve on its own without intervention. It can be affected by hormonal changes, medication side effect, weight changes and fit of the bra. Pain may also be referred from other adjacent areas of the body. Breast pain may be improved by wearing adequate well-fitting support, over-the-counter topical and oral NSAID medication, low-fat diet, and ice/heat as needed. Studies have shown an improvement in cyclic pain with use of evening primrose oil, vitamin D  and vitamin E. Recommend discussion with your doctor or other provider before starting any over the counter supplements. Clinical follow-up recommended to discuss any further work-up recommendations and appropriate treatment, which should be based on the clinical  assessment. Findings and recommendations were discussed with the patient in person. RECOMMENDATION: Ultrasound-guided biopsy of the RIGHT breast x1. Clinical follow-up of  the BILATERAL breast symptoms as described above. I have discussed the findings and recommendations with the patient. The recommended procedure was discussed with the patient and questions were answered. Patient expressed their understanding of the recommendation. Patient will be scheduled for the procedure at her earliest convenience by the schedulers. Ordering provider will be notified. If applicable, a reminder letter will be sent to the patient regarding the next appointment. BI-RADS CATEGORY  5: Highly suggestive of malignancy. Electronically Signed   By: Norleen Croak M.D.   On: 10/18/2024 15:40   US  LIMITED ULTRASOUND INCLUDING AXILLA RIGHT BREAST Result Date: 10/18/2024 CLINICAL DATA:  RIGHT breast focal pain of uncertain chronicity. LEFT breast focal pain x2 months. Due for annual. Last mammogram in 2018 at which time short-term imaging follow-up is recommended for a low suspicion breast asymmetry. This follow-up was never performed. EXAM: DIGITAL DIAGNOSTIC BILATERAL MAMMOGRAM WITH TOMOSYNTHESIS AND CAD; ULTRASOUND LEFT BREAST LIMITED; ULTRASOUND RIGHT BREAST LIMITED TECHNIQUE: Bilateral digital diagnostic mammography and breast tomosynthesis was performed. The images were evaluated with computer-aided detection. ; Targeted ultrasound examination of the left breast was performed.; Targeted ultrasound examination of the right breast was performed COMPARISON:  Previous exam(s). ACR Breast Density Category b: There are scattered areas of fibroglandular density. FINDINGS: Spot compression tomosynthesis views were obtained over the area of focal pain in the LEFT breast. No suspicious mammographic finding is identified in this area. No suspicious mass, microcalcification, or other finding is identified in the LEFT breast. Spot compression  tomosynthesis views were obtained over the area of focal pain in the RIGHT breast. No suspicious mammographic finding is identified in this area. No residual asymmetry in the area of concern indicated on remote 2018 mammogram. An approximately 12 mm spiculated mass is noted in the lower-inner quadrant of the RIGHT breast on the CC view only. Due to its far MEDIAL and posterior positioning, it can not be visualized on the MLO. Targeted LEFT breast ultrasound was performed in the area of pain at 2 o'clock 5 cm from the nipple. No suspicious solid or cystic mass is identified. Representative negative pictures were taken. Targeted RIGHT breast ultrasound was performed in the area of pain at 9 o'clock 5 cm from the nipple. No suspicious solid or cystic mass is identified. Representative negative pictures were taken. In the MEDIAL RIGHT breast at the 3:30 o'clock position 10 cm from the nipple there is a palpable, irregular spiculated mass measuring 10 x 7 x 13 mm. This correlates with the mammographic finding. There is surrounding hypervascularity. RIGHT axillary evaluation demonstrates no suspicious lymphadenopathy. IMPRESSION: 1. Highly suspicious 13 mm RIGHT breast mass at the 3:30 o'clock position, for which ultrasound-guided biopsy is recommended. No RIGHT axillary lymphadenopathy is seen. 2.  No mammographic evidence of malignancy in the LEFT breast. 3. No mammographic or sonographic etiology for focal BILATERALbreast painidentified. Breast pain is a common condition, which will often resolve on its own without intervention. It can be affected by hormonal changes, medication side effect, weight changes and fit of the bra. Pain may also be referred from other adjacent areas of the body. Breast pain may be improved by wearing adequate well-fitting support, over-the-counter topical and oral NSAID medication, low-fat diet, and ice/heat as needed. Studies have shown an improvement in cyclic pain with use of evening  primrose oil, vitamin D  and vitamin E. Recommend discussion with your doctor or other provider before starting any over the counter supplements. Clinical follow-up recommended to discuss any further work-up recommendations and appropriate treatment,  which should be based on the clinical assessment. Findings and recommendations were discussed with the patient in person. RECOMMENDATION: Ultrasound-guided biopsy of the RIGHT breast x1. Clinical follow-up of the BILATERAL breast symptoms as described above. I have discussed the findings and recommendations with the patient. The recommended procedure was discussed with the patient and questions were answered. Patient expressed their understanding of the recommendation. Patient will be scheduled for the procedure at her earliest convenience by the schedulers. Ordering provider will be notified. If applicable, a reminder letter will be sent to the patient regarding the next appointment. BI-RADS CATEGORY  5: Highly suggestive of malignancy. Electronically Signed   By: Norleen Croak M.D.   On: 10/18/2024 15:40     Assessment and plan- Patient is a 84 y.o. 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 HER2 positivity increases recurrence risk from approximately 5% to 30% based on retrospective data.  Adjuvant therapy reduces it to <2%.  HER2 positive breast cancer has a 3.7 fold increased risk of recurrence as compared to hormone positive HER2 negative tumors.    - In a phase II study that enrolled 410 patients with node-negative disease up to 3 cm (31 percent with T1b lesions and 17 percent with T1a lesions), treatment with  paclitaxel (80 mg/m2) weekly for 12 weeks plus trastuzumab for one year was associated with the following [[id number]]:  ?The three-year rate of survival free from invasive disease was 98.7 percent (95% CI 97.6-99.8). The three-year rate of recurrence-free survival was 99.2 percent (95% CI 98.4-100.0). There was no difference seen when patients were stratified by tumor size (<=1 versus >1 cm).  ?Outcomes at longer-term follow-up remained satisfactory, with seven-year and 10-year DFS of 93 and 91 percent, respectively, and OS of 95 and 94 percent, respectively.  - Patient will be seeing Dr. Jordis for discussion regarding lumpectomy and sentinel lymph node sampling. - Lumpectomy recommended; mastectomy not indicated due to tumor size and radiation therapy willingness. - Planned port placement at lumpectomy for chemotherapy and Herceptin. - Post-operative radiation therapy to right breast for 4-5 weeks upon completion of 12 weekly cycles of Taxol - Adjuvant chemotherapy with weekly paclitaxel for 12 weeks; discussed neuropathy and alopecia risks, use of frozen gloves/socks. - Adjuvant HER2-directed therapy with trastuzumab every 3 weeks for 1 year; discussed cardiac dysfunction, rash, diarrhea risks. - Monitor cardiac function with echocardiogram before Herceptin and every 3 months; hold Herceptin if cardiac function declines. - Initiate hormone therapy after radiation therapy. - Follow-up in 3-4 weeks post-surgery to discuss adjuvant therapy initiation with echocardiogram prior.  Patient and her family understand the plan and they are agreeable to proceeding with adjuvant chemotherapy with Taxol and Herceptin - Patient is also agreeable to getting genetic testing done  Atrial fibrillation Atrial fibrillation managed with apixaban . Discussed perioperative management and cardiac risks related to trastuzumab therapy. Coordination with cardiologist planned. - Hold apixaban  for 2 days prior to surgery, resume  post-operatively. - Contact cardiologist to discuss perioperative anticoagulation and confirm trastuzumab safety.         Visit Diagnosis 1. Malignant neoplasm of lower-inner quadrant of right breast of female, estrogen receptor positive (HCC)  Dr. Annah Skene, MD, MPH [facility] at [facility] [phone] 11/05/2024 4:18 PM                   [1]  Allergies Allergen Reactions   Amlodipine      Ankle swelling   Celecoxib Other (See Comments) and Rash    Tachycardia/palpitations Other reaction(s): Other Tachycardia/palpitations Tachycardia/palpitations   Tramadol  Nausea Only  [2]  Current Outpatient Medications:    acetaminophen  (TYLENOL ) 325 MG tablet, Take 1 tablet (325 mg total) by mouth every 6 (six) hours as needed for mild pain (pain score 1-3)., Disp: 90 tablet, Rfl: 0   amiodarone  (PACERONE ) 200 MG tablet, TAKE 1/2 TABLET (100 MG TOTAL) BY MOUTH ON MONDAY, TUESDAY, THURSDAY, FRIDAY ANDSATURDAY, Disp: 8 tablet, Rfl: 0   doxazosin  (CARDURA ) 2 MG tablet, TAKE 1 TABLET BY MOUTH DAILY, Disp: 90 tablet, Rfl: 3   ELIQUIS  2.5 MG TABS tablet, TAKE 1 TABLET BY MOUTH TWICE A DAY, Disp: 180 tablet, Rfl: 3   furosemide  (LASIX ) 20 MG tablet, Take 0.5-1 tablets (10-20 mg total) by mouth daily as needed., Disp: 15 tablet, Rfl: 0   gabapentin  (NEURONTIN ) 100 MG capsule, Take 1 capsule (100 mg total) by mouth 2 (two) times daily., Disp: 180 capsule, Rfl: 1   hydrALAZINE  (APRESOLINE ) 50 MG tablet, TAKE 1 TABLET BY MOUTH EVERY 8 HOURS., Disp: 270 tablet, Rfl: 3   lisinopril  (ZESTRIL ) 20 MG tablet, Take 1 tablet (20 mg total) by mouth daily., Disp: 90 tablet, Rfl: 1   LUMIGAN 0.01 % SOLN, Apply 1 drop to eye at bedtime., Disp: , Rfl:    methocarbamol  (ROBAXIN ) 500 MG tablet, Take 1 tablet (500 mg total) by mouth 3 (three) times daily as needed for muscle spasms. Take 500 mg by mouth 3 (three) times daily., Disp: 30 tablet, Rfl: 0   ondansetron  (ZOFRAN ) 4 MG  tablet, Take 1-2 tablets (4-8 mg total) by mouth every 8 (eight) hours as needed for nausea or vomiting., Disp: 30 tablet, Rfl: 0   pantoprazole  (PROTONIX ) 40 MG tablet, TAKE 1 TABLET BY MOUTH DAILY, Disp: 30 tablet, Rfl: 11   rosuvastatin  (CRESTOR ) 10 MG tablet, TAKE 1 TABLET BY MOUTH DAILY, Disp: 90 tablet, Rfl: 0   senna-docusate (SENOKOT-S) 8.6-50 MG tablet, Take 1 tablet by mouth at bedtime as needed for mild constipation., Disp: 20 tablet, Rfl: 0   SIMBRINZA 1-0.2 % SUSP, Place 1 drop into both eyes daily., Disp: , Rfl:    spironolactone  (ALDACTONE ) 25 MG tablet, Take 25 mg by mouth daily., Disp: , Rfl:    sucralfate  (CARAFATE ) 1 g tablet, TAKE 1 TABLET BY MOUTH 4 TIMES DAILY WITH MEALS AND AT BEDTIME, Disp: 120 tablet, Rfl: 0   Melatonin 10 MG CAPS, Take 10 mg by mouth at bedtime. (Patient not taking: Reported on 11/05/2024), Disp: 30 capsule, Rfl: 0  "

## 2024-11-05 NOTE — Progress Notes (Signed)
 Accompanied patient and family to initial medical oncology appointment.   Reviewed Breast Cancer treatment handbook.   Care plan summary given to patient.   Reviewed outreach programs and cancer center services.

## 2024-11-06 ENCOUNTER — Ambulatory Visit: Admitting: Surgery

## 2024-11-06 ENCOUNTER — Encounter: Payer: Self-pay | Admitting: Surgery

## 2024-11-06 VITALS — BP 138/77 | HR 62 | Temp 98.0°F | Ht 62.0 in | Wt 134.8 lb

## 2024-11-06 DIAGNOSIS — C50311 Malignant neoplasm of lower-inner quadrant of right female breast: Secondary | ICD-10-CM | POA: Diagnosis not present

## 2024-11-06 DIAGNOSIS — Z170 Estrogen receptor positive status [ER+]: Secondary | ICD-10-CM

## 2024-11-06 NOTE — Progress Notes (Unsigned)
 Patient ID: [name], female   DOB: 1941/03/02, 84 y.o.   MRN: [id number]  HPI [name] is a 84 y.o. female seen in consultation for newly diagnosed Right breast CA.  She experiences intermittent breast pain, mild, sharp and located on the left side. This is likely unrelated to her recent breast CA diagnosis.  She comes accompanied by her family including 2 daughters and 1 son. mammogram pers reviewed revealing a 1.3 cm lesion Right breast lower inner quadrant 3:30 o'clock . Biopsy confirmed invasive breast carcinoma. Imaging demonstrated no abnormal lymph nodes. Pathology showed the tumor is estrogen receptor positive (90%), progesterone receptor positive (10%), and HER2 (+).  cT1c N0 She has history significant for atrial fibrillation, paroxysmal supraventricular tachycardia, hypertension, coronary artery disease referred for anemia.  Her most recent CBC H&H of 9.5 with an MCV of 92 and a platelet count of 168  She is able to perform more than 4 METS of activity.  She does have history of A-fib and is chronically anticoagulated. 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 cath 2/09: no CAD, EF 70%   GERD (gastroesophageal reflux disease) 07/26/2021   HYPERLIPIDEMIA    HYPERTENSION    Hypertensive urgency 07/19/2022   Mild Aortic insufficiency    Moderate mitral regurgitation    Moderate tricuspid regurgitation    OSTEOPENIA    PAF (paroxysmal atrial fibrillation) (HCC)    a. s/p ablation 2013; b. CHADS2VASc -> 4 (HTN, age x 2, female)-->Eliquis .   PAT (paroxysmal atrial tachycardia)    a. 04/2019 Zio: Occas PACs and rare PVCs. 21 atrial runs - longest 20 beats, max rate 169.   Pneumonia 2009   RA (rheumatoid arthritis) (HCC)    Sinus Bradycardia    a. asymptomatic but prevents use of AVN blocking agents; b. 04/2019 Zio: Avg HR  61 (37-109).   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 05/2019 Echo: EF 60-65%. DD. RVSP 41.33mmHg. Mod dil LA. Mod MR/TR. Mild AI.    Past Surgical History:  Procedure Laterality Date   ABLATION OF DYSRHYTHMIC FOCUS     BREAST BIOPSY Right    RIGHT BREAST US  BX HYDROMARK COIL CLIP-PATH PENDING   BREAST BIOPSY Right 10/23/2024   US  RT BREAST BX W LOC DEV 1ST LESION IMG BX SPEC US  GUIDE 10/23/2024 [facility]MO[facility]   CARDIAC CATHETERIZATION     COLONOSCOPY  04/18/2008   COLONOSCOPY WITH PROPOFOL  N/A 02/28/2023   Procedure: COLONOSCOPY WITH PROPOFOL ;  Surgeon: Unk Corinn Skiff, MD;  Location: [facility] ENDOSCOPY;  Service: Gastroenterology;  Laterality: N/A;   ESOPHAGOGASTRODUODENOSCOPY (EGD) WITH PROPOFOL  N/A 02/28/2023   Procedure: ESOPHAGOGASTRODUODENOSCOPY (EGD) WITH PROPOFOL ;  Surgeon: Unk Corinn Skiff, MD;  Location: [facility] ENDOSCOPY;  Service: Gastroenterology;  Laterality: N/A;   Partial hysterectomy--1979     Thoracentesis   12/20/07      Family History  Problem Relation Age of Onset   Diabetes Other    Breast cancer Paternal Aunt 56   Heart disease Mother    Pancreatic cancer Father    Atrial fibrillation Brother    Heart disease Brother    Heart attack Brother     Social History Social History[1]  Allergies[2]  Current Outpatient Medications  Medication Sig Dispense Refill   acetaminophen  (TYLENOL ) 325 MG tablet Take  1 tablet (325 mg total) by mouth every 6 (six) hours as needed for mild pain (pain score 1-3). 90 tablet 0   amiodarone  (PACERONE ) 200 MG tablet TAKE 1/2 TABLET (100 MG TOTAL) BY MOUTH ON MONDAY, TUESDAY, THURSDAY, FRIDAY ANDSATURDAY 8 tablet 0   doxazosin  (CARDURA ) 2 MG tablet TAKE 1 TABLET BY MOUTH DAILY 90 tablet 3   ELIQUIS  2.5 MG TABS tablet TAKE 1 TABLET BY MOUTH TWICE A DAY 180 tablet 3   furosemide  (LASIX ) 20 MG tablet Take 0.5-1 tablets (10-20 mg total) by mouth daily as needed. 15 tablet 0   gabapentin  (NEURONTIN ) 100 MG  capsule Take 1 capsule (100 mg total) by mouth 2 (two) times daily. 180 capsule 1   hydrALAZINE  (APRESOLINE ) 50 MG tablet TAKE 1 TABLET BY MOUTH EVERY 8 HOURS. 270 tablet 3   lisinopril  (ZESTRIL ) 20 MG tablet Take 1 tablet (20 mg total) by mouth daily. 90 tablet 1   LUMIGAN 0.01 % SOLN Apply 1 drop to eye at bedtime.     methocarbamol  (ROBAXIN ) 500 MG tablet Take 1 tablet (500 mg total) by mouth 3 (three) times daily as needed for muscle spasms. Take 500 mg by mouth 3 (three) times daily. 30 tablet 0   ondansetron  (ZOFRAN ) 4 MG tablet Take 1-2 tablets (4-8 mg total) by mouth every 8 (eight) hours as needed for nausea or vomiting. 30 tablet 0   pantoprazole  (PROTONIX ) 40 MG tablet TAKE 1 TABLET BY MOUTH DAILY 30 tablet 11   rosuvastatin  (CRESTOR ) 10 MG tablet TAKE 1 TABLET BY MOUTH DAILY 90 tablet 0   senna-docusate (SENOKOT-S) 8.6-50 MG tablet Take 1 tablet by mouth at bedtime as needed for mild constipation. 20 tablet 0   SIMBRINZA 1-0.2 % SUSP Place 1 drop into both eyes daily.     sucralfate  (CARAFATE ) 1 g tablet TAKE 1 TABLET BY MOUTH 4 TIMES DAILY WITH MEALS AND AT BEDTIME 120 tablet 0   Melatonin 10 MG CAPS Take 10 mg by mouth at bedtime. (Patient not taking: Reported on 11/05/2024) 30 capsule 0   spironolactone  (ALDACTONE ) 25 MG tablet Take 25 mg by mouth daily.     No current facility-administered medications for this visit.     Review of Systems Full ROS  was asked and was negative except for the information on the HPI  Physical Exam Blood pressure 138/77, pulse 62, temperature 98 F (36.7 C), temperature source Oral, height 5' 2 (1.575 m), weight 134 lb 12.8 oz (61.1 kg), SpO2 95%. CONSTITUTIONAL: NAD. EYES: Pupils are equal, round, Sclera are non-icteric. EARS, NOSE, MOUTH AND THROAT: The oropharynx is clear. The oral mucosa is pink and moist. Hearing is intact to voice. LYMPH NODES:  Lymph nodes in the neck are normal. RESPIRATORY:  Lungs are clear. There is normal respiratory  effort, with equal breath sounds bilaterally, and without pathologic use of accessory muscles. CARDIOVASCULAR: Heart is regular without murmurs, gallops, or rubs. BREAST: There are no evidence of palpable lesions on either breast.  There is no evidence of lymphadenopathy.  There is no evidence of nipple or skin changes GI: The abdomen is  soft, nontender, and nondistended. There are no palpable masses. There is no hepatosplenomegaly. There are normal bowel sounds in all quadrants. GU: Rectal deferred.   MUSCULOSKELETAL: Normal muscle strength and tone. No cyanosis or edema.   SKIN: Turgor is good and there are no pathologic skin lesions or ulcers. NEUROLOGIC: Motor and sensation is grossly normal. Cranial nerves are grossly intact. PSYCH:  Oriented to person, place and time. Affect is normal.  Data Reviewed I have personally reviewed the patient's imaging, laboratory findings and medical records.    Assessment/Plan 84 year old very pleasant female with newly diagnosed right breast cancer 1.3 cm lesion Right breast lower inner quadrant 3:30 o'clock. I had an extensive discussion with patient and family regarding the treatment for breast cancer.  Specifically surgical treatment to include lumpectomy plus radiation therapy and axillary lymph node biopsy versus mastectomy.  We talked about the risk the benefits and the possible complications of each modality. One of her daughter Lizzie strong feelings about a mastectomy but the patient on itself did not.  The patient herself is choosing for breast conservation therapy which I think is very reasonable.  We will go ahead schedule her for upfront right breast lumpectomy Savi scout guided and sentinel lymph node biopsy.  She understands that we may have to do a tissue rearrangement to preserve architecture of the right breast.  She also understands the risk benefits and possible complication including the possibility of positive margins that may need  reexcision. Will also place port per oncology request.  Extensive counseling provided We will also hold anticoagulation for 2 days.  She understands that she does have chronic anemia and there is a risk of bleeding and the need for blood products I personally spent a total of 60 minutes in the care of the patient today including performing a medically appropriate exam/evaluation, counseling and educating, placing orders, referring and communicating with other health care professionals, documenting clinical information in the EHR, independently interpreting and reviewing images studies and coordinating care.    Laneta Luna, MD FACS General Surgeon 11/07/2024, 7:39 AM      [1]  Social History Tobacco Use   Smoking status: Never   Smokeless tobacco: Never  Vaping Use   Vaping status: Never Used  Substance Use Topics   Alcohol use: No    Alcohol/week: 0.0 standard drinks of alcohol   Drug use: No  [2]  Allergies Allergen Reactions   Amlodipine      Ankle swelling   Celecoxib Other (See Comments) and Rash    Tachycardia/palpitations Other reaction(s): Other Tachycardia/palpitations Tachycardia/palpitations   Tramadol  Nausea Only

## 2024-11-06 NOTE — Patient Instructions (Signed)
 We have spoken today about removing a lump in your breast. This will be done by Dr. Marinda at [facility].  If you are on any injectable weight loss medication, you will need to stop taking your GLP-1 injectable (weight loss) medications 8 days before your surgery to avoid any complications with anesthesia.   You will most likely be able to leave the hospital several hours after your surgery. Rarely, a patient needs to stay over night but this is a possibility.  Plan to tentatively be off work for 1-2 weeks following the surgery and may return with approximately 2 more weeks of a lifting restriction, no greater than 15 lbs.  Please see your Blue surgery sheet for more information. Our surgery scheduler will call you to look at surgery dates and to go over information.   If you have FMLA or Disability paperwork that needs to be filled out, please have your company fax your paperwork to [phone] or you may drop this by either office. This paperwork will be filled out within 3 days after your surgery has been completed.    Lumpectomy A lumpectomy is a form of breast conserving or breast preservation surgery. It may also be referred to as a partial mastectomy. During a lumpectomy, the portion of the breast that contains the cancerous tumor or breast mass (the lump) is removed. Some normal tissue around the lump may also be removed to make sure all of the tumor has been removed.  LET [facility] CARE PROVIDER KNOW ABOUT: Any allergies you have. All medicines you are taking, including vitamins, herbs, eye drops, creams, and over-the-counter medicines. Previous problems you or members of your family have had with the use of anesthetics. Any blood disorders you have. Previous surgeries you have had. Medical conditions you have. RISKS AND COMPLICATIONS Generally, this is a safe procedure. However, problems can occur and include: Bleeding. Infection. Pain. Temporary swelling. Change in the  shape of the breast, particularly if a large portion is removed. BEFORE THE PROCEDURE Ask your health care provider about changing or stopping your regular medicines. This is especially important if you are taking diabetes medicines or blood thinners. Do not eat or drink anything after midnight on the night before the procedure or as directed by your health care provider. Ask your health care provider if you can take a sip of water with any approved medicines. On the day of surgery, your health care provider will use a mammogram or ultrasound to locate and mark the tumor in your breast. These markings on your breast will show where the cut (incision) will be made.   PROCEDURE  An IV tube will be put into one of your veins. You may be given medicine to help you relax before the surgery (sedative). You will be given one of the following: A medicine that numbs the area (local anesthetic). A medicine that makes you fall asleep (general anesthetic). Your health care provider will use a kind of electric scalpel that uses heat to minimize bleeding (electrocautery knife). A curved incision (like a smile or frown) that follows the natural curve of your breast is made, to allow for minimal scarring and better healing. The tumor will be removed with some of the surrounding tissue. This will be sent to the lab for analysis. Your health care provider may also remove your lymph nodes at this time if needed. Sometimes, but not always, a rubber tube called a drain will be surgically inserted into your breast area or  armpit to collect excess fluid that may accumulate in the space where the tumor was. This drain is connected to a plastic bulb on the outside of your body. This drain creates suction to help remove the fluid. The incisions will be closed with stitches (sutures). A bandage may be placed over the incisions. AFTER THE PROCEDURE You will be taken to the recovery area. You will be given medicine for  pain. A small rubber drain may be placed in the breast for 2-3 days to prevent a collection of blood (hematoma) from developing in the breast. You will be given instructions on caring for the drain before you go home. A pressure bandage (dressing) will be applied for 1-2 days to prevent bleeding. Ask your health care provider how to care for your bandage at home.   This information is not intended to replace advice given to you by your health care provider. Make sure you discuss any questions you have with your health care provider.   Document Released: 11/07/2006 Document Revised: 10/17/2014 Document Reviewed: 03/01/2013 Elsevier Interactive Patient Education [employer]

## 2024-11-07 ENCOUNTER — Other Ambulatory Visit: Payer: Self-pay | Admitting: Surgery

## 2024-11-07 ENCOUNTER — Telehealth: Payer: Self-pay | Admitting: Surgery

## 2024-11-07 DIAGNOSIS — C50919 Malignant neoplasm of unspecified site of unspecified female breast: Secondary | ICD-10-CM

## 2024-11-07 NOTE — Telephone Encounter (Signed)
 Patient has been advised of Pre-Admission date/time, and Surgery date at [facility].  Surgery Date: 11/19/24 Preadmission Testing Date: 11/11/24 (phone 1p-4p)  Patient and daughter, made made aware to arrive at 10:45 am on 11/19/24 as will have SLN bx prior to surgery.

## 2024-11-11 ENCOUNTER — Inpatient Hospital Stay
Admission: RE | Admit: 2024-11-11 | Discharge: 2024-11-11 | Disposition: A | Source: Ambulatory Visit | Attending: Surgery

## 2024-11-11 ENCOUNTER — Other Ambulatory Visit: Payer: Self-pay

## 2024-11-11 HISTORY — DX: Chronic diastolic (congestive) heart failure: I50.32

## 2024-11-11 HISTORY — DX: Anemia in chronic kidney disease: D63.1

## 2024-11-11 HISTORY — DX: Thrombocytopenia, unspecified: D69.6

## 2024-11-11 HISTORY — DX: Long term (current) use of anticoagulants: Z79.01

## 2024-11-11 HISTORY — DX: Malignant neoplasm of lower-inner quadrant of right female breast: C50.311

## 2024-11-11 HISTORY — DX: Secondary hyperparathyroidism of renal origin: N25.81

## 2024-11-11 HISTORY — DX: Other specified conditions associated with female genital organs and menstrual cycle: N94.89

## 2024-11-11 HISTORY — DX: Polyp of colon: K63.5

## 2024-11-11 HISTORY — DX: Radiculopathy, lumbar region: M54.16

## 2024-11-11 HISTORY — DX: Occlusion and stenosis of bilateral carotid arteries: I65.23

## 2024-11-11 HISTORY — DX: Spinal stenosis, lumbar region without neurogenic claudication: M48.061

## 2024-11-11 HISTORY — DX: Chronic kidney disease, stage 4 (severe): N18.4

## 2024-11-11 HISTORY — DX: Personal history of other diseases of the circulatory system: Z86.79

## 2024-11-12 ENCOUNTER — Other Ambulatory Visit: Payer: Self-pay | Admitting: Surgery

## 2024-11-12 DIAGNOSIS — C50919 Malignant neoplasm of unspecified site of unspecified female breast: Secondary | ICD-10-CM

## 2024-11-13 ENCOUNTER — Inpatient Hospital Stay: Attending: Oncology | Admitting: Occupational Therapy

## 2024-11-13 ENCOUNTER — Encounter: Payer: Self-pay | Admitting: Occupational Therapy

## 2024-11-13 ENCOUNTER — Ambulatory Visit
Admission: RE | Admit: 2024-11-13 | Discharge: 2024-11-13 | Disposition: A | Source: Ambulatory Visit | Attending: Surgery

## 2024-11-13 ENCOUNTER — Inpatient Hospital Stay
Admission: RE | Admit: 2024-11-13 | Discharge: 2024-11-13 | Disposition: A | Source: Ambulatory Visit | Attending: Surgery

## 2024-11-13 DIAGNOSIS — C50919 Malignant neoplasm of unspecified site of unspecified female breast: Secondary | ICD-10-CM

## 2024-11-13 DIAGNOSIS — M25611 Stiffness of right shoulder, not elsewhere classified: Secondary | ICD-10-CM

## 2024-11-13 MED ORDER — LIDOCAINE HCL 1 % IJ SOLN
5.0000 mL | Freq: Once | INTRAMUSCULAR | Status: AC
  Administered 2024-11-13: 5 mL
  Filled 2024-11-13: qty 5

## 2024-11-13 NOTE — Therapy (Signed)
 " OUTPATIENT OCCUPATIONAL THERAPY BREAST CANCER BASELINE EVALUATION   Patient Name: [name] MRN: [id number] DOB:10/17/1940, 84 y.o., female Today's Date: 11/13/2024  END OF SESSION:  OT End of Session - 11/13/24 1735     Visit Number 1    Number of Visits 4    Date for Recertification  02/05/25    OT Start Time 1300    OT Stop Time 1333    OT Time Calculation (min) 33 min    Activity Tolerance Patient tolerated treatment well    Behavior During Therapy [facility] for tasks assessed/performed          Past Medical History:  Diagnosis Date   Adnexal mass    Anemia of chronic renal failure    Anticoagulated on apixaban     Aortic valve sclerosis 08/19/2010   Qualifier: Diagnosis of  By: Lelon RIGGERS, [name]     Carotid artery calcification, bilateral    Chronic diastolic CHF (congestive heart failure) (HCC)    CKD (chronic kidney disease) stage 4, GFR 15-29 ml/min (HCC)    Colon polyp    Coronary artery disease, non-occlusive    a. 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 s/p ablation 2013; b. CHADS2VASc -> 4 (HTN, age x 2, female)-->Eliquis .   PAT (paroxysmal atrial tachycardia)    a. 04/2019 Zio: Occas PACs and rare PVCs. 21 atrial runs - longest 20 beats, max rate 169.   Pneumonia 2009   RA (rheumatoid arthritis) (HCC)    S/P ablation of atrial fibrillation    Secondary hyperparathyroidism of renal origin    Sinus Bradycardia    a. asymptomatic but prevents use of AVN blocking agents; b. 04/2019 Zio: Avg HR 61 (37-109).   Thrombocytopenia     Unspecified glaucoma(365.9)    Valvular heart disease    a. 05/2019 Echo: EF 60-65%. DD. RVSP 41.12mmHg. Mod dil LA. Mod MR/TR. Mild AI.   Past Surgical History:  Procedure Laterality Date   ABLATION OF DYSRHYTHMIC FOCUS     BREAST BIOPSY Right    RIGHT BREAST US  BX HYDROMARK COIL CLIP-PATH PENDING   BREAST BIOPSY Right 10/23/2024   US  RT BREAST BX W LOC DEV 1ST LESION IMG BX SPEC US  GUIDE 10/23/2024 [facility]   BREAST BIOPSY Right 11/13/2024   US  RT BREAST SAVI/RF TAG 1ST LESION US  GUIDE 11/13/2024 [facility]   CARDIAC CATHETERIZATION     CATARACT EXTRACTION, BILATERAL Bilateral    COLONOSCOPY  04/18/2008   COLONOSCOPY WITH PROPOFOL  N/A 02/28/2023   Procedure: COLONOSCOPY WITH PROPOFOL ;  Surgeon: Unk Corinn Skiff, MD;  Location: [facility];  Service: Gastroenterology;  Laterality: N/A;   ESOPHAGOGASTRODUODENOSCOPY (EGD) WITH PROPOFOL  N/A 02/28/2023   Procedure: ESOPHAGOGASTRODUODENOSCOPY (EGD) WITH PROPOFOL ;  Surgeon: Unk Corinn Skiff, MD;  Location: [facility];  Service: Gastroenterology;  Laterality: N/A;   Partial hysterectomy--1979     Thoracentesis   12/20/07     Patient Active Problem List   Diagnosis Date Noted   Malignant neoplasm of lower-inner quadrant of right breast of female, estrogen receptor positive (HCC)  11/05/2024   Complicated UTI (urinary tract infection) 08/25/2024   Secondary hyperparathyroidism of renal origin 07/31/2024   Proteinuria 07/31/2024   Thrombocytopenia 06/19/2024   Degenerative lumbar spinal stenosis 02/27/2024   Lumbar radiculopathy 02/05/2024   CKD (chronic kidney disease) stage 4, GFR 15-29 ml/min (HCC) 08/17/2023   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   Adenomatous polyp of transverse colon 02/28/2023   Colon cancer screening 02/28/2023   Acute kidney injury superimposed on chronic kidney disease 03/22/2022   Chronic nausea 03/03/2022   Carotid artery calcification, bilateral 12/21/2021   Osteoarthritis of cervical spine 12/21/2021    Chronic diastolic CHF (congestive heart failure) (HCC) 08/17/2021   Breast pain 08/17/2021   Adnexal mass 08/04/2021   Anemia of chronic renal failure, stage 3b (HCC) 08/03/2021   GERD (gastroesophageal reflux disease) 07/26/2021   Left hip pain 04/20/2021   Moderate dementia (HCC) 11/10/2020   Sinus bradycardia 07/18/2019   Valvular heart disease 07/18/2019   PSVT (paroxysmal supraventricular tachycardia) 05/16/2019   Persistent atrial fibrillation (HCC) 07/19/2018   Peripheral edema 02/02/2018   Coronary artery disease, non-occlusive 09/14/2017   Status post ablation of atrial fibrillation 09/14/2017   Essential hypertension 09/14/2017   Tinea pedis 07/14/2017   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/15/2016   History of rheumatoid arthritis 03/14/2016   Mitral regurgitation 12/30/2015   Aortic valve insufficiency 12/02/2015   Vitamin D  deficiency 04/17/2015   Allergic rhinitis 02/03/2015   Chronic left-sided low back pain with sciatica 08/28/2012   Left groin pain 05/01/2012   Atypical chest pain 03/02/2011   Aortic valve sclerosis 08/19/2010   Osteopenia 05/11/2010   SINUS BRADYCARDIA 08/19/2009   Anemia of chronic disease 12/13/2007   HYPERLIPIDEMIA 08/23/2007   UNSPECIFIED GLAUCOMA 10/10/2006    PCP: Dr Avelina  REFERRING PROVIDER: Dr Melanee MART DIAG: R breast cancer   THERAPY DIAG:  Shoulder joint stiffness, bilateral  Rationale for Evaluation and Treatment: Rehabilitation  ONSET DATE: 10/18/24  SUBJECTIVE:                                                                                                                                                                                           SUBJECTIVE STATEMENT: Patient reports she is here today after being refer by one of her medical team for her newly diagnosed right breast cancer.   PERTINENT HISTORY:  Patient was diagnosed with right  breast cancer - plan is to have R lumpectomy by Dr Jordis on  11/19/24.   PATIENT GOALS:   reduce lymphedema risk and learn post op HEP.   PAIN:  Are you having pain? Soreness over head ROM bilateral shoulders  PRECAUTIONS:  Active CA , lymphedema R UE postop    HAND DOMINANCE: right  WEIGHT BEARING RESTRICTIONS: No  FALLS:  Has patient fallen in last 6 months? No  LIVING ENVIRONMENT: Patient lives with: Husband  OCCUPATION and LEISURE: Patient very sedentary.  Watches TV.  Husband does most of the cooking and housework.   OBJECTIVE:  COGNITION: Overall cognitive status: Within functional limits for tasks assessed and History of cognitive impairments - at baseline    POSTURE:  Forward head and rounded shoulders posture  UPPER EXTREMITY AROM/PROM:  Bilateral active range of motion within functional limits.  But limited or impaired for endrange.  With some discomfort and soreness at bilateral shoulders Including external rotation and internal rotation. CERVICAL AROM: All within normal limits:     UPPER EXTREMITY STRENGTH: 4/5 for bilateral shoulder strength  LYMPHEDEMA ASSESSMENTS:     L-DEX LYMPHEDEMA SCREENING:  The patient was assessed using the L-Dex machine today to produce a lymphedema index baseline score. The patient will be reassessed on a regular basis (typically every 3 months) to obtain new L-Dex scores. If the score is > 6.5 points away from his/her baseline score indicating onset of subclinical lymphedema, it will be recommended to wear a compression garment for 4 weeks, 12 hours per day and then be reassessed. If the score continues to be > 6.5 points from baseline at reassessment, we will initiate lymphedema treatment. Assessing in this manner has a 95% rate of preventing clinically significant lymphedema.   L-DEX FLOWSHEETS - 11/13/24 1700       L-DEX LYMPHEDEMA SCREENING   Measurement Type Unilateral    L-DEX MEASUREMENT EXTREMITY Upper Extremity    POSITION  Standing    DOMINANT SIDE Right    At Risk  Side Right    BASELINE SCORE (UNILATERAL) 2.3           PATIENT EDUCATION:  Education details: Lymphedema risk reduction initiated and post op shoulder/posture HEP Person educated: Patient and husband Education method: Explanation, Demonstration, Handout Education comprehension: Patient verbalized understanding and returned demonstration  HOME EXERCISE PROGRAM: Reviewed active assisted range of motion in supine for shoulder flexion and abduction using the wand.  For the first couple of weeks we will go up to 90 degrees if decrease discomfort or pain can increase overhead 3 times a day 12 reps In supine external rotation with gravity right upper extremity 12 reps 3 times a day and can add left at the same time if symptom-free 3-5 times a day scapular retraction 12 reps  ASSESSMENT:  CLINICAL IMPRESSION: Her multidisciplinary medical team has met to assess and determine a recommended treatment plan. She is planning to have right lumpectomy by Dr. Jordis on on 11/19/2024.  Patient and husband present at OT evaluation.  Patient limited in endrange shoulder flexion and abduction as well as decreased strength.  Patient sedentary.  Patient and husband was educated and postop range of motion exercises and initiate lymphedema education.  Patient will follow-up with me about 3 weeks postop.  She will benefit from a post op OT reassessment to determine needs and from L-Dex screens every 3 months for 2 years to detect subclinical lymphedema.  Pt will benefit from skilled therapeutic intervention to improve on the following deficits: Decreased knowledge of precautions and lymphedema education, impaired UE functional use, pain, decreased ROM, postural dysfunction.   OT treatment/interventions: ADL/self-care home management, pt/family education, therapeutic exercise,manual therapy  REHAB POTENTIAL: Good  CLINICAL DECISION MAKING: Stable/uncomplicated  EVALUATION COMPLEXITY: Low  GOALS: Goals  reviewed with patient? YES  LONG TERM GOALS: (STG=LTG)    Name Target Date Goal status  1 Pt will be able to verbalize understanding of pertinent lymphedema risk reduction practices relevant to her dx specifically related to skin care.  Baseline:  No knowledge 02/05/25 Achieved at eval  2 Pt will be able to return demo and/or verbalize understanding of the post op HEP related to regaining shoulder ROM. Baseline:  No knowledge Today Achieved at eval       4 Pt will demo she has regained full shoulder ROM and function post operatively compared to baselines.  Baseline: See objective measurements taken today. 02/05/25 Initial    PLAN:  OT FREQUENCY/DURATION: EVAL and 3 follow up appointment.   PLAN FOR NEXT SESSION: will reassess 3  weeks post op to determine needs.    Occupational Therapy Information for After Breast Cancer Surgery/Treatment:  Lymphedema is a swelling condition that you may be at risk for in your arm if you have lymph nodes removed from the armpit area.  After a sentinel node biopsy, the risk is approximately 5-9% and is higher after an axillary node dissection.  There is treatment available for this condition and it is not life-threatening.  Contact your physician or occupational therapist with concerns. You may begin the 4 shoulder/posture exercises (see additional sheet) when permitted by your physician (typically a week after surgery).  If you have drains, you may need to wait until those are removed before beginning range of motion exercises.  A general recommendation is to not lift your arms above shoulder height until drains are removed.  These exercises should be done to your tolerance and gently.  This is not a no pain/no gain type of recovery so listen to your body and stretch into the range of motion that you can tolerate, stopping if you have pain.  If you are having immediate reconstruction, ask your plastic surgeon about doing exercises as he or she may want you to  wait. .  While undergoing any medical procedure or treatment, try to avoid blood pressure being taken or needle sticks from occurring on the arm on the side of cancer.   This recommendation begins after surgery and continues for the rest of your life.  This may help reduce your risk of getting lymphedema (swelling in your arm). An excellent resource for those seeking information on lymphedema is the [employer]'s web site. It can be accessed at www.lymphnet.org If you notice swelling in your hand, arm or breast at any time following surgery (even if it is many years from now), please contact your doctor or occupational therapist to discuss this.  Lymphedema can be treated at any time but it is easier for you if it is treated early on.  If you feel like your shoulder motion is not returning to normal in a reasonable amount of time, please contact your surgeon or occupational therapist.  [facility] [phone]. [address] [place], [place] [address]       Ancel Peters, OTR/L,CLT 11/13/2024, 5:40 PM   "

## 2024-11-14 ENCOUNTER — Ambulatory Visit: Admission: RE | Admit: 2024-11-14 | Discharge: 2024-11-14 | Attending: Oncology

## 2024-11-14 DIAGNOSIS — Z170 Estrogen receptor positive status [ER+]: Secondary | ICD-10-CM

## 2024-11-14 DIAGNOSIS — Z0189 Encounter for other specified special examinations: Secondary | ICD-10-CM | POA: Diagnosis not present

## 2024-11-14 DIAGNOSIS — C50311 Malignant neoplasm of lower-inner quadrant of right female breast: Secondary | ICD-10-CM

## 2024-11-14 LAB — ECHOCARDIOGRAM COMPLETE
AR max vel: 1.27 cm2
AV Area VTI: 1.16 cm2
AV Area mean vel: 1.18 cm2
AV Mean grad: 14.2 mmHg
AV Peak grad: 27.5 mmHg
Ao pk vel: 2.62 m/s
Area-P 1/2: 3.27 cm2
MV M vel: 5.05 m/s
MV Peak grad: 102 mmHg
P 1/2 time: 682 ms
S' Lateral: 2.9 cm

## 2024-11-15 ENCOUNTER — Ambulatory Visit

## 2024-11-19 ENCOUNTER — Encounter: Payer: Self-pay | Admitting: Urgent Care

## 2024-11-19 ENCOUNTER — Ambulatory Visit: Admission: RE | Admit: 2024-11-19 | Source: Home / Self Care | Admitting: Surgery

## 2024-11-19 ENCOUNTER — Encounter

## 2024-11-19 ENCOUNTER — Encounter: Admission: RE | Payer: Self-pay | Source: Home / Self Care

## 2024-11-19 ENCOUNTER — Ambulatory Visit

## 2024-11-26 ENCOUNTER — Inpatient Hospital Stay: Admitting: Licensed Clinical Social Worker

## 2024-11-26 ENCOUNTER — Inpatient Hospital Stay

## 2024-11-26 ENCOUNTER — Inpatient Hospital Stay: Admitting: Oncology

## 2024-12-11 ENCOUNTER — Inpatient Hospital Stay: Admitting: Occupational Therapy

## 2024-12-25 ENCOUNTER — Inpatient Hospital Stay

## 2025-01-07 ENCOUNTER — Ambulatory Visit: Admitting: Family Medicine

## 2025-02-14 ENCOUNTER — Other Ambulatory Visit

## 2025-02-14 ENCOUNTER — Ambulatory Visit: Admitting: Oncology

## 2025-02-24 ENCOUNTER — Institutional Professional Consult (permissible substitution): Admitting: Diagnostic Neuroimaging

## 2025-02-25 ENCOUNTER — Inpatient Hospital Stay: Admitting: Oncology

## 2025-02-25 ENCOUNTER — Inpatient Hospital Stay
